# Patient Record
Sex: Female | Born: 1960 | Race: Black or African American | Hispanic: No | Marital: Single | Smoking: Former smoker
Health system: Southern US, Community
[De-identification: ages and names within clinical notes are randomized; demographics above are authoritative.]

## PROBLEM LIST (undated history)

## (undated) DIAGNOSIS — I10 Essential (primary) hypertension: Secondary | ICD-10-CM

## (undated) DIAGNOSIS — R000 Tachycardia, unspecified: Secondary | ICD-10-CM

## (undated) DIAGNOSIS — N3000 Acute cystitis without hematuria: Secondary | ICD-10-CM

## (undated) DIAGNOSIS — K219 Gastro-esophageal reflux disease without esophagitis: Secondary | ICD-10-CM

## (undated) DIAGNOSIS — G9341 Metabolic encephalopathy: Secondary | ICD-10-CM

## (undated) DIAGNOSIS — N390 Urinary tract infection, site not specified: Secondary | ICD-10-CM

## (undated) DIAGNOSIS — M1990 Unspecified osteoarthritis, unspecified site: Secondary | ICD-10-CM

## (undated) DIAGNOSIS — G629 Polyneuropathy, unspecified: Secondary | ICD-10-CM

## (undated) DIAGNOSIS — E7800 Pure hypercholesterolemia, unspecified: Secondary | ICD-10-CM

## (undated) DIAGNOSIS — K589 Irritable bowel syndrome without diarrhea: Secondary | ICD-10-CM

## (undated) DIAGNOSIS — M797 Fibromyalgia: Secondary | ICD-10-CM

## (undated) DIAGNOSIS — N319 Neuromuscular dysfunction of bladder, unspecified: Secondary | ICD-10-CM

## (undated) DIAGNOSIS — E559 Vitamin D deficiency, unspecified: Secondary | ICD-10-CM

## (undated) DIAGNOSIS — G35 Multiple sclerosis: Secondary | ICD-10-CM

## (undated) DIAGNOSIS — Z8614 Personal history of Methicillin resistant Staphylococcus aureus infection: Secondary | ICD-10-CM

## (undated) DIAGNOSIS — G35D Multiple sclerosis, unspecified: Secondary | ICD-10-CM

## (undated) DIAGNOSIS — B1920 Unspecified viral hepatitis C without hepatic coma: Secondary | ICD-10-CM

## (undated) DIAGNOSIS — G8929 Other chronic pain: Secondary | ICD-10-CM

## (undated) DIAGNOSIS — E669 Obesity, unspecified: Secondary | ICD-10-CM

## (undated) DIAGNOSIS — I639 Cerebral infarction, unspecified: Secondary | ICD-10-CM

## (undated) DIAGNOSIS — D649 Anemia, unspecified: Secondary | ICD-10-CM

## (undated) DIAGNOSIS — Z9682 Presence of neurostimulator: Secondary | ICD-10-CM

## (undated) HISTORY — PX: JOINT REPLACEMENT: SHX530

## (undated) HISTORY — PX: SACRAL NERVE STIMULATOR PLACEMENT: SHX2367

## (undated) HISTORY — PX: ABDOMINAL HYSTERECTOMY: SHX81

## (undated) HISTORY — PX: TUBAL LIGATION: SHX77

---

## 1999-05-26 ENCOUNTER — Encounter: Payer: Self-pay | Admitting: Emergency Medicine

## 1999-05-26 ENCOUNTER — Emergency Department (HOSPITAL_COMMUNITY): Admission: EM | Admit: 1999-05-26 | Discharge: 1999-05-26 | Payer: Self-pay | Admitting: Emergency Medicine

## 1999-06-10 ENCOUNTER — Encounter: Admission: RE | Admit: 1999-06-10 | Discharge: 1999-06-18 | Payer: Self-pay | Admitting: *Deleted

## 2007-10-23 ENCOUNTER — Inpatient Hospital Stay (HOSPITAL_COMMUNITY): Admission: EM | Admit: 2007-10-23 | Discharge: 2007-10-27 | Payer: Self-pay | Admitting: Emergency Medicine

## 2007-10-23 ENCOUNTER — Ambulatory Visit: Payer: Self-pay | Admitting: Infectious Disease

## 2007-11-04 ENCOUNTER — Encounter: Payer: Self-pay | Admitting: Internal Medicine

## 2007-11-08 ENCOUNTER — Encounter: Payer: Self-pay | Admitting: Internal Medicine

## 2007-11-08 ENCOUNTER — Ambulatory Visit: Payer: Self-pay | Admitting: Internal Medicine

## 2007-11-08 DIAGNOSIS — I10 Essential (primary) hypertension: Secondary | ICD-10-CM | POA: Insufficient documentation

## 2007-11-08 LAB — CONVERTED CEMR LAB
ALT: 34 units/L (ref 0–35)
AST: 27 units/L (ref 0–37)
Alkaline Phosphatase: 48 units/L (ref 39–117)
Creatinine, Ser: 0.76 mg/dL (ref 0.40–1.20)
Sodium: 139 meq/L (ref 135–145)
Total Bilirubin: 0.5 mg/dL (ref 0.3–1.2)
Total Protein: 6.9 g/dL (ref 6.0–8.3)

## 2007-11-09 ENCOUNTER — Telehealth: Payer: Self-pay | Admitting: Internal Medicine

## 2007-11-17 ENCOUNTER — Ambulatory Visit (HOSPITAL_COMMUNITY): Admission: RE | Admit: 2007-11-17 | Discharge: 2007-11-17 | Payer: Self-pay | Admitting: Infectious Diseases

## 2007-11-17 ENCOUNTER — Ambulatory Visit: Payer: Self-pay | Admitting: Infectious Diseases

## 2007-11-17 ENCOUNTER — Encounter (INDEPENDENT_AMBULATORY_CARE_PROVIDER_SITE_OTHER): Payer: Self-pay | Admitting: Internal Medicine

## 2007-11-17 ENCOUNTER — Telehealth: Payer: Self-pay | Admitting: *Deleted

## 2007-11-17 DIAGNOSIS — R000 Tachycardia, unspecified: Secondary | ICD-10-CM | POA: Insufficient documentation

## 2007-11-17 LAB — CONVERTED CEMR LAB
Basophils Relative: 0 % (ref 0–1)
Eosinophils Absolute: 0.1 10*3/uL (ref 0.0–0.7)
Eosinophils Relative: 1 % (ref 0–5)
HCT: 42.5 % (ref 36.0–46.0)
Lymphs Abs: 1.6 10*3/uL (ref 0.7–4.0)
MCHC: 33.4 g/dL (ref 30.0–36.0)
MCV: 88.5 fL (ref 78.0–100.0)
Platelets: 244 10*3/uL (ref 150–400)
RDW: 12.8 % (ref 11.5–15.5)
TSH: 0.427 microintl units/mL (ref 0.350–4.50)
WBC: 5.6 10*3/uL (ref 4.0–10.5)

## 2007-12-09 ENCOUNTER — Encounter: Payer: Self-pay | Admitting: Internal Medicine

## 2007-12-09 ENCOUNTER — Ambulatory Visit: Payer: Self-pay | Admitting: Internal Medicine

## 2007-12-09 DIAGNOSIS — G35 Multiple sclerosis: Secondary | ICD-10-CM | POA: Insufficient documentation

## 2007-12-09 DIAGNOSIS — R339 Retention of urine, unspecified: Secondary | ICD-10-CM | POA: Insufficient documentation

## 2007-12-09 LAB — CONVERTED CEMR LAB
CO2: 21 meq/L (ref 19–32)
Calcium: 9.2 mg/dL (ref 8.4–10.5)
Creatinine, Ser: 0.73 mg/dL (ref 0.40–1.20)

## 2007-12-12 ENCOUNTER — Encounter: Payer: Self-pay | Admitting: Internal Medicine

## 2007-12-15 ENCOUNTER — Telehealth: Payer: Self-pay | Admitting: Internal Medicine

## 2007-12-19 ENCOUNTER — Telehealth: Payer: Self-pay | Admitting: Infectious Diseases

## 2007-12-20 ENCOUNTER — Ambulatory Visit: Payer: Self-pay | Admitting: *Deleted

## 2007-12-20 ENCOUNTER — Encounter: Payer: Self-pay | Admitting: Internal Medicine

## 2007-12-20 LAB — CONVERTED CEMR LAB
Bilirubin Urine: NEGATIVE
Ketones, urine, test strip: NEGATIVE
Urobilinogen, UA: 0.2
pH: 5

## 2007-12-31 ENCOUNTER — Emergency Department (HOSPITAL_COMMUNITY): Admission: EM | Admit: 2007-12-31 | Discharge: 2007-12-31 | Payer: Self-pay | Admitting: Emergency Medicine

## 2008-01-09 ENCOUNTER — Encounter: Payer: Self-pay | Admitting: Internal Medicine

## 2008-01-10 ENCOUNTER — Ambulatory Visit: Payer: Self-pay | Admitting: Internal Medicine

## 2008-01-10 DIAGNOSIS — K219 Gastro-esophageal reflux disease without esophagitis: Secondary | ICD-10-CM | POA: Insufficient documentation

## 2008-01-12 ENCOUNTER — Encounter (INDEPENDENT_AMBULATORY_CARE_PROVIDER_SITE_OTHER): Payer: Self-pay | Admitting: *Deleted

## 2008-01-12 ENCOUNTER — Ambulatory Visit (HOSPITAL_COMMUNITY): Admission: RE | Admit: 2008-01-12 | Discharge: 2008-01-12 | Payer: Self-pay | Admitting: *Deleted

## 2008-01-15 ENCOUNTER — Emergency Department (HOSPITAL_COMMUNITY): Admission: EM | Admit: 2008-01-15 | Discharge: 2008-01-16 | Payer: Self-pay | Admitting: Family Medicine

## 2008-01-16 ENCOUNTER — Telehealth: Payer: Self-pay | Admitting: Internal Medicine

## 2008-01-18 ENCOUNTER — Telehealth: Payer: Self-pay | Admitting: *Deleted

## 2008-01-20 ENCOUNTER — Ambulatory Visit (HOSPITAL_COMMUNITY): Admission: RE | Admit: 2008-01-20 | Discharge: 2008-01-20 | Payer: Self-pay | Admitting: Neurology

## 2008-01-20 ENCOUNTER — Ambulatory Visit: Payer: Self-pay | Admitting: Vascular Surgery

## 2008-01-20 ENCOUNTER — Encounter (INDEPENDENT_AMBULATORY_CARE_PROVIDER_SITE_OTHER): Payer: Self-pay | Admitting: Neurology

## 2008-01-24 ENCOUNTER — Encounter: Payer: Self-pay | Admitting: Internal Medicine

## 2008-01-27 ENCOUNTER — Telehealth: Payer: Self-pay | Admitting: Internal Medicine

## 2008-01-31 ENCOUNTER — Telehealth (INDEPENDENT_AMBULATORY_CARE_PROVIDER_SITE_OTHER): Payer: Self-pay | Admitting: *Deleted

## 2008-02-01 ENCOUNTER — Ambulatory Visit: Payer: Self-pay | Admitting: Internal Medicine

## 2008-02-21 ENCOUNTER — Telehealth: Payer: Self-pay | Admitting: Internal Medicine

## 2008-02-24 ENCOUNTER — Ambulatory Visit: Payer: Self-pay | Admitting: Infectious Disease

## 2008-02-24 ENCOUNTER — Encounter: Payer: Self-pay | Admitting: Internal Medicine

## 2008-03-01 ENCOUNTER — Ambulatory Visit (HOSPITAL_COMMUNITY): Admission: RE | Admit: 2008-03-01 | Discharge: 2008-03-01 | Payer: Self-pay | Admitting: Internal Medicine

## 2008-03-06 LAB — CONVERTED CEMR LAB
Basophils Absolute: 0 10*3/uL (ref 0.0–0.1)
Basophils Relative: 0 % (ref 0–1)
Chloride: 105 meq/L (ref 96–112)
MCHC: 35.6 g/dL (ref 30.0–36.0)
Neutro Abs: 1.2 10*3/uL — ABNORMAL LOW (ref 1.7–7.7)
Neutrophils Relative %: 30 % — ABNORMAL LOW (ref 43–77)
Potassium: 3.8 meq/L (ref 3.5–5.3)
RBC: 5.03 M/uL (ref 3.87–5.11)
RDW: 12.4 % (ref 11.5–15.5)

## 2008-03-26 ENCOUNTER — Telehealth: Payer: Self-pay | Admitting: Internal Medicine

## 2008-03-26 ENCOUNTER — Ambulatory Visit: Payer: Self-pay | Admitting: *Deleted

## 2008-03-26 ENCOUNTER — Encounter: Payer: Self-pay | Admitting: Internal Medicine

## 2008-03-26 LAB — CONVERTED CEMR LAB
HDL: 68 mg/dL (ref >39)
LDL Cholesterol: 116 mg/dL — ABNORMAL HIGH (ref 0–99)
Triglycerides: 216 mg/dL — ABNORMAL HIGH (ref <150)
VLDL: 43 mg/dL — ABNORMAL HIGH (ref 0–40)

## 2008-03-30 ENCOUNTER — Encounter: Payer: Self-pay | Admitting: Internal Medicine

## 2008-05-22 ENCOUNTER — Encounter (INDEPENDENT_AMBULATORY_CARE_PROVIDER_SITE_OTHER): Payer: Self-pay | Admitting: Internal Medicine

## 2008-05-22 ENCOUNTER — Ambulatory Visit: Payer: Self-pay | Admitting: Internal Medicine

## 2008-05-22 ENCOUNTER — Encounter: Payer: Self-pay | Admitting: Internal Medicine

## 2008-05-22 DIAGNOSIS — G8929 Other chronic pain: Secondary | ICD-10-CM | POA: Insufficient documentation

## 2008-05-22 DIAGNOSIS — H538 Other visual disturbances: Secondary | ICD-10-CM | POA: Insufficient documentation

## 2008-05-22 LAB — CONVERTED CEMR LAB
Calcium: 9.8 mg/dL (ref 8.4–10.5)
Creatinine, Ser: 0.75 mg/dL (ref 0.40–1.20)

## 2008-05-26 ENCOUNTER — Telehealth (INDEPENDENT_AMBULATORY_CARE_PROVIDER_SITE_OTHER): Payer: Self-pay | Admitting: Internal Medicine

## 2008-06-07 ENCOUNTER — Telehealth: Payer: Self-pay | Admitting: Internal Medicine

## 2008-06-08 ENCOUNTER — Emergency Department (HOSPITAL_COMMUNITY): Admission: EM | Admit: 2008-06-08 | Discharge: 2008-06-08 | Payer: Self-pay | Admitting: Emergency Medicine

## 2008-06-18 ENCOUNTER — Encounter: Admission: RE | Admit: 2008-06-18 | Discharge: 2008-06-18 | Payer: Self-pay | Admitting: Neurology

## 2008-06-18 ENCOUNTER — Encounter: Payer: Self-pay | Admitting: Internal Medicine

## 2008-06-20 ENCOUNTER — Telehealth: Payer: Self-pay | Admitting: *Deleted

## 2008-06-26 ENCOUNTER — Telehealth: Payer: Self-pay | Admitting: Internal Medicine

## 2008-06-28 ENCOUNTER — Encounter: Payer: Self-pay | Admitting: Internal Medicine

## 2008-07-02 ENCOUNTER — Encounter: Payer: Self-pay | Admitting: Internal Medicine

## 2008-07-02 DIAGNOSIS — IMO0002 Reserved for concepts with insufficient information to code with codable children: Secondary | ICD-10-CM | POA: Insufficient documentation

## 2008-07-04 ENCOUNTER — Encounter
Admission: RE | Admit: 2008-07-04 | Discharge: 2008-10-02 | Payer: Self-pay | Admitting: Physical Medicine & Rehabilitation

## 2008-07-06 ENCOUNTER — Ambulatory Visit: Payer: Self-pay | Admitting: Physical Medicine & Rehabilitation

## 2008-07-16 ENCOUNTER — Telehealth: Payer: Self-pay | Admitting: Internal Medicine

## 2008-07-27 ENCOUNTER — Telehealth: Payer: Self-pay | Admitting: Internal Medicine

## 2008-08-06 ENCOUNTER — Ambulatory Visit: Payer: Self-pay | Admitting: Physical Medicine & Rehabilitation

## 2008-08-20 ENCOUNTER — Encounter
Admission: RE | Admit: 2008-08-20 | Discharge: 2008-08-23 | Payer: Self-pay | Admitting: Physical Medicine & Rehabilitation

## 2008-08-23 ENCOUNTER — Ambulatory Visit: Payer: Self-pay | Admitting: Physical Medicine & Rehabilitation

## 2008-08-28 ENCOUNTER — Encounter: Payer: Self-pay | Admitting: Internal Medicine

## 2008-08-28 ENCOUNTER — Ambulatory Visit: Payer: Self-pay | Admitting: Internal Medicine

## 2008-08-28 LAB — CONVERTED CEMR LAB
Chloride: 102 meq/L (ref 96–112)
Creatinine, Ser: 0.74 mg/dL (ref 0.40–1.20)
Eosinophils Relative: 2 % (ref 0–5)
GFR calc non Af Amer: >60 mL/min (ref >60)
HCT: 45 % (ref 36.0–46.0)
Lymphocytes Relative: 51 % — ABNORMAL HIGH (ref 12–46)
Lymphs Abs: 3.7 10*3/uL (ref 0.7–4.0)
Platelets: 290 10*3/uL (ref 150–400)
Potassium: 4.2 meq/L (ref 3.5–5.3)
WBC: 7.2 10*3/uL (ref 4.0–10.5)

## 2008-09-05 ENCOUNTER — Encounter: Payer: Self-pay | Admitting: Internal Medicine

## 2008-09-05 ENCOUNTER — Ambulatory Visit: Payer: Self-pay | Admitting: Internal Medicine

## 2008-09-05 LAB — CONVERTED CEMR LAB: TSH: 0.702 microintl units/mL (ref 0.350–4.500)

## 2008-09-19 ENCOUNTER — Ambulatory Visit: Payer: Self-pay | Admitting: Physical Medicine & Rehabilitation

## 2008-10-16 ENCOUNTER — Telehealth: Payer: Self-pay | Admitting: Internal Medicine

## 2008-10-21 ENCOUNTER — Emergency Department (HOSPITAL_COMMUNITY): Admission: EM | Admit: 2008-10-21 | Discharge: 2008-10-22 | Payer: Self-pay | Admitting: Emergency Medicine

## 2008-10-29 ENCOUNTER — Telehealth: Payer: Self-pay | Admitting: Internal Medicine

## 2008-11-01 ENCOUNTER — Ambulatory Visit: Payer: Self-pay | Admitting: Physical Medicine & Rehabilitation

## 2008-11-01 ENCOUNTER — Encounter
Admission: RE | Admit: 2008-11-01 | Discharge: 2008-11-01 | Payer: Self-pay | Admitting: Physical Medicine & Rehabilitation

## 2008-11-05 ENCOUNTER — Inpatient Hospital Stay (HOSPITAL_COMMUNITY): Admission: RE | Admit: 2008-11-05 | Discharge: 2008-11-06 | Payer: Self-pay | Admitting: Infectious Diseases

## 2008-11-05 ENCOUNTER — Ambulatory Visit: Payer: Self-pay | Admitting: Infectious Diseases

## 2008-11-05 ENCOUNTER — Encounter: Payer: Self-pay | Admitting: Internal Medicine

## 2008-11-05 ENCOUNTER — Ambulatory Visit: Payer: Self-pay | Admitting: Internal Medicine

## 2008-11-12 ENCOUNTER — Encounter: Payer: Self-pay | Admitting: Internal Medicine

## 2008-11-23 ENCOUNTER — Encounter
Admission: RE | Admit: 2008-11-23 | Discharge: 2009-02-21 | Payer: Self-pay | Admitting: Physical Medicine & Rehabilitation

## 2008-11-23 ENCOUNTER — Ambulatory Visit: Payer: Self-pay | Admitting: Physical Medicine & Rehabilitation

## 2008-11-28 ENCOUNTER — Ambulatory Visit: Payer: Self-pay | Admitting: Internal Medicine

## 2008-11-28 ENCOUNTER — Encounter: Payer: Self-pay | Admitting: Internal Medicine

## 2008-11-28 DIAGNOSIS — Z862 Personal history of diseases of the blood and blood-forming organs and certain disorders involving the immune mechanism: Secondary | ICD-10-CM | POA: Insufficient documentation

## 2008-11-28 DIAGNOSIS — Z8639 Personal history of other endocrine, nutritional and metabolic disease: Secondary | ICD-10-CM

## 2008-11-28 LAB — CONVERTED CEMR LAB
Albumin: 3.6 g/dL (ref 3.5–5.2)
CO2: 25 meq/L (ref 19–32)
Calcium: 9.3 mg/dL (ref 8.4–10.5)
Creatinine, Ser: 0.7 mg/dL (ref 0.40–1.20)
Glucose, Bld: 128 mg/dL — ABNORMAL HIGH (ref 70–99)
Hgb A1c MFr Bld: 6.3 %
Indirect Bilirubin: 0.5 mg/dL (ref 0.0–0.9)
Total Bilirubin: 0.6 mg/dL (ref 0.3–1.2)
Total Protein: 7.3 g/dL (ref 6.0–8.3)

## 2008-12-21 ENCOUNTER — Ambulatory Visit: Payer: Self-pay | Admitting: Physical Medicine & Rehabilitation

## 2009-01-07 ENCOUNTER — Ambulatory Visit: Payer: Self-pay | Admitting: Internal Medicine

## 2009-01-10 ENCOUNTER — Telehealth: Payer: Self-pay | Admitting: Internal Medicine

## 2009-01-17 ENCOUNTER — Telehealth: Payer: Self-pay | Admitting: Internal Medicine

## 2009-01-18 ENCOUNTER — Ambulatory Visit: Payer: Self-pay | Admitting: Physical Medicine & Rehabilitation

## 2009-01-23 ENCOUNTER — Telehealth: Payer: Self-pay | Admitting: Internal Medicine

## 2009-01-28 ENCOUNTER — Telehealth: Payer: Self-pay | Admitting: Internal Medicine

## 2009-01-30 ENCOUNTER — Encounter: Payer: Self-pay | Admitting: Advanced Practice Midwife

## 2009-01-30 ENCOUNTER — Ambulatory Visit: Payer: Self-pay | Admitting: Obstetrics & Gynecology

## 2009-01-31 ENCOUNTER — Ambulatory Visit: Payer: Self-pay | Admitting: Internal Medicine

## 2009-02-01 ENCOUNTER — Encounter: Payer: Self-pay | Admitting: Internal Medicine

## 2009-02-01 ENCOUNTER — Emergency Department (HOSPITAL_COMMUNITY): Admission: EM | Admit: 2009-02-01 | Discharge: 2009-02-01 | Payer: Self-pay | Admitting: Emergency Medicine

## 2009-02-04 ENCOUNTER — Telehealth: Payer: Self-pay | Admitting: Internal Medicine

## 2009-02-05 ENCOUNTER — Ambulatory Visit: Payer: Self-pay | Admitting: Internal Medicine

## 2009-02-05 LAB — CONVERTED CEMR LAB
Chloride: 103 meq/L (ref 96–112)
Potassium: 3.7 meq/L (ref 3.5–5.3)

## 2009-02-06 ENCOUNTER — Ambulatory Visit (HOSPITAL_COMMUNITY): Admission: RE | Admit: 2009-02-06 | Discharge: 2009-02-06 | Payer: Self-pay | Admitting: Obstetrics and Gynecology

## 2009-02-12 ENCOUNTER — Ambulatory Visit: Payer: Self-pay | Admitting: Infectious Diseases

## 2009-02-12 DIAGNOSIS — G4700 Insomnia, unspecified: Secondary | ICD-10-CM | POA: Insufficient documentation

## 2009-02-13 ENCOUNTER — Encounter
Admission: RE | Admit: 2009-02-13 | Discharge: 2009-05-14 | Payer: Self-pay | Admitting: Physical Medicine & Rehabilitation

## 2009-02-14 ENCOUNTER — Ambulatory Visit: Payer: Self-pay | Admitting: Obstetrics and Gynecology

## 2009-02-18 ENCOUNTER — Telehealth: Payer: Self-pay | Admitting: *Deleted

## 2009-02-18 ENCOUNTER — Ambulatory Visit: Payer: Self-pay | Admitting: Internal Medicine

## 2009-02-18 ENCOUNTER — Encounter (INDEPENDENT_AMBULATORY_CARE_PROVIDER_SITE_OTHER): Payer: Self-pay | Admitting: Dermatology

## 2009-02-18 ENCOUNTER — Inpatient Hospital Stay (HOSPITAL_COMMUNITY): Admission: AD | Admit: 2009-02-18 | Discharge: 2009-02-22 | Payer: Self-pay | Admitting: Internal Medicine

## 2009-02-18 DIAGNOSIS — E118 Type 2 diabetes mellitus with unspecified complications: Secondary | ICD-10-CM | POA: Insufficient documentation

## 2009-02-18 DIAGNOSIS — E119 Type 2 diabetes mellitus without complications: Secondary | ICD-10-CM | POA: Insufficient documentation

## 2009-02-22 ENCOUNTER — Encounter (INDEPENDENT_AMBULATORY_CARE_PROVIDER_SITE_OTHER): Payer: Self-pay | Admitting: Internal Medicine

## 2009-02-24 ENCOUNTER — Telehealth (INDEPENDENT_AMBULATORY_CARE_PROVIDER_SITE_OTHER): Payer: Self-pay | Admitting: Internal Medicine

## 2009-02-28 ENCOUNTER — Telehealth (INDEPENDENT_AMBULATORY_CARE_PROVIDER_SITE_OTHER): Payer: Self-pay | Admitting: Internal Medicine

## 2009-03-02 ENCOUNTER — Encounter: Admission: RE | Admit: 2009-03-02 | Discharge: 2009-03-02 | Payer: Self-pay | Admitting: Internal Medicine

## 2009-03-03 ENCOUNTER — Encounter: Payer: Self-pay | Admitting: Internal Medicine

## 2009-03-03 ENCOUNTER — Encounter (INDEPENDENT_AMBULATORY_CARE_PROVIDER_SITE_OTHER): Payer: Self-pay | Admitting: Internal Medicine

## 2009-03-04 ENCOUNTER — Ambulatory Visit (HOSPITAL_COMMUNITY): Admission: RE | Admit: 2009-03-04 | Discharge: 2009-03-04 | Payer: Self-pay | Admitting: Internal Medicine

## 2009-03-04 ENCOUNTER — Ambulatory Visit: Payer: Self-pay | Admitting: Internal Medicine

## 2009-03-04 ENCOUNTER — Encounter: Payer: Self-pay | Admitting: Internal Medicine

## 2009-03-04 LAB — CONVERTED CEMR LAB: Blood Glucose, Fingerstick: 331

## 2009-03-05 ENCOUNTER — Telehealth: Payer: Self-pay | Admitting: Internal Medicine

## 2009-03-12 ENCOUNTER — Encounter: Admission: RE | Admit: 2009-03-12 | Discharge: 2009-03-12 | Payer: Self-pay | Admitting: Internal Medicine

## 2009-03-13 ENCOUNTER — Ambulatory Visit (HOSPITAL_COMMUNITY): Admission: RE | Admit: 2009-03-13 | Discharge: 2009-03-13 | Payer: Self-pay | Admitting: Obstetrics and Gynecology

## 2009-03-15 ENCOUNTER — Ambulatory Visit: Payer: Self-pay | Admitting: Physical Medicine & Rehabilitation

## 2009-03-15 ENCOUNTER — Telehealth: Payer: Self-pay | Admitting: Internal Medicine

## 2009-03-18 ENCOUNTER — Telehealth: Payer: Self-pay | Admitting: Internal Medicine

## 2009-03-21 ENCOUNTER — Encounter: Payer: Self-pay | Admitting: Internal Medicine

## 2009-03-22 ENCOUNTER — Ambulatory Visit: Payer: Self-pay | Admitting: Obstetrics & Gynecology

## 2009-04-03 ENCOUNTER — Encounter: Payer: Self-pay | Admitting: Internal Medicine

## 2009-04-03 ENCOUNTER — Ambulatory Visit: Payer: Self-pay | Admitting: Internal Medicine

## 2009-04-03 LAB — CONVERTED CEMR LAB
ALT: 38 units/L — ABNORMAL HIGH (ref 0–35)
AST: 41 units/L — ABNORMAL HIGH (ref 0–37)
Alkaline Phosphatase: 89 units/L (ref 39–117)
Blood Glucose, Fingerstick: 91
Creatinine, Ser: 0.7 mg/dL (ref 0.40–1.20)
Total Bilirubin: 0.6 mg/dL (ref 0.3–1.2)

## 2009-04-04 ENCOUNTER — Telehealth: Payer: Self-pay | Admitting: Licensed Clinical Social Worker

## 2009-04-04 ENCOUNTER — Encounter: Payer: Self-pay | Admitting: Internal Medicine

## 2009-04-04 ENCOUNTER — Encounter: Payer: Self-pay | Admitting: Licensed Clinical Social Worker

## 2009-04-08 ENCOUNTER — Telehealth: Payer: Self-pay | Admitting: Internal Medicine

## 2009-04-15 ENCOUNTER — Ambulatory Visit: Payer: Self-pay | Admitting: Physical Medicine & Rehabilitation

## 2009-05-03 ENCOUNTER — Ambulatory Visit: Payer: Self-pay | Admitting: Infectious Diseases

## 2009-05-03 ENCOUNTER — Ambulatory Visit: Payer: Self-pay | Admitting: Internal Medicine

## 2009-05-03 DIAGNOSIS — N3000 Acute cystitis without hematuria: Secondary | ICD-10-CM | POA: Insufficient documentation

## 2009-05-03 LAB — CONVERTED CEMR LAB
Creatinine, Urine: 25.5 mg/dL
Microalb Creat Ratio: 19.6 mg/g (ref 0.0–30.0)

## 2009-05-14 ENCOUNTER — Ambulatory Visit: Payer: Self-pay | Admitting: Physical Medicine & Rehabilitation

## 2009-05-15 ENCOUNTER — Encounter: Payer: Self-pay | Admitting: Internal Medicine

## 2009-06-12 ENCOUNTER — Ambulatory Visit: Payer: Self-pay | Admitting: Internal Medicine

## 2009-06-12 LAB — CONVERTED CEMR LAB

## 2009-06-13 ENCOUNTER — Telehealth (INDEPENDENT_AMBULATORY_CARE_PROVIDER_SITE_OTHER): Payer: Self-pay | Admitting: *Deleted

## 2009-06-13 ENCOUNTER — Encounter
Admission: RE | Admit: 2009-06-13 | Discharge: 2009-09-11 | Payer: Self-pay | Admitting: Physical Medicine & Rehabilitation

## 2009-06-17 ENCOUNTER — Ambulatory Visit: Payer: Self-pay | Admitting: Physical Medicine & Rehabilitation

## 2009-06-19 ENCOUNTER — Telehealth: Payer: Self-pay | Admitting: Internal Medicine

## 2009-06-20 ENCOUNTER — Encounter
Admission: RE | Admit: 2009-06-20 | Discharge: 2009-08-08 | Payer: Self-pay | Admitting: Physical Medicine & Rehabilitation

## 2009-07-01 ENCOUNTER — Ambulatory Visit: Payer: Self-pay | Admitting: Physical Medicine & Rehabilitation

## 2009-07-11 ENCOUNTER — Ambulatory Visit: Payer: Self-pay | Admitting: Physical Medicine & Rehabilitation

## 2009-07-24 ENCOUNTER — Telehealth: Payer: Self-pay | Admitting: Internal Medicine

## 2009-07-29 ENCOUNTER — Telehealth: Payer: Self-pay | Admitting: Internal Medicine

## 2009-07-31 ENCOUNTER — Telehealth: Payer: Self-pay | Admitting: Internal Medicine

## 2009-08-01 ENCOUNTER — Telehealth: Payer: Self-pay | Admitting: Internal Medicine

## 2009-08-05 ENCOUNTER — Ambulatory Visit: Payer: Self-pay | Admitting: Internal Medicine

## 2009-08-05 ENCOUNTER — Encounter: Payer: Self-pay | Admitting: Internal Medicine

## 2009-08-05 DIAGNOSIS — R21 Rash and other nonspecific skin eruption: Secondary | ICD-10-CM | POA: Insufficient documentation

## 2009-08-05 LAB — CONVERTED CEMR LAB
BUN: 10 mg/dL (ref 6–23)
Chloride: 104 meq/L (ref 96–112)
Creatinine, Ser: 0.92 mg/dL (ref 0.40–1.20)
Hgb A1c MFr Bld: 5.8 %

## 2009-08-08 ENCOUNTER — Encounter
Admission: RE | Admit: 2009-08-08 | Discharge: 2009-08-12 | Payer: Self-pay | Admitting: Physical Medicine & Rehabilitation

## 2009-08-08 ENCOUNTER — Telehealth: Payer: Self-pay | Admitting: Internal Medicine

## 2009-08-08 ENCOUNTER — Encounter: Payer: Self-pay | Admitting: Internal Medicine

## 2009-08-08 ENCOUNTER — Ambulatory Visit: Payer: Self-pay | Admitting: Internal Medicine

## 2009-08-08 LAB — CONVERTED CEMR LAB: Blood Glucose, Home Monitor: 4 mg/dL

## 2009-08-09 ENCOUNTER — Telehealth: Payer: Self-pay | Admitting: Internal Medicine

## 2009-08-12 ENCOUNTER — Ambulatory Visit: Payer: Self-pay | Admitting: Physical Medicine & Rehabilitation

## 2009-08-19 ENCOUNTER — Ambulatory Visit: Payer: Self-pay | Admitting: Internal Medicine

## 2009-08-21 ENCOUNTER — Ambulatory Visit (HOSPITAL_COMMUNITY): Admission: RE | Admit: 2009-08-21 | Discharge: 2009-08-21 | Payer: Self-pay | Admitting: Internal Medicine

## 2009-08-29 ENCOUNTER — Encounter: Payer: Self-pay | Admitting: Internal Medicine

## 2009-09-04 ENCOUNTER — Ambulatory Visit: Payer: Self-pay | Admitting: Physical Medicine & Rehabilitation

## 2009-09-17 ENCOUNTER — Ambulatory Visit: Payer: Self-pay | Admitting: Internal Medicine

## 2009-09-26 ENCOUNTER — Encounter
Admission: RE | Admit: 2009-09-26 | Discharge: 2009-12-25 | Payer: Self-pay | Admitting: Physical Medicine & Rehabilitation

## 2009-09-26 ENCOUNTER — Encounter
Admission: RE | Admit: 2009-09-26 | Discharge: 2009-11-05 | Payer: Self-pay | Admitting: Physical Medicine & Rehabilitation

## 2009-10-03 ENCOUNTER — Ambulatory Visit: Payer: Self-pay | Admitting: Physical Medicine & Rehabilitation

## 2009-10-08 ENCOUNTER — Encounter: Payer: Self-pay | Admitting: Internal Medicine

## 2009-10-08 ENCOUNTER — Emergency Department (HOSPITAL_COMMUNITY): Admission: EM | Admit: 2009-10-08 | Discharge: 2009-10-08 | Payer: Self-pay | Admitting: Emergency Medicine

## 2009-10-08 ENCOUNTER — Emergency Department (HOSPITAL_COMMUNITY): Admission: EM | Admit: 2009-10-08 | Discharge: 2009-10-08 | Payer: Self-pay | Admitting: Family Medicine

## 2009-10-11 ENCOUNTER — Telehealth (INDEPENDENT_AMBULATORY_CARE_PROVIDER_SITE_OTHER): Payer: Self-pay | Admitting: *Deleted

## 2009-10-24 ENCOUNTER — Encounter: Admission: RE | Admit: 2009-10-24 | Discharge: 2009-10-24 | Payer: Self-pay | Admitting: Neurology

## 2009-11-01 ENCOUNTER — Encounter
Admission: RE | Admit: 2009-11-01 | Discharge: 2010-01-23 | Payer: Self-pay | Admitting: Physical Medicine & Rehabilitation

## 2009-11-03 IMAGING — US US TRANSVAGINAL NON-OB
1 series · 14 of 25 positions shown · non-contrast
Comparison: 02/04/2009

CLINICAL DATA: Follow-up complex left ovarian cyst.  Previous
hysterectomy.

TRANSVAGINAL ULTRASOUND OF PELVIS
TECHNIQUE: Transvaginal ultrasound examination of the pelvis was
performed including evaluation of the uterus, ovaries, adnexal
regions, and pelvic cul-de-sac.

[Series 1: us transvaginal non-ob · 38 acquisitions, 14 frames shown]
[im 1/38]
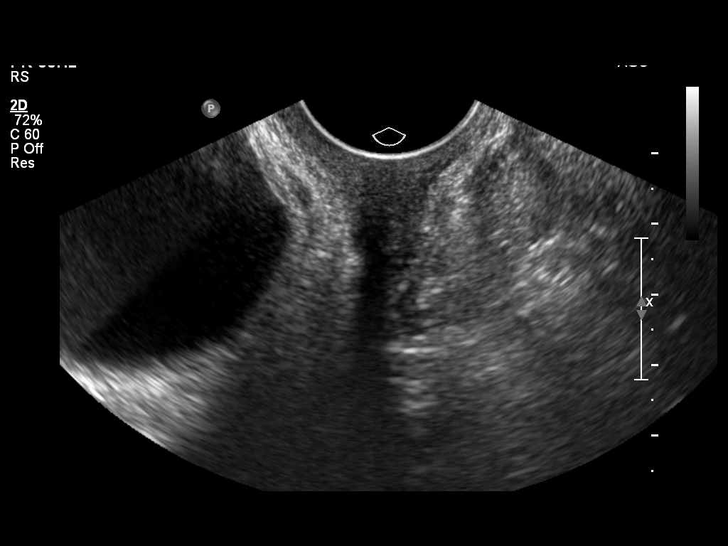
[im 4/38]
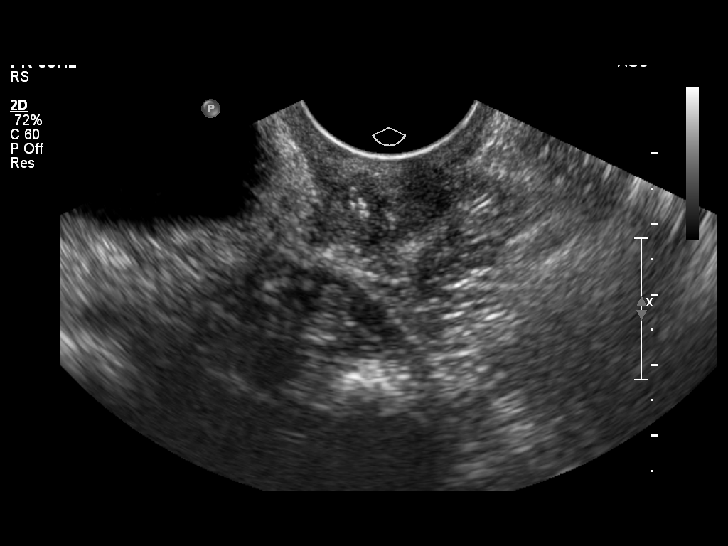
[im 7/38]
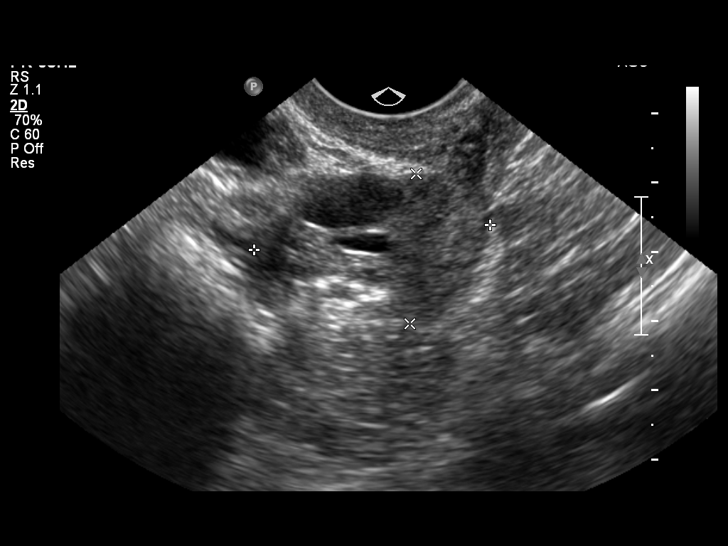
[im 10/38]
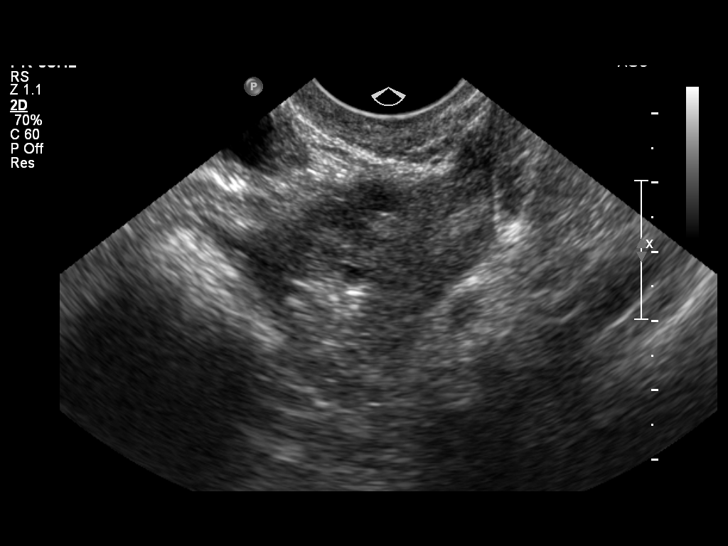
[im 13/38]
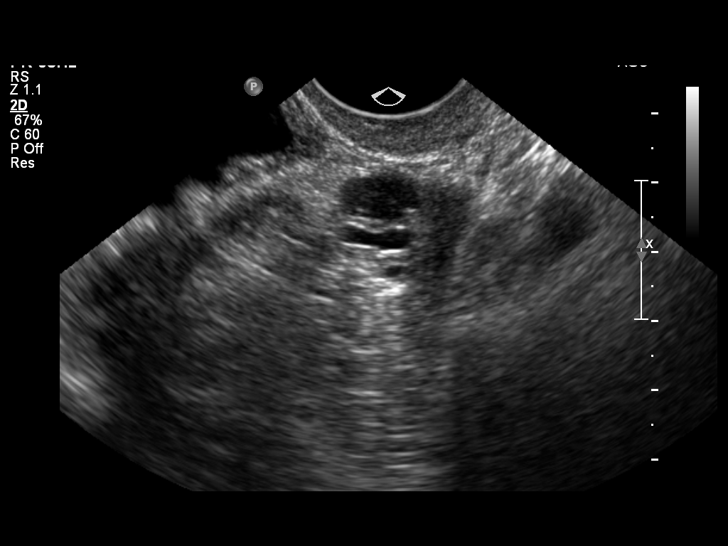
[im 14/38]
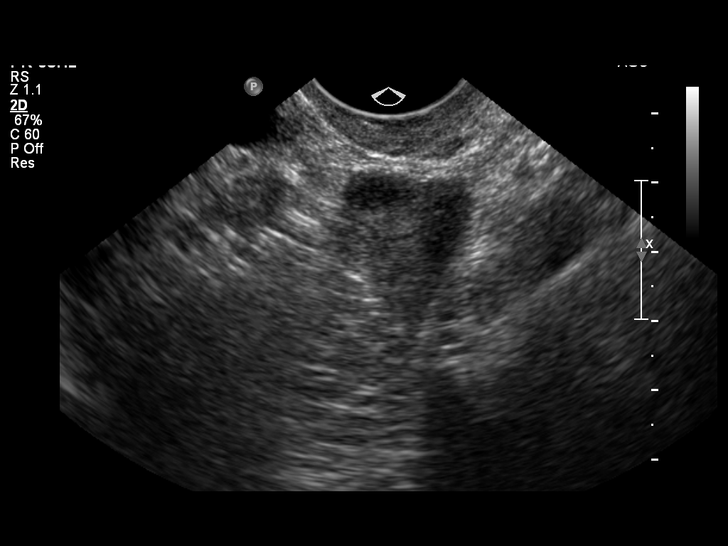
[im 17/38]
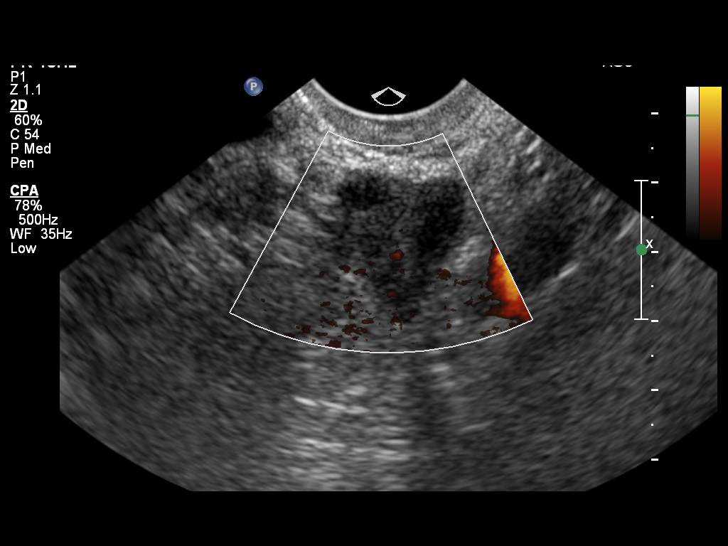
[im 21/38]
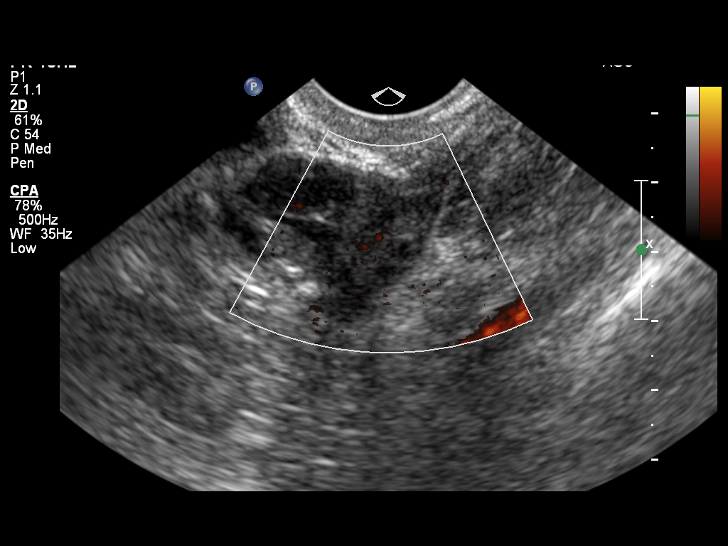
[im 24/38]
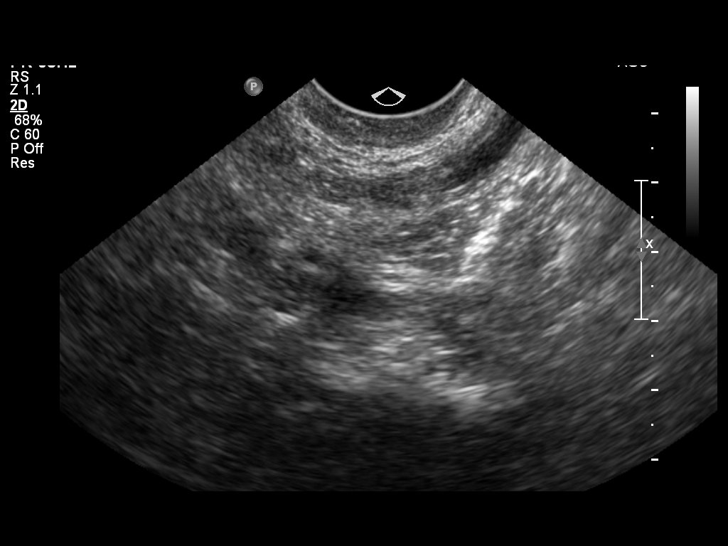
[im 25/38]
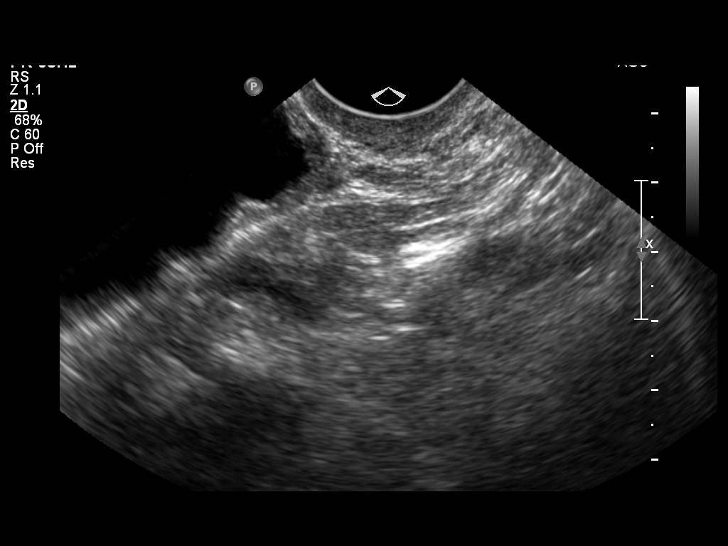
[im 28/38]
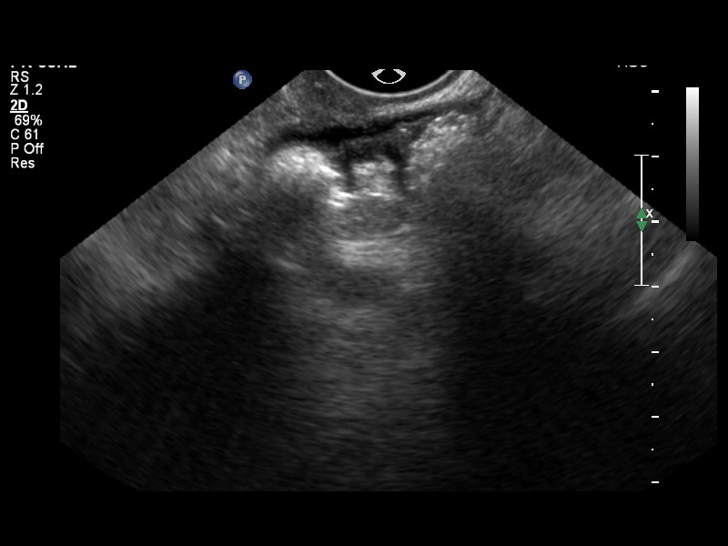
[im 31/38]
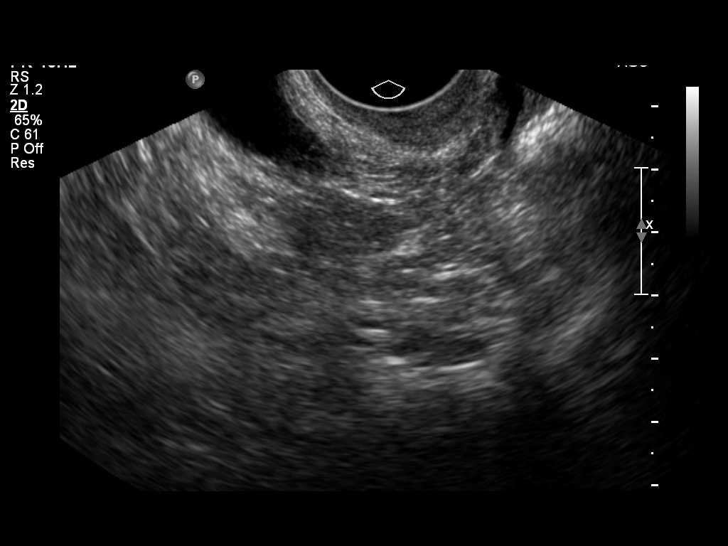
[im 34/38]
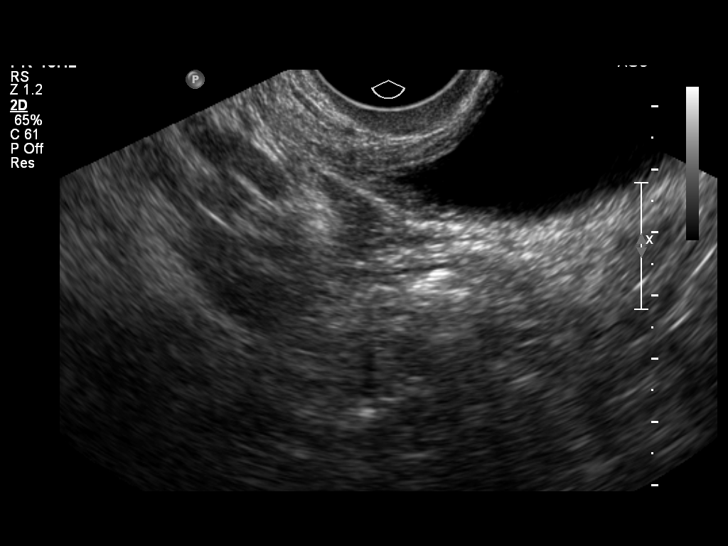
[im 38/38]
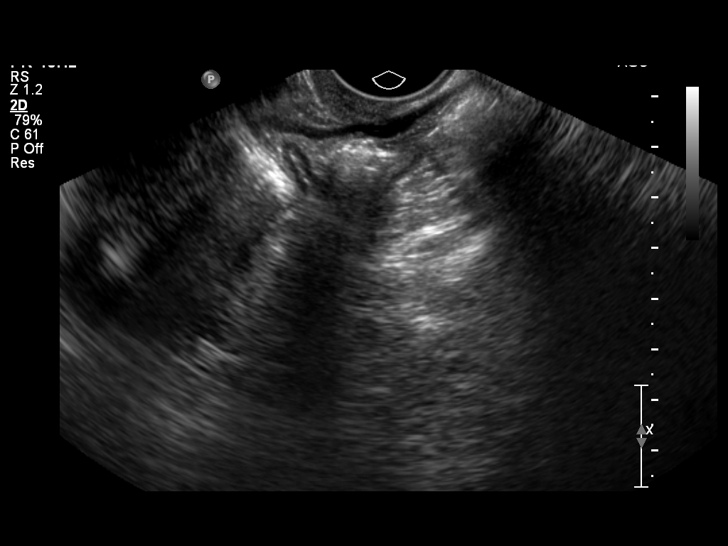

[14 of 25 positions shown; findings below may reference images not displayed]

FINDINGS: Uterus has been surgically removed.  Vaginal cuff is unremarkable
appearance.

Right Ovary measures 2.3 x 1.1 x 1.0 cm.  Normal appearance.

Left Ovary measures 3.4 x 2.2 x 2.1 cm.  Decreased size of the
central cystic area within corpus luteum noted since prior
exam.This now measures 0.7 x 0.7 x 1.3 cm compared with 1.1 x 0.9 x
1.4 cm on prior exam.

Other Findings:  No adnexal mass or free fluid identified.
IMPRESSION: Resolving left ovarian corpus luteum.  No evidence of adnexal mass
or free fluid.

## 2009-11-11 ENCOUNTER — Ambulatory Visit: Payer: Self-pay | Admitting: Physical Medicine & Rehabilitation

## 2009-11-12 ENCOUNTER — Ambulatory Visit: Payer: Self-pay | Admitting: Physical Medicine & Rehabilitation

## 2009-11-14 ENCOUNTER — Ambulatory Visit: Payer: Self-pay | Admitting: Physical Medicine & Rehabilitation

## 2009-11-28 ENCOUNTER — Encounter: Payer: Self-pay | Admitting: Internal Medicine

## 2009-11-28 ENCOUNTER — Ambulatory Visit: Payer: Self-pay | Admitting: Internal Medicine

## 2009-12-03 ENCOUNTER — Encounter
Admission: RE | Admit: 2009-12-03 | Discharge: 2010-02-27 | Payer: Self-pay | Admitting: Physical Medicine & Rehabilitation

## 2009-12-09 ENCOUNTER — Telehealth (INDEPENDENT_AMBULATORY_CARE_PROVIDER_SITE_OTHER): Payer: Self-pay | Admitting: *Deleted

## 2009-12-12 ENCOUNTER — Ambulatory Visit: Payer: Self-pay | Admitting: Physical Medicine & Rehabilitation

## 2009-12-13 ENCOUNTER — Telehealth: Payer: Self-pay | Admitting: Internal Medicine

## 2009-12-24 ENCOUNTER — Encounter: Admission: RE | Admit: 2009-12-24 | Discharge: 2009-12-24 | Payer: Self-pay | Admitting: Neurology

## 2010-01-09 ENCOUNTER — Ambulatory Visit: Payer: Self-pay | Admitting: Physical Medicine & Rehabilitation

## 2010-01-13 ENCOUNTER — Telehealth: Payer: Self-pay | Admitting: Internal Medicine

## 2010-01-17 ENCOUNTER — Telehealth: Payer: Self-pay | Admitting: Internal Medicine

## 2010-01-18 ENCOUNTER — Emergency Department (HOSPITAL_COMMUNITY): Admission: EM | Admit: 2010-01-18 | Discharge: 2010-01-18 | Payer: Self-pay | Admitting: Emergency Medicine

## 2010-01-23 ENCOUNTER — Ambulatory Visit: Payer: Self-pay | Admitting: Physical Medicine & Rehabilitation

## 2010-01-31 ENCOUNTER — Ambulatory Visit: Payer: Self-pay | Admitting: Physical Medicine & Rehabilitation

## 2010-02-10 ENCOUNTER — Telehealth (INDEPENDENT_AMBULATORY_CARE_PROVIDER_SITE_OTHER): Payer: Self-pay | Admitting: *Deleted

## 2010-02-18 ENCOUNTER — Encounter: Admission: RE | Admit: 2010-02-18 | Discharge: 2010-03-26 | Payer: Self-pay | Admitting: Neurology

## 2010-02-27 ENCOUNTER — Encounter
Admission: RE | Admit: 2010-02-27 | Discharge: 2010-05-17 | Payer: Self-pay | Source: Home / Self Care | Attending: Physical Medicine & Rehabilitation | Admitting: Physical Medicine & Rehabilitation

## 2010-03-07 ENCOUNTER — Ambulatory Visit: Payer: Self-pay | Admitting: Physical Medicine & Rehabilitation

## 2010-03-11 ENCOUNTER — Encounter: Payer: Self-pay | Admitting: Physical Medicine & Rehabilitation

## 2010-03-11 ENCOUNTER — Ambulatory Visit (HOSPITAL_COMMUNITY)
Admission: RE | Admit: 2010-03-11 | Discharge: 2010-03-11 | Payer: Self-pay | Admitting: Physical Medicine & Rehabilitation

## 2010-03-11 ENCOUNTER — Ambulatory Visit: Payer: Self-pay | Admitting: Surgery

## 2010-03-17 ENCOUNTER — Telehealth (INDEPENDENT_AMBULATORY_CARE_PROVIDER_SITE_OTHER): Payer: Self-pay | Admitting: *Deleted

## 2010-03-17 ENCOUNTER — Ambulatory Visit: Payer: Self-pay | Admitting: Physical Medicine & Rehabilitation

## 2010-03-25 ENCOUNTER — Ambulatory Visit: Payer: Self-pay | Admitting: Physical Medicine & Rehabilitation

## 2010-03-28 ENCOUNTER — Ambulatory Visit: Payer: Self-pay | Admitting: Surgery

## 2010-03-28 ENCOUNTER — Ambulatory Visit
Admission: RE | Admit: 2010-03-28 | Discharge: 2010-03-28 | Payer: Self-pay | Source: Home / Self Care | Admitting: Emergency Medicine

## 2010-03-28 ENCOUNTER — Encounter: Payer: Self-pay | Admitting: Emergency Medicine

## 2010-04-03 ENCOUNTER — Ambulatory Visit: Payer: Self-pay | Admitting: Physical Medicine & Rehabilitation

## 2010-04-22 ENCOUNTER — Ambulatory Visit: Payer: Self-pay | Admitting: Physical Medicine & Rehabilitation

## 2010-04-23 ENCOUNTER — Ambulatory Visit (HOSPITAL_COMMUNITY): Admission: RE | Admit: 2010-04-23 | Payer: Self-pay | Admitting: Internal Medicine

## 2010-04-26 ENCOUNTER — Ambulatory Visit
Admission: RE | Admit: 2010-04-26 | Discharge: 2010-04-26 | Payer: Self-pay | Source: Home / Self Care | Attending: Emergency Medicine | Admitting: Emergency Medicine

## 2010-04-26 ENCOUNTER — Encounter: Payer: Self-pay | Admitting: Emergency Medicine

## 2010-05-16 ENCOUNTER — Encounter (HOSPITAL_COMMUNITY)
Admission: RE | Admit: 2010-05-16 | Discharge: 2010-06-17 | Payer: Self-pay | Source: Home / Self Care | Attending: Neurology | Admitting: Neurology

## 2010-05-20 ENCOUNTER — Encounter
Admission: RE | Admit: 2010-05-20 | Discharge: 2010-05-22 | Payer: Self-pay | Source: Home / Self Care | Attending: Physical Medicine & Rehabilitation | Admitting: Physical Medicine & Rehabilitation

## 2010-05-20 ENCOUNTER — Encounter
Admission: RE | Admit: 2010-05-20 | Payer: Self-pay | Source: Home / Self Care | Admitting: Physical Medicine & Rehabilitation

## 2010-05-21 ENCOUNTER — Ambulatory Visit (HOSPITAL_COMMUNITY)
Admission: RE | Admit: 2010-05-21 | Discharge: 2010-05-21 | Payer: Self-pay | Source: Home / Self Care | Attending: Gastroenterology | Admitting: Gastroenterology

## 2010-05-21 LAB — GLUCOSE, CAPILLARY
Glucose-Capillary: 91 mg/dL (ref 70–99)
Glucose-Capillary: 86 mg/dL (ref 70–99)

## 2010-05-22 ENCOUNTER — Ambulatory Visit
Admission: RE | Admit: 2010-05-22 | Discharge: 2010-05-22 | Payer: Self-pay | Source: Home / Self Care | Attending: Physical Medicine & Rehabilitation | Admitting: Physical Medicine & Rehabilitation

## 2010-05-28 ENCOUNTER — Encounter: Payer: Self-pay | Admitting: Cardiology

## 2010-06-02 ENCOUNTER — Ambulatory Visit
Admission: RE | Admit: 2010-06-02 | Discharge: 2010-06-02 | Payer: Self-pay | Source: Home / Self Care | Attending: Internal Medicine | Admitting: Internal Medicine

## 2010-06-02 DIAGNOSIS — R0602 Shortness of breath: Secondary | ICD-10-CM | POA: Insufficient documentation

## 2010-06-02 DIAGNOSIS — J410 Simple chronic bronchitis: Secondary | ICD-10-CM | POA: Insufficient documentation

## 2010-06-02 DIAGNOSIS — F17200 Nicotine dependence, unspecified, uncomplicated: Secondary | ICD-10-CM | POA: Insufficient documentation

## 2010-06-06 ENCOUNTER — Encounter: Payer: Self-pay | Admitting: Cardiology

## 2010-06-06 ENCOUNTER — Ambulatory Visit
Admission: RE | Admit: 2010-06-06 | Discharge: 2010-06-06 | Payer: Self-pay | Source: Home / Self Care | Attending: Cardiology | Admitting: Cardiology

## 2010-06-08 ENCOUNTER — Encounter: Payer: Self-pay | Admitting: Internal Medicine

## 2010-06-08 ENCOUNTER — Encounter: Payer: Self-pay | Admitting: Neurology

## 2010-06-11 ENCOUNTER — Emergency Department (HOSPITAL_COMMUNITY)
Admission: EM | Admit: 2010-06-11 | Discharge: 2010-06-11 | Payer: Self-pay | Source: Home / Self Care | Admitting: Emergency Medicine

## 2010-06-11 LAB — POCT CARDIAC MARKERS
CKMB, poc: 1.5 ng/mL (ref 1.0–8.0)
Myoglobin, poc: 66.6 ng/mL (ref 12–200)
Myoglobin, poc: 98.4 ng/mL (ref 12–200)
Troponin i, poc: <0.05 ng/mL (ref 0.00–0.09)

## 2010-06-11 LAB — DIFFERENTIAL
Eosinophils Absolute: 0.1 10*3/uL (ref 0.0–0.7)
Lymphs Abs: 4.8 10*3/uL — ABNORMAL HIGH (ref 0.7–4.0)
Monocytes Absolute: 0.8 10*3/uL (ref 0.1–1.0)
Neutrophils Relative %: 19 % — ABNORMAL LOW (ref 43–77)

## 2010-06-11 LAB — GLUCOSE, CAPILLARY
Glucose-Capillary: 110 mg/dL — ABNORMAL HIGH (ref 70–99)
Glucose-Capillary: 88 mg/dL (ref 70–99)

## 2010-06-11 LAB — URINALYSIS, ROUTINE W REFLEX MICROSCOPIC
Bilirubin Urine: NEGATIVE
Hgb urine dipstick: NEGATIVE
Specific Gravity, Urine: 1.013 (ref 1.005–1.030)
Urine Glucose, Fasting: NEGATIVE mg/dL

## 2010-06-11 LAB — BASIC METABOLIC PANEL
CO2: 25 mEq/L (ref 19–32)
Calcium: 9 mg/dL (ref 8.4–10.5)
Creatinine, Ser: 0.74 mg/dL (ref 0.4–1.2)
GFR calc Af Amer: >60 mL/min (ref >60)
GFR calc non Af Amer: >60 mL/min (ref >60)
Glucose, Bld: 92 mg/dL (ref 70–99)
Sodium: 143 mEq/L (ref 135–145)

## 2010-06-11 LAB — CBC
Hemoglobin: 14.1 g/dL (ref 12.0–15.0)
MCH: 29.2 pg (ref 26.0–34.0)
MCHC: 35.3 g/dL (ref 30.0–36.0)
RDW: 12.9 % (ref 11.5–15.5)

## 2010-06-12 ENCOUNTER — Telehealth (INDEPENDENT_AMBULATORY_CARE_PROVIDER_SITE_OTHER): Payer: Self-pay | Admitting: *Deleted

## 2010-06-13 ENCOUNTER — Ambulatory Visit: Admission: RE | Admit: 2010-06-13 | Discharge: 2010-06-13 | Payer: Self-pay | Source: Home / Self Care

## 2010-06-13 ENCOUNTER — Encounter: Payer: Self-pay | Admitting: Cardiology

## 2010-06-16 ENCOUNTER — Ambulatory Visit (HOSPITAL_COMMUNITY)
Admission: RE | Admit: 2010-06-16 | Discharge: 2010-06-16 | Payer: Self-pay | Source: Home / Self Care | Attending: Cardiology | Admitting: Cardiology

## 2010-06-16 ENCOUNTER — Encounter: Payer: Self-pay | Admitting: Cardiology

## 2010-06-16 ENCOUNTER — Encounter (HOSPITAL_COMMUNITY)
Admission: RE | Admit: 2010-06-16 | Discharge: 2010-06-17 | Payer: Self-pay | Source: Home / Self Care | Attending: Cardiology | Admitting: Cardiology

## 2010-06-16 ENCOUNTER — Ambulatory Visit: Admission: RE | Admit: 2010-06-16 | Discharge: 2010-06-16 | Payer: Self-pay | Source: Home / Self Care

## 2010-06-17 ENCOUNTER — Ambulatory Visit: Admission: RE | Admit: 2010-06-17 | Discharge: 2010-06-17 | Payer: Self-pay | Source: Home / Self Care

## 2010-06-17 NOTE — Assessment & Plan Note (Signed)
Summary: DIABETES TEACHING  Is Patient Diabetic? Yes Did you bring your meter with you today? Yes   Allergies: 1)  ! Morphine 2)  ! * Codine   Complete Medication List: 1)  Copaxone 20 Mg/ml Kit (Glatiramer acetate) .... Inject every day. 2)  Lyrica 200 Mg Caps (Pregabalin) .... Take 1 tablet by mouth three times a day 3)  Flexeril 10 Mg Tabs (Cyclobenzaprine hcl) .... Take 1 tablet by mouth once three times a day as needed. 4)  Losartan Potassium 50 Mg Tabs (Losartan potassium) .... Take 1 tablet by mouth once a day 5)  Furosemide 40 Mg Tabs (Furosemide) .... Take 1 tablet by mouth twice a day 6)  Nexium 40 Mg Cpdr (Esomeprazole magnesium) .... Take 1 tablet by mouth once a day 7)  Baclofen 10 Mg Tabs (Baclofen) .... Take 1 tablet by mouth twice a day as needed. 8)  Roxicodone 15 Mg Tabs (Oxycodone hcl) .... Take 1 tablet by mouth three times a day 9)  Terazosin Hcl 1 Mg Caps (Terazosin hcl) .... Take 2 tabs by mouth at bedtime 10)  Hydroxyzine Hcl 25 Mg Tabs (Hydroxyzine hcl) .... Take 1 tablet by mouth four times a day 11)  Opana Er 40 Mg Xr12h-tab (Oxymorphone hcl) .... Take 1 tablet every 12 hour for pain as needed. 12)  Ambien 10 Mg Tabs (Zolpidem tartrate) .... Take 1 tablet before bed time as needed. do not take it if you take baclofen. 13)  Lancets Misc (Lancets) .... Use as directed 14)  Bd Insulin Syringe 27.5g X 5/8" 2 Ml Misc (Insulin syringe-needle u-100) .... Use as directed 15)  Prodigy Autocode Blood Glucose Strp (Glucose blood) .... Use to test blood sugar 4-6x daily 16)  Glucophage 1000 Mg Tabs (Metformin hcl) .... Take 1 tablet twice a day. 17)  Metoprolol Tartrate 25 Mg Tabs (Metoprolol tartrate) .... Take 1 tablet by mouth daily. 18)  Ciprofloxacin Hcl 500 Mg Tabs (Ciprofloxacin hcl) .... Take 1 tablet twice a day for 3 days. 19)  Hydrocortisone 0.5 % Crea (Hydrocortisone) .... Apply once or twice a day. 20)  Topamax 100 Mg Tabs (Topiramate) .... Take 1 tablet at  bedtime 21)  Trileptal 150 Mg Tabs (Oxcarbazepine) .... Take 1 tablet twice a day. 22)  Imipramine Hcl 50 Mg Tabs (Imipramine hcl) .... Take 3 tablet by mouth at bedtime. 23)  Novolog Flexpen 100 Unit/ml Soln (Insulin aspart) .... Inject subc depending blood sugar level. blood sugar 150 to 200 inject 2 units. 200 to 300 use 4 units. 300 to 350 use 7 units. 24)  Lantus Solostar 100 Unit/ml Soln (Insulin glargine) .... Inject 26 units subcutaneously every night. 25)  Sure Comfort Pen Needles 31g X 5 Mm Misc (Insulin pen needle) .... Use to inject insulin 1-4 times a day- please dispense a 4mm or patient preference  Other Orders: DSMT (Medicaid) 60 Minutes [phone])  Patient Instructions: 1)  we talked about counting calories for weight loss. 2)  Also decrease Lantus to 26 units a day and 1200 calories per day.  3)  You said you want to keep a count of calories per day. You can give yourself a break form coutning any day of the week.   Diabetes Self Management Training  PCP: Hartley Barefoot MD Referring MD: visit #5 /10 Date diagnosed with diabetes: 02/18/2009 Diabetes Type: Type 2 insulin Other persons present: no Current smoking Status: current  Assessment Daily activities: busy self managing medications- wants to go back to school for  finance- Sources of Support: mother [place] has diabetes being managed by [facility] doctor. patietn reports her child3ern who live with her are also eating healthier. [name] admits today that when in pain- she eats whatever is there nad feels good and is appealing # of people in household: 3  Diabetes Medications:  Comments: blood sugar very well controlled- patient records intake but not amounts and as discussed today- omits things from record like daily banana, a few of this and that. She does nto want her blood sugar to go above 130 and when it does she verbalizes that she is relaizinf it mweans she ate too much. Advised decrease in lantus to 26  units startign tonight and if CBGs are higher than desired goals- che has two options- to work on decreasing protions ( to move toward her goal of weight loss) or call us and discuss going back to 28 units a day.     Monitoring Self monitoring blood glucose 4 times a day Name of Meter  Freestyle Lite Measures urine ketones? No  Recent Episodes of: Requiring Help from another person  Hyperglycemia : No Hypoglycemia: No Severe Hypoglycemia : No   Wears Medical I.D. Yes Carrys Food for Low Blood sugar Yes Can you tell if your blood sugar is low? Yes   Estimated /Usual Carb Intake Breakfast # of Carbs/Grams prunes, banana, cereal, skim milk Lunch # of Carbs/Grams eggs, promise margarine, toast x2, grits,bacon Dinner # of Carbs/Grams yogurt  Nutrition assessment Weight change: no Desired Weight: less- 250 by next year would be ideal Do you read food labels?                                                                          Yes What do you look at?                                                                                                                 discussed today for calorie counting Biggest challenge to eating healthy: Eating too much  Activity Limitations  Inadequate physical activity Diabetes Disease Process  Discussed today Define diabetes in simple terms: Needs review/assistance Medications State name-action-dose-duration-side effects-and time to take medication: Needs review/assistance   Demonstrates/verbalizes site selection and rotation for injections Needs review/assistanceCorrectly draw up and administer insulin-Byetta-Symlin-glucagon: Needs review/assistanceDescribe safe needle/lancet disposal: Demonstrates competency Nutritional Management Identify what foods most often affect blood glucose: Needs review/assistance    State changes planned for home meals/snacks: Needs review/assistance    Monitoring State purpose and frequency of monitoring  BG-ketones-HgbA1C  : Needs review/assistance   State target blood glucose and HgbA1C goals: Demonstrates competency    Complications Explain proper treatment of hyperglycemia: Needs review/assistance   Explain proper treatment of hypoglycemia:  Demonstrates competency    Exercise States importance of exercise: Needs review/assistanceStates effect of exercise on blood glucose: Needs review/assistance Lifestyle changes:Goal setting and Problem solving Develop strategies to reduce risk factors: Needs review/assistance   Diabetes Management Education Done: 08/08/2009    BEHAVIORAL GOALS INITIAL Incorporating appropriate nutritional management: keep track of all calorie intake each day to work on weight loss        walking wihtout a cane now- may increase her activity some. Need to follow-up with her efforts to Quit smoking progress: 100% met goal- a pack  lasts her 4.5 days now. Patient requests prescriptiosn for insulin pens after using her mother last week.    Diabetes Self Management Support: family, CDE and doctor office staff Follow-up:6-8 weeks for reveiw of diabetes self mangment skills/knowledge

## 2010-06-17 NOTE — Progress Notes (Signed)
Summary: Refill/gh  Phone Note Refill Request Message from:  Fax from Pharmacy on December 13, 2009 10:53 AM  Refills Requested: Medication #1:  LANTUS SOLOSTAR 100 UNIT/ML SOLN Inject 20 Units subcutaneously every night.   Last Refilled: 11/05/2009 Pt's last office visit was 09/17/2009.  Last labs were 07/17/2009.   Method Requested: Electronic Initial call taken by: Angelina Ok RN,  December 13, 2009 10:54 AM  Follow-up for Phone Call        Refilled electronically.  Follow-up by: Margarito Liner MD,  December 13, 2009 12:09 PM    Prescriptions: LANTUS SOLOSTAR 100 UNIT/ML SOLN (INSULIN GLARGINE) Inject 20 Units subcutaneously every night.  #3 pens x 2   Entered and Authorized by:   Margarito Liner MD   Signed by:   Margarito Liner MD on 12/13/2009   Method used:   Electronically to        [facility] [address] [phone]* (retail)       [address]       [place], [place]  [address]       Ph: [phone] or [phone]       Fax: [phone]   RxID:   [id number]

## 2010-06-17 NOTE — Assessment & Plan Note (Signed)
Summary: est-ck/fu/meds/cfb   Vital Signs:  Patient profile:   50 year old female Height:      62.5 inches Weight:      281.7 pounds BMI:     50.89 Temp:     97.6 degrees F oral Pulse rate:   95 / minute BP sitting:   143 / 92  (right arm)  Vitals Entered By: Filomena Jungling NT II (August 05, 2009 10:39 AM) CC: NEED REFILLS Is Patient Diabetic? Yes Did you bring your meter with you today? Yes Nutritional Status BMI of > 30 = obese CBG Result 92  Have you ever been in a relationship where you felt threatened, hurt or afraid?No   Does patient need assistance? Functional Status Self care Ambulation Normal   Primary Care Provider:  Hartley Barefoot MD  CC:  NEED REFILLS.  History of Present Illness: 50 year old with Past Medical History: Hypertension Over flow Urinary incontinence Multiple Sclerosis History of Paronichia with Methiciline resistance Staph areus. Diabetes mellitus, type II  She present for regular follow up. SHe was started on IV steroid for 3 days , finished on the  12. She used the regular insulin without problems. Her blood sugar has been well controlled. Range: 93 to 139 fasting.  She is having some urinary incontinence problems. She has decrease energy. She had epidural steroid injection for her back pain. That improved her back pain. She is feeling palpitation.  She has hyperpigmention spot on her left elbow that started 3 month ago.   Preventive Screening-Counseling & Management  Alcohol-Tobacco     Alcohol drinks/day: 0     Smoking Status: current     Smoking Cessation Counseling: yes     Packs/Day: 1/3 ppd     Year Started: AT THE AGE OF 21  Caffeine-Diet-Exercise     Does Patient Exercise: yes     Type of exercise: ROM     Times/week: 5  Current Medications (verified): 1)  Copaxone 20 Mg/ml Kit (Glatiramer Acetate) .... Inject Every Day. 2)  Lyrica 200 Mg Caps (Pregabalin) .... Take 1 Tablet By Mouth Three Times A Day 3)  Flexeril 10 Mg   Tabs (Cyclobenzaprine Hcl) .... Take 1 Tablet By Mouth Once Three Times A Day As Needed. 4)  Losartan Potassium 50 Mg Tabs (Losartan Potassium) .... Take 1 Tablet By Mouth Once A Day 5)  Furosemide 40 Mg Tabs (Furosemide) .... Take 1 Tablet By Mouth Twice A Day 6)  Nexium 40 Mg Cpdr (Esomeprazole Magnesium) .... Take 1 Tablet By Mouth Once A Day 7)  Baclofen 10 Mg Tabs (Baclofen) .... Take 1 Tablet By Mouth Twice A Day As Needed. 8)  Roxicodone 15 Mg Tabs (Oxycodone Hcl) .... Take 1 Tablet By Mouth Three Times A Day 9)  Terazosin Hcl 1 Mg Caps (Terazosin Hcl) .... Take 2 Tabs By Mouth At Bedtime 10)  Hydroxyzine Hcl 25 Mg Tabs (Hydroxyzine Hcl) .... Take 1 Tablet By Mouth Four Times A Day 11)  Opana Er 40 Mg Xr12h-Tab (Oxymorphone Hcl) .... Take 1 Tablet Every 12 Hour For Pain As Needed. 12)  Ambien 10 Mg Tabs (Zolpidem Tartrate) .... Take 1 Tablet Before Bed Time As Needed. Do Not Take It If You Take Baclofen. 13)  Lantus 100 Unit/ml Soln (Insulin Glargine) .... Inject 28 Units Subcutaneously Every Night. 14)  Lancets  Misc (Lancets) .... Use As Directed 15)  Bd Insulin Syringe 27.5g X 5/8" 2 Ml Misc (Insulin Syringe-Needle U-100) .... Use As Directed 16)  Prodigy Autocode Blood Glucose  Strp (Glucose Blood) .... Use To Test Blood Sugar 4-6x Daily 17)  Glucophage 1000 Mg Tabs (Metformin Hcl) .... Take 1 Tablet Twice A Day. 18)  Metoprolol Tartrate 25 Mg Tabs (Metoprolol Tartrate) .... Take 1 Tablet By Mouth Daily. 19)  Ciprofloxacin Hcl 500 Mg Tabs (Ciprofloxacin Hcl) .... Take 1 Tablet Twice A Day For 3 Days. 20)  Novolog 100 Unit/ml Soln (Insulin Aspart) .... Inject Subc Depending Blood Sugar Level. Blood Sugar 150 To 200 Inject 2 Units. 200 To 300 Use 4 Units. 300 To 350 Use 7 Units. 21)  Hydrocortisone 0.5 % Crea (Hydrocortisone) .... Apply Once or Twice A Day. 22)  Topamax 100 Mg Tabs (Topiramate) .... Take 1 Tablet At Bedtime 23)  Trileptal 150 Mg Tabs (Oxcarbazepine) .... Take 1 Tablet  Twice A Day. 24)  Imipramine Hcl 50 Mg Tabs (Imipramine Hcl) .... Take 3 Tablet By Mouth At Bedtime.  Allergies: 1)  ! Morphine 2)  ! * Codine  Review of Systems  The patient denies fever, chest pain, syncope, dyspnea on exertion, peripheral edema, prolonged cough, headaches, hemoptysis, abdominal pain, melena, and hematochezia.    Physical Exam  General:  alert, well-developed, and well-nourished.   Head:  normocephalic and atraumatic.   Lungs:  normal respiratory effort, no intercostal retractions, no accessory muscle use, and normal breath sounds.   Heart:  normal rate, regular rhythm, and no murmur.   Abdomen:  soft, non-tender, normal bowel sounds, and no distention.   Extremities:  no edema. Neurologic:  alert & oriented X3 and cranial nerves II-XII intact.     Impression & Recommendations:  Problem # 1:  DIABETES MELLITUS (ICD-250.00) Her HBA1c is at goal at 5.9. Very well contrelled. No hypoglycemia. I will continue with same regimen. Her updated medication list for this problem includes:    Losartan Potassium 50 Mg Tabs (Losartan potassium) .Marland Kitchen... Take 1 tablet by mouth once a day    Lantus 100 Unit/ml Soln (Insulin glargine) ..... Inject 28 units subcutaneously every night.    Glucophage 1000 Mg Tabs (Metformin hcl) .Marland Kitchen... Take 1 tablet twice a day.    Novolog 100 Unit/ml Soln (Insulin aspart) ..... Inject subc depending blood sugar level. blood sugar 150 to 200 inject 2 units. 200 to 300 use 4 units. 300 to 350 use 7 units.  Orders: T- Capillary Blood Glucose ([id number]) T-Hgb A1C (in-house) ([id number]) Podiatry Referral (Podiatry)  Problem # 2:  TACHYCARDIA (ICD-785.0) She is feeling palpitation. I will continue with metoprolol. I will refer for holter monitor, depending on result will increase metoprolol dose.  Orders: Cardiology Referral (Cardiology)  Problem # 3:  HYPERTENSION (ICD-401.9) Her blood pressure has improved since prior visit. I will continue with current  regimen. Her updated medication list for this problem includes:    Losartan Potassium 50 Mg Tabs (Losartan potassium) .Marland Kitchen... Take 1 tablet by mouth once a day    Furosemide 40 Mg Tabs (Furosemide) .Marland Kitchen... Take 1 tablet by mouth twice a day    Terazosin Hcl 1 Mg Caps (Terazosin hcl) .Marland Kitchen... Take 2 tabs by mouth at bedtime    Metoprolol Tartrate 25 Mg Tabs (Metoprolol tartrate) .Marland Kitchen... Take 1 tablet by mouth daily.  BP today: 143/92 Prior BP: 154/85 (05/03/2009)  Labs Reviewed: K+: 4.3 (04/03/2009) Creat: : 0.70 (04/03/2009)   Chol: 227 (03/26/2008)   HDL: 68 (03/26/2008)   LDL: 116 (03/26/2008)   TG: 216 (03/26/2008)  Orders: T-Basic Metabolic Panel ([phone])  Problem # 4:  SKIN RASH (ICD-782.1) She has hyperpigmentation lesion on her left shoulder, accompanied by itching. I will prescribe steroid cream, if this doesnt help will refer to dermatologist. Dr, Rogelia Boga saw the rash and agree with plan.  Her updated medication list for this problem includes:    Hydrocortisone 0.5 % Crea (Hydrocortisone) .Marland Kitchen... Apply once or twice a day.  Complete Medication List: 1)  Copaxone 20 Mg/ml Kit (Glatiramer acetate) .... Inject every day. 2)  Lyrica 200 Mg Caps (Pregabalin) .... Take 1 tablet by mouth three times a day 3)  Flexeril 10 Mg Tabs (Cyclobenzaprine hcl) .... Take 1 tablet by mouth once three times a day as needed. 4)  Losartan Potassium 50 Mg Tabs (Losartan potassium) .... Take 1 tablet by mouth once a day 5)  Furosemide 40 Mg Tabs (Furosemide) .... Take 1 tablet by mouth twice a day 6)  Nexium 40 Mg Cpdr (Esomeprazole magnesium) .... Take 1 tablet by mouth once a day 7)  Baclofen 10 Mg Tabs (Baclofen) .... Take 1 tablet by mouth twice a day as needed. 8)  Roxicodone 15 Mg Tabs (Oxycodone hcl) .... Take 1 tablet by mouth three times a day 9)  Terazosin Hcl 1 Mg Caps (Terazosin hcl) .... Take 2 tabs by mouth at bedtime 10)  Hydroxyzine Hcl 25 Mg Tabs (Hydroxyzine hcl) .... Take 1 tablet by  mouth four times a day 11)  Opana Er 40 Mg Xr12h-tab (Oxymorphone hcl) .... Take 1 tablet every 12 hour for pain as needed. 12)  Ambien 10 Mg Tabs (Zolpidem tartrate) .... Take 1 tablet before bed time as needed. do not take it if you take baclofen. 13)  Lantus 100 Unit/ml Soln (Insulin glargine) .... Inject 28 units subcutaneously every night. 14)  Lancets Misc (Lancets) .... Use as directed 15)  Bd Insulin Syringe 27.5g X 5/8" 2 Ml Misc (Insulin syringe-needle u-100) .... Use as directed 16)  Prodigy Autocode Blood Glucose Strp (Glucose blood) .... Use to test blood sugar 4-6x daily 17)  Glucophage 1000 Mg Tabs (Metformin hcl) .... Take 1 tablet twice a day. 18)  Metoprolol Tartrate 25 Mg Tabs (Metoprolol tartrate) .... Take 1 tablet by mouth daily. 19)  Ciprofloxacin Hcl 500 Mg Tabs (Ciprofloxacin hcl) .... Take 1 tablet twice a day for 3 days. 20)  Novolog 100 Unit/ml Soln (Insulin aspart) .... Inject subc depending blood sugar level. blood sugar 150 to 200 inject 2 units. 200 to 300 use 4 units. 300 to 350 use 7 units. 21)  Hydrocortisone 0.5 % Crea (Hydrocortisone) .... Apply once or twice a day. 22)  Topamax 100 Mg Tabs (Topiramate) .... Take 1 tablet at bedtime 23)  Trileptal 150 Mg Tabs (Oxcarbazepine) .... Take 1 tablet twice a day. 24)  Imipramine Hcl 50 Mg Tabs (Imipramine hcl) .... Take 3 tablet by mouth at bedtime.  Patient Instructions: 1)  Please schedule a follow-up appointment in 3 months. Prescriptions: HYDROCORTISONE 0.5 % CREA (HYDROCORTISONE) Apply once or twice a day.  #1 x 0   Entered and Authorized by:   Hartley Barefoot MD   Signed by:   Hartley Barefoot MD on 08/05/2009   Method used:   Print then Give to Patient   RxID:   [id number] METOPROLOL TARTRATE 25 MG TABS (METOPROLOL TARTRATE) Take 1 tablet by mouth daily.  #30 x 4   Entered and Authorized by:   Hartley Barefoot MD   Signed by:   Hartley Barefoot MD on 08/05/2009   Method used:   Print then  Give to  Patient   RxID:   [id number] GLUCOPHAGE 1000 MG TABS (METFORMIN HCL) Take 1 tablet twice a day.  #60 x 4   Entered and Authorized by:   Hartley Barefoot MD   Signed by:   Hartley Barefoot MD on 08/05/2009   Method used:   Print then Give to Patient   RxID:   [id number] AMBIEN 10 MG TABS (ZOLPIDEM TARTRATE) Take 1 tablet before bed time as needed. Do not take it if you take baclofen.  #30 x 4   Entered and Authorized by:   Hartley Barefoot MD   Signed by:   Hartley Barefoot MD on 08/05/2009   Method used:   Print then Give to Patient   RxID:   [id number] FUROSEMIDE 40 MG TABS (FUROSEMIDE) Take 1 tablet by mouth twice a day  #30 x 4   Entered and Authorized by:   Hartley Barefoot MD   Signed by:   Hartley Barefoot MD on 08/05/2009   Method used:   Print then Give to Patient   RxID:   [id number] LOSARTAN POTASSIUM 50 MG TABS (LOSARTAN POTASSIUM) Take 1 tablet by mouth once a day  #30 x 4   Entered and Authorized by:   Hartley Barefoot MD   Signed by:   Hartley Barefoot MD on 08/05/2009   Method used:   Print then Give to Patient   RxID:   [id number] HYDROXYZINE HCL 25 MG TABS (HYDROXYZINE HCL) Take 1 tablet by mouth four times a day  #50 x 3   Entered and Authorized by:   Hartley Barefoot MD   Signed by:   Hartley Barefoot MD on 08/05/2009   Method used:   Print then Give to Patient   RxID:   [id number] TERAZOSIN HCL 1 MG CAPS (TERAZOSIN HCL) Take 2 tabs by mouth at bedtime  #60 x 4   Entered and Authorized by:   Hartley Barefoot MD   Signed by:   Hartley Barefoot MD on 08/05/2009   Method used:   Print then Give to Patient   RxID:   [id number] NEXIUM 40 MG CPDR (ESOMEPRAZOLE MAGNESIUM) Take 1 tablet by mouth once a day  #90 x 6   Entered and Authorized by:   Hartley Barefoot MD   Signed by:   Hartley Barefoot MD on 08/05/2009   Method used:   Print then Give to Patient   RxID:   [id number] FLEXERIL 10 MG  TABS (CYCLOBENZAPRINE HCL) Take 1 tablet by  mouth once three times a day as needed.  #30 x 3   Entered and Authorized by:   Hartley Barefoot MD   Signed by:   Hartley Barefoot MD on 08/05/2009   Method used:   Print then Give to Patient   RxID:   [id number]   Prevention & Chronic Care Immunizations   Influenza vaccine: Fluvax 3+  (02/24/2008)   Influenza vaccine deferral: Not indicated  (02/12/2009)    Tetanus booster: Not documented    Pneumococcal vaccine: Pneumovax  (02/24/2008)  Other Screening   Pap smear: Not documented   Pap smear action/deferral: GYN Referral  (11/28/2008)    Mammogram: BI-RADS CATEGORY 2:  Benign finding(s).^MM DIGITAL DIAG LTD L  (03/12/2009)   Mammogram action/deferral: Ordered  (01/31/2009)   Mammogram due: 02/2009   Smoking status: current  (08/05/2009)   Smoking cessation counseling: yes  (08/05/2009)  Diabetes Mellitus   HgbA1C: 5.8  (08/05/2009)    Eye exam: Not documented    Foot  exam: Not documented   Foot exam action/deferral: Do today   High risk foot: Yes  (03/04/2009)   Foot care education: Done  (03/04/2009)   Foot exam due: 06/04/2009    Urine microalbumin/creatinine ratio: 19.6  (05/03/2009)   Urine microalbumin action/deferral: Ordered  Lipids   Total Cholesterol: 227  (03/26/2008)   LDL: 116  (03/26/2008)   LDL Direct: Not documented   HDL: 68  (03/26/2008)   Triglycerides: 216  (03/26/2008)   Lipid panel due: 05/31/2009  Hypertension   Last Blood Pressure: 143 / 92  (08/05/2009)   Serum creatinine: 0.70  (04/03/2009)   Serum potassium 4.3  (04/03/2009)  Self-Management Support :   Personal Goals (by the next clinic visit) :     Personal A1C goal: 7  (03/04/2009)   Patient will work on the following items until the next clinic visit to reach self-care goals:     Medications and monitoring: take my medicines every day  (02/12/2009)     Eating: eat more vegetables, eat foods that are low in salt, eat baked foods instead of fried foods  (08/05/2009)      Activity: take a 30 minute walk every day  (08/05/2009)     Other: Spoke with Jamison Neighbor this morning. See Office Visit on 03-04-09.  (03/04/2009)    Diabetes self-management support: Not documented   Last diabetes self-management training by diabetes educator: 05/14/2009    Hypertension self-management support: Written self-care plan  (02/12/2009)   Laboratory Results   Blood Tests   Date/Time Received: August 05, 2009 10:53 AM  Date/Time Reported: Burke Keels  August 05, 2009 10:53 AM   HGBA1C: 5.8%   (Normal Range: Non-Diabetic - 3-6%   Control Diabetic - 6-8%) CBG Random:: 92mg /dL      Process Orders Check Orders Results:     [facility]: ABN not required for this insurance Tests Sent for requisitioning (August 05, 2009 1:52 PM):     08/05/2009: [facility] -- T-Basic Metabolic Panel [id number] (signed)

## 2010-06-17 NOTE — Letter (Signed)
Summary: MeterDownLoad  MeterDownLoad   Imported By: Florinda Marker 08/07/2009 15:08:33  _____________________________________________________________________  External Attachment:    Type:   Image     Comment:   External Document

## 2010-06-17 NOTE — Progress Notes (Signed)
Summary: has left our practice/dmr  Phone Note Call from Patient Call back at Home Phone [phone]   Caller: Patient Summary of Call: wants to know if she can continue to see CDE in our office (me) when she has changed her primary care doctor to [facility] [address].  Discussed with director who informed me that we are currently unable to take patients outside this practice right now. Initial call taken by: Jamison Neighbor RD,CDE,  March 17, 2010 12:32 PM  Follow-up for Phone Call        returned patient call and left mesaage. Follow-up by: Jamison Neighbor RD,CDE,  March 17, 2010 2:42 PM  Additional Follow-up for Phone Call Additional follow up Details #1::        spoke with patient today and explained that she will no longer be able to see CDE here (me). She denied need for referral elsewhere. Additional Follow-up by: Jamison Neighbor RD,CDE,  March 18, 2010 11:34 AM

## 2010-06-17 NOTE — Assessment & Plan Note (Signed)
Summary: [name].   Vital Signs:  Patient profile:   50 year old female Weight:      282.3 pounds BMI:     50.99 Is Patient Diabetic? Yes Did you bring your meter with you today? No   Allergies: 1)  ! Morphine 2)  ! * Codine   Complete Medication List: 1)  Copaxone 20 Mg/ml Kit (Glatiramer acetate) .... Inject every day. 2)  Lyrica 200 Mg Caps (Pregabalin) .... Take 1 tablet by mouth three times a day 3)  Flexeril 10 Mg Tabs (Cyclobenzaprine hcl) .... Take 1 tablet by mouth once three times a day as needed. 4)  Losartan Potassium 50 Mg Tabs (Losartan potassium) .... Take 1 tablet by mouth once a day 5)  Furosemide 40 Mg Tabs (Furosemide) .... Take 1 tablet by mouth twice a day 6)  Nexium 40 Mg Cpdr (Esomeprazole magnesium) .... Take 1 tablet by mouth once a day 7)  Baclofen 10 Mg Tabs (Baclofen) .... Take 1 tablet by mouth twice a day as needed. 8)  Roxicodone 15 Mg Tabs (Oxycodone hcl) .... Take 1 tablet by mouth three times a day 9)  Terazosin Hcl 1 Mg Caps (Terazosin hcl) .... Take 2 tabs by mouth at bedtime 10)  Hydroxyzine Hcl 25 Mg Tabs (Hydroxyzine hcl) .... Take 1 tablet by mouth four times a day 11)  Opana Er 40 Mg Xr12h-tab (Oxymorphone hcl) .... Take 1 tablet every 12 hour for pain as needed. 12)  Ambien 10 Mg Tabs (Zolpidem tartrate) .... Take 1 tablet before bed time as needed. do not take it if you take baclofen. 13)  Lancets Misc (Lancets) .... Use as directed 14)  Bd Insulin Syringe 27.5g X 5/8" 2 Ml Misc (Insulin syringe-needle u-100) .... Use as directed 15)  Prodigy Autocode Blood Glucose Strp (Glucose blood) .... Use to test blood sugar 4-6x daily 16)  Glucophage 1000 Mg Tabs (Metformin hcl) .... Take 1 tablet twice a day. 17)  Metoprolol Tartrate 25 Mg Tabs (Metoprolol tartrate) .... Take 1 tablet by mouth daily. 18)  Ciprofloxacin Hcl 500 Mg Tabs (Ciprofloxacin hcl) .... Take 1 tablet twice a day for 3 days. 19)  Hydrocortisone 0.5 % Crea (Hydrocortisone)  .... Apply once or twice a day. 20)  Topamax 100 Mg Tabs (Topiramate) .... Take 1 tablet at bedtime 21)  Trileptal 150 Mg Tabs (Oxcarbazepine) .... Take 1 tablet twice a day. 22)  Imipramine Hcl 50 Mg Tabs (Imipramine hcl) .... Take 3 tablet by mouth at bedtime. 23)  Novolog Flexpen 100 Unit/ml Soln (Insulin aspart) .... Inject subc depending blood sugar level. blood sugar 150 to 200 inject 2 units. 200 to 300 use 4 units. 300 to 350 use 7 units. 24)  Lantus Solostar 100 Unit/ml Soln (Insulin glargine) .... Inject 23 units subcutaneously every night. 25)  Sure Comfort Pen Needles 31g X 5 Mm Misc (Insulin pen needle) .... Use to inject insulin 1-4 times a day- please dispense a 4mm or patient preference  Other Orders: DSMT (Medicaid) 60 Minutes [phone])    Diabetes Self Management Training  PCP: Hartley Barefoot MD Referring MD: visit #6 /10 Date diagnosed with diabetes: 02/18/2009 Diabetes Type: Type 2 insulin Other persons present: mother Current smoking Status: current  Vital Signs Todays Weight: 282.3lb  in BMI 50.99in-lbs   Assessment Daily activities: busy self managing medications- wants to go back to school for finance- Sources of Support: mother has long standing diabetes, is on both basla and bolus insulin, having trouble wiht  hypoglycemia Special needs or Barriers: eats during sleeping hours when half asleep and half awake.  Coping with Diabetes Feelings about Diabetes: Acceptance  Diabetes Medications:  Comments: Patient reports CBgs still very well controlled onless lantus insulin. Note now weight change since last visit. patient reports more painand problems with sleep as barriers. She denies speep apnea or symptoms.   Long Acting  Insulin Type:Lantus  Bedtime Dose: 23    Monitoring Self monitoring blood glucose 4 times a day Name of Meter  Freestyle Lite Measures urine ketones? No  Time of Testing  Before meals   Wears Medical I.D. Yes [employer] for  Low Blood sugar Yes Can you tell if your blood sugar is low? Yes   Estimated /Usual Carb Intake Breakfast # of Carbs/Grams 30- 45gm Midmorning # of Carbs/Grams 1=15gm Lunch # of Carbs/Grams 30- 45gm Midafternoon # of Carbs/Grams 1=15gm Dinner # of Carbs/Grams 30-45gm Bedtime # of Carbs/Grams 1=15gm  Nutrition assessment Do you read food labels?                                                                          Yes What do you look at?                                                                                                                 reveiwed calorie counting today again with label reading for calories rather than carbohydrates per patienbt request  Activity Limitations  Inadequate physical activity Diabetes Disease Process  Discussed today State diabetes is treated by meal plan-exercise-medication-monitoring-education: Demonstrates competency Medications State name-action-dose-duration-side effects-and time to take medication: Demonstrates competency   State appropriate timing of food related to medication: Demonstrates competency   Demonstrates/verbalizes site selection and rotation for injections Demonstrates competencyCorrectly draw up and administer insulin-Byetta-Symlin-glucagon: Demonstrates competency Nutritional Management Identify what foods most often affect blood glucose: Demonstrates competency    Verbalize importance of controlling food portions: Demonstrates competency   State importance of spacing and not omitting meals and snacks: Demonstrates competency   State changes planned for home meals/snacks: Needs review/assistance    Monitoring State purpose and frequency of monitoring BG-ketones-HgbA1C  : Demonstrates competency   State target blood glucose and HgbA1C goals: Demonstrates competency    Complications State the causes- signs and symptoms and prevention of hypoglycemia: Demonstrates competency   Explain proper treatment of  hypoglycemia: Demonstrates competency    Exercise Diabetes Management Education Done: 08/19/2009    BEHAVIORAL GOAL FOLLOW UP Incorporating appropriate nutritional management: Sometimes Specific goal set today: bought a scale and measuring cups, came today requesting a reveiw of how to measure and record calories. Reviewed this with patient and mother today.       walking slow today- reports "in pain".  Need to follow-up with her efforts to Quit smoking progress: 100% met goal- a pack  lasts her 4.5 days .    Diabetes Self Management Support: family, CDE and doctor office staff Follow-up:6-8 weeks for reveiw of diabetes self mangment skills/knowledge

## 2010-06-17 NOTE — Progress Notes (Signed)
Summary:  change to insulin pens/dmr  Phone Note Refill Request   Refills Requested: Medication #1:  NOVOLOG FLEXPEN 100 UNIT/ML SOLN inject subc depending blood sugar level. Blood sugar 150 to 200 inject 2 units. 200 to 300 use 4 units. 300 to 350 use 7 units.  Medication #2:  LANTUS SOLOSTAR 100 UNIT/ML SOLN Inject 26 Units subcutaneously every night.  Medication #3:  SURE COMFORT PEN NEEDLES 31G X 5 MM MISC use to inject insulin 1-4 times a day- please dispense a 4mm or patient preference. patient request to change to insulin pens- also decreased Lantus insulin to 26 units with continued self monitoring to assure continued CBgs in target range.   Initial call taken by: Jamison Neighbor RD,CDE,  August 08, 2009 12:38 PM    Prescriptions: NOVOLOG FLEXPEN 100 UNIT/ML SOLN (INSULIN ASPART) inject subc depending blood sugar level. Blood sugar 150 to 200 inject 2 units. 200 to 300 use 4 units. 300 to 350 use 7 units.  #1 x 1   Entered by:   Jamison Neighbor RD,CDE   Authorized by:   Hartley Barefoot MD   Signed by:   Hartley Barefoot MD on 08/08/2009   Method used:   [facility]        [facility] [address] [phone]* (retail)       [address]       [place], [place]  [address]       Ph: [phone] or [phone]       Fax: [phone]   RxID:   [id number] LANTUS SOLOSTAR 100 UNIT/ML SOLN (INSULIN GLARGINE) Inject 26 Units subcutaneously every night.  #1 x 3   Entered by:   Jamison Neighbor RD,CDE   Authorized by:   Hartley Barefoot MD   Signed by:   Hartley Barefoot MD on 08/08/2009   Method used:   [facility]        [facility] [address] [phone]* (retail)       [address]       [place], [place]  [address]       Ph: [phone] or [phone]       Fax: [phone]   RxID:   [id number] SURE COMFORT PEN NEEDLES 31G X 5 MM MISC (INSULIN PEN NEEDLE) use to inject insulin 1-4 times a day- please dispense a 4mm or patient preference   #100 x 11   Entered by:   Jamison Neighbor RD,CDE   Authorized by:   Hartley Barefoot MD   Signed by:   Hartley Barefoot MD on 08/08/2009   Method used:   [facility]        [facility] [address] [phone]* (retail)       [address]       [place], [place]  [address]       Ph: [phone] or [phone]       Fax: [phone]   RxID:   [id number]

## 2010-06-17 NOTE — Progress Notes (Signed)
Summary: med refi/gp  Phone Note Refill Request Message from:  Fax from Pharmacy on February 10, 2010 9:54 AM  Refills Requested: Medication #1:  GLUCOPHAGE 1000 MG TABS Take 1 tablet twice a day.   Last Refilled: 01/14/2010 Last appt. 09/17/09; next appt. 03/25/10.   Method Requested: Electronic Initial call taken by: Chinita Pester RN,  February 10, 2010 9:53 AM    Prescriptions: GLUCOPHAGE 1000 MG TABS (METFORMIN HCL) Take 1 tablet twice a day.  #60 x 11   Entered and Authorized by:   Zoila Shutter MD   Signed by:   Zoila Shutter MD on 02/10/2010   Method used:   [facility]        [facility]  [address] [phone]* (retail)       [address]       [place], [place]  [id number]       Ph: [phone] or [phone]       Fax: [phone]   RxID:   [id number]

## 2010-06-17 NOTE — Progress Notes (Signed)
Summary: diabetes support/dmr  Phone Note Call from Patient Call back at Home Phone [phone]   Caller: Patient Summary of Call: Patient called with CBgs and decreased alntus doses: stopped increasing at 20 units to maintain excellent control: 20 units per day of lantus CBGs = 7/22- 117 7/23- 90, 82, 113 7/24- 105,89, 95   7/25 - 108, 116, - will remain on  20 units until more CBgs < 100.    New/Updated Medications: LANTUS SOLOSTAR 100 UNIT/ML SOLN (INSULIN GLARGINE) Inject 20 Units subcutaneously every night.

## 2010-06-17 NOTE — Progress Notes (Signed)
Summary: med refillgp  Phone Note Refill Request Message from:  Fax from Pharmacy on August 01, 2009 9:15 AM  Request refill on Prodigy Insulin Syringe 1ml  28G x 1/2"  Use as directed Qty 100 each     Last refill 02/22/09   Method Requested: Electronic Initial call taken by: Chinita Pester RN,  August 01, 2009 9:15 AM    Prescriptions: BD INSULIN SYRINGE 27.5G X 5/8" 2 ML MISC (INSULIN SYRINGE-NEEDLE U-100) use as directed  #30 x 5   Entered and Authorized by:   Hartley Barefoot MD   Signed by:   Hartley Barefoot MD on 08/01/2009   Method used:   [facility]        [facility] [address] [phone]* (retail)       [address]       [place], [place]  [address]       Ph: [phone] or [phone]       Fax: [phone]   RxID:   [id number]

## 2010-06-17 NOTE — Miscellaneous (Signed)
Summary: ED visit F/U  ED discharge Note  Patient is a 50 year old female with PMH of recurrent MS, DM, HTN and morbid obesituy who came to Ed for HA, blurry vision and left sided weakness. She has similar symptoms before and she has been taking all her meds as instructed. During the past several days she found her symptoms became worse, but no fever, N/V/D. Her left sided weakness has been stable. She also has upain over her neck, low back, hip, knee and leg pain. She states is chronic at baseline but gets worse with MS exacerbations. She has no other c/o. She has been f/u by neurologist Dr. Thad Ranger and last seen by PA three weeks ago.   PE: T 97.9 BP 136/76 HR 86 RR 17  O2Sat 100% on RA  General:  alert, obese, well-developed, and NAD.   Head:  normocephalic and atraumatic.   Lungs:  normal respiratory effort, no intercostal retractions, and no accessory muscle use.   Heart:  normal rate and regular rhythm.   Abdomen:  soft, non-tender, normal bowel sounds, and no distention.   Extremities:  trace edema, no redness.  Neurological: alert & oriented X3 and cranial nerves II-XII intact. Non-focal except left sided slight weakness (MS 4/5)  Labs:  CBC+Diff WBC                                      6.8               4.0-10.5         K/uL  RBC                                      4.78              3.87-5.11        MIL/uL  Hemoglobin (HGB)                         14.2              12.0-15.0        g/dL  Hematocrit (HCT)                         42.2              36.0-46.0        %  MCV                                      88.2              78.0-100.0       fL  MCHC                                     33.7              30.0-36.0        g/dL  RDW  13.6              11.5-15.5        %  Platelet Count (PLT)                     318               150-400          K/uL  Neutrophils, %                           51                43-77            %  Lymphocytes, %                            36                12-46            %  Monocytes, %                             11                3-12             %  Eosinophils, %                           2                 0-5              %  Basophils, %                             1                 0-1              %  Neutrophils, Absolute                    3.5               1.7-7.7          K/uL  Lymphocytes, Absolute                    2.4               0.7-4.0          K/uL  Monocytes, Absolute                      0.7               0.1-1.0          K/uL  Eosinophils, Absolute                    0.1               0.0-0.7          K/uL  Basophils, Absolute                      0.1  0.0-0.1          K/uL  BMET Sodium (NA)                              139               135-145          mEq/L  Potassium (K)                            3.6               3.5-5.1          mEq/L  Chloride                                 108               96-112           mEq/L  CO2                                      26                19-32            mEq/L  Glucose                                  72                70-99            mg/dL  BUN                                      8                 6-23             mg/dL  Creatinine                               0.76              0.4-1.2          mg/dL  GFR, Est Non African American            >60               >60              mL/min  GFR, Est African American                >60               >60              mL/min    Oversized comment, see footnote  1  Calcium                                  9.1  8.4-10.5         mg/dL  CT HEAD WITHOUT CONTRAST    Technique:  Contiguous axial images were obtained from the base of   the skull through the vertex without contrast.    Comparison: 10/26/2007.    Findings: Visualized orbits and scalp soft tissues are within   normal limits.  Visualized paranasal sinuses and mastoids are   clear.  No acute osseous  abnormality identified.    Cerebral volume is within normal limits for age.  No midline shift,   ventriculomegaly, mass effect, evidence of mass lesion,   intracranial hemorrhage or evidence of cortically based acute   infarction.  Gray-white matter differentiation is within normal   limits throughout the brain.  No suspicious intracranial vascular   hyperdensity.    IMPRESSION:   Normal noncontrast CT appearance of the brain.  A/P:  1. Blurred vision with HA, left -sided weakness and wosening pain all over her body: she has Hx of recurrent MS, and has been taking all her meds include copaxone. She has no new symptoms, but her worsening symptoms may suggest MS flare. I have requested neurology consult and talked with Neurologist Dr. Carolynn Sayers on call tonight. He said there is no need for acute steroids or MRI imaging as she has no focal neurological findings and MRI did not show significant flare last october with similar symptoms. Instead she needs outpatient follow-up with her neurologist Dr. Thad Ranger and decide for any change of his long-term treatment plan.I also talked with our attending Dr. Aundria Rud who is on call tonight and we think she is stable for discharge at this point and also neurologist recommended to outpatient follow up. If any worsening she can go to ED or call neurology office. Patient agree to go home and call neurology office tomorrow at [phone].     Patient Instructions: 1)  Please call neurology office tomorrow at [phone] to make the appointment with Dr. Thad Ranger or other neurologist. 2)  Tobacco is very bad for your health and your loved ones! You Should stop smoking!. 3)  Stop Smoking Tips: Choose a Quit date. Cut down before the Quit date. decide what you will do as a substitute when you feel the urge to smoke(gum,toothpick,exercise).

## 2010-06-17 NOTE — Progress Notes (Signed)
Summary: phone/gg  Phone Note Call from Patient   Caller: Patient Summary of Call: On thursday pt was put on solumedrol iv by dr Thad Ranger- [facility]- at home for 3 days for MS symptoms. Pt called stating today she is not feeling better.  Still having mild stiff neck, eye pain  and headache.  Pt has been on steroids before but usually relieve all pain.  Her CBG's have been stable.   should she be concern about the mild pain? Pt # [id number] Initial call taken by: Merrie Roof RN,  July 29, 2009 12:30 PM  Follow-up for Phone Call        Called her.  Her discomfort is clearly a continuation of the flare for which she is on solu-medrol.  I advised her to return to her neurologist which is already underway. Follow-up by: Ulyess Mort MD,  July 29, 2009 2:37 PM

## 2010-06-17 NOTE — Assessment & Plan Note (Signed)
Summary: diabetes teaching/cfb   Vital Signs:  Patient profile:   50 year old female Weight:      294.1 pounds Is Patient Diabetic? Yes Did you bring your meter with you today? Yes see meter download- CBGs are excellent-  ~98% < 160 testing 3x a day sometimes after meals. range is 73-170 for the past 2 weeks. patient desires to decrease her lantus- agree that she should be abel to do this ann continue excellent control of her diabetes. We agreed on a decrease of 1 unit each and to stop decreasing if am CBG > 110 mg/dl. Could consider Januvia or Janumet (weight neutral) if CBgs increase with decreased insulin.   MEDICAL NUTRITION THERAPY Start time: 8:30 am  End time: 9:15 AM)  Assessment:Has gained weight over past 1-2 months. Has been eating "whatever is in front of her" due to increased pain. However, insists that she wants to work through the pain without more medication.Newly diagnosed with sleep apnea- having difficult time using CPAP. Wants to decrease her insulin.  Medications:lantus- 23 units a night to begin 22 units a night for 1 week, then decrease 1 unit each week as long as she maintains fasting CBG< 110mg /dl. Blood sugars: see meter download 24 hour recall: we did not speciifcally reveiw thisbut from what patient reports, this has increased. Exercise:Suspect that energy output has decreased with increased pain and heat Labs:reveiwed with patient today- due for A1C and lipid panel, +/- micral Estimated needs: 1200 calories & 60-79 g protein/day  Diagnosis:  NB 1.6 -limited adherence to nutrtion recommendations as related to patient desire for weight loss and increased intake (likley due to pain) as evidenced by her weight gain.   Intervention:  1-Educted patient on use of Meal replacements for weight loss. 2-Meal plan for 1200 calories a day using 2 MRPs a day provided 3- Educated on need for CPAP use to help her decrease inflammationa dn have more energy during the day.    4- Discussed gradual reduction in insulin as she decreases weight and food intake  Monitoring:documentation of 1 day food record demonstrating adherence to meal plan. Decrease in lantus dose Evaluation:weight change and A1C maintenance  Follow-up:5 weeks   Allergies: 1)  ! Morphine 2)  ! * Codine   Complete Medication List: 1)  Copaxone 20 Mg/ml Kit (Glatiramer acetate) .... Inject every day. 2)  Lyrica 200 Mg Caps (Pregabalin) .... Take 1 tablet by mouth three times a day 3)  Flexeril 10 Mg Tabs (Cyclobenzaprine hcl) .... Take 1 tablet by mouth once three times a day as needed. 4)  Losartan Potassium 50 Mg Tabs (Losartan potassium) .... Take 1 tablet by mouth once a day 5)  Furosemide 40 Mg Tabs (Furosemide) .... Take 1 tablet by mouth twice a day 6)  Nexium 40 Mg Cpdr (Esomeprazole magnesium) .... Take 1 tablet by mouth once a day 7)  Baclofen 10 Mg Tabs (Baclofen) .... Take 1 tablet by mouth twice a day as needed. 8)  Roxicodone 15 Mg Tabs (Oxycodone hcl) .... Take 1 tablet by mouth three times a day 9)  Terazosin Hcl 1 Mg Caps (Terazosin hcl) .... Take 2 tabs by mouth at bedtime 10)  Hydroxyzine Hcl 25 Mg Tabs (Hydroxyzine hcl) .... Take 1 tablet by mouth three times daily.  if needed, a fourth pill can be taken.  this medicine is for itching. 11)  Opana Er 40 Mg Xr12h-tab (Oxymorphone hcl) .... Take 1 tablet every 12 hour for pain as needed.  12)  Ambien 10 Mg Tabs (Zolpidem tartrate) .... Take 1 tablet before bed time as needed. do not take it if you take baclofen. 13)  Lancets Misc (Lancets) .... Use to check blood sugar 3 times a day 14)  Bd Insulin Syringe 27.5g X 5/8" 2 Ml Misc (Insulin syringe-needle u-100) .... Use as directed 15)  Freestyle Lite Test Strp (Glucose blood) .... Use strip as directed. 16)  Glucophage 1000 Mg Tabs (Metformin hcl) .... Take 1 tablet twice a day. 17)  Metoprolol Tartrate 25 Mg Tabs (Metoprolol tartrate) .... Take 1 tablet by mouth daily. 18)   Ciprofloxacin Hcl 500 Mg Tabs (Ciprofloxacin hcl) .... Take 1 tablet twice a day for 3 days. 19)  Hydrocortisone 0.5 % Crea (Hydrocortisone) .... Apply once or twice a day. 20)  Topamax 100 Mg Tabs (Topiramate) .... Take 1 tablet at bedtime 21)  Trileptal 150 Mg Tabs (Oxcarbazepine) .... Take 1 tablet twice a day. 22)  Imipramine Hcl 50 Mg Tabs (Imipramine hcl) .... Take 3 tablet by mouth at bedtime. 23)  Novolog Flexpen 100 Unit/ml Soln (Insulin aspart) .... Inject subc depending blood sugar level. blood sugar 150 to 200 inject 2 units. 200 to 300 use 4 units. 300 to 350 use 7 units. 24)  Lantus Solostar 100 Unit/ml Soln (Insulin glargine) .... Inject 23 units subcutaneously every night. 25)  Sure Comfort Pen Needles 31g X 5 Mm Misc (Insulin pen needle) .... Use to inject insulin 1-4 times a day- please dispense a 4mm or patient preference  Other Orders: MNT/Initial Visit and Intervention, 15 minutes ([id number])

## 2010-06-17 NOTE — Assessment & Plan Note (Signed)
Summary: CHECKUP/SB.   Vital Signs:  Patient profile:   50 year old female Height:      62.5 inches (158.75 cm) Weight:      288.7 pounds (131.23 kg) BMI:     52.15 Temp:     97.4 degrees F (36.33 degrees C) oral Pulse rate:   107 / minute BP sitting:   130 / 70  (right arm)  Vitals Entered By: Stanton Kidney Ditzler RN (Sep 17, 2009 2:26 PM) Is Patient Diabetic? Yes Did you bring your meter with you today? No Pain Assessment Patient in pain? yes      Intensity: 7 Onset of pain  M Nutritional Status BMI of > 30 = obese Nutritional Status Detail appetite fair  Have you ever been in a relationship where you felt threatened, hurt or afraid?denies   Does patient need assistance? Functional Status Self care Ambulation Normal Comments Ck-up - discuss halter monitor results. Refills meds and diabetic supplies - Freestyle lite. Handicapped sticker. CBG 7AM 114 and 10AM 78.   Primary Care Provider:  Hartley Barefoot MD   History of Present Illness: 50 year old pleasent woman Past Medical History: Hypertension Over flow Urinary incontinence Multiple Sclerosis History of Paronichia with Methiciline resistance Staph areus. Diabetes mellitus, type II  She present for medications refills. She had only one low blood sugar since we reduce her insulin, and it was because she didnt eat that day.  She is going to transfer her care to another [facility] doctor.  Ms Man Gets her pain medication from the pain clinic. She gets lyrica and other neuroleptic from her neurologyst. She doesnt have any new complaints. Her holter was normal, sinus rhythem , showed occasional pvc and premature atrial contraction.  Depression History:      The patient denies a depressed mood most of the day and a diminished interest in her usual daily activities.         Preventive Screening-Counseling & Management  Alcohol-Tobacco     Alcohol drinks/day: 0     Smoking Status: current     Smoking Cessation Counseling: yes    Packs/Day: 1/3 ppd     Year Started: AT THE AGE OF 21  Caffeine-Diet-Exercise     Does Patient Exercise: yes     Type of exercise: ROM     Times/week: 5  Current Medications (verified): 1)  Copaxone 20 Mg/ml Kit (Glatiramer Acetate) .... Inject Every Day. 2)  Lyrica 200 Mg Caps (Pregabalin) .... Take 1 Tablet By Mouth Three Times A Day 3)  Flexeril 10 Mg  Tabs (Cyclobenzaprine Hcl) .... Take 1 Tablet By Mouth Once Three Times A Day As Needed. 4)  Losartan Potassium 50 Mg Tabs (Losartan Potassium) .... Take 1 Tablet By Mouth Once A Day 5)  Furosemide 40 Mg Tabs (Furosemide) .... Take 1 Tablet By Mouth Twice A Day 6)  Nexium 40 Mg Cpdr (Esomeprazole Magnesium) .... Take 1 Tablet By Mouth Once A Day 7)  Baclofen 10 Mg Tabs (Baclofen) .... Take 1 Tablet By Mouth Twice A Day As Needed. 8)  Roxicodone 15 Mg Tabs (Oxycodone Hcl) .... Take 1 Tablet By Mouth Three Times A Day 9)  Terazosin Hcl 1 Mg Caps (Terazosin Hcl) .... Take 2 Tabs By Mouth At Bedtime 10)  Hydroxyzine Hcl 25 Mg Tabs (Hydroxyzine Hcl) .... Take 1 Tablet By Mouth Four Times A Day 11)  Opana Er 40 Mg Xr12h-Tab (Oxymorphone Hcl) .... Take 1 Tablet Every 12 Hour For Pain As Needed.  12)  Ambien 10 Mg Tabs (Zolpidem Tartrate) .... Take 1 Tablet Before Bed Time As Needed. Do Not Take It If You Take Baclofen. 13)  Lancets  Misc (Lancets) .... Use As Directed 14)  Bd Insulin Syringe 27.5g X 5/8" 2 Ml Misc (Insulin Syringe-Needle U-100) .... Use As Directed 15)  Freestyle Lite Test  Strp (Glucose Blood) .... Use Strip As Directed. 16)  Glucophage 1000 Mg Tabs (Metformin Hcl) .... Take 1 Tablet Twice A Day. 17)  Metoprolol Tartrate 25 Mg Tabs (Metoprolol Tartrate) .... Take 1 Tablet By Mouth Daily. 18)  Ciprofloxacin Hcl 500 Mg Tabs (Ciprofloxacin Hcl) .... Take 1 Tablet Twice A Day For 3 Days. 19)  Hydrocortisone 0.5 % Crea (Hydrocortisone) .... Apply Once or Twice A Day. 20)  Topamax 100 Mg Tabs (Topiramate) .... Take 1 Tablet At  Bedtime 21)  Trileptal 150 Mg Tabs (Oxcarbazepine) .... Take 1 Tablet Twice A Day. 22)  Imipramine Hcl 50 Mg Tabs (Imipramine Hcl) .... Take 3 Tablet By Mouth At Bedtime. 23)  Novolog Flexpen 100 Unit/ml Soln (Insulin Aspart) .... Inject Subc Depending Blood Sugar Level. Blood Sugar 150 To 200 Inject 2 Units. 200 To 300 Use 4 Units. 300 To 350 Use 7 Units. 24)  Lantus Solostar 100 Unit/ml Soln (Insulin Glargine) .... Inject 23 Units Subcutaneously Every Night. 25)  Sure Comfort Pen Needles 31g X 5 Mm Misc (Insulin Pen Needle) .... Use To Inject Insulin 1-4 Times A Day- Please Dispense A 4mm or Patient Preference  Allergies: 1)  ! Morphine 2)  ! * Codine  Review of Systems  The patient denies fever, chest pain, syncope, dyspnea on exertion, peripheral edema, prolonged cough, headaches, hemoptysis, abdominal pain, melena, and hematochezia.    Physical Exam  General:  alert, well-developed, and well-nourished.   Head:  normocephalic and atraumatic.   Lungs:  normal respiratory effort, no intercostal retractions, and no accessory muscle use.   Heart:  normal rate and regular rhythm.   Abdomen:  soft, non-tender, normal bowel sounds, and no distention.   Extremities:  trace edema.    Impression & Recommendations:  Problem # 1:  DIABETES MELLITUS (ICD-250.00) Continue with current regimen. No due for HBA1 c. Prior HBA1c at goal.  Her updated medication list for this problem includes:    Losartan Potassium 50 Mg Tabs (Losartan potassium) .Marland Kitchen... Take 1 tablet by mouth once a day    Glucophage 1000 Mg Tabs (Metformin hcl) .Marland Kitchen... Take 1 tablet twice a day.    Novolog Flexpen 100 Unit/ml Soln (Insulin aspart) ..... Inject subc depending blood sugar level. blood sugar 150 to 200 inject 2 units. 200 to 300 use 4 units. 300 to 350 use 7 units.    Lantus Solostar 100 Unit/ml Soln (Insulin glargine) ..... Inject 23 units subcutaneously every night.  Problem # 2:  SKIN RASH (ICD-782.1) Improved,  almost resolved with hydrocortisone.  Her updated medication list for this problem includes:    Hydrocortisone 0.5 % Crea (Hydrocortisone) .Marland Kitchen... Apply once or twice a day.  Problem # 3:  TACHYCARDIA (ICD-785.0) I got result of her holter after she left the office. I will call her with the result. Will need to ncrease her betablocker.   Complete Medication List: 1)  Copaxone 20 Mg/ml Kit (Glatiramer acetate) .... Inject every day. 2)  Lyrica 200 Mg Caps (Pregabalin) .... Take 1 tablet by mouth three times a day 3)  Flexeril 10 Mg Tabs (Cyclobenzaprine hcl) .... Take 1 tablet by mouth once three  times a day as needed. 4)  Losartan Potassium 50 Mg Tabs (Losartan potassium) .... Take 1 tablet by mouth once a day 5)  Furosemide 40 Mg Tabs (Furosemide) .... Take 1 tablet by mouth twice a day 6)  Nexium 40 Mg Cpdr (Esomeprazole magnesium) .... Take 1 tablet by mouth once a day 7)  Baclofen 10 Mg Tabs (Baclofen) .... Take 1 tablet by mouth twice a day as needed. 8)  Roxicodone 15 Mg Tabs (Oxycodone hcl) .... Take 1 tablet by mouth three times a day 9)  Terazosin Hcl 1 Mg Caps (Terazosin hcl) .... Take 2 tabs by mouth at bedtime 10)  Hydroxyzine Hcl 25 Mg Tabs (Hydroxyzine hcl) .... Take 1 tablet by mouth four times a day 11)  Opana Er 40 Mg Xr12h-tab (Oxymorphone hcl) .... Take 1 tablet every 12 hour for pain as needed. 12)  Ambien 10 Mg Tabs (Zolpidem tartrate) .... Take 1 tablet before bed time as needed. do not take it if you take baclofen. 13)  Lancets Misc (Lancets) .... Use as directed 14)  Bd Insulin Syringe 27.5g X 5/8" 2 Ml Misc (Insulin syringe-needle u-100) .... Use as directed 15)  Freestyle Lite Test Strp (Glucose blood) .... Use strip as directed. 16)  Glucophage 1000 Mg Tabs (Metformin hcl) .... Take 1 tablet twice a day. 17)  Metoprolol Tartrate 25 Mg Tabs (Metoprolol tartrate) .... Take 1 tablet by mouth daily. 18)  Ciprofloxacin Hcl 500 Mg Tabs (Ciprofloxacin hcl) .... Take 1  tablet twice a day for 3 days. 19)  Hydrocortisone 0.5 % Crea (Hydrocortisone) .... Apply once or twice a day. 20)  Topamax 100 Mg Tabs (Topiramate) .... Take 1 tablet at bedtime 21)  Trileptal 150 Mg Tabs (Oxcarbazepine) .... Take 1 tablet twice a day. 22)  Imipramine Hcl 50 Mg Tabs (Imipramine hcl) .... Take 3 tablet by mouth at bedtime. 23)  Novolog Flexpen 100 Unit/ml Soln (Insulin aspart) .... Inject subc depending blood sugar level. blood sugar 150 to 200 inject 2 units. 200 to 300 use 4 units. 300 to 350 use 7 units. 24)  Lantus Solostar 100 Unit/ml Soln (Insulin glargine) .... Inject 23 units subcutaneously every night. 25)  Sure Comfort Pen Needles 31g X 5 Mm Misc (Insulin pen needle) .... Use to inject insulin 1-4 times a day- please dispense a 4mm or patient preference  Patient Instructions: 1)  Follow up we your new PCP.  Prescriptions: HYDROXYZINE HCL 25 MG TABS (HYDROXYZINE HCL) Take 1 tablet by mouth four times a day  #50 x 1   Entered and Authorized by:   Hartley Barefoot MD   Signed by:   Hartley Barefoot MD on 09/17/2009   Method used:   Print then Give to Patient   RxID:   [id number] FLEXERIL 10 MG  TABS (CYCLOBENZAPRINE HCL) Take 1 tablet by mouth once three times a day as needed.  #30 x 2   Entered and Authorized by:   Hartley Barefoot MD   Signed by:   Hartley Barefoot MD on 09/17/2009   Method used:   Print then Give to Patient   RxID:   [id number] LYRICA 200 MG CAPS (PREGABALIN) Take 1 tablet by mouth three times a day  #90 x 2   Entered and Authorized by:   Hartley Barefoot MD   Signed by:   Hartley Barefoot MD on 09/17/2009   Method used:   Print then Give to Patient   RxID:   [id number] METOPROLOL TARTRATE 25 MG TABS (  METOPROLOL TARTRATE) Take 1 tablet by mouth daily.  #30 x 2   Entered and Authorized by:   Hartley Barefoot MD   Signed by:   Hartley Barefoot MD on 09/17/2009   Method used:   [facility]        [facility] [address] [phone]*  (retail)       [address]       [place], [place]  [address]       Ph: [phone] or [phone]       Fax: [phone]   RxID:   [id number] GLUCOPHAGE 1000 MG TABS (METFORMIN HCL) Take 1 tablet twice a day.  #60 x 2   Entered and Authorized by:   Hartley Barefoot MD   Signed by:   Hartley Barefoot MD on 09/17/2009   Method used:   [facility] to        [facility] [address] [phone]* (retail)       [address]       [place], [place]  [address]       Ph: [phone] or [phone]       Fax: [phone]   RxID:   [id number] FUROSEMIDE 40 MG TABS (FUROSEMIDE) Take 1 tablet by mouth twice a day  #30 x 2   Entered and Authorized by:   Hartley Barefoot MD   Signed by:   Hartley Barefoot MD on 09/17/2009   Method used:   [facility]ronically to        [facility] [place] [address] [phone]* (retail)       [address]       [place], [place]  [address]       Ph: [phone] or [phone]       Fax: [phone]   RxID:   [id number] LOSARTAN POTASSIUM 50 MG TABS (LOSARTAN POTASSIUM) Take 1 tablet by mouth once a day  #30 x 2   Entered and Authorized by:   Hartley Barefoot MD   Signed by:   Hartley Barefoot MD on 09/17/2009   Method used:   [facility]ronically to        [facility] [address] [phone]* (retail)       [address]       [place], [place]  [address]       Ph: [phone] or [phone]       Fax: [phone]   RxID:   [id number] FREESTYLE LITE TEST  STRP (GLUCOSE BLOOD) Use strip as directed.  #1box x 7   Entered and Authorized by:   Hartley Barefoot MD   Signed by:   Hartley Barefoot MD on 09/17/2009   Method used:   [facility]ron[facility] to        [facility] [address] [phone]* (retail)       [address]       [place], [place]  [address]       Ph: [phone] or [phone]       Fax: [phone]   RxID:   [id number]   Prevention & Chronic  Care Immunizations   Influenza vaccine: Fluvax 3+  (02/24/2008)   Influenza vaccine deferral: Not indicated  (02/12/2009)    Tetanus booster: Not documented    Pneumococcal vaccine: Pneumovax  (02/24/2008)  Other Screening  Pap smear: Not documented   Pap smear action/deferral: GYN Referral  (11/28/2008)    Mammogram: BI-RADS CATEGORY 2:  Benign finding(s).^MM DIGITAL DIAG LTD L  (03/12/2009)   Mammogram action/deferral: Ordered  (01/31/2009)   Mammogram due: 02/2009   Smoking status: current  (09/17/2009)   Smoking cessation counseling: yes  (09/17/2009)  Diabetes Mellitus   HgbA1C: 5.8  (08/05/2009)    Eye exam: Not documented    Foot exam: Not documented   Foot exam action/deferral: Do today   High risk foot: Yes  (03/04/2009)   Foot care education: Done  (03/04/2009)   Foot exam due: 06/04/2009    Urine microalbumin/creatinine ratio: 19.6  (05/03/2009)   Urine microalbumin action/deferral: Ordered  Lipids   Total Cholesterol: 227  (03/26/2008)   LDL: 116  (03/26/2008)   LDL Direct: Not documented   HDL: 68  (03/26/2008)   Triglycerides: 216  (03/26/2008)   Lipid panel due: 05/31/2009  Hypertension   Last Blood Pressure: 130 / 70  (09/17/2009)   Serum creatinine: 0.92  (08/05/2009)   Serum potassium 4.0  (08/05/2009)  Self-Management Support :   Personal Goals (by the next clinic visit) :     Personal A1C goal: 7  (03/04/2009)   Patient will work on the following items until the next clinic visit to reach self-care goals:     Medications and monitoring: take my medicines every day, check my blood sugar, bring all of my medications to every visit, examine my feet every day  (09/17/2009)     Eating: drink diet soda or water instead of juice or soda, eat more vegetables, use fresh or frozen vegetables, eat foods that are low in salt, eat baked foods instead of fried foods, eat fruit for snacks and desserts, limit or avoid alcohol  (09/17/2009)     Activity: take  a 30 minute walk every day, park at the far end of the parking lot  (09/17/2009)     Other: Spoke with Jamison Neighbor this morning. See Office Visit on 03-04-09.  (03/04/2009)    Diabetes self-management support: Written self-care plan, Education handout, Resources for patients handout  (09/17/2009)   Diabetes care plan printed   Diabetes education handout printed   Last diabetes self-management training by diabetes educator: 08/19/2009    Hypertension self-management support: Written self-care plan, Education handout, Resources for patients handout  (09/17/2009)   Hypertension self-care plan printed.   Hypertension education handout printed      Resource handout printed.

## 2010-06-17 NOTE — Assessment & Plan Note (Signed)
Summary: diabetes teaching/cfb   Vital Signs:  Patient profile:   50 year old female Weight:      280.9 pounds BMI:     50.74 Is Patient Diabetic? Yes Did you bring your meter with you today? Yes CBG Device ID Freestyle Lite   Allergies: 1)  ! Morphine 2)  ! * Codine   Complete Medication List: 1)  Copaxone 20 Mg/ml Kit (Glatiramer acetate) .... Inject every day. 2)  Lyrica 200 Mg Caps (Pregabalin) .... Take 1 tablet by mouth three times a day 3)  Flexeril 10 Mg Tabs (Cyclobenzaprine hcl) .... Take 1 tablet by mouth once three times a day as needed. 4)  Losartan Potassium 50 Mg Tabs (Losartan potassium) .... Take 1 tablet by mouth once a day 5)  Furosemide 40 Mg Tabs (Furosemide) .... Take 1 tablet by mouth twice a day 6)  Nexium 40 Mg Cpdr (Esomeprazole magnesium) .... Take 1 tablet by mouth once a day 7)  Baclofen 10 Mg Tabs (Baclofen) .... Take 1 tablet by mouth twice a day as needed. 8)  Roxicodone 15 Mg Tabs (Oxycodone hcl) .... Take 1 tablet by mouth three times a day 9)  Terazosin Hcl 1 Mg Caps (Terazosin hcl) .... Take 2 tabs by mouth at bedtime 10)  Hydroxyzine Hcl 25 Mg Tabs (Hydroxyzine hcl) .... Take 1 tablet by mouth four times a day 11)  Opana Er 40 Mg Xr12h-tab (Oxymorphone hcl) .... Take 1 tablet every 12 hour for pain as needed. 12)  Ambien 10 Mg Tabs (Zolpidem tartrate) .... Take 1 tablet before bed time as needed. do not take it if you take baclofen. 13)  Lantus 100 Unit/ml Soln (Insulin glargine) .... Inject 30 units subcutaneously every night. 14)  Lancets Misc (Lancets) .... Use as directed 15)  Bd Insulin Syringe 27.5g X 5/8" 2 Ml Misc (Insulin syringe-needle u-100) .... Use as directed 16)  Prodigy Autocode Blood Glucose Strp (Glucose blood) .... Use to test blood sugar 4-6x daily 17)  Glucophage 1000 Mg Tabs (Metformin hcl) .... Take 1 tablet twice a day. 18)  Metoprolol Tartrate 25 Mg Tabs (Metoprolol tartrate) .... Take 1 tablet by mouth daily. 19)   Ciprofloxacin Hcl 500 Mg Tabs (Ciprofloxacin hcl) .... Take 1 tablet twice a day for 3 days.  Other Orders: DSMT(Medicare) Individual, 30 Minutes (G0108)  Diabetes Self Management Training  PCP: Hartley Barefoot MD Date diagnosed with diabetes: 02/18/2009 Diabetes Type: Type 2 insulin Other persons present: no Current smoking Status: current  Vital Signs Todays Weight: 280.9lb  in BMI 50.74in-lbs   Assessment Daily activities: busy self managing medications- wants to go back to school for finance- Special needs or Barriers: in pain often   Diabetes Medications:  Comments: when she was on metformin 1000 mg twice a day- premeal and fasting blood sugar 70-90 range, when off metfomrin because she ran out- premeal blood sugar s were 90-120, range of blood sugars over last month was 65-123 premeal and post meal. weight increased, difficulty with increasing physical activity due to pain. now using freestyle meter- advised her to decrease testing to 2x/day as she is receiving patient assistance and may run out of strips otherwise. receommended one premeal an done post meal blood sugar check daily. patient giivng second back up freestyle lite meter today to carry in her purse with her.  Long Acting  Insulin Type:Lantus  Bedtime Dose: 30 units    Monitoring Self monitoring blood glucose 2 times a day Name of Meter  Freestyle Lite Measures urine ketones? No  Time of Testing  Before Breakfast  After Dinner  Recent Episodes of: Requiring Help from another person  Hyperglycemia : No Hypoglycemia: No  Other Assessment Had an amputation due to diabetes? No Ever had a foot ulcer or infection?           No Performs daily self-foot exams: Yes   Wears Medical I.D. Yes Carrys Food for Low Blood sugar Yes Can you tell if your blood sugar is low? Yes   Estimated /Usual Carb Intake Breakfast # of Carbs/Grams sugared cold creeal aor oatment or light yogurt Lunch # of Carbs/Grams  yogurt or fruit Dinner # of Carbs/Grams pizza, fried chicken or pork, starcha nd vegetables Bedtime # of Carbs/Grams light popcorn  Nutrition assessment Weight change: Gain Amount of change: about 4 pounds in past month Biggest challenge to eating healthy: Eating too much  Activity Limitations  Inadequate physical activity Diabetes Disease Process  Discussed today  Medications  Nutritional Management Identify what foods most often affect blood glucose: Needs review/assistance     Monitoring State purpose and frequency of monitoring BG-ketones-HgbA1C  : Needs review/assistance   State target blood glucose and HgbA1C goals: Demonstrates competency      BEHAVIORAL GOAL FOLLOW UP Incorporating physical activity into lifestyle: Always Incorporating appropriate nutritional management: Sometimes Monitoring blood glucose levels daily: 50%of the time Specific goal set today: was not ready for goal setting in regards to food intake today, set new goal to do 25 squats 3-4 days a week and mark on chart      Quit smoking progress: 100% met goal- a pack  lasts her 4.5 days now.   Diabetes Self Management Support: family, CDE and doctor office staff Follow-up:4-6 weeks for reveiw of diabetes self mangment skills/knowledge

## 2010-06-17 NOTE — Progress Notes (Signed)
Summary: solumedrol and blood sugar/ hla  Phone Note Call from Patient   Summary of Call: pt called to say she is being put on solumedrol iv by dr Thad Ranger- [facility]- at home for 3 days, she is concerned about her diab and blood sugars. please advise asap ph# [phone] Initial call taken by: Marin Roberts RN,  July 24, 2009 4:01 PM    New/Updated Medications: NOVOLOG 100 UNIT/ML SOLN (INSULIN ASPART) inject subc depending blood sugar level. Blood sugar 150 to 200 inject 2 units. 200 to 300 use 4 units. 300 to 350 use 7 units. I called patient and instruct her to use SSI.   Appended Document: solumedrol and blood sugar/ [facility] [place], pt aware

## 2010-06-17 NOTE — Progress Notes (Signed)
Summary: Refill/gh  Phone Note Refill Request Message from:  Fax from Pharmacy on January 17, 2010 2:08 PM  Refills Requested: Medication #1:  METOPROLOL TARTRATE 25 MG TABS Take 1 tablet by mouth daily.   Last Refilled: 12/14/2009 Last office visit was 10/15/2009.  Last labs were 08/05/2009.   Method Requested: Electronic Initial call taken by: Angelina Ok RN,  January 17, 2010 2:09 PM  Follow-up for Phone Call        Last seen and last A1C May. No appts in EMR. Sent flag to Ms Lissa Hoard and inclued note to pt on RX tha she needs an appt. Follow-up by: Blanch Media MD,  January 17, 2010 3:31 PM    New/Updated Medications: METOPROLOL TARTRATE 25 MG TABS (METOPROLOL TARTRATE) Take 1 tablet by mouth daily for your high blood pressure. Please call the clinic at [phone] to make an appt to receive additional refills. Prescriptions: METOPROLOL TARTRATE 25 MG TABS (METOPROLOL TARTRATE) Take 1 tablet by mouth daily for your high blood pressure. Please call the clinic at [phone] to make an appt to receive additional refills.  #31 x 1   Entered and Authorized by:   Blanch Media MD   Signed by:   Blanch Media MD on 01/17/2010   Method used:   [facility]        [facility] [address] [phone]* (retail)       [address]       [place], [place]  [address]       Ph: [phone] or [phone]       Fax: [phone]   RxID:   [id number]

## 2010-06-17 NOTE — Progress Notes (Signed)
Summary: REfill/gh  Phone Note Refill Request Message from:  Fax from Pharmacy on January 13, 2010 11:27 AM  Refills Requested: Medication #1:  LOSARTAN POTASSIUM 50 MG TABS Take 1 tablet by mouth once a day   Last Refilled: 12/14/2009 Last office visit and labs 08/05/2009.   Method Requested: Electronic Initial call taken by: Angelina Ok RN,  January 13, 2010 11:28 AM  Follow-up for Phone Call        May 16109 was last appt. Last A1C was March. sent flag to Ms Lissa Hoard to schedule appt Follow-up by: Blanch Media MD,  January 13, 2010 12:23 PM    Prescriptions: Claris Gladden POTASSIUM 50 MG TABS (LOSARTAN POTASSIUM) Take 1 tablet by mouth once a day  #30 x 2   Entered and Authorized by:   Blanch Media MD   Signed by:   Blanch Media MD on 01/13/2010   Method used:   [facility] to        [facility] [address] [phone]* (retail)       [address]       [place], [place]  [address]       Ph: [phone] or [phone]       Fax: [phone]   RxID:   [id number]

## 2010-06-17 NOTE — Progress Notes (Signed)
Summary: hypoglycemia/dmr  Phone Note Call from Patient Call back at Home Phone [phone]   Caller: Patient Summary of Call: patient called saying that she got up early today very hungry- ate a bowl of cereal checked blood sugar nad it was 100 and went back to bed, awoke presently with symptoms of hypoglycemia, checked her blood sugar and it was 53 mg/dl- she wa sonher way to get orange juice. She injected 26 units last night- advised her to decrease her lantue tonight to 25 units. Suspect she is doing a better job of watching her food intake after out calorie counting session this week and is requiring less insulin.   she will recheck 15 minutes after 8 oz orange juice and if further problems she will call us. Initial call taken by: Jamison Neighbor RD,CDE,  August 09, 2009 11:01 AM    New/Updated Medications: LANTUS SOLOSTAR 100 UNIT/ML SOLN (INSULIN GLARGINE) Inject 25 Units subcutaneously every night. That sounds good, We might need to decrease lantus even more, maybe 20 to 23 units.   Appended Document: hypoglycemia/dmr called patient back go tell her to lower her lantus dose to 23 units. she ackowledged this order. she said that her CBgs today have been 53/then 79, 30 minutes later and more recently 90. encouraged her ot continue excellent self care.

## 2010-06-17 NOTE — Letter (Signed)
Summary: LOGBOOK REPORT  LOGBOOK REPORT   Imported By: Shon Hough 12/09/2009 15:25:06  _____________________________________________________________________  External Attachment:    Type:   Image     Comment:   External Document

## 2010-06-17 NOTE — Progress Notes (Signed)
Summary: starting steroids.requesting Novolog instructions/dmr  Phone Note Call from Patient   Caller: Patient Call For: Novolog doses on steroids/CDE Summary of Call: starting steriods for a few days and was was switched to insulin pen & not informed how much Novolog to take to keep sugars controlled. please assist. called back at both phone numberst- did not see steroids on ER notebut patient says  Dr. Thad Ranger is starting her on steroids-read correction scale as written in her medication record to her. she will call if staying over 350mg /dl  [phone]

## 2010-06-17 NOTE — Progress Notes (Signed)
Summary: Prior Authorization / Losartan  Phone Note Outgoing Call   Call placed by: Angelina Ok RN,  July 31, 2009 11:59 AM Call placed to: Insurer Summary of Call: Prior Authorization approved for Cozaar 5o mg tablets. 07/31/2009 thru 07/31/2010. Initial call taken by: Angelina Ok RN,  July 31, 2009 11:59 AM    New/Updated Medications: LOSARTAN POTASSIUM 50 MG TABS (LOSARTAN POTASSIUM) Take 1 tablet by mouth once a day

## 2010-06-17 NOTE — Progress Notes (Signed)
Summary: Refill/gh  Phone Note Refill Request Message from:  Patient on July 31, 2009 11:45 AM  Refills Requested: Medication #1:  TERAZOSIN HCL 1 MG CAPS Take 2 tabs by mouth at bedtime   Last Refilled: 06/20/2009  Method Requested: Electronic Initial call taken by: Angelina Ok RN,  July 31, 2009 11:45 AM    Prescriptions: TERAZOSIN HCL 1 MG CAPS (TERAZOSIN HCL) Take 2 tabs by mouth at bedtime  #60 x 4   Entered and Authorized by:   Hartley Barefoot MD   Signed by:   Hartley Barefoot MD on 07/31/2009   Method used:   [facility]        [facility] [address] [phone]* (retail)       [address]       [place], [place]  [address]       Ph: [phone] or [phone]       Fax: [phone]   RxID:   [id number]

## 2010-06-17 NOTE — Progress Notes (Signed)
Summary: refill/gg  Phone Note Refill Request  on June 19, 2009 11:50 AM  Refills Requested: Medication #1:  HYDROXYZINE HCL 25 MG TABS Take 1 tablet by mouth four times a day  Method Requested: Electronic Initial call taken by: Merrie Roof RN,  June 19, 2009 11:57 AM    Prescriptions: HYDROXYZINE HCL 25 MG TABS (HYDROXYZINE HCL) Take 1 tablet by mouth four times a day  #50 x 3   Entered and Authorized by:   Hartley Barefoot MD   Signed by:   Hartley Barefoot MD on 06/19/2009   Method used:   [facility]        [facility] [address] [phone]* (retail)       [address]       [place], [place]  [address]       Ph: [phone] or [phone]       Fax: [phone]   RxID:   [id number]

## 2010-06-17 NOTE — Progress Notes (Signed)
Summary: change in lantus dose  ---- Converted from flag ---- ---- 06/13/2009 2:39 PM, Hartley Barefoot MD wrote: I think thats ok we can decrease her insulin, would you let her know. thanks.   ---- 06/12/2009 2:41 PM, Jamison Neighbor RD,CDE wrote: [name]- would like to see [name] take off some weight- not sure where she is in readiness: was somewhat in denial today of ways she could cut calories... anyway- in her meter an dlogbook looked like maybe she could cut insulin dose a little bit and still maintain blood sugars in target- maybe 28 units a day?   I often struggle with good control vs weight loss , but on full dose metformin, I think she'll have excellent control. Just wanted to put that idea out there...   [name] ------------------------------  Phone Note Outgoing Call   Call placed by: Jamison Neighbor RD,CDE,  June 13, 2009 2:49 PM Summary of Call: spoke wiht patient who acknowledged order to decrease her lantus insulin to 28 units eachday- said she and doctor could reveiw her blood sugars on monday at her appointment.    New/Updated Medications: LANTUS 100 UNIT/ML SOLN (INSULIN GLARGINE) Inject 28 Units subcutaneously every night.

## 2010-06-17 NOTE — Letter (Signed)
Summary: Pharmacologist   Imported By: Florinda Marker 08/12/2009 10:35:44  _____________________________________________________________________  External Attachment:    Type:   Image     Comment:   External Document

## 2010-06-17 NOTE — Letter (Signed)
Summary: Pharmacologist   Imported By: Florinda Marker 08/06/2009 14:43:59  _____________________________________________________________________  External Attachment:    Type:   Image     Comment:   External Document

## 2010-06-18 ENCOUNTER — Telehealth: Payer: Self-pay | Admitting: Cardiology

## 2010-06-18 ENCOUNTER — Ambulatory Visit (HOSPITAL_COMMUNITY): Payer: Medicare Other | Attending: Orthopedic Surgery

## 2010-06-18 ENCOUNTER — Other Ambulatory Visit (HOSPITAL_COMMUNITY): Payer: Medicare Other

## 2010-06-18 DIAGNOSIS — Z538 Procedure and treatment not carried out for other reasons: Secondary | ICD-10-CM | POA: Insufficient documentation

## 2010-06-18 DIAGNOSIS — M1710 Unilateral primary osteoarthritis, unspecified knee: Secondary | ICD-10-CM | POA: Insufficient documentation

## 2010-06-18 LAB — URINALYSIS, ROUTINE W REFLEX MICROSCOPIC
Bilirubin Urine: NEGATIVE
Hgb urine dipstick: NEGATIVE
Ketones, ur: NEGATIVE mg/dL
Nitrite: NEGATIVE
Protein, ur: NEGATIVE mg/dL
Specific Gravity, Urine: 1.013 (ref 1.005–1.030)
Urine Glucose, Fasting: NEGATIVE mg/dL
Urobilinogen, UA: 1 mg/dL (ref 0.0–1.0)
pH: 6.5 (ref 5.0–8.0)

## 2010-06-18 LAB — DIFFERENTIAL
Basophils Absolute: 0 10*3/uL (ref 0.0–0.1)
Basophils Relative: 0 % (ref 0–1)
Eosinophils Absolute: 0.1 10*3/uL (ref 0.0–0.7)
Eosinophils Relative: 1 % (ref 0–5)
Lymphocytes Relative: 52 % — ABNORMAL HIGH (ref 12–46)
Lymphs Abs: 4.4 10*3/uL — ABNORMAL HIGH (ref 0.7–4.0)
Monocytes Absolute: 0.6 10*3/uL (ref 0.1–1.0)
Monocytes Relative: 7 % (ref 3–12)
Neutro Abs: 3.2 10*3/uL (ref 1.7–7.7)
Neutrophils Relative %: 38 % — ABNORMAL LOW (ref 43–77)

## 2010-06-18 LAB — SURGICAL PCR SCREEN
MRSA, PCR: NEGATIVE
Staphylococcus aureus: NEGATIVE

## 2010-06-18 LAB — BASIC METABOLIC PANEL
GFR calc non Af Amer: >60 mL/min (ref >60)
Potassium: 3.7 mEq/L (ref 3.5–5.1)
Sodium: 144 mEq/L (ref 135–145)

## 2010-06-18 LAB — CBC
Platelets: 281 10*3/uL (ref 150–400)
RDW: 13 % (ref 11.5–15.5)
WBC: 8.4 10*3/uL (ref 4.0–10.5)

## 2010-06-18 LAB — APTT: aPTT: 32 seconds (ref 24–37)

## 2010-06-18 LAB — PROTIME-INR
INR: 0.97 (ref 0.00–1.49)
Prothrombin Time: 13.1 s (ref 11.6–15.2)

## 2010-06-19 ENCOUNTER — Ambulatory Visit: Admit: 2010-06-19 | Payer: Self-pay | Admitting: Physical Medicine & Rehabilitation

## 2010-06-19 ENCOUNTER — Ambulatory Visit: Payer: Medicare Other

## 2010-06-19 ENCOUNTER — Ambulatory Visit: Payer: Medicare Other | Attending: Physical Medicine & Rehabilitation

## 2010-06-19 DIAGNOSIS — E1149 Type 2 diabetes mellitus with other diabetic neurological complication: Secondary | ICD-10-CM | POA: Insufficient documentation

## 2010-06-19 DIAGNOSIS — E1142 Type 2 diabetes mellitus with diabetic polyneuropathy: Secondary | ICD-10-CM | POA: Insufficient documentation

## 2010-06-19 DIAGNOSIS — G894 Chronic pain syndrome: Secondary | ICD-10-CM

## 2010-06-19 DIAGNOSIS — E669 Obesity, unspecified: Secondary | ICD-10-CM

## 2010-06-19 DIAGNOSIS — M47817 Spondylosis without myelopathy or radiculopathy, lumbosacral region: Secondary | ICD-10-CM | POA: Insufficient documentation

## 2010-06-19 DIAGNOSIS — IMO0002 Reserved for concepts with insufficient information to code with codable children: Secondary | ICD-10-CM

## 2010-06-19 DIAGNOSIS — G35 Multiple sclerosis: Secondary | ICD-10-CM

## 2010-06-19 DIAGNOSIS — M7989 Other specified soft tissue disorders: Secondary | ICD-10-CM | POA: Insufficient documentation

## 2010-06-19 DIAGNOSIS — M1710 Unilateral primary osteoarthritis, unspecified knee: Secondary | ICD-10-CM | POA: Insufficient documentation

## 2010-06-19 NOTE — Assessment & Plan Note (Signed)
Summary: Pulmonary/ new pt eval for preop clearance   Visit Type:  Initial Consult Copy to:  Dr. Hermelinda Dellen Primary Provider/Referring Provider:  Dr. Jeralyn Ruths  CC:  Dynpnea/needs pulm clearance for TKR.Marland Kitchen  History of Present Illness: 81 yobf with MS x 2008 active smoker with L > R leg weakness and numbness able to walk room to room at baseline but uses motorized w/c whenever leaves home  June 02, 2010 ov for L TKR.  able to sleep on 2 pillows breathing is not limiting. am cough minimal white sputum worse in winter x sev years.  Pt denies any significant sore throat, dysphagia, itching, sneezing,  nasal congestion or excess secretions,  fever, chills, sweats, unintended wt loss, pleuritic or exertional cp, hempoptysis, change in activity tolerance  orthopnea pnd or leg swelling Pt also denies any obvious fluctuation in symptoms with weather or environmental change or other alleviating or aggravating factors.  on no resp meds     Current Medications (verified): 1)  Tysabri 300 Mg/89ml Conc (Natalizumab) .Marland Kitchen.. 1 Inj Every 4 Wks 2)  Flexeril 10 Mg  Tabs (Cyclobenzaprine Hcl) .Marland Kitchen.. 1 Every 12 Hrs 3)  Losartan Potassium 50 Mg Tabs (Losartan Potassium) .... Take 1 Tablet By Mouth Once A Day 4)  Furosemide 40 Mg Tabs (Furosemide) .Marland Kitchen.. 1 Once Daily 5)  Nexium 40 Mg Cpdr (Esomeprazole Magnesium) .... Take 1 Tablet By Mouth Once A Day 6)  Baclofen 10 Mg Tabs (Baclofen) .... Take 1 Tablet By Mouth Twice A Day 7)  Roxicodone 15 Mg Tabs (Oxycodone Hcl) .Marland Kitchen.. 1 2 To 3 Times Per Day 8)  Hydroxyzine Hcl 25 Mg Tabs (Hydroxyzine Hcl) .... Take 1 Tablet By Mouth Three Times Daily.  If Needed, A Fourth Pill Can Be Taken.  This Medicine Is For Itching. 9)  Opana Er 40 Mg Xr12h-Tab (Oxymorphone Hcl) .... Take 1 Tablet Every 12 Hours 10)  Glucophage 1000 Mg Tabs (Metformin Hcl) .... Take 1 Tablet Twice A Day. 11)  Metoprolol Tartrate 25 Mg Tabs (Metoprolol Tartrate) .Marland Kitchen.. 1 Every 12 Hrs 12)  Topamax 100 Mg Tabs  (Topiramate) .... Take 1 Tablet At Bedtime 13)  Trileptal 150 Mg Tabs (Oxcarbazepine) .... Take 1 Tablet Twice A Day. 14)  Imipramine Hcl 50 Mg Tabs (Imipramine Hcl) .... 2 Every 12 Hrs 15)  Novolog Flexpen 100 Unit/ml Soln (Insulin Aspart) .... Inject Subc Depending Blood Sugar Level. Blood Sugar 150 To 200 Inject 2 Units. 200 To 300 Use 4 Units. 300 To 350 Use 7 Units. 16)  Lantus Solostar 100 Unit/ml Soln (Insulin Glargine) .... Inject 17 Units Subcutaneously Every Night. 17)  Crestor 10 Mg Tabs (Rosuvastatin Calcium) .Marland Kitchen.. 1 At Bedtime 18)  Terazosin Hcl 1 Mg Caps (Terazosin Hcl) .... 2 At Bedtime 19)  Diclofenac Potassium 50 Mg Tabs (Diclofenac Potassium) .Marland Kitchen.. 1 Every 12 Hrs 20)  Venlafaxine Hcl 75 Mg Xr24h-Cap (Venlafaxine Hcl) .Marland Kitchen.. 1 Every 12 Hrs  Allergies (verified): 1)  ! Morphine 2)  ! * Codine  Past History:  Past Medical History: Hypertension Over flow Urinary incontinence Multiple Sclerosis History of Paronichia with Methiciline resistance Staph areus. Diabetes mellitus, type II Chronic bronchitis, clinical dx     - See consult June 02, 2010   Past Surgical History: Partial hysterectomy  tubal ligation 1984  Family History: Diabetes- mother Negative for respiratory diseases or atopy   Social History: Separated.  Disabled.  Denies EtOH.  Denies caffeine use. Son is living with her and helping her out. Cutting back on  smoking as of 02/2009 Current smoker since age 5.  Smoked 1/3 ppd.   Review of Systems       The patient complains of shortness of breath with activity, irregular heartbeats, weight change, joint stiffness or pain, and change in color of mucus.  The patient denies shortness of breath at rest, productive cough, non-productive cough, coughing up blood, chest pain, acid heartburn, indigestion, loss of appetite, abdominal pain, difficulty swallowing, sore throat, tooth/dental problems, headaches, nasal congestion/difficulty breathing through nose,  sneezing, itching, ear ache, anxiety, depression, hand/feet swelling, rash, and fever.    Vital Signs:  Patient profile:   50 year old female Weight:      296.25 pounds O2 Sat:      100 % on Room air Temp:     98.1 degrees F oral Pulse rate:   110 / minute BP sitting:   124 / 78  (left arm) Cuff size:   large  Vitals Entered By: Vernie Murders (June 02, 2010 10:23 AM)  O2 Flow:  Room air  Physical Exam  Additional Exam:  wt 296 June 02, 2010  HEENT: nl dentition, turbinates, and orophanx. Nl external ear canals without cough reflex NECK :  without JVD/Nodes/TM/ nl carotid upstrokes bilaterally LUNGS: no acc muscle use, clear to A and P bilaterally without cough on insp or exp maneuvers CV:  RRR  no s3 or murmur or increase in P2, no edema  ABD:  obese o/w benign. No bruits or organomegaly, bowel sounds nl MS:  warm without deformities, calf tenderness, cyanosis or clubbing SKIN: warm and dry without lesions   NEURO:  alert, approp, no deficits     Impression & Recommendations:  Problem # 1:  SIMPLE CHRONIC BRONCHITIS (ICD-491.0) This is the earliest manifestation of copd and do not detect any sign associated airflow obstrution so should do fine with surgery if stops smoking for 2 weeks preop  Problem # 2:  SMOKER (ICD-305.1)  I emphasized that although we never turn away smokers from the pulmonary clinic, we do ask that they understand that the recommendations that were made won't work nearly as well in the presence of continued cigarette exposure and we may reach a point where we can't help the patient if he/she can't quit smoking.    I reviewed the Flethcher curve with her that basically says if you quit smoking when your best day FEV1 is still well preserved it is highly unlikely you will progress to severe disease and informed the patient there was no medication on the market that has proven to change the curve or the likelihood of progression   Medications Added to  Medication List This Visit: 1)  Tysabri 300 Mg/22ml Conc (Natalizumab) .Marland Kitchen.. 1 inj every 4 wks 2)  Flexeril 10 Mg Tabs (Cyclobenzaprine hcl) .Marland Kitchen.. 1 every 12 hrs 3)  Furosemide 40 Mg Tabs (Furosemide) .Marland Kitchen.. 1 once daily 4)  Baclofen 10 Mg Tabs (Baclofen) .... Take 1 tablet by mouth twice a day 5)  Roxicodone 15 Mg Tabs (Oxycodone hcl) .Marland Kitchen.. 1 2 to 3 times per day 6)  Opana Er 40 Mg Xr12h-tab (Oxymorphone hcl) .... Take 1 tablet every 12 hours 7)  Metoprolol Tartrate 25 Mg Tabs (Metoprolol tartrate) .Marland Kitchen.. 1 every 12 hrs 8)  Imipramine Hcl 50 Mg Tabs (Imipramine hcl) .... 2 every 12 hrs 9)  Lantus Solostar 100 Unit/ml Soln (Insulin glargine) .... Inject 17 units subcutaneously every night. 10)  Crestor 10 Mg Tabs (Rosuvastatin calcium) .Marland Kitchen.. 1 at bedtime  11)  Terazosin Hcl 1 Mg Caps (Terazosin hcl) .... 2 at bedtime 12)  Diclofenac Potassium 50 Mg Tabs (Diclofenac potassium) .Marland Kitchen.. 1 every 12 hrs 13)  Venlafaxine Hcl 75 Mg Xr24h-cap (Venlafaxine hcl) .Marland Kitchen.. 1 every 12 hrs  Patient Instructions: 1)  You must stop smoking if all possible for 2 weeks preop but you are cleared for surgery from a pulmonary perspective.  Appended Document: Orders Update    Clinical Lists Changes  Orders: Added new Service order of New Patient Level V [phone]) - Signed

## 2010-06-19 NOTE — Letter (Signed)
Summary: Medicaid Transportation  Medicaid Transportation   Imported By: Marylou Mccoy 06/13/2010 10:26:13  _____________________________________________________________________  External Attachment:    Type:   Image     Comment:   External Document

## 2010-06-19 NOTE — Progress Notes (Signed)
Summary: Nuclear pre procedure  Phone Note Outgoing Call Call back at Home Phone [phone]   Summary of Call: Reviewed information on Myoview Information Sheet (see scanned document for further details).  Burna Mortimer spoke with patient.      Nuclear Med Background Indications for Stress Test: Evaluation for Ischemia, Surgical Clearance, Post Hospital  Indications Comments: 06/11/10 chest pain, negative enzymes. Pending (L) knee surgery.   History: Asthma, Echo  History Comments: '09 Echo:EF=60-65%  Symptoms: Chest Pain, DOE, Palpitations, SOB    Nuclear Pre-Procedure Cardiac Risk Factors: Hypertension, IDDM Type 2, Lipids, Smoker Height (in): 62.5  Nuclear Med Study Referring MD:  visit #6 /10

## 2010-06-19 NOTE — Assessment & Plan Note (Signed)
Summary: sur/ knee surgery-mb   Visit Type:  NP Primary Provider:  Jeralyn Ruths  CC:  shortness of breath and swelling in feet.  History of Present Illness: 50 yo with history of MS, obesity, HTN, diabetes, chronic dyspnea presents for cardiology evaluation prior to left total knee replacement.  Patient uses a motorized scooter when out of the house because of left knee pain, leg weakness from MS, and foot pain from her diabetic neuropathy.  She is able to walk short distances in her house with a cane.  She is short of breath with housework such as making up beds and she gets short of breath washing herself in the shower.  No chest pain.  She is smoking about 1/3 ppd.  Her heart rate is elevated in the 100s today, sinus tachycardia.  She says that she has been told that her heart rate is high for at least a year. She feels palpitations fairly frequently.  BP is high at 150/94 today.   ECG: sinus tachycardia at 109, otherwise normal  Labs (1/12): K 4.3, creatinine 0.85, LDL 106, HDL 87  Current Medications (verified): 1)  Tysabri 300 Mg/52ml Conc (Natalizumab) .Marland Kitchen.. 1 Inj Every 4 Wks 2)  Flexeril 10 Mg  Tabs (Cyclobenzaprine Hcl) .Marland Kitchen.. 1 Every 12 Hrs 3)  Losartan Potassium 50 Mg Tabs (Losartan Potassium) .... Take 1 Tablet By Mouth Once A Day 4)  Furosemide 40 Mg Tabs (Furosemide) .Marland Kitchen.. 1 Once Daily 5)  Nexium 40 Mg Cpdr (Esomeprazole Magnesium) .... Take 1 Tablet By Mouth Once A Day 6)  Baclofen 10 Mg Tabs (Baclofen) .... Take 1 Tablet By Mouth Twice A Day 7)  Roxicodone 15 Mg Tabs (Oxycodone Hcl) .Marland Kitchen.. 1 2 To 3 Times Per Day 8)  Hydroxyzine Hcl 25 Mg Tabs (Hydroxyzine Hcl) .... Take 1 Tablet By Mouth Three Times Daily.  If Needed, A Fourth Pill Can Be Taken.  This Medicine Is For Itching. 9)  Opana Er 40 Mg Xr12h-Tab (Oxymorphone Hcl) .... Take 1 Tablet Every 12 Hours 10)  Glucophage 1000 Mg Tabs (Metformin Hcl) .... Take 1 Tablet Twice A Day. 11)  Metoprolol Tartrate 25 Mg Tabs (Metoprolol  Tartrate) .Marland Kitchen.. 1 Every 12 Hrs 12)  Topamax 100 Mg Tabs (Topiramate) .... Take 1 Tablet At Bedtime 13)  Trileptal 150 Mg Tabs (Oxcarbazepine) .... Take 1 Tablet Twice A Day. 14)  Imipramine Hcl 50 Mg Tabs (Imipramine Hcl) .... 2 Every 12 Hrs 15)  Novolog Flexpen 100 Unit/ml Soln (Insulin Aspart) .... Inject Subc Depending Blood Sugar Level. Blood Sugar 150 To 200 Inject 2 Units. 200 To 300 Use 4 Units. 300 To 350 Use 7 Units. 16)  Lantus Solostar 100 Unit/ml Soln (Insulin Glargine) .... Inject 17 Units Subcutaneously Every Night. 17)  Crestor 10 Mg Tabs (Rosuvastatin Calcium) .Marland Kitchen.. 1 At Bedtime 18)  Terazosin Hcl 1 Mg Caps (Terazosin Hcl) .... 2 At Bedtime 19)  Diclofenac Potassium 50 Mg Tabs (Diclofenac Potassium) .Marland Kitchen.. 1 Every 12 Hrs 20)  Venlafaxine Hcl 75 Mg Xr24h-Cap (Venlafaxine Hcl) .Marland Kitchen.. 1 Every 12 Hrs  Allergies (verified): 1)  ! Morphine 2)  ! * Codine  Past History:  Past Medical History: 1. Hypertension 2. Overflow Urinary incontinence 3. Multiple Sclerosis: diagnosed in 2008 4. Paronychia with MRSA. 5. Diabetes mellitus, type II with neuropathy 6. Chronic bronchitis, clinical dx: Active smoker     - See consult June 02, 2010 (Dr. Sherene Sires) 7. Morbid obesity 8. Hyperlipidemia 9. Restless leg syndrome 10. Chronic pain 11. Degenerative disc  disease 12. OSA: Not using CPAP (did not tolerate) 13. Chronic dyspnea 14. Sinus tachycardia 15. OA left knee 16. Active smoker  Family History: Diabetes- mother Negative for respiratory diseases or atopy  No premature CAD  Social History: Separated.  Disabled.  Denies EtOH.  Denies caffeine use. Son is living with her and helping her out. Cutting back on smoking as of 02/2009 Current smoker since age 67.  Smokes 1/3 ppd.  Rides in motorized scooter when out of the house.   Review of Systems       All systems reviewed and negative except as per HPI.   Vital Signs:  Patient profile:   50 year old female Height:      62.5  inches Weight:      296.50 pounds BMI:     53.56 Pulse rate:   109 / minute BP sitting:   150 / 94  (left arm) Cuff size:   regular  Vitals Entered By: Caralee Ates CMA (June 06, 2010 11:59 AM)  Physical Exam  General:  Well developed, well nourished, in no acute distress.  Morbidly obese.  Head:  normocephalic and atraumatic Nose:  no deformity, discharge, inflammation, or lesions Mouth:  Teeth, gums and palate normal. Oral mucosa normal. Neck:  Neck thick, no JVD. No masses, thyromegaly or abnormal cervical nodes. Lungs:  Clear bilaterally to auscultation and percussion. Heart:  Non-displaced PMI, chest non-tender; regular rate and rhythm, S1, S2 without murmurs, rubs or gallops. Carotid upstroke normal, no bruit. Pedals normal pulses. 1+ edema 1/2 up lower legs bilaterally.  Abdomen:  Bowel sounds positive; abdomen soft and non-tender without masses, organomegaly, or hernias noted. No hepatosplenomegaly. Extremities:  No clubbing or cyanosis. Neurologic:  Alert and oriented x 3. Skin:  Intact without lesions or rashes. Psych:  Normal affect.   Impression & Recommendations:  Problem # 1:  DYSPNEA (ICD-786.05) Patient has exertional dyspnea with minimal activity.  She is not very active at all due to a combination of pain and weakness from MS.  She cannot exert herself to 4 METS without severe dyspnea.  No chest pain.  She has multiple risk factors for coronary disease: smoking, diabetes, HTN, hyperlipidemia.  The dyspnea may all be due to obesity and deconditioning, but given the upcoming orthopedic surgery, I think that she needs risk stratification with a Lexiscan myoview.  I will also get an echo to assess for LV systolic and diastolic function and pulmonary HTN given history of untreated OSA.    Problem # 2:  TACHYCARDIA (ICD-785.0) Sinus tachycardia, mild.  I will get a 48 hour holter to assess for any other arrhythmias and to determine average heart rate.  As she is also  hypertensive, I will go ahead and increase Lopressor to 50 mg two times a day.   Problem # 3:  SMOKER (ICD-305.1) Patient needs to quit this before surgery. She will try nicotine patches.   Other Orders: Echocardiogram (Echo) Nuclear Stress Test (Nuc Stress Test) Holter Monitor (Holter Monitor)  Patient Instructions: 1)  Your physician has recommended you make the following change in your medication:  2)  Increase Metoprolol to 50mg  twice a day. 3)  Use a nicotine patch 14mg  daily to help you stop smoking. You should stop smoking before your surgery 06/30/10. 4)  Your physician has requested that you have an echocardiogram.  Echocardiography is a painless test that uses sound waves to create images of your heart. It provides your doctor with information about the size  and shape of your heart and how well your heart's chambers and valves are working.  This procedure takes approximately one hour. There are no restrictions for this procedure. 5)  Your physician has requested that you have an lexiscan myoview.  For further information please visit [email or web address]  Please follow instruction sheet, as given. 6)  Your physician recommends that you schedule a follow-up appointment with Dr Shirlee Latch after the testing has been completed and before your surgery 06/30/10. 7)  Your physician has recommended that you wear a holter monitor.  Holter monitors are medical devices that record the heart's electrical activity. Doctors most often use these monitors to diagnose arrhythmias. Arrhythmias are problems with the speed or rhythm of the heartbeat. The monitor is a small, portable device. You can wear one while you do your normal daily activities. This is usually used to diagnose what is causing palpitations/syncope (passing out). 48 hour monitor Prescriptions: RA NICOTINE 14 MG/24HR PT24 (NICOTINE) use daily as directed to help you stop smoking  #30 x 2   Entered by:   Katina Dung, RN, BSN   Authorized by:    Marca Ancona, MD   Signed by:   Katina Dung, RN, BSN on 06/06/2010   Method used:   [facility] [phone]* (retail)       [address]       [place], [place]  [id number]       Ph: [phone] or [phone]       Fax: [phone]   RxID:   [id number] METOPROLOL TARTRATE 50 MG TABS (METOPROLOL TARTRATE) one every 12 hours  #60 x 6   Entered by:   Katina Dung, RN, BSN   Authorized by:   Marca Ancona, MD   Signed by:   Katina Dung, RN, BSN on 06/06/2010   Method used:   [facility]        [facility] [phone]* (retail)       [address]       [place], [place]  [id number]       Ph: [phone] or [phone]       Fax: [phone]   RxID:   [id number]

## 2010-06-20 ENCOUNTER — Ambulatory Visit (INDEPENDENT_AMBULATORY_CARE_PROVIDER_SITE_OTHER): Payer: Medicare Other | Admitting: Physician Assistant

## 2010-06-20 ENCOUNTER — Encounter: Payer: Self-pay | Admitting: Physician Assistant

## 2010-06-20 DIAGNOSIS — I10 Essential (primary) hypertension: Secondary | ICD-10-CM

## 2010-06-20 DIAGNOSIS — R0602 Shortness of breath: Secondary | ICD-10-CM

## 2010-06-20 DIAGNOSIS — R000 Tachycardia, unspecified: Secondary | ICD-10-CM

## 2010-06-20 DIAGNOSIS — Z01810 Encounter for preprocedural cardiovascular examination: Secondary | ICD-10-CM

## 2010-06-21 ENCOUNTER — Other Ambulatory Visit: Payer: Self-pay | Admitting: *Deleted

## 2010-06-21 NOTE — Telephone Encounter (Signed)
THIS PERSON IS NO LONGER A PT IN [facility], SHE WITHDREW AS A PT 02/2010

## 2010-06-24 ENCOUNTER — Telehealth (INDEPENDENT_AMBULATORY_CARE_PROVIDER_SITE_OTHER): Payer: Self-pay | Admitting: *Deleted

## 2010-06-24 ENCOUNTER — Ambulatory Visit: Payer: Self-pay | Admitting: Cardiology

## 2010-06-25 ENCOUNTER — Encounter: Payer: Self-pay | Admitting: Cardiology

## 2010-06-25 NOTE — Letter (Signed)
Summary: Medicaid Transportation Verification  Medicaid Transportation Verification   Imported By: Lenard Forth 06/18/2010 11:28:02  _____________________________________________________________________  External Attachment:    Type:   Image     Comment:   External Document

## 2010-06-25 NOTE — Progress Notes (Signed)
Summary: pre-op notes fax over from [facility]  Phone Note From Other Clinic   Caller: alicia warrick pa - office [phone]- urgent family .  [phone] here til 2 p.m. Request: Talk with Nurse Summary of Call: status of pre-op clearance. pt went to er on 1/25 at [facility]. will faxed records over.  Initial call taken by: Lorne Skeens,  June 18, 2010 10:38 AM  Follow-up for Phone Call        Dr Shirlee Latch reviewed  CXR  ER visit  records faxed from [facility]-C--Dr Del Muerto states OK -no change from current plan for surgical clearance--pt has an appt with Tereso Newcomer 06/20/10 for review of recent test results and surgical clearance recommendations--I have given these records to Danielle Rankin so they will be available at the time of the visit with Tereso Newcomer 06/20/10- I have discussed with Kennedy Bucker

## 2010-06-25 NOTE — Assessment & Plan Note (Signed)
Summary: f/u echo/myoview/holter   Vital Signs:  Patient profile:   50 year old female Height:      62.5 inches Weight:      182.50 pounds BMI:     32.97 Pulse rate:   96 / minute Resp:     18 per minute BP sitting:   152 / 100  (left arm) Cuff size:   large  Vitals Entered By: Celestia Khat, CMA (June 20, 2010 8:52 AM)  Visit Type:  Follow-up Referring Provider:  Dr. Hermelinda Dellen Primary Provider:  Jeralyn Ruths  CC:  sob. maybe a little tightness in her chest.  History of Present Illness: Primary Cardiologist:  Dr. Marca Ancona  [name] is a 50 yo with history of MS, obesity, HTN, diabetes, chronic dyspnea  who recently presented for cardiology evaluation prior to left total knee replacement.  She uses a motorized scooter when out of the house because of left knee pain, leg weakness from MS, and foot pain from her diabetic neuropathy.  She reported a h/o dyspnea with household activities and palpitations with noted sinus tachycardia.  She was set up for an echo that demonstrated an EF 65-70%; severe LVH; PASP 34; mild LAE and normal diast. fxn.  She also had a myoview that demonstrated an EF 71%, no scar or ischemia.  A holter was placed and demonstrated NSR, sinus tachy and PVCs but no significant arrhythmias.  She presented to the ED recently with chest pain.  CEs were neg. x 2.  She was dx with bronchitis and treated with inhalers and prednisone.  A CXR demonstrated cardiomegaly and pulmonary vascular congestion.  She presents for follow up prior to her surgery.  She has noted some improvement in her breathing since last seen.  She thinks the tx for her bronchitis has helped.  She denies orthopnea or PND.  No syncope or chest pain.  She has some lower ext edema that is unchanged and she takes lasix daily.  Current Medications (verified): 1)  Tysabri 300 Mg/10ml Conc (Natalizumab) .Marland Kitchen.. 1 Inj Every 4 Wks 2)  Flexeril 10 Mg  Tabs (Cyclobenzaprine Hcl) .Marland Kitchen.. 1 Every 12 Hrs 3)   Losartan Potassium 50 Mg Tabs (Losartan Potassium) .... Take 1 Tablet By Mouth Once A Day 4)  Furosemide 40 Mg Tabs (Furosemide) .Marland Kitchen.. 1 Once Daily 5)  Nexium 40 Mg Cpdr (Esomeprazole Magnesium) .... Take 1 Tablet By Mouth Once A Day 6)  Baclofen 10 Mg Tabs (Baclofen) .... Take 1 Tablet By Mouth Twice A Day 7)  Roxicodone 15 Mg Tabs (Oxycodone Hcl) .Marland Kitchen.. 1 2 To 3 Times Per Day 8)  Hydroxyzine Hcl 25 Mg Tabs (Hydroxyzine Hcl) .... Take 1 Tablet By Mouth Three Times Daily.  If Needed, A Fourth Pill Can Be Taken.  This Medicine Is For Itching. 9)  Opana Er 40 Mg Xr12h-Tab (Oxymorphone Hcl) .... Take 1 Tablet Every 12 Hours 10)  Glucophage 1000 Mg Tabs (Metformin Hcl) .... Take 1 Tablet Twice A Day. 11)  Metoprolol Tartrate 50 Mg Tabs (Metoprolol Tartrate) .... One Every 12 Hours 12)  Topamax 100 Mg Tabs (Topiramate) .... Take 1 Tablet At Bedtime 13)  Trileptal 150 Mg Tabs (Oxcarbazepine) .... Take 1 Tablet Twice A Day. 14)  Imipramine Hcl 50 Mg Tabs (Imipramine Hcl) .... 2 Every 12 Hrs 15)  Novolog Flexpen 100 Unit/ml Soln (Insulin Aspart) .... Inject Subc Depending Blood Sugar Level. Blood Sugar 150 To 200 Inject 2 Units. 200 To 300 Use 4 Units. 300 To  350 Use 7 Units. 16)  Lantus Solostar 100 Unit/ml Soln (Insulin Glargine) .... Inject 17 Units Subcutaneously Every Night. 17)  Crestor 10 Mg Tabs (Rosuvastatin Calcium) .Marland Kitchen.. 1 At Bedtime 18)  Terazosin Hcl 1 Mg Caps (Terazosin Hcl) .... 2 At Bedtime 19)  Diclofenac Potassium 50 Mg Tabs (Diclofenac Potassium) .Marland Kitchen.. 1 Every 12 Hrs 20)  Venlafaxine Hcl 75 Mg Xr24h-Cap (Venlafaxine Hcl) .Marland Kitchen.. 1 Every 12 Hrs 21)  Ra Nicotine 14 Mg/24hr Pt24 (Nicotine) .... Use Daily As Directed To Help You Stop Smoking 22)  Align 4 Mg Caps (Probiotic Product) .... Once Daily 23)  Vitamin B-12 50 Mcg Tabs (Cyanocobalamin) .... Once Daily 24)  Vitamin B-6 50 Mg Tabs (Pyridoxine Hcl) .... Once Daily 25)  Nasonex 50 Mcg/act Susp (Mometasone Furoate) .... 2 Squirts in Each  Nostril Once Daily 26)  Mucinex 600 Mg Xr12h-Tab (Guaifenesin) .... Two Times A Day  Allergies (verified): 1)  ! Morphine 2)  ! * Codine  Past History:  Past Medical History: 1. Hypertension 2. Overflow Urinary incontinence 3. Multiple Sclerosis: diagnosed in 2008 4. Paronychia with MRSA. 5. Diabetes mellitus, type II with neuropathy 6. Chronic bronchitis, clinical dx: Active smoker     - See consult June 02, 2010 (Dr. Sherene Sires) 7. Morbid obesity 8. Hyperlipidemia 9. Restless leg syndrome 10. Chronic pain 11. Degenerative disc disease 12. OSA: Not using CPAP (did not tolerate) 13. Chronic dyspnea 14. Sinus tachycardia 15. OA left knee 16. Active smoker 17. Echo 06/16/10: EF 65-70%; severe LVH; PASP 34; mild LAE; normal diast. fxn 18. Myoview (06/16/10): EF 71%; no scar or ischemia 19. Holter 05/2010: NSR, sinus tachy, PVCs; no sig. arrhythmia  Review of Systems       As per  the HPI.  All other systems reviewed and negative.   Physical Exam  General:  Well nourished, well developed, in no acute distress HEENT: normal Neck: no JVD Cardiac:  normal S1, S2; RRR; no murmur Lungs:  clear to auscultation bilaterally, no wheezing, rhonchi or rales Abd: soft, nontender, no hepatomegaly Ext: trace bilat edema, L>R Skin: warm and dry Neuro:  CNs 2-12 intact, no focal abnormalities noted    Impression & Recommendations:  Problem # 1:  HYPERTENSION (ICD-401.9) She needs better control of her BP in preparation for her surgery.  I am going to increase her metoprolol to 75 mg two times a day and her Losartan to 100 mg once daily.  Problem # 2:  DYSPNEA (ICD-786.05) Echo with normal LVF and Myoview without ischemia.  Her dyspnea is improved with tx of her bronchitis.  Her dyspnea is multifactorial and mostly made worse by her COPD and recent bronchitis.  With her severe LVH, she also likely has diastolic CHF.  Her volume appears stable at this time.  No further cardiac workup at  this time.  See discussion below.  Problem # 3:  PRE-OPERATIVE CARDIOVASCULAR EXAMINATION (ICD-V72.81) She is probably at moderate risk for cardiac complications during her surgery due to her comorbidities.  Close attention should be paid to controlling her BP and watching her volume status in the perioperative period.  She should continue her beta blocker throughout the perioperative period.  Our service will certainly be available as necessary.  Problem # 4:  SMOKER (ICD-305.1) She states she has quit smoking.  Problem # 5:  TACHYCARDIA (ICD-785.0) Likely related to a  combination of COPD, obesity, pain and HTN.  As above, her beta blocker will be increased.  As noted, she had no  significant arrhythmias on her holter.  Patient Instructions: 1)  Your physician recommends that you schedule a follow-up appointment in: to see dr Shirlee Latch as necessary 2)  Your physician has recommended you make the following change in your medication: increase your metoprolol to 75mg  everyday(1 and 1/2 tablets) and increase losartan to 100mg  everyday 3)  Your physician recommends that you return for lab work [id number] return in 1 week for BMET  Appended Document: f/u echo/myoview/holter Rx for metoprolol was entered incorrectly.  I called pharmacy and corrected it to 50 mg 1 and 1/2 tabs two times a day; #90, 10 refills. I called the patient and made sure she understood the directions as these were also typed out incorrectly. Tereso Newcomer PA-C  June 20, 2010 1:35 PM    Clinical Lists Changes  Medications: Changed medication from METOPROLOL TARTRATE 50 MG TABS (METOPROLOL TARTRATE) TAKE 1 AND 1/2 TABS QD to METOPROLOL TARTRATE 50 MG TABS (METOPROLOL TARTRATE) TAKE 1 AND 1/2 TABS two times a day

## 2010-06-25 NOTE — Assessment & Plan Note (Signed)
Summary: Cardiology Nuclear Testing  Nuclear Med Background Indications for Stress Test: Evaluation for Ischemia, Surgical Clearance, Post Hospital  Indications Comments: 06/11/10 chest pain, negative enzymes. Pending (L) knee surgery.   History: Asthma, Echo  History Comments: '09 Echo:EF=60-65%  Symptoms: Chest Pain, Chest Tightness, DOE, Palpitations, SOB    Nuclear Pre-Procedure Cardiac Risk Factors: Hypertension, IDDM Type 2, Lipids, Smoker Caffeine/Decaff Intake: none NPO After: 11:00 PM Lungs: clear IV 0.9% NS with Angio Cath: 22g     IV Site: R Forearm IV Started by: Cathlyn Parsons, RN Chest Size (in) 46     Cup Size D     Height (in): 62.5 Weight (lb): 288 BMI: 52.02 Tech Comments: Metoprolol held x12hrs. Patient BS at 0630 was 89;no Metformin.  Nuclear Med Study 1 or 2 day study:  2 day     Stress Test Type:  Lexiscan Reading MD:  Cassell Clement, MD     Referring MD:  D.Aeon Koors Resting Radionuclide:  Technetium 64m Tetrofosmin     Resting Radionuclide Dose:  33 mCi  Stress Radionuclide:  Technetium 39m Tetrofosmin     Stress Radionuclide Dose:  33 mCi   Stress Protocol  Max Systolic BP: 137 mm Hg Lexiscan: 0.4 mg   Stress Test Technologist:  Milana Na, EMT-P     Nuclear Technologist:  Doyne Keel, CNMT  Rest Procedure  Myocardial perfusion imaging was performed at rest 45 minutes following the intravenous administration of Technetium 49m Tetrofosmin.  Stress Procedure  The patient received IV Lexiscan 0.4 mg over 15-seconds.  Technetium 55m Tetrofosmin injected at 30-seconds.  There were no significant changes with infusion.  Quantitative spect images were obtained after a 45 minute delay.  QPS Raw Data Images:  Marked breast shadowing.  Stress Images:  Normal homogeneous uptake in all areas of the myocardium. Rest Images:  Normal homogeneous uptake in all areas of the myocardium. Subtraction (SDS):  There is no evidence of scar or  ischemia. Transient Ischemic Dilatation:  0.86  (Normal <1.22)  Lung/Heart Ratio:  0.27  (Normal <0.45)  Quantitative Gated Spect Images QGS EDV:  61 ml QGS ESV:  18 ml QGS EF:  71 % QGS cine images:  Normal wall motion.    Overall Impression  Exercise Capacity: Lexiscan with no exercise. BP Response: Normal blood pressure response. Clinical Symptoms: Short of breath.  ECG Impression: No significant ST segment change suggestive of ischemia. Overall Impression: Normal stress nuclear study.  Appended Document: Cardiology Nuclear Testing probably normal study  Appended Document: Cardiology Nuclear Testing appt 06/20/10 with Tereso Newcomer

## 2010-06-27 ENCOUNTER — Other Ambulatory Visit: Payer: Self-pay | Admitting: Cardiology

## 2010-06-27 ENCOUNTER — Other Ambulatory Visit (INDEPENDENT_AMBULATORY_CARE_PROVIDER_SITE_OTHER): Payer: Medicare Other

## 2010-06-27 ENCOUNTER — Encounter: Payer: Self-pay | Admitting: Cardiology

## 2010-06-27 DIAGNOSIS — R0602 Shortness of breath: Secondary | ICD-10-CM

## 2010-06-27 DIAGNOSIS — I10 Essential (primary) hypertension: Secondary | ICD-10-CM

## 2010-06-27 LAB — BASIC METABOLIC PANEL
GFR: 101.85 mL/min (ref >60.00)
Potassium: 3.5 mEq/L (ref 3.5–5.1)
Sodium: 141 mEq/L (ref 135–145)

## 2010-06-30 ENCOUNTER — Inpatient Hospital Stay (HOSPITAL_COMMUNITY): Payer: Medicare Other

## 2010-06-30 ENCOUNTER — Inpatient Hospital Stay (HOSPITAL_COMMUNITY)
Admission: RE | Admit: 2010-06-30 | Discharge: 2010-07-03 | DRG: 470 | Disposition: A | Discharge status: Skilled Nursing Facility | Payer: Medicare Other | Attending: Orthopedic Surgery | Admitting: Orthopedic Surgery

## 2010-06-30 DIAGNOSIS — T400X1A Poisoning by opium, accidental (unintentional), initial encounter: Secondary | ICD-10-CM | POA: Diagnosis present

## 2010-06-30 DIAGNOSIS — R32 Unspecified urinary incontinence: Secondary | ICD-10-CM | POA: Diagnosis present

## 2010-06-30 DIAGNOSIS — M1710 Unilateral primary osteoarthritis, unspecified knee: Principal | ICD-10-CM | POA: Diagnosis present

## 2010-06-30 DIAGNOSIS — T40601A Poisoning by unspecified narcotics, accidental (unintentional), initial encounter: Secondary | ICD-10-CM | POA: Diagnosis present

## 2010-06-30 DIAGNOSIS — E119 Type 2 diabetes mellitus without complications: Secondary | ICD-10-CM | POA: Diagnosis present

## 2010-06-30 DIAGNOSIS — G35 Multiple sclerosis: Secondary | ICD-10-CM | POA: Diagnosis present

## 2010-06-30 DIAGNOSIS — E669 Obesity, unspecified: Secondary | ICD-10-CM | POA: Diagnosis present

## 2010-06-30 DIAGNOSIS — Z01818 Encounter for other preprocedural examination: Secondary | ICD-10-CM

## 2010-06-30 DIAGNOSIS — Z79899 Other long term (current) drug therapy: Secondary | ICD-10-CM

## 2010-06-30 DIAGNOSIS — Z794 Long term (current) use of insulin: Secondary | ICD-10-CM

## 2010-06-30 DIAGNOSIS — Z96659 Presence of unspecified artificial knee joint: Secondary | ICD-10-CM

## 2010-06-30 DIAGNOSIS — Y9210 Unspecified residential institution as the place of occurrence of the external cause: Secondary | ICD-10-CM | POA: Diagnosis present

## 2010-06-30 DIAGNOSIS — R4182 Altered mental status, unspecified: Secondary | ICD-10-CM | POA: Diagnosis present

## 2010-06-30 DIAGNOSIS — K219 Gastro-esophageal reflux disease without esophagitis: Secondary | ICD-10-CM | POA: Diagnosis present

## 2010-06-30 DIAGNOSIS — F17200 Nicotine dependence, unspecified, uncomplicated: Secondary | ICD-10-CM | POA: Diagnosis present

## 2010-06-30 DIAGNOSIS — I10 Essential (primary) hypertension: Secondary | ICD-10-CM | POA: Diagnosis present

## 2010-06-30 LAB — GLUCOSE, CAPILLARY
Glucose-Capillary: 99 mg/dL (ref 70–99)
Glucose-Capillary: 148 mg/dL — ABNORMAL HIGH (ref 70–99)

## 2010-06-30 LAB — TYPE AND SCREEN: Antibody Screen: NEGATIVE

## 2010-07-01 LAB — HEMOGLOBIN A1C
Hgb A1c MFr Bld: 5.5 % (ref <5.7)
Mean Plasma Glucose: 111 mg/dL (ref <117)

## 2010-07-01 LAB — CBC
Hemoglobin: 11.4 g/dL — ABNORMAL LOW (ref 12.0–15.0)
MCH: 28.7 pg (ref 26.0–34.0)
MCV: 84.4 fL (ref 78.0–100.0)
RBC: 3.97 MIL/uL (ref 3.87–5.11)

## 2010-07-01 LAB — GLUCOSE, CAPILLARY: Glucose-Capillary: 114 mg/dL — ABNORMAL HIGH (ref 70–99)

## 2010-07-01 LAB — BASIC METABOLIC PANEL
CO2: 27 mEq/L (ref 19–32)
Chloride: 108 mEq/L (ref 96–112)
Creatinine, Ser: 0.92 mg/dL (ref 0.4–1.2)
GFR calc Af Amer: >60 mL/min (ref >60)
Potassium: 3.7 mEq/L (ref 3.5–5.1)

## 2010-07-02 LAB — CBC
MCH: 28.9 pg (ref 26.0–34.0)
MCHC: 34.2 g/dL (ref 30.0–36.0)
MCV: 84.4 fL (ref 78.0–100.0)
Platelets: 218 10*3/uL (ref 150–400)
RDW: 13.3 % (ref 11.5–15.5)
WBC: 12.3 10*3/uL — ABNORMAL HIGH (ref 4.0–10.5)

## 2010-07-02 LAB — BASIC METABOLIC PANEL
BUN: 4 mg/dL — ABNORMAL LOW (ref 6–23)
Calcium: 9 mg/dL (ref 8.4–10.5)
Creatinine, Ser: 0.79 mg/dL (ref 0.4–1.2)
GFR calc non Af Amer: >60 mL/min (ref >60)
Glucose, Bld: 120 mg/dL — ABNORMAL HIGH (ref 70–99)
Sodium: 140 mEq/L (ref 135–145)

## 2010-07-02 LAB — GLUCOSE, CAPILLARY
Glucose-Capillary: 108 mg/dL — ABNORMAL HIGH (ref 70–99)
Glucose-Capillary: 102 mg/dL — ABNORMAL HIGH (ref 70–99)

## 2010-07-03 ENCOUNTER — Other Ambulatory Visit: Payer: Self-pay | Admitting: Internal Medicine

## 2010-07-03 ENCOUNTER — Emergency Department (HOSPITAL_COMMUNITY)
Admission: EM | Admit: 2010-07-03 | Discharge: 2010-07-03 | Disposition: A | Discharge status: Skilled Nursing Facility | Payer: Medicare Other | Source: Home / Self Care | Attending: Emergency Medicine | Admitting: Emergency Medicine

## 2010-07-03 DIAGNOSIS — R4182 Altered mental status, unspecified: Secondary | ICD-10-CM | POA: Insufficient documentation

## 2010-07-03 DIAGNOSIS — Z79899 Other long term (current) drug therapy: Secondary | ICD-10-CM | POA: Insufficient documentation

## 2010-07-03 DIAGNOSIS — Z794 Long term (current) use of insulin: Secondary | ICD-10-CM | POA: Insufficient documentation

## 2010-07-03 DIAGNOSIS — Z96659 Presence of unspecified artificial knee joint: Secondary | ICD-10-CM | POA: Insufficient documentation

## 2010-07-03 DIAGNOSIS — I10 Essential (primary) hypertension: Secondary | ICD-10-CM | POA: Insufficient documentation

## 2010-07-03 DIAGNOSIS — G35 Multiple sclerosis: Secondary | ICD-10-CM | POA: Insufficient documentation

## 2010-07-03 DIAGNOSIS — E119 Type 2 diabetes mellitus without complications: Secondary | ICD-10-CM | POA: Insufficient documentation

## 2010-07-03 DIAGNOSIS — Y9210 Unspecified residential institution as the place of occurrence of the external cause: Secondary | ICD-10-CM | POA: Insufficient documentation

## 2010-07-03 DIAGNOSIS — T400X1A Poisoning by opium, accidental (unintentional), initial encounter: Secondary | ICD-10-CM | POA: Insufficient documentation

## 2010-07-03 DIAGNOSIS — T40601A Poisoning by unspecified narcotics, accidental (unintentional), initial encounter: Secondary | ICD-10-CM | POA: Insufficient documentation

## 2010-07-03 LAB — POCT I-STAT, CHEM 8
Creatinine, Ser: 1.6 mg/dL — ABNORMAL HIGH (ref 0.4–1.2)
Glucose, Bld: 114 mg/dL — ABNORMAL HIGH (ref 70–99)
Hemoglobin: 12.2 g/dL (ref 12.0–15.0)
TCO2: 25 mmol/L (ref 0–100)

## 2010-07-03 NOTE — Progress Notes (Signed)
   LOV faxed to [facility] @ [phone] [facility]  June 24, 2010 8:40 AM

## 2010-07-03 NOTE — Procedures (Signed)
Summary: summary report  summary report   Imported By: Mirna Mires 06/25/2010 10:32:11  _____________________________________________________________________  External Attachment:    Type:   Image     Comment:   External Document

## 2010-07-07 NOTE — Op Note (Signed)
[name], [name]                 ACCOUNT NO.:  [id number]  MEDICAL RECORD NO.:  [id number]           PATIENT TYPE:  I  LOCATION:  0007                         FACILITY:  [facility]  PHYSICIAN:  [name]. [name], M.D.  DATE OF BIRTH:  October 19, 1960  DATE OF PROCEDURE:  06/30/2010 DATE OF DISCHARGE:                              OPERATIVE REPORT   PREOPERATIVE DIAGNOSIS:  Left knee osteoarthritis.  POSTOPERATIVE DIAGNOSIS:  Left knee osteoarthritis.  PROCEDURE:  Left total-knee replacement.  COMPONENTS USED:  DePuy rotating platform posterior stabilized knee system, size 3 femur, 2.5 tibia, 10-mm insert to match the 3 femur and a 38 patellar button.  SURGEON:  [name]. [name], M.D.  ASSISTANT:  Nelia Shi. Webb Silversmith, RN  ANESTHESIA:  Preoperative regional femoral nerve block plus general anesthesia.  SPECIMENS:  None.  COMPLICATIONS:  None.  DRAINS:  One Hemovac.  TOURNIQUET TIME:  38 minutes at 250 mmHg.  ESTIMATED BLOOD LOSS:  Minimal.  INDICATIONS OF THE PROCEDURE:  [name] is a 50 year old female with advanced left knee osteoarthritis.  She had failed conservative measures.  The risks of surgery were discussed with her, particularly focusing on her medical conditions due to her obesity, diabetes and hypertension.  After reviewing these risks, she wished to proceed with this for the benefit of pain relief.  The risk of infection, DVT, component failure, problems with stiffness related to her diabetes and obesity were all reviewed and discussed at length.  Consent was obtained for the benefit of pain relief.  PROCEDURE IN DETAIL:  The patient was brought to the operative theater. Once adequate anesthesia and preoperative antibiotics were administered, 2 grams of Ancef, the patient was positioned supine with a left thigh tourniquet placed.  The left lower extremity was then prepped and draped in the sterile fashion.  A time-out was performed identifying the patient, planned  procedure and extremity.  Her left lower extremity was placed into the [facility] leg holder.  The leg was exsanguinated, tourniquet elevated to 250 mmHg.  A midline incision was made followed by a median arthrotomy following initial exposure and debridement.  Attention was first directed to the patella. Precut measurement was 21 mm.  I resected down to about 13 mm and used a 38 patellar button to cover the cut surface, but also to restore patellar height.  Lug holes were drilled and a metal shim was placed to protect the patella from retractors and saw blades.  Attention was now directed to the femur.  The femoral canal was opened with a drill and irrigated to try to prevent fat emboli.  An intramedullary rod was passed and at 3 degrees of valgus 10 mm of bone was resected off the distal femur.  Her tibia was then subluxated anteriorly and using the extramedullary guide, I resected 10 mm of bone off the proximal lateral tibia.  Following this resection, I confirmed that this extension gap was going to be stable with medial and lateral collateral ligaments with the 10-mm spacer and then confirmed that the cut was perpendicular in the coronal plane using the tibial tray with an alignment rod.  Once this was done, I sized the femur to be a size 3 and the size 3 rotation block was then pinned into position anterior reference using the C-clamp off the proximal tibia cut, which was confirmed to be perpendicular in the coronal plane.  The 4-in-1 cutting block was then pinned into position, confirmed that there was going to be no notching and then the anterior, posterior and chamfer cuts were then made without difficulty nor notching.  Final box cut was made off the lateral aspect of the distal femur for the size 3 femur.  At this point, the tibia was subluxated anteriorly and the size 2.5 tibial tray fit best and aligned through the medial third of the tubercle.  I then drilled and keel punched  this into place and did a trial reduction with a 3 femur, 2.5 tibia and a 10-mm insert.  The knee came to full extension.  The knee ligaments were stable and palpable.  The patella tracked through the trochlea without application of pressure.  At this point, the final components were opened.  Trial components were removed.  The knee was injected in the synovial capsule junction with 0.25% Marcaine with epinephrine and 1 cc of Toradol for a total of 61 cc.  The knee was irrigated with normal saline solution and the cement mixed.  The final components were then cemented into position on clean and dried cut surfaces of bone.  The knee was brought to extension with the 10-mm insert and extruded cement was removed.  Once the cement had fully cured, excessive cement was removed throughout the knee and following irrigation the final 10-mm insert to match the 3 femur was placed.  The tourniquet had been let down after 38 minutes.  There was no significant hemostasis required.  The medium Hemovac drain was placed deep.  The extensor mechanism was then reapproximated using #1 Vicryl with the knee in flexion.  The remaining of the wound was closed with 2- 0 Vicryl and running 4-0 Monocryl.  The knee was cleaned, dried and dressed sterilely with an Octylseal sealant and Aquacel dressing and then the drain site was dressed separately.  She was then brought to the recovery room, extubated in stable condition, tolerating the procedure well.     [name] [name], M.D.     MDO/MEDQ  D:  06/30/2010  T:  06/30/2010  Job:  [id number]  Electronically Signed by Durene Romans M.D. on 07/07/2010 10:50:09 AM

## 2010-07-07 NOTE — H&P (Signed)
[name], [name]                 ACCOUNT NO.:  [id number]  MEDICAL RECORD NO.:  [id number]           PATIENT TYPE:  LOCATION:                                 FACILITY:  PHYSICIAN:  [name]. [name], M.D.  DATE OF BIRTH:  23-Dec-1960  DATE OF ADMISSION: DATE OF DISCHARGE:                             HISTORY & PHYSICAL   ADMISSION DIAGNOSIS:  Left knee osteoarthritis.  HISTORY:  This patient was referred to our clinic through Dr. Delfin Gant office for left knee pain.  She is a chronic pain patient with Physical Medicine Rehabilitation with current system.  The patient is failing conservative treatment and she has decided to proceed with the left knee arthroplasty as soon as possible, so that she can move back to [place] in May and be more independent.  PAST MEDICAL HISTORY:  Significant for impaired vision.  She does wear glasses.  She has got significant pulmonary history with sleep apnea, asthma and history of bronchitis.  She has hypertension, reflux disease, diabetes, and she has some urinary incontinence with new diagnosis of MS.  She has fatigue, joint swelling, muscular pain, back pain, spasms, stiffness, and muscle weakness.  She has some insomnia occasionally and dermatologically, she has itching and eczema.  CURRENT MEDICATION LIST: 1. Tysabri 300 mg daily. 2. Flexeril 10 mg a day. 3. Losartan 50 mg. 4. Lasix 40 mg a day. 5. Nexium 40 mg a day. 6. Baclofen 10 mg a day. 7. Roxicodone 15 mg as needed. 8. Hydroxyzine 25 mg as needed. 9. Opana ER 40 mg every 12 hours. 10.Glucophage 1000 mg twice daily. 11.Topamax 100 mg at bedtime. 12.Trileptal 150 mg twice a day. 13.__________ 50 mg every 12 hours. 14.NovoLog FlexPen 100 units with sugar levels. 15.Lantus 100 mg every night. 16.Crestor 10 mg a day. 17.Terazosin 1 mg 2 at bedtime. 18.Baclofen 50 mg daily. 19.Venlafaxine 75 mg every day.  ALLERGIES:  She has no medicine allergies.  SURGICAL HISTORY:  She  did not list surgical history.  SOCIAL HISTORY:  The patient is separated.  She has some college education.  She is on disability with MS.  She has past history of tobacco use.  She quit in January.  No history of alcohol or drug abuse. She has 2 children.  FAMILY HISTORY:  Her father had a stroke and died at 65, her mother died at 75 of diabetes.  REVIEW OF SYSTEMS:  Notable for those difficulties described in the present illness, past medical history, and her review of systems.  She is otherwise unremarkable.  PHYSICAL EXAMINATION:  VITAL SIGNS:  The patient is 288 pounds, her height is not given.  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  Blood pressure is 145/90, her respirations 20, pulse is 88. GENERAL:  Her general health is poor with diabetes. HEENT:  Shows her to be normocephalic with glasses and diminished vision. NECK:  Unremarkable. CHEST:  She is clear to auscultation bilaterally.  She does have a history of sleep apnea, asthma, and bronchitis.  She gets shortness of breath with exertion. HEART:  S1 and S2.  There are no murmurs, rubs, or gallops. ABDOMEN:  Pendulous.  She does have GERD, diarrhea, and some changes in her appetite. GU:  Does show some urinary incontinence. EXTREMITIES:  Shows osteoarthritis, generalized joint pain, weakness. DERMATOLOGIC:  She has occasional eczema. NEUROLOGICAL:  She is intact.  Her labs, EKG, and chest x-ray are pending through [facility].  IMPRESSION:  Left knee osteoarthritis.  PLAN:  She will be admitted on 13th for total knee arthroplasty.  DISCHARGE MEDICATIONS:  Xarelto, Robaxin, MiraLax, Colace, and iron be given to her today.  Her pain medicines will continue through the pain clinic.     Russell L. Webb Silversmith, RN   ______________________________ [name] [name], M.D.    RLW/MEDQ  D:  06/23/2010  T:  06/23/2010  Job:  [id number]  Electronically Signed by Lauree Chandler NP-C on 06/30/2010 02:55:17  PM Electronically Signed by Durene Romans M.D. on 07/07/2010 10:50:03 AM

## 2010-07-09 NOTE — Procedures (Signed)
Summary: Summary Report  Summary Report   Imported By: Erle Crocker 06/30/2010 09:50:31  _____________________________________________________________________  External Attachment:    Type:   Image     Comment:   External Document

## 2010-07-11 NOTE — Discharge Summary (Signed)
[name], [name]                 ACCOUNT NO.:  [id number]  MEDICAL RECORD NO.:  [id number]           PATIENT TYPE:  I  LOCATION:  1603                         FACILITY:  [facility]  PHYSICIAN:  [name]. [name], M.D.  DATE OF BIRTH:  05-23-1960  DATE OF ADMISSION:  06/30/2010 DATE OF DISCHARGE:                              DISCHARGE SUMMARY   ADMISSION DIAGNOSIS:  Left knee osteoarthritis.  BRIEF HISTORY:  This is a patient who was seen in our office in evaluation for left knee pain that had worsened.  She had failed conservative treatment with her primary care as well as in our office and decided to proceed with left knee arthroplasty.  HOSPITAL COURSE:  The patient was admitted on May 30, 2010, through same-day surgery; was taken to operating theater and underwent left knee arthroplasty with Dr. [name] without any difficulty.  She was taken to PACU for recovery and brought to [facility] for further recovery and rehabilitation.  Since that time, she has advanced diet to regular.  She has done well with physical therapy.  Her labs have been stable.  She is afebrile.  Her vital signs are stable.  Her discharge condition is good. Today, her hemoglobin/hematocrit was 11.7/34.2, potassium was 3.6, sodium 140.  Her BUN and creatinine are 4 and 0.79.  Her blood sugar is 120.  Her wound is clean and dry.  Discharge instructions were given. She knows to keep the Aquacel dressing in place for 8 days.  She will do therapy at her facility.  She is going to be transferred to SNF for further rehabilitation.  She will follow up with Dr. [name] in 2 weeks.  DISCHARGE DIAGNOSES: 1. Left knee osteoarthritis. 2. Diabetes. 3. Hypertension. 4. Sleep apnea. 5. History of asthma. 6. She has decreased appetite with some diarrhea. 7. Gastroesophageal reflux disease. 8. Urinary tract incontinence. 9. Joint pain eczema from time to time. 10.She does wear glasses.  DISCHARGE MEDICATIONS:  As follows: 1.  Acetaminophen 325 mg 2 every 6 hours as needed. 2. Colace 100 mg daily. 3. Ferrous sulfate 325 mg 3 times a day. 4. Morphine 100 mg SR twice daily. 5. Oxycodone 5 mg IR every 4 hours as per pain clinic. 6. MiraLax 17 g a day. 7. Xarelto 10 mg a day for 10 days. 8. She does take Alka-Seltzer. 9. Baclofen 10 mg twice daily. 10.Crestor 10 mg a day. 11.Diclofenac 50 mg daily. 12.Flexeril up to 3 times a day 10 mg as needed. 13.Furosemide 40 mg twice daily. 14.Hydroxyzine 25 mg up to 3 times a day. 15.Glimepiride 50 mg at bedtime. 16.Lantus 18 units at bedtime. 17.Losartan 100 mg every morning. 18.Metformin 1000 mg twice daily. 19.Metoprolol 50 mg twice daily. 20.Nasonex as needed. 21.Nexium 40 mg at bedtime. 22.NyQuil as needed. 23.Opana 40 every 12 hours. 24.Probiotics as needed. 25.Robitussin as needed. 26.Roxicodone 15 mg up to 3 times a day per Pain Clinic. 27.Terazosin 1 mg at bedtime. 28.Topamax daily at bedtime. 29.Trileptal 150 mg twice daily. 30.Tylenol day and night over-the-counter as needed. 31.Tysabri 300 mg intravenously monthly. 32.Venlafaxine 75 mg twice daily. 33.Ventolin inhaler  every 6 to 8 hours as needed. 34.Vitamin B12 daily. 35.B6 daily. 36.NovoLog sliding scale.  Her pain medicines are all administered by the [facility].  They will have to provide her prescription for the SNF stay.     Russell L. Webb Silversmith, RN   ______________________________ [name] [name], M.D.    RLW/MEDQ  D:  07/02/2010  T:  07/02/2010  Job:  [id number]  Electronically Signed by Lauree Chandler NP-C on 07/10/2010 01:22:01 PM Electronically Signed by Durene Romans M.D. on 07/11/2010 07:03:17 AM

## 2010-07-16 ENCOUNTER — Encounter: Payer: Self-pay | Admitting: Cardiology

## 2010-07-16 ENCOUNTER — Ambulatory Visit (INDEPENDENT_AMBULATORY_CARE_PROVIDER_SITE_OTHER): Payer: Medicare Other | Admitting: Cardiology

## 2010-07-16 DIAGNOSIS — R0609 Other forms of dyspnea: Secondary | ICD-10-CM

## 2010-07-16 DIAGNOSIS — I10 Essential (primary) hypertension: Secondary | ICD-10-CM

## 2010-07-24 NOTE — Assessment & Plan Note (Signed)
Summary: f38mths per check with pa/saf   Referring Provider:  Dr. Hermelinda Dellen Primary Provider:  Jeralyn Ruths  CC:  Pt is having a lot of pain she reports.  Her MS is "acting up".  She reports that she has had blurred vision and neuropathy and is recovering from total knee replacement.  History of Present Illness: [name] is a 50 yo with history of MS, obesity, HTN, diabetes, chronic dyspnea  who recently presented for cardiology evaluation prior to left total knee replacement.  She was set up for an echo that demonstrated an EF 65-70%; severe LVH; PASP 34; and mild LAE.  She also had a myoview that demonstrated an EF 71%, no scar or ischemia.  A holter was placed and demonstrated NSR, sinus tachy and PVCs but no significant arrhythmias.     Patient did well with her operation.  She is now getting PT.  BP is under good control today.  No significant exertional dyspnea or chest pain.   Labs (2/12): K 3.5, creatinine 0.8  Current Medications (verified): 1)  Tysabri 300 Mg/15ml Conc (Natalizumab) .Marland Kitchen.. 1 Inj Every 4 Wks 2)  Flexeril 10 Mg  Tabs (Cyclobenzaprine Hcl) .Marland Kitchen.. 1 Every 12 Hrs 3)  Losartan Potassium 100 Mg Tabs (Losartan Potassium) .... Take 1 Tablet Qd 4)  Furosemide 40 Mg Tabs (Furosemide) .Marland Kitchen.. 1 Once Daily 5)  Nexium 40 Mg Cpdr (Esomeprazole Magnesium) .... Take 1 Tablet By Mouth Once A Day 6)  Baclofen 10 Mg Tabs (Baclofen) .... Take 1 Tablet By Mouth Twice A Day 7)  Roxicodone 15 Mg Tabs (Oxycodone Hcl) .Marland Kitchen.. 1 2 To 3 Times Per Day 8)  Hydroxyzine Hcl 25 Mg Tabs (Hydroxyzine Hcl) .... Take 1 Tablet By Mouth Three Times Daily.  If Needed, A Fourth Pill Can Be Taken.  This Medicine Is For Itching. 9)  Glucophage 1000 Mg Tabs (Metformin Hcl) .... Take 1 Tablet Twice A Day. 10)  Metoprolol Tartrate 50 Mg Tabs (Metoprolol Tartrate) .... Take 1 and 1/2 Tabs Two Times A Day 11)  Topamax 100 Mg Tabs (Topiramate) .... Take 1 Tablet At Bedtime 12)  Trileptal 150 Mg Tabs (Oxcarbazepine)  .... Take 1 Tablet Twice A Day. 13)  Imipramine Hcl 50 Mg Tabs (Imipramine Hcl) .... 2 Every 12 Hrs 14)  Novolog Flexpen 100 Unit/ml Soln (Insulin Aspart) .... Inject Subc Depending Blood Sugar Level. Blood Sugar 150 To 200 Inject 2 Units. 200 To 300 Use 4 Units. 300 To 350 Use 7 Units. 15)  Lantus Solostar 100 Unit/ml Soln (Insulin Glargine) .... Inject 17 Units Subcutaneously Every Night. 16)  Crestor 10 Mg Tabs (Rosuvastatin Calcium) .Marland Kitchen.. 1 At Bedtime 17)  Terazosin Hcl 1 Mg Caps (Terazosin Hcl) .... 2 At Bedtime 18)  Diclofenac Potassium 50 Mg Tabs (Diclofenac Potassium) .Marland Kitchen.. 1 Every 12 Hrs 19)  Venlafaxine Hcl 75 Mg Xr24h-Cap (Venlafaxine Hcl) .Marland Kitchen.. 1 Every 12 Hrs 20)  Align 4 Mg Caps (Probiotic Product) .... Once Daily 21)  Vitamin B-12 50 Mcg Tabs (Cyanocobalamin) .... Once Daily 22)  Vitamin B-6 50 Mg Tabs (Pyridoxine Hcl) .... Once Daily 23)  Ferro-Bob 325 (65 Fe) Mg Tabs (Ferrous Sulfate) .... Take One Tablet Once Daily  Allergies (verified): 1)  ! Morphine 2)  ! * Codine  Past History:  Past Medical History: 1. Hypertension 2. Overflow Urinary incontinence 3. Multiple Sclerosis: diagnosed in 2008 4. Paronychia with MRSA. 5. Diabetes mellitus, type II with neuropathy 6. Chronic bronchitis, clinical dx: Active smoker     -  See consult June 02, 2010 (Dr. Sherene Sires) 7. Morbid obesity 8. Hyperlipidemia 9. Restless leg syndrome 10. Chronic pain 11. Degenerative disc disease 12. OSA: Not using CPAP (did not tolerate) 13. Chronic dyspnea 14. Sinus tachycardia 15. 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 Active smoker 17. Echo 06/16/10: EF 65-70%; severe LVH; PASP 34; mild LAE 18. Myoview (06/16/10): EF 71%; no scar or ischemia 19. Holter 05/2010: NSR, sinus tachy, PVCs; no sig. arrhythmia PSH reviewed for relevance  Family History: Reviewed history from 06/06/2010 and no changes required. Diabetes- mother Negative for respiratory diseases or atopy  No premature CAD  Social  History: Reviewed history from 06/06/2010 and no changes required. Separated.  Disabled.  Denies EtOH.  Denies caffeine use. Son is living with her and helping her out. Cutting back on smoking as of 02/2009 Current smoker since age 60.  Quit smoking 1/12.  Rides in motorized scooter when out of the house.   Vital Signs:  Patient profile:   50 year old female Height:      62.5 inches Weight:      290.12 pounds BMI:     52.41 Pulse rate:   87 / minute Pulse rhythm:   regular BP sitting:   109 / 68  (right arm) Cuff size:   large  Vitals Entered By: Judithe Modest CMA (July 16, 2010 9:07 AM)  Physical Exam  General:  Well developed, well nourished, in no acute distress.  Obese.  Neck:  Neck thick, no JVD. No masses, thyromegaly or abnormal cervical nodes. Lungs:  Clear bilaterally to auscultation and percussion. Heart:  Non-displaced PMI, chest non-tender; regular rate and rhythm, S1, S2 without murmurs, rubs or gallops. Carotid upstroke normal, no bruit. Pedals normal pulses. 1+ edema 1/2 up lower legs L > R.  Abdomen:  Bowel sounds positive; abdomen soft and non-tender without masses, organomegaly, or hernias noted. No hepatosplenomegaly. Extremities:  No clubbing or cyanosis. Neurologic:  Alert and oriented x 3. Psych:  Normal affect.   Impression & Recommendations:  Problem # 1:  SMOKER (ICD-305.1) Patient has quit smoking.   Problem # 2:  DYSPNEA (ICD-786.05) Echo with normal LVF and Myoview without ischemia.  Dyspnea is likely multifactorial with some diastolic CHF but significant proportion of symptoms is likely obesity/deconditioning.   Problem # 3:  HYPERTENSION (ICD-401.9) Continue current meds, BP under control.   Patient Instructions: 1)  Your physician recommends that you schedule a follow-up appointment as needed.:  2)  Your physician recommends that you continue on your current medications as directed. Please refer to the Current Medication list given to  you today.

## 2010-07-29 ENCOUNTER — Other Ambulatory Visit: Payer: Self-pay | Admitting: Orthopedic Surgery

## 2010-07-29 ENCOUNTER — Encounter (HOSPITAL_COMMUNITY): Payer: Medicare Other

## 2010-07-29 LAB — DIFFERENTIAL
Basophils Absolute: 0 10*3/uL (ref 0.0–0.1)
Eosinophils Relative: 3 % (ref 0–5)
Lymphocytes Relative: 44 % (ref 12–46)
Monocytes Absolute: 0.6 10*3/uL (ref 0.1–1.0)
Monocytes Relative: 8 % (ref 3–12)

## 2010-07-29 LAB — BASIC METABOLIC PANEL
BUN: 7 mg/dL (ref 6–23)
CO2: 24 mEq/L (ref 19–32)
Calcium: 9.5 mg/dL (ref 8.4–10.5)
GFR calc non Af Amer: >60 mL/min (ref >60)
Glucose, Bld: 97 mg/dL (ref 70–99)

## 2010-07-29 LAB — PROTIME-INR: INR: 0.92 (ref 0.00–1.49)

## 2010-07-29 LAB — URINALYSIS, ROUTINE W REFLEX MICROSCOPIC
Bilirubin Urine: NEGATIVE
Hgb urine dipstick: NEGATIVE
Ketones, ur: NEGATIVE mg/dL
Nitrite: NEGATIVE
Urobilinogen, UA: 1 mg/dL (ref 0.0–1.0)
pH: 6 (ref 5.0–8.0)

## 2010-07-29 LAB — CBC
HCT: 37.6 % (ref 36.0–46.0)
MCH: 28.5 pg (ref 26.0–34.0)
MCHC: 33.2 g/dL (ref 30.0–36.0)
RDW: 13.5 % (ref 11.5–15.5)

## 2010-07-29 LAB — SURGICAL PCR SCREEN: MRSA, PCR: NEGATIVE

## 2010-07-31 ENCOUNTER — Ambulatory Visit (HOSPITAL_COMMUNITY)
Admission: RE | Admit: 2010-07-31 | Discharge: 2010-08-01 | Disposition: A | Discharge status: Home / Self Care | Payer: Medicare Other | Source: Ambulatory Visit | Attending: Orthopedic Surgery | Admitting: Orthopedic Surgery

## 2010-07-31 DIAGNOSIS — IMO0002 Reserved for concepts with insufficient information to code with codable children: Secondary | ICD-10-CM | POA: Insufficient documentation

## 2010-07-31 DIAGNOSIS — Z96659 Presence of unspecified artificial knee joint: Secondary | ICD-10-CM | POA: Insufficient documentation

## 2010-07-31 DIAGNOSIS — J45909 Unspecified asthma, uncomplicated: Secondary | ICD-10-CM | POA: Insufficient documentation

## 2010-07-31 DIAGNOSIS — G35 Multiple sclerosis: Secondary | ICD-10-CM | POA: Insufficient documentation

## 2010-07-31 DIAGNOSIS — T8131XA Disruption of external operation (surgical) wound, not elsewhere classified, initial encounter: Secondary | ICD-10-CM | POA: Insufficient documentation

## 2010-07-31 DIAGNOSIS — Z794 Long term (current) use of insulin: Secondary | ICD-10-CM | POA: Insufficient documentation

## 2010-07-31 DIAGNOSIS — Z87891 Personal history of nicotine dependence: Secondary | ICD-10-CM | POA: Insufficient documentation

## 2010-07-31 DIAGNOSIS — Y831 Surgical operation with implant of artificial internal device as the cause of abnormal reaction of the patient, or of later complication, without mention of misadventure at the time of the procedure: Secondary | ICD-10-CM | POA: Insufficient documentation

## 2010-07-31 DIAGNOSIS — Z01812 Encounter for preprocedural laboratory examination: Secondary | ICD-10-CM | POA: Insufficient documentation

## 2010-07-31 DIAGNOSIS — G4733 Obstructive sleep apnea (adult) (pediatric): Secondary | ICD-10-CM | POA: Insufficient documentation

## 2010-07-31 DIAGNOSIS — K219 Gastro-esophageal reflux disease without esophagitis: Secondary | ICD-10-CM | POA: Insufficient documentation

## 2010-07-31 DIAGNOSIS — I10 Essential (primary) hypertension: Secondary | ICD-10-CM | POA: Insufficient documentation

## 2010-07-31 DIAGNOSIS — Z79899 Other long term (current) drug therapy: Secondary | ICD-10-CM | POA: Insufficient documentation

## 2010-07-31 LAB — CBC
HCT: 40.4 % (ref 36.0–46.0)
Hemoglobin: 13.8 g/dL (ref 12.0–15.0)
RBC: 4.53 MIL/uL (ref 3.87–5.11)
WBC: 7.6 10*3/uL (ref 4.0–10.5)

## 2010-07-31 LAB — GLUCOSE, CAPILLARY
Glucose-Capillary: 94 mg/dL (ref 70–99)
Glucose-Capillary: 109 mg/dL — ABNORMAL HIGH (ref 70–99)
Glucose-Capillary: 124 mg/dL — ABNORMAL HIGH (ref 70–99)
Glucose-Capillary: 108 mg/dL — ABNORMAL HIGH (ref 70–99)

## 2010-07-31 LAB — TYPE AND SCREEN
ABO/RH(D): A POS
Antibody Screen: NEGATIVE

## 2010-07-31 LAB — DIFFERENTIAL
Basophils Relative: 1 % (ref 0–1)
Lymphocytes Relative: 34 % (ref 12–46)
Lymphs Abs: 2.5 10*3/uL (ref 0.7–4.0)
Monocytes Absolute: 0.4 10*3/uL (ref 0.1–1.0)
Monocytes Relative: 5 % (ref 3–12)
Neutro Abs: 4.5 10*3/uL (ref 1.7–7.7)
Neutrophils Relative %: 59 % (ref 43–77)

## 2010-07-31 LAB — POCT I-STAT, CHEM 8
BUN: 8 mg/dL (ref 6–23)
Calcium, Ion: 1.14 mmol/L (ref 1.12–1.32)
Chloride: 104 mEq/L (ref 96–112)
Glucose, Bld: 107 mg/dL — ABNORMAL HIGH (ref 70–99)
HCT: 42 % (ref 36.0–46.0)
Potassium: 3.4 mEq/L — ABNORMAL LOW (ref 3.5–5.1)

## 2010-08-01 LAB — CBC
HCT: 35.1 % — ABNORMAL LOW (ref 36.0–46.0)
MCV: 88 fL (ref 78.0–100.0)
Platelets: 257 10*3/uL (ref 150–400)
RBC: 3.99 MIL/uL (ref 3.87–5.11)
RDW: 13.6 % (ref 11.5–15.5)
WBC: 7.9 10*3/uL (ref 4.0–10.5)

## 2010-08-01 LAB — GLUCOSE, CAPILLARY: Glucose-Capillary: 114 mg/dL — ABNORMAL HIGH (ref 70–99)

## 2010-08-01 LAB — BASIC METABOLIC PANEL
Chloride: 107 mEq/L (ref 96–112)
GFR calc non Af Amer: >60 mL/min (ref >60)
Potassium: 4.4 mEq/L (ref 3.5–5.1)
Sodium: 140 mEq/L (ref 135–145)

## 2010-08-01 NOTE — H&P (Signed)
[name], [name]                 ACCOUNT NO.:  [id number]  MEDICAL RECORD NO.:  [id number]           PATIENT TYPE:  O  LOCATION:  DAYL                         FACILITY:  [facility]  PHYSICIAN:  [name]. [name], M.D.  DATE OF BIRTH:  November 28, 1960  DATE OF ADMISSION:  07/31/2010 DATE OF DISCHARGE:                             HISTORY & PHYSICAL   ADMISSION DIAGNOSIS:  Left knee wound dehiscence/suture abscess, distal portion of total knee replacement incision.  ADMITTING HISTORY:  [name] is a 50 year old female with a history of left total knee replacement performed about 1 month ago on June 30, 2010.  She presented to the office for early postoperative visit, noted that though she was doing very well clinically without constitutional symptoms, the lower portion of incision still had bit of opening with small amount of drainage.  She was placed on soap and water cleaning and antibiotics and in followup was noted not to have significant wound healing or change.  Based on this and concerns for potential deep infection, I suggested that we proceed with I and D primary wound closure.  After reviewing the risks and benefits of this, she wished to proceed.  PAST MEDICAL HISTORY:  Quite extensive including: 1. Impaired vision. 2. History of some sleep apnea. 3. Asthma and bronchitis. 4. History of hypertension. 5. Reflex. 6. Diabetes, insulin dependent. 7. History of urinary incontinence.  She has chronic fatigue and pain,     chronic pain issues.  CURRENT MEDICATIONS:  Include: 1. Tysabri 300 mg daily. 2. Flexeril 10 mg daily. 3. Losartan 50 mg daily. 4. Lasix 40 mg daily. 5. Nexium 40 mg daily. 6. Baclofen 10 mg b.i.d. 7. Roxicodone 15 mg 3 times a day as needed. 8. Hydroxyzine 25 mg p.o. q.h.s. as needed. 9. Opana ER 40 mg every 12 hours. 10.Glucophage 1000 mg twice daily. 11.Topamax 100 mg q.h.s. 12.Trileptal 150 mg b.i.d. 13.Metoprolol 50 mg b.i.d. 14.NovoLog 100 units  with sugar level as needed. 15.Lantus 100 mg q.h.s. 16.Crestor 10 mg daily. 17.Terazosin 1 mg 2 tablets at bedtime. 18.Venlafaxine 75 mg daily  DRUG ALLERGIES:  CODEINE.  SURGICAL HISTORY:  Left total knee replacement performed February 13.  SOCIAL HISTORY:  She is separated.  She does have some disability regarding diagnosis of MS.  Has history of tobacco use.  No significant drug or alcohol use.  She has 2 children currently living with their mother.  FAMILY HISTORY:  Stroke and diabetes.  REVIEW OF SYSTEMS:  Other than that reported in the history is unremarkable for any recent headaches, dizziness, blurred vision, slurred speech.  She does wear eye glasses.  No acute changes.  No productive cough, hemoptysis, wheezing with exertion.  No chest pains, no abdominal discomfort, nausea, vomiting, burning, frequency of urination.  PHYSICAL EXAMINATION:  VITAL SIGNS:  Seen and evaluated in the hospital, she was afebrile, stable vital signs. CHEST:  Clear to auscultation with distant breath sounds. HEART:  Regular. ABDOMEN:  Obese and nontender.  Left lower extremity revealed a well- healed proximal two-thirds of incision.  The distal portion had no active drainage but some fibrous  tissue in the distal portion of it. She is otherwise neurovascularly stable.  No cellulitis.  She had intact sensibility to light touch distally with palpable pulses.  ASSESSMENT:  Lower one-third wound dehiscence after total knee replacement one month ago.  PLAN:  The will be patient be admitted for same-day surgery for an I and D sharply of skin, subcutaneous tissue of the left knee with primary wound closure.  I will keep her overnight for 24 hours on antibiotics and possibly home on p.o. antibiotics until wound heals.     [name] [name], M.D.     MDO/MEDQ  D:  07/31/2010  T:  07/31/2010  Job:  [id number]  Electronically Signed by Durene Romans M.D. on 08/01/2010 03:06:29 PM

## 2010-08-01 NOTE — Op Note (Signed)
[name], [name]                 ACCOUNT NO.:  [id number]  MEDICAL RECORD NO.:  [id number]           PATIENT TYPE:  O  LOCATION:  DAYL                         FACILITY:  [facility]  PHYSICIAN:  [name]. [name], M.D.  DATE OF BIRTH:  10/25/1960  DATE OF PROCEDURE:  07/31/2010 DATE OF DISCHARGE:                              OPERATIVE REPORT   PREOPERATIVE DIAGNOSIS:  Status post left total knee replacement with a lower wound incomplete healing/knee dehiscence with a suture abscesses as primary source and no evidence of infection.  POSTOPERATIVE DIAGNOSIS/FINDING:  Status post left total knee replacement with a lower wound incomplete healing/knee dehiscence with a suture abscesses as primary source and no evidence of infection.  PROCEDURE:  Sharp incisional and debridement of skin, subcutaneous tissue with subsequent irrigation with 2 L of normal saline solution with primary wound closure of 6-8 cm in length.  SURGEON:  [name]. [name], MD ASSISTANT:  Surgical team.  ANESTHESIA:  General.  SPECIMENS:  None.  COMPLICATIONS:  None.  DRAINS:  None.  TOURNIQUET TIME:  Less than 20 minutes at 250 mmHg.  INDICATIONS FOR PROCEDURE:  Mr. Caroll is a 50 year old patient of mine with history of a left total knee replacement that was performed within the last 51 months.  She had done exceptionally well with a postoperative course and recovery with goal range of motion.  She was noted in 1 of her postop visit about 2-3 weeks ago having this area with local wound, we treat this conservatively with soap and water cleaning and antibiotics and followup.  She did not seem to have any significant improvement or primary closure, there was no evidence of purulence, and no constitutional symptoms of fevers, chills, night sweats.  Given these findings, I recommended that she proceed with an incisional debridement for management of her wound.  Risks of persistent wound issues or deep complications  with regards to infection were reviewed as the indication for this.  Consent was obtained for the purpose of wound management.  Consent obtained.  PROCEDURE IN DETAIL:  The patient was brought to operative theater. Once adequate anesthesia, preoperative antibiotics, and 2 g of Ancef administered, the patient was positioned supine.  A left thigh tourniquet was placed.  Left lower extremity was then prepped and draped in sterile fashion.  Time-out was performed identifying the patient, planned procedure, and extremity.  Leg was exsanguinated, tourniquet elevated to 250 mmHg.  The lower portion of the incision was utilized.  I extended the proximal and distal about a centimeter to the side and excised this area about 4 cm in length where the wound had not exactly healed.  Sharp dissection was carried down through the subcutaneous tissue.  Sharp debridement of subcutaneous tissue was carried out the proximal tibia and extensor mechanism identified to be intact with suture still visible.  Following the sharp debridement, I irrigated the wound out with 2 L normal saline solution pulse lavage.  Following this debridement and irrigation, I reapproximated the proximal and distal ends with 2-0 Vicryl and used 2-0 nylon sutures reapproximating the remaining of wound to eliminate her deep  subcutaneous Vicryl sutures.  Once the wound was primarily closed, the wound was cleaned, dried, and dressed sterilely with Xeroform and bulky sterile wrap.  The tourniquet was let down after.  The patient was brought to recovery room in stable condition tolerating the procedure well without issue, plan on her being hospital overnight for antibiotics.     [name] [name], M.D.     MDO/MEDQ  D:  07/31/2010  T:  07/31/2010  Job:  [id number]  Electronically Signed by Durene Romans M.D. on 08/01/2010 03:06:33 PM

## 2010-08-04 LAB — CBC
HCT: 42.2 % (ref 36.0–46.0)
Hemoglobin: 14.2 g/dL (ref 12.0–15.0)
MCHC: 33.7 g/dL (ref 30.0–36.0)
MCV: 88.2 fL (ref 78.0–100.0)
Platelets: 318 10*3/uL (ref 150–400)
RDW: 13.6 % (ref 11.5–15.5)

## 2010-08-04 LAB — DIFFERENTIAL
Basophils Absolute: 0.1 10*3/uL (ref 0.0–0.1)
Eosinophils Absolute: 0.1 10*3/uL (ref 0.0–0.7)
Eosinophils Relative: 2 % (ref 0–5)
Monocytes Absolute: 0.7 10*3/uL (ref 0.1–1.0)

## 2010-08-04 LAB — BASIC METABOLIC PANEL
BUN: 8 mg/dL (ref 6–23)
CO2: 26 mEq/L (ref 19–32)
Chloride: 108 mEq/L (ref 96–112)
Glucose, Bld: 72 mg/dL (ref 70–99)
Potassium: 3.6 mEq/L (ref 3.5–5.1)

## 2010-08-04 LAB — GLUCOSE, CAPILLARY: Glucose-Capillary: 75 mg/dL (ref 70–99)

## 2010-08-06 ENCOUNTER — Other Ambulatory Visit (INDEPENDENT_AMBULATORY_CARE_PROVIDER_SITE_OTHER): Payer: Medicare Other | Admitting: *Deleted

## 2010-08-06 DIAGNOSIS — E119 Type 2 diabetes mellitus without complications: Secondary | ICD-10-CM

## 2010-08-07 MED ORDER — INSULIN GLARGINE 100 UNIT/ML ~~LOC~~ SOLN: SUBCUTANEOUS | Status: DC

## 2010-08-11 NOTE — Telephone Encounter (Signed)
Refusal of refill and pharmacy notified that pt is no longer seen in the Clinics.

## 2010-08-11 NOTE — Telephone Encounter (Signed)
Scherrie Gerlach made this note 10/31: wants to know if she can continue to see CDE in our office (me) when she has changed her primary care doctor to [facility] [address].  Discussed with director who informed me that we are currently unable to take patients outside this practice right now.  Pt no longer our pt. Denied refill. Doris, would you pls change her PCP? Thnaks!

## 2010-08-14 ENCOUNTER — Ambulatory Visit: Payer: Medicare Other

## 2010-08-14 ENCOUNTER — Encounter: Payer: Medicare Other | Attending: Physical Medicine & Rehabilitation

## 2010-08-14 DIAGNOSIS — E1149 Type 2 diabetes mellitus with other diabetic neurological complication: Secondary | ICD-10-CM | POA: Insufficient documentation

## 2010-08-14 DIAGNOSIS — M7989 Other specified soft tissue disorders: Secondary | ICD-10-CM | POA: Insufficient documentation

## 2010-08-14 DIAGNOSIS — G35 Multiple sclerosis: Secondary | ICD-10-CM

## 2010-08-14 DIAGNOSIS — G894 Chronic pain syndrome: Secondary | ICD-10-CM

## 2010-08-14 DIAGNOSIS — E669 Obesity, unspecified: Secondary | ICD-10-CM

## 2010-08-14 DIAGNOSIS — M1710 Unilateral primary osteoarthritis, unspecified knee: Secondary | ICD-10-CM | POA: Insufficient documentation

## 2010-08-14 DIAGNOSIS — IMO0002 Reserved for concepts with insufficient information to code with codable children: Secondary | ICD-10-CM

## 2010-08-14 DIAGNOSIS — E1142 Type 2 diabetes mellitus with diabetic polyneuropathy: Secondary | ICD-10-CM | POA: Insufficient documentation

## 2010-08-14 DIAGNOSIS — R52 Pain, unspecified: Secondary | ICD-10-CM | POA: Insufficient documentation

## 2010-08-14 DIAGNOSIS — M47817 Spondylosis without myelopathy or radiculopathy, lumbosacral region: Secondary | ICD-10-CM | POA: Insufficient documentation

## 2010-08-15 ENCOUNTER — Inpatient Hospital Stay (HOSPITAL_COMMUNITY)
Admission: EM | Admit: 2010-08-15 | Discharge: 2010-08-23 | DRG: 920 | Disposition: A | Discharge status: Home / Self Care | Payer: Medicare Other | Attending: Orthopedic Surgery | Admitting: Orthopedic Surgery

## 2010-08-15 DIAGNOSIS — K219 Gastro-esophageal reflux disease without esophagitis: Secondary | ICD-10-CM | POA: Diagnosis present

## 2010-08-15 DIAGNOSIS — T8189XA Other complications of procedures, not elsewhere classified, initial encounter: Principal | ICD-10-CM | POA: Diagnosis present

## 2010-08-15 DIAGNOSIS — G4733 Obstructive sleep apnea (adult) (pediatric): Secondary | ICD-10-CM | POA: Diagnosis present

## 2010-08-15 DIAGNOSIS — Y831 Surgical operation with implant of artificial internal device as the cause of abnormal reaction of the patient, or of later complication, without mention of misadventure at the time of the procedure: Secondary | ICD-10-CM | POA: Diagnosis present

## 2010-08-15 DIAGNOSIS — H547 Unspecified visual loss: Secondary | ICD-10-CM | POA: Diagnosis present

## 2010-08-15 DIAGNOSIS — Z96659 Presence of unspecified artificial knee joint: Secondary | ICD-10-CM

## 2010-08-15 DIAGNOSIS — J45909 Unspecified asthma, uncomplicated: Secondary | ICD-10-CM | POA: Diagnosis present

## 2010-08-15 DIAGNOSIS — R32 Unspecified urinary incontinence: Secondary | ICD-10-CM | POA: Diagnosis present

## 2010-08-15 DIAGNOSIS — I10 Essential (primary) hypertension: Secondary | ICD-10-CM | POA: Diagnosis present

## 2010-08-15 DIAGNOSIS — G8929 Other chronic pain: Secondary | ICD-10-CM | POA: Diagnosis present

## 2010-08-15 DIAGNOSIS — G35 Multiple sclerosis: Secondary | ICD-10-CM | POA: Diagnosis present

## 2010-08-15 DIAGNOSIS — E119 Type 2 diabetes mellitus without complications: Secondary | ICD-10-CM | POA: Diagnosis present

## 2010-08-15 DIAGNOSIS — IMO0002 Reserved for concepts with insufficient information to code with codable children: Secondary | ICD-10-CM | POA: Diagnosis present

## 2010-08-15 DIAGNOSIS — Z794 Long term (current) use of insulin: Secondary | ICD-10-CM

## 2010-08-15 DIAGNOSIS — Z79899 Other long term (current) drug therapy: Secondary | ICD-10-CM

## 2010-08-15 LAB — BASIC METABOLIC PANEL
CO2: 26 mEq/L (ref 19–32)
Calcium: 9.3 mg/dL (ref 8.4–10.5)
Creatinine, Ser: 0.83 mg/dL (ref 0.4–1.2)
GFR calc non Af Amer: >60 mL/min (ref >60)
Glucose, Bld: 109 mg/dL — ABNORMAL HIGH (ref 70–99)
Sodium: 139 mEq/L (ref 135–145)

## 2010-08-15 LAB — DIFFERENTIAL
Basophils Absolute: 0 10*3/uL (ref 0.0–0.1)
Basophils Relative: 0 % (ref 0–1)
Eosinophils Relative: 2 % (ref 0–5)
Monocytes Absolute: 0.5 10*3/uL (ref 0.1–1.0)
Monocytes Relative: 7 % (ref 3–12)

## 2010-08-15 LAB — URINALYSIS, MICROSCOPIC ONLY
Glucose, UA: NEGATIVE mg/dL
Hgb urine dipstick: NEGATIVE
Leukocytes, UA: NEGATIVE
Specific Gravity, Urine: 1.016 (ref 1.005–1.030)
pH: 6.5 (ref 5.0–8.0)

## 2010-08-15 LAB — CBC
HCT: 38 % (ref 36.0–46.0)
Hemoglobin: 12.7 g/dL (ref 12.0–15.0)
MCH: 28.3 pg (ref 26.0–34.0)
MCHC: 33.4 g/dL (ref 30.0–36.0)
RDW: 12.9 % (ref 11.5–15.5)

## 2010-08-15 LAB — APTT: aPTT: 33 seconds (ref 24–37)

## 2010-08-16 LAB — GLUCOSE, CAPILLARY
Glucose-Capillary: 105 mg/dL — ABNORMAL HIGH (ref 70–99)
Glucose-Capillary: 120 mg/dL — ABNORMAL HIGH (ref 70–99)
Glucose-Capillary: 90 mg/dL (ref 70–99)

## 2010-08-17 LAB — URINE CULTURE
Colony Count: 4000
Culture  Setup Time: 201203310129
Special Requests: NEGATIVE

## 2010-08-17 LAB — GLUCOSE, CAPILLARY: Glucose-Capillary: 109 mg/dL — ABNORMAL HIGH (ref 70–99)

## 2010-08-18 LAB — GLUCOSE, CAPILLARY
Glucose-Capillary: 105 mg/dL — ABNORMAL HIGH (ref 70–99)
Glucose-Capillary: 108 mg/dL — ABNORMAL HIGH (ref 70–99)
Glucose-Capillary: 86 mg/dL (ref 70–99)
Glucose-Capillary: 94 mg/dL (ref 70–99)

## 2010-08-18 NOTE — H&P (Signed)
NAMESHAMONA, WIRTZ                 ACCOUNT NO.:  [id number]  MEDICAL RECORD NO.:  [id number]           PATIENT TYPE:  I  LOCATION:  1532                         FACILITY:  [facility]  PHYSICIAN:  [name]. [name], M.D.  DATE OF BIRTH:  [birth year]  DATE OF ADMISSION:  08/15/2010 DATE OF DISCHARGE:                             HISTORY & PHYSICAL   ADMISSION DIAGNOSIS:  Nonhealing wound, left knee.  BRIEF HISTORY:  Ms. Delone is a 50 year old patient of mine with a history of a left total knee replacement performed in February 2012.  She had been followed in the office and actually underwent a formal I and D of her left knee wound on March 15th.  She had been seen and evaluated in the office in followup on the 22nd noting some persistent drainage; however, when I tried to remove one of her sutures, the wound dehisced more.  I closed this in the office under a sterile prep; however, she came into the office today on the 30th with some persistent serosanguineous oozing.  She has had no fevers, chills, night sweats. Her knee continues to do well overall clinically.  Based on this persistent drainage, I am admitting her to the hospital for wound evaluation, consideration of reoperation and IV antibiotics.  PAST MEDICAL HISTORY: 1. Impaired vision. 2. Sleep apnea. 3. Asthma. 4. Bronchitis. 5. History of hypertension. 6. Reflux disease. 7. Insulin-dependent diabetes. 8. History of urinary incontinence. 9. She also has some chronic pain and fatigue issues.  CURRENT MEDICATIONS: 1. Baclofen 10 mg b.i.d. 2. Crestor 10 mg q.h.s. 3. Diclofenac 50 mg daily as needed. 4. Flexeril 10 mg t.i.d. as needed. 5. Lasix 40 mg daily as needed for edema. 6. Hydroxyzine 25 mg t.i.d. as needed. 7. Imipramine 50 mg q.h.s. 8. Lantus insulin 18 units q.h.s. 9. Losartan 100 mg q.a.m. 10.Metformin 1000 mg b.i.d. 11.Metoprolol 75 mg b.i.d. 12.Nexium 40 mg q.h.s. 13.Opana ER 40 mg every 12  hours. 14.Potassium 20 mEq b.i.d. 15.Oxycodone 15 mg as needed. 16.Terazosin 2 mg q.h.s. 17.Topamax 100 mg q.h.s. 18.Trileptal 150 mg b.i.d. 19.Venlafaxine 75 mg b.i.d. 20.Vitamin B12 daily. 21.Vitamin B6 daily.  DRUG ALLERGIES:  CODEINE.  SOCIAL HISTORY:  She is separated, lives with family.  She is disabled related to a diagnosis of multiple sclerosis.  Has a history of tobacco use.  No significant drug or alcohol use.  FAMILY HISTORY:  Stroke and diabetes.  REVIEW OF SYSTEMS:  There has been no fevers, chills, night sweats, or weight loss.  The only problem has been this left lower extremity.  She has had no chest pains, productive cough, hemoptysis.  No unilateral weakness or numbness.  No shortness of breath.  No abdominal discomfort, nausea, vomiting, constipation, or diarrhea.  PHYSICAL EXAMINATION:  VITAL SIGNS:  In the office, she had stable vital signs.  She was afebrile with a pulse of 82, blood pressure 132/78. LOWER EXTREMITY EXAM:  Reveals no cellulitis.  She has a small area on the distal quarter of her incision with some serous drainage.  She otherwise has full knee extension and full flexion.  Knee ligaments  remain stable.  No cellulitis of her lower extremity.  ASSESSMENT:  Persistent nonhealing left knee wound following total knee replacement.  PLAN:  Ms. Stanbery is going to be admitted to the hospital with IV antibiotics, consideration of reoperation depending on how the wound does over the next 24-72 hours.  She will be placed on Ancef q.8 h. as well as Cipro b.i.d. p.o.  Questions were encouraged and answers reviewed.     [name] [name], M.D.     MDO/MEDQ  D:  08/17/2010  T:  08/18/2010  Job:  [id number]  Electronically Signed by Durene Romans M.D. on 08/18/2010 12:15:00 PM

## 2010-08-19 LAB — GLUCOSE, CAPILLARY
Glucose-Capillary: 101 mg/dL — ABNORMAL HIGH (ref 70–99)
Glucose-Capillary: 89 mg/dL (ref 70–99)

## 2010-08-20 ENCOUNTER — Encounter: Payer: Self-pay | Admitting: Cardiology

## 2010-08-20 LAB — BASIC METABOLIC PANEL
BUN: 4 mg/dL — ABNORMAL LOW (ref 6–23)
Chloride: 111 mEq/L (ref 96–112)
Creatinine, Ser: 0.76 mg/dL (ref 0.4–1.2)
Glucose, Bld: 88 mg/dL (ref 70–99)

## 2010-08-20 LAB — GLUCOSE, CAPILLARY
Glucose-Capillary: 98 mg/dL (ref 70–99)
Glucose-Capillary: 91 mg/dL (ref 70–99)
Glucose-Capillary: 87 mg/dL (ref 70–99)
Glucose-Capillary: 102 mg/dL — ABNORMAL HIGH (ref 70–99)

## 2010-08-20 LAB — CBC
HCT: 31.5 % — ABNORMAL LOW (ref 36.0–46.0)
MCH: 28.1 pg (ref 26.0–34.0)
MCV: 87.7 fL (ref 78.0–100.0)
Platelets: 274 10*3/uL (ref 150–400)
RDW: 13 % (ref 11.5–15.5)

## 2010-08-21 LAB — GLUCOSE, CAPILLARY
Glucose-Capillary: 93 mg/dL (ref 70–99)
Glucose-Capillary: 101 mg/dL — ABNORMAL HIGH (ref 70–99)
Glucose-Capillary: 331 mg/dL — ABNORMAL HIGH (ref 70–99)
Glucose-Capillary: 106 mg/dL — ABNORMAL HIGH (ref 70–99)
Glucose-Capillary: 163 mg/dL — ABNORMAL HIGH (ref 70–99)
Glucose-Capillary: 218 mg/dL — ABNORMAL HIGH (ref 70–99)
Glucose-Capillary: 220 mg/dL — ABNORMAL HIGH (ref 70–99)
Glucose-Capillary: 263 mg/dL — ABNORMAL HIGH (ref 70–99)
Glucose-Capillary: 274 mg/dL — ABNORMAL HIGH (ref 70–99)
Glucose-Capillary: 297 mg/dL — ABNORMAL HIGH (ref 70–99)
Glucose-Capillary: 298 mg/dL — ABNORMAL HIGH (ref 70–99)
Glucose-Capillary: 328 mg/dL — ABNORMAL HIGH (ref 70–99)
Glucose-Capillary: 404 mg/dL — ABNORMAL HIGH (ref 70–99)
Glucose-Capillary: 480 mg/dL — ABNORMAL HIGH (ref 70–99)

## 2010-08-21 LAB — CBC
HCT: 42.7 % (ref 36.0–46.0)
HCT: 46.7 % — ABNORMAL HIGH (ref 36.0–46.0)
Hemoglobin: 14.6 g/dL (ref 12.0–15.0)
Hemoglobin: 16.1 g/dL — ABNORMAL HIGH (ref 12.0–15.0)
MCHC: 34.4 g/dL (ref 30.0–36.0)
MCHC: 34.4 g/dL (ref 30.0–36.0)
MCHC: 34.4 g/dL (ref 30.0–36.0)
MCV: 87.6 fL (ref 78.0–100.0)
MCV: 89 fL (ref 78.0–100.0)
Platelets: 162 10*3/uL (ref 150–400)
RBC: 4.42 MIL/uL (ref 3.87–5.11)
RBC: 4.87 MIL/uL (ref 3.87–5.11)
RBC: 5.33 MIL/uL — ABNORMAL HIGH (ref 3.87–5.11)
RDW: 12.7 % (ref 11.5–15.5)
WBC: 9.1 10*3/uL (ref 4.0–10.5)
WBC: 9.3 10*3/uL (ref 4.0–10.5)

## 2010-08-21 LAB — PHOSPHORUS: Phosphorus: 3.8 mg/dL (ref 2.3–4.6)

## 2010-08-21 LAB — URINALYSIS, MICROSCOPIC ONLY
Bilirubin Urine: NEGATIVE
Glucose, UA: >1000 mg/dL — AB
Protein, ur: 30 mg/dL — AB
Specific Gravity, Urine: 1.028 (ref 1.005–1.030)

## 2010-08-21 LAB — LIPID PANEL
HDL: 52 mg/dL (ref >39)
Total CHOL/HDL Ratio: 4.6 RATIO
Triglycerides: 249 mg/dL — ABNORMAL HIGH (ref <150)
VLDL: 50 mg/dL — ABNORMAL HIGH (ref 0–40)

## 2010-08-21 LAB — MAGNESIUM: Magnesium: 2.4 mg/dL (ref 1.5–2.5)

## 2010-08-21 LAB — COMPREHENSIVE METABOLIC PANEL
AST: 30 U/L (ref 0–37)
CO2: 25 mEq/L (ref 19–32)
Calcium: 9.5 mg/dL (ref 8.4–10.5)
Chloride: 102 mEq/L (ref 96–112)
Creatinine, Ser: 0.92 mg/dL (ref 0.4–1.2)
GFR calc Af Amer: >60 mL/min (ref >60)
GFR calc non Af Amer: >60 mL/min (ref >60)
Glucose, Bld: 483 mg/dL — ABNORMAL HIGH (ref 70–99)
Total Bilirubin: 0.9 mg/dL (ref 0.3–1.2)

## 2010-08-21 LAB — URINALYSIS, ROUTINE W REFLEX MICROSCOPIC
Bilirubin Urine: NEGATIVE
Glucose, UA: NEGATIVE mg/dL
Hgb urine dipstick: NEGATIVE
Ketones, ur: NEGATIVE mg/dL
Nitrite: NEGATIVE
Specific Gravity, Urine: 1.013 (ref 1.005–1.030)
pH: 7.5 (ref 5.0–8.0)

## 2010-08-21 LAB — BASIC METABOLIC PANEL
BUN: 7 mg/dL (ref 6–23)
CO2: 27 mEq/L (ref 19–32)
CO2: 30 mEq/L (ref 19–32)
Calcium: 9.2 mg/dL (ref 8.4–10.5)
Chloride: 101 mEq/L (ref 96–112)
Chloride: 109 mEq/L (ref 96–112)
Creatinine, Ser: 0.66 mg/dL (ref 0.4–1.2)
Creatinine, Ser: 0.68 mg/dL (ref 0.4–1.2)
GFR calc Af Amer: >60 mL/min (ref >60)
GFR calc non Af Amer: >60 mL/min (ref >60)
Glucose, Bld: 205 mg/dL — ABNORMAL HIGH (ref 70–99)
Glucose, Bld: 253 mg/dL — ABNORMAL HIGH (ref 70–99)
Potassium: 3.7 mEq/L (ref 3.5–5.1)
Sodium: 142 mEq/L (ref 135–145)

## 2010-08-21 LAB — URINE MICROSCOPIC-ADD ON

## 2010-08-21 LAB — URINE CULTURE

## 2010-08-21 LAB — OSMOLALITY, URINE: Osmolality, Ur: 500 mOsm/kg (ref 390–1090)

## 2010-08-21 LAB — HEMOGLOBIN A1C: Hgb A1c MFr Bld: 8.9 % — ABNORMAL HIGH (ref 4.6–6.1)

## 2010-08-21 LAB — OSMOLALITY: Osmolality: 310 mOsm/kg — ABNORMAL HIGH (ref 275–300)

## 2010-08-22 LAB — URINE CULTURE
Culture  Setup Time: 201204050533
Culture: NO GROWTH
Special Requests: NEGATIVE

## 2010-08-22 LAB — GLUCOSE, CAPILLARY
Glucose-Capillary: 82 mg/dL (ref 70–99)
Glucose-Capillary: 105 mg/dL — ABNORMAL HIGH (ref 70–99)

## 2010-08-22 LAB — POCT URINALYSIS DIP (DEVICE)
Ketones, ur: NEGATIVE mg/dL
Protein, ur: 30 mg/dL — AB
Specific Gravity, Urine: 1.01 (ref 1.005–1.030)
pH: 6.5 (ref 5.0–8.0)

## 2010-08-23 LAB — GLUCOSE, CAPILLARY
Glucose-Capillary: 87 mg/dL (ref 70–99)
Glucose-Capillary: 93 mg/dL (ref 70–99)

## 2010-08-25 LAB — DIFFERENTIAL
Basophils Relative: 1 % (ref 0–1)
Eosinophils Absolute: 0.1 10*3/uL (ref 0.0–0.7)
Eosinophils Relative: 1 % (ref 0–5)
Lymphs Abs: 4.3 10*3/uL — ABNORMAL HIGH (ref 0.7–4.0)
Monocytes Absolute: 0.5 10*3/uL (ref 0.1–1.0)
Monocytes Relative: 7 % (ref 3–12)

## 2010-08-25 LAB — CBC
HCT: 40.6 % (ref 36.0–46.0)
Hemoglobin: 15 g/dL (ref 12.0–15.0)
MCHC: 34.1 g/dL (ref 30.0–36.0)
MCV: 89.8 fL (ref 78.0–100.0)
Platelets: 225 10*3/uL (ref 150–400)
Platelets: 250 10*3/uL (ref 150–400)
RDW: 12.6 % (ref 11.5–15.5)

## 2010-08-25 LAB — COMPREHENSIVE METABOLIC PANEL
ALT: 31 U/L (ref 0–35)
AST: 48 U/L — ABNORMAL HIGH (ref 0–37)
Albumin: 3 g/dL — ABNORMAL LOW (ref 3.5–5.2)
Alkaline Phosphatase: 88 U/L (ref 39–117)
GFR calc Af Amer: >60 mL/min (ref >60)
Glucose, Bld: 176 mg/dL — ABNORMAL HIGH (ref 70–99)
Potassium: 3.2 mEq/L — ABNORMAL LOW (ref 3.5–5.1)
Sodium: 138 mEq/L (ref 135–145)
Total Protein: 6.8 g/dL (ref 6.0–8.3)

## 2010-08-25 LAB — URINALYSIS, ROUTINE W REFLEX MICROSCOPIC
Bilirubin Urine: NEGATIVE
Glucose, UA: NEGATIVE mg/dL
Ketones, ur: NEGATIVE mg/dL
Nitrite: NEGATIVE
Protein, ur: 30 mg/dL — AB
pH: 7.5 (ref 5.0–8.0)

## 2010-08-25 LAB — BASIC METABOLIC PANEL
BUN: 8 mg/dL (ref 6–23)
BUN: 5 mg/dL — ABNORMAL LOW (ref 6–23)
CO2: 30 mEq/L (ref 19–32)
Chloride: 104 mEq/L (ref 96–112)
Creatinine, Ser: 0.75 mg/dL (ref 0.4–1.2)
Creatinine, Ser: 0.62 mg/dL (ref 0.4–1.2)
GFR calc non Af Amer: >60 mL/min (ref >60)
Glucose, Bld: 99 mg/dL (ref 70–99)
Potassium: 4.3 mEq/L (ref 3.5–5.1)
Potassium: 3.5 mEq/L (ref 3.5–5.1)

## 2010-08-25 LAB — RPR: RPR Ser Ql: NONREACTIVE

## 2010-08-25 LAB — VITAMIN B12: Vitamin B-12: 711 pg/mL (ref 211–911)

## 2010-08-25 LAB — URINE MICROSCOPIC-ADD ON

## 2010-08-25 NOTE — Discharge Summary (Signed)
[name], Bright                 ACCOUNT NO.:  [id number]  MEDICAL RECORD NO.:  [id number]           PATIENT TYPE:  I  LOCATION:  1609                         FACILITY:  [facility]  PHYSICIAN:  [name]. [name], M.D.  DATE OF BIRTH:  06/01/1960  DATE OF ADMISSION:  08/15/2010 DATE OF DISCHARGE:  08/23/2010                              DISCHARGE SUMMARY   ADMITTING DIAGNOSIS:  Nonhealing left knee wound following total knee replacement.  DISCHARGE DIAGNOSES: 1. Nonhealing left knee wound status post incision and drainage on     August 19, 2010. 2. Impaired vision. 3. Sleep apnea. 4. Asthma, bronchitis. 5. History of hypertension. 6. Reflux disease. 7. Diabetes. 8. Obesity. 9. Urinary incontinence. 10.Chronic pain and fatigue issues.  HISTORY:  Ms. [name] is a 50 year old patient of mine with a total knee replacement performed on June 30, 2010.  She had done exceptionally well early on with her range of motion and strength and developed suture abscess that ultimately led to some opening of her wound and led to an operation on July 31, 2010, where I took her back to the operating room, did a formal I and D.  I have been following her wound in the office, however, have raised concerns with persistent serosanguineous drainage.  August 15, 2010, she was seen and evaluated in the office and I felt that she need to be taken back to the operating room.  She is reluctant on this and we at least compromised on IV antibiotics to see if we can get things settle down on observation.  She is subsequent admitted.  HOSPITAL COURSE:  The patient was admitted to the hospital on August 15, 2010.  She was placed on IV Ancef as well as her home medications.  She had initial labs done at that time revealing a normal INR of 0.93, a urinalysis that was negative for any leukocytes or nitrites.  Her electrolytes were stable and her white count was 6.6 with hematocrit of 38.  She basically remained  in this state on IV antibiotics at this point through the weekend.  When she was seen and evaluated on Monday, August 18, 2010, there was still some persistent drainage and I suggested that she may need to go to the operating room.  She is still bit reluctant about it but was understanding.  On August 19, 2010, she was taken to the operating room and underwent repeat a formal I and D, excising some of the old and nonviable tissue, I was able to get the wound primarily closed though I had hoped to have her Hemovac in place, it was somewhat partially removed and nonfunctional by the morning of August 20, 2010 and it was removed.  Please see dictated operative note for details of the procedure.  Postoperatively, I did labs which revealed a drop in her hematocrit just to 31.5.  There was no significant bleeding, may have been fluid shift.  Though again I wanted the Hemovac to remain in place for couple of days, it needed to be removed on postop day #1 since nonfunctional.  I did not take her  dressing down until postoperative day #3, keeping it wrapped and I evaluated her wound on August 22, 2010.  I found the wound itself looked as good as it looked in about a month. There was no sign of any obvious significant drainage, no cellulitis. After discussion with her at that time, the plan is for her to be discharge the following day.  On postoperative day #2, I stopped her Ancef IV, took out her IVs due to her urinary incontinence issue and placed her on oral Bactrim and on doxycycline.  DISCHARGE INSTRUCTIONS:  The patient is to be discharged to home on August 23, 2010, postop day #4 from her procedure.  She is going to apply dry gauze on to the anterior aspect of her wound.  She may shower as long as her wound stays dry.  She is not to try to touch or clean it at this point.  She will return to see Dr. Durene Romans at [facility] at [phone] in about 10 days, a week from this  coming Wednesday as I discussed with her.  She will be on a regular diet that should be heart healthy and more of a diabetic type diet but she states she tries to manage this herself.  DISCHARGE MEDICATIONS:  Her discharge medications at this point are going to include: 1. Doxycycline 100 mg p.o. b.i.d. for at least 21 days. 2. Bactrim DS b.i.d. for 3 weeks.  Otherwise she will be on her home meds which include: 1. Baclofen 10 mg b.i.d. 2. Crestor 10 mg q.h.s. 3. Diclofenac as needed for pain. 4. Flexeril 10 mg p.o. b.i.d. as needed. 5. Lasix 40 mg 1 to 2 tablets b.i.d. 6. Hydroxyzine 25 mg b.i.d. 7. Imipramine 50 mg q.h.s. 8. Lantus 18 units q.h.s. 9. Losartan 100 mg q.a.m. 10.Metformin 1000 mg b.i.d. 11.Metoprolol 75 mg b.i.d. 12.Nasonex 2 sprays daily as needed. 13.Nexium 40 mg q.h.s. 14.Opana 40 mg every 12 hours. 15.Oxycodone 5 mg 1 to 15 every 4 to 4 as needed for pain. 16.Potassium 20 mEq b.i.d. 17.Probiotic q.h.s. 18.Terazosin 1 mg 2 tablets q.h.s. 19.Topamax 100 mg q.h.s. 20.Trileptal 150 mg b.i.d. 21.Venlafaxine 75 mg b.i.d. 22.Ventolin 2 puffs q.6 h as needed. 23.Vitamin B12 daily. 24.Vitamin B6 daily.     [name] [name], M.D.     MDO/MEDQ  D:  08/23/2010  T:  08/23/2010  Job:  [id number]  Electronically Signed by Durene Romans M.D. on 08/25/2010 01:53:06 PM

## 2010-08-25 NOTE — Op Note (Signed)
[name], [name]                 ACCOUNT NO.:  [id number]  MEDICAL RECORD NO.:  [id number]           PATIENT TYPE:  I  LOCATION:  1609                         FACILITY:  [facility]  PHYSICIAN:  [name]. [name], M.D.  DATE OF BIRTH:  10/09/1960  DATE OF PROCEDURE:  08/19/2010 DATE OF DISCHARGE:  08/23/2010                              OPERATIVE REPORT   PREOPERATIVE DIAGNOSIS:  Nonhealing left knee wound inferior third or distal third following total-knee replacement approximately one month ago.  POSTOPERATIVE DIAGNOSIS:  Nonhealing left knee wound inferior third or distal third following total-knee replacement approximately one month ago.  FINDINGS:  The patient had no evidence of obvious sepsis.  There was old hematoma inside this area with serosanguineous oozing.  The extensor mechanism was intact under direct visualization.  PROCEDURE:  Incision and drainage of the left knee including the skin, subcutaneous tissue and following irrigation with 6 liters of normal saline solution.  SURGEON:  [name]. [name], M.D.  ASSISTANT:  Surgical Team.  ANESTHESIA:  General.  TOURNIQUET TIME:  38 minutes at 300 mmHg.  COMPLICATIONS:  None.  SPECIMENS:  None.  DRAINS:  One Hemovac.  INDICATIONS FOR PROCEDURE:  [name] is a 50 year old patient of mine with a history of a left total-knee replacement.  She had done exceptionally well with her overall recovery in regards to pain. However she developed a nonhealing wound on the distal aspect of her incision.  Her knee replacement was done in February 2012 and a month later I had taken her back to the operating room and incised and drained this area.  She had been seen in the office recently with persistent drainage and I recommended that she be readmitted to the hospital.  She was admitted to the hospital on March 30 for IV antibiotics.  After 2 to 3 days of observation, there was no significant improvement in her wound and I felt  that it was best to consider going back to the operating room.  The risks and benefits and the necessity of the procedure were discussed and reviewed.  PROCEDURE IN DETAIL:  The patient was brought to the operative theater. Once adequate anesthesia and preoperative antibiotics were administered, she was positioned supine.  A left thigh tourniquet was placed.  The left lower extremity was then prepped and draped in the sterile fashion. The leg was exsanguinated, tourniquet elevated to 300 mmHg.  Time-out was performed identifying the patient, planned procedure and extremity. Old sutures were removed and the wound extended.  I extended it proximally about a centimeter to and then distally about 2 cm and made an elliptical incision around the area of nonhealing wound tissue.  This nonviable tissue was debrided sharply.  At this point, soft tissue planes were created.  The extensor mechanism was found to be intact and no effusion noted.  At this point, following some gentle debridement of the soft tissues, I used pulse lavage and irrigated this wound out with 6 liters of normal saline solution.  The pulse lavage was aggravating enough to the tissues that it debrided old hematoma and other soft tissue.  Following the debridement of the wound and irrigation with 6 liters of normal saline solution, I used 2-0 Vicryl to reapproximate the subcutaneous tissue.  I then used 2-0 Vicryl sutures and starting from proximal and distal worked my way to the middle portion of the wound trying to avoid previous areas of less viable tissue.  I was able to get the incision sealed.  I did put a medium Hemovac into the space in an effort to try to draw out some of this fluid to prevent pressure.  Following closure, the wound was cleaned and dried.  The wound was dressed at this point with Xeroform and a sterile dressing and a wrap. She was then brought to the recovery room in stable condition and extubated,  tolerating the procedure well.     [name] [name], M.D.     MDO/MEDQ  D:  08/23/2010  T:  08/23/2010  Job:  [id number]  Electronically Signed by Durene Romans M.D. on 08/25/2010 01:53:02 PM

## 2010-09-10 ENCOUNTER — Encounter: Payer: Medicare Other | Attending: Physical Medicine & Rehabilitation | Admitting: Physical Medicine & Rehabilitation

## 2010-09-10 DIAGNOSIS — M47817 Spondylosis without myelopathy or radiculopathy, lumbosacral region: Secondary | ICD-10-CM | POA: Insufficient documentation

## 2010-09-10 DIAGNOSIS — E1142 Type 2 diabetes mellitus with diabetic polyneuropathy: Secondary | ICD-10-CM | POA: Insufficient documentation

## 2010-09-10 DIAGNOSIS — G35 Multiple sclerosis: Secondary | ICD-10-CM | POA: Insufficient documentation

## 2010-09-10 DIAGNOSIS — E1149 Type 2 diabetes mellitus with other diabetic neurological complication: Secondary | ICD-10-CM | POA: Insufficient documentation

## 2010-09-10 DIAGNOSIS — M129 Arthropathy, unspecified: Secondary | ICD-10-CM | POA: Insufficient documentation

## 2010-09-10 DIAGNOSIS — Z96659 Presence of unspecified artificial knee joint: Secondary | ICD-10-CM | POA: Insufficient documentation

## 2010-09-10 DIAGNOSIS — M25569 Pain in unspecified knee: Secondary | ICD-10-CM | POA: Insufficient documentation

## 2010-09-10 DIAGNOSIS — E669 Obesity, unspecified: Secondary | ICD-10-CM

## 2010-09-10 DIAGNOSIS — G894 Chronic pain syndrome: Secondary | ICD-10-CM

## 2010-09-10 DIAGNOSIS — M1710 Unilateral primary osteoarthritis, unspecified knee: Secondary | ICD-10-CM

## 2010-09-11 NOTE — Assessment & Plan Note (Signed)
[name] is back regarding her MS and abnormal pain complaints.  She had her left knee replaced by Dr. [name], a few weeks ago and  asides having few wound issues, has done pretty well.  She notes that her knee pain is improved as well as quality of her gait.  Pain still is 8/10.  Pain is sharp, stabbing, burning, tingling, aching.  Sleep is poor at times. She continues to have some pain related to her MS.  REVIEW OF SYSTEMS:  Notable for occasional bladder control problems, weakness, weight loss of 30 pounds.  Her appetite has been poor in general.  Full 12-point review is in the written health and history section of the chart.  SOCIAL HISTORY:  The patient is moving to [place] this week along with rest of her family initially where they are from.  PHYSICAL EXAMINATION:  VITAL SIGNS:  Blood pressure 151/88, pulse is 117, respiratory rate is 18, she is satting 98% on room air. GENERAL:  The patient actually walked into the room today using her cane. MUSCULOSKELETAL:  She walks with antalgia in the right, more in the left side.  Gait is wide based.  She has 1+ edema still in the left lower extremity trace on the right.  She is healing left knee incision which appears intact.  There is some mild scarring and swelling surrounding the incision still.  She remains hyperactive in reflexes with decreased sensory exam to pinprick and light touch distally in all 4 limbs. Strength is generally 4 to 5 out of 5 in all limbs with some pain inhibition particularly at the left knee which still is in the realm of 3+ out of 5. NEUROLOGIC:  Cognitively, she is alert and appropriate.  Cranial nerve exam is intact. HEART:  Tachycardic, but likely is related to her recent walk down the hall to the room. CHEST:  Clear. ABDOMEN:  Soft, nontender.  She has lost weight, but still is overweight.  ASSESSMENT: 1. Relapsing remitting multiple sclerosis. 2. Diabetic peripheral neuropathy. 3. Morbid obesity. 4.  Osteoarthritis, left knee with meniscal injury. 5. Lumbar spondylosis with facet arthropathy.  PLAN: 1. I wrote patient to refill prescription for her Opana ER and     Roxicodone #60, #90 respectively.  She should not be using     additional oxycodone 5 for breakthrough pain as she already does     have breakthrough  Roxicodone ordered. 2. Recommended stopping Topamax, observing for an effect.  It would be     nice to decrease her medication more slightly. 3. The patient will see her pain physician in [place], and has an     appointment apparently in June, although her medications will not     last that long.  I talked with her about moving up for appointments     potentially to assist in continuity there.  We also discussed about     stretching her Opana out a bit to avoid withdrawal type symptoms. 4. Ultimately, she will need further treatment of her MS to help with     some of these secondary symptoms.  She has had modest results with     most of the analgesic treatments provided through our office.    Ranelle Oyster, M.D. Electronically Signed   ZTS/MedQ D:  09/10/2010 13:58:36  T:  09/11/2010 00:43:01  Job #:  [id number]

## 2010-09-16 ENCOUNTER — Ambulatory Visit: Payer: Medicare Other | Admitting: Physical Medicine & Rehabilitation

## 2010-09-30 NOTE — Procedures (Signed)
[name], [name]                 ACCOUNT NO.:  [id number]   MEDICAL RECORD NO.:  [id number]           PATIENT TYPE:   LOCATION:                                 FACILITY:   PHYSICIAN:  Erick Colace, M.D.DATE OF BIRTH:  [birth year]   DATE OF PROCEDURE:  11/01/2008  DATE OF DISCHARGE:                               OPERATIVE REPORT   PROCEDURE:  Bilateral L5 dorsal ramus injection, bilateral L4 medial  branch block, bilateral L3 medial branch block under fluoroscopic  guidance.   INDICATIONS:  Lumbar pain with good relief following facet medial branch  blocks performed on August 23, 2008.   Pain is only partial response to medication management including  narcotic analgesics.   Informed consent was obtained after describing risks and benefits of the  procedure with the patient.  These include bleeding, bruising,  and  infection.  She elects to proceed and has given written consent.  The  patient placed prone on fluoroscopy table.  Betadine prep, sterile  drape, 25-gauge 1-1/2 inch needle was used to anesthetize the skin and  subcutaneous tissue with 1.5 lidocaine x2 mL at each of 6 sites.  Then,  a 22-gauge 5-inch spinal needle was inserted, first starting at left S1  SAP sacral junction.  Bone contact made, confirmed with lateral imaging.  Omnipaque 180 under live fluoro demonstrated no intravascular uptake.  Then, 0.5 mL of the dexamethasone lidocaine solution was injected.  This  solution consisted of 1 mL of 4 mg/mL dexamethasone and 2 mL of 2% MPF  lidocaine.  Then, the left L5 SAP transverse process junction, targeted  bone contact made.  Omnipaque 180 under live fluoro demonstrated no  intravascular uptake, and 0.5 mL of dexamethasone lidocaine solution was  injected.  The left L4 SAP transverse process junction, targeted bone  contact made.  Omnipaque 180 under live fluoro demonstrated no  intravascular uptake.  Then, 0.5 mL of dexamethasone lidocaine solution  was  injected.  The same procedure was repeated on the right side using  same needle injectate and technique.  The patient tolerated the  procedure well.  Pre and post injection vitals stable.  Post injection  instructions given.  Follow up Dr. Riley Kill next month.  Pre-injection  pain level 10/10 and post-injection pain level 8-9/10.      Erick Colace, M.D.  Electronically Signed     AEK/MEDQ  D:  11/01/2008 09:11:16  T:  11/01/2008 23:29:55  Job:  [id number]

## 2010-09-30 NOTE — Discharge Summary (Signed)
[name], [name]                 ACCOUNT NO.:  [id number]   MEDICAL RECORD NO.:  [id number]          PATIENT TYPE:  INP   LOCATION:  3030                         FACILITY:  [facility]   PHYSICIAN:  Acey Lav, MD  DATE OF BIRTH:  January 08, 1961   DATE OF ADMISSION:  10/23/2007  DATE OF DISCHARGE:  10/27/2007                               DISCHARGE SUMMARY   DISCHARGE DIAGNOSES:  1. Multiple sclerosis flare.  2. Overflow incontinence.  3. Paronychia with methicillin-resistant Staphylococcus aureus of the      right thumb.  4. Chronic pain.  5. Hypertension.  6. Status post fall.  7. Headache.  8. Hypoglycemia secondary to steroids.   DISCHARGE MEDICATIONS:  1. Bactrim DS 2 tablets p.o. b.i.d. x7 more days.  2. Fentanyl patch 25 mcg q.72 h.  3. Lotrel 5-20 mg, 2 tablets p.o. daily.  4. Ambien 10 mg p.o. q.h.s.  5. Lyrica 200 mg p.o. t.i.d.  6. Baclofen 10 mg p.o. t.i.d.  7. Rebif injections 3 times a week.  8. Percocet 5-325 1-2 tablets q.6 h. p.r.n. 120 tablets dispensed.  9. Prednisone taper pack, starting at 60 mg to taper over 3 weeks.  10.Flomax 0.4 mg p.o. q.h.s.   DISPOSITION AND FOLLOWUP:  The patient is to follow up with Dr. Sunnie Nielsen  in the Outpatient Clinic on November 08, 2007, at 2:30 p.m.  At that time,  her pain needs to be assessed along with her blood pressure.  The  patient is also going to follow up with Dr. Thad Ranger in [facility] in 1 month after discharge and Dr. Logan Bores with [facility]  in 2 to 4 weeks after discharge.   PROCEDURES PERFORMED:  At bedside, I&D was performed of the right thumb  with cultures positive for MRSA.  The patient tolerated the procedure  well.   CONSULTATIONS:  Neurology was consulted secondary to an MS flare and  Urology was consulted secondary to significant overflow incontinence.   ADMITTING HISTORY AND PHYSICAL:  The patient is a 50 year old [place]  American female who recently moved to the [place] area  approximately  1 week prior to admission.  She has a past medical history of MS  diagnosed about 6 months ago.  Over the last few days, she has had  significant difficulty with urinary retention and then incontinence.  She also notes worsening of her chronic back and lower extremity pain  and weakness.  She denies any dysuria, chest pain, shortness of breath,  and headache.   PHYSICAL EXAMINATION:  VITAL SIGNS:  Temp 98.0, blood pressure 129/85,  pulse 98, respiratory rate 20, O2 sat 99% on room air.  GENERAL:  She is alert, obese in no distress.  HEENT:  EYE:  Arcus senilis bilaterally.  Pupils equally round and  reactive to light.  Extraocular motions intact and anicteric.  ENT:  Dry  mucous membranes.  NECK:  Supple.  No lymphadenopathy.  RESPIRATORY:  Clear to auscultation bilaterally with a normal  respiratory effort.  CARDIOVASCULAR:  Regular rate and rhythm.  No murmurs, rubs, or gallops.  ABDOMINAL:  Obese, good bowel sounds, soft, tenderness to palpation  globally without rebound or guarding.  EXTREMITY:  No edema and 2+ dorsalis pedis pulses.  NEURO:  Cranial nerves 2 through 12 intact.  Decreased strength and  sensation in the bilateral lower extremities with strength approximately  3+.  Babinski abnormal.   ADMISSION LABS:  Sodium 136, potassium 3.6, chloride 108, bicarb 22, BUN  7, creatinine 0.7, glucose 103, anion gap 6, white blood cell count 4.7,  hemoglobin 14.4, platelet count 212, and MCV 89.1.  Urinalysis within  normal limits except for small leukocytes of 7 to 10 WBCs.  LFTs, total  bili 0.8, alkaline phosphatase 64, AST 40, ALT 33, total protein 6.7,  albumin 3.3, calcium 8.8, and lipase 20.  Urine culture, no growth and  wound culture abundant MRSA and repeat urine culture with 60,000  colonies of multiple bacterial morphologies.   DIAGNOSTIC IMAGING:  A portable chest x-ray, poor inspiratory films  without any stain obvious.  MRI of brain and C-spine and  T-spine showed:  1. Solitary subcentimeter white matter lesions without enhancement in      the right.  Centrum semiovale is nonspecific, but could reflect      sequela of demyelination, otherwise normal MRI of the brain.  2. Hyperintense lesion of the cervical spinal cord at the C2, C3, and      C4 level without enhancement, compatible demyelination.  3. Disk degeneration at C4-C5, C5-C6, and C6-C7.  4. Abnormal foci compatible with demyelination at T6 and possibly T7-      T8, otherwise, normal thoracic spinal cord.  5. CT of the head, negative for acute intracranial process.   HOSPITAL COURSE/PROBLEM:  1. Multiple sclerosis, flare.  The patient's pain and worsening of her      weakness and bladder difficulties were consistent with an multiple      sclerosis flare.  Neurology was consulted and recommended 500 mg of      Solu-Medrol IV daily x3 days and then prednisone taper over 3      weeks, which was started.  They also recommended MRI of the brain,      C-spine, and T-spine, which was done with results as above.  The      patient's home pain management regimen was changed, and she was      started on a fentanyl patch for long-acting medication and then      also had Percocet p.r.n.  She was also continued on Lyrica and      baclofen.  Her pain improved markedly during this hospitalization      on that regimen.  PT and OT was also consulted and worked with the      patient on numerous occasions, and she was able to get up and      ambulate without significant difficulty at the time of discharge.  2. Overflow incontinence.  Dr. Logan Bores of [facility] was consulted      and felt like the patient's history and physical was very      consistent with overflow incontinence.  She was started on Flomax      and taught how to in-and-out cath if needed.  3. Paronychia with methicillin-resistant Staphylococcus aureus.  The      patient noted significant right thumb pain the day after  admission      and was found to have paronychia on exam.  The area was incision      and drained and cultures  sent out.  It came back with abundant      methicillin-resistant Staphylococcus aureus.  The patient was      started on Bactrim and will complete a 10-day course.  The patient      had a significant amount of purulent material drained from the      right thumb and her pain was much improved at the time of      discharge.  4. Chronic pain.  The patient's home regimen was discontinued, and she      was started on a fentanyl patch with Percocet for breakthrough and      will continue her Lyrica and baclofen and had significant relief in      her symptoms.  5. Hypertension.  The patient's home blood pressure management was      continued and she had systolics in the 120s-150s range during this      hospitalization.  6. Status post fall.  The patient had an episode of incontinence while      walking and slipped on her urine and fell and hit her head and      elbow.  She continued to have significant headache after the event      and said she had some associated dizziness.  Her neuro exam was      completely unchanged due to the lingering headache without relief      from Percocet.  A CAT scan was done that was negative for anything      acute.  7. Headache.  The patient had a lot of stress and anxiety about her      diagnosis during this hospitalization and it was felt like her      headache was most consistent with a tension headache, and she was      pain-free at the time of discharge.  8. Hypoglycemia.  It was felt like the patient's hypoglycemia was      secondary to steroid use and this can be followed up in the      outpatient setting.   DISCHARGE LABS:  CBC:  White count 18.5, hemoglobin 15.2, platelet count  278, MCV 90.7, sodium 139, potassium 3.7, chloride 103, bicarb 28, BUN  13, creatinine 0.77, glucose 157, and calcium 93.   DISCHARGE VITALS:  Temp 97.3, blood  pressure 126/81, heart rate 83,  respiratory rate 20, and O2 sat 95% on room air.      Joaquin Courts, MD  Electronically Signed      Acey Lav, MD  Electronically Signed    VW/MEDQ  D:  10/28/2007  T:  10/29/2007  Job:  [id number]   cc:   Casimiro Needle L. Thad Ranger, M.D.  Jamison Neighbor, M.D.

## 2010-09-30 NOTE — Consult Note (Signed)
[name], [name]                 ACCOUNT NO.:  [id number]   MEDICAL RECORD NO.:  [id number]          PATIENT TYPE:  INP   LOCATION:  3030                         FACILITY:  [facility]   PHYSICIAN:  Casimiro Needle L. Reynolds, M.D.DATE OF BIRTH:  01/20/1961   DATE OF CONSULTATION:  10/23/2007  DATE OF DISCHARGE:                                 CONSULTATION   CHIEF COMPLAINT:  Lower extremity pain, urinary symptoms, and history of  multiple sclerosis.   HISTORY OF PRESENT ILLNESS:  This is the initial inpatient consultation  evaluation of this 50 year old woman with a past medical history which  includes primarily spinal multiple sclerosis, which was diagnosed by a  neurologist in [place] in December of last year.  She states that at  that time of her diagnosis, she was having problems with her legs and  feet with numbness, burning, pain, and tingling sensations.  She also  states that she has had MS effect in the left side of her body, as well  as her vision, but the main manifestation that she has had has been a  pain syndrome in her lower extremities.  She has had some bladder  spasm which had been treated with Ditropan.  She was living  independently for until very recently, when in the past week or so she  has moved in with her mother here in [place] and has to stay in this  area.  She states that about 3 days ago, she was having more difficulty  passing urine, to the point that she was having urgency but then was  only leaking out a little bit time at a time, and felt that she could  not get her flow of urine going.  Then this morning, she suddenly became  incontinent of a large volume, that she says has never happened to her  before.  In addition, over the last few days she has had increasing pain  in her lower extremities and then normal, involving the both legs, feet,  as well as the back and thighs and some extent the inner thighs.  Because of these symptoms, she comes in today for  emergency room  evaluation.  I discussed the situation with the emergency room physician  on call, and it was decided that since she was new to the area, she was  best served by being admitted, so that her MS exacerbation can be  treated on an inpatient basis and in the meantime various problems could  be addressed.   PAST MEDICAL HISTORY:  Remarkable for diagnosis of multiple sclerosis as  above, which was made about 6 months ago in [place].  She states at that  time, she had MRIs of the brain and spine, but has not had any since.  She is presently taking Rebif.  She had lot of problems with the ongoing  pain syndrome as noted above.  She says she takes oxycodone up to 30 mg  3-4 times a day for that, along with Lyrica 200 mg b.i.d.  She also has  some spasticity, for which she takes baclofen, and  some bladder spasms  for which she takes Ditropan.  She has trouble with insomnia for which  she takes Ambien.  The only records we have in here date back to some  numbness she had in her left arm back to 2001, which was felt to be due  to radiculopathy at the time.  She also reports a history of  hypertension, which has been managed in [place] as well.   SOCIAL HISTORY:  She is currently living with her mother as above, prior  to that lived alone in [place].   REVIEW OF SYSTEMS:  Remarkable for some blurry vision as well as  fatigue.  She also states the longer she talks, the more difficulty she  has with speech, but she does not have any frank chewing or swallowing  problems.  She complains of a dry cough recently.   MEDICATIONS:  1. Rebif 44 mcg subcu 3 times weekly (Tuesday, Thursday, and      Saturday).  2. Ambien 10 mg at bedtime.  3. Baclofen 10 mg t.i.d.  4. Ditropan 5 mg b.i.d.  5. Lotrel.  6. Lyrica 200 mg t.i.d.  7. Percocet p.r.n.   ALLERGIES:  MORPHINE and CODEINE.   PHYSICAL EXAMINATION:  VITAL SIGNS:  Temperature 98.0, blood pressure  129/85, pulse 98, respirations  20, O2 sat 99% room air.  GENERAL:  This is an obese but otherwise healthy-appearing woman supine  on hospital bed, in no distress.  HEAD:  Cranial normocephalic and atraumatic.  Oropharynx benign.  NECK:  Supple without carotid or supraclavicular bruits.  HEART:  Regular rate and rhythm without murmurs.  NEUROLOGIC:  Mental status:  She is awake and alert.  She is oriented to  time and place.  Recent and remote memory are intact.  Attention span,  concentration, and fund of knowledge were all appropriate.  Speech is  fluent and not dysarthric.  She occasionally makes paraphasic errors,  but for the most part her speech is appropriate to the situation.  Cranial nerves:  Pupils are equal and poorly reactive.  Funduscopic exam  is benign.  Visual fields are full to confrontation.  Hearing is intact  to conversational speech.  Extraocular movements are full without  nystagmus.  She has diminished pinprick sensation in the left side of  the face.  The right face and palate moved normally and symmetrically.  Motor:  Normal bulk and tone.  She has good strength in the upper  extremities.  Lower extremity is difficult to gauge due to some pain  related to giveaway, but she seems to have pretty good strength in her  lower extremities.  Sensation:  She reports diminished pinprick  sensation to the high calf in both feet, but double stimulation is  intact.  Coordination:  Finger-to-nose performed accurately.  Gait is  deferred.  Reflexes are diminished throughout.  Toes are poorly  responsive bilaterally.   LABORATORY DATA:  CBC and BMET are unremarkable.  Urinalysis reveals a  small amount of leukocyte esterase and a few white cells in the urine.  Urine culture yesterday is still pending.   IMPRESSION:  1. Presumed diagnosis of multiple sclerosis, primarily spinal, with      acute exacerbation causing lower extremity neuropathic pain and      urinary retention.  She is on Rebif for this.  2.  Pain syndrome secondary to above in suboptimal control on oxycodone      and Lyrica at present.   RECOMMENDATIONS:  I agree  with an admission for intravenous steroids,  mostly because she is new to the area and needs to have a lot of things  done and this will allow [place] things to happen more easily.  I would  suggest adding a fentanyl patch for pain control, and titrate to effect.  I will continue the Lyrica at the present dose.  Regarding the urinary  symptoms, she needs to stop the Ditropan.  I would leave the Foley out  and see how she can urinate every few hours.  If she goes 8 hours  without urinating, we will straight cath as needed and record volumes.  I will consider adding Flomax.  At some point, she may need a urology  evaluation.  It will be nice to get MRIs of the cervical and thoracic  spines as well as brain with and without contrast while she is here as  baseline studies.  We will continue the Rebif.  Treatment for MS  exacerbation on an inpatient basis would be Solu-Medrol 500 mg daily or  in divided doses for 3 days.  I will follow with you.  Thank you for the  consultation.      Michael L. Thad Ranger, M.D.  Electronically Signed     MLR/MEDQ  D:  10/23/2007  T:  10/23/2007  Job:  [id number]

## 2010-09-30 NOTE — Consult Note (Signed)
[name], [name]                 ACCOUNT NO.:  [id number]   MEDICAL RECORD NO.:  [id number]          PATIENT TYPE:  INP   LOCATION:  3012                         FACILITY:  [facility]   PHYSICIAN:  Deanna Artis. Hickling, M.D.DATE OF BIRTH:  Jan 04, 1961   DATE OF CONSULTATION:  11/05/2008  DATE OF DISCHARGE:                                 CONSULTATION   CHIEF COMPLAINT:  Evaluate for MS exacerbation.   HISTORY OF THE PRESENT CONDITION:  The patient is a 50 year old woman  with diagnosis of multiple sclerosis since December 2008 who has  definite lesions on the basis of MRI of her cervical spine at C2, C3-4,  and T6.  She has also a small lesion in the right frontal centrum  semiovale.  None of these are enhancing in nature and none were acute.  The patient was admitted at that time with exacerbation of her symptoms  and received intravenous Solu-Medrol.   The patient has had chronic low back pain with pseudoradicular pain  radiating down her legs.   Currently, she is complaining of a different problem, which seems to  occur distally and radiate proximally.  She has burning, tingling pain  in bottom of her feet, the dorsum her feet extending up her legs, above  the knee, and from the tip of her fingers up to her mid forearm.  These  symptoms were within a peripheral nerve distribution.  She has not had  loss of bowel or bladder control.  She has complained of intermittent  diplopia that is very hard to assess.  She has had marked fatigue and  feels that she is progressively weak, although from a formal examination  point-of-view, she is not weak.  I think she means that she gets  fatigued more easily.   The patient has had a productive cough of white sputum, but has not had  fever.  She has not had nausea, vomiting, or any other constitutional  sign or symptom of infection.  The patient uses a walker to get around  and is followed in [facility].   Other medical  problems include hypertension, which seems to be fairly  well controlled, esophageal reflux disease, history of tachycardia.   Review of systems is as noted above in 12-system review.   Her past medical history includes overflow urinary incontinence, which  led to her hospitalization a year ago.  She has a history of paronychia  with methicillin-resistant staph aureus.   Family history is remarkable for diabetes in mother.  No history of  multiple sclerosis or other neurological disorders.   SOCIAL HISTORY:  The patient is stable.  She smokes.  She denies use of  alcohol and caffeine.  She gets around with a walker and lives  independently.  She lives with her son and daughter-in-law.  She has not  been seen in our office since January.  She claims it is because of  financial reasons.   CURRENT MEDICATIONS:  1. Rebif 44 mcg/0.5 mL inject subcutaneously 3 times per week.  2. Duragesic patch 75 mcg per hour every  72 hours.  3. OxyContin 10 mg XR 1 tablet twice daily.  4. Lyrica 200 mg capsules one 3 times daily.  5. Flexeril 10 mg tablets one 3 times daily.  6. Imipramine 50 mg tablets 2 at bedtime.  7. Accupril 40 mg tablets 1 daily.  8. Furosemide 40 mg tablets 1 daily.  9. Nexium 40 mg once daily.  10.Ambien 5 mg at bedtime.   ALLERGIES:  MORPHINE and CODEINE.   PHYSICAL EXAMINATION:  GENERAL:  Pleasant woman in no acute distress.  VITAL SIGNS:  Temperature 98, blood pressure 144/97, resting pulse 98,  respirations 18, oxygen saturation 97% on room air.  EAR, NOSE, AND THROAT:  Unremarkable.  LUNGS:  Clear.  HEART:  No murmurs.  Pulses normal.  ABDOMEN:  Soft.  Bowel sounds normal.  Protuberant abdomen.  EXTREMITIES:  Tender to touch.  Slight edema.  NEUROLOGIC:  Awake, alert, pleasant.  No dysphasia, dyspraxia.  Cranial  Nerves:  Round reactive pupils.  No afferent pupillary defect.  Fundi  normal.  She has photophobia.  She has inconsistent responses and  complains of  diplopia.  I cannot localize it.  Symmetric facial  strength.  Hyperesthesia on the right face that splits in midline.  Motor Examination:  Normal strength in the arms and legs.  Good finger  apposition, sensory, stocking, and glove neuropathy with decreased  pinprick, cold vibration, proprioception, but good stereoagnosis.  She  has antalgic gait.  She is areflexic.  She has bilateral flexor plantar  responses.   IMPRESSION:  The patient has a history of multiple sclerosis, but did  not have active disease in June 2009.  She has chronic low back pain  that may be related to spondylosis.  She has acute onset of tingling in  her distal arms and legs that radiates proximally and has what appears  to me to be a stocking glove peripheral neuropathy.  She has subjective  diplopia with normalized findings.  I see no evidence of myelopathy in  this woman.  She has normal strength.  She has numbness in a peripheral  nerve distribution.  She is areflexic and has bilateral flexor plantar  responses.  She does not have incontinence or difficulty passing her  urine.   She is on polypharmacy with Lyrica, imipramine, fentanyl, and Percocet.  She wants another pain medicine.  I would consider gabapentin for her  neuropathy.  We can also consider MRI scan of the brain and cervical  spine without and with contrast to look at the activity of her disease,  but based on her examination alone and not her complaints, the heart  findings are scant to justify such a study.   I appreciate the opportunity to participate in her care.  If you have  questions that I could be of assistance, do not hesitate to contact me.  Dr. Thad Ranger is out of town, so I will continue to follow in his  absence.      Deanna Artis. Sharene Skeans, M.D.  Electronically Signed     WHH/MEDQ  D:  11/05/2008  T:  11/06/2008  Job:  [id number]   cc:   Mick Sell, MD

## 2010-09-30 NOTE — Consult Note (Signed)
[name], [name]                 ACCOUNT NO.:  [id number]   MEDICAL RECORD NO.:  [id number]          PATIENT TYPE:  INP   LOCATION:  3030                         FACILITY:  [facility]   PHYSICIAN:  Jamison Neighbor, M.D.  DATE OF BIRTH:  09/21/1960   DATE OF CONSULTATION:  10/26/2007  DATE OF DISCHARGE:                                 CONSULTATION   REASON FOR CONSULTATION:  Urinary incontinence.   HISTORY:  This 50 year old black female is known to have a multiple  sclerosis.  The patient was admitted to the hospital on October 23, 2007,  having a flare of her MS.  The patient is felt to have problems with  overflow incontinence.  She feels she is not emptying of bladder to  completion.   The records are a little bit incomplete, but right now what I can  determine from talking to the patient and viewing the records that she  was catheterized in the emergency room and filled up the bag.  There  has also apparently been one other in-and-out catheterization.  There is  mention in the notes from the neurologist that they felt that she was an  overflow incontinence and suggested urology evaluation.   The patient does give a good history for overflow incontinence.  She  says that she feels she could not get her bladder empty and she sits and  strains and tries to push the urine out and seems to just put up small  amounts then subsequently she will be moving and have a large loss of  urine.  This certainly does not sound like stress incontinence.  We do  not feel, however, that we can completely characterize incontinence with  that urodynamics and as I told the patient, this could not be done in  the hospital setting, but we require her to be seen and evaluated in our  office.   The patient's past medical history is remarkable for the diagnosis of  MS, which was made back in December 2008.  The patient does not have  other significant medical problems.  Previous surgery includes a  hysterectomy.   MEDICATIONS AT TIME OF ADMISSION:  1. Ambien 10 mg nightly.  2. Baclofen 10 mg t.i.d.  3. Ditropan 5 mg b.i.d., which should be discontinued.  4. Lotrel.  5. Lyrica 200 t.i.d.  6. Oxycodone.   The patient does have a smoking history of possibly one-third pack per  day.  She does not use alcohol.   FAMILY HISTORY:  Noncontributory.   The patient's review of systems is pertinent for the dry mouth which was  likely previously caused by the Ditropan.  I did explain to the patient  that sometimes the patients with MS can go back and forth with urinary  retention and urge-type incontinence.   On examination today, she is a well-developed, alert, and obese female,  in no acute distress.  Her abdomen is soft and nontender with no pallor,  mass, rebound or guarding, or flank mass or tenderness.  I did not do a  rectal examination as the patient  is on her way to have a CT scan having  had a fall last night.  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   RECOMMENDATIONS:  1. Start in-and-out catheterization every 4 hours.  2. Flomax 0.4 mg p.o. nightly.  3. Continue Bactrim.  4. Urine culture.   The patient may need one in-and-out catheterization at home.  I will  check with the patient in a day or two to see if this has helped with  her problem.      Jamison Neighbor, M.D.  Electronically Signed     RJE/MEDQ  D:  10/26/2007  T:  10/26/2007  Job:  [id number]   cc:   Joaquin Courts, MD

## 2010-09-30 NOTE — Assessment & Plan Note (Signed)
[name] is back regarding her MS and multiple pain complaints.  Her pain  today is 10/10.  She had 2 series of medial branch-blocks from Dr.  Wynn Banker.  She had 3-4 days of relief with the first set and 1-2 with  the second set.  However, the injections only provided 25% relieve in  her symptoms.  She has a lot of pain in her legs, particularly at  nighttime that keeps her up at night.  The fentanyl patch has been more  problematic at times with adhesion.  She uses the Percocet 5 for  breakthrough pain.  She still has no neurologist.  She uses her walker  for balance.  She has not been on her imipramine for a month as the  [facility] has not been able to acquire for her yet.   Pain is described as sharp, burning, tingling, aching.  Pain interferes  with general activity, relations with others, enjoyment of life on a  severe level.   REVIEW OF SYSTEMS:  Notable for bowel and bladder issues, particularly  urinary hesitancy.  She has some blurred vision, weight gain.  Other  pertinent positives are above and full 14-point review is in the written  health and history section of the chart.   SOCIAL HISTORY:  The patient is single, living with her son, still  smoking one pack of cigarettes every 3 days.   PHYSICAL EXAMINATION:  Blood pressure is 148/85, pulse is 102,  respiratory rate 20.  She is sating 95% on room air.  The patient is  pleasant, alert and oriented x3.  Affect is generally appropriate.  She  remains morbidly obese.  She uses a walker for support needs, extra time  to stand, and walks with a flexed posture still.  She has pain more so  with extension and flexion today.  Strength is 4/5 in the upper  extremities, right more than left.  Lower extremity strength is also  around 4/5 with extremities being sensitive to touch but not frankly  allodynic.  She has edema of both legs at trace to 1+.  Sensation is  decreased in the legs and upper extremities in a  stocking-glove  distribution.  Heart is tachycardiac.  Chest is clear.  Abdomen is soft,  nontender.  No open skin wounds are seen today.   ASSESSMENT:  1. Multiple sclerosis.  2. Morbid obesity.  3. Lumbar facet arthropathy.  4. Insomnia.  5. Anxiety with depression.  6. Questionable restless leg syndrome.   PLAN:  1. We will back off her fentanyl patch to 75 mcg q.72 h. and increase      her breakthrough oxycodone 15 mg q.6 h. p.r.n. #90.  My thought is      to covert her over to an OxyContin regimen with breakthrough      oxycodone perhaps.  She is not responding to the fentanyl obviously      and there may be adhesion issue as well.  2. Continue Lyrica.  Continue Topamax.  3. Begin trial of baclofen 10 mg at bedtime for spasm and restless leg      symptoms at night.  She may use q.6 h. p.r.n. during the day.  4. Hytrin 1 mg at bedtime for urinary hesitancy.  5. We will see her back in a month or two depending on appointment      availability.  She is to call with any questions.      Ranelle Oyster, M.D.  Electronically Signed     ZTS/MedQ  D:  11/23/2008 11:34:53  T:  11/24/2008 01:05:44  Job #:  [id number]   cc:   Casimiro Needle L. Thad Ranger, M.D.  Fax: [phone]   Buena Irish, MD

## 2010-09-30 NOTE — Discharge Summary (Signed)
[name], [name]                 ACCOUNT NO.:  [id number]   MEDICAL RECORD NO.:  [id number]          PATIENT TYPE:  INP   LOCATION:  3012                         FACILITY:  [facility]   PHYSICIAN:  Mick Sell, MD DATE OF BIRTH:  09-17-1960   DATE OF ADMISSION:  11/05/2008  DATE OF DISCHARGE:  11/06/2008                               DISCHARGE SUMMARY   DISCHARGE DIAGNOSES:  1. Peripheral neuropathy.  Will continue Lyrica upon discharge.  2. Weakness and fatigue - likely secondary to multiple pain medicines,      fentanyl, OxyContin, Ambien, pain regimen changed upon discharge;      see medication list.  3. Multiple sclerosis, diagnosis made in 2008, the patient on Rebif      receiving three times per week.  4. Overflow urinary incontinence.  5. History of paronychia with methicillin resistant Staph aureus.  6. Hypertension.  7. Gastroesophageal reflux disease.   MEDICATIONS:  1. Rebif 44 mcg/0.5 mL inject three times per week.  2. Lyrica 100 mg tablet three times daily.  3. Fentanyl patch 75 mcg every 72 hours.  4. Imipramine 50 mg take two tablets at bedtime.  5. Lasix 40 mg tablet once daily.  6. Accupril 40 mg tablet daily.  7. Flexeril 10 mg tablet once daily as needed for pain.  8. Nexium 40 mg tablet once daily.  9. Percocet 5/325 mg tablet twice daily as needed for pain.   Note:  Stop taking Ambien, stop taking nortriptyline, dose of Lyrica  decreased from 200 mg three times daily to 100 mg three times daily,  dose of fentanyl patch decreased from 100 mcg every 72 hours to 75 mcg  every 72 hours, Flexeril changed from t.i.d. dosing to as needed basis,  Percocet also changed to twice daily on as needed basis for pain.   DISPOSITION AND FOLLOWUP:  The patient was discharged from th hospital  in stable condition with pain slightly better controlled.  She will  follow up in Outpatient Clinic with Dr. Andrey Campanile on 07/14 at 2 p.m.  On  follow-up appointment, please  assess if the patient continues to  experience fatigue and weakness and if there is any improvement of  peripheral neuropathy.  Also note the medication changes during the  hospitalization.  So, please assess if the patient's pain is adequately  controlled.  In addition, may check BMET since during the admission, the  patient had mild hypokalemia which was repleted.   CONSULTATIONS:  Dr. Sharene Skeans.   PROCEDURES:  CXR November 05, 2008.  No active lung disease.  Mild  peribronchial thickening.   HISTORY OF PRESENT ILLNESS:  A 50 year old female with diagnosis of MS  since 2008, over flow incontinence, and hypertension presents to [facility] with main concern of progressive weakness and fatigue that had  been getting progressively worse over the past three weeks.  It started  about two months prior to admission, associated with continuous over  flow incontinence, occasionally but not consistently associated with  diplopia and lower extremity tingling, numbness, and pain.  Left arm  weakness  worse than right arm weakness.  In addition, she reports a  productive cough of white sputum.  Denies sick contacts and exposures,  no recent traveling.  No fever or chills.  No nausea or vomiting.  Uses  a walker to get around and is in general independent.  Lives with son  and daughter-in-law.  Has not seen a neurologist in over six months  since she could not afford to pay co pay.  Has had about five to six  episodes of MS flare since her diagnosis in 2008, reporting episodes  feel about similar as this one.   VITALS:  Temperature 98.2, pulse 98, blood pressure 123/88, respirations  20, saturating 97% on room air, BMI 50.   PHYSICAL EXAMINATION:  No acute distress.  Cooperative with examination.  HEENT:  PERRLA.  No injection.  Anicteric sclerae.  EOMI.  No diplopia.  Oropharynx pink and most.  No erythema.  No exudates.  No lesions or  nasal congestion.  NECK:  Supple.  Full range of motion.   No thyromegaly.  No JVD.  LUNGS:  Good air movement, normal breath sounds, no crackles or wheezes.  HEART:  Regular rate and rhythm.  No murmurs.  No JVD.  ABDOMEN:  Soft, nontender and nondistended.  Bowel sounds normal.  MUSCULOSKELETAL:  No joint swelling, no joint warmth, no redness of her  joints.  Pulses +2 bilaterally, dorsalis pedis and posterior tibial.  EXTREMITIES:  No cyanosis, clubbing or edema.  NEUROLOGIC:  Alert and oriented times three.  Cranial nerves II-XII  intact.  Strength normal in all extremities.  Sensation slightly  decreased on the left compared to right in lower extremities and upper  extremities as well.   LABORATORY DATA:  Sodium 138, potassium 3.2, chloride 103, bicarb 28,  glucose 176, BUN 7, creatinine 0.71, AST 48, albumin 3.0  WBC 8.3, hemoglobin 15, platelets 250, MCV 89.   HOSPITAL COURSE:  1. Weakness and fatigue - generalized, possibly related to multiple      pain medicines.  Medication regimen changed and dosages decreased      on a few medicines as noted above.  The patient will follow up in      the outpatient setting for further evaluation.  The patient was      advised not to take Ambien and to take Flexeril and Percocet only      on as needed basis.  Also, the patient was made aware that fentanyl      patch was decreased to 75 mcg every 72 hours since it was thought      that extensive pain medicines were making her sleepy and tired.   1. Lower extremity tingling and numbness, upper extremity, left more      than right tingling and numbness - secondary to peripheral      neuropathy.  Per neurology, unlikely MS flare.  Vitamin B12 level,      RPR, and HIV status pending on discharge.  This can be followed up      in outpatient setting.  Per neurology, recommendation was to      continue Neurontin.  Per primary team, decision was made to      continue only Lyrica since the patient has been on Lyrica for some      time.  However, the dosing  was decreased from 200 mg three times      daily to 100 mg three times daily.  The patient will follow up  in      the outpatient setting to evaluate if further changes in medication      regimen are necessary.  ESR within normal limits and TSH within      normal limits during the hospitalization.   1. Hypokalemia - repleted.   1. Hypertension, well controlled on Accupril and Lasix.  This will be      continued upon discharge.   1. Insomnia, likely secondary to polypharmacy.  We discussed with the      patient discontinuing Ambien and continue with the rest of the      medications as prescribed only.   VITALS ON DISCHARGE:  Temperature 98.3, blood pressure 111/75, pulse 98,  respirations 20, saturating 96% on room air.   LABS:  Sodium 139, potassium 4.3, chloride 104, bicarb 30, BUN 8,  creatinine 0.75, glucose 180.  TSH 1.234.  ESR 12.  WBC 6.4, hemoglobin  13.3, and platelets 224.   Over 30 minutes were spent on discharging the patient.      Mliss Sax, MD  Electronically Signed      Mick Sell, MD  Electronically Signed    IM/MEDQ  D:  11/06/2008  T:  11/06/2008  Job:  [id number]   cc:   Dr. Andrey Campanile

## 2010-09-30 NOTE — Assessment & Plan Note (Signed)
She is back regarding her MS and associated pain as well as low back  pain.  Her pain is at 10/10.  She was unable to get the methadone due to  financial constraints, and she did not take Percocet, as she is out of  these as well.  She is waiting on Topamax through the drug assistance  program and she will be getting this soon.  She did go on to the  imipramine and is taking 100 mg at bedtime.  I did receive her MRI from  [facility], and she has degenerative disk changes at L2-L3,  L4-L5 with multilevel facet arthropathy.  The patient's back pain  remained essential and usually bothers her when she stands or sits for  long periods of time.  Continues to have lot of stinging and burning  pain in her feet particularly at nighttime.  Sleep is poor as a result.  The patient rates her pain at 10/10 today.  Her Oswestry score is 56%.  Pain interferes with general activity, relations with others, enjoyment  of life on a severe level.   REVIEW OF SYSTEMS:  Notable for numbness, tremor, tingling, trouble  walking, spasms, weight gain, poor appetite, limb swelling, coughing.  Other pertinent positives are above and full review is in the written  health and history section of the chart.   SOCIAL HISTORY:  The patient is single, living with her son.  She is  still smoking 1 pack of cigarettes every 3 days.   PHYSICAL EXAMINATION:  VITAL SIGNS:  Blood pressure is 144/73, pulse is  104, respiratory rate 18, she is sating 99% on room air.  GENERAL:  The patient is generally pleasant, alert and oriented x3.  She  remains morbidly overweight.  She states her weight is around 268  pounds.  She uses a walker for gait.  She has a hard time standing  straight on exam and facet maneuvers and extension maneuvers were pain  provoking today.  She is able to bend a bit easier as a whole, although  she stands at baseline of 5 degrees plus of forward flexion.  Strength  generally in both legs is around  3+/5.  She is a bit weak on her left  once again.  EXTREMITIES:  Upper extremity strength is 4+/5.  She is hyperreflexic in  the extremities of 2+ to 3+.  Sensation is 1/2 in the legs today.  No  resting tone.  She had occasional tremors still on exam.  HEART:  Tachycardiac.  CHEST:  Clear.  ABDOMEN:  Soft, nontender.   ASSESSMENT:  1. Multiple sclerosis with associated pain syndrome.  2. Low back pain, most consistent with facet arthropathy.  3. Morbid obesity.   PLAN:  1. Increase imipramine to 150 mg nightly.  2. Begin Topamax trial as previously written as well.  3. We will stop methadone and begin Percocet 10 one q.6 h. p.r.n.  She      will be given 90 tonight.  4. Continue fentanyl patch 75 mcg q.72 h.  5. We will send her to Dr. Wynn Banker for bilateral medial branch      blocks at L3 through S1.  6. Encouraged exercise, stretching weight loss as appropriate as well      as smoking cessation.      Ranelle Oyster, M.D.  Electronically Signed     ZTS/MedQ  D:  08/06/2008 10:29:16  T:  08/07/2008 00:10:31  Job #:  [id number]   cc:  Michael L. Thad Ranger, M.D.  Fax: [phone]   Dr. Buena Irish

## 2010-09-30 NOTE — Assessment & Plan Note (Signed)
She lives back after her medial branch blocks.  She states that she had  substantial relief for about a week and half.  Pain then recurred.  New  branch blocks were done on April 8.  The levels block were L3-L4, L4-L5.  The patient is having recurring back pain as well as pain particularly  in the left arm, tingling and numbness in the hand.  She also has  tingling and burning in her feet.  She remains on Lyrica 200 mg t.i.d.  She is taking the Topamax 50 at bedtime and imipramine 150 mg nightly.  She had questions about increasing her Percocet and oxycodone 15 mg for  breakthrough pain.  She remains on a fentanyl patch 75 mcg.  She  complains of some lesions on her arm and legs which she is not sure of  the source.  Apparently, she has seen a dermatologist remotely.  Sleep  is poor still.   REVIEW OF SYSTEMS:  Notable for numbness, tingling, spasms, weight gain,  and swelling.  Other pertinent positives are above and full review is in  the written health and history section of the chart.   SOCIAL HISTORY:  The patient lives alone and single.  She is still  smoking 1 pack of cigarettes every 3 days.   PHYSICAL EXAMINATION:  VITAL SIGNS:  Blood pressure is 158/83, pulse is  101, respiratory rate 18, and she is sating 94% on room air.  GENERAL:  The patient is pleasant, a bit anxious in no acute stress.  She remains morbidly overweight.  MUSCULOSKELETAL:  She uses a walker for support.  She tends to walk with  somewhat of a flexed posture.  She had pain once again with more  extension than flexion as a whole.  She is able to bend to about 40-50  degrees with some pain.  Strength is 4+/5 in right upper extremity, 4-/5  in left upper extremity.  She remains hyperreflexic left more than  right.  Sensation remains decreased in both legs as well as both hands  in a stocking glove-type distribution.  Left lower extremity strength is  4/5 and right lower extremity strength is 4+/5.  HEART:   Tachycardic.  CHEST:  Clear.  ABDOMEN:  Soft, nontender.  SKIN:  She had some mild open sores on the arm and back today which  seemed to have crusted over to a certain extent, were non draining.   ASSESSMENT:  1. Multiple sclerosis with associated pain.  2. Left arm pain which appears to be related to her multiple sclerosis      as well.  I see no focal signs of neuropathy and the patient      certainly has hyperreflexia weakness throughout the left side.  3. Morbid obesity.  4. Lumbar facet arthropathy.  5. Insomnia.  6. Anxiety with depression.   PLAN:  1. We will increase her fentanyl patch to 100 mcg q.72 h.  2. In a week or so, I want her to increase Topamax to 100 mg nightly.      We will continue with Lyrica as well for now.  3. Percocet 5/325 for breakthrough pain 1 q.6 h. p.r.n. #90.  She is      unable to get anything else besides the 5 or the 15 mg at the      health department.  4. Maintain imipramine at current dosing.  5. Recommend Dermatology followup for skin wounds.  May need to try an  antihistamine to see if this is some type of allergy.  I am not      really sure at this point what these are.  6. We will see her back after injections with Dr. Wynn Banker.  If      successful, consider RF depending on duration of response.       Ranelle Oyster, M.D.  Electronically Signed     ZTS/MedQ  D:  09/19/2008 10:47:33  T:  09/20/2008 00:42:53  Job #:  [id number]   cc:   Casimiro Needle L. Thad Ranger, M.D.  Fax: [phone]   Buena Irish

## 2010-09-30 NOTE — Procedures (Signed)
NAMEZANAI, [name]                 ACCOUNT NO.:  [id number]   MEDICAL RECORD NO.:  [id number]          PATIENT TYPE:  REC   LOCATION:  TPC                          FACILITY:  [facility]   PHYSICIAN:  Erick Colace, M.D.DATE OF BIRTH:  02/16/1961   DATE OF PROCEDURE:  08/23/2008  DATE OF DISCHARGE:                               OPERATIVE REPORT   PROCEDURE:  Bilateral L5 dorsal ramus injection, bilateral L4 medial  branch block, bilateral L3 medial branch block under fluoroscopic  guidance.   INDICATION:  Lumbar pain only partially responsive to medication  management and other conservative care.   Pain does interfere with self-care mobility.   Informed consent was obtained after describing risks and benefits of the  procedure with the patient.  These include bleeding, bruising, and  infection.  She elects to proceed and has given written consent.  The  patient was placed prone on fluoroscopy table.  Betadine prep, sterile  drape.  A 25-gauge 1-1/2-inch needle was used to anesthetize skin and  subcu tissue 1% lidocaine x2 mL at each of 6 sites.  Then, a 22-gauge 5-  inch Quincke-type needle was inserted under fluoroscopic guidance  targeting the junction of left S1 SAP sacral ala.  AP, lateral, and  oblique imaging utilized.  Omnipaque 180 under live fluoro after bone  contact made was made demonstrated no intravascular uptake followed by  injection of 0.5 mL of solution containing 1 mL of 4 mg/mL  dexamethasone, 2 mL of 2% MPF lidocaine.  Then, the left L5 SAP  transverse junction targeted.  Bone contact made, confirmed with lateral  imaging.  Omnipaque 180 under live fluoro demonstrated no intravascular  uptake and 0.5 mL of dexamethasone and lidocaine solution injected.  The  left L4 SAP transverse junction targeted.  Bone contact made, confirmed  with lateral imaging.  Omnipaque 180 under live fluoro demonstrated no  intravascular uptake and 0.5 mL of dexamethasone and  lidocaine solution  was injected.  Same procedure was repeated on the right side using same  needle course, injectate, and technique.  Please note that at the L5  level, a 7-inch needle needed to be utilized due to the patient's girth.   The patient tolerated procedure well.  Pre- and post-injection vitals  stable.  Post-injection instructions given.  Pre-injection pain level  10/10.  We will monitor pain response and if improved by 50% or more, we  will do confirmatory set.      Erick Colace, M.D.  Electronically Signed     AEK/MEDQ  D:  08/23/2008 09:41:04  T:  08/23/2008 21:16:29  Job:  [id number]

## 2010-10-03 NOTE — Assessment & Plan Note (Signed)
She is back regarding her MS and pain syndrome.  She did a bit better  with the increase in her breakthrough oxycodone at 15 mg.  We decreased  her patch interestingly enough to 75 mcg with this improvement.  Pain  still can be 10/10, but has more frequently been 7-8/10.  Pain is sharp,  burning, tingling, and aching.  Pain interferes with general activity,  relations with others, enjoyment of life on a moderate-to-severe level.  Sleep is still poor at times.  The baclofen with Flexeril help spasms.  She remains on Topamax and Lyrica for neuropathic pain.   REVIEW OF SYSTEMS:  Notable for the above.  Full 14-point review is in  the written health and history section of the chart.  She does report  some decreased appetite.  She has some shortness of breath due to her  weight.   SOCIAL HISTORY:  The patient is living with her son.  She moved to a new  apartment complex, it has a pool where she is excited about that.   PHYSICAL EXAMINATION:  Blood pressure is 130/86, pulse is 103,  respiratory rate 18, she is sating 96% on room air.  The patient is  pleasant, alert, and oriented x3.  Affect is bright and appropriate.  Gait is stable with a walker.  She does walk slow and has some antalgia  on either side.  She remains morbidly obese.  Strength is 4/5 still in  the upper extremities, right slightly stronger than left.  Lower  extremity strength is also 4/5.  She has sensitive skin with touch.  Edema 1+ seen both lower extremities.  Sensory exam is 1/2 in both legs  to a lesser extent of  the arms.  Heart is tachycardic.  Chest is clear.  Abdomen is soft, nontender.   ASSESSMENT:  1. Multiple sclerosis.  2. Morbid obesity.  3. Lumbar facet arthropathy.  4. Insomnia.  5. Anxiety with depression.  6. Questionable restless leg syndrome.   PLAN:  1. We will convert her over to Opana ER 30 mg q.12 h. with oxycodone      15 mg 1 q.6 h. p.r.n. for breakthrough pain.  She now has Medicaid,      so she should be able to acquire this.  2. Continue Lyrica and Topamax at current doses.  3. Continue baclofen and Flexeril for restless leg symptoms.  4. Hytrin was added for urinary hesitancy, this seems to have helped.  5. Encourage exercise and dietary changes, which we will review today.  6. I will see her back in 3 months with 1 month nursing clinic      followup.       Ranelle Oyster, M.D.  Electronically Signed    ZTS/MedQ  D:  01/18/2009 10:13:00  T:  01/19/2009 03:40:17  Job #:  [id number]   cc:   Casimiro Needle L. Thad Ranger, M.D.  Fax: [phone]   Buena Irish

## 2011-02-12 LAB — POCT I-STAT, CHEM 8
Chloride: 108
Creatinine, Ser: 0.7
Glucose, Bld: 103 — ABNORMAL HIGH
HCT: 42
Potassium: 3.6
Sodium: 136

## 2011-02-12 LAB — WOUND CULTURE

## 2011-02-12 LAB — DIFFERENTIAL
Lymphocytes Relative: 33
Lymphs Abs: 1.5
Monocytes Relative: 13 — ABNORMAL HIGH
Neutro Abs: 2.3
Neutrophils Relative %: 50

## 2011-02-12 LAB — URINE CULTURE
Colony Count: NO GROWTH
Culture: NO GROWTH

## 2011-02-12 LAB — BASIC METABOLIC PANEL
BUN: 12
BUN: 13
BUN: 8
CO2: 22
CO2: 25
CO2: 28
Calcium: 9.6
Chloride: 103
Chloride: 105
Chloride: 111
Creatinine, Ser: 0.69
Creatinine, Ser: 0.77
GFR calc Af Amer: >60
GFR calc non Af Amer: >60
Glucose, Bld: 137 — ABNORMAL HIGH
Glucose, Bld: 157 — ABNORMAL HIGH
Potassium: 3.7
Potassium: 4.2
Sodium: 136

## 2011-02-12 LAB — CBC
HCT: 44.8
MCHC: 34
MCHC: 34
MCV: 90.6
MCV: 90.7
Platelets: 212
Platelets: 278
RBC: 4.69
RBC: 4.71
RDW: 12.8
WBC: 18.5 — ABNORMAL HIGH
WBC: 4.7

## 2011-02-12 LAB — URINALYSIS, ROUTINE W REFLEX MICROSCOPIC
Glucose, UA: NEGATIVE
Nitrite: NEGATIVE
Specific Gravity, Urine: 1.012
pH: 6.5

## 2011-02-12 LAB — COMPREHENSIVE METABOLIC PANEL
ALT: 33
Albumin: 3.3 — ABNORMAL LOW
Alkaline Phosphatase: 64
BUN: 6
Chloride: 103
Glucose, Bld: 143 — ABNORMAL HIGH
Potassium: 3.9
Sodium: 135
Total Bilirubin: 0.8

## 2011-02-12 LAB — URINE MICROSCOPIC-ADD ON

## 2011-04-15 ENCOUNTER — Encounter: Payer: Self-pay | Admitting: Cardiology

## 2012-02-13 ENCOUNTER — Emergency Department (HOSPITAL_COMMUNITY)
Admission: EM | Admit: 2012-02-13 | Discharge: 2012-02-13 | Disposition: A | Discharge status: Home / Self Care | Payer: Medicare (Managed Care) | Attending: Emergency Medicine | Admitting: Emergency Medicine

## 2012-02-13 ENCOUNTER — Encounter (HOSPITAL_COMMUNITY): Payer: Self-pay | Admitting: Emergency Medicine

## 2012-02-13 DIAGNOSIS — Z794 Long term (current) use of insulin: Secondary | ICD-10-CM | POA: Insufficient documentation

## 2012-02-13 DIAGNOSIS — E119 Type 2 diabetes mellitus without complications: Secondary | ICD-10-CM | POA: Insufficient documentation

## 2012-02-13 DIAGNOSIS — I10 Essential (primary) hypertension: Secondary | ICD-10-CM | POA: Insufficient documentation

## 2012-02-13 DIAGNOSIS — G8929 Other chronic pain: Secondary | ICD-10-CM

## 2012-02-13 DIAGNOSIS — F17200 Nicotine dependence, unspecified, uncomplicated: Secondary | ICD-10-CM | POA: Insufficient documentation

## 2012-02-13 DIAGNOSIS — Z885 Allergy status to narcotic agent status: Secondary | ICD-10-CM | POA: Insufficient documentation

## 2012-02-13 DIAGNOSIS — G35 Multiple sclerosis: Secondary | ICD-10-CM | POA: Insufficient documentation

## 2012-02-13 HISTORY — DX: Multiple sclerosis, unspecified: G35.D

## 2012-02-13 HISTORY — DX: Multiple sclerosis: G35

## 2012-02-13 HISTORY — DX: Essential (primary) hypertension: I10

## 2012-02-13 LAB — COMPREHENSIVE METABOLIC PANEL
AST: 17 U/L (ref 0–37)
Albumin: 3.2 g/dL — ABNORMAL LOW (ref 3.5–5.2)
Alkaline Phosphatase: 88 U/L (ref 39–117)
BUN: 12 mg/dL (ref 6–23)
CO2: 24 mEq/L (ref 19–32)
Chloride: 106 mEq/L (ref 96–112)
GFR calc non Af Amer: >90 mL/min (ref >90)
Potassium: 3.3 mEq/L — ABNORMAL LOW (ref 3.5–5.1)
Total Bilirubin: 0.6 mg/dL (ref 0.3–1.2)

## 2012-02-13 LAB — CBC
MCH: 29.3 pg (ref 26.0–34.0)
MCV: 82.7 fL (ref 78.0–100.0)
Platelets: 249 10*3/uL (ref 150–400)
RDW: 13.5 % (ref 11.5–15.5)

## 2012-02-13 MED ORDER — METHYLPREDNISOLONE 4 MG PO KIT: PACK | ORAL | Status: DC

## 2012-02-13 MED ORDER — METHYLPREDNISOLONE SODIUM SUCC 125 MG IJ SOLR
250.0000 mg | Freq: Once | INTRAMUSCULAR | Status: DC
Start: 2012-02-13 — End: 2012-02-13

## 2012-02-13 MED ORDER — METHYLPREDNISOLONE SODIUM SUCC 125 MG IJ SOLR
250.0000 mg | Freq: Once | INTRAMUSCULAR | Status: AC
  Administered 2012-02-13: 250 mg via INTRAMUSCULAR

## 2012-02-13 MED ORDER — MORPHINE SULFATE ER 30 MG PO TBCR: 30.0000 mg | EXTENDED_RELEASE_TABLET | Freq: Two times a day (BID) | ORAL | Status: DC

## 2012-02-13 MED ORDER — OXYCODONE-ACETAMINOPHEN 5-325 MG PO TABS
2.0000 | ORAL_TABLET | Freq: Once | ORAL | Status: AC
  Administered 2012-02-13: 2 via ORAL
  Filled 2012-02-13: qty 2

## 2012-02-13 MED ORDER — SODIUM CHLORIDE 0.9 % IV SOLN
500.0000 mg | Freq: Once | INTRAVENOUS | Status: DC
  Filled 2012-02-13: qty 4

## 2012-02-13 MED ORDER — METHYLPREDNISOLONE SODIUM SUCC 125 MG IJ SOLR
INTRAMUSCULAR | Status: AC
  Administered 2012-02-13: 250 mg via INTRAMUSCULAR
  Filled 2012-02-13: qty 8

## 2012-02-13 MED ORDER — SODIUM CHLORIDE 0.9 % IV SOLN: 500.0000 mg | Freq: Once | INTRAVENOUS | Status: DC

## 2012-02-13 NOTE — ED Notes (Signed)
Pt w/ hx of MS, now w/ bilateral leg weakness, blurred and double vision, burning sensation bottom of feet, burning, tingling and numbness in hands. States "in a lot of pain" from waist down.

## 2012-02-13 NOTE — ED Provider Notes (Addendum)
History     CSN: [id number]  Arrival date & time 02/13/12  4098   First MD Initiated Contact with Patient 02/13/12 [phone]      Chief Complaint  Patient presents with  . Multiple Sclerosis    (Consider location/radiation/quality/duration/timing/severity/associated sxs/prior treatment) HPI Pt with hx MS, and chronic pain syndrome, presents c/o diffuse body pain for the past week. States recently moved/returned in past week from [place] back to [place].  States has had some of her meds for pain (dilaudid), but states that she couldn't get her 'morphine' refilled in [place].  States it has been 7+ days since she had that medication. Denies nvd, no abd pain. No tremor or shakes. Pt also states feels her MS may be acting up. States at times has diffusely blurry vision both eyes, lasting seconds, and at other times has double vision, both eyes, again lasting several seconds, and not necessarily related to the blurry vision. No visual field cut or AF. No eye pain or redness. No headache. Denies change in vision. No problems w gait/walking, or coordination. No incontinence or urine retention. No dysuria or other gu c/o. Denies fever or chills. Pt states does get burning sensation from bottom of feet and palms of hands, stocking glove distribution, thawt burns and radiates upwards towards proximal extremities. Feels tingly/numb at times, but no constant/focal loss of sensation or weakness. Denies any steroid use. States had been compliant w her normal MS tx in [place], incl last tx approximately 1-2 weeks ago, but says rx for MS did not help.    Past Medical History  Diagnosis Date  . Multiple sclerosis exacerbation   . Hypertension   . Diabetes mellitus     Past Surgical History  Procedure Date  . Abdominal hysterectomy     partial  . Joint replacement     LTK    No family history on file.  History  Substance Use Topics  . Smoking status: Current Every Day Smoker -- 0.5 packs/day    Types:  Cigarettes  . Smokeless tobacco: Never Used  . Alcohol Use: No    OB History    Grav Para Term Preterm Abortions TAB SAB Ect Mult Living                  Review of Systems  Constitutional: Negative for fever and chills.  HENT: Negative for neck pain.   Eyes: Negative for pain and redness.  Respiratory: Negative for cough and shortness of breath.   Cardiovascular: Negative for chest pain.  Gastrointestinal: Negative for abdominal pain.  Genitourinary: Negative for dysuria, flank pain and difficulty urinating.  Musculoskeletal: Negative for gait problem.  Skin: Negative for rash.  Neurological: Negative for headaches.  Hematological: Does not bruise/bleed easily.  Psychiatric/Behavioral: Negative for confusion.    Allergies  Codeine and Morphine  Home Medications   Current Outpatient Rx  Name Route Sig Dispense Refill  . INSULIN GLARGINE 100 UNIT/ML [place] SOLN  Inject 17 units subcutaneously every night 10 mL 3    BP 175/99  Pulse 80  Temp 98.5 F (36.9 C) (Oral)  Resp 20  SpO2 96%  Physical Exam  Nursing note and vitals reviewed. Constitutional: She is oriented to person, place, and time. She appears well-developed and well-nourished. No distress.  HENT:  Mouth/Throat: Oropharynx is clear and moist.  Eyes: Conjunctivae normal and EOM are normal. Pupils are equal, round, and reactive to light. No scleral icterus.       Fundi, no papilledema,  no hemorrhage   Neck: Neck supple. No tracheal deviation present.  Cardiovascular: Normal rate, regular rhythm, normal heart sounds and intact distal pulses.   Pulmonary/Chest: Effort normal and breath sounds normal. No respiratory distress.  Abdominal: Soft. Normal appearance and bowel sounds are normal. She exhibits no distension. There is no tenderness.  Genitourinary:       No cva tenderness  Musculoskeletal: She exhibits no edema and no tenderness.       Spine nt.   Neurological: She is alert and oriented to person,  place, and time. No cranial nerve deficit.       Motor intact bil, strength 5/5, sens grossly intact. No pronator drift. Slow, steady gait.   Skin: Skin is warm and dry. No rash noted.  Psychiatric: She has a normal mood and affect.    ED Course  Procedures (including critical care time)  Results for orders placed during the hospital encounter of 02/13/12  COMPREHENSIVE METABOLIC PANEL      Component Value Range   Sodium 139  135 - 145 mEq/L   Potassium 3.3 (*) 3.5 - 5.1 mEq/L   Chloride 106  96 - 112 mEq/L   CO2 24  19 - 32 mEq/L   Glucose, Bld 117 (*) 70 - 99 mg/dL   BUN 12  6 - 23 mg/dL   Creatinine, Ser 4.09  0.50 - 1.10 mg/dL   Calcium 9.1  8.4 - 81.1 mg/dL   Total Protein 6.2  6.0 - 8.3 g/dL   Albumin 3.2 (*) 3.5 - 5.2 g/dL   AST 17  0 - 37 U/L   ALT 15  0 - 35 U/L   Alkaline Phosphatase 88  39 - 117 U/L   Total Bilirubin 0.6  0.3 - 1.2 mg/dL   GFR calc non Af Amer >90  >90 mL/min   GFR calc Af Amer >90  >90 mL/min  CBC      Component Value Range   WBC 9.2  4.0 - 10.5 K/uL   RBC 4.44  3.87 - 5.11 MIL/uL   Hemoglobin 13.0  12.0 - 15.0 g/dL   HCT 91.4  78.2 - 95.6 %   MCV 82.7  78.0 - 100.0 fL   MCH 29.3  26.0 - 34.0 pg   MCHC 35.4  30.0 - 36.0 g/dL   RDW 21.3  08.6 - 57.8 %   Platelets 249  150 - 400 K/uL       MDM   Labs.  Reviewed prior charts including note from Neurology, Dr Sharene Skeans, from eval from a couple years prior at which time pt presented w very similar symptoms.  He felt symptoms at that time did not represent acute MS flare, and f/u MRIs done at that time showed no acute process.   Given atypical symptoms and no acute exam findings today, will refer to close neurology follow up.    kcl po.  Discussed labs and need for close pcp and neuro follow up with pt. Pt states is out of her mscontin, will give small quantity rx until can f/u pcp/neuro/pain md (pt verified she is not allergic to ms contin, and has been taking on regular basis for many  months.   Pt requests we speak w neurologist on call.  Neurology on call paged, discussed pt with Dr Eilleen Kempf. He indicates not clear/definite MS exacerbation, but states to rx w single dose iv solumedrol in ed, 500 mg, and d/c home on medrol dose pack, he will leave  message w office to f/u Monday. Pt agreeable w plan.      Suzi Roots, MD 02/13/12 1006  Suzi Roots, MD 02/13/12 1020  Suzi Roots, MD 02/13/12 [phone]

## 2012-02-13 NOTE — ED Notes (Signed)
Pt states she does not believe she should be discharged home because she has blurred vision.  Pt requesting prednisone as treatment and states her insurance is still in [facility] and will not be transferred here for several weeks.  Pt states as such, she cannot follow-up with Dr. Anne Hahn.  Pt states she does not want to lose her vision and if she goes home and loses her vision, "I will blame y'all."  Pt informed that follow-up with Dr. Anne Hahn or another neurologist for MS and the pain rehab center for pain, would be the best long-term treatment plan.  Dr. Denton Lank informed of pt's concerns.

## 2012-02-24 ENCOUNTER — Ambulatory Visit (INDEPENDENT_AMBULATORY_CARE_PROVIDER_SITE_OTHER): Payer: Medicare Other | Admitting: Family Medicine

## 2012-02-24 VITALS — BP 110/74 | HR 75 | Temp 97.9°F | Resp 26 | Ht 63.5 in | Wt 290.2 lb

## 2012-02-24 DIAGNOSIS — J309 Allergic rhinitis, unspecified: Secondary | ICD-10-CM

## 2012-02-24 DIAGNOSIS — I10 Essential (primary) hypertension: Secondary | ICD-10-CM

## 2012-02-24 DIAGNOSIS — E119 Type 2 diabetes mellitus without complications: Secondary | ICD-10-CM

## 2012-02-24 DIAGNOSIS — F32A Depression, unspecified: Secondary | ICD-10-CM

## 2012-02-24 DIAGNOSIS — R197 Diarrhea, unspecified: Secondary | ICD-10-CM

## 2012-02-24 DIAGNOSIS — L293 Anogenital pruritus, unspecified: Secondary | ICD-10-CM

## 2012-02-24 DIAGNOSIS — G35 Multiple sclerosis: Secondary | ICD-10-CM

## 2012-02-24 DIAGNOSIS — E7800 Pure hypercholesterolemia, unspecified: Secondary | ICD-10-CM

## 2012-02-24 DIAGNOSIS — H0520 Unspecified exophthalmos: Secondary | ICD-10-CM

## 2012-02-24 DIAGNOSIS — E876 Hypokalemia: Secondary | ICD-10-CM

## 2012-02-24 DIAGNOSIS — N898 Other specified noninflammatory disorders of vagina: Secondary | ICD-10-CM

## 2012-02-24 DIAGNOSIS — R200 Anesthesia of skin: Secondary | ICD-10-CM

## 2012-02-24 DIAGNOSIS — F411 Generalized anxiety disorder: Secondary | ICD-10-CM

## 2012-02-24 DIAGNOSIS — K219 Gastro-esophageal reflux disease without esophagitis: Secondary | ICD-10-CM

## 2012-02-24 DIAGNOSIS — F419 Anxiety disorder, unspecified: Secondary | ICD-10-CM

## 2012-02-24 DIAGNOSIS — R350 Frequency of micturition: Secondary | ICD-10-CM

## 2012-02-24 DIAGNOSIS — R531 Weakness: Secondary | ICD-10-CM

## 2012-02-24 DIAGNOSIS — F329 Major depressive disorder, single episode, unspecified: Secondary | ICD-10-CM

## 2012-02-24 LAB — BASIC METABOLIC PANEL
BUN: 11 mg/dL (ref 6–23)
Chloride: 107 mEq/L (ref 96–112)
Glucose, Bld: 96 mg/dL (ref 70–99)
Potassium: 4.1 mEq/L (ref 3.5–5.3)

## 2012-02-24 LAB — POCT URINALYSIS DIPSTICK
Protein, UA: NEGATIVE
Urobilinogen, UA: 0.2

## 2012-02-24 LAB — POCT WET PREP WITH KOH

## 2012-02-24 LAB — POCT UA - MICROSCOPIC ONLY: Yeast, UA: NEGATIVE

## 2012-02-24 LAB — POCT GLYCOSYLATED HEMOGLOBIN (HGB A1C): Hemoglobin A1C: 5.2

## 2012-02-24 MED ORDER — DIPHENOXYLATE-ATROPINE 2.5-0.025 MG PO TABS: 1.0000 | ORAL_TABLET | Freq: Two times a day (BID) | ORAL | Status: DC

## 2012-02-24 MED ORDER — TOPIRAMATE 100 MG PO TABS: 100.0000 mg | ORAL_TABLET | Freq: Every day | ORAL | Status: DC

## 2012-02-24 MED ORDER — VENLAFAXINE HCL ER 37.5 MG PO CP24: 37.5000 mg | ORAL_CAPSULE | Freq: Every day | ORAL | Status: DC

## 2012-02-24 MED ORDER — GABAPENTIN 800 MG PO TABS: 800.0000 mg | ORAL_TABLET | Freq: Three times a day (TID) | ORAL | Status: DC

## 2012-02-24 MED ORDER — TAMSULOSIN HCL 0.4 MG PO CAPS: 0.4000 mg | ORAL_CAPSULE | Freq: Every day | ORAL | Status: DC

## 2012-02-24 MED ORDER — METOPROLOL TARTRATE 50 MG PO TABS: 50.0000 mg | ORAL_TABLET | Freq: Two times a day (BID) | ORAL | Status: DC

## 2012-02-24 MED ORDER — LORAZEPAM 2 MG PO TABS: 2.0000 mg | ORAL_TABLET | Freq: Every evening | ORAL | Status: DC | PRN

## 2012-02-24 MED ORDER — SUCRALFATE 1 G PO TABS: 1.0000 g | ORAL_TABLET | Freq: Two times a day (BID) | ORAL | Status: DC

## 2012-02-24 MED ORDER — HYDRALAZINE HCL 100 MG PO TABS: 100.0000 mg | ORAL_TABLET | Freq: Three times a day (TID) | ORAL | Status: DC

## 2012-02-24 MED ORDER — OMEPRAZOLE 20 MG PO CPDR: 20.0000 mg | DELAYED_RELEASE_CAPSULE | Freq: Every day | ORAL | Status: DC

## 2012-02-24 MED ORDER — FLUTICASONE PROPIONATE 50 MCG/ACT NA SUSP
2.0000 | Freq: Every day | NASAL | Status: AC
Start: 2012-02-24 — End: ?

## 2012-02-24 MED ORDER — LOSARTAN POTASSIUM 100 MG PO TABS: 100.0000 mg | ORAL_TABLET | Freq: Every day | ORAL | Status: DC

## 2012-02-24 MED ORDER — ATORVASTATIN CALCIUM 80 MG PO TABS
80.0000 mg | ORAL_TABLET | Freq: Every day | ORAL | Status: AC
Start: 2012-02-24 — End: ?

## 2012-02-24 MED ORDER — CLONIDINE HCL 0.2 MG PO TABS: 0.2000 mg | ORAL_TABLET | Freq: Two times a day (BID) | ORAL | Status: DC

## 2012-02-24 MED ORDER — NIFEDIPINE ER OSMOTIC RELEASE 30 MG PO TB24: 30.0000 mg | ORAL_TABLET | Freq: Every day | ORAL | Status: DC

## 2012-02-24 MED ORDER — MORPHINE SULFATE ER 30 MG PO TBCR: 30.0000 mg | EXTENDED_RELEASE_TABLET | Freq: Two times a day (BID) | ORAL | Status: DC

## 2012-02-24 MED ORDER — TIZANIDINE HCL 4 MG PO TABS: 4.0000 mg | ORAL_TABLET | Freq: Three times a day (TID) | ORAL | Status: DC | PRN

## 2012-02-24 MED ORDER — ALPRAZOLAM 1 MG PO TABS: 1.0000 mg | ORAL_TABLET | Freq: Two times a day (BID) | ORAL | Status: DC | PRN

## 2012-02-24 MED ORDER — NITROFURANTOIN MONOHYD MACRO 100 MG PO CAPS: 100.0000 mg | ORAL_CAPSULE | Freq: Two times a day (BID) | ORAL | Status: DC

## 2012-02-24 NOTE — Progress Notes (Signed)
[facility] [address] [place] [address] [phone]  Date:  02/24/2012   Name:  [name]   DOB:  24-Dec-1960   MRN:  [id number]  PCP:  No primary provider on file.    Chief Complaint: Multiple Sclerosis, Numbness, Extremity Weakness and Urinary Frequency   History of Present Illness:  [name] is a 51 y.o. very pleasant female patient who presents with the following:  Here to re- establish care.  She was last seen here about 18 months ago.  [name] has a long and complicated medical history.  She had moved to [place] but moved back to [place] about a month ago.  She has a history of MS.  She had a relapse a couple of weeks ago- was seen at the ED on 02/13/12 and treated with steroids.   She is using a cane.  Her neurologist was at [facility].  She will be seeing Dr. Anne Hahn- she does not have an appt scheduled, but she has a call in to them.    She uses Tysabri infusions once a month for her MS, but she has not established with a doctor to do these for her here yet.  She also notes tingling and weakness in her left hand- she had a nerve conduction study done in [place] for this problem in September. She states that she was told that she has carpal tunnel.   However, she has not yet been fitted with wrist braces.  She sometimes feels will drop objects, and the hand can burn, tingle and "go completely numb."  She notes weakness from the waist down, "I hardly sleep, my appetite is gone."  It is difficult to determine exactly how long she has noted this numbness and weakness.  She will sometimes state it has been a week, then sometimes longer.    She also notes blurred and "double vision" for some weeks.    She does not take any medications for her DM now because "I lost the weight and I did the diet."  Her glucose is "always good" when she checks it at home.   Manjot also notes that she has urinary frequncy and urgency.  She does seem to have polyuria.  She does note that  her vaginal area seems "kind of raw."    She has been back in [place] for about a month.  She has not yet established with pain management.  She uses both MS contin and Dilautid.  She has been out of dilaudid for about 2 days. She states the she just uses this medications as needed and does not think that she is in danger of over- sedation.  She needs refills of all of her medications today.    Patient Active Problem List  Diagnosis  . DIABETES MELLITUS  . MORBID OBESITY  . OTHER CHRONIC PAIN  . MULTIPLE SCLEROSIS  . BLURRED VISION  . HYPERTENSION  . ESOPHAGEAL REFLUX  . ACUTE CYSTITIS  . DEGENERATION INTERVERTEBRAL DISC SITE UNSPEC  . INSOMNIA  . SKIN RASH  . TACHYCARDIA  . URINARY RETENTION  . LIVER FUNCTION TESTS, ABNORMAL, HX OF  . SMOKER  . SIMPLE CHRONIC BRONCHITIS  . DYSPNEA    Past Medical History  Diagnosis Date  . Multiple sclerosis exacerbation   . Hypertension   . Diabetes mellitus     Past Surgical History  Procedure Date  . Abdominal hysterectomy     partial  . Joint replacement  LTK    History  Substance Use Topics  . Smoking status: Current Every Day Smoker -- 0.5 packs/day    Types: Cigarettes  . Smokeless tobacco: Never Used  . Alcohol Use: No    No family history on file.  Allergies  Allergen Reactions  . Codeine     REACTION: NAUSEA  . Morphine     REACTION: HALLUCINATIONS    Medication list has been reviewed and updated.  Current Outpatient Prescriptions on File Prior to Visit  Medication Sig Dispense Refill  . ALPRAZolam (XANAX) 1 MG tablet Take 1 mg by mouth 2 (two) times daily as needed. For anxiety.      Marland Kitchen atorvastatin (LIPITOR) 80 MG tablet Take 80 mg by mouth daily.      . cloNIDine (CATAPRES) 0.2 MG tablet Take 0.2 mg by mouth 2 (two) times daily.       . DULoxetine (CYMBALTA) 30 MG capsule Take 30 mg by mouth daily.      . fluticasone (FLONASE) 50 MCG/ACT nasal spray Place 2 sprays into the nose daily.      Marland Kitchen gabapentin  (NEURONTIN) 800 MG tablet Take 800 mg by mouth 3 (three) times daily.      . hydrALAZINE (APRESOLINE) 100 MG tablet Take 100 mg by mouth 3 (three) times daily.      Marland Kitchen HYDROmorphone (DILAUDID) 8 MG tablet Take 8 mg by mouth every 4 (four) hours as needed. For pain.      Marland Kitchen LORazepam (ATIVAN) 2 MG tablet Take 2 mg by mouth at bedtime as needed. For sleep.      Marland Kitchen losartan (COZAAR) 100 MG tablet Take 100 mg by mouth daily.      . metoprolol (LOPRESSOR) 50 MG tablet Take 50 mg by mouth 2 (two) times daily.      Marland Kitchen morphine (MS CONTIN) 30 MG 12 hr tablet Take 30 mg by mouth. 2 in am 1 in pm      . morphine (MS CONTIN) 30 MG 12 hr tablet Take 1 tablet (30 mg total) by mouth 2 (two) times daily.  30 tablet  0  . NIFEdipine (PROCARDIA-XL/ADALAT-CC/NIFEDICAL-XL) 30 MG 24 hr tablet Take 30 mg by mouth daily.      Marland Kitchen omeprazole (PRILOSEC) 20 MG capsule Take 20 mg by mouth daily.      . sucralfate (CARAFATE) 1 G tablet Take 1 g by mouth 2 (two) times daily.      . Tamsulosin HCl (FLOMAX) 0.4 MG CAPS Take 0.4 mg by mouth daily.      Marland Kitchen tiZANidine (ZANAFLEX) 4 MG tablet Take 4 mg by mouth every 8 (eight) hours as needed. For muscle spasm.      . topiramate (TOPAMAX) 100 MG tablet Take 100 mg by mouth daily.      Marland Kitchen venlafaxine XR (EFFEXOR-XR) 37.5 MG 24 hr capsule Take 37.5 mg by mouth daily.      Marland Kitchen zolpidem (AMBIEN) 10 MG tablet Take 10 mg by mouth at bedtime as needed. For sleep.      . diphenoxylate-atropine (LOMOTIL) 2.5-0.025 MG per tablet Take 1 tablet by mouth 2 (two) times daily. For diarrhea.      . methylPREDNISolone (MEDROL DOSEPAK) 4 MG tablet Take as directed  21 tablet  0    Review of Systems:  As per HPI- otherwise negative.   Physical Examination: Filed Vitals:   02/24/12 0818  BP: 110/74  Pulse: 75  Temp: 97.9 F (36.6 C)  Resp: 26  Filed Vitals:   02/24/12 0818  Height: 5' 3.5" (1.613 m)  Weight: 290 lb 3.2 oz (131.634 kg)   Body mass index is 50.60 kg/(m^2). Ideal Body Weight:  Weight in (lb) to have BMI = 25: 143.1   GEN: WDWN, NAD, Non-toxic, A & O x 3, morbid obesity HEENT: Atraumatic, Normocephalic. Neck supple. No masses, No LAD. Bilateral TM wnl, oropharynx normal.  PEERL,EOMI.   Seems to have some proptosis.  Normal facial movement Ears and Nose: No external deformity. CV: RRR, No M/G/R. No JVD. No thrill. No extra heart sounds. PULM: CTA B, no wheezes, crackles, rhonchi. No retractions. No resp. distress. No accessory muscle use. ABD: S, NT, ND, +BS. No rebound. No HSM. GU: no external abnormality or obvious discharge.  Performed WP EXTR: No c/c/e NEURO she is using a cane currently.  She states that she does not usually need a cane- usually she is able to walk on her own.  She has 3/5 strength in her left hand/ arm and leg, and 4/5 strength in her right side.  Reflexes are decreased but symmetrical bilaterally.  She also states that she has decreased sensation on her left side.   PSYCH: Normally interactive. Conversant. Not depressed or anxious appearing.  Calm demeanor.   Results for orders placed in visit on 02/24/12  POCT URINALYSIS DIPSTICK      Component Value Range   Color, UA yellow     Clarity, UA hazy     Glucose, UA neg     Bilirubin, UA neg     Ketones, UA neg     Spec Grav, UA 1.010     Blood, UA neg     pH, UA 6.0     Protein, UA neg     Urobilinogen, UA 0.2     Nitrite, UA neg     Leukocytes, UA small (1+)    POCT UA - MICROSCOPIC ONLY      Component Value Range   WBC, Ur, HPF, POC 8-12     RBC, urine, microscopic 1-3     Bacteria, U Microscopic 1+     Mucus, UA neg     Epithelial cells, urine per micros 3-6     Crystals, Ur, HPF, POC neg     Casts, Ur, LPF, POC neg     Yeast, UA neg    POCT GLYCOSYLATED HEMOGLOBIN (HGB A1C)      Component Value Range   Hemoglobin A1C 5.2    POCT WET PREP WITH KOH      Component Value Range   Trichomonas, UA Negative     Clue Cells Wet Prep HPF POC 1-3     Epithelial Wet Prep HPF POC 2-4      Yeast Wet Prep HPF POC postive     Bacteria Wet Prep HPF POC 1+     RBC Wet Prep HPF POC 0-2     WBC Wet Prep HPF POC 8-12     KOH Prep POC Positive     She is not taking cymbalta- has not taken it in 30 days.  We will D/C this from her med list.  She also states that she has been out of dilaudid for a couple of days  Assessment and Plan: 1. Urinary frequency  POCT urinalysis dipstick, POCT UA - Microscopic Only, Basic metabolic panel, Urine culture, Tamsulosin HCl (FLOMAX) 0.4 MG CAPS, nitrofurantoin, macrocrystal-monohydrate, (MACROBID) 100 MG capsule  2. Vaginal itching  POCT Wet Prep with KOH  3. Diabetes mellitus, type 2  POCT glycosylated hemoglobin (Hb A1C), Basic metabolic panel  4. Hypokalemia  Basic metabolic panel  5. Proptosis  TSH  6. Anxiety  ALPRAZolam (XANAX) 1 MG tablet, LORazepam (ATIVAN) 2 MG tablet  7. High cholesterol  atorvastatin (LIPITOR) 80 MG tablet  8. Hypertension  cloNIDine (CATAPRES) 0.2 MG tablet, hydrALAZINE (APRESOLINE) 100 MG tablet, losartan (COZAAR) 100 MG tablet, metoprolol (LOPRESSOR) 50 MG tablet, NIFEdipine (PROCARDIA-XL/ADALAT-CC/NIFEDICAL-XL) 30 MG 24 hr tablet  9. Diarrhea  diphenoxylate-atropine (LOMOTIL) 2.5-0.025 MG per tablet  10. Allergic rhinitis  fluticasone (FLONASE) 50 MCG/ACT nasal spray  11. MS (multiple sclerosis)  gabapentin (NEURONTIN) 800 MG tablet, morphine (MS CONTIN) 30 MG 12 hr tablet, tiZANidine (ZANAFLEX) 4 MG tablet, topiramate (TOPAMAX) 100 MG tablet  12. GERD (gastroesophageal reflux disease)  omeprazole (PRILOSEC) 20 MG capsule, sucralfate (CARAFATE) 1 G tablet  13. Depression  venlafaxine XR (EFFEXOR-XR) 37.5 MG 24 hr capsule   Possible UTI.  Await urine culture, but will start on macrobid today.   Dylynn also has a vaginal yeast infection.  Recommended that she try an OTC yeast treatment such as monistat as she is on so many medications that could interact with diflucan.  A1c is indeed very good today.  No  medications indicated  Reviewed labs from ED on 9/28.  Will check a BMP to follow- up low potasium.    I declined to write for dilaudid today.   I did refill her MS contin for a dosage of 30 mg twice a day for one month.  She is on several sedating medications and I feel that adding dilaudid is possibly dangerous and beyond my scope of practice.  Encouraged her to establish with pain management as soon as possible and gave her some resources for local pain practices.    BP is controlled today.  Would like to try and stop some of her BP medications in the near future.    Numbness and weakness; recommended that we do an MRI today as we need to evaluate for any MS related changes. Also consider stroke.  CVA is less likely given his history of MS, and as her symptoms are at least a week old this is not emergent.  However, Kathia declined to have an MRI today- in fact she does not wish to have an MRI at all but did agree to let me order a CT of her head for tomorrow.  Also gave her a wrist splint for possible CTS in her left wrist.  We called [facility] to check on the status of her appt.  It seems that she may have a balance there that needs to be paid.  Porschea was not aware of this but she plans to call and see what needs to be done asap.    Polypharmacy: tried to go over her medications today in hopes of stopping some.  She had a list with her but not the actual bottles, and she is not sure why she is taking several of these medications.  Could not identify any medications which she did not see as essential today, but we will make it a goal to cut down on her number of medications.  Refilled all today as below  Meds ordered this encounter  Medications  . Natalizumab (TYSABRI IV)    Sig: Inject 300 mg into the vein every 30 (thirty) days.  . ALPRAZolam (XANAX) 1 MG tablet    Sig: Take 1 tablet (1 mg total) by mouth 2 (  two) times daily as needed. For anxiety.    Dispense:  30 tablet    Refill:  2  .  atorvastatin (LIPITOR) 80 MG tablet    Sig: Take 1 tablet (80 mg total) by mouth daily.    Dispense:  90 tablet    Refill:  3  . cloNIDine (CATAPRES) 0.2 MG tablet    Sig: Take 1 tablet (0.2 mg total) by mouth 2 (two) times daily.    Dispense:  180 tablet    Refill:  3  . diphenoxylate-atropine (LOMOTIL) 2.5-0.025 MG per tablet    Sig: Take 1 tablet by mouth 2 (two) times daily. For diarrhea.    Dispense:  60 tablet    Refill:  3  . fluticasone (FLONASE) 50 MCG/ACT nasal spray    Sig: Place 2 sprays into the nose daily.    Dispense:  16 g    Refill:  11  . gabapentin (NEURONTIN) 800 MG tablet    Sig: Take 1 tablet (800 mg total) by mouth 3 (three) times daily.    Dispense:  270 tablet    Refill:  3  . hydrALAZINE (APRESOLINE) 100 MG tablet    Sig: Take 1 tablet (100 mg total) by mouth 3 (three) times daily.    Dispense:  270 tablet    Refill:  3  . LORazepam (ATIVAN) 2 MG tablet    Sig: Take 1 tablet (2 mg total) by mouth at bedtime as needed. For sleep.    Dispense:  30 tablet    Refill:  2  . losartan (COZAAR) 100 MG tablet    Sig: Take 1 tablet (100 mg total) by mouth daily.    Dispense:  90 tablet    Refill:  3  . metoprolol (LOPRESSOR) 50 MG tablet    Sig: Take 1 tablet (50 mg total) by mouth 2 (two) times daily.    Dispense:  180 tablet    Refill:  3  . morphine (MS CONTIN) 30 MG 12 hr tablet    Sig: Take 1 tablet (30 mg total) by mouth 2 (two) times daily.    Dispense:  60 tablet    Refill:  0  . NIFEdipine (PROCARDIA-XL/ADALAT-CC/NIFEDICAL-XL) 30 MG 24 hr tablet    Sig: Take 1 tablet (30 mg total) by mouth daily.    Dispense:  90 tablet    Refill:  3  . omeprazole (PRILOSEC) 20 MG capsule    Sig: Take 1 capsule (20 mg total) by mouth daily.    Dispense:  90 capsule    Refill:  3  . sucralfate (CARAFATE) 1 G tablet    Sig: Take 1 tablet (1 g total) by mouth 2 (two) times daily.    Dispense:  90 tablet    Refill:  3  . Tamsulosin HCl (FLOMAX) 0.4 MG CAPS     Sig: Take 1 capsule (0.4 mg total) by mouth daily.    Dispense:  90 capsule    Refill:  3  . tiZANidine (ZANAFLEX) 4 MG tablet    Sig: Take 1 tablet (4 mg total) by mouth every 8 (eight) hours as needed. For muscle spasm.    Dispense:  30 tablet    Refill:  0  . topiramate (TOPAMAX) 100 MG tablet    Sig: Take 1 tablet (100 mg total) by mouth daily.    Dispense:  90 tablet    Refill:  3  . venlafaxine XR (EFFEXOR-XR) 37.5 MG 24 hr capsule  Sig: Take 1 capsule (37.5 mg total) by mouth daily.    Dispense:  90 capsule    Refill:  3  . nitrofurantoin, macrocrystal-monohydrate, (MACROBID) 100 MG capsule    Sig: Take 1 capsule (100 mg total) by mouth 2 (two) times daily.    Dispense:  14 capsule    Refill:  0    I spent over an hour in face to face care [place] today.   Abbe Amsterdam, MD

## 2012-02-24 NOTE — Patient Instructions (Addendum)
  [facility] [address] [place], [place] [address] Tel: [phone] Fax: [phone]    We will set up a CT scan of your head for tomorrow.    Please call the neurologist to set up an appointment as soon as you can.  Also, you need to see pain management.  Some options are Preferred pain management or Head pain management.    Use the macrobid for a possible UTI  I would recommend that you use an over the counter yeast treatment such as monistat.  The oral antifungal might interfere with some of your medications

## 2012-02-25 ENCOUNTER — Ambulatory Visit
Admission: RE | Admit: 2012-02-25 | Discharge: 2012-02-25 | Disposition: A | Discharge status: Home / Self Care | Payer: Medicare (Managed Care) | Source: Ambulatory Visit | Attending: Family Medicine | Admitting: Family Medicine

## 2012-02-25 ENCOUNTER — Other Ambulatory Visit: Payer: Medicare (Managed Care)

## 2012-02-25 DIAGNOSIS — R531 Weakness: Secondary | ICD-10-CM

## 2012-02-25 DIAGNOSIS — R200 Anesthesia of skin: Secondary | ICD-10-CM

## 2012-02-25 LAB — URINE CULTURE: Colony Count: 3000

## 2012-02-26 ENCOUNTER — Telehealth: Payer: Self-pay | Admitting: Family Medicine

## 2012-02-26 NOTE — Telephone Encounter (Signed)
Called and spoke with her this morning, gave lab results.  We discussed her challenges in getting in to see a neurologist.  She owes a balance at [facility].  Called [facility] and spoke with a billing specialist who did state that they are happy to work with [name], but she will need to pay something on her balance and probably commit to monthly payments.  [name] stated that she is willing to do this and she will call them back and start working on a plan

## 2012-03-01 ENCOUNTER — Encounter (HOSPITAL_COMMUNITY): Payer: Self-pay | Admitting: Emergency Medicine

## 2012-03-01 ENCOUNTER — Inpatient Hospital Stay (HOSPITAL_COMMUNITY)
Admission: EM | Admit: 2012-03-01 | Discharge: 2012-03-06 | DRG: 059 | Disposition: A | Discharge status: Home / Self Care | Payer: Medicare (Managed Care) | Attending: Internal Medicine | Admitting: Internal Medicine

## 2012-03-01 ENCOUNTER — Observation Stay (HOSPITAL_COMMUNITY): Payer: Medicare (Managed Care)

## 2012-03-01 ENCOUNTER — Emergency Department (HOSPITAL_COMMUNITY): Payer: Medicare (Managed Care)

## 2012-03-01 DIAGNOSIS — G35 Multiple sclerosis: Principal | ICD-10-CM

## 2012-03-01 DIAGNOSIS — Z6841 Body Mass Index (BMI) 40.0 and over, adult: Secondary | ICD-10-CM

## 2012-03-01 DIAGNOSIS — Z8639 Personal history of other endocrine, nutritional and metabolic disease: Secondary | ICD-10-CM

## 2012-03-01 DIAGNOSIS — J309 Allergic rhinitis, unspecified: Secondary | ICD-10-CM

## 2012-03-01 DIAGNOSIS — R5381 Other malaise: Secondary | ICD-10-CM

## 2012-03-01 DIAGNOSIS — G4733 Obstructive sleep apnea (adult) (pediatric): Secondary | ICD-10-CM | POA: Diagnosis present

## 2012-03-01 DIAGNOSIS — R531 Weakness: Secondary | ICD-10-CM

## 2012-03-01 DIAGNOSIS — E118 Type 2 diabetes mellitus with unspecified complications: Secondary | ICD-10-CM | POA: Diagnosis present

## 2012-03-01 DIAGNOSIS — R350 Frequency of micturition: Secondary | ICD-10-CM

## 2012-03-01 DIAGNOSIS — I10 Essential (primary) hypertension: Secondary | ICD-10-CM

## 2012-03-01 DIAGNOSIS — Z862 Personal history of diseases of the blood and blood-forming organs and certain disorders involving the immune mechanism: Secondary | ICD-10-CM

## 2012-03-01 DIAGNOSIS — R21 Rash and other nonspecific skin eruption: Secondary | ICD-10-CM

## 2012-03-01 DIAGNOSIS — K219 Gastro-esophageal reflux disease without esophagitis: Secondary | ICD-10-CM

## 2012-03-01 DIAGNOSIS — R339 Retention of urine, unspecified: Secondary | ICD-10-CM

## 2012-03-01 DIAGNOSIS — R000 Tachycardia, unspecified: Secondary | ICD-10-CM

## 2012-03-01 DIAGNOSIS — F32A Depression, unspecified: Secondary | ICD-10-CM

## 2012-03-01 DIAGNOSIS — H538 Other visual disturbances: Secondary | ICD-10-CM

## 2012-03-01 DIAGNOSIS — E119 Type 2 diabetes mellitus without complications: Secondary | ICD-10-CM

## 2012-03-01 DIAGNOSIS — F329 Major depressive disorder, single episode, unspecified: Secondary | ICD-10-CM

## 2012-03-01 DIAGNOSIS — J410 Simple chronic bronchitis: Secondary | ICD-10-CM

## 2012-03-01 DIAGNOSIS — F419 Anxiety disorder, unspecified: Secondary | ICD-10-CM

## 2012-03-01 DIAGNOSIS — E7800 Pure hypercholesterolemia, unspecified: Secondary | ICD-10-CM

## 2012-03-01 DIAGNOSIS — IMO0002 Reserved for concepts with insufficient information to code with codable children: Secondary | ICD-10-CM

## 2012-03-01 DIAGNOSIS — R0602 Shortness of breath: Secondary | ICD-10-CM

## 2012-03-01 DIAGNOSIS — G4700 Insomnia, unspecified: Secondary | ICD-10-CM

## 2012-03-01 DIAGNOSIS — G8929 Other chronic pain: Secondary | ICD-10-CM

## 2012-03-01 DIAGNOSIS — R197 Diarrhea, unspecified: Secondary | ICD-10-CM

## 2012-03-01 DIAGNOSIS — N390 Urinary tract infection, site not specified: Secondary | ICD-10-CM | POA: Diagnosis present

## 2012-03-01 DIAGNOSIS — F17200 Nicotine dependence, unspecified, uncomplicated: Secondary | ICD-10-CM

## 2012-03-01 DIAGNOSIS — N3000 Acute cystitis without hematuria: Secondary | ICD-10-CM

## 2012-03-01 LAB — COMPREHENSIVE METABOLIC PANEL
AST: 26 U/L (ref 0–37)
Albumin: 3.4 g/dL — ABNORMAL LOW (ref 3.5–5.2)
Alkaline Phosphatase: 98 U/L (ref 39–117)
BUN: 10 mg/dL (ref 6–23)
Chloride: 107 mEq/L (ref 96–112)
Potassium: 3.5 mEq/L (ref 3.5–5.1)
Sodium: 140 mEq/L (ref 135–145)
Total Bilirubin: 0.7 mg/dL (ref 0.3–1.2)
Total Protein: 6.7 g/dL (ref 6.0–8.3)

## 2012-03-01 LAB — URINALYSIS, ROUTINE W REFLEX MICROSCOPIC
Glucose, UA: NEGATIVE mg/dL
Hgb urine dipstick: NEGATIVE
Ketones, ur: NEGATIVE mg/dL
Protein, ur: NEGATIVE mg/dL
pH: 6 (ref 5.0–8.0)

## 2012-03-01 LAB — URINE MICROSCOPIC-ADD ON

## 2012-03-01 LAB — CBC WITH DIFFERENTIAL/PLATELET
Basophils Absolute: 0 10*3/uL (ref 0.0–0.1)
Basophils Relative: 0 % (ref 0–1)
Eosinophils Absolute: 0.1 10*3/uL (ref 0.0–0.7)
Hemoglobin: 13.4 g/dL (ref 12.0–15.0)
MCH: 29.1 pg (ref 26.0–34.0)
MCHC: 34.6 g/dL (ref 30.0–36.0)
Monocytes Relative: 11 % (ref 3–12)
Neutro Abs: 2.8 10*3/uL (ref 1.7–7.7)
Neutrophils Relative %: 39 % — ABNORMAL LOW (ref 43–77)
Platelets: 228 10*3/uL (ref 150–400)
RDW: 13.6 % (ref 11.5–15.5)

## 2012-03-01 LAB — CK: Total CK: 70 U/L (ref 7–177)

## 2012-03-01 LAB — GLUCOSE, CAPILLARY: Glucose-Capillary: 91 mg/dL (ref 70–99)

## 2012-03-01 MED ORDER — NIFEDIPINE ER 30 MG PO TB24
30.0000 mg | ORAL_TABLET | Freq: Every day | ORAL | Status: DC
  Administered 2012-03-02: 30 mg via ORAL
  Filled 2012-03-01 (×3): qty 1

## 2012-03-01 MED ORDER — DULOXETINE HCL 30 MG PO CPEP
30.0000 mg | ORAL_CAPSULE | Freq: Every day | ORAL | Status: DC
  Administered 2012-03-02: 30 mg via ORAL
  Filled 2012-03-01 (×2): qty 1

## 2012-03-01 MED ORDER — CLONIDINE HCL 0.2 MG PO TABS
0.2000 mg | ORAL_TABLET | Freq: Two times a day (BID) | ORAL | Status: DC
  Administered 2012-03-02 – 2012-03-06 (×8): 0.2 mg via ORAL
  Filled 2012-03-01 (×11): qty 1

## 2012-03-01 MED ORDER — PANTOPRAZOLE SODIUM 40 MG PO TBEC
40.0000 mg | DELAYED_RELEASE_TABLET | Freq: Every day | ORAL | Status: DC
  Administered 2012-03-02: 40 mg via ORAL
  Filled 2012-03-01 (×2): qty 1

## 2012-03-01 MED ORDER — MORPHINE SULFATE ER 30 MG PO TBCR
30.0000 mg | EXTENDED_RELEASE_TABLET | Freq: Two times a day (BID) | ORAL | Status: DC
  Administered 2012-03-02 – 2012-03-06 (×9): 30 mg via ORAL
  Filled 2012-03-01 (×9): qty 1

## 2012-03-01 MED ORDER — TOPIRAMATE 100 MG PO TABS
100.0000 mg | ORAL_TABLET | Freq: Every day | ORAL | Status: DC
  Administered 2012-03-02: 100 mg via ORAL
  Filled 2012-03-01 (×2): qty 1

## 2012-03-01 MED ORDER — SUCRALFATE 1 G PO TABS
1.0000 g | ORAL_TABLET | Freq: Two times a day (BID) | ORAL | Status: DC
  Administered 2012-03-02 – 2012-03-06 (×10): 1 g via ORAL
  Filled 2012-03-01 (×12): qty 1

## 2012-03-01 MED ORDER — LORAZEPAM 2 MG/ML IJ SOLN
1.0000 mg | Freq: Once | INTRAMUSCULAR | Status: AC
  Administered 2012-03-01: 1 mg via INTRAVENOUS
  Filled 2012-03-01: qty 1

## 2012-03-01 MED ORDER — SODIUM CHLORIDE 0.9 % IJ SOLN: 3.0000 mL | INTRAMUSCULAR | Status: DC | PRN

## 2012-03-01 MED ORDER — HEPARIN SODIUM (PORCINE) 5000 UNIT/ML IJ SOLN
5000.0000 [IU] | Freq: Three times a day (TID) | INTRAMUSCULAR | Status: DC
  Administered 2012-03-02 – 2012-03-06 (×14): 5000 [IU] via SUBCUTANEOUS
  Filled 2012-03-01 (×18): qty 1

## 2012-03-01 MED ORDER — DIPHENOXYLATE-ATROPINE 2.5-0.025 MG PO TABS
1.0000 | ORAL_TABLET | Freq: Two times a day (BID) | ORAL | Status: DC
  Administered 2012-03-02 – 2012-03-06 (×10): 1 via ORAL
  Filled 2012-03-01 (×10): qty 1

## 2012-03-01 MED ORDER — TIZANIDINE HCL 4 MG PO TABS
4.0000 mg | ORAL_TABLET | Freq: Three times a day (TID) | ORAL | Status: DC | PRN
  Filled 2012-03-01 (×2): qty 1

## 2012-03-01 MED ORDER — METOPROLOL TARTRATE 50 MG PO TABS
50.0000 mg | ORAL_TABLET | Freq: Two times a day (BID) | ORAL | Status: DC
  Administered 2012-03-02 – 2012-03-06 (×10): 50 mg via ORAL
  Filled 2012-03-01 (×11): qty 1

## 2012-03-01 MED ORDER — GABAPENTIN 400 MG PO CAPS
800.0000 mg | ORAL_CAPSULE | Freq: Three times a day (TID) | ORAL | Status: DC
  Administered 2012-03-02 – 2012-03-06 (×14): 800 mg via ORAL
  Filled 2012-03-01 (×17): qty 2

## 2012-03-01 MED ORDER — MORPHINE SULFATE 4 MG/ML IJ SOLN
4.0000 mg | Freq: Once | INTRAMUSCULAR | Status: AC
  Administered 2012-03-01: 4 mg via INTRAVENOUS
  Filled 2012-03-01: qty 1

## 2012-03-01 MED ORDER — FLUTICASONE PROPIONATE 50 MCG/ACT NA SUSP
2.0000 | Freq: Every day | NASAL | Status: DC
  Administered 2012-03-02: 2 via NASAL
  Filled 2012-03-01: qty 16

## 2012-03-01 MED ORDER — TAMSULOSIN HCL 0.4 MG PO CAPS
0.4000 mg | ORAL_CAPSULE | Freq: Every day | ORAL | Status: DC
  Administered 2012-03-02: 0.4 mg via ORAL
  Filled 2012-03-01 (×2): qty 1

## 2012-03-01 MED ORDER — SODIUM CHLORIDE 0.9 % IV SOLN: 250.0000 mL | INTRAVENOUS | Status: DC | PRN

## 2012-03-01 MED ORDER — ATORVASTATIN CALCIUM 80 MG PO TABS
80.0000 mg | ORAL_TABLET | Freq: Every day | ORAL | Status: DC
  Administered 2012-03-02 – 2012-03-05 (×5): 80 mg via ORAL
  Filled 2012-03-01 (×7): qty 1

## 2012-03-01 MED ORDER — INSULIN ASPART 100 UNIT/ML ~~LOC~~ SOLN
0.0000 [IU] | Freq: Three times a day (TID) | SUBCUTANEOUS | Status: DC
  Administered 2012-03-02 (×2): 2 [IU] via SUBCUTANEOUS
  Administered 2012-03-03: 5 [IU] via SUBCUTANEOUS

## 2012-03-01 MED ORDER — HYDRALAZINE HCL 50 MG PO TABS
100.0000 mg | ORAL_TABLET | Freq: Three times a day (TID) | ORAL | Status: DC
  Administered 2012-03-02 – 2012-03-06 (×12): 100 mg via ORAL
  Filled 2012-03-01 (×17): qty 2

## 2012-03-01 MED ORDER — GABAPENTIN 800 MG PO TABS
800.0000 mg | ORAL_TABLET | Freq: Three times a day (TID) | ORAL | Status: DC
  Filled 2012-03-01: qty 1

## 2012-03-01 MED ORDER — SODIUM CHLORIDE 0.9 % IJ SOLN
3.0000 mL | Freq: Two times a day (BID) | INTRAMUSCULAR | Status: DC
  Administered 2012-03-02 – 2012-03-06 (×10): 3 mL via INTRAVENOUS

## 2012-03-01 MED ORDER — ALPRAZOLAM 1 MG PO TABS
1.0000 mg | ORAL_TABLET | Freq: Two times a day (BID) | ORAL | Status: DC | PRN
  Administered 2012-03-02 – 2012-03-06 (×7): 1 mg via ORAL
  Filled 2012-03-01 (×7): qty 1

## 2012-03-01 MED ORDER — HYDROMORPHONE HCL 4 MG PO TABS
8.0000 mg | ORAL_TABLET | ORAL | Status: DC | PRN
  Administered 2012-03-01 – 2012-03-06 (×14): 8 mg via ORAL
  Filled 2012-03-01 (×15): qty 2

## 2012-03-01 MED ORDER — VENLAFAXINE HCL ER 37.5 MG PO CP24
37.5000 mg | ORAL_CAPSULE | Freq: Every day | ORAL | Status: DC
  Administered 2012-03-02: 37.5 mg via ORAL
  Filled 2012-03-01 (×2): qty 1

## 2012-03-01 MED ORDER — LOSARTAN POTASSIUM 50 MG PO TABS
100.0000 mg | ORAL_TABLET | Freq: Every day | ORAL | Status: DC
  Administered 2012-03-02: 100 mg via ORAL
  Filled 2012-03-01 (×2): qty 2

## 2012-03-01 MED ORDER — NITROFURANTOIN MONOHYD MACRO 100 MG PO CAPS
100.0000 mg | ORAL_CAPSULE | Freq: Two times a day (BID) | ORAL | Status: DC
  Administered 2012-03-02 – 2012-03-03 (×4): 100 mg via ORAL
  Filled 2012-03-01 (×6): qty 1

## 2012-03-01 MED ORDER — KETOROLAC TROMETHAMINE 30 MG/ML IJ SOLN
30.0000 mg | Freq: Once | INTRAMUSCULAR | Status: AC
  Administered 2012-03-01: 30 mg via INTRAVENOUS
  Filled 2012-03-01: qty 1

## 2012-03-01 NOTE — ED Notes (Addendum)
Pt transported to MRI 

## 2012-03-01 NOTE — ED Provider Notes (Signed)
History     CSN: [id number]  Arrival date & time 03/01/12  1444   First MD Initiated Contact with Patient 03/01/12 1620      Chief Complaint  Patient presents with  . Fatigue  . Weakness  . Generalized Body Aches    (Consider location/radiation/quality/duration/timing/severity/associated sxs/prior treatment) HPI Pt seen 9/28 with similar symptoms. Discussed with neurology and felt not to be MS flare. Given [facility] info and told to f/u. Pt has not f/u'd with neurology. She presents with 3-4 days of diffuse body aches, generalized weakness, episodic blurred vision. Pt is taking MS contin at home for chronic pain. No focal weakness or sensory changes. No N/V/D abd pain. Has generalized, vague HA and neck pain Past Medical History  Diagnosis Date  . Multiple sclerosis exacerbation   . Hypertension   . Diabetes mellitus     Past Surgical History  Procedure Date  . Joint replacement     LTK  . Abdominal hysterectomy     partial    History reviewed. No pertinent family history.  History  Substance Use Topics  . Smoking status: Current Every Day Smoker -- 0.2 packs/day for 12 years    Types: Cigarettes  . Smokeless tobacco: Never Used  . Alcohol Use: No    OB History    Grav Para Term Preterm Abortions TAB SAB Ect Mult Living                  Review of Systems  Constitutional: Positive for fatigue. Negative for fever and chills.  HENT: Positive for neck pain. Negative for neck stiffness.   Eyes: Positive for visual disturbance.  Respiratory: Negative for shortness of breath.   Cardiovascular: Negative for chest pain, palpitations and leg swelling.  Gastrointestinal: Negative for nausea, vomiting, abdominal pain and diarrhea.  Genitourinary: Negative for dysuria and flank pain.  Musculoskeletal: Positive for myalgias, back pain and arthralgias.  Skin: Negative for rash and wound.  Neurological: Positive for weakness and headaches. Negative for dizziness,  seizures, syncope, speech difficulty, light-headedness and numbness.    Allergies  Review of patient's allergies indicates no known allergies.  Home Medications   Current Outpatient Rx  Name Route Sig Dispense Refill  . ALPRAZOLAM 1 MG PO TABS Oral Take 1 tablet (1 mg total) by mouth 2 (two) times daily as needed. For anxiety. 30 tablet 2  . ATORVASTATIN CALCIUM 80 MG PO TABS Oral Take 1 tablet (80 mg total) by mouth daily. 90 tablet 3  . CLONIDINE HCL 0.2 MG PO TABS Oral Take 1 tablet (0.2 mg total) by mouth 2 (two) times daily. 180 tablet 3  . DIPHENOXYLATE-ATROPINE 2.5-0.025 MG PO TABS Oral Take 1 tablet by mouth 2 (two) times daily. For diarrhea. 60 tablet 3  . DULOXETINE HCL 30 MG PO CPEP Oral Take 30 mg by mouth daily.    Marland Kitchen FLUTICASONE PROPIONATE 50 MCG/ACT NA SUSP Nasal Place 2 sprays into the nose daily. 16 g 11  . GABAPENTIN 800 MG PO TABS Oral Take 1 tablet (800 mg total) by mouth 3 (three) times daily. 270 tablet 3  . HYDRALAZINE HCL 100 MG PO TABS Oral Take 100 mg by mouth 3 (three) times daily.    Marland Kitchen HYDROMORPHONE HCL 8 MG PO TABS Oral Take 8 mg by mouth every 4 (four) hours as needed. For pain.    Marland Kitchen LORAZEPAM 2 MG PO TABS Oral Take 1 tablet (2 mg total) by mouth at bedtime as needed. For sleep.  30 tablet 2  . LOSARTAN POTASSIUM 100 MG PO TABS Oral Take 1 tablet (100 mg total) by mouth daily. 90 tablet 3  . METOPROLOL TARTRATE 50 MG PO TABS Oral Take 1 tablet (50 mg total) by mouth 2 (two) times daily. 180 tablet 3  . MORPHINE SULFATE ER 30 MG PO TBCR Oral Take 1 tablet (30 mg total) by mouth 2 (two) times daily. 60 tablet 0  . TYSABRI IV Intravenous Inject 300 mg into the vein every 30 (thirty) days.    Marland Kitchen NIFEDIPINE ER OSMOTIC 30 MG PO TB24 Oral Take 30 mg by mouth daily.    Marland Kitchen NITROFURANTOIN MONOHYD MACRO 100 MG PO CAPS Oral Take 1 capsule (100 mg total) by mouth 2 (two) times daily. 14 capsule 0  . OMEPRAZOLE 20 MG PO CPDR Oral Take 1 capsule (20 mg total) by mouth daily. 90  capsule 3  . SUCRALFATE 1 G PO TABS Oral Take 1 tablet (1 g total) by mouth 2 (two) times daily. 90 tablet 3  . TAMSULOSIN HCL 0.4 MG PO CAPS Oral Take 1 capsule (0.4 mg total) by mouth daily. 90 capsule 3  . TIZANIDINE HCL 4 MG PO TABS Oral Take 1 tablet (4 mg total) by mouth every 8 (eight) hours as needed. For muscle spasm. 30 tablet 0  . TOPIRAMATE 100 MG PO TABS Oral Take 1 tablet (100 mg total) by mouth daily. 90 tablet 3  . VENLAFAXINE HCL ER 37.5 MG PO CP24 Oral Take 1 capsule (37.5 mg total) by mouth daily. 90 capsule 3    BP 122/56  Pulse 66  Temp 99.7 F (37.6 C) (Rectal)  Resp 20  SpO2 99%  Physical Exam  Nursing note and vitals reviewed. Constitutional: She is oriented to person, place, and time. She appears well-developed and well-nourished. No distress.  HENT:  Head: Normocephalic and atraumatic.  Mouth/Throat: Oropharynx is clear and moist.  Eyes: EOM are normal. Pupils are equal, round, and reactive to light.       No definite papilledema   Neck: Normal range of motion. Neck supple.       No midline TTP  Cardiovascular: Normal rate and regular rhythm.   Pulmonary/Chest: Effort normal and breath sounds normal. No respiratory distress. She has no wheezes. She has no rales.  Abdominal: Soft. Bowel sounds are normal. She exhibits no mass. There is no tenderness. There is no rebound and no guarding.  Musculoskeletal: Normal range of motion. She exhibits tenderness (Diffuse muscular TTP of ext). She exhibits no edema.  Neurological: She is alert and oriented to person, place, and time.       CN II-XII intact, 5/5 motor in all ext, sensation grossly intact  Skin: Skin is warm and dry. No rash noted. No erythema.  Psychiatric: She has a normal mood and affect. Her behavior is normal.    ED Course  Procedures (including critical care time)  Labs Reviewed  CBC WITH DIFFERENTIAL - Abnormal; Notable for the following:    Neutrophils Relative 39 (*)     Lymphocytes  Relative 49 (*)     All other components within normal limits  COMPREHENSIVE METABOLIC PANEL - Abnormal; Notable for the following:    Albumin 3.4 (*)     All other components within normal limits  URINALYSIS, ROUTINE W REFLEX MICROSCOPIC - Abnormal; Notable for the following:    APPearance CLOUDY (*)     Leukocytes, UA TRACE (*)     All other components within  normal limits  URINE MICROSCOPIC-ADD ON - Abnormal; Notable for the following:    Squamous Epithelial / LPF FEW (*)     Bacteria, UA FEW (*)     All other components within normal limits  CK   No results found.   1. Weakness       MDM  Discussed with Dr Thad Ranger, neurology. Would not advise immediate intervention with steroids given incomplete records. Pt with unsteadiness and needing assistance with ambulation. Given symptoms and repeat presentation, will admit for obs and neuro eval.    Discussed with Dr Amada Jupiter who will see in ED  Seen by Dr Amada Jupiter. Requests admit and will likely require IV steroids though not before MRI    Loren Racer, MD 03/01/12 2156

## 2012-03-01 NOTE — Consult Note (Signed)
Reason for Consult: Lower Extremity weakness Referring Physician: Loren Racer  CC: Left leg weakness  History is obtained from:Patient, Mother  HPI: [name] is an 51 y.o. female with a history of MS treated with Tysabri who presents with one month of leg weakness, hand numbness, blurred/double vision. She states that she came in one month ago with similar symptoms, but now has hand numbness and worsening leg weakness to the point where she is having trouble getting around at home.  She got a single shot of steroids during her previous ER visit, but feels like it wasn't enough to make her improve. She typically does improve of steroids during her previous exacerbations.  Of note, patient has a urinary tract infection and was recently started on treatment on Wednesday.  She was being managed in [place]  ROS: An 11 point ROS was performed and is negative except as noted in the HPI.  Past Medical History  Diagnosis Date  . Multiple sclerosis exacerbation   . Hypertension   . Diabetes mellitus     Family History: No history of autoimmune disease or multiple sclerosis  Social History: Tob: Positive  Exam: Current vital signs: BP 122/56  Pulse 66  Temp 98.6 F (37 C) (Oral)  Resp 20  SpO2 99% Vital signs in last 24 hours: Temp:  [97.9 F (36.6 C)-98.6 F (37 C)] 98.6 F (37 C) (10/15 2010) Pulse Rate:  [66] 66  (10/15 2010) Resp:  [16-20] 20  (10/15 2010) BP: (108-122)/(56-65) 122/56 mmHg (10/15 2010) SpO2:  [98 %-99 %] 99 % (10/15 2010)  General: In bed, no apparent distress CV: Regular rate and rhythm Mental Status: Patient is awake, alert, oriented to person, place, month, year, and situation. Immediate and remote memory are intact. Patient is able to give a clear and coherent history. Cranial Nerves: II: Visual Fields are full. Pupils are equal, round, and reactive to light.  Discs are sharp, possible mild pallor of left optic disc. III,IV, VI: Versions  are conjugate, however the patient reports diplopia on rightward gaze, also has a mild right ptosis.  V,VII: Facial movement symmetric. Decrease sensation on left VIII: hearing is intact to voice X: Uvula elevates symmetrically XI: Shoulder shrug is symmetric. XII: tongue is midline without atrophy or fasciculations.  Motor: Tone is normal. Bulk is normal. She has 5/5 strength throughout her right side. When testing her left side, she guards and complains of pain. She is able to give at least 4+/5 strength throughout her left arm, but has a downward drift without pronation. In her left leg, she has give way weakness, but is at least 4/5 throughout Sensory: Sensation is decreased throughout the left side including face arm and leg to temperature, light touch Deep Tendon Reflexes: 2+ and symmetric in the biceps and patellae. 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   Impression: 51 yo F with multiple sclerosis and new symptoms of worsening left leg weakness and left hand numbness. Her exam does have some inconsistencies concerning for psychogenic overlay, however her imaging is convincing for demyelinating disease and she reports a history consistent with optic neuritis. Her single brain lesion is relatively  unimpressive, and I would consider neuromyelitis optica in the differential without that lesion. I do not know if she has had testing for aquaporin-4 antibodies in the past, but if not then it may be reasonable to check these as an outpatient if the brain MRI does not show more lesions.   An MRI showing progression of disease,  particularly an enhancing lesion would be most helpful in guiding further care.   Also, obtaining records regarding NMO anti-bodies(aquaporin-4) and results of previous LPs would be helpful.   Recommendations: 1) MRI brain, C-Spine, T-Spine with and without contrast.  2) Following MRI, would give IV solumedrol, 500mg  BID x 5 days.  3) Gastric prophylaxis and frequent BG checks while on steroids.  4) Would continue antispasmodics(zanaflex, gabapentin) at home doses.  5) PT, OT consults.  6) Patient is establishing care with Dr[facility] Anne H[facility] at [facility].Recently moved back from [place].   Cynthia Slot, MD [facility] [phone]  If 7pm- 7am, please page neurology on call at [phone].

## 2012-03-01 NOTE — Progress Notes (Signed)
[facility] ED CM consulted by ED patient advocate.  Pt voiced concern about ED treatment and ongoing neurology services.  Cm spoke with pt and her mother Pt states she owes [facility] money and will not have the money until 03/20/12 Cm reviewed EPIC notes to find [facility] note from Dr Dallas Schimke on 02/26/12 stating [employer] contacted to assist pt with monthly payments  Pt wanting crisis treatment Pt states in past she has been given iv steroid x 3 doses Pt states she is agreeance to have ED start first dose and have [facility] continue doses at home.  CM discussed this with [place], EDP who is awaiting further test results and return call from consulting neurologist

## 2012-03-01 NOTE — ED Notes (Signed)
Pt brought in by EMS for "relapse of MS" and progressive generalized weakness. Pt reports bilateral leg pain and weakness x's 2-3 days w/ "legs giving out and falling" Saturday and Monday. Pt reports blurred vision, chills, and numbness and tingling in left hand.

## 2012-03-01 NOTE — ED Notes (Signed)
Pt back from MRI 

## 2012-03-01 NOTE — ED Notes (Signed)
Per EMS: Pt from home c/o "ms flare up".  Pt c/o fatigue, weakness, and generalized body aches 10/10.

## 2012-03-01 NOTE — H&P (Signed)
[facility] History and Physical  SOLSTICE TSAY ZOX:[id number] DOB: 11/10/1960 DOA: 03/01/2012  Referring physician: ED PCP: Nestor Ramp  Specialists: Neurology consulted and will eval patient in ED  Chief Complaint: MS flare up  HPI: [name] is a 51 y.o. female who presents with complaint of MS flare up.  Patient was seen on 02/13/12 with similar symptoms.  Discussed with neurology and felt not to be MS flare at that time, given [facility] info and told to f/u, but patient didn't f/u with neurology (see case worker notes for details but basically issue with oweing [employer] money).  Basically patient having severe trouble walking and leg pain secondary to MS flare, her MS is primarily spinal MS.  Complicating matters is that she has been out of her PO dilaudid she takes at baseline for chronic pain with her MS, and has been taking extra MS contin 12hr at home, she was receiving this medication from a pain specialist down in [place], but after moving to the area here she has an appointment set up with one locally but not until the 21st.  In the ED neurology was contacted however they were initially skeptical of giving IV steroids, hospitalist was asked to admit the patient.  Review of Systems: 12 systems reviewed and negative except as per HPI.  Past Medical History  Diagnosis Date  . Multiple sclerosis exacerbation   . Hypertension   . Diabetes mellitus    Past Surgical History  Procedure Date  . Joint replacement     LTK  . Abdominal hysterectomy     partial   Social History:  reports that she has been smoking Cigarettes.  She has a 3 pack-year smoking history. She has never used smokeless tobacco. She reports that she does not drink alcohol or use illicit drugs.   No Known Allergies  History reviewed. No pertinent family history. No family history of MS  Prior to Admission medications   Medication Sig Start Date End Date Taking? Authorizing  Provider  ALPRAZolam Prudy Feeler) 1 MG tablet Take 1 tablet (1 mg total) by mouth 2 (two) times daily as needed. For anxiety. 02/24/12  Yes Gwenlyn Found Copland, MD  atorvastatin (LIPITOR) 80 MG tablet Take 1 tablet (80 mg total) by mouth daily. 02/24/12  Yes Gwenlyn Found Copland, MD  cloNIDine (CATAPRES) 0.2 MG tablet Take 1 tablet (0.2 mg total) by mouth 2 (two) times daily. 02/24/12  Yes Gwenlyn Found Copland, MD  diphenoxylate-atropine (LOMOTIL) 2.5-0.025 MG per tablet Take 1 tablet by mouth 2 (two) times daily. For diarrhea. 02/24/12  Yes Gwenlyn Found Copland, MD  DULoxetine (CYMBALTA) 30 MG capsule Take 30 mg by mouth daily.   Yes Historical Provider, MD  fluticasone (FLONASE) 50 MCG/ACT nasal spray Place 2 sprays into the nose daily. 02/24/12  Yes Gwenlyn Found Copland, MD  gabapentin (NEURONTIN) 800 MG tablet Take 1 tablet (800 mg total) by mouth 3 (three) times daily. 02/24/12  Yes Gwenlyn Found Copland, MD  hydrALAZINE (APRESOLINE) 100 MG tablet Take 100 mg by mouth 3 (three) times daily. 02/24/12  Yes Gwenlyn Found Copland, MD  HYDROmorphone (DILAUDID) 8 MG tablet Take 8 mg by mouth every 4 (four) hours as needed. For pain.   Yes Historical Provider, MD  LORazepam (ATIVAN) 2 MG tablet Take 1 tablet (2 mg total) by mouth at bedtime as needed. For sleep. 02/24/12  Yes Gwenlyn Found Copland, MD  losartan (COZAAR) 100 MG tablet Take 1 tablet (100 mg total)  by mouth daily. 02/24/12  Yes Gwenlyn Found Copland, MD  metoprolol (LOPRESSOR) 50 MG tablet Take 1 tablet (50 mg total) by mouth 2 (two) times daily. 02/24/12  Yes Gwenlyn Found Copland, MD  morphine (MS CONTIN) 30 MG 12 hr tablet Take 1 tablet (30 mg total) by mouth 2 (two) times daily. 02/24/12  Yes Gwenlyn Found Copland, MD  Natalizumab (TYSABRI IV) Inject 300 mg into the vein every 30 (thirty) days.   Yes Historical Provider, MD  NIFEdipine (PROCARDIA-XL/ADALAT-CC/NIFEDICAL-XL) 30 MG 24 hr tablet Take 30 mg by mouth daily. 02/24/12  Yes Gwenlyn Found Copland, MD  nitrofurantoin,  macrocrystal-monohydrate, (MACROBID) 100 MG capsule Take 1 capsule (100 mg total) by mouth 2 (two) times daily. 02/24/12  Yes Gwenlyn Found Copland, MD  omeprazole (PRILOSEC) 20 MG capsule Take 1 capsule (20 mg total) by mouth daily. 02/24/12  Yes Gwenlyn Found Copland, MD  sucralfate (CARAFATE) 1 G tablet Take 1 tablet (1 g total) by mouth 2 (two) times daily. 02/24/12  Yes Gwenlyn Found Copland, MD  Tamsulosin HCl (FLOMAX) 0.4 MG CAPS Take 1 capsule (0.4 mg total) by mouth daily. 02/24/12  Yes Gwenlyn Found Copland, MD  tiZANidine (ZANAFLEX) 4 MG tablet Take 1 tablet (4 mg total) by mouth every 8 (eight) hours as needed. For muscle spasm. 02/24/12  Yes Gwenlyn Found Copland, MD  topiramate (TOPAMAX) 100 MG tablet Take 1 tablet (100 mg total) by mouth daily. 02/24/12  Yes Gwenlyn Found Copland, MD  venlafaxine XR (EFFEXOR-XR) 37.5 MG 24 hr capsule Take 1 capsule (37.5 mg total) by mouth daily. 02/24/12  Yes Pearline Cables, MD   Physical Exam: Filed Vitals:   03/01/12 1631  BP: 108/65  Pulse: 66  Temp: 97.9 F (36.6 C)  TempSrc: Oral  Resp: 16  SpO2: 98%     General:  NAD, resting comfortably in hospital bed  Eyes: PEERLA EOMI, I attempted a retinal exam but am not really able to visualize the patients retina well at this time  ENT: mucous membranes moist  Neck: supple w/o JVD  Cardiovascular: RRR w/o MRG  Respiratory: CTA B  Abdomen: soft, nt, nd, bs+  Skin: no rash no lesion  Musculoskeletal: MAE, full ROM all 4 extremities  Psychiatric: normal tone and affect  Neurologic: CN 2-12 intact, 5/5 motor in all extremities, sensation grossly intact.  Labs on Admission:  Basic Metabolic Panel:  Lab 03/01/12 1610 02/24/12 1044  NA 140 142  K 3.5 4.1  CL 107 107  CO2 25 28  GLUCOSE 81 96  BUN 10 11  CREATININE 0.72 0.85  CALCIUM 9.5 9.7  MG -- --  PHOS -- --   Liver Function Tests:  Lab 03/01/12 1653  AST 26  ALT 24  ALKPHOS 98  BILITOT 0.7  PROT 6.7  ALBUMIN 3.4*   No results found  for this basename: LIPASE:5,AMYLASE:5 in the last 168 hours No results found for this basename: AMMONIA:5 in the last 168 hours CBC:  Lab 03/01/12 1653  WBC 7.2  NEUTROABS 2.8  HGB 13.4  HCT 38.7  MCV 83.9  PLT 228   Cardiac Enzymes:  Lab 03/01/12 1653  CKTOTAL 70  CKMB --  CKMBINDEX --  TROPONINI --    BNP (last 3 results) No results found for this basename: PROBNP:3 in the last 8760 hours CBG: No results found for this basename: GLUCAP:5 in the last 168 hours  Radiological Exams on Admission: No results found.  EKG: Independently reviewed.  Assessment/Plan Active  Problems:  * No active hospital problems. *    1. Possible MS flare vs chronic pain flare up due to being out of dilaudid - In the ED, there was concern given difficult ambulation of the patient.  Hospitalist was consulted to admit, after talking with Dr. Ranae Palms and the patient all of [place] have agreed on a course of action that includes getting neurology's opinion tonight (specifically as to wether or not she requires IV steroids for treatment (neurology's initial opinion over the phone sounded like they were skeptical of giving high dose IV steroids to the patient for a MS flare given her lack of focal neurologic deficits on exam).  We are agreed that if neurology says the patient needs IV steroids then hospitalist service will admit the patient for IV steroids (type, dose, and frequency per neurology recommendations).  If however neurology does not feel the patient needs IV steroids at this time then this is a chronic pain issue (specifically one of running out of her PO dilaudid which the patient fully admits was working very well for her at home) and she would not be admitted (nor want to be admitted) to the hospital if PO pain medication was to be the mainstay of treatment. 2. H/o DM2 off of meds at baseline - if we do give steroids will order a SSI during inpatient stay for coverage 3. HTN continue home  meds.  Code Status: Full Code Family Communication: discussed with patient and mother who was at bedside Disposition Plan: Admit to obs if neurology wants IV steroids, ok to discharge home if no IV steroids planned  Time spent: 50 mins  Tagen Brethauer M. [facility] Pager [phone]  If 7PM-7AM, please contact night-coverage [email or web address] Password [email or web address] 03/01/2012, 8:02 PM

## 2012-03-01 NOTE — Progress Notes (Signed)
Update: was informed by ED that neurology wants MRI for the patient but plans on having [place] admit and start steroids, dose dependant on findings of MRI.  Will put admission orders in at this time.

## 2012-03-01 NOTE — ED Notes (Signed)
Attempted to call report RN busy

## 2012-03-02 ENCOUNTER — Observation Stay (HOSPITAL_COMMUNITY): Payer: Medicare (Managed Care)

## 2012-03-02 DIAGNOSIS — G35 Multiple sclerosis: Principal | ICD-10-CM

## 2012-03-02 DIAGNOSIS — E119 Type 2 diabetes mellitus without complications: Secondary | ICD-10-CM

## 2012-03-02 DIAGNOSIS — I10 Essential (primary) hypertension: Secondary | ICD-10-CM

## 2012-03-02 LAB — GLUCOSE, CAPILLARY
Glucose-Capillary: 102 mg/dL — ABNORMAL HIGH (ref 70–99)
Glucose-Capillary: 130 mg/dL — ABNORMAL HIGH (ref 70–99)

## 2012-03-02 LAB — HEMOGLOBIN A1C: Mean Plasma Glucose: 105 mg/dL (ref <117)

## 2012-03-02 MED ORDER — TAMSULOSIN HCL 0.4 MG PO CAPS
0.4000 mg | ORAL_CAPSULE | Freq: Every day | ORAL | Status: DC
  Administered 2012-03-02 – 2012-03-05 (×4): 0.4 mg via ORAL
  Filled 2012-03-02 (×5): qty 1

## 2012-03-02 MED ORDER — VENLAFAXINE HCL ER 37.5 MG PO CP24
37.5000 mg | ORAL_CAPSULE | Freq: Every day | ORAL | Status: DC
  Administered 2012-03-02 – 2012-03-05 (×4): 37.5 mg via ORAL
  Filled 2012-03-02 (×5): qty 1

## 2012-03-02 MED ORDER — DULOXETINE HCL 30 MG PO CPEP
30.0000 mg | ORAL_CAPSULE | Freq: Every day | ORAL | Status: DC
  Administered 2012-03-02 – 2012-03-05 (×4): 30 mg via ORAL
  Filled 2012-03-02 (×5): qty 1

## 2012-03-02 MED ORDER — FLUTICASONE PROPIONATE 50 MCG/ACT NA SUSP
2.0000 | Freq: Every day | NASAL | Status: DC
  Administered 2012-03-02 – 2012-03-05 (×4): 2 via NASAL
  Filled 2012-03-02: qty 16

## 2012-03-02 MED ORDER — GADOBENATE DIMEGLUMINE 529 MG/ML IV SOLN
20.0000 mL | Freq: Once | INTRAVENOUS | Status: AC | PRN
  Administered 2012-03-02: 20 mL via INTRAVENOUS

## 2012-03-02 MED ORDER — TOPIRAMATE 100 MG PO TABS
100.0000 mg | ORAL_TABLET | Freq: Every day | ORAL | Status: DC
  Administered 2012-03-02 – 2012-03-05 (×4): 100 mg via ORAL
  Filled 2012-03-02 (×6): qty 1

## 2012-03-02 MED ORDER — LOSARTAN POTASSIUM 50 MG PO TABS
100.0000 mg | ORAL_TABLET | Freq: Every day | ORAL | Status: DC
  Administered 2012-03-02 – 2012-03-05 (×4): 100 mg via ORAL
  Filled 2012-03-02 (×5): qty 2

## 2012-03-02 MED ORDER — NIFEDIPINE ER 30 MG PO TB24
30.0000 mg | ORAL_TABLET | Freq: Every day | ORAL | Status: DC
  Administered 2012-03-02 – 2012-03-05 (×4): 30 mg via ORAL
  Filled 2012-03-02 (×5): qty 1

## 2012-03-02 MED ORDER — INSULIN ASPART 100 UNIT/ML ~~LOC~~ SOLN: 0.0000 [IU] | Freq: Three times a day (TID) | SUBCUTANEOUS | Status: DC

## 2012-03-02 MED ORDER — LORAZEPAM 2 MG/ML IJ SOLN: 1.0000 mg | Freq: Once | INTRAMUSCULAR | Status: DC

## 2012-03-02 MED ORDER — CHLORHEXIDINE GLUCONATE CLOTH 2 % EX PADS
6.0000 | MEDICATED_PAD | Freq: Every day | CUTANEOUS | Status: AC
  Administered 2012-03-02 – 2012-03-06 (×5): 6 via TOPICAL

## 2012-03-02 MED ORDER — LORAZEPAM 2 MG/ML IJ SOLN
2.0000 mg | Freq: Once | INTRAMUSCULAR | Status: AC
  Administered 2012-03-02: 2 mg via INTRAVENOUS
  Filled 2012-03-02: qty 1

## 2012-03-02 MED ORDER — MUPIROCIN 2 % EX OINT
1.0000 "application " | TOPICAL_OINTMENT | Freq: Two times a day (BID) | CUTANEOUS | Status: DC
  Administered 2012-03-02 – 2012-03-06 (×9): 1 via NASAL
  Filled 2012-03-02: qty 22

## 2012-03-02 MED ORDER — PANTOPRAZOLE SODIUM 40 MG PO TBEC
40.0000 mg | DELAYED_RELEASE_TABLET | Freq: Every day | ORAL | Status: DC
  Administered 2012-03-02 – 2012-03-05 (×4): 40 mg via ORAL
  Filled 2012-03-02 (×4): qty 1

## 2012-03-02 MED ORDER — FLUCONAZOLE 100 MG PO TABS
100.0000 mg | ORAL_TABLET | Freq: Every day | ORAL | Status: AC
  Administered 2012-03-02 – 2012-03-03 (×2): 100 mg via ORAL
  Filled 2012-03-02 (×2): qty 1

## 2012-03-02 MED ORDER — SODIUM CHLORIDE 0.9 % IV SOLN
500.0000 mg | Freq: Every day | INTRAVENOUS | Status: AC
  Administered 2012-03-02 – 2012-03-06 (×5): 500 mg via INTRAVENOUS
  Filled 2012-03-02 (×5): qty 4

## 2012-03-02 NOTE — Progress Notes (Signed)
Observation review is complete. 

## 2012-03-02 NOTE — Progress Notes (Signed)
Pt has positive MRSA screen. Protocol orders began.

## 2012-03-02 NOTE — Progress Notes (Addendum)
Subjective: Feeling dizzy this morning. Feeling weak.  Objective: Vital signs in last 24 hours: Filed Vitals:   03/01/12 2314 03/01/12 2315 03/01/12 2332 03/02/12 0520  BP:   151/86 96/51  Pulse: 72 70 102 62  Temp:   98.1 F (36.7 C) 98.3 F (36.8 C)  TempSrc:   Oral Oral  Resp:   18 18  Height:   5\' 3"  (1.6 m)   Weight:   109.5 kg (241 lb 6.5 oz)   SpO2: 97% 98% 93% 95%   Weight change:   Intake/Output Summary (Last 24 hours) at 03/02/12 0936 Last data filed at 03/02/12 9147  Gross per 24 hour  Intake    360 ml  Output    600 ml  Net   -240 ml    Physical Exam: General: Awake, Oriented, No acute distress. HEENT: EOMI. Neck: Supple CV: S1 and S2 Lungs: Clear to ascultation bilaterally Abdomen: Soft, Nontender, Nondistended, +bowel sounds. Ext: Good pulses. Trace edema.  Lab Results: Basic Metabolic Panel:  Lab 03/01/12 8295 02/24/12 1044  NA 140 142  K 3.5 4.1  CL 107 107  CO2 25 28  GLUCOSE 81 96  BUN 10 11  CREATININE 0.72 0.85  CALCIUM 9.5 9.7  MG -- --  PHOS -- --   Liver Function Tests:  Lab 03/01/12 1653  AST 26  ALT 24  ALKPHOS 98  BILITOT 0.7  PROT 6.7  ALBUMIN 3.4*   No results found for this basename: LIPASE:5,AMYLASE:5 in the last 168 hours No results found for this basename: AMMONIA:5 in the last 168 hours CBC:  Lab 03/01/12 1653  WBC 7.2  NEUTROABS 2.8  HGB 13.4  HCT 38.7  MCV 83.9  PLT 228   Cardiac Enzymes:  Lab 03/01/12 1653  CKTOTAL 70  CKMB --  CKMBINDEX --  TROPONINI --   BNP (last 3 results) No results found for this basename: PROBNP:3 in the last 8760 hours CBG:  Lab 03/02/12 0817 03/01/12 2338  GLUCAP 126* 91    Basename 03/01/12 1645  HGBA1C 5.3   Other Labs: No components found with this basename: POCBNP:3 No results found for this basename: DDIMER:2 in the last 168 hours No results found for this basename: CHOL:2,HDL:2,LDLCALC:2,TRIG:2,CHOLHDL:2,LDLDIRECT:2 in the last 168 hours  Lab 02/24/12  1044  TSH 2.566  T4TOTAL --  T3FREE --  FREET4 --  THYROIDAB --   No results found for this basename: VITAMINB12:2,FOLATE:2,FERRITIN:2,TIBC:2,IRON:2,RETICCTPCT:2 in the last 168 hours  Micro Results: Recent Results (from the past 240 hour(s))  URINE CULTURE     Status: Normal   Collection Time   02/24/12  8:00 AM      Component Value Range Status Comment   Colony Count 3,000 COLONIES/ML   Final    Organism ID, Bacteria Insignificant Growth   Final   MRSA PCR SCREENING     Status: Abnormal   Collection Time   03/02/12 12:03 AM      Component Value Range Status Comment   MRSA by PCR POSITIVE (*) NEGATIVE Final     Studies/Results: Mr Brain Wo Contrast  03/02/2012  **ADDENDUM** CREATED: 03/02/2012 09:11:43  Correction in the body of the report and Impression: no contrast was administered to evaluate for post contrast enhancement.  **END ADDENDUM** SIGNED BY: Elsie Stain, M.D.   03/02/2012  *RADIOLOGY REPORT*  Clinical Data: Possible flare-up MS.  MRI HEAD WITHOUT CONTRAST  Technique:  Multiplanar, multiecho pulse sequences of the brain and surrounding structures were obtained according  to standard protocol without intravenous contrast.  Comparison: MRI brain 02/19/2009 and 12/24/2009.  Findings: No acute stroke, acute hemorrhage, or new white matter lesions compared with 2010 and 2011.  Nonspecific prominence of the callosal septal interface.  No restricted diffusion or abnormal post contrast enhancement.  Periventricular white matter signal abnormality similar to priors, consistent with the diagnosis of multiple sclerosis, given the cervical findings described separately. No foci of chronic hemorrhage, mass lesion, or hydrocephalus.  Slight premature atrophy for age 65.  Normal orbits, sinuses, and mastoids.  Negative pituitary and cerebellar tonsils.  IMPRESSION: Stable predominately periventricular white matter lesions consistent with chronic MS.  No restricted diffusion or abnormal  post contrast enhancement to suggest acute plaque activity.   Original Report Authenticated By: Elsie Stain, M.D.     Medications: I have reviewed the patient's current medications. Scheduled Meds:   . atorvastatin  80 mg Oral QHS  . Chlorhexidine Gluconate Cloth  6 each Topical Q0600  . cloNIDine  0.2 mg Oral BID  . diphenoxylate-atropine  1 tablet Oral BID  . DULoxetine  30 mg Oral Daily  . fluticasone  2 spray Each Nare Daily  . gabapentin  800 mg Oral TID  . heparin  5,000 Units Subcutaneous Q8H  . hydrALAZINE  100 mg Oral TID  . insulin aspart  0-15 Units Subcutaneous TID WC  . ketorolac  30 mg Intravenous Once  . LORazepam  1 mg Intravenous Once  . LORazepam  2 mg Intravenous Once  . losartan  100 mg Oral Daily  . metoprolol  50 mg Oral BID  . morphine  30 mg Oral BID  .  morphine injection  4 mg Intravenous Once  . mupirocin ointment  1 application Nasal BID  . NIFEdipine  30 mg Oral Daily  . nitrofurantoin (macrocrystal-monohydrate)  100 mg Oral BID  . pantoprazole  40 mg Oral Daily  . sodium chloride  3 mL Intravenous Q12H  . sucralfate  1 g Oral BID  . Tamsulosin HCl  0.4 mg Oral Daily  . topiramate  100 mg Oral Daily  . venlafaxine XR  37.5 mg Oral Daily  . DISCONTD: gabapentin  800 mg Oral TID  . DISCONTD: LORazepam  1 mg Intravenous Once   Continuous Infusions:  PRN Meds:.sodium chloride, ALPRAZolam, HYDROmorphone, sodium chloride, tiZANidine  Assessment/Plan: Possible MS flare vs chronic pain flare up due to being out of pain medications versus recent UTI Appreciate neurology input. MRI of brain and spine without contrast showed stable cord lesions consistent with chronic MS. MRI of brain and spine with contrast pending. Discuss with neurology start steroids. Will need PT evaluation. Will need outpatient evaluation with neurology.  Recent UTI Continue nitrofurantoin. Urine analysis on admission not suggestive of urinary tract infection.  H/o DM2 off of  meds at baseline  SSI. Watch for high blood sugars now that patient is on steroids.  HTN with hypotension. Stable. Continue home meds with   Morbid Obesity with possible obstructive sleep apnea Will arrange for outpatient sleep study.  Chronic pain Continue home medications.  Prophylaxis SQ Heparin  Disposition Pending neurology workup.   LOS: 1 day  Elven Laboy A, MD 03/02/2012, 9:36 AM

## 2012-03-02 NOTE — Progress Notes (Signed)
[facility] PROGRESS NOTE    SUBJECTIVE   Patient continues to complain of generalized weakness, horizontal diplopia and blurred vision.   OBJECTIVE   Vital signs in last 24 hours: Temp:  [97.9 F (36.6 C)-99.7 F (37.6 C)] 98.3 F (36.8 C) (10/16 0520) Pulse Rate:  [62-102] 62  (10/16 0520) Resp:  [16-20] 18  (10/16 0520) BP: (96-151)/(51-86) 96/51 mmHg (10/16 0520) SpO2:  [93 %-99 %] 95 % (10/16 0520) Weight:  [109.5 kg (241 lb 6.5 oz)] 109.5 kg (241 lb 6.5 oz) (10/15 2332)  Intake/Output from previous day: 10/15 0701 - 10/16 0700 In: 120 [P.O.:120] Out: 600 [Urine:600] Intake/Output this shift: Total I/O In: 240 [P.O.:240] Out: 275 [Urine:275] Nutritional status: General  Past Medical History  Diagnosis Date  . Multiple sclerosis exacerbation   . Hypertension   . Diabetes mellitus     Neurologic ROS negative with exception of above. Musculoskeletal ROS weak  Neurologic Exam:   Mental Status: Drowsy, oriented, thought content appropriate.  Speech fluent without evidence of aphasia.  Able to follow 3 step commands without difficulty. Tends to hold her eyes closed while talking to me but will open them on command and has no problems keeping them open when asked to.  Cranial Nerves: II: Discs flat bilaterally; Visual fields intact. Patient stating she has horizontal diplopia but when covering one eye the diplopia resolves.  She also states he vision is blurry in all visual fields. This occurs with and without one eye covered.  Pupils equal, round, reactive to light and accommodation III,IV, VI: ptosis present bilateral eyes left greater than right, extra-ocular motions intact bilaterally V,VII: smile symmetric, facial light touch sensation normal bilaterally VIII: hearing normal bilaterally IX,X: gag reflex present XI: bilateral shoulder shrug XII: midline tongue extension Motor: Right : Upper extremity    4/5    Left:     Upper extremity   4/5  Lower extremity   4/5     Lower extremity   4/5 --Generalized weak throughout, a component of give way weakness on left tricep extension and left dorsiflexion.  Tone and bulk:normal tone throughout; no atrophy noted Sensory: Pinprick and light touch intact throughout, bilaterally Deep Tendon Reflexes: 2+ and symmetric throughout Plantars: Right: downgoing   Left: downgoing Cerebellar: normal finger-to-nose,  normal heel-to-shin test CV: pulses palpable throughout    Lab Results: Lab Results  Component Value Date/Time   CHOL  Value: 237        ATP III CLASSIFICATION:  <200     mg/dL   Desirable  865-784  mg/dL   Borderline High  >=696    mg/dL   High       * 29/09/2839  3:25 AM   Lipid Panel No results found for this basename: CHOL,TRIG,HDL,CHOLHDL,VLDL,LDLCALC in the last 72 hours  Studies/Results: No results found.  Medications:     Prior to Admission:  Prescriptions prior to admission  Medication Sig Dispense Refill  . ALPRAZolam (XANAX) 1 MG tablet Take 1 tablet (1 mg total) by mouth 2 (two) times daily as needed. For anxiety.  30 tablet  2  . atorvastatin (LIPITOR) 80 MG tablet Take 1 tablet (80 mg total) by mouth daily.  90 tablet  3  . cloNIDine (CATAPRES) 0.2 MG tablet Take  1 tablet (0.2 mg total) by mouth 2 (two) times daily.  180 tablet  3  . diphenoxylate-atropine (LOMOTIL) 2.5-0.025 MG per tablet Take 1 tablet by mouth 2 (two) times daily. For diarrhea.  60 tablet  3  . DULoxetine (CYMBALTA) 30 MG capsule Take 30 mg by mouth daily.      . fluticasone (FLONASE) 50 MCG/ACT nasal spray Place 2 sprays into the nose daily.  16 g  11  . gabapentin (NEURONTIN) 800 MG tablet Take 1 tablet (800 mg total) by mouth 3 (three) times daily.  270 tablet  3  . hydrALAZINE (APRESOLINE) 100 MG tablet Take 100 mg by mouth 3 (three) times daily.      Marland Kitchen HYDROmorphone (DILAUDID) 8 MG tablet Take 8 mg by mouth every 4 (four) hours as needed. For pain.       Marland Kitchen LORazepam (ATIVAN) 2 MG tablet Take 1 tablet (2 mg total) by mouth at bedtime as needed. For sleep.  30 tablet  2  . losartan (COZAAR) 100 MG tablet Take 1 tablet (100 mg total) by mouth daily.  90 tablet  3  . metoprolol (LOPRESSOR) 50 MG tablet Take 1 tablet (50 mg total) by mouth 2 (two) times daily.  180 tablet  3  . morphine (MS CONTIN) 30 MG 12 hr tablet Take 1 tablet (30 mg total) by mouth 2 (two) times daily.  60 tablet  0  . Natalizumab (TYSABRI IV) Inject 300 mg into the vein every 30 (thirty) days.      Marland Kitchen NIFEdipine (PROCARDIA-XL/ADALAT-CC/NIFEDICAL-XL) 30 MG 24 hr tablet Take 30 mg by mouth daily.      . nitrofurantoin, macrocrystal-monohydrate, (MACROBID) 100 MG capsule Take 1 capsule (100 mg total) by mouth 2 (two) times daily.  14 capsule  0  . omeprazole (PRILOSEC) 20 MG capsule Take 1 capsule (20 mg total) by mouth daily.  90 capsule  3  . sucralfate (CARAFATE) 1 G tablet Take 1 tablet (1 g total) by mouth 2 (two) times daily.  90 tablet  3  . Tamsulosin HCl (FLOMAX) 0.4 MG CAPS Take 1 capsule (0.4 mg total) by mouth daily.  90 capsule  3  . tiZANidine (ZANAFLEX) 4 MG tablet Take 1 tablet (4 mg total) by mouth every 8 (eight) hours as needed. For muscle spasm.  30 tablet  0  . topiramate (TOPAMAX) 100 MG tablet Take 1 tablet (100 mg total) by mouth daily.  90 tablet  3  . venlafaxine XR (EFFEXOR-XR) 37.5 MG 24 hr capsule Take 1 capsule (37.5 mg total) by mouth daily.  90 capsule  3   Scheduled:   . atorvastatin  80 mg Oral QHS  . Chlorhexidine Gluconate Cloth  6 each Topical Q0600  . cloNIDine  0.2 mg Oral BID  . diphenoxylate-atropine  1 tablet Oral BID  . DULoxetine  30 mg Oral Daily  . fluticasone  2 spray Each Nare Daily  . gabapentin  800 mg Oral TID  . heparin  5,000 Units Subcutaneous Q8H  . hydrALAZINE  100 mg Oral TID  . insulin aspart  0-15 Units Subcutaneous TID WC  . ketorolac  30 mg Intravenous Once  . LORazepam  1 mg Intravenous Once  . LORazepam  1 mg  Intravenous Once  . LORazepam  2 mg Intravenous Once  . losartan  100 mg Oral Daily  . metoprolol  50 mg Oral BID  . morphine  30 mg Oral BID  .  morphine injection  4 mg Intravenous Once  . mupirocin ointment  1 application Nasal BID  . NIFEdipine  30 mg Oral Daily  . nitrofurantoin (macrocrystal-monohydrate)  100 mg Oral BID  . pantoprazole  40 mg Oral Daily  . sodium chloride  3 mL Intravenous Q12H  . sucralfate  1 g Oral BID  . Tamsulosin HCl  0.4 mg Oral Daily  . topiramate  100 mg Oral Daily  . venlafaxine XR  37.5 mg Oral Daily  . DISCONTD: gabapentin  800 mg Oral TID    Assessment/Plan:    Patient Active Hospital Problem List:  Multiple sclerosis/left sided weakness (12/09/2007)   Assessment: Continues to be weak throughout with left greater than right. continues to have component of give way weakness on left dorsiflexion. MRI without contrast head and C-spine was obtained but patient was claustrophobic and will need to be taken back to MRI today to finish scan with contrast and obtain T-spine.  Patient does have positive yeast in wet prep and no growth in urine culture.    Plan:  1) MRI T-spine and finish brain and C-spine with contrast 2) If MRI positive for enhancing lesion would give IV solumedrol, 500mg  BID x 5 days 3) Would continue antispasmodics(zanaflex, gabapentin) at home doses.  4) Recommend obtaining records from previous neurologist  Discussed with Dr. Carlisle Cater PA-C [facility] [phone]  03/02/2012, 8:18 AM

## 2012-03-02 NOTE — Plan of Care (Signed)
Problem: Phase I Progression Outcomes Goal: Pain controlled with appropriate interventions Outcome: Progressing Pt will acknowledge pain relief with appropriate pain med and interventions.

## 2012-03-02 NOTE — Progress Notes (Signed)
INITIAL ADULT NUTRITION ASSESSMENT Date: 03/02/2012   Time: 4:33 PM Reason for Assessment: Nutrition risk   INTERVENTION: Encouraged continued excellent intake. Will monitor.   ASSESSMENT: Female 51 y.o.  Dx: MS flare up   Food/Nutrition Related Hx: Pt reports poor appetite for the past month with 7 pound unintended weight loss. Pt reports she has been trying to eat 1 meal/day. Pt reports her blood sugars have been under control at home. Pt reports appetite improved today with pt consuming 100% of breakfast and lunch.   Hx:  Past Medical History  Diagnosis Date  . Multiple sclerosis exacerbation   . Hypertension   . Diabetes mellitus    Related Meds:  Scheduled Meds:   . atorvastatin  80 mg Oral QHS  . Chlorhexidine Gluconate Cloth  6 each Topical Q0600  . cloNIDine  0.2 mg Oral BID  . diphenoxylate-atropine  1 tablet Oral BID  . DULoxetine  30 mg Oral QHS  . fluconazole  100 mg Oral Daily  . fluticasone  2 spray Each Nare QHS  . gabapentin  800 mg Oral TID  . heparin  5,000 Units Subcutaneous Q8H  . hydrALAZINE  100 mg Oral TID  . insulin aspart  0-15 Units Subcutaneous TID WC  . ketorolac  30 mg Intravenous Once  . LORazepam  1 mg Intravenous Once  . LORazepam  2 mg Intravenous Once  . losartan  100 mg Oral QHS  . methylPREDNISolone (SOLU-MEDROL) injection  500 mg Intravenous Daily  . metoprolol  50 mg Oral BID  . morphine  30 mg Oral BID  .  morphine injection  4 mg Intravenous Once  . mupirocin ointment  1 application Nasal BID  . NIFEdipine  30 mg Oral QHS  . nitrofurantoin (macrocrystal-monohydrate)  100 mg Oral BID  . pantoprazole  40 mg Oral QHS  . sodium chloride  3 mL Intravenous Q12H  . sucralfate  1 g Oral BID  . Tamsulosin HCl  0.4 mg Oral QHS  . topiramate  100 mg Oral QHS  . venlafaxine XR  37.5 mg Oral QHS  . DISCONTD: DULoxetine  30 mg Oral Daily  . DISCONTD: fluticasone  2 spray Each Nare Daily  . DISCONTD: gabapentin  800 mg Oral TID  .  DISCONTD: insulin aspart  0-15 Units Subcutaneous TID WC  . DISCONTD: LORazepam  1 mg Intravenous Once  . DISCONTD: losartan  100 mg Oral Daily  . DISCONTD: NIFEdipine  30 mg Oral Daily  . DISCONTD: pantoprazole  40 mg Oral Daily  . DISCONTD: Tamsulosin HCl  0.4 mg Oral Daily  . DISCONTD: topiramate  100 mg Oral Daily  . DISCONTD: venlafaxine XR  37.5 mg Oral Daily   Continuous Infusions:  PRN Meds:.sodium chloride, ALPRAZolam, gadobenate dimeglumine, HYDROmorphone, sodium chloride, tiZANidine  Ht: 5\' 3"  (160 cm)  Wt: 241 lb 6.5 oz (109.5 kg)  Ideal Wt: 115 lb % Ideal Wt: 209  Usual Wt: 248 lb per pt report % Usual Wt: 97   Body mass index is 42.76 kg/(m^2). Class III obesity    Labs:  CMP     Component Value Date/Time   NA 140 03/01/2012 1653   K 3.5 03/01/2012 1653   CL 107 03/01/2012 1653   CO2 25 03/01/2012 1653   GLUCOSE 81 03/01/2012 1653   BUN 10 03/01/2012 1653   CREATININE 0.72 03/01/2012 1653   CREATININE 0.85 02/24/2012 1044   CALCIUM 9.5 03/01/2012 1653   PROT 6.7 03/01/2012 1653  ALBUMIN 3.4* 03/01/2012 1653   AST 26 03/01/2012 1653   ALT 24 03/01/2012 1653   ALKPHOS 98 03/01/2012 1653   BILITOT 0.7 03/01/2012 1653   GFRNONAA >90 03/01/2012 1653   GFRAA >90 03/01/2012 1653   Lab Results  Component Value Date   HGBA1C 5.3 03/01/2012   CBG (last 3)   Basename 03/02/12 1222 03/02/12 0817 03/01/12 2338  GLUCAP 102* 126* 91    Intake/Output Summary (Last 24 hours) at 03/02/12 1638 Last data filed at 03/02/12 1540  Gross per 24 hour  Intake    960 ml  Output   1350 ml  Net   -390 ml   Last BM - 10/15  Diet Order: Carb Control   IVF:    Estimated Nutritional Needs:   Kcal:1550-1700 Protein:55-65g Fluid:1.5-1.7L  NUTRITION DIAGNOSIS: Unintended weight loss Status: Ongoing  RELATED TO: poor appetite PTA  AS EVIDENCE BY: pt statement  MONITORING/EVALUATION(Goals): 1. Pt to consume >90% of meals 2. Weight maintenance/prevent  further weight loss  EDUCATION NEEDS: -No education needs identified at this time   Dietitian #: [phone]  DOCUMENTATION CODES Per approved criteria  -Morbid Obesity    Marshall Cork 03/02/2012, 4:33 PM

## 2012-03-03 DIAGNOSIS — K219 Gastro-esophageal reflux disease without esophagitis: Secondary | ICD-10-CM

## 2012-03-03 DIAGNOSIS — E7800 Pure hypercholesterolemia, unspecified: Secondary | ICD-10-CM

## 2012-03-03 LAB — GLUCOSE, CAPILLARY
Glucose-Capillary: 246 mg/dL — ABNORMAL HIGH (ref 70–99)
Glucose-Capillary: 135 mg/dL — ABNORMAL HIGH (ref 70–99)
Glucose-Capillary: 160 mg/dL — ABNORMAL HIGH (ref 70–99)

## 2012-03-03 MED ORDER — INSULIN ASPART 100 UNIT/ML ~~LOC~~ SOLN
0.0000 [IU] | Freq: Every day | SUBCUTANEOUS | Status: DC
  Administered 2012-03-04: 2 [IU] via SUBCUTANEOUS
  Administered 2012-03-05: 3 [IU] via SUBCUTANEOUS

## 2012-03-03 MED ORDER — NITROFURANTOIN MONOHYD MACRO 100 MG PO CAPS
100.0000 mg | ORAL_CAPSULE | Freq: Two times a day (BID) | ORAL | Status: DC
  Administered 2012-03-03 – 2012-03-06 (×6): 100 mg via ORAL
  Filled 2012-03-03 (×7): qty 1

## 2012-03-03 MED ORDER — INSULIN ASPART 100 UNIT/ML ~~LOC~~ SOLN
0.0000 [IU] | Freq: Three times a day (TID) | SUBCUTANEOUS | Status: DC
  Administered 2012-03-03: 4 [IU] via SUBCUTANEOUS
  Administered 2012-03-03 – 2012-03-04 (×3): 3 [IU] via SUBCUTANEOUS
  Administered 2012-03-04 – 2012-03-05 (×2): 4 [IU] via SUBCUTANEOUS
  Administered 2012-03-05: 11 [IU] via SUBCUTANEOUS
  Administered 2012-03-05 – 2012-03-06 (×2): 3 [IU] via SUBCUTANEOUS

## 2012-03-03 NOTE — Progress Notes (Signed)
Subjective: Feeling weak, but improved from admission. No specific complaints.  Objective: Vital signs in last 24 hours: Filed Vitals:   03/02/12 1415 03/02/12 1539 03/02/12 2155 03/03/12 0614  BP: 117/52 107/64 123/73 128/67  Pulse: 64 72 76 80  Temp: 98.2 F (36.8 C) 98.4 F (36.9 C) 98.7 F (37.1 C) 97.5 F (36.4 C)  TempSrc: Oral Oral Oral Oral  Resp: 17 16 18 20   Height:      Weight:      SpO2: 96% 94% 96% 99%   Weight change:   Intake/Output Summary (Last 24 hours) at 03/03/12 1033 Last data filed at 03/03/12 0850  Gross per 24 hour  Intake   1330 ml  Output   2250 ml  Net   -920 ml    Physical Exam: General: Awake, Oriented, No acute distress. HEENT: EOMI. Neck: Supple CV: S1 and S2 Lungs: Clear to ascultation bilaterally Abdomen: Soft, Nontender, Nondistended, +bowel sounds. Ext: Good pulses. Trace edema.  Lab Results: Basic Metabolic Panel:  Lab 03/01/12 1610  NA 140  K 3.5  CL 107  CO2 25  GLUCOSE 81  BUN 10  CREATININE 0.72  CALCIUM 9.5  MG --  PHOS --   Liver Function Tests:  Lab 03/01/12 1653  AST 26  ALT 24  ALKPHOS 98  BILITOT 0.7  PROT 6.7  ALBUMIN 3.4*   No results found for this basename: LIPASE:5,AMYLASE:5 in the last 168 hours No results found for this basename: AMMONIA:5 in the last 168 hours CBC:  Lab 03/01/12 1653  WBC 7.2  NEUTROABS 2.8  HGB 13.4  HCT 38.7  MCV 83.9  PLT 228   Cardiac Enzymes:  Lab 03/01/12 1653  CKTOTAL 70  CKMB --  CKMBINDEX --  TROPONINI --   BNP (last 3 results) No results found for this basename: PROBNP:3 in the last 8760 hours CBG:  Lab 03/03/12 0740 03/02/12 2204 03/02/12 1653 03/02/12 1222 03/02/12 0817  GLUCAP 246* 191* 130* 102* 126*    Basename 03/01/12 1645  HGBA1C 5.3   Other Labs: No components found with this basename: POCBNP:3 No results found for this basename: DDIMER:2 in the last 168 hours No results found for this basename:  CHOL:2,HDL:2,LDLCALC:2,TRIG:2,CHOLHDL:2,LDLDIRECT:2 in the last 168 hours No results found for this basename: TSH,T4TOTAL,FREET3,T3FREE,FREET4,THYROIDAB in the last 168 hours No results found for this basename: VITAMINB12:2,FOLATE:2,FERRITIN:2,TIBC:2,IRON:2,RETICCTPCT:2 in the last 168 hours  Micro Results: Recent Results (from the past 240 hour(s))  URINE CULTURE     Status: Normal   Collection Time   02/24/12  8:00 AM      Component Value Range Status Comment   Colony Count 3,000 COLONIES/ML   Final    Organism ID, Bacteria Insignificant Growth   Final   MRSA PCR SCREENING     Status: Abnormal   Collection Time   03/02/12 12:03 AM      Component Value Range Status Comment   MRSA by PCR POSITIVE (*) NEGATIVE Final     Studies/Results: Mr Brain Wo Contrast  03/02/2012  **ADDENDUM** CREATED: 03/02/2012 09:11:43  Correction in the body of the report and Impression: no contrast was administered to evaluate for post contrast enhancement.  **END ADDENDUM** SIGNED BY: Elsie Stain, M.D.   03/02/2012  *RADIOLOGY REPORT*  Clinical Data: Possible flare-up MS.  MRI HEAD WITHOUT CONTRAST  Technique:  Multiplanar, multiecho pulse sequences of the brain and surrounding structures were obtained according to standard protocol without intravenous contrast.  Comparison: MRI brain 02/19/2009 and 12/24/2009.  Findings: No acute stroke, acute hemorrhage, or new white matter lesions compared with 2010 and 2011.  Nonspecific prominence of the callosal septal interface.  No restricted diffusion or abnormal post contrast enhancement.  Periventricular white matter signal abnormality similar to priors, consistent with the diagnosis of multiple sclerosis, given the cervical findings described separately. No foci of chronic hemorrhage, mass lesion, or hydrocephalus.  Slight premature atrophy for age 59.  Normal orbits, sinuses, and mastoids.  Negative pituitary and cerebellar tonsils.  IMPRESSION: Stable predominately  periventricular white matter lesions consistent with chronic MS.  No restricted diffusion or abnormal post contrast enhancement to suggest acute plaque activity.   Original Report Authenticated By: Elsie Stain, M.D.    Mr Laqueta Jean Contrast  03/02/2012  *RADIOLOGY REPORT*  Clinical Data: Known multiple sclerosis.  Evaluate for intracranial enhancing lesion.  MRI HEAD WITH CONTRAST  Technique:  Multiplanar, multiecho pulse sequences of the brain and surrounding structures were obtained according to standard protocol with intravenous contrast  Contrast:  MultiHance 20 ml.  Comparison: Noncontrast scan performed 03/01/2012.  Findings: No abnormal intracranial enhancement of the brain or meninges.  No other white matter lesions show blood brain barrier breakdown. Normal appearing extracranial soft tissues. No optic nerve lesion.  IMPRESSION: No abnormal intracranial enhancement.  No evidence for acute plaque activity in this patient with known MS.   Original Report Authenticated By: Elsie Stain, M.D.    Mr Cervical Spine Wo Contrast  03/02/2012  *RADIOLOGY REPORT*  Clinical Data: Chronic MS, evaluate for acute activity.  MRI CERVICAL SPINE WITHOUT CONTRAST  Technique:  Multiplanar and multiecho pulse sequences of the cervical spine, to include the craniocervical junction and cervicothoracic junction, were obtained according to standard protocol without intravenous contrast.  Comparison: 03/02/2009.  Findings: Prominence of ventral cord signal opposite C3-C4 remains, with slight cord atrophy. Midline abnormal dorsal cord signal opposite the C2 vertebral body redemonstrated and also stable.  No new cervical spine lesions.  Mild reversal normal cervical lordotic curve. Mild osteophytic ridging C4-C5 without significant neural impingement.  Disc space narrowing C5-6 and C6-7. Shallow central protrusion at C5-6 central to the right and C6-7 central to the right are similar to priors. Left-sided neural foraminal  narrowing C5-6 persists due to uncinate spurring.  IMPRESSION: Stable cord lesions consistent with chronic MS.  Stable chronic spondylosis C4-C7.   Original Report Authenticated By: Elsie Stain, M.D.    Mr Cervical Spine W Contrast  03/02/2012  *RADIOLOGY REPORT*  Clinical Data: MS flare-up.  MRI CERVICAL SPINE WITH CONTRAST  Technique:  Multiplanar and multiecho pulse sequences of the cervical spine, to include the craniocervical junction and cervicothoracic junction, were obtained according to standard protocol with intravenous contrast.  Contrast:  MultiHance 20 ml.  Comparison: Noncontrast MRI performed yesterday.  Findings: The patient returned for contrast after sedation.  There is no abnormal enhancement of the spine, intraspinal contents, or cord. Special attention is directed to the previously identified hyperintense lesions at C3-4 and C2, and do not display significant enhancement.  IMPRESSION: No evidence for abnormal cervical cord enhancement to suggest acute MS plaque.   Original Report Authenticated By: Elsie Stain, M.D.    Mr Thoracic Spine W Wo Contrast  03/02/2012  *RADIOLOGY REPORT*  Clinical Data: Difficulty walking.  Known multiple sclerosis.  MRI THORACIC SPINE WITHOUT AND WITH CONTRAST  Technique:  Multiplanar and multiecho pulse sequences of the thoracic spine were obtained without and with intravenous contrast.  Contrast: 20mL MULTIHANCE GADOBENATE DIMEGLUMINE  529 MG/ML IV SOLN  Comparison: Thoracic spine MRI from 2019.  Findings: The vertebral bodies demonstrate normal alignment and normal marrow signal except for a few small scattered hemangiomas. There is a stable area of increased signal intensity in the thoracic spinal cord at the T6 level with mild cord atrophy.  No enhancement is demonstrated.  No new lesions are seen.  No enhancing lesions.  The facets are normally aligned.  No paraspinal process.  IMPRESSION:  1.  Stable T6 cord lesion, likely MS plaque with mild cord  atrophy. No definite new thoracic cord lesions or abnormal areas of enhancement. 2.  No significant thoracic disc protrusions, spinal or foraminal stenosis.   Original Report Authenticated By: P. Loralie Champagne, M.D.     Medications: I have reviewed the patient's current medications. Scheduled Meds:    . atorvastatin  80 mg Oral QHS  . Chlorhexidine Gluconate Cloth  6 each Topical Q0600  . cloNIDine  0.2 mg Oral BID  . diphenoxylate-atropine  1 tablet Oral BID  . DULoxetine  30 mg Oral QHS  . fluconazole  100 mg Oral Daily  . fluticasone  2 spray Each Nare QHS  . gabapentin  800 mg Oral TID  . heparin  5,000 Units Subcutaneous Q8H  . hydrALAZINE  100 mg Oral TID  . insulin aspart  0-20 Units Subcutaneous TID WC  . insulin aspart  0-5 Units Subcutaneous QHS  . LORazepam  2 mg Intravenous Once  . losartan  100 mg Oral QHS  . methylPREDNISolone (SOLU-MEDROL) injection  500 mg Intravenous Daily  . metoprolol  50 mg Oral BID  . morphine  30 mg Oral BID  . mupirocin ointment  1 application Nasal BID  . NIFEdipine  30 mg Oral QHS  . nitrofurantoin (macrocrystal-monohydrate)  100 mg Oral BID  . pantoprazole  40 mg Oral QHS  . sodium chloride  3 mL Intravenous Q12H  . sucralfate  1 g Oral BID  . Tamsulosin HCl  0.4 mg Oral QHS  . topiramate  100 mg Oral QHS  . venlafaxine XR  37.5 mg Oral QHS  . DISCONTD: insulin aspart  0-15 Units Subcutaneous TID WC   Continuous Infusions:  PRN Meds:.sodium chloride, ALPRAZolam, gadobenate dimeglumine, HYDROmorphone, sodium chloride, tiZANidine  Assessment/Plan: Possible MS flare vs chronic pain flare up due to being out of pain medications versus recent UTI Appreciate neurology input. MRI of brain and spine without contrast showed stable cord lesions consistent with chronic MS. MRI of brain and spine with contrast on 03/02/2012 showed no acute evidence for MS. Discuss with neurology, acute MS may not showup on MRI, neurology recommended treating  with 5 day course of IV steroids (till 03/06/2012). PT evaluation noted no PT needs. Will need outpatient evaluation with neurology for monthly tysabri infusion.  Recent UTI UA positive prior to admission, continue nitrofurantoin, defined 7 days total as outpatient till 03/06/2012. Urine analysis on admission not suggestive of urinary tract infection. Fluconazole for prophylaxis to prevent superimposed yeast infection.  H/o DM2 off of meds at baseline  SSI resistant scale. Hemoglobin A1C 5.3 prior to admission, not diabetic. Suspect would not need any diabetic medications after discharge.  HTN Hypotension resolved. Stable. Continue home meds with hold parameters.  Morbid Obesity with possible obstructive sleep apnea Will arrange for outpatient sleep study.  Chronic pain Continue home medications.  Prophylaxis SQ Heparin  Disposition Pending neurology workup.   LOS: 2 days  Keylan Costabile A, MD 03/03/2012, 10:33 AM

## 2012-03-03 NOTE — Progress Notes (Signed)
[facility] PROGRESS NOTE    SUBJECTIVE   Vision has improved. She still feels weak bilateral LE left greater than right.  She states she is chronically weak on the left and often times will be weaker on left when she is sick of has a cold.  She also has a pain component on left leg which she states is chronic.   OBJECTIVE   Vital signs in last 24 hours: Temp:  [97.5 F (36.4 C)-98.7 F (37.1 C)] 97.5 F (36.4 C) (10/17 1610) Pulse Rate:  [64-80] 80  (10/17 0614) Resp:  [16-20] 20  (10/17 0614) BP: (85-128)/(50-73) 128/67 mmHg (10/17 0614) SpO2:  [94 %-99 %] 99 % (10/17 0614)  Intake/Output from previous day: 10/16 0701 - 10/17 0700 In: 1330 [P.O.:1320; I.V.:10] Out: 2600 [Urine:2600] Intake/Output this shift:   Nutritional status: General  Past Medical History  Diagnosis Date  . Multiple sclerosis exacerbation   . Hypertension   . Diabetes mellitus       Neurologic Exam:   Mental Status: Alert, oriented, thought content appropriate.  Speech fluent without evidence of aphasia.  Able to follow 3 step commands without difficulty. Cranial Nerves: II: Visual fields grossly normal, pupils equal, round, reactive to light and accommodation III,IV, VI: ptosis not present, extra-ocular motions intact bilaterally V,VII: smile symmetric, facial light touch sensation normal bilaterally VIII: hearing normal bilaterally IX,X: gag reflex present XI: bilateral shoulder shrug XII: midline tongue extension Motor: Right : Upper extremity   5/5    Left:     Upper extremity   4/5 (inconsistant effort)  Lower extremity   5/5     Lower extremity   4/5 (inconsistant effort)  Tone and bulk:normal tone throughout; no atrophy noted Sensory: Pinprick and light touch intact throughout, bilaterally Deep Tendon Reflexes: 2+ and symmetric throughout Plantars: Right: downgoing   Left: downgoing Cerebellar: normal finger-to-nose,  normal  heel-to-shin test CV: pulses palpable throughout    Lab Results: Lab Results  Component Value Date/Time   CHOL  Value: 237        ATP III CLASSIFICATION:  <200     mg/dL   Desirable  960-454  mg/dL   Borderline High  >=098    mg/dL   High       * 03/26/1477  3:25 AM   Lipid Panel No results found for this basename: CHOL,TRIG,HDL,CHOLHDL,VLDL,LDLCALC in the last 72 hours  Studies/Results: Mr Brain Wo Contrast  03/02/2012  **ADDENDUM** CREATED: 03/02/2012 09:11:43  Correction in the body of the report and Impression: no contrast was administered to evaluate for post contrast enhancement.  **END ADDENDUM** SIGNED BY: Elsie Stain, M.D.   03/02/2012  *RADIOLOGY REPORT*  Clinical Data: Possible flare-up MS.  MRI HEAD WITHOUT CONTRAST  Technique:  Multiplanar, multiecho pulse sequences of the brain and surrounding structures were obtained according to standard protocol without intravenous contrast.  Comparison: MRI brain 02/19/2009 and 12/24/2009.  Findings: No acute stroke, acute hemorrhage, or new white matter lesions compared with 2010 and 2011.  Nonspecific prominence of the callosal septal interface.  No restricted diffusion or abnormal post contrast enhancement.  Periventricular white matter signal abnormality similar to priors, consistent with the diagnosis of multiple sclerosis, given the cervical findings described separately. No foci of chronic hemorrhage, mass  lesion, or hydrocephalus.  Slight premature atrophy for age 24.  Normal orbits, sinuses, and mastoids.  Negative pituitary and cerebellar tonsils.  IMPRESSION: Stable predominately periventricular white matter lesions consistent with chronic MS.  No restricted diffusion or abnormal post contrast enhancement to suggest acute plaque activity.   Original Report Authenticated By: Elsie Stain, M.D.    Mr Laqueta Jean Contrast  03/02/2012  *RADIOLOGY REPORT*  Clinical Data: Known multiple sclerosis.  Evaluate for intracranial enhancing  lesion.  MRI HEAD WITH CONTRAST  Technique:  Multiplanar, multiecho pulse sequences of the brain and surrounding structures were obtained according to standard protocol with intravenous contrast  Contrast:  MultiHance 20 ml.  Comparison: Noncontrast scan performed 03/01/2012.  Findings: No abnormal intracranial enhancement of the brain or meninges.  No other white matter lesions show blood brain barrier breakdown. Normal appearing extracranial soft tissues. No optic nerve lesion.  IMPRESSION: No abnormal intracranial enhancement.  No evidence for acute plaque activity in this patient with known MS.   Original Report Authenticated By: Elsie Stain, M.D.    Mr Cervical Spine Wo Contrast  03/02/2012  *RADIOLOGY REPORT*  Clinical Data: Chronic MS, evaluate for acute activity.  MRI CERVICAL SPINE WITHOUT CONTRAST  Technique:  Multiplanar and multiecho pulse sequences of the cervical spine, to include the craniocervical junction and cervicothoracic junction, were obtained according to standard protocol without intravenous contrast.  Comparison: 03/02/2009.  Findings: Prominence of ventral cord signal opposite C3-C4 remains, with slight cord atrophy. Midline abnormal dorsal cord signal opposite the C2 vertebral body redemonstrated and also stable.  No new cervical spine lesions.  Mild reversal normal cervical lordotic curve. Mild osteophytic ridging C4-C5 without significant neural impingement.  Disc space narrowing C5-6 and C6-7. Shallow central protrusion at C5-6 central to the right and C6-7 central to the right are similar to priors. Left-sided neural foraminal narrowing C5-6 persists due to uncinate spurring.  IMPRESSION: Stable cord lesions consistent with chronic MS.  Stable chronic spondylosis C4-C7.   Original Report Authenticated By: Elsie Stain, M.D.    Mr Cervical Spine W Contrast  03/02/2012  *RADIOLOGY REPORT*  Clinical Data: MS flare-up.  MRI CERVICAL SPINE WITH CONTRAST  Technique:  Multiplanar  and multiecho pulse sequences of the cervical spine, to include the craniocervical junction and cervicothoracic junction, were obtained according to standard protocol with intravenous contrast.  Contrast:  MultiHance 20 ml.  Comparison: Noncontrast MRI performed yesterday.  Findings: The patient returned for contrast after sedation.  There is no abnormal enhancement of the spine, intraspinal contents, or cord. Special attention is directed to the previously identified hyperintense lesions at C3-4 and C2, and do not display significant enhancement.  IMPRESSION: No evidence for abnormal cervical cord enhancement to suggest acute MS plaque.   Original Report Authenticated By: Elsie Stain, M.D.    Mr Thoracic Spine W Wo Contrast  03/02/2012  *RADIOLOGY REPORT*  Clinical Data: Difficulty walking.  Known multiple sclerosis.  MRI THORACIC SPINE WITHOUT AND WITH CONTRAST  Technique:  Multiplanar and multiecho pulse sequences of the thoracic spine were obtained without and with intravenous contrast.  Contrast: 20mL MULTIHANCE GADOBENATE DIMEGLUMINE 529 MG/ML IV SOLN  Comparison: Thoracic spine MRI from 2019.  Findings: The vertebral bodies demonstrate normal alignment and normal marrow signal except for a few small scattered hemangiomas. There is a stable area of increased signal intensity in the thoracic spinal cord at the T6 level with mild cord atrophy.  No enhancement is demonstrated.  No new lesions  are seen.  No enhancing lesions.  The facets are normally aligned.  No paraspinal process.  IMPRESSION:  1.  Stable T6 cord lesion, likely MS plaque with mild cord atrophy. No definite new thoracic cord lesions or abnormal areas of enhancement. 2.  No significant thoracic disc protrusions, spinal or foraminal stenosis.   Original Report Authenticated By: P. Loralie Champagne, M.D.     Medications:     Scheduled:   . atorvastatin  80 mg Oral QHS  . Chlorhexidine Gluconate Cloth  6 each Topical Q0600  . cloNIDine   0.2 mg Oral BID  . diphenoxylate-atropine  1 tablet Oral BID  . DULoxetine  30 mg Oral QHS  . fluconazole  100 mg Oral Daily  . fluticasone  2 spray Each Nare QHS  . gabapentin  800 mg Oral TID  . heparin  5,000 Units Subcutaneous Q8H  . hydrALAZINE  100 mg Oral TID  . insulin aspart  0-15 Units Subcutaneous TID WC  . LORazepam  2 mg Intravenous Once  . losartan  100 mg Oral QHS  . methylPREDNISolone (SOLU-MEDROL) injection  500 mg Intravenous Daily  . metoprolol  50 mg Oral BID  . morphine  30 mg Oral BID  . mupirocin ointment  1 application Nasal BID  . NIFEdipine  30 mg Oral QHS  . nitrofurantoin (macrocrystal-monohydrate)  100 mg Oral BID  . pantoprazole  40 mg Oral QHS  . sodium chloride  3 mL Intravenous Q12H  . sucralfate  1 g Oral BID  . Tamsulosin HCl  0.4 mg Oral QHS  . topiramate  100 mg Oral QHS  . venlafaxine XR  37.5 mg Oral QHS  . DISCONTD: DULoxetine  30 mg Oral Daily  . DISCONTD: fluticasone  2 spray Each Nare Daily  . DISCONTD: insulin aspart  0-15 Units Subcutaneous TID WC  . DISCONTD: losartan  100 mg Oral Daily  . DISCONTD: NIFEdipine  30 mg Oral Daily  . DISCONTD: pantoprazole  40 mg Oral Daily  . DISCONTD: Tamsulosin HCl  0.4 mg Oral Daily  . DISCONTD: topiramate  100 mg Oral Daily  . DISCONTD: venlafaxine XR  37.5 mg Oral Daily    Assessment/Plan:    Patient Active Hospital Problem List:  Multiple sclerosis/Plan (12/09/2007)   Assessment: MS exacerbation     Plan:Continues with Left arm and leg weakness, no change from yesterday. Inconsistent with effort but when giving full effort I get a 4/4 in left leg and 4+/5-5/5 in left arm. MRI brain, C-spine and T-spin shows no active lesions. Will continue with the full 5 full doses of Solumedrol daily ( she has received 2 doses to date).  Patient is on Protonix and SSI.       Felicie Morn PA-C [facility] [phone]  03/03/2012, 8:34 AM

## 2012-03-03 NOTE — Progress Notes (Signed)
Request for records has been faxed to office of Bhavik L. Willey Blade MD, neurology in [place], [place]. Awaiting fax from their office. Fax: [phone].  Phone: [phone]

## 2012-03-03 NOTE — Evaluation (Signed)
Physical Therapy Evaluation Patient Details Name: [name] MRN: [id number] DOB: 09-Nov-1960 Today's Date: 03/03/2012 Time: 4098-1191 PT Time Calculation (min): 27 min  PT Assessment / Plan / Recommendation Clinical Impression  pt with adm with possible MS flare; MRI negative for acute changes;  pt states she feels she is getting close to baseline;  Pt states she will do  whatever she wants to at home; No further PT needs.    PT Assessment  Patent does not need any further PT services    Follow Up Recommendations  No PT follow up    Does the patient have the potential to tolerate intense rehabilitation      Barriers to Discharge        Equipment Recommendations  None recommended by PT    Recommendations for Other Services     Frequency      Precautions / Restrictions Precautions Precautions: None   Pertinent Vitals/Pain       Mobility  Bed Mobility Bed Mobility: Supine to Sit Supine to Sit: 5: Supervision;HOB elevated Transfers Transfers: Sit to Stand;Stand to Sit Sit to Stand: 5: Supervision Stand to Sit: 5: Supervision Details for Transfer Assistance: safety and IV Ambulation/Gait Ambulation/Gait Assistance: 5: Supervision Ambulation Distance (Feet): 100 Feet Assistive device: Rolling walker;1 person hand held assist;2 person hand held assist;None Ambulation/Gait Assistance Details: cues for wt shift and intial sequence Gait Pattern: Step-through pattern    Shoulder Instructions     Exercises     PT Diagnosis:    PT Problem List:   PT Treatment Interventions:     PT Goals    Visit Information  Last PT Received On: 03/03/12 Assistance Needed: +1    Subjective Data  Subjective: i have always been weak on the left side Patient Stated Goal: return to PLOF   Prior Functioning  Home Living Lives With: Family Available Help at Discharge: Available 24 hours/day Type of Home: House Home Access: Stairs to enter [employer] of Steps:  2 Entrance Stairs-Rails: None Home Layout: One level Home Adaptive Equipment: Straight cane;Walker - rolling Prior Function Level of Independence: Independent;Independent with assistive device(s) (cane when has  flare ups) Able to Take Stairs?: Yes Communication Communication: No difficulties    Cognition  Overall Cognitive Status: Appears within functional limits for tasks assessed/performed Arousal/Alertness: Awake/alert Orientation Level: Appears intact for tasks assessed Behavior During Session: [facility] for tasks performed    Extremity/Trunk Assessment Right Upper Extremity Assessment RUE ROM/Strength/Tone: [facility] for tasks assessed Left Upper Extremity Assessment LUE ROM/Strength/Tone: [facility] for tasks assessed Right Lower Extremity Assessment RLE ROM/Strength/Tone: [facility] for tasks assessed Left Lower Extremity Assessment LLE ROM/Strength/Tone: Deficits LLE ROM/Strength/Tone Deficits: left df 3+/5, otherwise grossly WFL   Balance    End of Session PT - End of Session Activity Tolerance: Patient tolerated treatment well  GP Functional Assessment Tool Used: clinical judgement Functional Limitation: Mobility: Walking and moving around Mobility: Walking and Moving Around Current Status ([id number]): At least 1 percent but less than 20 percent impaired, limited or restricted Mobility: Walking and Moving Around Goal Status [phone]): At least 1 percent but less than 20 percent impaired, limited or restricted Mobility: Walking and Moving Around Discharge Status [phone]): At least 1 percent but less than 20 percent impaired, limited or restricted   [facility] 03/03/2012, 12:34 PM

## 2012-03-04 LAB — GLUCOSE, CAPILLARY
Glucose-Capillary: 180 mg/dL — ABNORMAL HIGH (ref 70–99)
Glucose-Capillary: 133 mg/dL — ABNORMAL HIGH (ref 70–99)
Glucose-Capillary: 249 mg/dL — ABNORMAL HIGH (ref 70–99)

## 2012-03-04 LAB — CBC
Hemoglobin: 13.8 g/dL (ref 12.0–15.0)
MCHC: 35.5 g/dL (ref 30.0–36.0)
RBC: 4.63 MIL/uL (ref 3.87–5.11)

## 2012-03-04 LAB — BASIC METABOLIC PANEL
GFR calc non Af Amer: >90 mL/min (ref >90)
Glucose, Bld: 228 mg/dL — ABNORMAL HIGH (ref 70–99)
Potassium: 4 mEq/L (ref 3.5–5.1)
Sodium: 137 mEq/L (ref 135–145)

## 2012-03-04 NOTE — Progress Notes (Signed)
HPI [name] is an 51 y.o. female with a history of MS treated with Tysabri who presents with one month of leg weakness, hand numbness, blurred/double vision. She states that she came in one month ago with similar symptoms, but now has hand numbness and worsening leg weakness to the point where she is having trouble getting around at home.   She got a single shot of steroids during her previous ER visit, but feels like it wasn't enough to make her improve. She typically does improve of steroids during her previous exacerbations.   Of note, patient has a urinary tract infection and was recently started on treatment on Wednesday.   She was being managed in [place]  Subjective:  She is lying in bed. She had been sitting in chair earlier and had been up to the bathroom as well. She feels like her legs are stronger. She has underlying neuropathy that is unchanged. She has a history of diabetes that is controlled now with diet.    Objective: Current vital signs: BP 126/66  Pulse 76  Temp 97.9 F (36.6 C) (Oral)  Resp 20  Ht 5\' 3"  (1.6 m)  Wt 109.5 kg (241 lb 6.5 oz)  BMI 42.76 kg/m2  SpO2 99% Vital signs in last 24 hours: Temp:  [97.9 F (36.6 C)-98.2 F (36.8 C)] 97.9 F (36.6 C) (10/18 0645) Pulse Rate:  [72-78] 76  (10/18 0645) Resp:  [18-20] 20  (10/18 0645) BP: (100-139)/(56-66) 126/66 mmHg (10/18 0645) SpO2:  [95 %-100 %] 99 % (10/18 0645)  Nutritional status: Carb Control  Neurologic Exam: Mental Status: Alert, oriented, thought content appropriate.  Speech fluent without evidence of aphasia.  Able to follow 3 step commands without difficulty. Cranial Nerves: II: visual fields grossly normal, pupils equal, round III,IV, VI: ptosis not present, extra-ocular motions intact bilaterally V,VII: smile symmetric, facial light touch sensation normal bilaterally VIII: hearing normal bilaterally IX,X: gag reflex present XI: trapezius strength/neck flexion strength normal  bilaterally XII: tongue strength normal  Motor: Right : Upper extremity   5/5    Left:     Upper extremity   5/5  Lower extremity   5/5     Lower extremity   4/5 Tone and bulk:normal tone throughout; no atrophy noted Sensory: Pinprick and light touch intact throughout, bilaterally Deep Tendon Reflexes: 2+ and symmetric throughout Plantars: Right: downgoing   Left: downgoing Cerebellar: normal finger-to-nose, normal rapid alternating movements, slow gait    Lab Results: CBC: (elevated WBC-on steroids)  Lab 03/04/12 0323 03/01/12 1653  WBC 16.5* 7.2  NEUTROABS -- 2.8  HGB 13.8 13.4  HCT 38.9 38.7  MCV 84.0 83.9  PLT 235 228   Basic Metabolic Panel:  Lab 03/04/12 1610 03/01/12 1653  NA 137 140  K 4.0 3.5  CL 105 107  CO2 21 25  GLUCOSE 228* 81  BUN 14 10  CREATININE 0.73 0.72  CALCIUM 9.7 9.5  MG -- --  PHOS -- --   Liver Function Tests:  Lab 03/01/12 1653  AST 26  ALT 24  ALKPHOS 98  BILITOT 0.7  PROT 6.7  ALBUMIN 3.4*   Hemoglobin A1C:  Lab 03/01/12 1645  HGBA1C 5.3    Mr Brain W Contrast 03/02/2012   No abnormal intracranial enhancement.  No evidence for acute plaque activity in this patient with known MS.   Original Report Authenticated By: Elsie Stain, M.D.    Mr Cervical Spine W Contrast 03/02/2012  No evidence for abnormal  cervical cord enhancement to suggest acute MS plaque.   Original Report Authenticated By: Elsie Stain, M.D.    Mr Thoracic Spine W Wo Contrast 03/02/2012  1.  Stable T6 cord lesion, likely MS plaque with mild cord atrophy. No definite new thoracic cord lesions or abnormal areas of enhancement. 2.  No significant thoracic disc protrusions, spinal or foraminal stenosis.   Original Report Authenticated By: P. Loralie Champagne, M.D.     Medications:  Prior to Admission:  Prescriptions prior to admission  Medication Sig Dispense Refill  . ALPRAZolam (XANAX) 1 MG tablet Take 1 tablet (1 mg total) by mouth 2 (two) times daily as  needed. For anxiety.  30 tablet  2  . atorvastatin (LIPITOR) 80 MG tablet Take 1 tablet (80 mg total) by mouth daily.  90 tablet  3  . cloNIDine (CATAPRES) 0.2 MG tablet Take 1 tablet (0.2 mg total) by mouth 2 (two) times daily.  180 tablet  3  . diphenoxylate-atropine (LOMOTIL) 2.5-0.025 MG per tablet Take 1 tablet by mouth 2 (two) times daily. For diarrhea.  60 tablet  3  . DULoxetine (CYMBALTA) 30 MG capsule Take 30 mg by mouth daily.      . fluticasone (FLONASE) 50 MCG/ACT nasal spray Place 2 sprays into the nose daily.  16 g  11  . gabapentin (NEURONTIN) 800 MG tablet Take 1 tablet (800 mg total) by mouth 3 (three) times daily.  270 tablet  3  . hydrALAZINE (APRESOLINE) 100 MG tablet Take 100 mg by mouth 3 (three) times daily.      Marland Kitchen HYDROmorphone (DILAUDID) 8 MG tablet Take 8 mg by mouth every 4 (four) hours as needed. For pain.      Marland Kitchen LORazepam (ATIVAN) 2 MG tablet Take 1 tablet (2 mg total) by mouth at bedtime as needed. For sleep.  30 tablet  2  . losartan (COZAAR) 100 MG tablet Take 1 tablet (100 mg total) by mouth daily.  90 tablet  3  . metoprolol (LOPRESSOR) 50 MG tablet Take 1 tablet (50 mg total) by mouth 2 (two) times daily.  180 tablet  3  . morphine (MS CONTIN) 30 MG 12 hr tablet Take 1 tablet (30 mg total) by mouth 2 (two) times daily.  60 tablet  0  . Natalizumab (TYSABRI IV) Inject 300 mg into the vein every 30 (thirty) days.      Marland Kitchen NIFEdipine (PROCARDIA-XL/ADALAT-CC/NIFEDICAL-XL) 30 MG 24 hr tablet Take 30 mg by mouth daily.      . nitrofurantoin, macrocrystal-monohydrate, (MACROBID) 100 MG capsule Take 1 capsule (100 mg total) by mouth 2 (two) times daily.  14 capsule  0  . omeprazole (PRILOSEC) 20 MG capsule Take 1 capsule (20 mg total) by mouth daily.  90 capsule  3  . sucralfate (CARAFATE) 1 G tablet Take 1 tablet (1 g total) by mouth 2 (two) times daily.  90 tablet  3  . Tamsulosin HCl (FLOMAX) 0.4 MG CAPS Take 1 capsule (0.4 mg total) by mouth daily.  90 capsule  3  .  tiZANidine (ZANAFLEX) 4 MG tablet Take 1 tablet (4 mg total) by mouth every 8 (eight) hours as needed. For muscle spasm.  30 tablet  0  . topiramate (TOPAMAX) 100 MG tablet Take 1 tablet (100 mg total) by mouth daily.  90 tablet  3  . venlafaxine XR (EFFEXOR-XR) 37.5 MG 24 hr capsule Take 1 capsule (37.5 mg total) by mouth daily.  90 capsule  3  Scheduled:   . atorvastatin  80 mg Oral QHS  . Chlorhexidine Gluconate Cloth  6 each Topical Q0600  . cloNIDine  0.2 mg Oral BID  . diphenoxylate-atropine  1 tablet Oral BID  . DULoxetine  30 mg Oral QHS  . fluconazole  100 mg Oral Daily  . fluticasone  2 spray Each Nare QHS  . gabapentin  800 mg Oral TID  . heparin  5,000 Units Subcutaneous Q8H  . hydrALAZINE  100 mg Oral TID  . insulin aspart  0-20 Units Subcutaneous TID WC  . insulin aspart  0-5 Units Subcutaneous QHS  . losartan  100 mg Oral QHS  . methylPREDNISolone (SOLU-MEDROL) injection  500 mg Intravenous Daily  . metoprolol  50 mg Oral BID  . morphine  30 mg Oral BID  . mupirocin ointment  1 application Nasal BID  . NIFEdipine  30 mg Oral QHS  . nitrofurantoin (macrocrystal-monohydrate)  100 mg Oral BID  . pantoprazole  40 mg Oral QHS  . sodium chloride  3 mL Intravenous Q12H  . sucralfate  1 g Oral BID  . Tamsulosin HCl  0.4 mg Oral QHS  . topiramate  100 mg Oral QHS  . venlafaxine XR  37.5 mg Oral QHS  . DISCONTD: insulin aspart  0-15 Units Subcutaneous TID WC  . DISCONTD: nitrofurantoin (macrocrystal-monohydrate)  100 mg Oral BID   Assessment/Plan:   Multiple Sclerosis, She has had 4 out of 10 doses of 500mg  IV steroids. She feels stronger.  Continue current therapy for a total of 1000mg  of steroids over a 5 day period treatment, then if medically stable followup with Dr. Anne Hahn No further neurologic intervention is recommended at this time.  If further questions arise, please call or page at that time.  Thank you for allowing neurology to participate in the care of this  patient       Job Founds, MBA, [facility] Pager [phone]   LOS: 3 days

## 2012-03-04 NOTE — Progress Notes (Signed)
Subjective: Feeling chronic neuropathic pain. No other specific complaints.  Objective: Vital signs in last 24 hours: Filed Vitals:   03/03/12 2200 03/03/12 2207 03/03/12 2251 03/04/12 0645  BP: 117/56 117/56 139/66 126/66  Pulse: 76 72  76  Temp: 98 F (36.7 C) 98 F (36.7 C)  97.9 F (36.6 C)  TempSrc: Oral Oral  Oral  Resp: 18 18  20   Height:      Weight:      SpO2: 100% 100%  99%   Weight change:   Intake/Output Summary (Last 24 hours) at 03/04/12 1021 Last data filed at 03/04/12 0942  Gross per 24 hour  Intake   1210 ml  Output   2551 ml  Net  -1341 ml    Physical Exam: General: Awake, Oriented, No acute distress. HEENT: EOMI. Neck: Supple CV: S1 and S2 Lungs: Clear to ascultation bilaterally Abdomen: Soft, Nontender, Nondistended, +bowel sounds. Ext: Good pulses. Trace edema.  Lab Results: Basic Metabolic Panel:  Lab 03/04/12 9604 03/01/12 1653  NA 137 140  K 4.0 3.5  CL 105 107  CO2 21 25  GLUCOSE 228* 81  BUN 14 10  CREATININE 0.73 0.72  CALCIUM 9.7 9.5  MG -- --  PHOS -- --   Liver Function Tests:  Lab 03/01/12 1653  AST 26  ALT 24  ALKPHOS 98  BILITOT 0.7  PROT 6.7  ALBUMIN 3.4*   No results found for this basename: LIPASE:5,AMYLASE:5 in the last 168 hours No results found for this basename: AMMONIA:5 in the last 168 hours CBC:  Lab 03/04/12 0323 03/01/12 1653  WBC 16.5* 7.2  NEUTROABS -- 2.8  HGB 13.8 13.4  HCT 38.9 38.7  MCV 84.0 83.9  PLT 235 228   Cardiac Enzymes:  Lab 03/01/12 1653  CKTOTAL 70  CKMB --  CKMBINDEX --  TROPONINI --   BNP (last 3 results) No results found for this basename: PROBNP:3 in the last 8760 hours CBG:  Lab 03/03/12 2203 03/03/12 1652 03/03/12 1214 03/03/12 0740 03/02/12 2204  GLUCAP 180* 160* 135* 246* 191*    Basename 03/01/12 1645  HGBA1C 5.3   Other Labs: No components found with this basename: POCBNP:3 No results found for this basename: DDIMER:2 in the last 168 hours No results  found for this basename: CHOL:2,HDL:2,LDLCALC:2,TRIG:2,CHOLHDL:2,LDLDIRECT:2 in the last 168 hours No results found for this basename: TSH,T4TOTAL,FREET3,T3FREE,FREET4,THYROIDAB in the last 168 hours No results found for this basename: VITAMINB12:2,FOLATE:2,FERRITIN:2,TIBC:2,IRON:2,RETICCTPCT:2 in the last 168 hours  Micro Results: Recent Results (from the past 240 hour(s))  URINE CULTURE     Status: Normal   Collection Time   02/24/12  8:00 AM      Component Value Range Status Comment   Colony Count 3,000 COLONIES/ML   Final    Organism ID, Bacteria Insignificant Growth   Final   MRSA PCR SCREENING     Status: Abnormal   Collection Time   03/02/12 12:03 AM      Component Value Range Status Comment   MRSA by PCR POSITIVE (*) NEGATIVE Final     Studies/Results: Mr Laqueta Jean Contrast  03/02/2012  *RADIOLOGY REPORT*  Clinical Data: Known multiple sclerosis.  Evaluate for intracranial enhancing lesion.  MRI HEAD WITH CONTRAST  Technique:  Multiplanar, multiecho pulse sequences of the brain and surrounding structures were obtained according to standard protocol with intravenous contrast  Contrast:  MultiHance 20 ml.  Comparison: Noncontrast scan performed 03/01/2012.  Findings: No abnormal intracranial enhancement of the brain or meninges.  No other white matter lesions show blood brain barrier breakdown. Normal appearing extracranial soft tissues. No optic nerve lesion.  IMPRESSION: No abnormal intracranial enhancement.  No evidence for acute plaque activity in this patient with known MS.   Original Report Authenticated By: Elsie Stain, M.D.    Mr Cervical Spine W Contrast  03/02/2012  *RADIOLOGY REPORT*  Clinical Data: MS flare-up.  MRI CERVICAL SPINE WITH CONTRAST  Technique:  Multiplanar and multiecho pulse sequences of the cervical spine, to include the craniocervical junction and cervicothoracic junction, were obtained according to standard protocol with intravenous contrast.  Contrast:   MultiHance 20 ml.  Comparison: Noncontrast MRI performed yesterday.  Findings: The patient returned for contrast after sedation.  There is no abnormal enhancement of the spine, intraspinal contents, or cord. Special attention is directed to the previously identified hyperintense lesions at C3-4 and C2, and do not display significant enhancement.  IMPRESSION: No evidence for abnormal cervical cord enhancement to suggest acute MS plaque.   Original Report Authenticated By: Elsie Stain, M.D.    Mr Thoracic Spine W Wo Contrast  03/02/2012  *RADIOLOGY REPORT*  Clinical Data: Difficulty walking.  Known multiple sclerosis.  MRI THORACIC SPINE WITHOUT AND WITH CONTRAST  Technique:  Multiplanar and multiecho pulse sequences of the thoracic spine were obtained without and with intravenous contrast.  Contrast: 20mL MULTIHANCE GADOBENATE DIMEGLUMINE 529 MG/ML IV SOLN  Comparison: Thoracic spine MRI from 2019.  Findings: The vertebral bodies demonstrate normal alignment and normal marrow signal except for a few small scattered hemangiomas. There is a stable area of increased signal intensity in the thoracic spinal cord at the T6 level with mild cord atrophy.  No enhancement is demonstrated.  No new lesions are seen.  No enhancing lesions.  The facets are normally aligned.  No paraspinal process.  IMPRESSION:  1.  Stable T6 cord lesion, likely MS plaque with mild cord atrophy. No definite new thoracic cord lesions or abnormal areas of enhancement. 2.  No significant thoracic disc protrusions, spinal or foraminal stenosis.   Original Report Authenticated By: P. Loralie Champagne, M.D.     Medications: I have reviewed the patient's current medications. Scheduled Meds:    . atorvastatin  80 mg Oral QHS  . Chlorhexidine Gluconate Cloth  6 each Topical Q0600  . cloNIDine  0.2 mg Oral BID  . diphenoxylate-atropine  1 tablet Oral BID  . DULoxetine  30 mg Oral QHS  . fluconazole  100 mg Oral Daily  . fluticasone  2 spray  Each Nare QHS  . gabapentin  800 mg Oral TID  . heparin  5,000 Units Subcutaneous Q8H  . hydrALAZINE  100 mg Oral TID  . insulin aspart  0-20 Units Subcutaneous TID WC  . insulin aspart  0-5 Units Subcutaneous QHS  . losartan  100 mg Oral QHS  . methylPREDNISolone (SOLU-MEDROL) injection  500 mg Intravenous Daily  . metoprolol  50 mg Oral BID  . morphine  30 mg Oral BID  . mupirocin ointment  1 application Nasal BID  . NIFEdipine  30 mg Oral QHS  . nitrofurantoin (macrocrystal-monohydrate)  100 mg Oral BID  . pantoprazole  40 mg Oral QHS  . sodium chloride  3 mL Intravenous Q12H  . sucralfate  1 g Oral BID  . Tamsulosin HCl  0.4 mg Oral QHS  . topiramate  100 mg Oral QHS  . venlafaxine XR  37.5 mg Oral QHS  . DISCONTD: nitrofurantoin (macrocrystal-monohydrate)  100 mg Oral  BID   Continuous Infusions:  PRN Meds:.sodium chloride, ALPRAZolam, HYDROmorphone, sodium chloride, tiZANidine  Assessment/Plan: Possible MS flare vs chronic pain flare up due to being out of pain medications versus recent UTI Appreciate neurology input. MRI of brain and spine without contrast showed stable cord lesions consistent with chronic MS. MRI of brain and spine with contrast on 03/02/2012 showed no acute evidence for MS. Discuss with neurology, acute MS may not showup on MRI, neurology recommended treating with 5 day course of IV steroids (till 03/06/2012). PT evaluation noted no PT needs. Will need outpatient evaluation with neurology for monthly tysabri infusion, patient reports that is in the process of arranging followup with Dr. Anne Hahn.  Recent UTI UA positive prior to admission, continue nitrofurantoin, defined 7 days total as outpatient till 03/06/2012. Urine analysis on admission not suggestive of urinary tract infection. Fluconazole for prophylaxis to prevent superimposed yeast infection.  H/o DM2 off of meds at baseline  SSI resistant scale. Hemoglobin A1C 5.3 prior to admission, not diabetic.  Suspect would not need any diabetic medications after discharge.  HTN Hypotension resolved. Stable. Continue home meds with hold parameters.  Morbid Obesity with possible obstructive sleep apnea Will arrange for outpatient sleep study.  Chronic pain Continue home medications.  Prophylaxis SQ Heparin  Disposition Plan for discharge on 03/06/2012 after steroids are completed.   LOS: 3 days  Manolito Jurewicz A, MD 03/04/2012, 10:21 AM

## 2012-03-05 LAB — GLUCOSE, CAPILLARY
Glucose-Capillary: 144 mg/dL — ABNORMAL HIGH (ref 70–99)
Glucose-Capillary: 256 mg/dL — ABNORMAL HIGH (ref 70–99)

## 2012-03-05 NOTE — Progress Notes (Signed)
Subjective: No specific complaints.  Objective: Vital signs in last 24 hours: Filed Vitals:   03/04/12 0645 03/04/12 1426 03/04/12 2144 03/05/12 0521  BP: 126/66 122/68 132/64 135/66  Pulse: 76 72 77 70  Temp: 97.9 F (36.6 C) 98.2 F (36.8 C) 97.9 F (36.6 C) 98.2 F (36.8 C)  TempSrc: Oral Oral Oral Oral  Resp: 20 18 18 18   Height:      Weight:      SpO2: 99% 98% 98% 98%   Weight change:   Intake/Output Summary (Last 24 hours) at 03/05/12 1131 Last data filed at 03/05/12 0900  Gross per 24 hour  Intake    840 ml  Output   1750 ml  Net   -910 ml    Physical Exam: General: Awake, Oriented, No acute distress. HEENT: EOMI. Neck: Supple CV: S1 and S2 Lungs: Clear to ascultation bilaterally Abdomen: Soft, Nontender, Nondistended, +bowel sounds. Ext: Good pulses. Trace edema.  Lab Results: Basic Metabolic Panel:  Lab 03/04/12 0454 03/01/12 1653  NA 137 140  K 4.0 3.5  CL 105 107  CO2 21 25  GLUCOSE 228* 81  BUN 14 10  CREATININE 0.73 0.72  CALCIUM 9.7 9.5  MG -- --  PHOS -- --   Liver Function Tests:  Lab 03/01/12 1653  AST 26  ALT 24  ALKPHOS 98  BILITOT 0.7  PROT 6.7  ALBUMIN 3.4*   No results found for this basename: LIPASE:5,AMYLASE:5 in the last 168 hours No results found for this basename: AMMONIA:5 in the last 168 hours CBC:  Lab 03/04/12 0323 03/01/12 1653  WBC 16.5* 7.2  NEUTROABS -- 2.8  HGB 13.8 13.4  HCT 38.9 38.7  MCV 84.0 83.9  PLT 235 228   Cardiac Enzymes:  Lab 03/01/12 1653  CKTOTAL 70  CKMB --  CKMBINDEX --  TROPONINI --   BNP (last 3 results) No results found for this basename: PROBNP:3 in the last 8760 hours CBG:  Lab 03/05/12 0734 03/04/12 2140 03/04/12 1750 03/04/12 1208 03/04/12 0730  GLUCAP 144* 249* 166* 125* 133*   No results found for this basename: HGBA1C:5 in the last 72 hours Other Labs: No components found with this basename: POCBNP:3 No results found for this basename: DDIMER:2 in the last 168  hours No results found for this basename: CHOL:2,HDL:2,LDLCALC:2,TRIG:2,CHOLHDL:2,LDLDIRECT:2 in the last 168 hours No results found for this basename: TSH,T4TOTAL,FREET3,T3FREE,FREET4,THYROIDAB in the last 168 hours No results found for this basename: VITAMINB12:2,FOLATE:2,FERRITIN:2,TIBC:2,IRON:2,RETICCTPCT:2 in the last 168 hours  Micro Results: Recent Results (from the past 240 hour(s))  MRSA PCR SCREENING     Status: Abnormal   Collection Time   03/02/12 12:03 AM      Component Value Range Status Comment   MRSA by PCR POSITIVE (*) NEGATIVE Final     Studies/Results: No results found.  Medications: I have reviewed the patient's current medications. Scheduled Meds:    . atorvastatin  80 mg Oral QHS  . Chlorhexidine Gluconate Cloth  6 each Topical Q0600  . cloNIDine  0.2 mg Oral BID  . diphenoxylate-atropine  1 tablet Oral BID  . DULoxetine  30 mg Oral QHS  . fluticasone  2 spray Each Nare QHS  . gabapentin  800 mg Oral TID  . heparin  5,000 Units Subcutaneous Q8H  . hydrALAZINE  100 mg Oral TID  . insulin aspart  0-20 Units Subcutaneous TID WC  . insulin aspart  0-5 Units Subcutaneous QHS  . losartan  100 mg Oral QHS  .  methylPREDNISolone (SOLU-MEDROL) injection  500 mg Intravenous Daily  . metoprolol  50 mg Oral BID  . morphine  30 mg Oral BID  . mupirocin ointment  1 application Nasal BID  . NIFEdipine  30 mg Oral QHS  . nitrofurantoin (macrocrystal-monohydrate)  100 mg Oral BID  . pantoprazole  40 mg Oral QHS  . sodium chloride  3 mL Intravenous Q12H  . sucralfate  1 g Oral BID  . Tamsulosin HCl  0.4 mg Oral QHS  . topiramate  100 mg Oral QHS  . venlafaxine XR  37.5 mg Oral QHS   Continuous Infusions:  PRN Meds:.sodium chloride, ALPRAZolam, HYDROmorphone, sodium chloride, tiZANidine  Assessment/Plan: Possible MS flare vs chronic pain flare up due to being out of pain medications versus recent UTI Appreciate neurology input. MRI of brain and spine without  contrast showed stable cord lesions consistent with chronic MS. MRI of brain and spine with contrast on 03/02/2012 showed no acute evidence for MS. Discuss with neurology, acute MS may not showup on MRI, neurology recommended treating with 5 day course of IV steroids (till 03/06/2012). PT evaluation noted no PT needs. Will need outpatient evaluation with neurology for monthly tysabri infusion, patient reports that she is in the process of arranging followup with Dr. Anne Hahn.  Recent UTI UA positive prior to admission, continue nitrofurantoin, defined 7 days total as outpatient till 03/06/2012. Urine analysis on admission not suggestive of urinary tract infection. Fluconazole for prophylaxis to prevent superimposed yeast infection.  H/o DM2 off of meds at baseline  SSI resistant scale. Hemoglobin A1C 5.3 prior to admission, not diabetic. Suspect would not need any diabetic medications after discharge.  HTN Hypotension resolved. Stable. Continue home meds with hold parameters.  Morbid Obesity with possible obstructive sleep apnea Will arrange for outpatient sleep study.  Chronic pain Continue home medications.  Prophylaxis SQ Heparin  Disposition Plan for discharge on 03/06/2012 after steroids are completed.   LOS: 4 days  Cynthia Anton A, MD 03/05/2012, 11:31 AM

## 2012-03-06 LAB — GLUCOSE, CAPILLARY: Glucose-Capillary: 123 mg/dL — ABNORMAL HIGH (ref 70–99)

## 2012-03-06 MED ORDER — HYDROMORPHONE HCL 8 MG PO TABS: 8.0000 mg | ORAL_TABLET | ORAL | Status: DC | PRN

## 2012-03-06 NOTE — Discharge Summary (Signed)
Physician Discharge Summary  CATHREN SWEEN ZOX:[id number] DOB: [birth year] DOA: 03/01/2012  PCP: Dora Sims, Student-RN  Admit date: 03/01/2012 Discharge date: 03/06/2012  Recommendations for Outpatient Follow-up:  Followup with PCP in 1 week. Followup with Dr. Anne Hahn (neurology) in 1 week. Followup with pain clinic as already scheduled this week.  Discharge Diagnoses:  Active Problems:  DIABETES MELLITUS  Other chronic pain  Multiple sclerosis  HYPERTENSION  Discharge Condition: Stable  Diet recommendation: Heart healthy diet  Filed Weights   03/01/12 2332  Weight: 109.5 kg (241 lb 6.5 oz)    History of present illness:  [name] is a 51 y.o. female who presents with complaint of MS flare up on 03/01/2012.  Hospital Course:  Possible MS flare vs chronic pain flare up due to being out of pain medications versus recent UTI Neurology evaluated the patient. MRI of brain and spine without contrast showed stable cord lesions consistent with chronic MS. MRI of brain and spine with contrast on 03/02/2012 showed no acute evidence for MS. Discuss with neurology, acute MS may not showup on MRI, neurology recommended treating with 5 day course of IV steroids (till 03/06/2012). PT evaluation noted no PT needs. Will need outpatient evaluation with neurology for monthly tysabri infusion, patient reports that she is in the process of arranging followup with Dr. Anne Hahn.  Recent UTI UA positive prior to admission, continue nitrofurantoin, defined 7 days total as outpatient till 03/06/2012. Urine analysis on admission not suggestive of urinary tract infection. Fluconazole for prophylaxis to prevent superimposed yeast infection.  H/o DM2 off of meds at baseline  SSI resistant scale. Hemoglobin A1C 5.3 prior to admission, not diabetic. Suspect would not need any diabetic medications after discharge.  HTN Hypotension resolved. Stable. Continue home meds with hold parameters.  Morbid  Obesity with possible obstructive sleep apnea Will arrange for outpatient sleep study.  Chronic pain Continue home medications. Gave the patient 5 day prescription of dilaudid until establishing care at pain clinic.  Procedures: As above.  Consultations:  Neurology.  Discharge Exam: Filed Vitals:   03/05/12 0521 03/05/12 1407 03/05/12 2111 03/06/12 0515  BP: 135/66 127/68 140/67 162/77  Pulse: 70 66 69 60  Temp: 98.2 F (36.8 C) 98.6 F (37 C) 98 F (36.7 C) 97.9 F (36.6 C)  TempSrc: Oral Oral Oral Oral  Resp: 18 18 19 16   Height:      Weight:      SpO2: 98% 96% 97% 94%   Discharge Instructions  Discharge Orders    Future Appointments: Provider: Department: Dept Phone: Center:   03/07/2012 11:20 AM Ranelle Oyster, MD [facility] [phone] [facility]     Future Orders Please Complete By Expires   Diet - low sodium heart healthy      Increase activity slowly      Discharge instructions      Comments:   Followup with PCP in 1 week. Followup with Dr. Anne Hahn (neurology) in 1 week. Followup with pain clinic as already scheduled this week.       Medication List     As of 03/06/2012 12:42 PM    STOP taking these medications         nitrofurantoin (macrocrystal-monohydrate) 100 MG capsule   Commonly known as: MACROBID      TAKE these medications         ALPRAZolam 1 MG tablet   Commonly known as: XANAX   Take 1 tablet (1 mg total) by mouth  2 (two) times daily as needed. For anxiety.      atorvastatin 80 MG tablet   Commonly known as: LIPITOR   Take 1 tablet (80 mg total) by mouth daily.      cloNIDine 0.2 MG tablet   Commonly known as: CATAPRES   Take 1 tablet (0.2 mg total) by mouth 2 (two) times daily.      diphenoxylate-atropine 2.5-0.025 MG per tablet   Commonly known as: LOMOTIL   Take 1 tablet by mouth 2 (two) times daily. For diarrhea.      DULoxetine 30 MG capsule   Commonly known as: CYMBALTA   Take 30 mg by mouth daily.       fluticasone 50 MCG/ACT nasal spray   Commonly known as: FLONASE   Place 2 sprays into the nose daily.      gabapentin 800 MG tablet   Commonly known as: NEURONTIN   Take 1 tablet (800 mg total) by mouth 3 (three) times daily.      hydrALAZINE 100 MG tablet   Commonly known as: APRESOLINE   Take 100 mg by mouth 3 (three) times daily.      HYDROmorphone 8 MG tablet   Commonly known as: DILAUDID   Take 1 tablet (8 mg total) by mouth every 4 (four) hours as needed for pain. For pain.      LORazepam 2 MG tablet   Commonly known as: ATIVAN   Take 1 tablet (2 mg total) by mouth at bedtime as needed. For sleep.      losartan 100 MG tablet   Commonly known as: COZAAR   Take 1 tablet (100 mg total) by mouth daily.      metoprolol 50 MG tablet   Commonly known as: LOPRESSOR   Take 1 tablet (50 mg total) by mouth 2 (two) times daily.      morphine 30 MG 12 hr tablet   Commonly known as: MS CONTIN   Take 1 tablet (30 mg total) by mouth 2 (two) times daily.      NIFEdipine 30 MG 24 hr tablet   Commonly known as: PROCARDIA-XL/ADALAT-CC/NIFEDICAL-XL   Take 30 mg by mouth daily.      omeprazole 20 MG capsule   Commonly known as: PRILOSEC   Take 1 capsule (20 mg total) by mouth daily.      sucralfate 1 G tablet   Commonly known as: CARAFATE   Take 1 tablet (1 g total) by mouth 2 (two) times daily.      Tamsulosin HCl 0.4 MG Caps   Commonly known as: FLOMAX   Take 1 capsule (0.4 mg total) by mouth daily.      tiZANidine 4 MG tablet   Commonly known as: ZANAFLEX   Take 1 tablet (4 mg total) by mouth every 8 (eight) hours as needed. For muscle spasm.      topiramate 100 MG tablet   Commonly known as: TOPAMAX   Take 1 tablet (100 mg total) by mouth daily.      TYSABRI IV   Inject 300 mg into the vein every 30 (thirty) days.      venlafaxine XR 37.5 MG 24 hr capsule   Commonly known as: EFFEXOR-XR   Take 1 capsule (37.5 mg total) by mouth daily.           Follow-up  Information    Follow up with Dora Sims, Student-RN. Schedule an appointment as soon as possible for a visit in 1 week.  Follow up with Lesly Dukes, MD. Schedule an appointment as soon as possible for a visit in 1 week.   Contact information:   [address], [id number] [facility] [id number] [phone]           The results of significant diagnostics from this hospitalization (including imaging, microbiology, ancillary and laboratory) are listed below for reference.    Significant Diagnostic Studies: Ct Head Wo Contrast  02/25/2012  *RADIOLOGY REPORT*  Clinical Data: Multiple sclerosis.  Numbness, weakness.  CT HEAD WITHOUT CONTRAST  Technique:  Contiguous axial images were obtained from the base of the skull through the vertex without contrast.  Comparison: 12/24/2009  Findings: No acute intracranial abnormality.  Specifically, no hemorrhage, hydrocephalus, mass lesion, acute infarction, or significant intracranial injury.  No acute calvarial abnormality. Extensive artifact from the calvarium.  Visualized paranasal sinuses and mastoids clear.  Orbital soft tissues unremarkable.  IMPRESSION: No acute intracranial abnormality.   Original Report Authenticated By: Cyndie Chime, M.D.    Mr Brain Wo Contrast  03/02/2012  **ADDENDUM** CREATED: 03/02/2012 09:11:43  Correction in the body of the report and Impression: no contrast was administered to evaluate for post contrast enhancement.  **END ADDENDUM** SIGNED BY: Elsie Stain, M.D.   03/02/2012  *RADIOLOGY REPORT*  Clinical Data: Possible flare-up MS.  MRI HEAD WITHOUT CONTRAST  Technique:  Multiplanar, multiecho pulse sequences of the brain and surrounding structures were obtained according to standard protocol without intravenous contrast.  Comparison: MRI brain 02/19/2009 and 12/24/2009.  Findings: No acute stroke, acute hemorrhage, or new white matter lesions compared with 2010 and  2011.  Nonspecific prominence of the callosal septal interface.  No restricted diffusion or abnormal post contrast enhancement.  Periventricular white matter signal abnormality similar to priors, consistent with the diagnosis of multiple sclerosis, given the cervical findings described separately. No foci of chronic hemorrhage, mass lesion, or hydrocephalus.  Slight premature atrophy for age 31.  Normal orbits, sinuses, and mastoids.  Negative pituitary and cerebellar tonsils.  IMPRESSION: Stable predominately periventricular white matter lesions consistent with chronic MS.  No restricted diffusion or abnormal post contrast enhancement to suggest acute plaque activity.   Original Report Authenticated By: Elsie Stain, M.D.    Mr Laqueta Jean Contrast  03/02/2012  *RADIOLOGY REPORT*  Clinical Data: Known multiple sclerosis.  Evaluate for intracranial enhancing lesion.  MRI HEAD WITH CONTRAST  Technique:  Multiplanar, multiecho pulse sequences of the brain and surrounding structures were obtained according to standard protocol with intravenous contrast  Contrast:  MultiHance 20 ml.  Comparison: Noncontrast scan performed 03/01/2012.  Findings: No abnormal intracranial enhancement of the brain or meninges.  No other white matter lesions show blood brain barrier breakdown. Normal appearing extracranial soft tissues. No optic nerve lesion.  IMPRESSION: No abnormal intracranial enhancement.  No evidence for acute plaque activity in this patient with known MS.   Original Report Authenticated By: Elsie Stain, M.D.    Mr Cervical Spine Wo Contrast  03/02/2012  *RADIOLOGY REPORT*  Clinical Data: Chronic MS, evaluate for acute activity.  MRI CERVICAL SPINE WITHOUT CONTRAST  Technique:  Multiplanar and multiecho pulse sequences of the cervical spine, to include the craniocervical junction and cervicothoracic junction, were obtained according to standard protocol without intravenous contrast.  Comparison: 03/02/2009.   Findings: Prominence of ventral cord signal opposite C3-C4 remains, with slight cord atrophy. Midline abnormal dorsal cord signal opposite the C2 vertebral body redemonstrated and also stable.  No  new cervical spine lesions.  Mild reversal normal cervical lordotic curve. Mild osteophytic ridging C4-C5 without significant neural impingement.  Disc space narrowing C5-6 and C6-7. Shallow central protrusion at C5-6 central to the right and C6-7 central to the right are similar to priors. Left-sided neural foraminal narrowing C5-6 persists due to uncinate spurring.  IMPRESSION: Stable cord lesions consistent with chronic MS.  Stable chronic spondylosis C4-C7.   Original Report Authenticated By: Elsie Stain, M.D.    Mr Cervical Spine W Contrast  03/02/2012  *RADIOLOGY REPORT*  Clinical Data: MS flare-up.  MRI CERVICAL SPINE WITH CONTRAST  Technique:  Multiplanar and multiecho pulse sequences of the cervical spine, to include the craniocervical junction and cervicothoracic junction, were obtained according to standard protocol with intravenous contrast.  Contrast:  MultiHance 20 ml.  Comparison: Noncontrast MRI performed yesterday.  Findings: The patient returned for contrast after sedation.  There is no abnormal enhancement of the spine, intraspinal contents, or cord. Special attention is directed to the previously identified hyperintense lesions at C3-4 and C2, and do not display significant enhancement.  IMPRESSION: No evidence for abnormal cervical cord enhancement to suggest acute MS plaque.   Original Report Authenticated By: Elsie Stain, M.D.    Mr Thoracic Spine W Wo Contrast  03/02/2012  *RADIOLOGY REPORT*  Clinical Data: Difficulty walking.  Known multiple sclerosis.  MRI THORACIC SPINE WITHOUT AND WITH CONTRAST  Technique:  Multiplanar and multiecho pulse sequences of the thoracic spine were obtained without and with intravenous contrast.  Contrast: 20mL MULTIHANCE GADOBENATE DIMEGLUMINE 529 MG/ML  IV SOLN  Comparison: Thoracic spine MRI from 2019.  Findings: The vertebral bodies demonstrate normal alignment and normal marrow signal except for a few small scattered hemangiomas. There is a stable area of increased signal intensity in the thoracic spinal cord at the T6 level with mild cord atrophy.  No enhancement is demonstrated.  No new lesions are seen.  No enhancing lesions.  The facets are normally aligned.  No paraspinal process.  IMPRESSION:  1.  Stable T6 cord lesion, likely MS plaque with mild cord atrophy. No definite new thoracic cord lesions or abnormal areas of enhancement. 2.  No significant thoracic disc protrusions, spinal or foraminal stenosis.   Original Report Authenticated By: P. Loralie Champagne, M.D.     Microbiology: Recent Results (from the past 240 hour(s))  MRSA PCR SCREENING     Status: Abnormal   Collection Time   03/02/12 12:03 AM      Component Value Range Status Comment   MRSA by PCR POSITIVE (*) NEGATIVE Final      Labs: Basic Metabolic Panel:  Lab 03/04/12 1610 03/01/12 1653  NA 137 140  K 4.0 3.5  CL 105 107  CO2 21 25  GLUCOSE 228* 81  BUN 14 10  CREATININE 0.73 0.72  CALCIUM 9.7 9.5  MG -- --  PHOS -- --   Liver Function Tests:  Lab 03/01/12 1653  AST 26  ALT 24  ALKPHOS 98  BILITOT 0.7  PROT 6.7  ALBUMIN 3.4*   No results found for this basename: LIPASE:5,AMYLASE:5 in the last 168 hours No results found for this basename: AMMONIA:5 in the last 168 hours CBC:  Lab 03/04/12 0323 03/01/12 1653  WBC 16.5* 7.2  NEUTROABS -- 2.8  HGB 13.8 13.4  HCT 38.9 38.7  MCV 84.0 83.9  PLT 235 228   Cardiac Enzymes:  Lab 03/01/12 1653  CKTOTAL 70  CKMB --  CKMBINDEX --  TROPONINI --  BNP: BNP (last 3 results) No results found for this basename: PROBNP:3 in the last 8760 hours CBG:  Lab 03/06/12 1205 03/06/12 0742 03/05/12 2110 03/05/12 1723 03/05/12 1141  GLUCAP 115* 123* 256* 259* 164*    Time coordinating discharge: 25  minutes  Signed:  Konstantine Gervasi A  [facility] 03/06/2012, 12:42 PM

## 2012-03-06 NOTE — Progress Notes (Signed)
Pt discharged to home. DC instructions given with no concerns voiced. Prescription x 1 given for pain med. Left unit in wheelchair pushed by nurse tech. Left in good condition.

## 2012-03-06 NOTE — Progress Notes (Signed)
Subjective: No specific concerns. Eager to go home.  Objective: Vital signs in last 24 hours: Filed Vitals:   03/05/12 0521 03/05/12 1407 03/05/12 2111 03/06/12 0515  BP: 135/66 127/68 140/67 162/77  Pulse: 70 66 69 60  Temp: 98.2 F (36.8 C) 98.6 F (37 C) 98 F (36.7 C) 97.9 F (36.6 C)  TempSrc: Oral Oral Oral Oral  Resp: 18 18 19 16   Height:      Weight:      SpO2: 98% 96% 97% 94%   Weight change:   Intake/Output Summary (Last 24 hours) at 03/06/12 1238 Last data filed at 03/06/12 1211  Gross per 24 hour  Intake   1220 ml  Output   2200 ml  Net   -980 ml    Physical Exam: General: Awake, Oriented, No acute distress. HEENT: EOMI. Neck: Supple CV: S1 and S2 Lungs: Clear to ascultation bilaterally Abdomen: Soft, Nontender, Nondistended, +bowel sounds. Ext: Good pulses. Trace edema.  Lab Results: Basic Metabolic Panel:  Lab 03/04/12 1610 03/01/12 1653  NA 137 140  K 4.0 3.5  CL 105 107  CO2 21 25  GLUCOSE 228* 81  BUN 14 10  CREATININE 0.73 0.72  CALCIUM 9.7 9.5  MG -- --  PHOS -- --   Liver Function Tests:  Lab 03/01/12 1653  AST 26  ALT 24  ALKPHOS 98  BILITOT 0.7  PROT 6.7  ALBUMIN 3.4*   No results found for this basename: LIPASE:5,AMYLASE:5 in the last 168 hours No results found for this basename: AMMONIA:5 in the last 168 hours CBC:  Lab 03/04/12 0323 03/01/12 1653  WBC 16.5* 7.2  NEUTROABS -- 2.8  HGB 13.8 13.4  HCT 38.9 38.7  MCV 84.0 83.9  PLT 235 228   Cardiac Enzymes:  Lab 03/01/12 1653  CKTOTAL 70  CKMB --  CKMBINDEX --  TROPONINI --   BNP (last 3 results) No results found for this basename: PROBNP:3 in the last 8760 hours CBG:  Lab 03/06/12 1205 03/06/12 0742 03/05/12 2110 03/05/12 1723 03/05/12 1141  GLUCAP 115* 123* 256* 259* 164*   No results found for this basename: HGBA1C:5 in the last 72 hours Other Labs: No components found with this basename: POCBNP:3 No results found for this basename: DDIMER:2 in the  last 168 hours No results found for this basename: CHOL:2,HDL:2,LDLCALC:2,TRIG:2,CHOLHDL:2,LDLDIRECT:2 in the last 168 hours No results found for this basename: TSH,T4TOTAL,FREET3,T3FREE,FREET4,THYROIDAB in the last 168 hours No results found for this basename: VITAMINB12:2,FOLATE:2,FERRITIN:2,TIBC:2,IRON:2,RETICCTPCT:2 in the last 168 hours  Micro Results: Recent Results (from the past 240 hour(s))  MRSA PCR SCREENING     Status: Abnormal   Collection Time   03/02/12 12:03 AM      Component Value Range Status Comment   MRSA by PCR POSITIVE (*) NEGATIVE Final     Studies/Results: No results found.  Medications: I have reviewed the patient's current medications. Scheduled Meds:    . atorvastatin  80 mg Oral QHS  . Chlorhexidine Gluconate Cloth  6 each Topical Q0600  . cloNIDine  0.2 mg Oral BID  . diphenoxylate-atropine  1 tablet Oral BID  . DULoxetine  30 mg Oral QHS  . fluticasone  2 spray Each Nare QHS  . gabapentin  800 mg Oral TID  . heparin  5,000 Units Subcutaneous Q8H  . hydrALAZINE  100 mg Oral TID  . insulin aspart  0-20 Units Subcutaneous TID WC  . insulin aspart  0-5 Units Subcutaneous QHS  . losartan  100  mg Oral QHS  . methylPREDNISolone (SOLU-MEDROL) injection  500 mg Intravenous Daily  . metoprolol  50 mg Oral BID  . morphine  30 mg Oral BID  . mupirocin ointment  1 application Nasal BID  . NIFEdipine  30 mg Oral QHS  . nitrofurantoin (macrocrystal-monohydrate)  100 mg Oral BID  . pantoprazole  40 mg Oral QHS  . sodium chloride  3 mL Intravenous Q12H  . sucralfate  1 g Oral BID  . Tamsulosin HCl  0.4 mg Oral QHS  . topiramate  100 mg Oral QHS  . venlafaxine XR  37.5 mg Oral QHS   Continuous Infusions:  PRN Meds:.sodium chloride, ALPRAZolam, HYDROmorphone, sodium chloride, tiZANidine  Assessment/Plan: Possible MS flare vs chronic pain flare up due to being out of pain medications versus recent UTI Appreciate neurology input. MRI of brain and spine  without contrast showed stable cord lesions consistent with chronic MS. MRI of brain and spine with contrast on 03/02/2012 showed no acute evidence for MS. Discuss with neurology, acute MS may not showup on MRI, neurology recommended treating with 5 day course of IV steroids (till 03/06/2012). PT evaluation noted no PT needs. Will need outpatient evaluation with neurology for monthly tysabri infusion, patient reports that she is in the process of arranging followup with Dr. Anne Hahn.  Recent UTI UA positive prior to admission, continue nitrofurantoin, defined 7 days total as outpatient till 03/06/2012. Urine analysis on admission not suggestive of urinary tract infection. Fluconazole for prophylaxis to prevent superimposed yeast infection.  H/o DM2 off of meds at baseline  SSI resistant scale. Hemoglobin A1C 5.3 prior to admission, not diabetic. Suspect would not need any diabetic medications after discharge.  HTN Hypotension resolved. Stable. Continue home meds with hold parameters.  Morbid Obesity with possible obstructive sleep apnea Will arrange for outpatient sleep study.  Chronic pain Continue home medications. Gave the patient 5 day prescription of dilaudid until establishing care at pain clinic.  Prophylaxis SQ Heparin  Disposition Plan for discharge today after steroids.   LOS: 5 days  [name] A, MD 03/06/2012, 12:38 PM

## 2012-03-06 NOTE — Plan of Care (Signed)
Problem: Phase III Progression Outcomes Goal: IV/normal saline lock discontinued Outcome: Not Met (add Reason) Still getting iv steroids

## 2012-03-07 ENCOUNTER — Encounter: Payer: Self-pay | Admitting: Physical Medicine & Rehabilitation

## 2012-03-07 ENCOUNTER — Encounter
Payer: Medicare (Managed Care) | Attending: Physical Medicine & Rehabilitation | Admitting: Physical Medicine & Rehabilitation

## 2012-03-07 DIAGNOSIS — M47817 Spondylosis without myelopathy or radiculopathy, lumbosacral region: Secondary | ICD-10-CM | POA: Insufficient documentation

## 2012-03-07 DIAGNOSIS — M1710 Unilateral primary osteoarthritis, unspecified knee: Secondary | ICD-10-CM | POA: Insufficient documentation

## 2012-03-07 DIAGNOSIS — F419 Anxiety disorder, unspecified: Secondary | ICD-10-CM

## 2012-03-07 DIAGNOSIS — M47816 Spondylosis without myelopathy or radiculopathy, lumbar region: Secondary | ICD-10-CM | POA: Insufficient documentation

## 2012-03-07 DIAGNOSIS — IMO0002 Reserved for concepts with insufficient information to code with codable children: Secondary | ICD-10-CM | POA: Insufficient documentation

## 2012-03-07 DIAGNOSIS — F411 Generalized anxiety disorder: Secondary | ICD-10-CM

## 2012-03-07 DIAGNOSIS — E1142 Type 2 diabetes mellitus with diabetic polyneuropathy: Secondary | ICD-10-CM | POA: Insufficient documentation

## 2012-03-07 DIAGNOSIS — G35 Multiple sclerosis: Secondary | ICD-10-CM | POA: Insufficient documentation

## 2012-03-07 DIAGNOSIS — E1149 Type 2 diabetes mellitus with other diabetic neurological complication: Secondary | ICD-10-CM | POA: Insufficient documentation

## 2012-03-07 DIAGNOSIS — G8929 Other chronic pain: Secondary | ICD-10-CM | POA: Insufficient documentation

## 2012-03-07 MED ORDER — ALPRAZOLAM 1 MG PO TABS: 1.0000 mg | ORAL_TABLET | Freq: Every evening | ORAL | Status: DC | PRN

## 2012-03-07 MED ORDER — MORPHINE SULFATE ER 30 MG PO TBCR: 30.0000 mg | EXTENDED_RELEASE_TABLET | Freq: Three times a day (TID) | ORAL | Status: DC

## 2012-03-07 MED ORDER — DULOXETINE HCL 30 MG PO CPEP: 30.0000 mg | ORAL_CAPSULE | Freq: Every day | ORAL | Status: DC

## 2012-03-07 MED ORDER — HYDROMORPHONE HCL 8 MG PO TABS: 8.0000 mg | ORAL_TABLET | Freq: Two times a day (BID) | ORAL | Status: DC | PRN

## 2012-03-07 NOTE — Patient Instructions (Signed)
Please see your neurologist for follow up.

## 2012-03-07 NOTE — Progress Notes (Signed)
Subjective:    Patient ID: [name], female    DOB: 1961/05/12, 51 y.o.   MRN: [id number]  HPI  [name] is back regarding her chronic pain as it relates to her MS. She was in [facility] for over a year and was followed by a pain doctor/neurologist there. She states that she had steroid injections performed (hips/back?). She was placed on tysabri as well about a month ago. She was started on MS contin and dilaudid for pain control. She was using 90mg  MS contin per day and  Dilaudid 8mg  every 4 hours. She has been out of these meds for around a month. She is also out of her cymbalta and xanax.  She did not bring up the records from this practice.    Pain Inventory Average Pain 10 Pain Right Now 10 My pain is constant, sharp, burning, dull, stabbing, tingling and aching  In the last 24 hours, has pain interfered with the following? General activity 10 Relation with others 8 Enjoyment of life 10 What TIME of day is your pain at its worst? morning and evening Sleep (in general) Poor  Pain is worse with: walking, bending, sitting, standing and some activites Pain improves with: rest, medication and injections Relief from Meds: 7  Mobility use a cane how many minutes can you walk? 5 ability to climb steps?  yes do you drive?  yes  Function disabled: date disabled 2008 I need assistance with the following:  household duties and shopping  Neuro/Psych bladder control problems weakness numbness tremor tingling trouble walking spasms  Prior Studies Any changes since last visit?  no  Physicians involved in your care Any changes since last visit?  no   Family History  Problem Relation Age of Onset  . Diabetes Mother   . Hypertension Mother   . Stroke Father    History   Social History  . Marital Status: Single    Spouse Name: N/A    Number of Children: N/A  . Years of Education: N/A   Social History Main Topics  . Smoking status: Current Every Day Smoker -- 0.2  packs/day for 12 years    Types: Cigarettes  . Smokeless tobacco: Never Used  . Alcohol Use: No  . Drug Use: No  . Sexually Active:    Other Topics Concern  . None   Social History Narrative  . None   Past Surgical History  Procedure Date  . Joint replacement     LTK  . Abdominal hysterectomy     partial   Past Medical History  Diagnosis Date  . Multiple sclerosis exacerbation   . Hypertension   . Diabetes mellitus    BP 156/94  Pulse 64  Resp 14  Ht 5\' 3"  (1.6 m)  Wt 251 lb (113.853 kg)  BMI 44.46 kg/m2  SpO2 97%     Review of Systems  Constitutional: Positive for appetite change.  Musculoskeletal: Positive for myalgias, arthralgias and gait problem.  Neurological: Positive for weakness and numbness.  All other systems reviewed and are negative.       Objective:   Physical Exam GENERAL: The patient actually walked into the room today using her  cane.  MUSCULOSKELETAL: She walks with antalgia in the right, more in the left  side. Gait is wide based. She has 1+ edema still in the left lower  extremity trace on the right. She is healing left knee incision which  appears intact. There is some mild scarring and  swelling surrounding  the incision still. She remains hyperactive in reflexes with decreased  sensory exam to pinprick and light touch distally in all 4 limbs.  Strength is generally 4 to 5 out of 5 in all limbs with some pain  inhibition particularly at the left knee which still is in the realm of  3+ out of 5. She remains tender throughout the low back. Was limited with lumbar ROM in all plains. Back appeared to be tender with ambulation as well as transferring from a sitting to standing position. NEUROLOGIC: Cognitively, she is alert and appropriate. Cranial nerve  exam is intact.  HEART: Tachycardic, but likely is related to her recent walk down the  hall to the room.  CHEST: Clear.  ABDOMEN: Soft, nontender. She has lost weight, but still is    overweight.    ASSESSMENT:  1. Relapsing remitting multiple sclerosis.  2. Diabetic peripheral neuropathy.  3. Morbid obesity.  4. Osteoarthritis, left knee with meniscal injury.  5. Lumbar spondylosis with facet arthropathy.   PLAN:  1. I am willing to continue her MS contin at 30mg  q8. Will prescirbe dilaudid 8mg  q12 prn for breakthrough pain. We discussed at length the risks involved given her MS and the large doses of narcotics she was being prescribed in [place]. She is at high risk for adverse events given the possibility of sedation, increased confusion, etc. Her mother even admitted to me that she tends to be sitting around sleeping most of the day.  2.  Cymbalta 30mg  qam. Will look at potentially increasing to 60mg  at next visit. 3.  Xanax 1mg  qday prn for anxiety 4. She also needs ongoing treatment of her MS to help with  some of these secondary symptoms.She will be seeing Dr. Anne Bright apparently for resumption of her neuro care. She wants to restart tysabri which was initiated about a month ago in [place]. 5. We will see her back here ina bout a month. 30 minutes of face to face patient care time were spent during this visit. All questions were encouraged and answered.

## 2012-03-30 ENCOUNTER — Telehealth: Payer: Self-pay | Admitting: Family Medicine

## 2012-03-30 DIAGNOSIS — R350 Frequency of micturition: Secondary | ICD-10-CM

## 2012-03-30 NOTE — Telephone Encounter (Signed)
Please advise on Urology referral

## 2012-03-30 NOTE — Telephone Encounter (Signed)
Pt is needing to have a referral to a urologist and would like to have it done the same day and  time as her mother eliza house -a message for ms house has been put in to  Best number [phone]

## 2012-03-30 NOTE — Telephone Encounter (Signed)
Can we please give her a call and find out why she wants to see urology?  I can then do a referral if needed.  Thanks!

## 2012-03-31 NOTE — Telephone Encounter (Signed)
Ok, I will do a referral.  Please call and let her know

## 2012-03-31 NOTE — Telephone Encounter (Signed)
I spoke to patient, she states she has frequent urination, and she has to urinate again even after she has just emptied her bladder. Wakes up several times at night also to urinate.

## 2012-04-01 NOTE — Telephone Encounter (Signed)
Patient aware we will proceed with referral

## 2012-04-03 ENCOUNTER — Ambulatory Visit (HOSPITAL_BASED_OUTPATIENT_CLINIC_OR_DEPARTMENT_OTHER): Payer: Medicare Other | Attending: Internal Medicine | Admitting: Radiology

## 2012-04-03 VITALS — Ht 63.0 in | Wt 242.0 lb

## 2012-04-03 DIAGNOSIS — G4733 Obstructive sleep apnea (adult) (pediatric): Secondary | ICD-10-CM

## 2012-04-03 DIAGNOSIS — G4730 Sleep apnea, unspecified: Secondary | ICD-10-CM | POA: Insufficient documentation

## 2012-04-03 DIAGNOSIS — G4700 Insomnia, unspecified: Secondary | ICD-10-CM | POA: Insufficient documentation

## 2012-04-06 ENCOUNTER — Encounter
Payer: Medicare (Managed Care) | Attending: Physical Medicine & Rehabilitation | Admitting: Physical Medicine & Rehabilitation

## 2012-04-06 DIAGNOSIS — E1149 Type 2 diabetes mellitus with other diabetic neurological complication: Secondary | ICD-10-CM | POA: Insufficient documentation

## 2012-04-06 DIAGNOSIS — G35 Multiple sclerosis: Secondary | ICD-10-CM | POA: Insufficient documentation

## 2012-04-06 DIAGNOSIS — G8929 Other chronic pain: Secondary | ICD-10-CM | POA: Insufficient documentation

## 2012-04-06 DIAGNOSIS — E1142 Type 2 diabetes mellitus with diabetic polyneuropathy: Secondary | ICD-10-CM | POA: Insufficient documentation

## 2012-04-06 DIAGNOSIS — M1710 Unilateral primary osteoarthritis, unspecified knee: Secondary | ICD-10-CM | POA: Insufficient documentation

## 2012-04-06 DIAGNOSIS — M47817 Spondylosis without myelopathy or radiculopathy, lumbosacral region: Secondary | ICD-10-CM | POA: Insufficient documentation

## 2012-04-09 DIAGNOSIS — I4949 Other premature depolarization: Secondary | ICD-10-CM

## 2012-04-09 DIAGNOSIS — R0609 Other forms of dyspnea: Secondary | ICD-10-CM

## 2012-04-09 DIAGNOSIS — G4730 Sleep apnea, unspecified: Secondary | ICD-10-CM

## 2012-04-09 DIAGNOSIS — R0989 Other specified symptoms and signs involving the circulatory and respiratory systems: Secondary | ICD-10-CM

## 2012-04-09 DIAGNOSIS — G4700 Insomnia, unspecified: Secondary | ICD-10-CM

## 2012-04-09 NOTE — Procedures (Cosign Needed)
[name], [name]                 ACCOUNT NO.:  [id number]  MEDICAL RECORD NO.:  [id number]          PATIENT TYPE:  OUT  LOCATION:  SLEEP CENTER                 FACILITY:  [facility]  PHYSICIAN:  Clinton D. Maple Hudson, MD, FCCP, FACPDATE OF BIRTH:  February 01, 1960  DATE OF STUDY:  04/03/2012                           NOCTURNAL POLYSOMNOGRAM  REFERRING PHYSICIAN:  Andreas Blower, MD  REFERRING PHYSICIAN:  Andreas Blower, MD  INDICATION FOR STUDY:  Insomnia with sleep apnea.  EPWORTH SLEEPINESS SCORE:  11/24.  BMI 42.9, weight 242 pounds, height 63 inches, neck 17 inches.  MEDICATIONS:  Home medications are charted and reviewed.  SLEEP ARCHITECTURE:  Total sleep time 375 minutes with sleep efficiency 81.5%.  Stage I was 4%, stage II 78.5%.  Stage III 15.2%, REM 2.3% of total sleep time.  Sleep latency 20 minutes, REM latency 303 minutes. Awake after sleep onset 65 minutes.  Arousal index 4.6.  Bedtime medication:  None.  RESPIRATORY DATA:  Apnea/hypopnea index (AHI) 1.4 per hour.  A total of 9 events was scored, all was hypopneas and mostly with supine sleep position.  REM AHI 0.  There were insufficient numbers of events to qualify for split protocol CPAP titration.  OXYGEN DATA:  Moderate snoring with oxygen desaturation to a nadir of 89% and mean oxygen saturation through the study of 93.5% on room air.  CARDIAC DATA:  Sinus rhythm with occasional PVC.  MOVEMENT/PARASOMNIA:  No significant movement disturbance.  Bathroom x1.  IMPRESSION/RECOMMENDATION: 1. She had complained of difficulty initiating and maintaining sleep.     Sleep was well-sustained on the present study without medication,     noting an interval of wakefulness between 3:30 and 4:15 a.m. 2. Occasional respiratory event with sleep disturbance, within normal     limits.  AHI 1.4 per hour (the normal range     for adults is from 0-5 events per hour).  Moderate snoring with     oxygen desaturation to a nadir of 89% and mean  oxygen saturation     through the study of 93.5% on room air.     Clinton D. Maple Hudson, MD, [facility], FACP Diplomate, American Board of Sleep Medicine    CDY/MEDQ  D:  04/09/2012 10:10:11  T:  04/09/2012 22:42:56  Job:  [id number]

## 2012-04-13 ENCOUNTER — Encounter
Payer: Medicare Other | Attending: Physical Medicine and Rehabilitation | Admitting: Physical Medicine and Rehabilitation

## 2012-04-13 ENCOUNTER — Encounter: Payer: Self-pay | Admitting: Physical Medicine and Rehabilitation

## 2012-04-13 VITALS — BP 155/88 | HR 102 | Resp 14 | Ht 63.0 in | Wt 231.0 lb

## 2012-04-13 DIAGNOSIS — M129 Arthropathy, unspecified: Secondary | ICD-10-CM | POA: Insufficient documentation

## 2012-04-13 DIAGNOSIS — R209 Unspecified disturbances of skin sensation: Secondary | ICD-10-CM | POA: Insufficient documentation

## 2012-04-13 DIAGNOSIS — F411 Generalized anxiety disorder: Secondary | ICD-10-CM

## 2012-04-13 DIAGNOSIS — F17200 Nicotine dependence, unspecified, uncomplicated: Secondary | ICD-10-CM | POA: Insufficient documentation

## 2012-04-13 DIAGNOSIS — M47817 Spondylosis without myelopathy or radiculopathy, lumbosacral region: Secondary | ICD-10-CM

## 2012-04-13 DIAGNOSIS — M47816 Spondylosis without myelopathy or radiculopathy, lumbar region: Secondary | ICD-10-CM

## 2012-04-13 DIAGNOSIS — I10 Essential (primary) hypertension: Secondary | ICD-10-CM | POA: Insufficient documentation

## 2012-04-13 DIAGNOSIS — F419 Anxiety disorder, unspecified: Secondary | ICD-10-CM

## 2012-04-13 DIAGNOSIS — G8929 Other chronic pain: Secondary | ICD-10-CM | POA: Insufficient documentation

## 2012-04-13 DIAGNOSIS — M1710 Unilateral primary osteoarthritis, unspecified knee: Secondary | ICD-10-CM | POA: Insufficient documentation

## 2012-04-13 DIAGNOSIS — E1142 Type 2 diabetes mellitus with diabetic polyneuropathy: Secondary | ICD-10-CM | POA: Insufficient documentation

## 2012-04-13 DIAGNOSIS — IMO0002 Reserved for concepts with insufficient information to code with codable children: Secondary | ICD-10-CM

## 2012-04-13 DIAGNOSIS — Z96659 Presence of unspecified artificial knee joint: Secondary | ICD-10-CM | POA: Insufficient documentation

## 2012-04-13 DIAGNOSIS — M79609 Pain in unspecified limb: Secondary | ICD-10-CM | POA: Insufficient documentation

## 2012-04-13 DIAGNOSIS — G35 Multiple sclerosis: Secondary | ICD-10-CM | POA: Insufficient documentation

## 2012-04-13 DIAGNOSIS — E1149 Type 2 diabetes mellitus with other diabetic neurological complication: Secondary | ICD-10-CM | POA: Insufficient documentation

## 2012-04-13 MED ORDER — HYDROMORPHONE HCL 8 MG PO TABS: 8.0000 mg | ORAL_TABLET | Freq: Two times a day (BID) | ORAL | Status: DC | PRN

## 2012-04-13 MED ORDER — MORPHINE SULFATE ER 30 MG PO TBCR: 30.0000 mg | EXTENDED_RELEASE_TABLET | Freq: Three times a day (TID) | ORAL | Status: DC

## 2012-04-13 NOTE — Patient Instructions (Signed)
Continue with walking and exercises program.

## 2012-04-13 NOTE — Progress Notes (Signed)
Subjective:    Patient ID: [name], female    DOB: 02-Oct-1960, 51 y.o.   MRN: [id number]  HPI The patient complains about chronic pain from waist down to her toes bilateral. which radiates into. The patient also complains about numbness and tingling in the same distribution. The problem has gotten worse since last visit. The patient is asking me whether I could prescribe cannabis to her. [name] is back regarding her chronic pain as it relates to her MS. She was in [facility] for over a year and was followed by a pain doctor/neurologist there. She states that she had steroid injections performed (hips/back?). She was placed on tysabri as well about a month ago. She was started on MS contin and dilaudid for pain control. She was using 90mg  MS contin per day and Dilaudid 8mg  every 4 hours. She has been out of these meds for around a month. She is also out of her cymbalta and xanax.    Pain Inventory Average Pain 10 Pain Right Now 10 My pain is constant, sharp, burning, dull, stabbing, tingling and aching  In the last 24 hours, has pain interfered with the following? General activity 10 Relation with others 1 Enjoyment of life 10 What TIME of day is your pain at its worst? all of the time Sleep (in general) Poor  Pain is worse with: walking, bending, sitting, standing and some activites Pain improves with: rest and medication Relief from Meds: 3  Mobility walk with assistance use a cane ability to climb steps?  yes do you drive?  yes  Function disabled: date disabled   Neuro/Psych numbness tingling trouble walking spasms  Prior Studies Any changes since last visit?  no  Physicians involved in your care Any changes since last visit?  no   Family History  Problem Relation Age of Onset  . Diabetes Mother   . Hypertension Mother   . Stroke Father    History   Social History  . Marital Status: Single    Spouse Name: N/A    Number of Children: N/A  . Years of  Education: N/A   Social History Main Topics  . Smoking status: Current Every Day Smoker -- 0.2 packs/day for 12 years    Types: Cigarettes  . Smokeless tobacco: Never Used  . Alcohol Use: No  . Drug Use: No  . Sexually Active:    Other Topics Concern  . None   Social History Narrative  . None   Past Surgical History  Procedure Date  . Joint replacement     LTK  . Abdominal hysterectomy     partial   Past Medical History  Diagnosis Date  . Multiple sclerosis exacerbation   . Hypertension   . Diabetes mellitus    BP 155/88  Pulse 102  Resp 14  Ht 5\' 3"  (1.6 m)  Wt 231 lb (104.781 kg)  BMI 40.92 kg/m2  SpO2 97%    Review of Systems  Constitutional: Positive for fatigue.  Respiratory: Positive for cough.   Gastrointestinal: Positive for diarrhea.  Musculoskeletal: Positive for back pain and gait problem.       Spasms  Neurological: Positive for weakness and numbness.       Tingling  All other systems reviewed and are negative.       Objective:   Physical Exam  Constitutional: She is oriented to person, place, and time. She appears well-developed and well-nourished.  HENT:  Head: Normocephalic.  Neck: Neck supple.  Musculoskeletal: She exhibits tenderness.  Neurological: She is alert and oriented to person, place, and time.  Skin: Skin is warm and dry.  Psychiatric: She has a normal mood and affect.    Symmetric normal motor tone is noted throughout. Normal muscle bulk. Muscle testing reveals 5/5 muscle strength of the upper extremity, and 5/5 of the lower extremity on the right , 4/5 on the left. Full range of motion in upper and lower extremities.  Fine motor movements are normal in both hands.  DTR in the upper and lower extremity are present and symmetric 2+. No clonus is noted.  Patient arises from chair with mild difficulty. Wide based gait with a cane.Able to stand on heels and toes . Tandem walk is possible but unstable. No pronator drift.  Rhomberg negative. Good finger to nose and heel to shin testing. No tremor, dystaxia or dysmetria noted.        Assessment & Plan:  1. Relapsing remitting multiple sclerosis.  2. Diabetic peripheral neuropathy.  3. Morbid obesity.  4. Osteoarthritis, left knee with meniscal injury.  5. Lumbar spondylosis with facet arthropathy.  PLAN:  1. Continue her MS contin at 30mg  q8. Will prescirbe dilaudid 8mg  q12 prn for breakthrough pain. We discussed at length the risks involved given her MS and the large doses of narcotics she was being prescribed in [place]. She is at high risk for adverse events given the possibility of sedation, increased confusion, etc. Her mother even admitted to me that she tends to be sitting around sleeping most of the day.  2. Cymbalta 30mg  qam. Will look at potentially increasing to 60mg  at next visit.  3. Xanax 1mg  qday prn for anxiety  4. She also needs ongoing treatment of her MS to help with  some of these secondary symptoms.She will be seeing Dr. Anne Hahn apparently for resumption of her neuro care in January. She wants to restart tysabri which was initiated about a month ago in [place].  5. The patient is asking me whether I could prescribe cannabis to her. I told her that this is illegal in this state, and I would not do this anyhow, she wants to talk to Dr. Riley Kill about this. 6. We will see her back here ina bout a month.  All questions were encouraged and answered.

## 2012-04-18 ENCOUNTER — Ambulatory Visit: Payer: MEDICARE

## 2012-04-18 ENCOUNTER — Ambulatory Visit (INDEPENDENT_AMBULATORY_CARE_PROVIDER_SITE_OTHER): Payer: MEDICARE | Admitting: Family Medicine

## 2012-04-18 VITALS — BP 134/82 | HR 82 | Temp 98.8°F | Resp 18 | Wt 234.0 lb

## 2012-04-18 DIAGNOSIS — K529 Noninfective gastroenteritis and colitis, unspecified: Secondary | ICD-10-CM

## 2012-04-18 DIAGNOSIS — R059 Cough, unspecified: Secondary | ICD-10-CM

## 2012-04-18 DIAGNOSIS — R05 Cough: Secondary | ICD-10-CM

## 2012-04-18 DIAGNOSIS — R197 Diarrhea, unspecified: Secondary | ICD-10-CM

## 2012-04-18 MED ORDER — ALBUTEROL SULFATE HFA 108 (90 BASE) MCG/ACT IN AERS: 2.0000 | INHALATION_SPRAY | Freq: Four times a day (QID) | RESPIRATORY_TRACT | Status: DC | PRN

## 2012-04-18 MED ORDER — DOXYCYCLINE HYCLATE 100 MG PO TABS: 100.0000 mg | ORAL_TABLET | Freq: Two times a day (BID) | ORAL | Status: DC

## 2012-04-18 MED ORDER — BENZONATATE 100 MG PO CAPS: 100.0000 mg | ORAL_CAPSULE | Freq: Three times a day (TID) | ORAL | Status: DC | PRN

## 2012-04-18 MED ORDER — ALBUTEROL SULFATE (2.5 MG/3ML) 0.083% IN NEBU
2.5000 mg | INHALATION_SOLUTION | Freq: Once | RESPIRATORY_TRACT | Status: AC
  Administered 2012-04-18: 2.5 mg via RESPIRATORY_TRACT

## 2012-04-18 NOTE — Progress Notes (Signed)
[facility] [address] [place] [address] [phone]  Date:  04/18/2012   Name:  [name]   DOB:  02-06-1961   MRN:  [id number]  PCP:  Nestor Ramp    Chief Complaint: URI and med refills   History of Present Illness:  [name] is a 51 y.o. very pleasant female patient who presents with the following:  [name] is a morbidly obese woman with multiple medical problems including MS.  She re- established care with [place] in October of this year.  She was admitted to the hospital in October to receive therapy for MS; she was not able to receive outpatient therapy due to owing money at [facility]. She has not yet started back on her Tysabri  She is here today due to a cough.  She has noted the cough for about 2 weeks.  She has sinus and chest congestion.   She sometimes coughs up mucus.  She has "no energy at all."  It is "hard to sleep."   She has nasal congestion.   She has noted a subjective fever "off an on" and a "hot feeling on the inside."  Also complains of lack of appetite    No sore throat or earache.  She also notes "a lot" of diarrhea that comes and goes "when it wants to."  This has been the case for over a year.  She states that she had a colonoscopy in [place] but the results were normal.  She states that the GI doctor she saw was not able to explain her diarrhea and that it is not better with lomotil/    She states that she has not yet followed-up with neurology.  She thinks that she will be able to go later on this month- then we hope she will be able to go back on her .   She DID get in with pain management however.    She has tried alka- seltzer cold plus and robitussin so far.  She has noted some wheezing   Patient Active Problem List  Diagnosis  . DIABETES MELLITUS  . MORBID OBESITY  . Other chronic pain  . Multiple sclerosis  . BLURRED VISION  . HYPERTENSION  . ESOPHAGEAL REFLUX  . ACUTE CYSTITIS  .  DEGENERATION INTERVERTEBRAL DISC SITE UNSPEC  . INSOMNIA  . SKIN RASH  . TACHYCARDIA  . URINARY RETENTION  . LIVER FUNCTION TESTS, ABNORMAL, HX OF  . SMOKER  . SIMPLE CHRONIC BRONCHITIS  . DYSPNEA  . Lumbar spondylosis  . Osteoarthrosis, unspecified whether generalized or localized, lower leg  . Diabetic peripheral neuropathy    Past Medical History  Diagnosis Date  . Multiple sclerosis exacerbation   . Hypertension   . Diabetes mellitus     Past Surgical History  Procedure Date  . Joint replacement     LTK  . Abdominal hysterectomy     partial  . Tubal ligation     History  Substance Use Topics  . Smoking status: Current Every Day Smoker -- 0.2 packs/day for 12 years    Types: Cigarettes  . Smokeless tobacco: Never Used  . Alcohol Use: No    Family History  Problem Relation Age of Onset  . Diabetes Mother   . Hypertension Mother   . Stroke Father     No Known Allergies  Medication list has been reviewed and updated.  Current Outpatient Prescriptions on File Prior to Visit  Medication Sig Dispense Refill  . ALPRAZolam (XANAX) 1 MG tablet Take 1 tablet (1 mg total) by mouth at bedtime as needed. For anxiety.  30 tablet  1  . atorvastatin (LIPITOR) 80 MG tablet Take 1 tablet (80 mg total) by mouth daily.  90 tablet  3  . cloNIDine (CATAPRES) 0.2 MG tablet Take 1 tablet (0.2 mg total) by mouth 2 (two) times daily.  180 tablet  3  . diphenoxylate-atropine (LOMOTIL) 2.5-0.025 MG per tablet Take 1 tablet by mouth 2 (two) times daily. For diarrhea.  60 tablet  3  . DULoxetine (CYMBALTA) 30 MG capsule Take 1 capsule (30 mg total) by mouth daily.  30 capsule  3  . fluticasone (FLONASE) 50 MCG/ACT nasal spray Place 2 sprays into the nose daily.  16 g  11  . gabapentin (NEURONTIN) 800 MG tablet Take 1 tablet (800 mg total) by mouth 3 (three) times daily.  270 tablet  3  . hydrALAZINE (APRESOLINE) 100 MG tablet Take 100 mg by mouth 3 (three) times daily.      Marland Kitchen  HYDROmorphone (DILAUDID) 8 MG tablet Take 1 tablet (8 mg total) by mouth every 12 (twelve) hours as needed for pain.  60 tablet  0  . LORazepam (ATIVAN) 2 MG tablet Take 1 tablet (2 mg total) by mouth at bedtime as needed. For sleep.  30 tablet  2  . losartan (COZAAR) 100 MG tablet Take 1 tablet (100 mg total) by mouth daily.  90 tablet  3  . metoprolol (LOPRESSOR) 50 MG tablet Take 1 tablet (50 mg total) by mouth 2 (two) times daily.  180 tablet  3  . morphine (MS CONTIN) 30 MG 12 hr tablet Take 1 tablet (30 mg total) by mouth 3 (three) times daily.  90 tablet  0  . NIFEdipine (PROCARDIA-XL/ADALAT-CC/NIFEDICAL-XL) 30 MG 24 hr tablet Take 30 mg by mouth daily.      Marland Kitchen omeprazole (PRILOSEC) 20 MG capsule Take 1 capsule (20 mg total) by mouth daily.  90 capsule  3  . sucralfate (CARAFATE) 1 G tablet Take 1 tablet (1 g total) by mouth 2 (two) times daily.  90 tablet  3  . tiZANidine (ZANAFLEX) 4 MG tablet Take 1 tablet (4 mg total) by mouth every 8 (eight) hours as needed. For muscle spasm.  30 tablet  0  . topiramate (TOPAMAX) 100 MG tablet Take 1 tablet (100 mg total) by mouth daily.  90 tablet  3  . venlafaxine XR (EFFEXOR-XR) 37.5 MG 24 hr capsule Take 1 capsule (37.5 mg total) by mouth daily.  90 capsule  3  . Tamsulosin HCl (FLOMAX) 0.4 MG CAPS Take 1 capsule (0.4 mg total) by mouth daily.  90 capsule  3    Review of Systems:  As per HPI- otherwise negative.   Physical Examination: Filed Vitals:   04/18/12 0821  BP: 134/82  Pulse: 82  Temp: 98.8 F (37.1 C)  Resp: 18   Filed Vitals:   04/18/12 0821  Weight: 234 lb (106.142 kg)   There is no height on file to calculate BMI. Ideal Body Weight:    GEN: WDWN, NAD, Non-toxic, A & O x 3, obese HEENT: Atraumatic, Normocephalic. Neck supple. No masses, No LAD.  Bilateral TM wnl, oropharynx normal.  PEERL,EOMI.   Ears and Nose: No external deformity. CV: RRR, No M/G/R. No JVD. No thrill. No extra heart sounds. PULM: CTA B, no  crackles, rhonchi. No retractions. No resp. distress. No accessory  muscle use. She does have mild, diffuse wheezing bilaterally  ABD: S, NT, ND. No rebound. No HSM. EXTR: No c/c/e NEURO Normal gait.  PSYCH: Normally interactive. Conversant. Not depressed or anxious appearing.  Calm demeanor.   [facility] reading (PRIMARY) by  Dr. Patsy Lager.  CXR; cardiomegaly, otherwise normal.  No infiltrate  CHEST - 2 VIEW  Comparison: None.  Findings:  Examination degraded secondary to patient body habitus.  Normal cardiac silhouette and mediastinal contours given slightly reduced lung volumes. Minimal bibasilar opacities favored to represent atelectasis. No discrete focal airspace opacities. No definite pleural effusion or pneumothorax. No acute osseous abnormalities.  IMPRESSION: No acute cardiopulmonary disease. Specifically, no evidence of pneumonia.  Albuterol neb: significant improvement of her air movement and clearing of her wheezing.   Assessment and Plan: 1. Cough  DG Chest 2 View, albuterol (PROVENTIL) (2.5 MG/3ML) 0.083% nebulizer solution 2.5 mg, doxycycline (VIBRA-TABS) 100 MG tablet, albuterol (PROVENTIL HFA;VENTOLIN HFA) 108 (90 BASE) MCG/ACT inhaler, benzonatate (TESSALON) 100 MG capsule  2. Chronic diarrhea  Ambulatory referral to Gastroenterology    Will treat Dawn with doxycycline and albuterol inhalers today.  She wanted something for cough- will use tessalon perles as she is already on multiple narcotics for chronic pain.    Aleesha also has complaint of chronic diarrhea for over a year.  Will refer to a local GI doctor as I am not sure of the next step in this evaluation.  Asked her to try and get a copy of her last colonoscopy from a year ago.    Patient (or parent if minor) instructed to return to clinic or call if not better in 2-3 day(s).   Abbe Amsterdam, MD

## 2012-05-05 ENCOUNTER — Encounter
Payer: Medicare Other | Attending: Physical Medicine and Rehabilitation | Admitting: Physical Medicine and Rehabilitation

## 2012-05-25 ENCOUNTER — Encounter: Payer: Self-pay | Admitting: Physical Medicine and Rehabilitation

## 2012-05-25 ENCOUNTER — Encounter
Payer: Medicare Other | Attending: Physical Medicine and Rehabilitation | Admitting: Physical Medicine and Rehabilitation

## 2012-05-25 VITALS — BP 125/69 | HR 78 | Resp 14 | Ht 63.0 in | Wt 243.0 lb

## 2012-05-25 DIAGNOSIS — R52 Pain, unspecified: Secondary | ICD-10-CM

## 2012-05-25 DIAGNOSIS — M1710 Unilateral primary osteoarthritis, unspecified knee: Secondary | ICD-10-CM | POA: Insufficient documentation

## 2012-05-25 DIAGNOSIS — IMO0002 Reserved for concepts with insufficient information to code with codable children: Secondary | ICD-10-CM

## 2012-05-25 DIAGNOSIS — M47816 Spondylosis without myelopathy or radiculopathy, lumbar region: Secondary | ICD-10-CM

## 2012-05-25 DIAGNOSIS — M79609 Pain in unspecified limb: Secondary | ICD-10-CM

## 2012-05-25 DIAGNOSIS — F411 Generalized anxiety disorder: Secondary | ICD-10-CM

## 2012-05-25 DIAGNOSIS — Z5181 Encounter for therapeutic drug level monitoring: Secondary | ICD-10-CM

## 2012-05-25 DIAGNOSIS — F17200 Nicotine dependence, unspecified, uncomplicated: Secondary | ICD-10-CM | POA: Insufficient documentation

## 2012-05-25 DIAGNOSIS — R279 Unspecified lack of coordination: Secondary | ICD-10-CM | POA: Insufficient documentation

## 2012-05-25 DIAGNOSIS — E1142 Type 2 diabetes mellitus with diabetic polyneuropathy: Secondary | ICD-10-CM

## 2012-05-25 DIAGNOSIS — E1149 Type 2 diabetes mellitus with other diabetic neurological complication: Secondary | ICD-10-CM | POA: Insufficient documentation

## 2012-05-25 DIAGNOSIS — M79606 Pain in leg, unspecified: Secondary | ICD-10-CM

## 2012-05-25 DIAGNOSIS — G35 Multiple sclerosis: Secondary | ICD-10-CM | POA: Insufficient documentation

## 2012-05-25 DIAGNOSIS — F419 Anxiety disorder, unspecified: Secondary | ICD-10-CM

## 2012-05-25 DIAGNOSIS — G8929 Other chronic pain: Secondary | ICD-10-CM | POA: Insufficient documentation

## 2012-05-25 DIAGNOSIS — R209 Unspecified disturbances of skin sensation: Secondary | ICD-10-CM | POA: Insufficient documentation

## 2012-05-25 DIAGNOSIS — I10 Essential (primary) hypertension: Secondary | ICD-10-CM | POA: Insufficient documentation

## 2012-05-25 DIAGNOSIS — M47817 Spondylosis without myelopathy or radiculopathy, lumbosacral region: Secondary | ICD-10-CM

## 2012-05-25 MED ORDER — HYDROMORPHONE HCL 8 MG PO TABS: 8.0000 mg | ORAL_TABLET | Freq: Two times a day (BID) | ORAL | Status: DC | PRN

## 2012-05-25 MED ORDER — MORPHINE SULFATE ER 30 MG PO TBCR: 30.0000 mg | EXTENDED_RELEASE_TABLET | Freq: Three times a day (TID) | ORAL | Status: DC

## 2012-05-25 NOTE — Patient Instructions (Signed)
Stay as active as pain and safety allows.

## 2012-05-25 NOTE — Progress Notes (Signed)
Subjective:    Patient ID: [name], female    DOB: 1960-11-30, 52 y.o.   MRN: [id number]  HPI The patient complains about chronic pain from waist down to her toes bilateral.  The patient also complains about numbness and tingling in the same distribution. The patient complains about increased balance difficulties The problem has gotten worse since last visit. The patient is asking me whether I could prescribe cannabis to her.  [name] is back regarding her chronic pain as it relates to her MS. She was in [facility] for over a year and was followed by a pain doctor/neurologist there. She states that she had steroid injections performed (hips/back?). She was placed on tysabri as well about a month ago. She was started on MS contin and dilaudid for pain control.    Pain Inventory Average Pain 8 Pain Right Now 10 My pain is constant, sharp, burning, stabbing, tingling and aching  In the last 24 hours, has pain interfered with the following? General activity 9 Relation with others 5 Enjoyment of life 9 What TIME of day is your pain at its worst? night Sleep (in general) Poor  Pain is worse with: walking, bending, standing and some activites Pain improves with: rest, medication and injections Relief from Meds: 8  Mobility use a cane ability to climb steps?  yes do you drive?  yes transfers alone  Function disabled: date disabled   Neuro/Psych bladder control problems weakness numbness tingling spasms  Prior Studies Any changes since last visit?  no  Physicians involved in your care Any changes since last visit?  no   Family History  Problem Relation Age of Onset  . Diabetes Mother   . Hypertension Mother   . Stroke Father    History   Social History  . Marital Status: Single    Spouse Name: N/A    Number of Children: N/A  . Years of Education: N/A   Social History Main Topics  . Smoking status: Current Every Day Smoker -- 0.2 packs/day for 12 years    Types:  Cigarettes  . Smokeless tobacco: Never Used  . Alcohol Use: No  . Drug Use: No  . Sexually Active:    Other Topics Concern  . None   Social History Narrative  . None   Past Surgical History  Procedure Date  . Joint replacement     LTK  . Abdominal hysterectomy     partial  . Tubal ligation    Past Medical History  Diagnosis Date  . Multiple sclerosis exacerbation   . Hypertension   . Diabetes mellitus    BP 125/69  Pulse 78  Resp 14  Ht 5\' 3"  (1.6 m)  Wt 243 lb (110.224 kg)  BMI 43.05 kg/m2  SpO2 95%     Review of Systems  Constitutional: Positive for unexpected weight change.  Gastrointestinal: Positive for diarrhea.  Musculoskeletal: Positive for myalgias and arthralgias.  Neurological: Positive for weakness and numbness.  All other systems reviewed and are negative.       Objective:   Physical Exam Constitutional: She is oriented to person, place, and time. She appears well-developed and well-nourished.  HENT:  Head: Normocephalic.  Neck: Neck supple.  Musculoskeletal: She exhibits tenderness.  Neurological: She is alert and oriented to person, place, and time.  Skin: Skin is warm and dry.  Psychiatric: She has a normal mood and affect.   Symmetric normal motor tone is noted throughout. Normal muscle bulk. Muscle testing  reveals 5/5 muscle strength of the upper extremity, and 5/5 of the lower extremity on the right , 4/5 on the left. Full range of motion in upper and lower extremities. Fine motor movements are normal in both hands.  DTR in the upper and lower extremity are present and symmetric 2+. No clonus is noted.  Patient arises from chair with mild difficulty. Wide based gait with a cane.Able to stand on heels and toes . Tandem walk is possible but unstable. No pronator drift. Rhomberg negative.  Good finger to nose and heel to shin testing. No tremor, dystaxia or dysmetria noted.         Assessment & Plan:  1. Relapsing remitting multiple  sclerosis.  2. Diabetic peripheral neuropathy.  3. Morbid obesity.  4. Osteoarthritis, left knee with meniscal injury.  5. Lumbar spondylosis with facet arthropathy.  PLAN:  1. Continue her MS contin at 30mg  q8. Will prescirbe dilaudid 8mg  q12 prn for breakthrough pain. We discussed at length the risks involved given her MS and the large doses of narcotics she was being prescribed in [place]. She is at high risk for adverse events given the possibility of sedation, increased confusion, etc. Her mother even admitted to me that she tends to be sitting around sleeping most of the day.  2. Cymbalta 30mg  qam. Will look at potentially increasing to 60mg  at next visit.  3. Xanax 1mg  qday prn for anxiety  4. She also needs ongoing treatment of her MS to help with  some of these secondary symptoms.She stated, that shewill be seeing Dr. Anne Hahn apparently for resumption of her neuro care in January, but she did not follow up with this because of financial reasons. She states, that she is working on seeing a different neurologist, where she does not have any open bills. She states, that she had a controlled fall, and that she has more balance problems because of her MS. I ordered PT for balance training, at first the patient refused, then she agreed to do PT. 5. The patient is asking me whether I could prescribe cannabis to her. I told her that this is illegal in this state, and I would not do this anyhow, she wants to talk to Dr. Riley Kill about this, and other medication changes.  6. Dr. Riley Kill will see her back here in a month. All questions were encouraged and answered.

## 2012-06-06 ENCOUNTER — Ambulatory Visit (INDEPENDENT_AMBULATORY_CARE_PROVIDER_SITE_OTHER): Payer: MEDICARE | Admitting: Family Medicine

## 2012-06-06 ENCOUNTER — Ambulatory Visit: Payer: MEDICARE

## 2012-06-06 VITALS — BP 140/82 | HR 87 | Temp 98.9°F | Resp 18 | Ht 63.25 in | Wt 243.6 lb

## 2012-06-06 DIAGNOSIS — M25569 Pain in unspecified knee: Secondary | ICD-10-CM

## 2012-06-06 DIAGNOSIS — G8929 Other chronic pain: Secondary | ICD-10-CM

## 2012-06-06 DIAGNOSIS — R404 Transient alteration of awareness: Secondary | ICD-10-CM

## 2012-06-06 DIAGNOSIS — R400 Somnolence: Secondary | ICD-10-CM

## 2012-06-06 DIAGNOSIS — H00019 Hordeolum externum unspecified eye, unspecified eyelid: Secondary | ICD-10-CM

## 2012-06-06 MED ORDER — ERYTHROMYCIN 5 MG/GM OP OINT: TOPICAL_OINTMENT | Freq: Four times a day (QID) | OPHTHALMIC | Status: DC

## 2012-06-06 NOTE — Patient Instructions (Addendum)
Use the erythromycin ointment for your eye- apply a small amount about 4x a day.  Apply hot compresses to your eye frequently.  If your eye is not better in a couple of days please let me know.    Your knee x-rays look ok.  If your knee continues to trouble you please contact your orthopedist at [facility] orthopedics

## 2012-06-06 NOTE — Progress Notes (Signed)
[facility] [address], [place] [address] [phone]  Date:  06/06/2012   Name:  [name]   DOB:  Jul 08, 1960   MRN:  [id number]  PCP:  Nestor Ramp    Chief Complaint: Facial Swelling and Knee Pain   History of Present Illness:  [name] is a 52 y.o. very pleasant female patient who presents with the following: Chronic pain and possible stye on right eye  See assessment and plan from pain management, 05/25/12: 1. Relapsing remitting multiple sclerosis.  2. Diabetic peripheral neuropathy.  3. Morbid obesity.  4. Osteoarthritis, left knee with meniscal injury.  5. Lumbar spondylosis with facet arthropathy.  PLAN:  1. Continue her MS contin at 30mg  q8. Will prescirbe dilaudid 8mg  q12 prn for breakthrough pain. We discussed at length the risks involved given her MS and the large doses of narcotics she was being prescribed in [place]. She is at high risk for adverse events given the possibility of sedation, increased confusion, etc. Her mother even admitted to me that she tends to be sitting around sleeping most of the day.  2. Cymbalta 30mg  qam. Will look at potentially increasing to 60mg  at next visit.  3. Xanax 1mg  qday prn for anxiety  4. She also needs ongoing treatment of her MS to help with  some of these secondary symptoms.She stated, that shewill be seeing Dr. Anne Hahn apparently for resumption of her neuro care in January, but she did not follow up with this because of financial reasons. She states, that she is working on seeing a different neurologist, where she does not have any open bills.  She states, that she had a controlled fall, and that she has more balance problems because of her MS. I ordered PT for balance training, at first the patient refused, then she agreed to do PT.  5. The patient is asking me whether I could prescribe cannabis to her. I told her that this is illegal in this state, and I would not do this anyhow, she  wants to talk to Dr. Riley Kill about this, and other medication changes.  6. Dr. Riley Kill will see her back here in a month. All questions were encouraged and answered.   Today she is here with a concern about her right eye.  About 2 weeks ago she noted a ?stye starting on her right eye lid.  However, now she thinks she has a possible dilated blood vessel on her eye lid.  She also notes that she is producing some crusting/ discharge from the eye.    She also hurts "from my waist down."  This is not new- see above She also fell about 2 weeks ago onto her left knee.  She had a left knee replacement in the past.  This was done in 2011 by a doctor with [facility] ortho.  She has not brought this to their attention.  The knee was doing well prior to her fall.    She has not been to see her neurologist as of late. She is having a hard time finding a neurologist who she can see.  She did have a sleep study done but does not know what the results were.    She wears glasses- not contacts.  Her vision is normal per her report, normal vision screening today Patient Active Problem List  Diagnosis  . DIABETES MELLITUS  . MORBID OBESITY  . Other chronic pain  . Multiple sclerosis  .  BLURRED VISION  . HYPERTENSION  . ESOPHAGEAL REFLUX  . ACUTE CYSTITIS  . DEGENERATION INTERVERTEBRAL DISC SITE UNSPEC  . INSOMNIA  . SKIN RASH  . TACHYCARDIA  . URINARY RETENTION  . LIVER FUNCTION TESTS, ABNORMAL, HX OF  . SMOKER  . SIMPLE CHRONIC BRONCHITIS  . DYSPNEA  . Lumbar spondylosis  . Osteoarthrosis, unspecified whether generalized or localized, lower leg  . Diabetic peripheral neuropathy    Past Medical History  Diagnosis Date  . Multiple sclerosis exacerbation   . Hypertension   . Diabetes mellitus     Past Surgical History  Procedure Date  . Joint replacement     LTK  . Abdominal hysterectomy     partial  . Tubal ligation     History  Substance Use Topics  . Smoking status: Current Every Day  Smoker -- 0.2 packs/day for 12 years    Types: Cigarettes  . Smokeless tobacco: Never Used  . Alcohol Use: No    Family History  Problem Relation Age of Onset  . Diabetes Mother   . Hypertension Mother   . Stroke Father     No Known Allergies  Medication list has been reviewed and updated.  Current Outpatient Prescriptions on File Prior to Visit  Medication Sig Dispense Refill  . albuterol (PROVENTIL HFA;VENTOLIN HFA) 108 (90 BASE) MCG/ACT inhaler Inhale 2 puffs into the lungs every 6 (six) hours as needed for wheezing.  1 Inhaler  0  . ALPRAZolam (XANAX) 1 MG tablet Take 1 tablet (1 mg total) by mouth at bedtime as needed. For anxiety.  30 tablet  1  . atorvastatin (LIPITOR) 80 MG tablet Take 1 tablet (80 mg total) by mouth daily.  90 tablet  3  . cloNIDine (CATAPRES) 0.2 MG tablet Take 1 tablet (0.2 mg total) by mouth 2 (two) times daily.  180 tablet  3  . diphenoxylate-atropine (LOMOTIL) 2.5-0.025 MG per tablet Take 1 tablet by mouth 2 (two) times daily. For diarrhea.  60 tablet  3  . DULoxetine (CYMBALTA) 30 MG capsule Take 1 capsule (30 mg total) by mouth daily.  30 capsule  3  . fluticasone (FLONASE) 50 MCG/ACT nasal spray Place 2 sprays into the nose daily.  16 g  11  . gabapentin (NEURONTIN) 800 MG tablet Take 1 tablet (800 mg total) by mouth 3 (three) times daily.  270 tablet  3  . hydrALAZINE (APRESOLINE) 100 MG tablet Take 100 mg by mouth 3 (three) times daily.      Marland Kitchen LORazepam (ATIVAN) 2 MG tablet Take 1 tablet (2 mg total) by mouth at bedtime as needed. For sleep.  30 tablet  2  . losartan (COZAAR) 100 MG tablet Take 1 tablet (100 mg total) by mouth daily.  90 tablet  3  . metoprolol (LOPRESSOR) 50 MG tablet Take 1 tablet (50 mg total) by mouth 2 (two) times daily.  180 tablet  3  . morphine (MS CONTIN) 30 MG 12 hr tablet Take 1 tablet (30 mg total) by mouth 3 (three) times daily.  90 tablet  0  . NIFEdipine (PROCARDIA-XL/ADALAT-CC/NIFEDICAL-XL) 30 MG 24 hr tablet Take 30  mg by mouth daily.      Marland Kitchen omeprazole (PRILOSEC) 20 MG capsule Take 1 capsule (20 mg total) by mouth daily.  90 capsule  3  . sucralfate (CARAFATE) 1 G tablet Take 1 tablet (1 g total) by mouth 2 (two) times daily.  90 tablet  3  . Tamsulosin HCl (FLOMAX) 0.4  MG CAPS Take 1 capsule (0.4 mg total) by mouth daily.  90 capsule  3  . tiZANidine (ZANAFLEX) 4 MG tablet Take 1 tablet (4 mg total) by mouth every 8 (eight) hours as needed. For muscle spasm.  30 tablet  0  . topiramate (TOPAMAX) 100 MG tablet Take 1 tablet (100 mg total) by mouth daily.  90 tablet  3  . venlafaxine XR (EFFEXOR-XR) 37.5 MG 24 hr capsule Take 1 capsule (37.5 mg total) by mouth daily.  90 capsule  3  . benzonatate (TESSALON) 100 MG capsule Take 1 capsule (100 mg total) by mouth 3 (three) times daily as needed for cough.  40 capsule  0  . HYDROmorphone (DILAUDID) 8 MG tablet Take 1 tablet (8 mg total) by mouth every 12 (twelve) hours as needed for pain.  60 tablet  0    Review of Systems:  As per HPI- otherwise negative.   Physical Examination: Filed Vitals:   06/06/12 1059  BP: 140/82  Pulse: 87  Temp: 98.9 F (37.2 C)  Resp: 18   Filed Vitals:   06/06/12 1059  Height: 5' 3.25" (1.607 m)  Weight: 243 lb 9.6 oz (110.496 kg)   Body mass index is 42.81 kg/(m^2). Ideal Body Weight: Weight in (lb) to have BMI = 25: 142   GEN: WDWN, NAD, Non-toxic, A & O x 3, morbid obesity HEENT: Atraumatic, Normocephalic. Neck supple. No masses, No LAD. Right eyelid shows a stye under the upper lid.  PEERL, EOMI.   Ears and Nose: No external deformity. CV: RRR, No M/G/R. No JVD. No thrill. No extra heart sounds. PULM: CTA B, no wheezes, crackles, rhonchi. No retractions. No resp. distress. No accessory muscle use. ABD: S, NT, ND EXTR: No c/c/e NEURO Normal gait.  PSYCH: Normally interactive. Conversant. Not depressed or anxious appearing.  Calm demeanor.  Left knee: joint laxity consistent with artificial joint, otherwise  normal   [facility] reading (PRIMARY) by  Dr. Patsy Lager. Left knee:  Status- post total knee, otherwise normal, hardware in place  LEFT KNEE - COMPLETE 4+ VIEW  Comparison: None.  Findings: Changes of left knee replacement. No hardware or bony complicating feature. 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 No fracture, subluxation or dislocation. No joint effusion.  IMPRESSION: Left knee replacement. No acute findings.  Assessment and Plan: 1. Knee pain  DG Knee Complete 4 Views Left  2. Stye  erythromycin ophthalmic ointment  3. Chronic pain    4. Somnolence     Treat stye with erythromcin ointment and warm compresses.  I am afraid I do not have much more to offer regarding her chronic pain.  Did encourage her to continue to try and lose weight.  She did have a sleep study last year but is not sure what the results showed.  She states she did not hear any follow- up information about this study.  I will look into this for her.  She will also consult with her orthopedist if her knee pain becomes chronic   COPLAND,JESSICA, MD  Sent a message to Dr. Jetty Duhamel regarding her sleep study and will be in touch as soon as I hear back

## 2012-06-08 ENCOUNTER — Encounter: Payer: Self-pay | Admitting: Family Medicine

## 2012-06-15 ENCOUNTER — Ambulatory Visit: Payer: Medicare (Managed Care) | Admitting: Physical Therapy

## 2012-06-16 ENCOUNTER — Ambulatory Visit: Payer: Medicare (Managed Care) | Admitting: Physical Therapy

## 2012-06-21 ENCOUNTER — Ambulatory Visit
Payer: Medicare Other | Attending: Physical Medicine and Rehabilitation | Admitting: Rehabilitative and Restorative Service Providers"

## 2012-06-22 ENCOUNTER — Encounter: Payer: Self-pay | Admitting: Physical Medicine and Rehabilitation

## 2012-06-22 ENCOUNTER — Encounter
Payer: Medicare Other | Attending: Physical Medicine and Rehabilitation | Admitting: Physical Medicine and Rehabilitation

## 2012-06-22 ENCOUNTER — Encounter: Payer: Medicare (Managed Care) | Admitting: Physical Medicine & Rehabilitation

## 2012-06-22 VITALS — BP 103/61 | HR 80 | Resp 14 | Ht 63.0 in | Wt 240.0 lb

## 2012-06-22 DIAGNOSIS — G8929 Other chronic pain: Secondary | ICD-10-CM | POA: Insufficient documentation

## 2012-06-22 DIAGNOSIS — M79605 Pain in left leg: Secondary | ICD-10-CM

## 2012-06-22 DIAGNOSIS — F17200 Nicotine dependence, unspecified, uncomplicated: Secondary | ICD-10-CM | POA: Insufficient documentation

## 2012-06-22 DIAGNOSIS — M545 Low back pain, unspecified: Secondary | ICD-10-CM

## 2012-06-22 DIAGNOSIS — E1142 Type 2 diabetes mellitus with diabetic polyneuropathy: Secondary | ICD-10-CM | POA: Insufficient documentation

## 2012-06-22 DIAGNOSIS — E1149 Type 2 diabetes mellitus with other diabetic neurological complication: Secondary | ICD-10-CM | POA: Insufficient documentation

## 2012-06-22 DIAGNOSIS — Z96659 Presence of unspecified artificial knee joint: Secondary | ICD-10-CM | POA: Insufficient documentation

## 2012-06-22 DIAGNOSIS — G35 Multiple sclerosis: Secondary | ICD-10-CM | POA: Insufficient documentation

## 2012-06-22 DIAGNOSIS — F419 Anxiety disorder, unspecified: Secondary | ICD-10-CM

## 2012-06-22 DIAGNOSIS — M1710 Unilateral primary osteoarthritis, unspecified knee: Secondary | ICD-10-CM | POA: Insufficient documentation

## 2012-06-22 DIAGNOSIS — IMO0002 Reserved for concepts with insufficient information to code with codable children: Secondary | ICD-10-CM

## 2012-06-22 DIAGNOSIS — F411 Generalized anxiety disorder: Secondary | ICD-10-CM

## 2012-06-22 DIAGNOSIS — M47816 Spondylosis without myelopathy or radiculopathy, lumbar region: Secondary | ICD-10-CM

## 2012-06-22 DIAGNOSIS — M47817 Spondylosis without myelopathy or radiculopathy, lumbosacral region: Secondary | ICD-10-CM

## 2012-06-22 MED ORDER — HYDROMORPHONE HCL 8 MG PO TABS: 8.0000 mg | ORAL_TABLET | Freq: Two times a day (BID) | ORAL | Status: DC | PRN

## 2012-06-22 MED ORDER — MORPHINE SULFATE ER 30 MG PO TBCR: 30.0000 mg | EXTENDED_RELEASE_TABLET | Freq: Three times a day (TID) | ORAL | Status: DC

## 2012-06-22 MED ORDER — ALPRAZOLAM 1 MG PO TABS: 1.0000 mg | ORAL_TABLET | Freq: Every evening | ORAL | Status: DC | PRN

## 2012-06-22 NOTE — Progress Notes (Signed)
Subjective:    Patient ID: [name], female    DOB: 1960-12-18, 52 y.o.   MRN: [id number]  HPI The patient complains about chronic pain from waist down to her toes bilateral. The patient also complains about numbness and tingling in the same distribution. The patient complains about increased balance difficulties  The problem has gotten worse since last visit. The patient is asking me whether I could prescribe cannabis to her.  [name] is back regarding her chronic pain as it relates to her MS. She was in [facility] for over a year and was followed by a pain doctor/neurologist there. She states that she had steroid injections performed, into her back, which have helped. She was placed on tysabri as well about a month ago. She is not taking the Tysabri anymore, she has not followed up with Dr. Anne Hahn, neurologist yet. She was started on MS contin and dilaudid for pain control.   Pain Inventory Average Pain 8 Pain Right Now 10 My pain is sharp, burning, dull, stabbing, tingling and aching  In the last 24 hours, has pain interfered with the following? General activity 10 Relation with others 8 Enjoyment of life 10 What TIME of day is your pain at its worst? varies Sleep (in general) Poor  Pain is worse with: bending, standing and some activites Pain improves with: rest and medication Relief from Meds: 8  Mobility use a cane  Function disabled: date disabled 2008  Neuro/Psych bladder control problems bowel control problems weakness numbness tingling trouble walking spasms  Prior Studies Any changes since last visit?  no  Physicians involved in your care Any changes since last visit?  no   Family History  Problem Relation Age of Onset  . Diabetes Mother   . Hypertension Mother   . Stroke Father    History   Social History  . Marital Status: Single    Spouse Name: N/A    Number of Children: N/A  . Years of Education: N/A   Social History Main Topics  . Smoking  status: Current Every Day Smoker -- 0.2 packs/day for 12 years    Types: Cigarettes  . Smokeless tobacco: Never Used  . Alcohol Use: No  . Drug Use: No  . Sexually Active:    Other Topics Concern  . None   Social History Narrative  . None   Past Surgical History  Procedure Date  . Joint replacement     LTK  . Abdominal hysterectomy     partial  . Tubal ligation    Past Medical History  Diagnosis Date  . Multiple sclerosis exacerbation   . Hypertension   . Diabetes mellitus    BP 103/61  Pulse 80  Resp 14  Ht 5\' 3"  (1.6 m)  Wt 240 lb (108.863 kg)  BMI 42.51 kg/m2  SpO2 90%     Review of Systems  Constitutional: Positive for appetite change.  Gastrointestinal: Positive for diarrhea.  Musculoskeletal: Positive for back pain and gait problem.  Neurological: Positive for weakness and numbness.  All other systems reviewed and are negative.       Objective:   Physical Exam Constitutional: She is oriented to person, place, and time. She appears well-developed and well-nourished.  HENT:  Head: Normocephalic.  Neck: Neck supple.  Musculoskeletal: She exhibits tenderness.  Neurological: She is alert and oriented to person, place, and time.  Skin: Skin is warm and dry.  Psychiatric: She has a normal mood and affect.  Symmetric  normal motor tone is noted throughout. Normal muscle bulk. Muscle testing reveals 5/5 muscle strength of the upper extremity, and 5/5 of the lower extremity on the right , 4/5 on the left. Full range of motion in upper and lower extremities. Fine motor movements are normal in both hands.  DTR in the upper and lower extremity are present and symmetric 2+. No clonus is noted.  Patient arises from chair with mild difficulty. Wide based gait with a cane.Able to stand on heels and toes . Tandem walk is possible but unstable. No pronator drift. Rhomberg negative.  Good finger to nose and heel to shin testing. No tremor, dystaxia or dysmetria noted.          Assessment & Plan:  1. Relapsing remitting multiple sclerosis.  2. Diabetic peripheral neuropathy.  3. Morbid obesity.  4. Osteoarthritis, left knee with meniscal injury.  5. Lumbar spondylosis with facet arthropathy.  PLAN:  1. Continue her MS contin at 30mg  q8. Will prescirbe dilaudid 8mg  q12 prn for breakthrough pain. We discussed at length the risks involved given her MS and the large doses of narcotics she was being prescribed in [place]. She is at high risk for adverse events given the possibility of sedation, increased confusion, etc. Her mother even admitted to me that she tends to be sitting around sleeping most of the day.  2. Cymbalta 30mg  qam.  3. Xanax 1mg  qday prn for anxiety  4. She also needs ongoing treatment of her MS to help with  some of these secondary symptoms.She stated at her last visit, that she will be seeing Dr. Anne Hahn apparently for resumption of her neuro care in January, but she did not follow up with this. She states, that she had a controlled fall, and that she has more balance problems because of her MS. I ordered PT for balance training, at first the patient refused, then she agreed to do PT.  5. The patient is asking me whether I could prescribe cannabis to her. I told her that this is illegal in this state, and I would not do this anyhow, she wants to talk to Dr. Riley Kill about this, and other medication changes.  6. Dr. Riley Kill will see her back here in a month. All questions were encouraged and answered. We asked her to send [place] her MRI report about her L-spine, , the office in [place] sent [place] all her notes, but there was no MRI report, I ordered a new MRI of her L-spine, because of her increased symptoms, and because she wanted to have injections, they had helped in the past, but we need an MRI to administer those.

## 2012-06-22 NOTE — Patient Instructions (Signed)
Try to stay as active as tolerated and as it is safe.

## 2012-07-04 ENCOUNTER — Ambulatory Visit
Admission: RE | Admit: 2012-07-04 | Discharge: 2012-07-04 | Disposition: A | Discharge status: Home / Self Care | Payer: Medicare Other | Source: Ambulatory Visit | Attending: Physical Medicine and Rehabilitation | Admitting: Physical Medicine and Rehabilitation

## 2012-07-04 DIAGNOSIS — M47816 Spondylosis without myelopathy or radiculopathy, lumbar region: Secondary | ICD-10-CM

## 2012-07-04 DIAGNOSIS — M545 Low back pain: Secondary | ICD-10-CM

## 2012-07-04 DIAGNOSIS — M79605 Pain in left leg: Secondary | ICD-10-CM

## 2012-07-08 ENCOUNTER — Telehealth: Payer: Self-pay

## 2012-07-08 ENCOUNTER — Telehealth: Payer: Self-pay | Admitting: *Deleted

## 2012-07-08 NOTE — Telephone Encounter (Signed)
Those would be lumbar injections, which Dr. Doroteo Bradford would do, I will talk to him so he can look at her recent MRI, and decide which kind he would do.

## 2012-07-08 NOTE — Telephone Encounter (Signed)
Notified Ms [name] has to talk to Dr Wynn Banker and then they can talk about scheduling the procedure.

## 2012-07-08 NOTE — Telephone Encounter (Signed)
Patient informed of consistent MRI results.

## 2012-07-08 NOTE — Telephone Encounter (Signed)
[name] wants to know if she can get the shot from [name] at her appt on 07/19/12. She says [name] will know what she is talking about.

## 2012-07-18 ENCOUNTER — Other Ambulatory Visit: Payer: Self-pay | Admitting: Physical Medicine & Rehabilitation

## 2012-07-19 ENCOUNTER — Encounter
Payer: Medicare Other | Attending: Physical Medicine and Rehabilitation | Admitting: Physical Medicine and Rehabilitation

## 2012-07-19 ENCOUNTER — Encounter: Payer: Medicare Other | Admitting: Physical Medicine & Rehabilitation

## 2012-07-19 ENCOUNTER — Encounter: Payer: Self-pay | Admitting: Physical Medicine and Rehabilitation

## 2012-07-19 VITALS — BP 104/47 | HR 70 | Resp 14 | Ht 63.0 in | Wt 240.0 lb

## 2012-07-19 DIAGNOSIS — E1142 Type 2 diabetes mellitus with diabetic polyneuropathy: Secondary | ICD-10-CM

## 2012-07-19 DIAGNOSIS — G35 Multiple sclerosis: Secondary | ICD-10-CM

## 2012-07-19 DIAGNOSIS — M47817 Spondylosis without myelopathy or radiculopathy, lumbosacral region: Secondary | ICD-10-CM

## 2012-07-19 DIAGNOSIS — M1710 Unilateral primary osteoarthritis, unspecified knee: Secondary | ICD-10-CM | POA: Insufficient documentation

## 2012-07-19 DIAGNOSIS — E1149 Type 2 diabetes mellitus with other diabetic neurological complication: Secondary | ICD-10-CM

## 2012-07-19 DIAGNOSIS — F411 Generalized anxiety disorder: Secondary | ICD-10-CM

## 2012-07-19 DIAGNOSIS — IMO0002 Reserved for concepts with insufficient information to code with codable children: Secondary | ICD-10-CM

## 2012-07-19 DIAGNOSIS — F419 Anxiety disorder, unspecified: Secondary | ICD-10-CM

## 2012-07-19 DIAGNOSIS — M47816 Spondylosis without myelopathy or radiculopathy, lumbar region: Secondary | ICD-10-CM

## 2012-07-19 MED ORDER — HYDROMORPHONE HCL 8 MG PO TABS: 8.0000 mg | ORAL_TABLET | Freq: Two times a day (BID) | ORAL | Status: DC | PRN

## 2012-07-19 MED ORDER — MORPHINE SULFATE ER 30 MG PO TBCR: 30.0000 mg | EXTENDED_RELEASE_TABLET | Freq: Three times a day (TID) | ORAL | Status: DC

## 2012-07-19 NOTE — Progress Notes (Signed)
Subjective:    Patient ID: [name], female    DOB: 01/25/1961, 52 y.o.   MRN: [id number]  HPI The patient complains about chronic pain from waist down to her toes bilateral. The patient also complains about numbness and tingling in the same distribution. The patient complains about increased balance difficulties  The problem has gotten worse since last visit. The patient is asking me whether I could prescribe cannabis to her.  [name] is back regarding her chronic pain as it relates to her MS. She was in [facility] for over a year and was followed by a pain doctor/neurologist there. She states that she had steroid injections performed, into her back, which have helped. She was placed on tysabri as well about a month ago. She is not taking the Tysabri anymore, she has not followed up with Dr. Anne Bright, neurologist yet. She was started on MS contin and dilaudid for pain control.   Pain Inventory Average Pain 8 Pain Right Now 9 My pain is sharp, burning, stabbing, tingling and aching  In the last 24 hours, has pain interfered with the following? General activity 9 Relation with others 7 Enjoyment of life 9 What TIME of day is your pain at its worst? morning and night Sleep (in general) Fair  Pain is worse with: walking, standing and some activites Pain improves with: rest, therapy/exercise and medication Relief from Meds: 8  Mobility use a cane ability to climb steps?  yes do you drive?  yes  Function disabled: date disabled see chart  Neuro/Psych bladder control problems weakness numbness tremor tingling trouble walking spasms  Prior Studies Any changes since last visit?  no  Physicians involved in your care Any changes since last visit?  no   Family History  Problem Relation Age of Onset  . Diabetes Mother   . Hypertension Mother   . Stroke Father    History   Social History  . Marital Status: Single    Spouse Name: N/A    Number of Children: N/A  . Years of  Education: N/A   Social History Main Topics  . Smoking status: Current Every Day Smoker -- 0.25 packs/day for 12 years    Types: Cigarettes  . Smokeless tobacco: Never Used  . Alcohol Use: No  . Drug Use: No  . Sexually Active:    Other Topics Concern  . None   Social History Narrative  . None   Past Surgical History  Procedure Laterality Date  . Joint replacement      LTK  . Abdominal hysterectomy      partial  . Tubal ligation     Past Medical History  Diagnosis Date  . Multiple sclerosis exacerbation   . Hypertension   . Diabetes mellitus    BP 104/47  Pulse 70  Resp 14  Ht 5\' 3"  (1.6 m)  Wt 240 lb (108.863 kg)  BMI 42.52 kg/m2  SpO2 97%     Review of Systems  Musculoskeletal: Positive for back pain and gait problem.  Neurological: Positive for tremors, weakness and numbness.  All other systems reviewed and are negative.       Objective:   Physical Exam Constitutional: She is oriented to person, place, and time. She appears well-developed and well-nourished.  HENT:  Head: Normocephalic.  Neck: Neck supple.  Musculoskeletal: She exhibits tenderness.  Neurological: She is alert and oriented to person, place, and time.  Skin: Skin is warm and dry.  Psychiatric: She has a  normal mood and affect.  Symmetric normal motor tone is noted throughout. Normal muscle bulk. Muscle testing reveals 5/5 muscle strength of the upper extremity, and 5/5 of the lower extremity on the right , 4/5 on the left. Full range of motion in upper and lower extremities. Fine motor movements are normal in both hands.  DTR in the upper and lower extremity are present and symmetric 2+. No clonus is noted.  Patient arises from chair with mild difficulty. Wide based gait with a cane.Able to stand on heels and toes . Tandem walk is possible but unstable. No pronator drift. Rhomberg negative.  Good finger to nose and heel to shin testing. No tremor, dystaxia or dysmetria noted.          Assessment & Plan:  1. Relapsing remitting multiple sclerosis.  2. Diabetic peripheral neuropathy.  3. Morbid obesity.  4. Osteoarthritis, left knee with meniscal injury.  5. Lumbar spondylosis with facet arthropathy. MRI, Feb. 2014, showed : Stable appearing disc disease at L2-3, L3-4 and L4-5, details in report. PLAN:  1. Continue her MS contin at 30mg  q8. Will prescirbe dilaudid 8mg  q12 prn for breakthrough pain. We discussed at length the risks involved given her MS and the large doses of narcotics she was being prescribed in [place]. She is at high risk for adverse events given the possibility of sedation, increased confusion, etc. Her mother even admitted to me that she tends to be sitting around sleeping most of the day.  2. Cymbalta 30mg  qam.  3. Xanax 1mg  qday prn for anxiety  4. She also needs ongoing treatment of her MS to help with  some of these secondary symptoms.She stated at her last visit, that she will be seeing Dr. Anne Bright apparently for resumption of her neuro care in January, but she did not follow up with this. I advised her to see a neurologist again , today. She states, that she had a controlled fall, and that she has more balance problems because of her MS. I ordered PT for balance training, at first the patient refused, then she agreed to do PT.  5. The patient is asking me whether I could prescribe cannabis to her. I told her that this is illegal in this state, and I would not do this anyhow, she wants to talk to Dr. Riley Bright about this, and other medication changes.  6. Dr. Riley Bright will see her back here in a month. All questions were encouraged and answered.  We asked her to send [place] her MRI report about her L-spine, , the office in [place] sent [place] all her notes, but there was no MRI report, I ordered a new MRI of her L-spine, because of her increased symptoms, and because she wanted to have injections, they had helped in the past, but we need an MRI to administer those.  MRI  showed : Stable appearing disc disease at L2-3, L3-4 and L4-5, details in report.

## 2012-07-19 NOTE — Patient Instructions (Signed)
Follow up with your neurologist . Continue with your exercise program.

## 2012-07-27 ENCOUNTER — Telehealth: Payer: Self-pay | Admitting: Physical Medicine & Rehabilitation

## 2012-07-27 NOTE — Telephone Encounter (Signed)
ERROR

## 2012-08-19 ENCOUNTER — Encounter: Payer: Self-pay | Admitting: Physical Medicine & Rehabilitation

## 2012-08-19 ENCOUNTER — Encounter: Payer: Medicare Other | Attending: Physical Medicine and Rehabilitation | Admitting: Physical Medicine & Rehabilitation

## 2012-08-19 VITALS — BP 107/52 | HR 66 | Resp 14 | Ht 63.0 in | Wt 227.0 lb

## 2012-08-19 DIAGNOSIS — M1710 Unilateral primary osteoarthritis, unspecified knee: Secondary | ICD-10-CM | POA: Insufficient documentation

## 2012-08-19 DIAGNOSIS — M47816 Spondylosis without myelopathy or radiculopathy, lumbar region: Secondary | ICD-10-CM

## 2012-08-19 DIAGNOSIS — G35 Multiple sclerosis: Secondary | ICD-10-CM

## 2012-08-19 DIAGNOSIS — G5602 Carpal tunnel syndrome, left upper limb: Secondary | ICD-10-CM | POA: Insufficient documentation

## 2012-08-19 DIAGNOSIS — F418 Other specified anxiety disorders: Secondary | ICD-10-CM | POA: Insufficient documentation

## 2012-08-19 DIAGNOSIS — E1149 Type 2 diabetes mellitus with other diabetic neurological complication: Secondary | ICD-10-CM

## 2012-08-19 DIAGNOSIS — M47817 Spondylosis without myelopathy or radiculopathy, lumbosacral region: Secondary | ICD-10-CM

## 2012-08-19 DIAGNOSIS — F419 Anxiety disorder, unspecified: Secondary | ICD-10-CM

## 2012-08-19 DIAGNOSIS — F341 Dysthymic disorder: Secondary | ICD-10-CM

## 2012-08-19 DIAGNOSIS — E1142 Type 2 diabetes mellitus with diabetic polyneuropathy: Secondary | ICD-10-CM

## 2012-08-19 DIAGNOSIS — F411 Generalized anxiety disorder: Secondary | ICD-10-CM

## 2012-08-19 DIAGNOSIS — G5600 Carpal tunnel syndrome, unspecified upper limb: Secondary | ICD-10-CM

## 2012-08-19 DIAGNOSIS — IMO0002 Reserved for concepts with insufficient information to code with codable children: Secondary | ICD-10-CM

## 2012-08-19 MED ORDER — VENLAFAXINE HCL ER 75 MG PO CP24: 75.0000 mg | ORAL_CAPSULE | Freq: Every day | ORAL | Status: DC

## 2012-08-19 MED ORDER — MORPHINE SULFATE ER 30 MG PO TBCR: 30.0000 mg | EXTENDED_RELEASE_TABLET | Freq: Three times a day (TID) | ORAL | Status: DC

## 2012-08-19 MED ORDER — HYDROMORPHONE HCL 8 MG PO TABS: 8.0000 mg | ORAL_TABLET | Freq: Two times a day (BID) | ORAL | Status: DC | PRN

## 2012-08-19 MED ORDER — ALPRAZOLAM 1 MG PO TABS: 1.0000 mg | ORAL_TABLET | Freq: Every evening | ORAL | Status: DC | PRN

## 2012-08-19 NOTE — Patient Instructions (Signed)
WEAR YOUR WRIST SPLINT REGULARLY  WORK ON EXERCISE AND GOOD POSTURE   CUT YOUR DILAUDID IN HALF AND USE 4MG  DOSE

## 2012-08-19 NOTE — Progress Notes (Signed)
Subjective:    Patient ID: RAYLI WIEDERHOLD, female    DOB: 08/13/1960, 52 y.o.   MRN: [id number]  HPI  Evony is back regarding her pain and MS. She tells me that she has an appt with Dr. Anne Hahn next Wednesday. She hasn't necessarily experienced any further neurological progression since I last saw her.   For the last few months, she is reporting increased left wrist and hand pain. It is burning and tingling with associated numbness. She was dx'ed with CTS in [place]. She was given a wrist splint while there which she wears when it flares. Her MS tends to afffect her more on the left side as well.   Her diabetes has been under control. She has lost weight. She has done this with portion control and has walked more. She has tried to remain more active as a whole.   She has interest in cannabis for pain control.   Mood is somewhat stable. She still has problems with anxiety. She is on effexor and xanax currently.   Pain Inventory Average Pain 9 Pain Right Now 8 My pain is constant, sharp, burning, dull, stabbing, tingling and aching  In the last 24 hours, has pain interfered with the following? General activity 8 Relation with others 5 Enjoyment of life 8 What TIME of day is your pain at its worst? all the time Sleep (in general) Poor  Pain is worse with: walking, bending, standing and some activites Pain improves with: rest, medication and injections Relief from Meds: 8  Mobility use a cane ability to climb steps?  yes do you drive?  yes  Function disabled: date disabled 08  Neuro/Psych bladder control problems bowel control problems weakness numbness tingling trouble walking spasms  Prior Studies Any changes since last visit?  no  Physicians involved in your care Any changes since last visit?  no   Family History  Problem Relation Age of Onset  . Diabetes Mother   . Hypertension Mother   . Stroke Father    History   Social History  . Marital Status:  Single    Spouse Name: N/A    Number of Children: N/A  . Years of Education: N/A   Social History Main Topics  . Smoking status: Current Every Day Smoker -- 0.25 packs/day for 12 years    Types: Cigarettes  . Smokeless tobacco: Never Used  . Alcohol Use: No  . Drug Use: No  . Sexually Active:    Other Topics Concern  . None   Social History Narrative  . None   Past Surgical History  Procedure Laterality Date  . Joint replacement      LTK  . Abdominal hysterectomy      partial  . Tubal ligation     Past Medical History  Diagnosis Date  . Multiple sclerosis exacerbation   . Hypertension   . Diabetes mellitus    BP 107/52  Pulse 66  Resp 14  Ht 5\' 3"  (1.6 m)  Wt 227 lb (102.967 kg)  BMI 40.22 kg/m2  SpO2 98%     Review of Systems  Genitourinary: Positive for difficulty urinating.  Musculoskeletal: Positive for gait problem.  Neurological: Positive for weakness and numbness.  All other systems reviewed and are negative.       Objective:   Physical Exam GENERAL: The patient actually walked into the room today using her  cane.  MUSCULOSKELETAL: She walks with antalgia in the right, more in the left  side.  Gait is wide based. She has 1+ edema still in the left lower  extremity trace on the right. She is healing left knee incision which  appears intact. There is some mild scarring and swelling surrounding  the incision still. She remains hyperactive in reflexes with decreased  sensory exam to pinprick and light touch distally in all 4 limbs.  Strength is generally 4 to 5 out of 5 in all limbs (slightly weaker on left and in hand <see below>) . She remains tender throughout the low back. Was limited with lumbar ROM in all plains. Back appeared to be tender with ambulation as well as transferring from a sitting to standing position.  NEUROLOGIC: Cognitively, she is alert and appropriate. Cranial nerve  exam is intact. Tinel's test is positive at left wrist. She  has weakened grip on left 3+, decreased pp throughout the left hand. HEART: Tachycardic, but likely is related to her recent walk down the  hall to the room.  CHEST: Clear.  ABDOMEN: Soft, nontender. She has lost weight, but still is  overweight.   ASSESSMENT:  1. Relapsing remitting multiple sclerosis.  2. Diabetic peripheral neuropathy.  3. Morbid obesity.  4. Osteoarthritis, left knee with meniscal injury.  5. Lumbar spondylosis with facet arthropathy.   PLAN:  1. Continue with ms contin and dilaudid as prescribed. Would like her to start taking more of the 1/2 tab dilaudid (4mg ) for breakthrough pain. Want to decrease her to the 4mg  pill ultimately.   2. Increase effexor to 75mg  qday  3. Xanax 1mg  qday prn for anxiety --refilled today 4. MS follow up with Dr. Nila Nephew. 5. We will see her back here ina bout a month. 30 minutes of face to face patient care time were spent during this visit. All questions were encouraged and answered. Discussed holistic management of her pain including----spiritual, physical, recreational, social, vocational included.  6. Regarding her CTS, it appears to be worsening. She had NCS done in [place], but we don't have those available. Encouraged increased splint wear. i will do a NCS in about one month here to further diagnose.

## 2012-09-16 ENCOUNTER — Encounter: Payer: Medicare Other | Admitting: Physical Medicine & Rehabilitation

## 2012-10-13 ENCOUNTER — Other Ambulatory Visit: Payer: Self-pay | Admitting: Family Medicine

## 2012-10-18 ENCOUNTER — Other Ambulatory Visit: Payer: Self-pay | Admitting: Radiology

## 2012-10-18 MED ORDER — DIPHENOXYLATE-ATROPINE 2.5-0.025 MG PO TABS: 1.0000 | ORAL_TABLET | Freq: Two times a day (BID) | ORAL | Status: DC

## 2012-12-09 IMAGING — CR DG CHEST 2V
2 series · 2 of 2 positions shown · non-contrast
Comparison: None.

CLINICAL DATA: Cough and fever

CHEST - 2 VIEW

[PA]
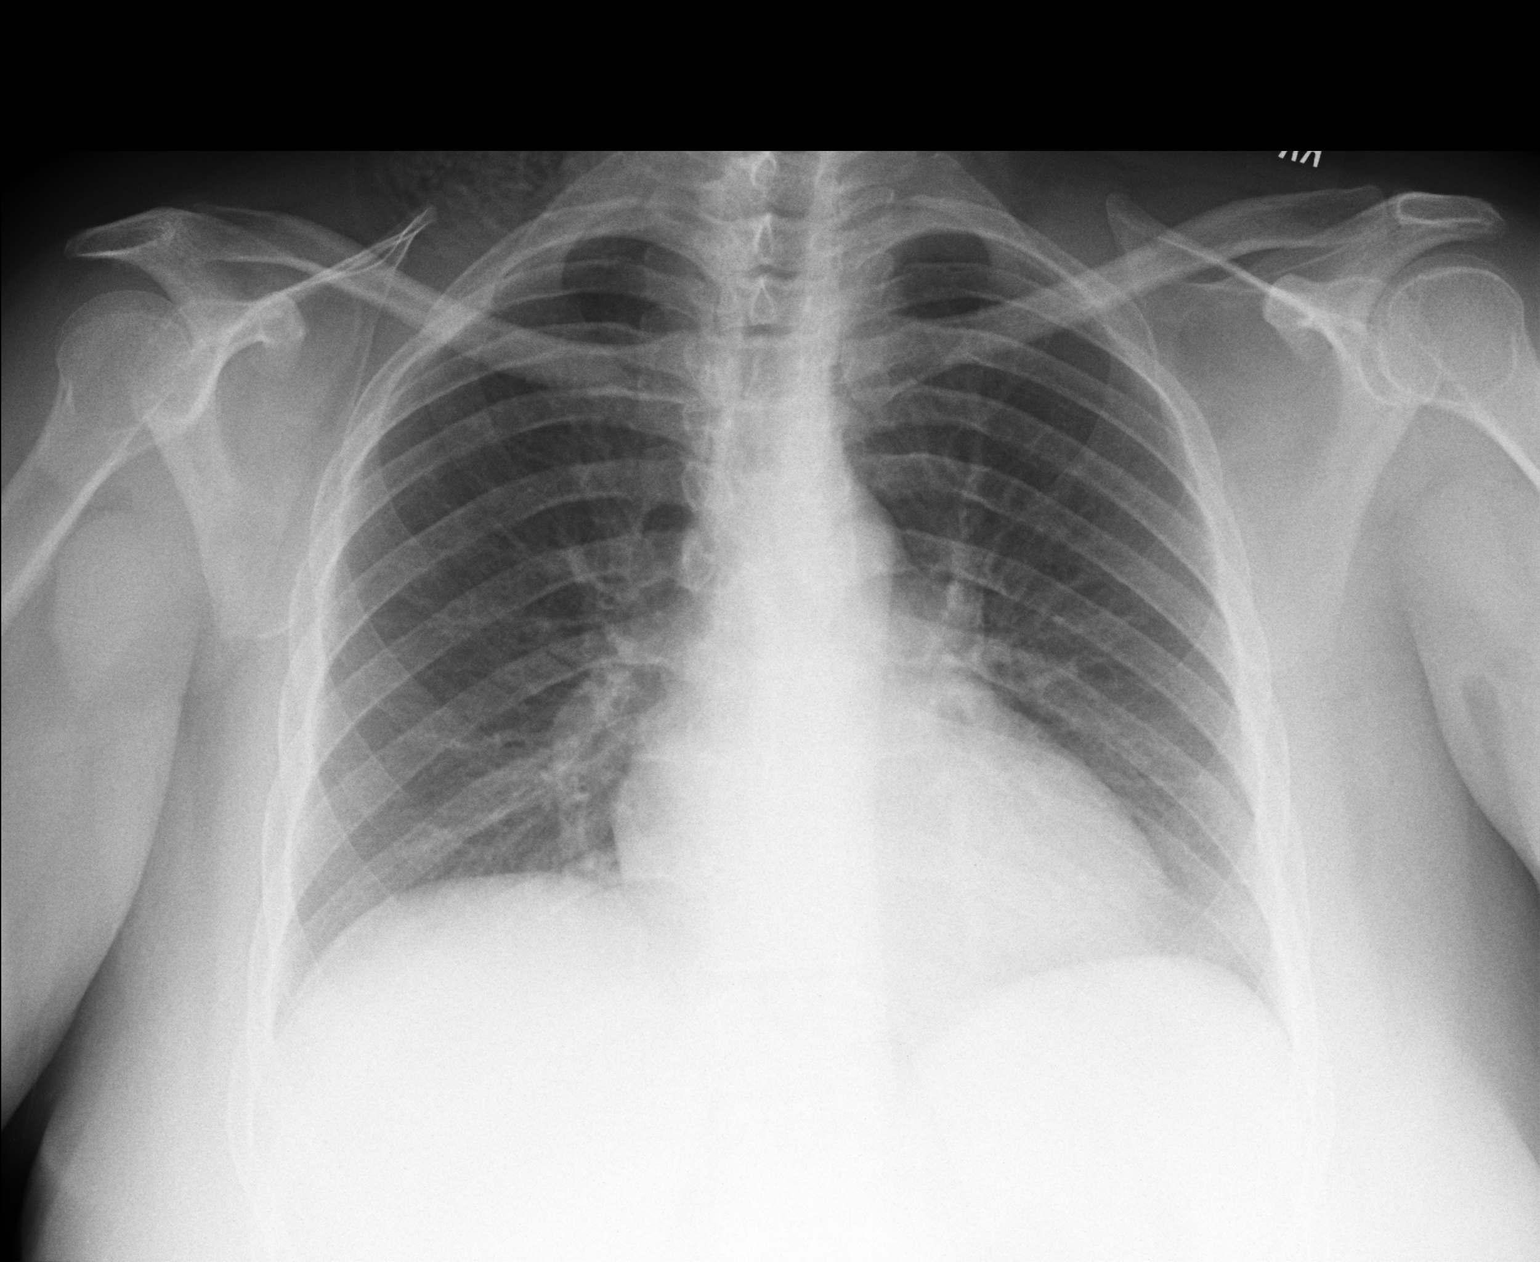

[lateral]
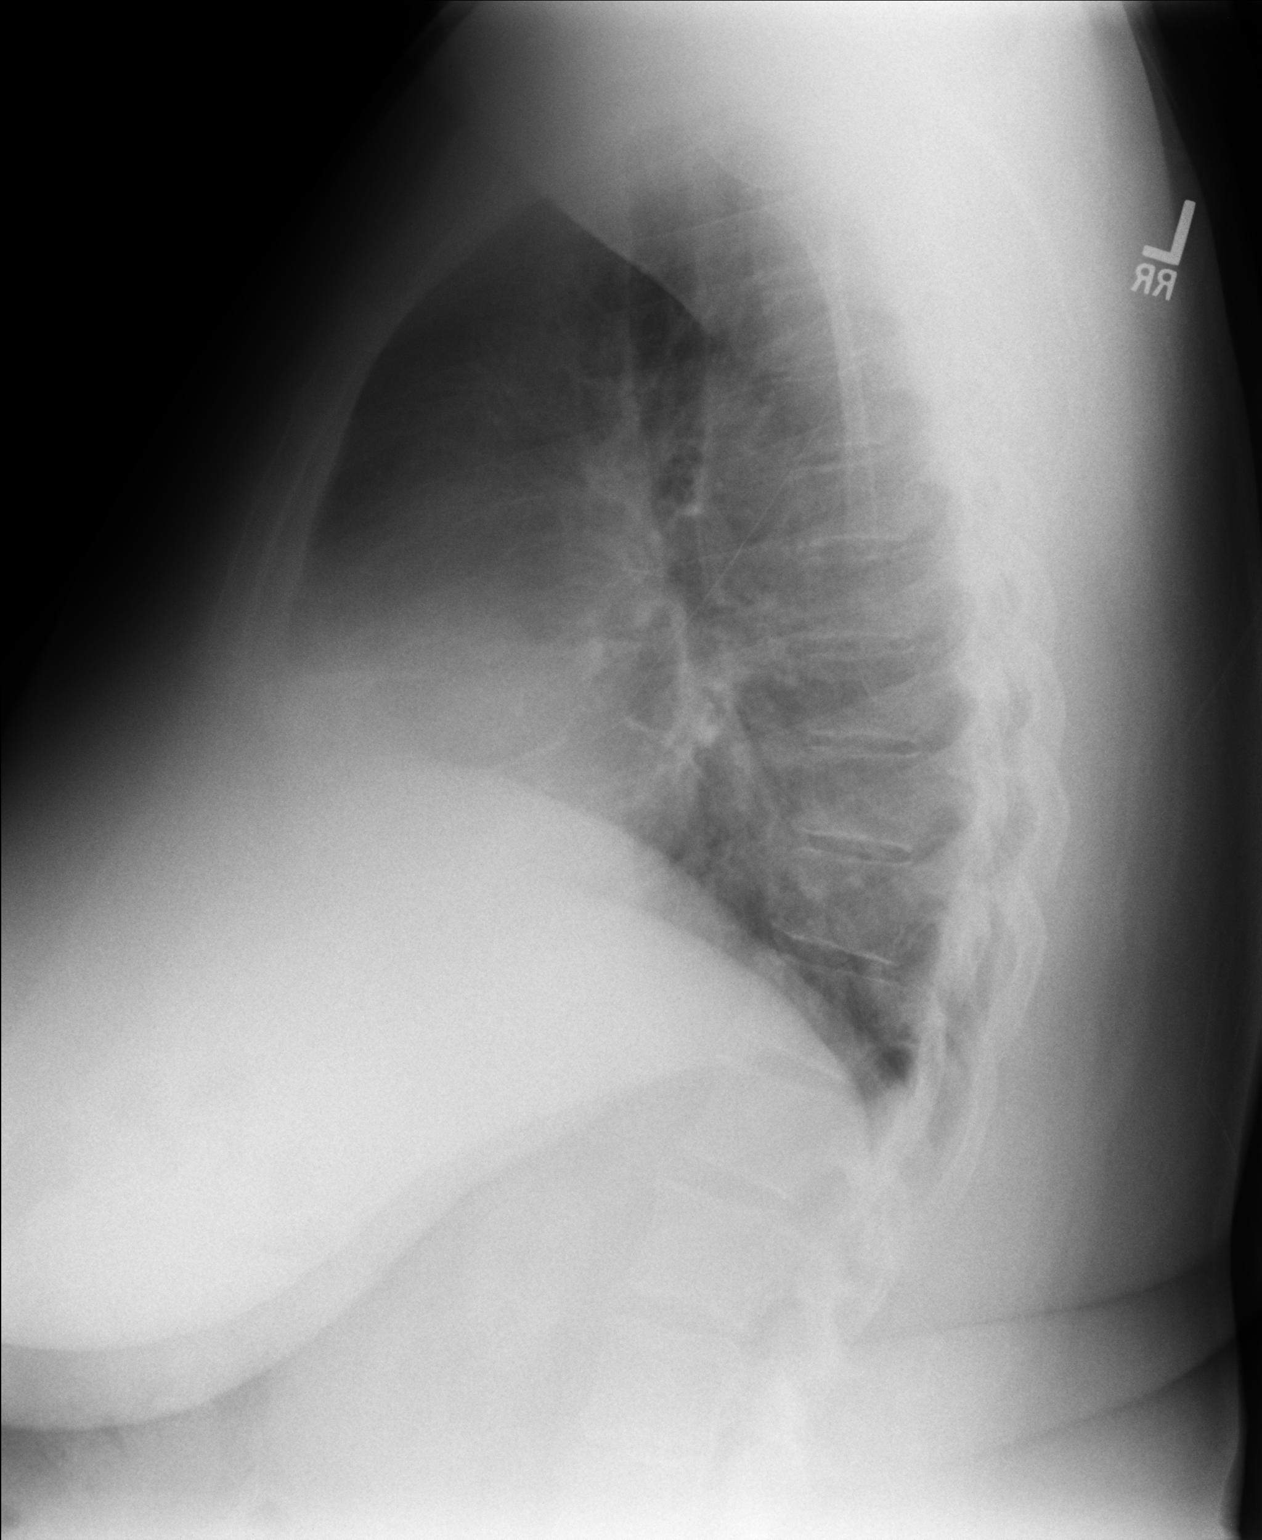

[2 of 2 positions shown; findings below may reference images not displayed]

FINDINGS: Examination degraded secondary to patient body habitus.

Normal cardiac silhouette and mediastinal contours given slightly
reduced lung volumes.  Minimal bibasilar opacities favored to
represent atelectasis.  No discrete focal airspace opacities.  No
definite pleural effusion or pneumothorax.  No acute osseous
abnormalities.
IMPRESSION: No acute cardiopulmonary disease.  Specifically, no evidence of
pneumonia.

## 2013-10-16 ENCOUNTER — Encounter: Payer: Self-pay | Admitting: Cardiology

## 2013-11-09 ENCOUNTER — Encounter: Payer: Self-pay | Admitting: Cardiology

## 2019-07-10 ENCOUNTER — Telehealth: Payer: Self-pay

## 2019-07-10 NOTE — Telephone Encounter (Signed)
Patient calling to get prescription refill. Patient is on the wait list. New patient appt 08/24/19. Patient is aware that she would need to reach out to the PCP on file for refill orders until patient is seen by Dr. Mardelle Matte.

## 2019-07-19 ENCOUNTER — Telehealth: Payer: Self-pay

## 2019-07-19 NOTE — Telephone Encounter (Signed)
error 

## 2019-07-31 DIAGNOSIS — F1120 Opioid dependence, uncomplicated: Secondary | ICD-10-CM | POA: Diagnosis not present

## 2019-07-31 DIAGNOSIS — I10 Essential (primary) hypertension: Secondary | ICD-10-CM | POA: Diagnosis not present

## 2019-07-31 DIAGNOSIS — F331 Major depressive disorder, recurrent, moderate: Secondary | ICD-10-CM | POA: Diagnosis not present

## 2019-07-31 DIAGNOSIS — B182 Chronic viral hepatitis C: Secondary | ICD-10-CM | POA: Diagnosis not present

## 2019-07-31 DIAGNOSIS — G4700 Insomnia, unspecified: Secondary | ICD-10-CM | POA: Diagnosis not present

## 2019-07-31 DIAGNOSIS — G894 Chronic pain syndrome: Secondary | ICD-10-CM | POA: Diagnosis not present

## 2019-07-31 DIAGNOSIS — G35 Multiple sclerosis: Secondary | ICD-10-CM | POA: Diagnosis not present

## 2019-07-31 DIAGNOSIS — E785 Hyperlipidemia, unspecified: Secondary | ICD-10-CM | POA: Diagnosis not present

## 2019-07-31 DIAGNOSIS — E538 Deficiency of other specified B group vitamins: Secondary | ICD-10-CM | POA: Diagnosis not present

## 2019-08-01 DIAGNOSIS — G894 Chronic pain syndrome: Secondary | ICD-10-CM | POA: Diagnosis not present

## 2019-08-01 DIAGNOSIS — G35 Multiple sclerosis: Secondary | ICD-10-CM | POA: Diagnosis not present

## 2019-08-01 DIAGNOSIS — F1120 Opioid dependence, uncomplicated: Secondary | ICD-10-CM | POA: Diagnosis not present

## 2019-08-04 DIAGNOSIS — F1120 Opioid dependence, uncomplicated: Secondary | ICD-10-CM | POA: Diagnosis not present

## 2019-08-11 ENCOUNTER — Ambulatory Visit: Payer: Medicare HMO | Attending: Internal Medicine

## 2019-08-11 DIAGNOSIS — Z23 Encounter for immunization: Secondary | ICD-10-CM

## 2019-08-11 NOTE — Progress Notes (Signed)
   Covid-19 Vaccination Clinic  Name:  [name]    MRN: [id number] DOB: 1960/10/25  08/11/2019  [name] was observed post Covid-19 immunization for 15 minutes without incident. She was provided with Vaccine Information Sheet and instruction to access the V-Safe system.   [name] was instructed to call 911 with any severe reactions post vaccine: Marland Kitchen Difficulty breathing  . Swelling of face and throat  . A fast heartbeat  . A bad rash all over body  . Dizziness and weakness   Immunizations Administered    Name Date Dose VIS Date Route   Pfizer COVID-19 Vaccine 08/11/2019 12:54 PM 0.3 mL 04/28/2019 Intramuscular   Manufacturer: [employer], [employer]   Lot: [id number]   NDC: [address]-7

## 2019-08-16 DIAGNOSIS — I700 Atherosclerosis of aorta: Secondary | ICD-10-CM | POA: Diagnosis not present

## 2019-08-16 DIAGNOSIS — I272 Pulmonary hypertension, unspecified: Secondary | ICD-10-CM | POA: Diagnosis not present

## 2019-08-16 DIAGNOSIS — G63 Polyneuropathy in diseases classified elsewhere: Secondary | ICD-10-CM | POA: Diagnosis not present

## 2019-08-16 DIAGNOSIS — G894 Chronic pain syndrome: Secondary | ICD-10-CM | POA: Diagnosis not present

## 2019-08-16 DIAGNOSIS — K589 Irritable bowel syndrome without diarrhea: Secondary | ICD-10-CM | POA: Diagnosis not present

## 2019-08-16 DIAGNOSIS — F1120 Opioid dependence, uncomplicated: Secondary | ICD-10-CM | POA: Diagnosis not present

## 2019-08-16 DIAGNOSIS — D8481 Immunodeficiency due to conditions classified elsewhere: Secondary | ICD-10-CM | POA: Diagnosis not present

## 2019-08-24 ENCOUNTER — Ambulatory Visit: Payer: Self-pay | Admitting: Family Medicine

## 2019-09-05 ENCOUNTER — Ambulatory Visit: Payer: Medicare HMO | Attending: Internal Medicine

## 2019-09-05 DIAGNOSIS — Z23 Encounter for immunization: Secondary | ICD-10-CM

## 2019-09-05 NOTE — Progress Notes (Signed)
   Covid-19 Vaccination Clinic  Name:  [name]    MRN: [id number] DOB: 08/29/1960  09/05/2019  [name] was observed post Covid-19 immunization for 15 minutes without incident. She was provided with Vaccine Information Sheet and instruction to access the V-Safe system.   [name] was instructed to call 911 with any severe reactions post vaccine: Marland Kitchen Difficulty breathing  . Swelling of face and throat  . A fast heartbeat  . A bad rash all over body  . Dizziness and weakness   Immunizations Administered    Name Date Dose VIS Date Route   Pfizer COVID-19 Vaccine 09/05/2019  9:20 AM 0.3 mL 07/12/2018 Intramuscular   Manufacturer: [employer], [employer]   Lot: [id number]   NDC: [address]-4

## 2019-11-17 ENCOUNTER — Emergency Department (HOSPITAL_COMMUNITY): Payer: Medicare Other

## 2019-11-17 ENCOUNTER — Emergency Department (HOSPITAL_COMMUNITY)
Admission: EM | Admit: 2019-11-17 | Discharge: 2019-11-18 | Disposition: A | Discharge status: Home / Self Care | Payer: Medicare Other | Attending: Emergency Medicine | Admitting: Emergency Medicine

## 2019-11-17 ENCOUNTER — Encounter (HOSPITAL_COMMUNITY): Payer: Self-pay

## 2019-11-17 DIAGNOSIS — F17210 Nicotine dependence, cigarettes, uncomplicated: Secondary | ICD-10-CM | POA: Diagnosis not present

## 2019-11-17 DIAGNOSIS — Z96652 Presence of left artificial knee joint: Secondary | ICD-10-CM | POA: Diagnosis not present

## 2019-11-17 DIAGNOSIS — I10 Essential (primary) hypertension: Secondary | ICD-10-CM | POA: Insufficient documentation

## 2019-11-17 DIAGNOSIS — M542 Cervicalgia: Secondary | ICD-10-CM | POA: Diagnosis present

## 2019-11-17 DIAGNOSIS — Z79899 Other long term (current) drug therapy: Secondary | ICD-10-CM | POA: Insufficient documentation

## 2019-11-17 DIAGNOSIS — G35 Multiple sclerosis: Secondary | ICD-10-CM | POA: Insufficient documentation

## 2019-11-17 DIAGNOSIS — E1140 Type 2 diabetes mellitus with diabetic neuropathy, unspecified: Secondary | ICD-10-CM | POA: Diagnosis not present

## 2019-11-17 DIAGNOSIS — R5383 Other fatigue: Secondary | ICD-10-CM | POA: Insufficient documentation

## 2019-11-17 LAB — BASIC METABOLIC PANEL
Anion gap: 11 (ref 5–15)
BUN: 11 mg/dL (ref 6–20)
CO2: 24 mmol/L (ref 22–32)
Calcium: 9.5 mg/dL (ref 8.9–10.3)
Chloride: 107 mmol/L (ref 98–111)
Creatinine, Ser: 0.92 mg/dL (ref 0.44–1.00)
GFR calc Af Amer: >60 mL/min (ref >60)
GFR calc non Af Amer: >60 mL/min (ref >60)
Glucose, Bld: 81 mg/dL (ref 70–99)
Potassium: 3.3 mmol/L — ABNORMAL LOW (ref 3.5–5.1)
Sodium: 142 mmol/L (ref 135–145)

## 2019-11-17 LAB — URINALYSIS, ROUTINE W REFLEX MICROSCOPIC
Bilirubin Urine: NEGATIVE
Glucose, UA: NEGATIVE mg/dL
Hgb urine dipstick: NEGATIVE
Ketones, ur: NEGATIVE mg/dL
Leukocytes,Ua: NEGATIVE
Nitrite: NEGATIVE
Protein, ur: NEGATIVE mg/dL
Specific Gravity, Urine: 1.006 (ref 1.005–1.030)
pH: 7 (ref 5.0–8.0)

## 2019-11-17 LAB — CBC
HCT: 42 % (ref 36.0–46.0)
Hemoglobin: 13.9 g/dL (ref 12.0–15.0)
MCH: 29.8 pg (ref 26.0–34.0)
MCHC: 33.1 g/dL (ref 30.0–36.0)
MCV: 89.9 fL (ref 80.0–100.0)
Platelets: 272 10*3/uL (ref 150–400)
RBC: 4.67 MIL/uL (ref 3.87–5.11)
RDW: 12.6 % (ref 11.5–15.5)
WBC: 14.3 10*3/uL — ABNORMAL HIGH (ref 4.0–10.5)
nRBC: 0 % (ref 0.0–0.2)

## 2019-11-17 MED ORDER — MIDAZOLAM HCL 2 MG/2ML IJ SOLN: 4.0000 mg | Freq: Once | INTRAMUSCULAR | Status: DC | PRN

## 2019-11-17 MED ORDER — MIDAZOLAM HCL 2 MG/2ML IJ SOLN
3.0000 mg | Freq: Once | INTRAMUSCULAR | Status: AC
  Administered 2019-11-17: 3 mg via INTRAVENOUS

## 2019-11-17 MED ORDER — LORAZEPAM 2 MG/ML IJ SOLN
1.0000 mg | Freq: Once | INTRAMUSCULAR | Status: DC
  Filled 2019-11-17: qty 1

## 2019-11-17 MED ORDER — MORPHINE SULFATE (PF) 4 MG/ML IV SOLN
4.0000 mg | Freq: Once | INTRAVENOUS | Status: AC
  Administered 2019-11-17: 4 mg via INTRAVENOUS
  Filled 2019-11-17: qty 1

## 2019-11-17 MED ORDER — MIDAZOLAM HCL 2 MG/2ML IJ SOLN
4.0000 mg | Freq: Once | INTRAMUSCULAR | Status: DC
  Filled 2019-11-17: qty 4

## 2019-11-17 NOTE — ED Notes (Signed)
Pt alert and oriented, vital signs stable. RN spoke to Dr. Renaye Rakers in regards to versed dose. Per verbal order, dosage decreased to 3mg . RN called MRI to inform them that pt received the medication. Pt transported to CT

## 2019-11-17 NOTE — ED Notes (Signed)
attempted IV start x3. MD notified.

## 2019-11-17 NOTE — ED Notes (Signed)
MRI called this RN to notify that pt will be returning from MRI because pt stimulator is not charged. Per report this RN received from off going RN, pt was informed that remote needed to be charged. MRI tech stated he informed PA.

## 2019-11-17 NOTE — ED Provider Notes (Signed)
Care assumed from H. Khatri PA-C at shift change pending MRIs brain and cervical spine without contrast.  See her note for full H&P.   Case discussed with neurology by previous provider with plan to call back with results.   Plan to discuss results with neurology.   Currently waiting on patient's daughter to bring the remote. The remote is charged.  Physical Exam  BP (!) 155/81   Pulse 88   Temp 97.9 F (36.6 C) (Oral)   Resp 20   SpO2 100%   Physical Exam  PE: Constitutional: well-developed, well-nourished, no apparent distress HENT: normocephalic, atraumatic. no cervical adenopathy.  No meningeal signs Cardiovascular: normal rate and rhythm, distal pulses intact Pulmonary/Chest: effort normal; breath sounds clear and equal bilaterally; no wheezes or rales Abdominal: soft and nontender Musculoskeletal: full ROM, no edema Neurological: alert with goal directed thinking Skin: warm and dry, no rash, no diaphoresis Psychiatric: normal mood and affect, normal behavior    ED Course/Procedures   Clinical Course as of Nov 17 24  Sat Nov 18, 2019  0022 59 yo female presenting with neck pain, shoulder pain, paresthesias, and headache.  Patient reporting this feels exactly like her prior MS flare ups.  Her neurologist had recommended hospital admission for IV steroids.  On exam she has no photophobia, no nuccal rigidity, no neurological deficits.  She does report a history of meningitis, but with no fever or confusion or toxicty after multiple days of symptoms, I am highly doubtful of meningitis at this time.  She has a leukocytosis of 14.3 but reports her PCP gave her solumedrol 2 days ago.  Here in the ED we've had an extraordinarily difficult time obtaining an MRI here due to a bladder stimulator implant, which was ultimately felt to be incompatible with MRI.  We are now pending neurology consult and admission.   [MT]    Clinical Course User Index [MT] Trifan, Kermit Balo, MD     Procedures  MDM  Patient received in signout pending MRIs.  Please see previous provider note to include MDM up to this point.  On my exam patient is very well-appearing.  She has no focal weakness, neuro exam is normal.  She ambulates with steady gait.  She has no meningeal signs, no nuchal rigidity.  She is afebrile with normal vitals.  Patient also evaluated by ED attending Dr. Renaye Rakers. At the end of my shift patient is still pending MRIs.  She was transported to MRI however technician calls to tell me that they are unable to scan the patient as the stimulator itself has a dead battery. Neurology consult placed.  Patient care transferred to R. Browning PA-C at the end of my shift pending neurology consult. Patient presentation, ED course, and plan of care discussed with review of all pertinent labs and imaging. Please see his note for further details regarding further ED course and disposition.    Portions of this note were generated with Scientist, clinical (histocompatibility and immunogenetics). Dictation errors may occur despite best attempts at proofreading.    Sherene Sires, PA-C 11/18/19 0029    Terald Sleeper, MD 11/23/19 [phone]

## 2019-11-17 NOTE — ED Provider Notes (Signed)
[facility] EMERGENCY DEPARTMENT Provider Note   CSN: [id number] Arrival date & time: 11/17/19  1211     History Chief Complaint  Patient presents with  . Neck Pain    [name] is a 59 y.o. female with a past medical history of MS, hypertension presenting to the ED with a chief complaint of possible MS flareup.  States that for the past 5 days and worsening 3 days ago started having stiffness in the right side of her neck and shoulder, headache.  Reports blurry vision as well.  States that this feels similar to her prior MS flareups.  She is concerned that her flareups are usually caused by stress and "overdoing it" which she admits to recently.  States that she had a similar flareup about 2 years ago which was treated with steroids.  She called her neurologist today and was told to come to the ER.  She reports compliance with her home medications.  Denies any chest pain, injuries or falls, abdominal pain, vomiting, diarrhea.  HPI     Past Medical History:  Diagnosis Date  . Diabetes mellitus   . Hypertension   . Multiple sclerosis exacerbation [facility])     Patient Active Problem List   Diagnosis Date Noted  . Left carpal tunnel syndrome 08/19/2012  . Depression with anxiety 08/19/2012  . Lumbar spondylosis 03/07/2012  . Osteoarthrosis, unspecified whether generalized or localized, lower leg 03/07/2012  . Diabetic peripheral neuropathy ([place]) 03/07/2012  . SMOKER 06/02/2010  . SIMPLE CHRONIC BRONCHITIS 06/02/2010  . DYSPNEA 06/02/2010  . SKIN RASH 08/05/2009  . ACUTE CYSTITIS 05/03/2009  . DIABETES MELLITUS 02/18/2009  . INSOMNIA 02/12/2009  . MORBID OBESITY 11/28/2008  . LIVER FUNCTION TESTS, ABNORMAL, HX OF 11/28/2008  . DEGENERATION INTERVERTEBRAL DISC SITE UNSPEC 07/02/2008  . Other chronic pain 05/22/2008  . BLURRED VISION 05/22/2008  . ESOPHAGEAL REFLUX 01/10/2008  . Multiple sclerosis ([place]) 12/09/2007  . URINARY RETENTION 12/09/2007  .  TACHYCARDIA 11/17/2007  . HYPERTENSION 11/08/2007    Past Surgical History:  Procedure Laterality Date  . ABDOMINAL HYSTERECTOMY     partial  . JOINT REPLACEMENT     LTK  . TUBAL LIGATION       OB History   No obstetric history on file.     Family History  Problem Relation Age of Onset  . Diabetes Mother   . Hypertension Mother   . Stroke Father     Social History   Tobacco Use  . Smoking status: Current Every Day Smoker    Packs/day: 0.25    Years: 12.00    Pack years: 3.00    Types: Cigarettes  . Smokeless tobacco: Never Used  Substance Use Topics  . Alcohol use: No  . Drug use: No    Home Medications Prior to Admission medications   Medication Sig Start Date End Date Taking? Authorizing Provider  acetaminophen (PAIN RELIEF) 325 MG tablet Take 650 mg by mouth 3 (three) times daily as needed for moderate pain.   Yes [provider]  amitriptyline (ELAVIL) 25 MG tablet Take 25 mg by mouth at bedtime. 10/09/19  Yes [provider]  ANTI-DIARRHEAL 2 MG tablet Take 2 mg by mouth as needed for diarrhea or loose stools.  07/31/19  Yes [provider]  atorvastatin (LIPITOR) 80 MG tablet Take 1 tablet (80 mg total) by mouth daily. Patient taking differently: Take 80 mg by mouth every evening.  02/24/12  Yes Copland, Gay Filler,  MD  baclofen (LIORESAL) 20 MG tablet Take 20 mg by mouth 2 (two) times daily.  08/28/19  Yes [provider]  busPIRone (BUSPAR) 15 MG tablet Take 15 mg by mouth 3 (three) times daily. 10/28/19  Yes [provider]  clotrimazole (MYCELEX) 10 MG troche Take 10 mg by mouth 3 (three) times daily as needed (thrush/vaginal yeast infections).  07/03/19  Yes [provider]  dicyclomine (BENTYL) 20 MG tablet Take 40 mg by mouth in the morning and at bedtime.  10/23/19  Yes [provider]  diphenoxylate-atropine (LOMOTIL) 2.5-0.025 MG per tablet Take 1 tablet by mouth 2 (two) times daily. PATIENT NEEDS  OFFICE VISIT FOR ADDITIONAL REFILLS Patient taking differently: Take 1 tablet by mouth in the morning, at noon, and at bedtime. PATIENT NEEDS OFFICE VISIT FOR ADDITIONAL REFILLS 10/18/12  Yes Copland, Gay Filler, MD  DULoxetine (CYMBALTA) 60 MG capsule Take 120 mg by mouth daily. 10/04/19  Yes [provider]  Echinacea 400 MG CAPS Take 400 mg by mouth in the morning and at bedtime.   Yes [provider]  fluticasone (FLONASE) 50 MCG/ACT nasal spray Place 2 sprays into the nose daily. Patient taking differently: Place 1 spray into the nose in the morning and at bedtime.  02/24/12  Yes Copland, Gay Filler, MD  ibuprofen (ADVIL) 800 MG tablet Take 800 mg by mouth 3 (three) times daily as needed for moderate pain.  09/25/19  Yes [provider]  losartan (COZAAR) 100 MG tablet Take 1 tablet (100 mg total) by mouth daily. Patient taking differently: Take 100 mg by mouth every evening.  02/24/12  Yes Copland, Gay Filler, MD  MAGNESIUM-OXIDE 400 (241.3 Mg) MG tablet Take 1 tablet by mouth daily. 10/18/19  Yes [provider]  MELATONIN MAXIMUM STRENGTH 5 MG TABS Take 15 mg by mouth at bedtime. 07/31/19  Yes [provider]  montelukast (SINGULAIR) 10 MG tablet Take 10 mg by mouth daily. 07/31/19  Yes [provider]  MUCUS RELIEF 400 MG TABS tablet Take 400 mg by mouth 2 (two) times daily. 07/31/19  Yes [provider]  Multiple Vitamins-Calcium (ONE-A-DAY WOMENS FORMULA) TABS Take 1 tablet by mouth daily. 07/31/19  Yes [provider]  Multiple Vitamins-Minerals (AIRBORNE) CHEW Chew 3 tablets by mouth daily.  07/31/19  Yes [provider]  NARCAN 4 MG/0.1ML LIQD nasal spray kit Place 1 spray into the nose as directed. overdose 10/11/19  Yes [provider]  NIFEdipine (PROCARDIA-XL/ADALAT-CC/NIFEDICAL-XL) 30 MG 24 hr tablet Take 30 mg by mouth daily. 02/24/12  Yes Copland, Gay Filler, MD  NUCYNTA ER 200 MG TB12 Take 1 tablet by mouth  2 (two) times daily. 11/10/19  Yes [provider]  Oxycodone HCl 10 MG TABS Take 10 mg by mouth every 6 (six) hours. 11/10/19  Yes [provider]  pantoprazole (PROTONIX) 40 MG tablet Take 40 mg by mouth 2 (two) times daily. 10/09/19  Yes [provider]  TECFIDERA 240 MG CPDR Take 1 capsule by mouth 2 (two) times daily. 10/31/19  Yes [provider]  tiZANidine (ZANAFLEX) 2 MG tablet Take 2 mg by mouth 2 (two) times daily. 10/23/19  Yes [provider]  topiramate (TOPAMAX) 100 MG tablet Take 1 tablet (100 mg total) by mouth daily. 02/24/12  Yes Copland, Gay Filler, MD  VIBERZI 100 MG TABS Take 1 tablet by mouth 2 (two) times daily. 11/07/19  Yes [provider]  vitamin B-12 (CYANOCOBALAMIN) 1000 MCG tablet  Take 1,000 mcg by mouth daily.  07/31/19  Yes [provider]  XIIDRA 5 % SOLN Place 1 drop into both eyes in the morning and at bedtime.  11/10/19  Yes [provider]  zolpidem (AMBIEN) 10 MG tablet Take 10 mg by mouth at bedtime.  10/28/19  Yes [provider]  albuterol (PROVENTIL HFA;VENTOLIN HFA) 108 (90 BASE) MCG/ACT inhaler Inhale 2 puffs into the lungs every 6 (six) hours as needed for wheezing. Patient not taking: Reported on 11/17/2019 04/18/12   Copland, Gay Filler, MD  ALPRAZolam Duanne Moron) 1 MG tablet Take 1 tablet (1 mg total) by mouth at bedtime as needed. For anxiety. Patient not taking: Reported on 11/17/2019 08/19/12   Meredith Staggers, MD  BELBUCA 150 MCG FILM SMARTSIG:1 Strip(s) By Mouth Every 12 Hours Patient not taking: Reported on 11/17/2019 07/31/19   [provider]  benzonatate (TESSALON) 100 MG capsule Take 1 capsule (100 mg total) by mouth 3 (three) times daily as needed for cough. Patient not taking: Reported on 11/17/2019 04/18/12   Copland, Gay Filler, MD  cloNIDine (CATAPRES) 0.2 MG tablet Take 1 tablet (0.2 mg total) by mouth 2 (two) times daily. Patient not taking: Reported on 11/17/2019 02/24/12    Copland, Gay Filler, MD  gabapentin (NEURONTIN) 800 MG tablet Take 1 tablet (800 mg total) by mouth 3 (three) times daily. Patient not taking: Reported on 11/17/2019 02/24/12   Copland, Gay Filler, MD  hydrALAZINE (APRESOLINE) 100 MG tablet Take 100 mg by mouth 3 (three) times daily. Patient not taking: Reported on 11/17/2019 02/24/12   Copland, Gay Filler, MD  HYDROmorphone (DILAUDID) 8 MG tablet Take 1 tablet (8 mg total) by mouth every 12 (twelve) hours as needed for pain. Patient not taking: Reported on 11/17/2019 08/19/12   Meredith Staggers, MD  LORazepam (ATIVAN) 2 MG tablet Take 1 tablet (2 mg total) by mouth at bedtime as needed. For sleep. Patient not taking: Reported on 11/17/2019 02/24/12   Copland, Gay Filler, MD  metoprolol (LOPRESSOR) 50 MG tablet Take 1 tablet (50 mg total) by mouth 2 (two) times daily. Patient not taking: Reported on 11/17/2019 02/24/12   Copland, Gay Filler, MD  morphine (MS CONTIN) 30 MG 12 hr tablet Take 1 tablet (30 mg total) by mouth 3 (three) times daily. Patient not taking: Reported on 11/17/2019 08/19/12   Meredith Staggers, MD  neomycin-polymyxin b-dexamethasone (MAXITROL) 3.5-10000-0.1 OINT SMARTSIG:1 Inch(es) In Eye(s) Every Night Patient not taking: Reported on 11/17/2019 06/27/19   [provider]  NIFEdipine (ADALAT CC) 30 MG 24 hr tablet Take 30 mg by mouth daily. Patient not taking: Reported on 11/17/2019 10/02/19   [provider]  omeprazole (PRILOSEC) 20 MG capsule Take 1 capsule (20 mg total) by mouth daily. Patient not taking: Reported on 11/17/2019 02/24/12   Copland, Gay Filler, MD  oxyCODONE (ROXICODONE) 15 MG immediate release tablet Take 15 mg by mouth every 6 (six) hours. Patient not taking: Reported on 11/17/2019 09/11/19   [provider]  prednisoLONE acetate (PRED FORTE) 1 % ophthalmic suspension  06/20/19   [provider]  predniSONE (STERAPRED UNI-PAK 21 TAB) 10 MG (21) TBPK tablet Take by mouth. Patient not taking: Reported on  11/17/2019 11/16/19   [provider]  RESTASIS 0.05 % ophthalmic emulsion  10/02/19   [provider]  sucralfate (CARAFATE) 1 G tablet Take 1 tablet (1 g total) by mouth 2 (two) times daily. Patient not taking: Reported on 11/17/2019 02/24/12  Copland, Gay Filler, MD  Tamsulosin HCl (FLOMAX) 0.4 MG CAPS Take 1 capsule (0.4 mg total) by mouth daily. Patient not taking: Reported on 11/17/2019 02/24/12   Copland, Gay Filler, MD  tiZANidine (ZANAFLEX) 4 MG tablet Take 1 tablet (4 mg total) by mouth every 8 (eight) hours as needed. For muscle spasm. Patient not taking: Reported on 11/17/2019 02/24/12   Copland, Gay Filler, MD  venlafaxine XR (EFFEXOR XR) 75 MG 24 hr capsule Take 1 capsule (75 mg total) by mouth daily. Patient not taking: Reported on 11/17/2019 08/19/12   Meredith Staggers, MD    Allergies    Patient has no known allergies.  Review of Systems   Review of Systems  Constitutional: Positive for fatigue. Negative for appetite change, chills and fever.  HENT: Negative for ear pain, rhinorrhea, sneezing and sore throat.   Eyes: Negative for photophobia and visual disturbance.  Respiratory: Negative for cough, chest tightness, shortness of breath and wheezing.   Cardiovascular: Negative for chest pain and palpitations.  Gastrointestinal: Negative for abdominal pain, blood in stool, constipation, diarrhea, nausea and vomiting.  Genitourinary: Negative for dysuria, hematuria and urgency.  Musculoskeletal: Positive for myalgias and neck pain.  Skin: Negative for rash.  Neurological: Positive for headaches. Negative for dizziness, weakness and light-headedness.    Physical Exam Updated Vital Signs BP (!) 155/81   Pulse 88   Temp 97.9 F (36.6 C) (Oral)   Resp 20   SpO2 100%   Physical Exam Vitals and nursing note reviewed.  Constitutional:      General: She is not in acute distress.    Appearance: She is well-developed.  HENT:     Head: Normocephalic and atraumatic.      Nose: Nose normal.  Eyes:     General: No scleral icterus.       Right eye: No discharge.        Left eye: No discharge.     Conjunctiva/sclera: Conjunctivae normal.     Pupils: Pupils are equal, round, and reactive to light.  Cardiovascular:     Rate and Rhythm: Normal rate and regular rhythm.     Heart sounds: Normal heart sounds. No murmur heard.  No friction rub. No gallop.   Pulmonary:     Effort: Pulmonary effort is normal. No respiratory distress.     Breath sounds: Normal breath sounds.  Abdominal:     General: Bowel sounds are normal. There is no distension.     Palpations: Abdomen is soft.     Tenderness: There is no abdominal tenderness. There is no guarding.  Musculoskeletal:        General: Normal range of motion.     Cervical back: Normal range of motion and neck supple.  Skin:    General: Skin is warm and dry.     Findings: No rash.  Neurological:     General: No focal deficit present.     Mental Status: She is alert and oriented to person, place, and time.     Cranial Nerves: No cranial nerve deficit.     Sensory: No sensory deficit.     Motor: No abnormal muscle tone.     Coordination: Coordination normal.     ED Results / Procedures / Treatments   Labs (all labs ordered are listed, but only abnormal results are displayed) Labs Reviewed  CBC - Abnormal; Notable for the following components:      Result Value   WBC 14.3 (*)  All other components within normal limits  BASIC METABOLIC PANEL - Abnormal; Notable for the following components:   Potassium 3.3 (*)    All other components within normal limits  URINALYSIS, ROUTINE W REFLEX MICROSCOPIC - Abnormal; Notable for the following components:   Color, Urine STRAW (*)    All other components within normal limits  URINE CULTURE    EKG None  Radiology No results found.  Procedures Procedures (including critical care time)  Medications Ordered in ED Medications  morphine 4 MG/ML injection 4  mg (4 mg Intravenous Given 11/17/19 1945)    ED Course  I have reviewed the triage vital signs and the nursing notes.  Pertinent labs & imaging results that were available during my care of the patient were reviewed by me and considered in my medical decision making (see chart for details).    MDM Rules/Calculators/A&P                          59 year old female with a past medical history of MS, hypertension presenting to the ED with a chief complaint of possible MS flareup.  Reports 5-day history of stiffness to the right side of the neck and shoulder as well as a headache and blurry vision.  States that she is "seeing spots."  Symptoms worsen 3 days ago.  She reports that symptoms feel similar to prior MS flareups including last year.  She has been taking her home pain medications with only minimal improvement.  Denies any chest pain, fever, confusion, injuries or falls, numbness or weakness.  On exam patient is moving neck without difficulty.  She appears overall comfortable.  She has normal strength and sensation of her upper and lower extremities.  No facial asymmetry noted.  Lab work here showing leukocytosis of 14.  BMP unremarkable.  Urinalysis is unremarkable.  Will need to obtain MRIs of the brain and cervical spine to rule out acute MS flare.  Per Dr. Rory Percy, neurologist recommendations will need to obtain imaging prior to treatment. Of note, patient has a bladder stimulator in place.  Unfortunately she does not have the remote here.  I spoke to the MRI tech who states that as long as the patient has a charged remote here in the stimulator can be placed in safe mode, they will be able to obtain the MRI.  She is currently waiting for her daughter to bring her her remote. Care handed off to oncoming team pending remainder of workup.   Portions of this note were generated with Lobbyist. Dictation errors may occur despite best attempts at proofreading.   Final Clinical  Impression(s) / ED Diagnoses Final diagnoses:  Multiple sclerosis [facility])    Rx / DC Orders ED Discharge Orders    None       Delia Heady, PA-C 11/17/19 2050    Wyvonnia Dusky, MD 11/23/19 1202

## 2019-11-17 NOTE — ED Notes (Signed)
Snack provided

## 2019-11-17 NOTE — Progress Notes (Signed)
Attempted MRI, pt has not charged the device in a month and does not have the charger at this time to charge it. I am unable to confirm stimulation off for MRI therefore cannot proceed with this MRI scan.

## 2019-11-17 NOTE — ED Triage Notes (Signed)
Pt arrives to ED w/ c/o MS relapse. Pt reports stiff neck, anxiety, and vision changes.

## 2019-11-18 ENCOUNTER — Emergency Department (HOSPITAL_COMMUNITY): Payer: Medicare Other

## 2019-11-18 MED ORDER — MIDAZOLAM HCL 2 MG/2ML IJ SOLN
4.0000 mg | Freq: Once | INTRAMUSCULAR | Status: AC
  Administered 2019-11-18: 4 mg via INTRAVENOUS
  Filled 2019-11-18: qty 4

## 2019-11-18 MED ORDER — GADOBUTROL 1 MMOL/ML IV SOLN
10.0000 mL | Freq: Once | INTRAVENOUS | Status: AC | PRN
  Administered 2019-11-18: 10 mL via INTRAVENOUS

## 2019-11-18 MED ORDER — SODIUM CHLORIDE 0.9 % IV SOLN
1000.0000 mg | Freq: Once | INTRAVENOUS | Status: AC
  Administered 2019-11-18: 1000 mg via INTRAVENOUS
  Filled 2019-11-18: qty 8

## 2019-11-18 NOTE — ED Notes (Signed)
Patient verbalizes understanding of discharge instructions . Opportunity for questions and answers were provided . Armband removed by staff ,Pt discharged from ED. W/C  offered at D/C  and Declined W/C at D/C and was escorted to lobby by RN.  

## 2019-11-18 NOTE — Discharge Instructions (Addendum)
There was no evidence of acute MS flare on the MRI.  You were given a dose of steroid per our neurologist's recommendations, but with a reassuring MRI, our neurologist recommends outpatient follow-up.

## 2019-11-18 NOTE — ED Notes (Signed)
Pt has called Daughter to pick her up and bring cloths. Pt informed if a Pt comes by ambulance she will be moved to a hall bed.

## 2019-11-18 NOTE — ED Provider Notes (Signed)
Clinical Course as of Nov 18 1315  Sat Nov 18, 2019  0022 59 yo female w/ hx of MS presenting with neck pain, shoulder pain, paresthesias, and headache.  Patient reporting this feels exactly like her prior MS flare ups.  She has had symptoms for several days.  Her neurologist had recommended hospital admission for IV steroids.  On exam she has no photophobia, no nuccal rigidity, no neurological deficits.  Her symptoms are highly consistent with an MS given the involvement of multiple parts of her CNS system (eg blurred vision, paresthesias in feet, tingling in hands).  Less likely a stroke with this presentation. She does report a history of meningitis many years ago, but with no fever or confusion or toxicity after multiple days of symptoms, I am highly doubtful of bacterial meningitis at this time.  She has a leukocytosis of 14.3, but reports her PCP gave her solumedrol 2 days ago for MS.   Here in the ED we've had an extraordinarily difficult time obtaining an MRI here due to her bladder stimulator implant, which was initially felt to be incompatible with MRI.  We discussed with Dr Laurence Slate of neurology who contacted back the MRI technicians and informed us that we CAN proceed with the scan.  At the time of my signout to the overnight EDP team, we were pending a 2nd attempt at MRI imaging. I anticipate the patient will likely require admission for IV steroids and formal neurology consultation.   [MT]    Clinical Course User Index [MT] Terald Sleeper, MD   At the time of my signout to the overnight EDP team at 1220 AM, the patient remained afebrile, non-toxic appearing.  She was pending a 2nd attempt MRI for MRI head and Cervical spine, and a plan for consultation with neurology afterwards regarding treatment options for suspected MS.  See overnight EDP notes for further information.   Terald Sleeper, MD 11/18/19 1319

## 2019-11-18 NOTE — ED Provider Notes (Signed)
MRI shows no active demyelinating disease.  I discussed the case with Dr. Wilford Corner, who states that patient can get 1000 mg of IV Solu-Medrol if she says that this feels like prior MS exacerbations, but he does not recommend admission to the hospital based on MRI findings.  He states the patient can follow-up with her regular neurologist on an outpatient basis and return if symptoms worsen.  I discussed this plan with the patient, who understands and agrees.   Roxy Horseman, PA-C 11/18/19 0515    Palumbo, April, MD 11/18/19 [phone]

## 2019-11-18 NOTE — ED Notes (Signed)
Patient transported to MRI 

## 2019-11-19 LAB — URINE CULTURE

## 2019-12-07 ENCOUNTER — Encounter: Payer: Self-pay | Admitting: Neurology

## 2019-12-07 ENCOUNTER — Ambulatory Visit (INDEPENDENT_AMBULATORY_CARE_PROVIDER_SITE_OTHER): Payer: Medicare Other | Admitting: Neurology

## 2019-12-07 VITALS — BP 152/89 | HR 115 | Ht 63.0 in | Wt 280.4 lb

## 2019-12-07 DIAGNOSIS — E1142 Type 2 diabetes mellitus with diabetic polyneuropathy: Secondary | ICD-10-CM | POA: Diagnosis not present

## 2019-12-07 DIAGNOSIS — Z79899 Other long term (current) drug therapy: Secondary | ICD-10-CM

## 2019-12-07 DIAGNOSIS — G35 Multiple sclerosis: Secondary | ICD-10-CM

## 2019-12-07 DIAGNOSIS — F418 Other specified anxiety disorders: Secondary | ICD-10-CM

## 2019-12-07 DIAGNOSIS — G35D Multiple sclerosis, unspecified: Secondary | ICD-10-CM

## 2019-12-07 DIAGNOSIS — M47816 Spondylosis without myelopathy or radiculopathy, lumbar region: Secondary | ICD-10-CM

## 2019-12-07 DIAGNOSIS — G8929 Other chronic pain: Secondary | ICD-10-CM

## 2019-12-07 MED ORDER — LAMOTRIGINE 100 MG PO TABS: 100.0000 mg | ORAL_TABLET | Freq: Two times a day (BID) | ORAL | 5 refills | Status: DC

## 2019-12-07 NOTE — Patient Instructions (Signed)
The pharmacy has the prescription for lamotrigine 100 mg tablets. For 5 days, just take one half pill a day. For the next 5 days, take one half pill twice a day. For the next 5 days, take one half pill 3 times a day Then start taking one pill twice a day from this point on.      If you get a rash, need to stop the medication and not take it again. 

## 2019-12-07 NOTE — Progress Notes (Signed)
[facility]  PATIENT: [name] DOB: 1960/12/19  REFERRING DOCTOR OR PCP: Lucky Cowboy, FNP SOURCE: Patient, notes from primary care, imaging and lab reports, MRI images personally reviewed.  _________________________________   HISTORICAL  CHIEF COMPLAINT:  Chief Complaint  Patient presents with  . New Patient (Initial Visit)    RM 12, alone. She recently moved back from [facility]. Paper referral from Dr. Irwin Brakeman for fibromyalgia/MS. Bladder stimulator not working, building up fluids in legs. US done today on right leg.   . Gait Problem    Ambulates with cane.  . Multiple Sclerosis    Dx in 2008. Previously been on Rebif/Tysabri. Now on Tecfidera. Been on this for 4-5 yr now.  Has no family hx of MS.   . Pain    Follows pain management at [facility] with Dr. Irwin Brakeman. Currently was recommend for her to change oxycodone from 10 to 55m po q 6hrs and lower her nucynta to 1055mfrom 20054mShe feels nucynta has been ineffective.     HISTORY OF PRESENT ILLNESS:  I had the pleasure seeing your patient[facility] for [facility] regarding her multiple sclerosis.  She is a 58 55ar old woman who was diagnosed with MS in 2008 after presenting with leg weakness and headache, neck pain, back pain.  MRIs showed foci in her cervical spine and brain.   She was started on Rebif.   Initially she did well but she reports having a lot of relapses.   She was switched to Copaxone but also had more symptoms.   She was then switched to Tysabri for several years but reports having several relapses.   She switched to Tecfidera 5 years ago.   She reports having a flare 3 years ago and had 5 days of IV Solu-medrol.   She was complaining of blurry vision, stiff neck and increased burning and tingling a few weeks ago.  She went to the ED and had MRIs performed showing no new lesions.    Currently, she reports reduced gait and uses a  cane.  She reports legs seem weak.   She reports a lot of burning pain and lower back pain.   [place] medial branch [place] and had a benefit that seemed to last until recently.   She reports tingling of all 4 extremities.  She was on gabapentin but it was recently stopped as it seemed to stop working for her.     She reports difficulty with vision and has needed a new pair of glasses every year recently.     She has urinary urgency and frequency.   She has a bladder stimulator (Interstim0 but she can't charge it.  [place] is supposed to see a urologist next month.      She reports a lot of insomnia with problems with both sleep onset and maintenance.   Besides controlled substances for pain she is also on zolpidem.  [facility]   She is on oxycodone, Nucynta and zolpidem.  She is also on diphenoxylate for bowel issues  MRI of the cervical spine 11/18/2019 and 03/02/2012 showed two T2 hyperintense foci adjacent to C2 and C3-C4.  There was mild multilevel degenerative changes as well.    MRI of the brain 11/18/2019 and 03/02/2020 show a fairly low plaque burden of T2/FLAIR hyperintense foci.  Most of them are  nonspecific.  A few are periventricular.  None acute and no change over time.  REVIEW OF SYSTEMS: Constitutional: No fevers, chills, sweats, or change in appetite.  She has fatigue and she reports insomnia Eyes: No visual changes, double vision, eye pain Ear, nose and throat: No hearing loss, ear pain, nasal congestion, sore throat Cardiovascular: No chest pain, palpitations Respiratory: No shortness of breath at rest or with exertion.   No wheezes GastrointestinaI: No nausea, vomiting, diarrhea, abdominal pain, fecal incontinence Genitourinary: No dysuria, urinary retention or frequency.  No nocturia. Musculoskeletal:Pain all over in muscles, joints and spine Integumentary: No rash, pruritus,  skin lesions Neurological: as above Psychiatric: She has depression and anxiety Endocrine: No palpitations, diaphoresis, change in appetite, change in weigh or increased thirst Hematologic/Lymphatic: No anemia, purpura, petechiae. Allergic/Immunologic: No itchy/runny eyes, nasal congestion, recent allergic reactions, rashes  ALLERGIES: No Known Allergies  HOME MEDICATIONS:  Current Outpatient Medications:  .  acetaminophen (PAIN RELIEF) 325 MG tablet, Take 650 mg by mouth 3 (three) times daily as needed for moderate pain., Disp: , Rfl:  .  amitriptyline (ELAVIL) 25 MG tablet, Take 25 mg by mouth at bedtime., Disp: , Rfl:  .  ANTI-DIARRHEAL 2 MG tablet, Take 2 mg by mouth as needed for diarrhea or loose stools. , Disp: , Rfl:  .  atorvastatin (LIPITOR) 80 MG tablet, Take 1 tablet (80 mg total) by mouth daily. (Patient taking differently: Take 80 mg by mouth every evening. ), Disp: 90 tablet, Rfl: 3 .  baclofen (LIORESAL) 20 MG tablet, Take 20 mg by mouth 2 (two) times daily. , Disp: , Rfl:  .  busPIRone (BUSPAR) 15 MG tablet, Take 15 mg by mouth 3 (three) times daily., Disp: , Rfl:  .  clotrimazole (MYCELEX) 10 MG troche, Take 10 mg by mouth 3 (three) times daily as needed (thrush/vaginal yeast infections). , Disp: , Rfl:  .  dicyclomine (BENTYL) 20 MG tablet, Take 40 mg by mouth in the morning and at bedtime. , Disp: , Rfl:  .  diphenoxylate-atropine (LOMOTIL) 2.5-0.025 MG per tablet, Take 1 tablet by mouth 2 (two) times daily. PATIENT NEEDS OFFICE VISIT FOR ADDITIONAL REFILLS (Patient taking differently: Take 1 tablet by mouth in the morning, at noon, and at bedtime. PATIENT NEEDS OFFICE VISIT FOR ADDITIONAL REFILLS), Disp: 60 tablet, Rfl: 0 .  DULoxetine (CYMBALTA) 60 MG capsule, Take 60 mg by mouth 2 (two) times daily. , Disp: , Rfl:  .  Echinacea 400 MG CAPS, Take 400 mg by mouth in the morning and at bedtime., Disp: , Rfl:  .  fluticasone (FLONASE) 50 MCG/ACT nasal spray, Place 2 sprays  into the nose daily. (Patient taking differently: Place 1 spray into the nose in the morning and at bedtime. ), Disp: 16 g, Rfl: 11 .  Homeopathic Products (LEG CRAMPS PO), Take 1-2 tablets by mouth at bedtime., Disp: , Rfl:  .  ibuprofen (ADVIL) 800 MG tablet, Take 800 mg by mouth 3 (three) times daily as needed for moderate pain. , Disp: , Rfl:  .  losartan (COZAAR) 100 MG tablet, Take 1 tablet (100 mg total) by mouth daily. (Patient taking differently: Take 100 mg by mouth every evening. ), Disp: 90 tablet, Rfl: 3 .  MAGNESIUM-OXIDE 400 (241.3 Mg) MG tablet, Take 1 tablet by mouth daily., Disp: , Rfl:  .  MELATONIN MAXIMUM STRENGTH 5 MG TABS, Take 15 mg by mouth at bedtime., Disp: , Rfl:  .  montelukast (SINGULAIR) 10 MG tablet, Take  10 mg by mouth daily., Disp: , Rfl:  .  MUCUS RELIEF 400 MG TABS tablet, Take 400 mg by mouth 2 (two) times daily., Disp: , Rfl:  .  Multiple Vitamins-Calcium (ONE-A-DAY WOMENS FORMULA) TABS, Take 1 tablet by mouth daily., Disp: , Rfl:  .  Multiple Vitamins-Minerals (AIRBORNE) CHEW, Chew 3 tablets by mouth daily. , Disp: , Rfl:  .  NARCAN 4 MG/0.1ML LIQD nasal spray kit, Place 1 spray into the nose as directed. overdose, Disp: , Rfl:  .  NIFEdipine (PROCARDIA-XL/ADALAT-CC/NIFEDICAL-XL) 30 MG 24 hr tablet, Take 30 mg by mouth daily., Disp: , Rfl:  .  NUCYNTA ER 200 MG TB12, Take 1 tablet by mouth 2 (two) times daily., Disp: , Rfl:  .  omeprazole (PRILOSEC) 20 MG capsule, Take 1 capsule (20 mg total) by mouth daily., Disp: 90 capsule, Rfl: 3 .  Oxycodone HCl 10 MG TABS, Take 10 mg by mouth every 6 (six) hours., Disp: , Rfl:  .  pantoprazole (PROTONIX) 40 MG tablet, Take 40 mg by mouth 2 (two) times daily., Disp: , Rfl:  .  TECFIDERA 240 MG CPDR, Take 1 capsule by mouth 2 (two) times daily., Disp: , Rfl:  .  tiZANidine (ZANAFLEX) 2 MG tablet, Take 2 mg by mouth 2 (two) times daily., Disp: , Rfl:  .  topiramate (TOPAMAX) 100 MG tablet, Take 1 tablet (100 mg total) by  mouth daily., Disp: 90 tablet, Rfl: 3 .  VIBERZI 100 MG TABS, Take 1 tablet by mouth 2 (two) times daily., Disp: , Rfl:  .  vitamin B-12 (CYANOCOBALAMIN) 1000 MCG tablet, Take 1,000 mcg by mouth daily. , Disp: , Rfl:  .  XIIDRA 5 % SOLN, Place 1 drop into both eyes in the morning and at bedtime. , Disp: , Rfl:  .  zolpidem (AMBIEN) 10 MG tablet, Take 10 mg by mouth at bedtime. , Disp: , Rfl:  .  lamoTRIgine (LAMICTAL) 100 MG tablet, Take 1 tablet (100 mg total) by mouth 2 (two) times daily., Disp: 60 tablet, Rfl: 5 .  oxyCODONE (ROXICODONE) 15 MG immediate release tablet, Take 15 mg by mouth every 6 (six) hours. (Patient not taking: Reported on 11/17/2019), Disp: , Rfl:   PAST MEDICAL HISTORY: Past Medical History:  Diagnosis Date  . Diabetes mellitus   . Hypertension   . Multiple sclerosis exacerbation ([place])     PAST SURGICAL HISTORY: Past Surgical History:  Procedure Laterality Date  . ABDOMINAL HYSTERECTOMY     partial  . JOINT REPLACEMENT     LTK  . TUBAL LIGATION      FAMILY HISTORY: Family History  Problem Relation Age of Onset  . Diabetes Mother   . Hypertension Mother   . Stroke Father     SOCIAL HISTORY:  Social History   Socioeconomic History  . Marital status: Single    Spouse name: Not on file  . Number of children: Not on file  . Years of education: Not on file  . Highest education level: Not on file  Occupational History  . Not on file  Tobacco Use  . Smoking status: Current Every Day Smoker    Packs/day: 0.25    Years: 12.00    Pack years: 3.00    Types: Cigarettes  . Smokeless tobacco: Never Used  Substance and Sexual Activity  . Alcohol use: No  . Drug use: No  . Sexual activity: Not on file  Other Topics Concern  . Not on file  Social History  Narrative  . Not on file   Social Determinants of Health   Financial Resource Strain:   . Difficulty of Paying Living Expenses:   Food Insecurity:   . Worried About Charity fundraiser in the  Last Year:   . Arboriculturist in the Last Year:   Transportation Needs:   . Film/video editor (Medical):   Marland Kitchen Lack of Transportation (Non-Medical):   Physical Activity:   . Days of Exercise per Week:   . Minutes of Exercise per Session:   Stress:   . Feeling of Stress :   Social Connections:   . Frequency of Communication with Friends and Family:   . Frequency of Social Gatherings with Friends and Family:   . Attends Religious Services:   . Active Member of Clubs or Organizations:   . Attends Archivist Meetings:   Marland Kitchen Marital Status:   Intimate Partner Violence:   . Fear of Current or Ex-Partner:   . Emotionally Abused:   Marland Kitchen Physically Abused:   . Sexually Abused:      PHYSICAL EXAM  Vitals:   12/07/19 1340  BP: (!) 152/89  Pulse: (!) 115  Weight: (!) 280 lb 6.4 oz (127.2 kg)  Height: 5' 3"  (1.6 m)    Body mass index is 49.67 kg/m.   General: The patient is well-developed and well-nourished and in no acute distress  HEENT:  Head is [place]/AT.  Sclera are anicteric.  Funduscopic exam shows normal optic discs and retinal vessels.  Neck: No carotid bruits are noted.  The neck is nontender.  Cardiovascular: The heart has a regular rate and rhythm with a normal S1 and S2. There were no murmurs, gallops or rubs.    Skin: Extremities are without rash.  She has some edema at the ankles.  Musculoskeletal: She reports tenderness over the paraspinal muscles of the neck and lower back and myalgias at typical fibromyalgia tender points.  Neurologic Exam  Mental status: The patient is alert and oriented x 3 at the time of the examination. The patient has apparent normal recent and remote memory, with an apparently normal attention span and concentration ability.   Speech is normal.  Cranial nerves: Extraocular movements are full. Pupils are equal, round, and reactive to light and accomodation.  Visual fields are full.  Facial symmetry is present. There is good facial  sensation to soft touch bilaterally.Facial strength is normal.  Trapezius and sternocleidomastoid strength is normal. No dysarthria is noted.  The tongue is midline, and the patient has symmetric elevation of the soft palate. No obvious hearing deficits are noted.  Motor:  Muscle bulk is normal.   Tone is normal. Strength is  5 / 5 in all 4 extremities.   Sensory: Sensory testing is intact to pinprick, soft touch and vibration sensation in the arms but she has reduced vibration sensation in the..  Coordination: Cerebellar testing reveals good finger-nose-finger and heel-to-shin bilaterally.  Gait and station: Station is normal.   Gait is arthritic. Tandem gait is wide. Romberg is negative.   Reflexes: Deep tendon reflexes are 1 and symmetric in the limbs.   Plantar responses are flexor.    DIAGNOSTIC DATA (LABS, IMAGING, TESTING) - I reviewed patient records, labs, notes, testing and imaging myself where available.  Lab Results  Component Value Date   WBC 8.9 12/07/2019   HGB 14.4 12/07/2019   HCT 41.9 12/07/2019   MCV 86 12/07/2019   PLT 277 12/07/2019  Component Value Date/Time   NA 142 11/17/2019 1242   K 3.3 (L) 11/17/2019 1242   CL 107 11/17/2019 1242   CO2 24 11/17/2019 1242   GLUCOSE 81 11/17/2019 1242   BUN 11 11/17/2019 1242   CREATININE 0.92 11/17/2019 1242   CREATININE 0.85 02/24/2012 1044   CALCIUM 9.5 11/17/2019 1242   PROT 6.7 03/01/2012 1653   ALBUMIN 3.4 (L) 03/01/2012 1653   AST 26 03/01/2012 1653   ALT 24 03/01/2012 1653   ALKPHOS 98 03/01/2012 1653   BILITOT 0.7 03/01/2012 1653   GFRNONAA >60 11/17/2019 1242   GFRAA >60 11/17/2019 1242   Lab Results  Component Value Date   CHOL (H) 02/19/2009    237        ATP III CLASSIFICATION:  <200     mg/dL   Desirable  200-239  mg/dL   Borderline High  >=240    mg/dL   High          HDL 52 02/19/2009   LDLCALC (H) 02/19/2009    135        Total Cholesterol/HDL:CHD Risk Coronary Heart Disease Risk  Table                     Men   Women  1/2 Average Risk   3.4   3.3  Average Risk       5.0   4.4  2 X Average Risk   9.6   7.1  3 X Average Risk  23.4   11.0        Use the calculated Patient Ratio above and the CHD Risk Table to determine the patient's CHD Risk.        ATP III CLASSIFICATION (LDL):  <100     mg/dL   Optimal  100-129  mg/dL   Near or Above                    Optimal  130-159  mg/dL   Borderline  160-189  mg/dL   High  >190     mg/dL   Very High   TRIG 249 (H) 02/19/2009   CHOLHDL 4.6 02/19/2009   Lab Results  Component Value Date   HGBA1C 5.3 03/01/2012   Lab Results  Component Value Date   VITAMINB12 711 11/06/2008   Lab Results  Component Value Date   TSH 2.566 02/24/2012       ASSESSMENT AND PLAN  Multiple sclerosis ([place]) - Plan: CBC with Differential/Platelet  High risk medication use - Plan: CBC with Differential/Platelet  Depression with anxiety  Diabetic peripheral neuropathy (HCC)  Lumbar spondylosis  Other chronic pain   In summary, Ms. Bamburg is a 59 year old woman with multiple sclerosis.  She has a fairly low plaque burden in the brain but does have 2 foci in the cervical spine which would be consistent with the diagnosis.  She is on Tecfidera and tolerates it well.  We will check some lab work to make sure that the CBC and differential are fine.  This will need to get checked about every 6 months.  She reports that she is in a lot of pain.  This is despite being on fairly high dose of opiates.  It is possible that the spinal plaques are contributing to her pain.  Lamotrigine can sometimes help pain related to MS plaque in the spine and I will initiate this and titrate up to 100 mg twice a day.  She  also reports problems with insomnia.  She is already on zolpidem and amitriptyline and baclofen medication and I did not prescribe any additional medicine for this as it would be unlikely to help.  She will continue to see [facility] medical  for pain management.  In the past, she reports having radiofrequency ablation for her back.  These typically will help for 12 to 18 months so she may need to have this type of procedure done again.  She will return to see [place] in 6 months for MS related issues.  She should call sooner if new or worsening neurologic symptoms   Rosalva Neary A. Felecia Shelling, MD, [facility]ifford Shave 4/88/8916, 9:45 PM Certified in Neurology, Clinical Neurophysiology, Sleep Medicine and Neuroimaging  [facility] [address], [place], [place] [address] [phone]

## 2019-12-08 ENCOUNTER — Encounter: Payer: Self-pay | Admitting: Neurology

## 2019-12-08 DIAGNOSIS — Z79899 Other long term (current) drug therapy: Secondary | ICD-10-CM | POA: Insufficient documentation

## 2019-12-08 LAB — CBC WITH DIFFERENTIAL/PLATELET
Basophils Absolute: 0 10*3/uL (ref 0.0–0.2)
Basos: 0 %
EOS (ABSOLUTE): 0.1 10*3/uL (ref 0.0–0.4)
Eos: 1 %
Hematocrit: 41.9 % (ref 34.0–46.6)
Hemoglobin: 14.4 g/dL (ref 11.1–15.9)
Immature Grans (Abs): 0.1 10*3/uL (ref 0.0–0.1)
Immature Granulocytes: 1 %
Lymphocytes Absolute: 3.4 10*3/uL — ABNORMAL HIGH (ref 0.7–3.1)
Lymphs: 38 %
MCH: 29.7 pg (ref 26.6–33.0)
MCHC: 34.4 g/dL (ref 31.5–35.7)
MCV: 86 fL (ref 79–97)
Monocytes Absolute: 0.8 10*3/uL (ref 0.1–0.9)
Monocytes: 9 %
Neutrophils Absolute: 4.6 10*3/uL (ref 1.4–7.0)
Neutrophils: 51 %
Platelets: 277 10*3/uL (ref 150–450)
RBC: 4.85 x10E6/uL (ref 3.77–5.28)
RDW: 13.1 % (ref 11.7–15.4)
WBC: 8.9 10*3/uL (ref 3.4–10.8)

## 2019-12-11 ENCOUNTER — Telehealth: Payer: Self-pay | Admitting: *Deleted

## 2019-12-11 NOTE — Telephone Encounter (Signed)
-----   Message from Asa Lente, MD sent at 12/08/2019  9:51 AM EDT ----- Please let her know that the lab work looks good

## 2019-12-11 NOTE — Telephone Encounter (Signed)
Called, LVM relaying results per Dr. Epimenio Foot note. Advised her to call office back if she has any further questions/concerns.

## 2019-12-19 DIAGNOSIS — N319 Neuromuscular dysfunction of bladder, unspecified: Secondary | ICD-10-CM | POA: Insufficient documentation

## 2019-12-19 DIAGNOSIS — R339 Retention of urine, unspecified: Secondary | ICD-10-CM | POA: Insufficient documentation

## 2020-01-04 ENCOUNTER — Other Ambulatory Visit: Payer: Self-pay | Admitting: Rehabilitation

## 2020-01-04 ENCOUNTER — Telehealth: Payer: Self-pay | Admitting: Nurse Practitioner

## 2020-01-04 DIAGNOSIS — G8929 Other chronic pain: Secondary | ICD-10-CM

## 2020-01-04 NOTE — Telephone Encounter (Signed)
Phone call to patient to verify medication list and allergies for myelogram procedure. Pt instructed to hold elavil, buspar, cymbalta, and nucynta for 48hrs prior to myelogram appointment time. Pt verbalized understanding. Pre and post procedure instructions reviewed with pt.

## 2020-01-16 ENCOUNTER — Other Ambulatory Visit: Payer: Self-pay

## 2020-01-16 ENCOUNTER — Other Ambulatory Visit: Payer: Medicare Other

## 2020-01-16 ENCOUNTER — Ambulatory Visit
Admission: RE | Admit: 2020-01-16 | Discharge: 2020-01-16 | Disposition: A | Discharge status: Home / Self Care | Payer: Medicare Other | Source: Ambulatory Visit | Attending: Rehabilitation | Admitting: Rehabilitation

## 2020-01-16 VITALS — BP 150/97 | HR 94

## 2020-01-16 DIAGNOSIS — M545 Low back pain, unspecified: Secondary | ICD-10-CM

## 2020-01-16 DIAGNOSIS — G8929 Other chronic pain: Secondary | ICD-10-CM

## 2020-01-16 DIAGNOSIS — M47816 Spondylosis without myelopathy or radiculopathy, lumbar region: Secondary | ICD-10-CM

## 2020-01-16 MED ORDER — DIAZEPAM 5 MG PO TABS
10.0000 mg | ORAL_TABLET | Freq: Once | ORAL | Status: AC
  Administered 2020-01-16: 10 mg via ORAL

## 2020-01-16 MED ORDER — IOPAMIDOL (ISOVUE-M 300) INJECTION 61%: 15.0000 mL | Freq: Once | INTRAMUSCULAR | Status: DC | PRN

## 2020-01-16 MED ORDER — MEPERIDINE HCL 100 MG/ML IJ SOLN
100.0000 mg | Freq: Once | INTRAMUSCULAR | Status: AC
  Administered 2020-01-16: 100 mg via INTRAMUSCULAR

## 2020-01-16 MED ORDER — ONDANSETRON HCL 4 MG/2ML IJ SOLN
4.0000 mg | Freq: Once | INTRAMUSCULAR | Status: AC
  Administered 2020-01-16: 4 mg via INTRAMUSCULAR

## 2020-01-16 MED ORDER — IOPAMIDOL (ISOVUE-M 200) INJECTION 41%
15.0000 mL | Freq: Once | INTRAMUSCULAR | Status: AC
  Administered 2020-01-16: 15 mL via INTRATHECAL

## 2020-01-16 NOTE — Progress Notes (Signed)
Patient states she has been off Amitriptyline, Buspirone, Duloxetine and Nucynta for at least the past two days.

## 2020-01-16 NOTE — Discharge Instructions (Signed)
Myelogram Discharge Instructions  1. Go home and rest quietly for the next 24 hours.  It is important to lie flat for the next 24 hours.  Get up only to go to the restroom.  You may lie in the bed or on a couch on your back, your stomach, your left side or your right side.  You may have one pillow under your head.  You may have pillows between your knees while you are on your side or under your knees while you are on your back.  2. DO NOT drive today.  Recline the seat as far back as it will go, while still wearing your seat belt, on the way home.  3. You may get up to go to the bathroom as needed.  You may sit up for 10 minutes to eat.  You may resume your normal diet and medications unless otherwise indicated.  Drink lots of extra fluids today and tomorrow.  4. The incidence of headache, nausea, or vomiting is about 5% (one in 20 patients).  If you develop a headache, lie flat and drink plenty of fluids until the headache goes away.  Caffeinated beverages may be helpful.  If you develop severe nausea and vomiting or a headache that does not go away with flat bed rest, call [phone].  5. You may resume normal activities after your 24 hours of bed rest is over; however, do not exert yourself strongly or do any heavy lifting tomorrow. If when you get up you have a headache when standing, go back to bed and force fluids for another 24 hours.  6. Call your physician for a follow-up appointment.  The results of your myelogram will be sent directly to your physician by the following day.  7. If you have any questions or if complications develop after you arrive home, please call [phone].  Discharge instructions have been explained to the patient.  The patient, or the person responsible for the patient, fully understands these instructions.  YOU MAY RESTART YOUR AMITRIPTYLINE, BUSPAR, NUCYNTA AND DULOXETINE TOMORROW 01/17/20 AT 10:30AM.

## 2020-03-14 ENCOUNTER — Other Ambulatory Visit: Payer: Self-pay | Admitting: Family Medicine

## 2020-03-14 ENCOUNTER — Other Ambulatory Visit: Payer: Self-pay

## 2020-03-14 DIAGNOSIS — R600 Localized edema: Secondary | ICD-10-CM

## 2020-03-26 ENCOUNTER — Inpatient Hospital Stay: Admission: RE | Admit: 2020-03-26 | Payer: Medicare Other | Source: Ambulatory Visit

## 2020-03-26 ENCOUNTER — Other Ambulatory Visit: Payer: Self-pay | Admitting: Endocrinology

## 2020-03-26 DIAGNOSIS — Z1231 Encounter for screening mammogram for malignant neoplasm of breast: Secondary | ICD-10-CM

## 2020-04-02 ENCOUNTER — Other Ambulatory Visit: Payer: Self-pay

## 2020-04-02 ENCOUNTER — Ambulatory Visit
Admission: RE | Admit: 2020-04-02 | Discharge: 2020-04-02 | Disposition: A | Discharge status: Home / Self Care | Payer: Medicare Other | Source: Ambulatory Visit | Attending: Endocrinology | Admitting: Endocrinology

## 2020-04-02 DIAGNOSIS — Z1231 Encounter for screening mammogram for malignant neoplasm of breast: Secondary | ICD-10-CM

## 2020-04-15 ENCOUNTER — Ambulatory Visit
Admission: RE | Admit: 2020-04-15 | Discharge: 2020-04-15 | Disposition: A | Discharge status: Home / Self Care | Payer: Medicare Other | Source: Ambulatory Visit | Attending: Family Medicine | Admitting: Family Medicine

## 2020-04-15 ENCOUNTER — Other Ambulatory Visit: Payer: Self-pay | Admitting: Family Medicine

## 2020-04-15 ENCOUNTER — Other Ambulatory Visit: Payer: Self-pay

## 2020-04-15 ENCOUNTER — Other Ambulatory Visit: Payer: Medicare Other

## 2020-04-15 DIAGNOSIS — R600 Localized edema: Secondary | ICD-10-CM

## 2020-04-15 MED ORDER — IOPAMIDOL (ISOVUE-370) INJECTION 76%
100.0000 mL | Freq: Once | INTRAVENOUS | Status: AC | PRN
  Administered 2020-04-15: 100 mL via INTRAVENOUS

## 2020-04-16 ENCOUNTER — Other Ambulatory Visit: Payer: Self-pay | Admitting: Family Medicine

## 2020-04-16 ENCOUNTER — Other Ambulatory Visit: Payer: Self-pay

## 2020-05-06 ENCOUNTER — Ambulatory Visit: Payer: Medicare Other

## 2020-06-04 ENCOUNTER — Other Ambulatory Visit: Payer: Self-pay | Admitting: Neurology

## 2020-06-13 ENCOUNTER — Ambulatory Visit (INDEPENDENT_AMBULATORY_CARE_PROVIDER_SITE_OTHER): Payer: Medicare Other | Admitting: Neurology

## 2020-06-13 ENCOUNTER — Telehealth: Payer: Self-pay | Admitting: Neurology

## 2020-06-13 ENCOUNTER — Encounter: Payer: Self-pay | Admitting: Neurology

## 2020-06-13 ENCOUNTER — Other Ambulatory Visit: Payer: Self-pay

## 2020-06-13 VITALS — BP 133/77 | HR 104 | Ht 63.0 in | Wt 286.5 lb

## 2020-06-13 DIAGNOSIS — M47816 Spondylosis without myelopathy or radiculopathy, lumbar region: Secondary | ICD-10-CM | POA: Diagnosis not present

## 2020-06-13 DIAGNOSIS — G8929 Other chronic pain: Secondary | ICD-10-CM | POA: Diagnosis not present

## 2020-06-13 DIAGNOSIS — F418 Other specified anxiety disorders: Secondary | ICD-10-CM

## 2020-06-13 DIAGNOSIS — Z79899 Other long term (current) drug therapy: Secondary | ICD-10-CM | POA: Diagnosis not present

## 2020-06-13 DIAGNOSIS — R269 Unspecified abnormalities of gait and mobility: Secondary | ICD-10-CM | POA: Insufficient documentation

## 2020-06-13 DIAGNOSIS — G35 Multiple sclerosis: Secondary | ICD-10-CM

## 2020-06-13 MED ORDER — LAMOTRIGINE 200 MG PO TABS: ORAL_TABLET | ORAL | 11 refills | Status: DC

## 2020-06-13 NOTE — Progress Notes (Signed)
[facility]  PATIENT: [name] DOB: 1961-03-30  REFERRING DOCTOR OR PCP: Lucky Cowboy, FNP SOURCE: Patient, notes from primary care, imaging and lab reports, MRI images personally reviewed.  _________________________________   HISTORICAL  CHIEF COMPLAINT:  Chief Complaint  Patient presents with  . Follow-up    RM 12. Last seen 12/07/2019. On Tecfidera for MS. "I have on and off days, get more tired, pain in legs is getting stronger-goes up to middle of my back, hands shake more, feel off balance more"    HISTORY OF PRESENT ILLNESS:  [name] is a 60 y.o. woman with multiple sclerosis.   Update 06/13/2020: She is on Tecficdera and tolerates it well.   She has no exacerbations but it noting some swelling in her ankles.  Balance is reduced and she uses a cane for safety,especially outside.    She reports a lot of burning dysesthetic pain in her arms and legs..  She also has LBP.   She had medial branch blocks/RFA in [place] about 3 years ago and had a benefit that seemed to last until recently.   She reports tingling of all 4 extremities.  She was on gabapentin but it was recently stopped as it seemed to stop working for her.   She is on lamotrigine as well for pain.   She is on topiramate (not clear why as does not have migraine).  She reports difficulty with vision and has needed a new pair of glasses every year recently.    She has urinary urgency and frequency.   She has a bladder stimulator (Interstim) but she can't charge it.  She sees [facility].    She reports a lot of insomnia with problems with both sleep onset and maintenance.   Even with her pain medications and Ambien, sleep is poor (4-6 hours many nights -- falls asleep well but then wakes up).    She sees [facility].   She is on oxycodone, Nucynta and zolpidem.  She is also on diphenoxylate for bowel issues.   She also had recent ESI at a spine center  (Dr. Maia Petties)   [place] She was diagnosed with MS in 2008 after presenting with leg weakness and headache, neck pain, back pain.  MRIs showed foci in her cervical spine and brain.   She was started on Rebif.   Initially she did well but she reports having a lot of relapses.   She was switched to Copaxone but also had more symptoms.   She was then switched to Tysabri for several years but reports having several relapses.   She switched to Tecfidera 5 years ago.   She reports having a flare 3 years ago and had 5 days of IV Solu-medrol.   July 2021, she was complaining of blurry vision, stiff neck and increased burning and tingling a few weeks ago.  She went to the ED and had MRIs performed showing no new lesions.    IMAGING: MRI of the cervical spine 11/18/2019 and 03/02/2012 showed two T2 hyperintense foci adjacent to C2 and C3-C4.  There was mild multilevel degenerative changes as well.    MRI of the brain 11/18/2019 and 03/02/2020 show a fairly low plaque burden of T2/FLAIR hyperintense foci.  Most of them are nonspecific.  A few are periventricular.  None acute and no change over time.  REVIEW OF SYSTEMS: Constitutional: No fevers, chills, sweats, or change in appetite.  She has fatigue and  she reports insomnia Eyes: No visual changes, double vision, eye pain Ear, nose and throat: No hearing loss, ear pain, nasal congestion, sore throat Cardiovascular: No chest pain, palpitations Respiratory: No shortness of breath at rest or with exertion.   No wheezes GastrointestinaI: No nausea, vomiting, diarrhea, abdominal pain, fecal incontinence Genitourinary: No dysuria, urinary retention or frequency.  No nocturia. Musculoskeletal:Pain all over in muscles, joints and spine Integumentary: No rash, pruritus, skin lesions Neurological: as above Psychiatric: She has depression and anxiety Endocrine: No palpitations, diaphoresis, change in appetite, change in weigh or increased  thirst Hematologic/Lymphatic: No anemia, purpura, petechiae. Allergic/Immunologic: No itchy/runny eyes, nasal congestion, recent allergic reactions, rashes  ALLERGIES: No Known Allergies  HOME MEDICATIONS:  Current Outpatient Medications:  .  amitriptyline (ELAVIL) 25 MG tablet, Take 25 mg by mouth at bedtime., Disp: , Rfl:  .  ANTI-DIARRHEAL 2 MG tablet, Take 2 mg by mouth as needed for diarrhea or loose stools. , Disp: , Rfl:  .  atorvastatin (LIPITOR) 80 MG tablet, Take 1 tablet (80 mg total) by mouth daily. (Patient taking differently: Take 80 mg by mouth every evening.), Disp: 90 tablet, Rfl: 3 .  baclofen (LIORESAL) 20 MG tablet, Take 20 mg by mouth 2 (two) times daily. , Disp: , Rfl:  .  BIOTIN PO, Take by mouth., Disp: , Rfl:  .  busPIRone (BUSPAR) 15 MG tablet, Take 15 mg by mouth 3 (three) times daily., Disp: , Rfl:  .  clotrimazole (MYCELEX) 10 MG troche, Take 10 mg by mouth 3 (three) times daily as needed (thrush/vaginal yeast infections). , Disp: , Rfl:  .  dicyclomine (BENTYL) 20 MG tablet, Take 40 mg by mouth in the morning and at bedtime. , Disp: , Rfl:  .  diphenoxylate-atropine (LOMOTIL) 2.5-0.025 MG per tablet, Take 1 tablet by mouth 2 (two) times daily. PATIENT NEEDS OFFICE VISIT FOR ADDITIONAL REFILLS (Patient taking differently: Take 1 tablet by mouth in the morning, at noon, and at bedtime. PATIENT NEEDS OFFICE VISIT FOR ADDITIONAL REFILLS), Disp: 60 tablet, Rfl: 0 .  DULoxetine (CYMBALTA) 60 MG capsule, Take 60 mg by mouth 2 (two) times daily. , Disp: , Rfl:  .  Echinacea 400 MG CAPS, Take 400 mg by mouth in the morning and at bedtime., Disp: , Rfl:  .  fluticasone (FLONASE) 50 MCG/ACT nasal spray, Place 2 sprays into the nose daily. (Patient taking differently: Place 1 spray into the nose in the morning and at bedtime.), Disp: 16 g, Rfl: 11 .  Homeopathic Products (LEG CRAMPS PO), Take 1-2 tablets by mouth at bedtime., Disp: , Rfl:  .  ibuprofen (ADVIL) 800 MG tablet,  Take 800 mg by mouth 3 (three) times daily as needed for moderate pain. , Disp: , Rfl:  .  losartan (COZAAR) 100 MG tablet, Take 1 tablet (100 mg total) by mouth daily. (Patient taking differently: Take 100 mg by mouth every evening.), Disp: 90 tablet, Rfl: 3 .  MAGNESIUM-OXIDE 400 (241.3 Mg) MG tablet, Take 1 tablet by mouth daily., Disp: , Rfl:  .  MELATONIN MAXIMUM STRENGTH 5 MG TABS, Take 15 mg by mouth at bedtime., Disp: , Rfl:  .  montelukast (SINGULAIR) 10 MG tablet, Take 10 mg by mouth daily., Disp: , Rfl:  .  MUCUS RELIEF 400 MG TABS tablet, Take 400 mg by mouth 2 (two) times daily., Disp: , Rfl:  .  Multiple Vitamins-Calcium (ONE-A-DAY WOMENS FORMULA) TABS, Take 1 tablet by mouth daily., Disp: , Rfl:  .  Multiple Vitamins-Minerals (AIR[place]NE) CHEW, Chew 3 tablets by mouth daily. , Disp: , Rfl:  .  NARCAN 4 MG/0.1ML LIQD nasal spray kit, Place 1 spray into the nose as directed. overdose, Disp: , Rfl:  .  NIFEdipine (PROCARDIA-XL/ADALAT-CC/NIFEDICAL-XL) 30 MG 24 hr tablet, Take 30 mg by mouth daily., Disp: , Rfl:  .  NUCYNTA ER 200 MG TB12, Take 1 tablet by mouth 2 (two) times daily., Disp: , Rfl:  .  oxyCODONE (ROXICODONE) 15 MG immediate release tablet, Take 15 mg by mouth every 6 (six) hours. , Disp: , Rfl:  .  pantoprazole (PROTONIX) 40 MG tablet, Take 40 mg by mouth 2 (two) times daily., Disp: , Rfl:  .  TECFIDERA 240 MG CPDR, Take 1 capsule by mouth 2 (two) times daily., Disp: , Rfl:  .  tiZANidine (ZANAFLEX) 2 MG tablet, Take 2 mg by mouth 2 (two) times daily., Disp: , Rfl:  .  VIBERZI 100 MG TABS, Take 1 tablet by mouth 2 (two) times daily., Disp: , Rfl:  .  vitamin B-12 (CYANOCOBALAMIN) 1000 MCG tablet, Take 1,000 mcg by mouth daily. , Disp: , Rfl:  .  XIIDRA 5 % SOLN, Place 1 drop into both eyes in the morning and at bedtime. , Disp: , Rfl:  .  zolpidem (AMBIEN) 10 MG tablet, Take 10 mg by mouth at bedtime. , Disp: , Rfl:  .  acetaminophen (TYLENOL) 325 MG tablet, Take 650 mg by  mouth 3 (three) times daily as needed for moderate pain. (Patient not taking: No sig reported), Disp: , Rfl:  .  lamoTRIgine (LAMICTAL) 200 MG tablet, One po twicw daily, Disp: 60 tablet, Rfl: 11 .  omeprazole (PRILOSEC) 20 MG capsule, Take 1 capsule (20 mg total) by mouth daily. (Patient not taking: No sig reported), Disp: 90 capsule, Rfl: 3  PAST MEDICAL HISTORY: Past Medical History:  Diagnosis Date  . Diabetes mellitus   . Hypertension   . Multiple sclerosis exacerbation ([place])     PAST SURGICAL HISTORY: Past Surgical History:  Procedure Laterality Date  . ABDOMINAL HYSTERECTOMY     partial  . JOINT REPLACEMENT     LTK  . TUBAL LIGATION      FAMILY HISTORY: Family History  Problem Relation Age of Onset  . Diabetes Mother   . Hypertension Mother   . Stroke Father     SOCIAL HISTORY:  Social History   Socioeconomic History  . Marital status: Single    Spouse name: Not on file  . Number of children: 2  . Years of education: Not on file  . Highest education level: Not on file  Occupational History  . Not on file  Tobacco Use  . Smoking status: Current Every Day Smoker    Packs/day: 0.25    Years: 12.00    Pack years: 3.00    Types: Cigarettes  . Smokeless tobacco: Never Used  . Tobacco comment: 06/13/20 1 pk per 3 days  Substance and Sexual Activity  . Alcohol use: No  . Drug use: No  . Sexual activity: Not on file  Other Topics Concern  . Not on file  Social History Narrative   06/13/20 lives alone, dgtr across the street   Social Determinants of Health   Financial Resource Strain: Not on file  Food Insecurity: Not on file  Transportation Needs: Not on file  Physical Activity: Not on file  Stress: Not on file  Social Connections: Not on file  Intimate Partner Violence: Not on  file     PHYSICAL EXAM  Vitals:   06/13/20 0745  BP: 133/77  Pulse: (!) 104  Weight: 286 lb 8 oz (130 kg)  Height: 5' 3"  (1.6 m)    Body mass index is 50.75  kg/m.   General: The patient is well-developed and well-nourished and in no acute distress  HEENT:  Head is [place]/AT.  Sclera are anicteric.     Skin: Extremities are without rash.  She has some edema at the ankles.  Musculoskeletal: She reports tenderness over the paraspinal muscles of the neck and lower back and myalgias at typical fibromyalgia tender points.  Neurologic Exam  Mental status: The patient is alert and oriented x 3 at the time of the examination. The patient has apparent normal recent and remote memory, with an apparently normal attention span and concentration ability.   Speech is normal.  Cranial nerves: Extraocular movements are full.  Facial strength and sensation was normal.  No obvious hearing deficits are noted.  Motor:  Muscle bulk is normal.   Tone is normal. Strength is  5 / 5 in all 4 extremities except 4+/5 ankles,4/5 toes, slightly worse on left. .   Sensory: Sensory testing is intact to pinprick, soft touch and vibration sensation in the arms and proximal legs but decreased sensation in toes and ankles to vibration.  Coordination: Cerebellar testing reveals good finger-nose-finger and heel-to-shin bilaterally.  Gait and station: Station is normal.   Gait is arthritic. Tandem gait is wide. Romberg is negative.   Reflexes: Deep tendon reflexes are 1 and symmetric in the limbs.   Plantar responses are flexor.    DIAGNOSTIC DATA (LABS, IMAGING, TESTING) - I reviewed patient records, labs, notes, testing and imaging myself where available.  Lab Results  Component Value Date   WBC 8.9 12/07/2019   HGB 14.4 12/07/2019   HCT 41.9 12/07/2019   MCV 86 12/07/2019   PLT 277 12/07/2019      Component Value Date/Time   NA 142 11/17/2019 1242   K 3.3 (L) 11/17/2019 1242   CL 107 11/17/2019 1242   CO2 24 11/17/2019 1242   GLUCOSE 81 11/17/2019 1242   BUN 11 11/17/2019 1242   CREATININE 0.92 11/17/2019 1242   CREATININE 0.85 02/24/2012 1044   CALCIUM 9.5  11/17/2019 1242   PROT 6.7 03/01/2012 1653   ALBUMIN 3.4 (L) 03/01/2012 1653   AST 26 03/01/2012 1653   ALT 24 03/01/2012 1653   ALKPHOS 98 03/01/2012 1653   BILITOT 0.7 03/01/2012 1653   GFRNONAA >60 11/17/2019 1242   GFRAA >60 11/17/2019 1242   Lab Results  Component Value Date   CHOL (H) 02/19/2009    237        ATP III CLASSIFICATION:  <200     mg/dL   Desirable  200-239  mg/dL   Borderline High  >=240    mg/dL   High          HDL 52 02/19/2009   LDLCALC (H) 02/19/2009    135        Total Cholesterol/HDL:CHD Risk Coronary Heart Disease Risk Table                     Men   Women  1/2 Average Risk   3.4   3.3  Average Risk       5.0   4.4  2 X Average Risk   9.6   7.1  3 X Average Risk  23.4  11.0        Use the calculated Patient Ratio above and the CHD Risk Table to determine the patient's CHD Risk.        ATP III CLASSIFICATION (LDL):  <100     mg/dL   Optimal  100-129  mg/dL   Near or Above                    Optimal  130-159  mg/dL   Borderline  160-189  mg/dL   High  >190     mg/dL   Very High   TRIG 249 (H) 02/19/2009   CHOLHDL 4.6 02/19/2009   Lab Results  Component Value Date   HGBA1C 5.3 03/01/2012   Lab Results  Component Value Date   VITAMINB12 711 11/06/2008   Lab Results  Component Value Date   TSH 2.566 02/24/2012       ASSESSMENT AND PLAN  Multiple sclerosis ([place]) - Plan: CBC with Differential/Platelet  High risk medication use - Plan: CBC with Differential/Platelet  Lumbar spondylosis  Other chronic pain  Depression with anxiety  Gait disturbance  1.   Continue Tecfidra.  Check CBC with differential.    Since stable, can wait 2 years in between MRI (2023) 2.   She sees pain management and is on oxycodone and Nucynta.  She also sees the spine center for injections.  She never had migraine headaches are not sure why she is on Topamax.  In my experience it has not really helped her neuropathic type pain as much.  Therefore,  we will stop the Topamax and increase the lamotrigine in the hope that that helped some of her neuropathic/dysesthetic pain more.  It is less likely to help the musculoskeletal/spine pain 3.    Encourage weight loss.  She tries to be active and should try to increase his activity further.   4.    Return in 6 months or sooner if there are new or worsening neurologic symptoms.  Payton Moder A. Felecia Shelling, MD, [facility] 07/31/1759, 6:07 AM Certified in Neurology, Clinical Neurophysiology, Sleep Medicine and Neuroimaging  [facility] [address] [place], [place] [address] [phone]

## 2020-06-13 NOTE — Telephone Encounter (Signed)
Pt states she will call us back to schedule her 6 month f/u.

## 2020-06-14 LAB — CBC WITH DIFFERENTIAL/PLATELET
Basophils Absolute: 0 10*3/uL (ref 0.0–0.2)
Basos: 0 %
EOS (ABSOLUTE): 0 10*3/uL (ref 0.0–0.4)
Eos: 0 %
Hematocrit: 42 % (ref 34.0–46.6)
Hemoglobin: 14.4 g/dL (ref 11.1–15.9)
Immature Grans (Abs): 0.2 10*3/uL — ABNORMAL HIGH (ref 0.0–0.1)
Immature Granulocytes: 1 %
Lymphocytes Absolute: 2.3 10*3/uL (ref 0.7–3.1)
Lymphs: 17 %
MCH: 30.3 pg (ref 26.6–33.0)
MCHC: 34.3 g/dL (ref 31.5–35.7)
MCV: 88 fL (ref 79–97)
Monocytes Absolute: 1 10*3/uL — ABNORMAL HIGH (ref 0.1–0.9)
Monocytes: 7 %
Neutrophils Absolute: 10.2 10*3/uL — ABNORMAL HIGH (ref 1.4–7.0)
Neutrophils: 75 %
Platelets: 296 10*3/uL (ref 150–450)
RBC: 4.76 x10E6/uL (ref 3.77–5.28)
RDW: 12.8 % (ref 11.7–15.4)
WBC: 13.6 10*3/uL — ABNORMAL HIGH (ref 3.4–10.8)

## 2020-07-08 ENCOUNTER — Telehealth: Payer: Self-pay | Admitting: Neurology

## 2020-07-08 NOTE — Telephone Encounter (Signed)
Pt called wanting to speak to the RN regarding some symptoms she has developed in the last 3 days due to her MS.  Pt states she has been having neck pain headaches, shakes, pain in lower back , blurry vision and has been off balance and it's getting worse everyday. Pt requested for this message to be put in as urgent. Please advise.

## 2020-07-08 NOTE — Telephone Encounter (Signed)
FYI- Called pt. Offered work in appt tomorrow morning at 8am with Dr. Epimenio Foot. She declined, she already has two appt tomorrow. Advised Dr. Epimenio Foot off the rest of the week. Scheduled work in appt for 07/15/20 at 3pm with Dr. Epimenio Foot. She will proceed to ED if sx worsen/become severe. She has appt w/ Vein/vascular specialist tomorrow d/t severe swelling in one leg. She will call back if she needs anything further prior to appt.

## 2020-07-11 ENCOUNTER — Encounter (INDEPENDENT_AMBULATORY_CARE_PROVIDER_SITE_OTHER): Payer: Self-pay

## 2020-07-15 ENCOUNTER — Encounter: Payer: Self-pay | Admitting: Neurology

## 2020-07-15 ENCOUNTER — Ambulatory Visit (INDEPENDENT_AMBULATORY_CARE_PROVIDER_SITE_OTHER): Payer: Medicare Other | Admitting: Neurology

## 2020-07-15 VITALS — BP 169/91 | HR 108 | Ht 63.0 in | Wt 282.5 lb

## 2020-07-15 DIAGNOSIS — G8929 Other chronic pain: Secondary | ICD-10-CM

## 2020-07-15 DIAGNOSIS — R269 Unspecified abnormalities of gait and mobility: Secondary | ICD-10-CM | POA: Diagnosis not present

## 2020-07-15 DIAGNOSIS — M47816 Spondylosis without myelopathy or radiculopathy, lumbar region: Secondary | ICD-10-CM | POA: Diagnosis not present

## 2020-07-15 DIAGNOSIS — G35 Multiple sclerosis: Secondary | ICD-10-CM | POA: Diagnosis not present

## 2020-07-15 DIAGNOSIS — G250 Essential tremor: Secondary | ICD-10-CM | POA: Insufficient documentation

## 2020-07-15 NOTE — Progress Notes (Addendum)
[facility]  PATIENT: [name] DOB: 1960-07-30  REFERRING DOCTOR OR PCP: Lucky Cowboy, FNP SOURCE: Patient, notes from primary care, imaging and lab reports, MRI images personally reviewed.  _________________________________   HISTORICAL  CHIEF COMPLAINT:  Chief Complaint  Patient presents with  . Follow-up    RM 13, alone. Last seen 06/13/20. On Tecfidera for MS. Having neck pain/headache, shakes, pain in lower back, blurry vision, balance issues. Ambulating with cane, no falls.    HISTORY OF PRESENT ILLNESS:  [name] is a 60 y.o. woman with multiple sclerosis, LBP and gait disturbance   Update 07/15/2020: She feels more off balanced ad notes more trembling in her hands.  Balance is reduced and she uses a cane for safety,especially outside.  She stumbles and her legs sometimes give out.   No severe falls.   She reports a lot of burning dysesthetic pain in her arms and legs. She was on gabapentin but it was recently stopped as it seemed to stop working for her.   She is on lamotrigine as well for pain.   Due to her reduced gait, she is having more trouble doing ADLs (going room to room for meal preparation, bathroom, minor chores).    She currently uses a cane but it is not helping enough.    A walker is unlikely to help her do ADL's better than the cane.    She cannot self-propel a wheelchair.   Therefore a scooter would best allow her to do her ADLs.    She has the ability  To effectively operate the tillr steering system and can transfer on/off the scooter independently.  She can maintain postural stability.   For MS, she is on Tecficdera and tolerates it well.   She has no exacerbations but has done worse.     She also has LBP and sees Dr. Christoper Fabian for pain management and is on Nucynta and oxycodone.  She also sees [facility] for injeciotns   MBBB/RFA helped the right side but not the left side.   She is also on tizanidine,  baclofen and amitriptyline.   She reports difficulty with vision and has needed a new pair of glasses every year recently.    She has urinary urgency and frequency.   She has a bladder stimulator (Interstim) but she can't charge it.  She sees [facility].    She reports a lot of insomnia with problems with both sleep onset and maintenance.   Even with her pain medications and Ambien, sleep is poor (4-6 hours many nights -- falls asleep well but then wakes up).    She sees [facility] Management.   She is on oxycodone, Nucynta and zolpidem.  She is also on diphenoxylate for bowel issues.   She also had recent ESI at a spine center (Dr. Maia Petties)   [place] She was diagnosed with MS in 2008 after presenting with leg weakness and headache, neck pain, back pain.  MRIs showed foci in her cervical spine and brain.   She was started on Rebif.   Initially she did well but she reports having a lot of relapses.   She was switched to Copaxone but also had more symptoms.   She was then switched to Tysabri for several years but reports having several relapses.   She switched to Tecfidera 5 years ago.   She reports having a flare 3 years ago and had 5 days of IV Solu-medrol.  July 2021, she was complaining of blurry vision, stiff neck and increased burning and tingling a few weeks ago.  She went to the ED and had MRIs performed showing no new lesions.    IMAGING: MRI of the cervical spine 11/18/2019 and 03/02/2012 showed two T2 hyperintense foci adjacent to C2 and C3-C4.  There was mild multilevel degenerative changes as well.    MRI of the brain 11/18/2019 and 03/02/2020 show a fairly low plaque burden of T2/FLAIR hyperintense foci.  Most of them are nonspecific.  A few are periventricular.  None acute and no change over time.  REVIEW OF SYSTEMS: Constitutional: No fevers, chills, sweats, or change in appetite.  She has fatigue and she reports insomnia Eyes: No visual changes, double vision,  eye pain Ear, nose and throat: No hearing loss, ear pain, nasal congestion, sore throat Cardiovascular: No chest pain, palpitations Respiratory: No shortness of breath at rest or with exertion.   No wheezes GastrointestinaI: No nausea, vomiting, diarrhea, abdominal pain, fecal incontinence Genitourinary: No dysuria, urinary retention or frequency.  No nocturia. Musculoskeletal:Pain all over in muscles, joints and spine Integumentary: No rash, pruritus, skin lesions Neurological: as above Psychiatric: She has depression and anxiety Endocrine: No palpitations, diaphoresis, change in appetite, change in weigh or increased thirst Hematologic/Lymphatic: No anemia, purpura, petechiae. Allergic/Immunologic: No itchy/runny eyes, nasal congestion, recent allergic reactions, rashes  ALLERGIES: No Known Allergies  HOME MEDICATIONS:  Current Outpatient Medications:  .  acetaminophen (TYLENOL) 325 MG tablet, Take 650 mg by mouth 3 (three) times daily as needed for moderate pain., Disp: , Rfl:  .  amitriptyline (ELAVIL) 25 MG tablet, Take 25 mg by mouth at bedtime., Disp: , Rfl:  .  ANTI-DIARRHEAL 2 MG tablet, Take 2 mg by mouth as needed for diarrhea or loose stools. , Disp: , Rfl:  .  atorvastatin (LIPITOR) 80 MG tablet, Take 1 tablet (80 mg total) by mouth daily. (Patient taking differently: Take 80 mg by mouth every evening.), Disp: 90 tablet, Rfl: 3 .  baclofen (LIORESAL) 20 MG tablet, Take 20 mg by mouth 2 (two) times daily. , Disp: , Rfl:  .  BIOTIN PO, Take by mouth., Disp: , Rfl:  .  busPIRone (BUSPAR) 15 MG tablet, Take 15 mg by mouth 3 (three) times daily., Disp: , Rfl:  .  clotrimazole (MYCELEX) 10 MG troche, Take 10 mg by mouth 3 (three) times daily as needed (thrush/vaginal yeast infections). , Disp: , Rfl:  .  dicyclomine (BENTYL) 20 MG tablet, Take 40 mg by mouth in the morning and at bedtime. , Disp: , Rfl:  .  diphenoxylate-atropine (LOMOTIL) 2.5-0.025 MG per tablet, Take 1  tablet by mouth 2 (two) times daily. PATIENT NEEDS OFFICE VISIT FOR ADDITIONAL REFILLS (Patient taking differently: Take 1 tablet by mouth in the morning, at noon, and at bedtime. PATIENT NEEDS OFFICE VISIT FOR ADDITIONAL REFILLS), Disp: 60 tablet, Rfl: 0 .  DULoxetine (CYMBALTA) 60 MG capsule, Take 60 mg by mouth 2 (two) times daily. , Disp: , Rfl:  .  Echinacea 400 MG CAPS, Take 400 mg by mouth in the morning and at bedtime., Disp: , Rfl:  .  fluticasone (FLONASE) 50 MCG/ACT nasal spray, Place 2 sprays into the nose daily. (Patient taking differently: Place 1 spray into the nose in the morning and at bedtime.), Disp: 16 g, Rfl: 11 .  Homeopathic Products (LEG CRAMPS PO), Take 1-2 tablets by mouth at bedtime., Disp: , Rfl:  .  ibuprofen (ADVIL) 800 MG  tablet, Take 800 mg by mouth 3 (three) times daily as needed for moderate pain. , Disp: , Rfl:  .  lamoTRIgine (LAMICTAL) 200 MG tablet, One po twicw daily, Disp: 60 tablet, Rfl: 11 .  losartan (COZAAR) 100 MG tablet, Take 1 tablet (100 mg total) by mouth daily. (Patient taking differently: Take 100 mg by mouth every evening.), Disp: 90 tablet, Rfl: 3 .  MAGNESIUM-OXIDE 400 (241.3 Mg) MG tablet, Take 1 tablet by mouth daily., Disp: , Rfl:  .  MELATONIN MAXIMUM STRENGTH 5 MG TABS, Take 15 mg by mouth at bedtime., Disp: , Rfl:  .  montelukast (SINGULAIR) 10 MG tablet, Take 10 mg by mouth daily., Disp: , Rfl:  .  MUCUS RELIEF 400 MG TABS tablet, Take 400 mg by mouth 2 (two) times daily., Disp: , Rfl:  .  Multiple Vitamins-Calcium (ONE-A-DAY WOMENS FORMULA) TABS, Take 1 tablet by mouth daily., Disp: , Rfl:  .  Multiple Vitamins-Minerals (AIRBORNE) CHEW, Chew 3 tablets by mouth daily. , Disp: , Rfl:  .  NARCAN 4 MG/0.1ML LIQD nasal spray kit, Place 1 spray into the nose as directed. overdose, Disp: , Rfl:  .  NIFEdipine (PROCARDIA-XL/ADALAT-CC/NIFEDICAL-XL) 30 MG 24 hr tablet, Take 30 mg by mouth daily., Disp: , Rfl:  .  NUCYNTA ER 200 MG TB12, Take 1  tablet by mouth 2 (two) times daily., Disp: , Rfl:  .  omeprazole (PRILOSEC) 20 MG capsule, Take 1 capsule (20 mg total) by mouth daily., Disp: 90 capsule, Rfl: 3 .  oxyCODONE (ROXICODONE) 15 MG immediate release tablet, Take 15 mg by mouth every 6 (six) hours. , Disp: , Rfl:  .  pantoprazole (PROTONIX) 40 MG tablet, Take 40 mg by mouth 2 (two) times daily., Disp: , Rfl:  .  TECFIDERA 240 MG CPDR, Take 1 capsule by mouth 2 (two) times daily., Disp: , Rfl:  .  tiZANidine (ZANAFLEX) 2 MG tablet, Take 2 mg by mouth 2 (two) times daily., Disp: , Rfl:  .  VIBERZI 100 MG TABS, Take 1 tablet by mouth 2 (two) times daily., Disp: , Rfl:  .  vitamin B-12 (CYANOCOBALAMIN) 1000 MCG tablet, Take 1,000 mcg by mouth daily. , Disp: , Rfl:  .  XIIDRA 5 % SOLN, Place 1 drop into both eyes in the morning and at bedtime. , Disp: , Rfl:  .  zolpidem (AMBIEN) 10 MG tablet, Take 10 mg by mouth at bedtime. , Disp: , Rfl:   PAST MEDICAL HISTORY: Past Medical History:  Diagnosis Date  . Diabetes mellitus   . Hypertension   . Multiple sclerosis exacerbation ([place])     PAST SURGICAL HISTORY: Past Surgical History:  Procedure Laterality Date  . ABDOMINAL HYSTERECTOMY     partial  . JOINT REPLACEMENT     LTK  . TUBAL LIGATION      FAMILY HISTORY: Family History  Problem Relation Age of Onset  . Diabetes Mother   . Hypertension Mother   . Stroke Father     SOCIAL HISTORY:  Social History   Socioeconomic History  . Marital status: Single    Spouse name: Not on file  . Number of children: 2  . Years of education: Not on file  . Highest education level: Not on file  Occupational History  . Not on file  Tobacco Use  . Smoking status: Current Every Day Smoker    Packs/day: 0.25    Years: 12.00    Pack years: 3.00    Types: Cigarettes  .  Smokeless tobacco: Never Used  . Tobacco comment: 06/13/20 1 pk per 3 days  Substance and Sexual Activity  . Alcohol use: No  . Drug use: No  . Sexual activity:  Not on file  Other Topics Concern  . Not on file  Social History Narrative   06/13/20 lives alone, dgtr across the street   Social Determinants of Health   Financial Resource Strain: Not on file  Food Insecurity: Not on file  Transportation Needs: Not on file  Physical Activity: Not on file  Stress: Not on file  Social Connections: Not on file  Intimate Partner Violence: Not on file     PHYSICAL EXAM  Vitals:   07/15/20 1505  BP: (!) 169/91  Pulse: (!) 108  Weight: 282 lb 8 oz (128.1 kg)  Height: 5' 3"  (1.6 m)    Body mass index is 50.04 kg/m.   General: The patient is well-developed and well-nourished and in no acute distress  HEENT:  Head is [place]/AT.  Sclera are anicteric.     Skin: Extremities are without rash.  She has some edema at the ankles.  Musculoskeletal: She reports tenderness over the paraspinal muscles of the neck and lower back and myalgias at typical fibromyalgia tender points.  Neurologic Exam  Mental status: The patient is alert and oriented x 3 at the time of the examination. The patient has apparent normal recent and remote memory, with an apparently normal attention span and concentration ability.   Speech is normal.  Cranial nerves: Extraocular movements are full.  Facial strength and sensation was normal.  No obvious hearing deficits are noted.  Motor:  Muscle bulk is normal.   Tone is normal. Strength is  5 / 5 in all 4 extremities except 4+/5 ankles,4/5 toes, slightly worse on left. .   Sensory: Sensory testing is reduced in her left arm and reduced touch/temp in left leg though vibration is symmetric in legs.   Also, decreased sensation in toes and ankles to vibration compared to knees  Coordination: Cerebellar testing reveals good finger-nose-finger and heel-to-shin bilaterally.  Gait and station: Station is normal.   Gait is arthritic. Tandem gait is wide. Romberg is negative.   Reflexes: Deep tendon reflexes are 1 and symmetric in the  limbs.       DIAGNOSTIC DATA (LABS, IMAGING, TESTING) - I reviewed patient records, labs, notes, testing and imaging myself where available.  Lab Results  Component Value Date   WBC 13.6 (H) 06/13/2020   HGB 14.4 06/13/2020   HCT 42.0 06/13/2020   MCV 88 06/13/2020   PLT 296 06/13/2020      Component Value Date/Time   NA 142 11/17/2019 1242   K 3.3 (L) 11/17/2019 1242   CL 107 11/17/2019 1242   CO2 24 11/17/2019 1242   GLUCOSE 81 11/17/2019 1242   BUN 11 11/17/2019 1242   CREATININE 0.92 11/17/2019 1242   CREATININE 0.85 02/24/2012 1044   CALCIUM 9.5 11/17/2019 1242   PROT 6.7 03/01/2012 1653   ALBUMIN 3.4 (L) 03/01/2012 1653   AST 26 03/01/2012 1653   ALT 24 03/01/2012 1653   ALKPHOS 98 03/01/2012 1653   BILITOT 0.7 03/01/2012 1653   GFRNONAA >60 11/17/2019 1242   GFRAA >60 11/17/2019 1242   Lab Results  Component Value Date   CHOL (H) 02/19/2009    237        ATP III CLASSIFICATION:  <200     mg/dL   Desirable  200-239  mg/dL  Borderline High  >=240    mg/dL   High          HDL 52 02/19/2009   LDLCALC (H) 02/19/2009    135        Total Cholesterol/HDL:CHD Risk Coronary Heart Disease Risk Table                     Men   Women  1/2 Average Risk   3.4   3.3  Average Risk       5.0   4.4  2 X Average Risk   9.6   7.1  3 X Average Risk  23.4   11.0        Use the calculated Patient Ratio above and the CHD Risk Table to determine the patient's CHD Risk.        ATP III CLASSIFICATION (LDL):  <100     mg/dL   Optimal  100-129  mg/dL   Near or Above                    Optimal  130-159  mg/dL   Borderline  160-189  mg/dL   High  >190     mg/dL   Very High   TRIG 249 (H) 02/19/2009   CHOLHDL 4.6 02/19/2009   Lab Results  Component Value Date   HGBA1C 5.3 03/01/2012   Lab Results  Component Value Date   VITAMINB12 711 11/06/2008   Lab Results  Component Value Date   TSH 2.566 02/24/2012       ASSESSMENT AND PLAN  Multiple sclerosis  (HCC)  Gait disturbance  Lumbar spondylosis  Other chronic pain   1.    Transport planner for mobility needs.    See details above 2.    She sees pain management and is on oxycodone and Nucynta.  She also sees the spine center for injections.  For dysesthesias, will continue lamotrigine,.   She will continue Tecfidera for MS 3.    Encouraged weight loss.  She tries to be active and should try to increase his activity further.   4.    Return in 6 months or sooner if there are new or worsening neurologic symptoms.  45-minute office visit with the majority of the time spent face-to-face for history and physical, discussion/counseling and decision-making.  Additional time with record review and documentation.  Blimi Godby A. Felecia Shelling, MD, [facility] 3/68/5992, 3:41 PM Certified in Neurology, Clinical Neurophysiology, Sleep Medicine and Neuroimaging  [facility] [address], [place], [place] [address] [phone]

## 2020-07-22 ENCOUNTER — Telehealth: Payer: Self-pay | Admitting: Neurology

## 2020-07-22 NOTE — Telephone Encounter (Signed)
Called pt back. Advised we did receive papework from [employer] today. We are currently working on this for her. Once complete, we will send back. She verbalized understanding.

## 2020-07-22 NOTE — Telephone Encounter (Signed)
Pt called and LVM stating that she is needing a PA for her to be able to get her scooter. Please advise.

## 2020-07-23 NOTE — Telephone Encounter (Signed)
Faxed printed/signed orders/OV notes for electric scooter to [employer] at [phone]. Received fax confirmation.

## 2020-09-23 DIAGNOSIS — K219 Gastro-esophageal reflux disease without esophagitis: Secondary | ICD-10-CM | POA: Insufficient documentation

## 2020-09-30 ENCOUNTER — Other Ambulatory Visit: Payer: Self-pay | Admitting: Neurology

## 2020-10-07 ENCOUNTER — Telehealth: Payer: Self-pay | Admitting: Neurology

## 2020-10-07 MED ORDER — AMITRIPTYLINE HCL 25 MG PO TABS: 25.0000 mg | ORAL_TABLET | Freq: Every day | ORAL | 5 refills | Status: DC

## 2020-10-07 NOTE — Telephone Encounter (Signed)
Pt called needing a refill on her amitriptyline (ELAVIL) 25 MG tablet as soon as possible due to her going out of town this Thursday. Please advise.

## 2020-10-07 NOTE — Telephone Encounter (Signed)
MD approved refill, e-scribed to pharmacy.

## 2020-10-07 NOTE — Telephone Encounter (Signed)
Called patient back. States MD rx'd this over a yr ago. Needing refill. Taking 25mg  po qhs. Would like it called into [id number] - [place], [place] - [address] AT [facility] [address] [facility] [address]  She is seeing cardiology tomorrow for ongoing left leg swelling x4 months. She will call back to make an earlier follow up with [facility] if needed. She is currently scheduled for 01/14/21 at 3pm

## 2020-10-21 ENCOUNTER — Telehealth: Payer: Self-pay | Admitting: Neurology

## 2020-10-21 NOTE — Telephone Encounter (Signed)
Called pt back. Reports her neck/shoulder are hurting.L leg swollen from toes to her knee. Last couple days, it has gone into her thigh. She is falling, stumbling, off balance. She previously saw cardiologist, swelling not coming from her heart. Has [place] of left leg 10/23/20. She could hardly walk yesterday. She also has appt 10/23/20 at 11:30am with Dr. Kathrynn Running to go over colonoscopy results. Scheduled work in visit for 10/23/20 at 4pm with Dr. Epimenio Foot. She will get an uber to appt. Advised this was the only available appt this week w/ MD. Pt feels she is having MS relapse and wanted to come in to see Dr. Epimenio Foot to be evaluated.

## 2020-10-21 NOTE — Telephone Encounter (Signed)
Pt called and LVM stating that she thinks she is having a MS Relapse and she is needing to speak to the RN. Please advise.

## 2020-10-23 ENCOUNTER — Encounter: Payer: Self-pay | Admitting: Neurology

## 2020-10-23 ENCOUNTER — Ambulatory Visit (INDEPENDENT_AMBULATORY_CARE_PROVIDER_SITE_OTHER): Payer: Medicare Other | Admitting: Neurology

## 2020-10-23 VITALS — BP 196/96 | HR 115 | Ht 63.0 in | Wt 287.0 lb

## 2020-10-23 DIAGNOSIS — G35 Multiple sclerosis: Secondary | ICD-10-CM

## 2020-10-23 DIAGNOSIS — G8929 Other chronic pain: Secondary | ICD-10-CM | POA: Diagnosis not present

## 2020-10-23 DIAGNOSIS — R269 Unspecified abnormalities of gait and mobility: Secondary | ICD-10-CM

## 2020-10-23 DIAGNOSIS — Z79899 Other long term (current) drug therapy: Secondary | ICD-10-CM

## 2020-10-23 NOTE — Progress Notes (Signed)
[facility]  PATIENT: [name] DOB: 05-20-1960  REFERRING DOCTOR OR PCP: Lucky Cowboy, FNP SOURCE: Patient, notes from primary care, imaging and lab reports, MRI images personally reviewed.  _________________________________   HISTORICAL  CHIEF COMPLAINT:  Chief Complaint  Patient presents with  . Follow-up    RM 12, alone. Thinks she may be having MS exacerbation. Last seen 07/15/2020. On Tecfidera for MS. Had 8 polyps found on colonoscopy. Smallest cancerous and was removed, others ok. Still waiting on results of [place] L left. Ambulates w/ cane.     HISTORY OF PRESENT ILLNESS:  [name] is a 60 y.o. woman with multiple sclerosis, LBP and gait disturbance  Update 10/23/2020: She is noting more pain in her legs and more swelling.    She feels weaker.  She notes more fatigue.  For MS, she is on Tecficdera and tolerates it well.    She is using a cane due to reduced gait but feels her gait is doing worse.  She is fatiguing more rapidly.   She is waiting to get her electric wheelchair.   She stumbles and her legs sometimes give out.     She reports a lot of burning dysesthetic pain in her arms and legs.   She is on lamotrigine as well for pain.    Gabapentin stopped helping and she stopped.   She is having some difficulty doing activities of daily living.  She has difficulty with vision but feels her vision is symmetric.  She has urinary urgency and frequency.  She also has LBP and sees Dr. Christoper Fabian for pain management and is on Nucynta and oxycodone 15 mg q6  She had MBBB/RFA helped the right side but not the left side.  Surgery has been discussed with her.   She is also on tizanidine, baclofen and amitriptyline to help her pain.   She sees Dr. Maia Petties.     She reports a lot of insomnia with problems sleep maintenance issues more than sleep onset.  This is worse since she ran out of her Ambien.  Even with her pain medications and Ambien, sleep is poor  (4-6 hours many nights -- falls asleep well but then wakes up).    She sees [facility].   She is on oxycodone, Nucynta and zolpidem.    She reports the colonoscopy went well and had 8 polyps including one felt to be cancerous  MS HISTORY She was diagnosed with MS in 2008 after presenting with leg weakness and headache, neck pain, back pain.  MRIs showed foci in her cervical spine and brain.   She was started on Rebif.   Initially she did well but she reports having a lot of relapses.   She was switched to Copaxone but also had more symptoms.   She was then switched to Tysabri for several years but reports having several relapses.   She switched to Tecfidera 5 years ago.   She reports having a flare in 2015 and had 5 days of IV Solu-medrol.   July 2021, she was complaining of blurry vision, stiff neck and increased burning and tingling a few weeks ago.  She went to the ED and had MRIs performed showing no new lesions.    IMAGING: MRI of the cervical spine 11/18/2019 and 03/02/2012 showed two T2 hyperintense foci adjacent to C2 and C3-C4.  There was mild multilevel degenerative changes as well.    MRI of the brain 11/18/2019  and 03/02/2020 show a fairly low plaque burden of T2/FLAIR hyperintense foci.  Most of them are nonspecific.  A few are periventricular.  None acute and no change over time.  REVIEW OF SYSTEMS: Constitutional: No fevers, chills, sweats, or change in appetite.  She has fatigue and she reports insomnia Eyes: No visual changes, double vision, eye pain Ear, nose and throat: No hearing loss, ear pain, nasal congestion, sore throat Cardiovascular: No chest pain, palpitations Respiratory: No shortness of breath at rest or with exertion.   No wheezes GastrointestinaI: No nausea, vomiting, diarrhea, abdominal pain, fecal incontinence Genitourinary: No dysuria, urinary retention or frequency.  No nocturia. Musculoskeletal:Pain all over in muscles, joints  and spine Integumentary: No rash, pruritus, skin lesions Neurological: as above Psychiatric: She has depression and anxiety Endocrine: No palpitations, diaphoresis, change in appetite, change in weigh or increased thirst Hematologic/Lymphatic: No anemia, purpura, petechiae. Allergic/Immunologic: No itchy/runny eyes, nasal congestion, recent allergic reactions, rashes  ALLERGIES: No Known Allergies  HOME MEDICATIONS:  Current Outpatient Medications:  .  acetaminophen (TYLENOL) 325 MG tablet, Take 650 mg by mouth 3 (three) times daily as needed for moderate pain., Disp: , Rfl:  .  amitriptyline (ELAVIL) 25 MG tablet, Take 1 tablet (25 mg total) by mouth at bedtime., Disp: 30 tablet, Rfl: 5 .  ANTI-DIARRHEAL 2 MG tablet, Take 2 mg by mouth as needed for diarrhea or loose stools. , Disp: , Rfl:  .  atorvastatin (LIPITOR) 80 MG tablet, Take 1 tablet (80 mg total) by mouth daily. (Patient taking differently: Take 80 mg by mouth every evening.), Disp: 90 tablet, Rfl: 3 .  baclofen (LIORESAL) 20 MG tablet, Take 20 mg by mouth 2 (two) times daily. , Disp: , Rfl:  .  BIOTIN PO, Take by mouth., Disp: , Rfl:  .  busPIRone (BUSPAR) 15 MG tablet, Take 15 mg by mouth 3 (three) times daily., Disp: , Rfl:  .  clotrimazole (MYCELEX) 10 MG troche, Take 10 mg by mouth 3 (three) times daily as needed (thrush/vaginal yeast infections). , Disp: , Rfl:  .  dicyclomine (BENTYL) 20 MG tablet, Take 40 mg by mouth in the morning and at bedtime. , Disp: , Rfl:  .  diphenoxylate-atropine (LOMOTIL) 2.5-0.025 MG per tablet, Take 1 tablet by mouth 2 (two) times daily. PATIENT NEEDS OFFICE VISIT FOR ADDITIONAL REFILLS (Patient taking differently: Take 1 tablet by mouth in the morning, at noon, and at bedtime. PATIENT NEEDS OFFICE VISIT FOR ADDITIONAL REFILLS), Disp: 60 tablet, Rfl: 0 .  DULoxetine (CYMBALTA) 60 MG capsule, Take 60 mg by mouth 2 (two) times daily. , Disp: , Rfl:  .  Echinacea 400 MG CAPS, Take 400 mg by  mouth in the morning and at bedtime., Disp: , Rfl:  .  fluticasone (FLONASE) 50 MCG/ACT nasal spray, Place 2 sprays into the nose daily. (Patient taking differently: Place 1 spray into the nose in the morning and at bedtime.), Disp: 16 g, Rfl: 11 .  Homeopathic Products (LEG CRAMPS PO), Take 1-2 tablets by mouth at bedtime., Disp: , Rfl:  .  ibuprofen (ADVIL) 800 MG tablet, Take 800 mg by mouth 3 (three) times daily as needed for moderate pain. , Disp: , Rfl:  .  lamoTRIgine (LAMICTAL) 200 MG tablet, One po twicw daily, Disp: 60 tablet, Rfl: 11 .  losartan (COZAAR) 100 MG tablet, Take 1 tablet (100 mg total) by mouth daily. (Patient taking differently: Take 100 mg by mouth every evening.), Disp: 90 tablet, Rfl: 3 .  MAGNESIUM-OXIDE 400 (241.3 Mg) MG tablet, Take 1 tablet by mouth daily., Disp: , Rfl:  .  MELATONIN MAXIMUM STRENGTH 5 MG TABS, Take 15 mg by mouth at bedtime., Disp: , Rfl:  .  montelukast (SINGULAIR) 10 MG tablet, Take 10 mg by mouth daily., Disp: , Rfl:  .  MUCUS RELIEF 400 MG TABS tablet, Take 400 mg by mouth 2 (two) times daily., Disp: , Rfl:  .  Multiple Vitamins-Calcium (ONE-A-DAY WOMENS FORMULA) TABS, Take 1 tablet by mouth daily., Disp: , Rfl:  .  Multiple Vitamins-Minerals (A[place]NE) CHEW, Chew 3 tablets by mouth daily. , Disp: , Rfl:  .  NARCAN 4 MG/0.1ML LIQD nasal spray kit, Place 1 spray into the nose as directed. overdose, Disp: , Rfl:  .  NIFEdipine (PROCARDIA-XL/ADALAT-CC/NIFEDICAL-XL) 30 MG 24 hr tablet, Take 30 mg by mouth daily., Disp: , Rfl:  .  NUCYNTA ER 200 MG TB12, Take 1 tablet by mouth 2 (two) times daily., Disp: , Rfl:  .  omeprazole (PRILOSEC) 20 MG capsule, Take 1 capsule (20 mg total) by mouth daily., Disp: 90 capsule, Rfl: 3 .  oxyCODONE (ROXICODONE) 15 MG immediate release tablet, Take 15 mg by mouth every 6 (six) hours. , Disp: , Rfl:  .  pantoprazole (PROTONIX) 40 MG tablet, Take 40 mg by mouth 2 (two) times daily., Disp: , Rfl:  .  TECFIDERA 240 MG  CPDR, Take 1 capsule by mouth 2 (two) times daily., Disp: , Rfl:  .  tiZANidine (ZANAFLEX) 2 MG tablet, Take 2 mg by mouth 2 (two) times daily., Disp: , Rfl:  .  VIBERZI 100 MG TABS, Take 1 tablet by mouth 2 (two) times daily., Disp: , Rfl:  .  vitamin B-12 (CYANOCOBALAMIN) 1000 MCG tablet, Take 1,000 mcg by mouth daily. , Disp: , Rfl:  .  XIIDRA 5 % SOLN, Place 1 drop into both eyes in the morning and at bedtime. , Disp: , Rfl:  .  zolpidem (AMBIEN) 10 MG tablet, Take 10 mg by mouth at bedtime. , Disp: , Rfl:   PAST MEDICAL HISTORY: Past Medical History:  Diagnosis Date  . Diabetes mellitus   . Hypertension   . Multiple sclerosis exacerbation ([place])     PAST SURGICAL HISTORY: Past Surgical History:  Procedure Laterality Date  . ABDOMINAL HYSTERECTOMY     partial  . JOINT REPLACEMENT     LTK  . TUBAL LIGATION      FAMILY HISTORY: Family History  Problem Relation Age of Onset  . Diabetes Mother   . Hypertension Mother   . Stroke Father     SOCIAL HISTORY:  Social History   Socioeconomic History  . Marital status: Single    Spouse name: Not on file  . Number of children: 2  . Years of education: Not on file  . Highest education level: Not on file  Occupational History  . Not on file  Tobacco Use  . Smoking status: Current Every Day Smoker    Packs/day: 0.25    Years: 12.00    Pack years: 3.00    Types: Cigarettes  . Smokeless tobacco: Never Used  . Tobacco comment: 06/13/20 1 pk per 3 days  Substance and Sexual Activity  . Alcohol use: No  . Drug use: No  . Sexual activity: Not on file  Other Topics Concern  . Not on file  Social History Narrative   06/13/20 lives alone, dgtr across the street   Social Determinants of Health   Financial  Resource Strain: Not on file  Food Insecurity: Not on file  Transportation Needs: Not on file  Physical Activity: Not on file  Stress: Not on file  Social Connections: Not on file  Intimate Partner Violence: Not on file      PHYSICAL EXAM  Vitals:   10/23/20 1557  BP: (!) 196/96  Pulse: (!) 115  Weight: 287 lb (130.2 kg)  Height: 5' 3"  (1.6 m)    Body mass index is 50.84 kg/m.   General: The patient is well-developed and well-nourished and in no acute distress  HEENT:  Head is [place]/AT.  Sclera are anicteric.     Skin: Extremities are without rash.  She has some edema at the ankles.  Musculoskeletal: She reports tenderness over the paraspinal muscles of the neck and lower back and myalgias at typical fibromyalgia tender points.  Neurologic Exam  Mental status: The patient is alert and oriented x 3 at the time of the examination. The patient has apparent normal recent and remote memory, with an apparently normal attention span and concentration ability.   Speech is normal.  Cranial nerves: Extraocular movements are full.  Facial strength and sensation was normal.  No obvious hearing deficits are noted.  Motor:  Muscle bulk is normal.   Tone is normal. Strength is  5 / 5 in all 4 extremities except 4+/5 ankles,4/5 toes, slightly worse on left. .   Sensory: Sensory testing is reduced in her left arm and reduced touch/temp in left leg though vibration is symmetric in legs.   Also, decreased sensation in toes and ankles to vibration compared to knees  Coordination: Cerebellar testing reveals good finger-nose-finger and heel-to-shin bilaterally.  Gait and station: Station is normal.   Gait is arthritic. Tandem gait is wide. Romberg is negative.   Reflexes: Deep tendon reflexes are 1 and symmetric in the limbs.       DIAGNOSTIC DATA (LABS, IMAGING, TESTING) - I reviewed patient records, labs, notes, testing and imaging myself where available.  Lab Results  Component Value Date   WBC 13.6 (H) 06/13/2020   HGB 14.4 06/13/2020   HCT 42.0 06/13/2020   MCV 88 06/13/2020   PLT 296 06/13/2020      Component Value Date/Time   NA 142 11/17/2019 1242   K 3.3 (L) 11/17/2019 1242   CL 107  11/17/2019 1242   CO2 24 11/17/2019 1242   GLUCOSE 81 11/17/2019 1242   BUN 11 11/17/2019 1242   CREATININE 0.92 11/17/2019 1242   CREATININE 0.85 02/24/2012 1044   CALCIUM 9.5 11/17/2019 1242   PROT 6.7 03/01/2012 1653   ALBUMIN 3.4 (L) 03/01/2012 1653   AST 26 03/01/2012 1653   ALT 24 03/01/2012 1653   ALKPHOS 98 03/01/2012 1653   BILITOT 0.7 03/01/2012 1653   GFRNONAA >60 11/17/2019 1242   GFRAA >60 11/17/2019 1242   Lab Results  Component Value Date   CHOL (H) 02/19/2009    237        ATP III CLASSIFICATION:  <200     mg/dL   Desirable  200-239  mg/dL   Borderline High  >=240    mg/dL   High          HDL 52 02/19/2009   LDLCALC (H) 02/19/2009    135        Total Cholesterol/HDL:CHD Risk Coronary Heart Disease Risk Table  Men   Women  1/2 Average Risk   3.4   3.3  Average Risk       5.0   4.4  2 X Average Risk   9.6   7.1  3 X Average Risk  23.4   11.0        Use the calculated Patient Ratio above and the CHD Risk Table to determine the patient's CHD Risk.        ATP III CLASSIFICATION (LDL):  <100     mg/dL   Optimal  100-129  mg/dL   Near or Above                    Optimal  130-159  mg/dL   Borderline  160-189  mg/dL   High  >190     mg/dL   Very High   TRIG 249 (H) 02/19/2009   CHOLHDL 4.6 02/19/2009   Lab Results  Component Value Date   HGBA1C 5.3 03/01/2012   Lab Results  Component Value Date   VITAMINB12 711 11/06/2008   Lab Results  Component Value Date   TSH 2.566 02/24/2012       ASSESSMENT AND PLAN  Multiple sclerosis (HCC)  Gait disturbance  Other chronic pain  High risk medication use   1.    Continue Tecfidera.  She may be having a mild exacerbation.  I will have her get 1 g IV Solu-Medrol today and tomorrow and consider additional steroid depending on response. 2.    She will continue to see pain management 3.    Encouraged weight loss.  She tries to be active and should try to increase his activity  further.   4.    Return in 6 months or sooner if there are new or worsening neurologic symptoms.   Owen Pagnotta A. Felecia Shelling, MD, [facility] 0/0/5110, 2:11 PM Certified in Neurology, Clinical Neurophysiology, Sleep Medicine and Neuroimaging  [facility] [address], [place] [employer], [place] [address] [phone]

## 2020-11-11 ENCOUNTER — Telehealth: Payer: Self-pay | Admitting: Neurology

## 2020-11-11 NOTE — Telephone Encounter (Signed)
Called pt back. First fall on 10/14/20, she hit back/shoulders.  She saw Dr. Epimenio Foot on 10/23/20. Second fall 11/08/20, hit right knee, still store, but denies anything being broken. Third fall on 11/09/20. Daughter in law present. She was not able to get back into bed at first. Had to rest for awhile and then got help back into bed. Denies hitting head or significant injuries. She will call if she has any further falls.   Scheduled 6 month follow up for 04/28/21 at 830am.

## 2020-11-11 NOTE — Telephone Encounter (Signed)
FYI-Pt called wanting to inform the provider that she has fallen 3 times.

## 2020-11-12 NOTE — Telephone Encounter (Signed)
Dr. Sater- what would you recommend? 

## 2020-11-12 NOTE — Telephone Encounter (Signed)
Called pt and offered appt tomorrow at 8am or 12pm. Pt accepted 12pm. Asked she check in by 11:30am. She verbalized understanding.

## 2020-11-12 NOTE — Telephone Encounter (Signed)
Pt called, had a fall last night, have a knot on right leg. Would like a call from the nurse.

## 2020-11-12 NOTE — Telephone Encounter (Signed)
Called pt back. Had another fall last night coming out of the bathroom/legs gave out on her. Has knot on right leg/bruised up. Denies anything being broken. She was not using cane. I recommended she use cane at all times at this point to avoid future falls. She should ice right leg, on/58min off. Advised I will speak with MD about additional fall and call her back.

## 2020-11-13 ENCOUNTER — Encounter: Payer: Self-pay | Admitting: Neurology

## 2020-11-13 ENCOUNTER — Telehealth: Payer: Self-pay | Admitting: *Deleted

## 2020-11-13 ENCOUNTER — Ambulatory Visit (INDEPENDENT_AMBULATORY_CARE_PROVIDER_SITE_OTHER): Payer: Medicare Other | Admitting: Neurology

## 2020-11-13 VITALS — BP 190/91 | HR 107 | Ht 63.0 in | Wt 276.0 lb

## 2020-11-13 DIAGNOSIS — Z79899 Other long term (current) drug therapy: Secondary | ICD-10-CM

## 2020-11-13 DIAGNOSIS — G35 Multiple sclerosis: Secondary | ICD-10-CM | POA: Diagnosis not present

## 2020-11-13 DIAGNOSIS — M79604 Pain in right leg: Secondary | ICD-10-CM | POA: Diagnosis not present

## 2020-11-13 DIAGNOSIS — R600 Localized edema: Secondary | ICD-10-CM

## 2020-11-13 DIAGNOSIS — M79605 Pain in left leg: Secondary | ICD-10-CM

## 2020-11-13 MED ORDER — DALFAMPRIDINE ER 10 MG PO TB12
ORAL_TABLET | ORAL | 5 refills | Status: DC
Start: 2020-11-13 — End: 2021-02-03

## 2020-11-13 MED ORDER — PREDNISONE 50 MG PO TABS: ORAL_TABLET | ORAL | 0 refills | Status: DC

## 2020-11-13 NOTE — Progress Notes (Signed)
[facility]  PATIENT: [name] DOB: 1960-12-20  REFERRING DOCTOR OR PCP: Lucky Cowboy, FNP SOURCE: Patient, notes from primary care, imaging and lab reports, MRI images personally reviewed.  _________________________________   HISTORICAL  CHIEF COMPLAINT:  Chief Complaint  Patient presents with   Follow-up    RM 11, alone. Here for ongoing falls. Ambulates w/ cane.    HISTORY OF PRESENT ILLNESS:  [name] is a 60 y.o. woman with multiple sclerosis, LBP and gait disturbance  Update 11/13/2020: For MS, she is on Tecficdera and tolerates it well.   She has fallen several times in last few weeks.    Her left leg is the weaker but both have given out.   With one fall she needed help to get up.  She did not tri.  One fall was in the shower.   She came in a couple weeks ago when symptoms started.  I had her do 2 days of IV Solu-Medrol.  She is using a cane due to the falls and reduced gait.  She is fatiguing more rapidly.   She is waiting to get her electric wheelchair.   She stumbles and her legs sometimes give out.     She reports a lot of burning dysesthetic pain in her arms and legs.   She is on lamotrigine as well for pain.    Since gabapentin had not helped she stopped.  She gained weight on it .  She is having some difficulty doing activities of daily living.  Her vision is doing about the same.  She has urinary urgency and frequency.  She also has LBP and sees Dr. Christoper Fabian for pain management and is on Nucynta and oxycodone 15 mg q6  She had MBBB/RFA helped the right side but not the left side.  Surgery has been discussed with her.   She is also on tizanidine, baclofen and amitriptyline to help her pain.   She sees Dr. Maia Petties.     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  This is worse since she ran out of her Ambien.  Even with her pain medications and Ambien, sleep is poor (4-6 hours many nights -- falls asleep well but then  wakes up).    She sees [facility] for [facility] Management.   She is on oxycodone, Nucynta and zolpidem.     MS HISTORY She was diagnosed with MS in 2008 after presenting with leg weakness and headache, neck pain, back pain.  MRIs showed foci in her cervical spine and brain.   She was started on Rebif.   Initially she did well but she reports having a lot of relapses.   She was switched to Copaxone but also had more symptoms.   She was then switched to Tysabri for several years but reports having several relapses.   She switched to Tecfidera 5 years ago.   She reports having a flare in 2015 and had 5 days of IV Solu-medrol.   July 2021, she was complaining of blurry vision, stiff neck and increased burning and tingling a few weeks ago.  She went to the ED and had MRIs performed showing no new lesions.    IMAGING: MRI of the cervical spine 11/18/2019 and 03/02/2012 showed two T2 hyperintense foci adjacent to C2 and C3-C4.  There was mild multilevel degenerative changes as well.    MRI of the brain 11/18/2019 and 03/02/2020 show a fairly low plaque burden of T2/FLAIR  hyperintense foci.  Most of them are nonspecific.  A few are periventricular.  None acute and no change over time.  REVIEW OF SYSTEMS: Constitutional: No fevers, chills, sweats, or change in appetite.  She has fatigue and she reports insomnia Eyes: No visual changes, double vision, eye pain Ear, nose and throat: No hearing loss, ear pain, nasal congestion, sore throat Cardiovascular: No chest pain, palpitations Respiratory:  No shortness of breath at rest or with exertion.   No wheezes GastrointestinaI: No nausea, vomiting, diarrhea, abdominal pain, fecal incontinence Genitourinary:  No dysuria, urinary retention or frequency.  No nocturia. Musculoskeletal: Pain all over in muscles, joints and spine Integumentary: No rash, pruritus, skin lesions Neurological: as above Psychiatric: She has depression and anxiety Endocrine: No  palpitations, diaphoresis, change in appetite, change in weigh or increased thirst Hematologic/Lymphatic:  No anemia, purpura, petechiae. Allergic/Immunologic: No itchy/runny eyes, nasal congestion, recent allergic reactions, rashes  ALLERGIES: No Known Allergies  HOME MEDICATIONS:  Current Outpatient Medications:    acetaminophen (TYLENOL) 325 MG tablet, Take 650 mg by mouth 3 (three) times daily as needed for moderate pain., Disp: , Rfl:    amitriptyline (ELAVIL) 25 MG tablet, Take 1 tablet (25 mg total) by mouth at bedtime., Disp: 30 tablet, Rfl: 5   ANTI-DIARRHEAL 2 MG tablet, Take 2 mg by mouth as needed for diarrhea or loose stools. , Disp: , Rfl:    atorvastatin (LIPITOR) 80 MG tablet, Take 1 tablet (80 mg total) by mouth daily. (Patient taking differently: Take 80 mg by mouth every evening.), Disp: 90 tablet, Rfl: 3   baclofen (LIORESAL) 20 MG tablet, Take 20 mg by mouth 2 (two) times daily. , Disp: , Rfl:    BIOTIN PO, Take by mouth., Disp: , Rfl:    busPIRone (BUSPAR) 15 MG tablet, Take 15 mg by mouth 3 (three) times daily., Disp: , Rfl:    clotrimazole (MYCELEX) 10 MG troche, Take 10 mg by mouth 3 (three) times daily as needed (thrush/vaginal yeast infections). , Disp: , Rfl:    dalfampridine 10 MG TB12, One po q12 hours, Disp: 60 tablet, Rfl: 5   dicyclomine (BENTYL) 20 MG tablet, Take 40 mg by mouth in the morning and at bedtime. , Disp: , Rfl:    diphenoxylate-atropine (LOMOTIL) 2.5-0.025 MG per tablet, Take 1 tablet by mouth 2 (two) times daily. PATIENT NEEDS OFFICE VISIT FOR ADDITIONAL REFILLS (Patient taking differently: Take 1 tablet by mouth in the morning, at noon, and at bedtime. PATIENT NEEDS OFFICE VISIT FOR ADDITIONAL REFILLS), Disp: 60 tablet, Rfl: 0   DULoxetine (CYMBALTA) 60 MG capsule, Take 60 mg by mouth 2 (two) times daily. , Disp: , Rfl:    Echinacea 400 MG CAPS, Take 400 mg by mouth in the morning and at bedtime., Disp: , Rfl:    fluticasone (FLONASE) 50 MCG/ACT  nasal spray, Place 2 sprays into the nose daily. (Patient taking differently: Place 1 spray into the nose in the morning and at bedtime.), Disp: 16 g, Rfl: 11   Homeopathic Products (LEG CRAMPS PO), Take 1-2 tablets by mouth at bedtime., Disp: , Rfl:    ibuprofen (ADVIL) 800 MG tablet, Take 800 mg by mouth 3 (three) times daily as needed for moderate pain. , Disp: , Rfl:    lamoTRIgine (LAMICTAL) 200 MG tablet, One po twicw daily, Disp: 60 tablet, Rfl: 11   losartan (COZAAR) 100 MG tablet, Take 1 tablet (100 mg total) by mouth daily. (Patient taking differently: Take 100 mg by  mouth every evening.), Disp: 90 tablet, Rfl: 3   MAGNESIUM-OXIDE 400 (241.3 Mg) MG tablet, Take 1 tablet by mouth daily., Disp: , Rfl:    MELATONIN MAXIMUM STRENGTH 5 MG TABS, Take 15 mg by mouth at bedtime., Disp: , Rfl:    montelukast (SINGULAIR) 10 MG tablet, Take 10 mg by mouth daily., Disp: , Rfl:    MUCUS RELIEF 400 MG TABS tablet, Take 400 mg by mouth 2 (two) times daily., Disp: , Rfl:    Multiple Vitamins-Calcium (ONE-A-DAY WOMENS FORMULA) TABS, Take 1 tablet by mouth daily., Disp: , Rfl:    Multiple Vitamins-Minerals (A[place]NE) CHEW, Chew 3 tablets by mouth daily. , Disp: , Rfl:    NARCAN 4 MG/0.1ML LIQD nasal spray kit, Place 1 spray into the nose as directed. overdose, Disp: , Rfl:    NIFEdipine (PROCARDIA-XL/ADALAT-CC/NIFEDICAL-XL) 30 MG 24 hr tablet, Take 30 mg by mouth daily., Disp: , Rfl:    NUCYNTA ER 200 MG TB12, Take 1 tablet by mouth 2 (two) times daily., Disp: , Rfl:    omeprazole (PRILOSEC) 20 MG capsule, Take 1 capsule (20 mg total) by mouth daily., Disp: 90 capsule, Rfl: 3   oxyCODONE (ROXICODONE) 15 MG immediate release tablet, Take 15 mg by mouth every 6 (six) hours. , Disp: , Rfl:    pantoprazole (PROTONIX) 40 MG tablet, Take 40 mg by mouth 2 (two) times daily., Disp: , Rfl:    predniSONE (DELTASONE) 50 MG tablet, 10 pills (500 mg) po qd x 3 days for MS exacerbation, Disp: 30 tablet, Rfl: 0    TECFIDERA 240 MG CPDR, Take 1 capsule by mouth 2 (two) times daily., Disp: , Rfl:    tiZANidine (ZANAFLEX) 2 MG tablet, Take 2 mg by mouth 2 (two) times daily., Disp: , Rfl:    VIBERZI 100 MG TABS, Take 1 tablet by mouth 2 (two) times daily., Disp: , Rfl:    vitamin B-12 (CYANOCOBALAMIN) 1000 MCG tablet, Take 1,000 mcg by mouth daily. , Disp: , Rfl:    XIIDRA 5 % SOLN, Place 1 drop into both eyes in the morning and at bedtime. , Disp: , Rfl:    zolpidem (AMBIEN) 10 MG tablet, Take 10 mg by mouth at bedtime. , Disp: , Rfl:   PAST MEDICAL HISTORY: Past Medical History:  Diagnosis Date   Diabetes mellitus    Hypertension    Multiple sclerosis exacerbation ([place])     PAST SURGICAL HISTORY: Past Surgical History:  Procedure Laterality Date   ABDOMINAL HYSTERECTOMY     partial   JOINT REPLACEMENT     LTK   TUBAL LIGATION      FAMILY HISTORY: Family History  Problem Relation Age of Onset   Diabetes Mother    Hypertension Mother    Stroke Father     SOCIAL HISTORY:  Social History   Socioeconomic History   Marital status: Single    Spouse name: Not on file   Number of children: 2   Years of education: Not on file   Highest education level: Not on file  Occupational History   Not on file  Tobacco Use   Smoking status: Every Day    Packs/day: 0.25    Years: 12.00    Pack years: 3.00    Types: Cigarettes   Smokeless tobacco: Never   Tobacco comments:    06/13/20 1 pk per 3 days  Substance and Sexual Activity   Alcohol use: No   Drug use: No   Sexual activity:  Not on file  Other Topics Concern   Not on file  Social History Narrative   06/13/20 lives alone, dgtr across the street   Social Determinants of Health   Financial Resource Strain: Not on file  Food Insecurity: Not on file  Transportation Needs: Not on file  Physical Activity: Not on file  Stress: Not on file  Social Connections: Not on file  Intimate Partner Violence: Not on file     PHYSICAL  EXAM  Vitals:   11/13/20 1205  BP: (!) 190/91  Pulse: (!) 107  Weight: 276 lb (125.2 kg)  Height: 5' 3" (1.6 m)    Body mass index is 48.89 kg/m.   General: The patient is well-developed and well-nourished and in no acute distress  HEENT:  Head is [place]/AT.  Sclera are anicteric.     Skin: Extremities are without rash.  She has some edema at the ankles.  Musculoskeletal: She reports tenderness over the paraspinal muscles of the neck and lower back and myalgias at typical fibromyalgia tender points.  Neurologic Exam  Mental status: The patient is alert and oriented x 3 at the time of the examination. The patient has apparent normal recent and remote memory, with an apparently normal attention span and concentration ability.   Speech is normal.  Cranial nerves: Extraocular movements are full.  Facial strength and sensation was normal.  No obvious hearing deficits are noted.  Motor:  Muscle bulk is normal.   Tone is normal. Strength is  5 / 5 in all 4 extremities except 4+/5 ankles,4/5 toes, slightly worse on left. .   Sensory: She reported reduced sensation to touch and temperature in the left arm and leg.  Vibration sensation was more symmetric. Also, decreased sensation in toes and ankles to vibration compared to knees  Coordination: Cerebellar testing reveals good finger-nose-finger and heel-to-shin bilaterally.  Gait and station: Station is normal.   Gait is arthritic. Tandem gait is wide. Romberg is negative.   Reflexes: Deep tendon reflexes are 1 and symmetric in the limbs.       DIAGNOSTIC DATA (LABS, IMAGING, TESTING) - I reviewed patient records, labs, notes, testing and imaging myself where available.  Lab Results  Component Value Date   WBC 13.6 (H) 06/13/2020   HGB 14.4 06/13/2020   HCT 42.0 06/13/2020   MCV 88 06/13/2020   PLT 296 06/13/2020      Component Value Date/Time   NA 142 11/17/2019 1242   K 3.3 (L) 11/17/2019 1242   CL 107 11/17/2019 1242    CO2 24 11/17/2019 1242   GLUCOSE 81 11/17/2019 1242   BUN 11 11/17/2019 1242   CREATININE 0.92 11/17/2019 1242   CREATININE 0.85 02/24/2012 1044   CALCIUM 9.5 11/17/2019 1242   PROT 6.7 03/01/2012 1653   ALBUMIN 3.4 (L) 03/01/2012 1653   AST 26 03/01/2012 1653   ALT 24 03/01/2012 1653   ALKPHOS 98 03/01/2012 1653   BILITOT 0.7 03/01/2012 1653   GFRNONAA >60 11/17/2019 1242   GFRAA >60 11/17/2019 1242   Lab Results  Component Value Date   CHOL (H) 02/19/2009    237        ATP III CLASSIFICATION:  <200     mg/dL   Desirable  200-239  mg/dL   Borderline High  >=240    mg/dL   High          HDL 52 02/19/2009   LDLCALC (H) 02/19/2009    135  Total Cholesterol/HDL:CHD Risk Coronary Heart Disease Risk Table                     Men   Women  1/2 Average Risk   3.4   3.3  Average Risk       5.0   4.4  2 X Average Risk   9.6   7.1  3 X Average Risk  23.4   11.0        Use the calculated Patient Ratio above and the CHD Risk Table to determine the patient's CHD Risk.        ATP III CLASSIFICATION (LDL):  <100     mg/dL   Optimal  100-129  mg/dL   Near or Above                    Optimal  130-159  mg/dL   Borderline  160-189  mg/dL   High  >190     mg/dL   Very High   TRIG 249 (H) 02/19/2009   CHOLHDL 4.6 02/19/2009   Lab Results  Component Value Date   HGBA1C 5.3 03/01/2012   Lab Results  Component Value Date   VITAMINB12 711 11/06/2008   Lab Results  Component Value Date   TSH 2.566 02/24/2012       ASSESSMENT AND PLAN  Multiple sclerosis ([place]) - Plan: Stratify JCV Antibody Test ([facility]), CBC with Differential/Platelet  High risk medication use - Plan: Stratify JCV Antibody Test ([facility]), CBC with Differential/Platelet  Pain in both lower extremities - Plan: US Venous Img Lower Bilateral (DVT)  Leg edema   1.    Continue Tecfidera.  She may be having a mild exacerbation.  She will do 1 more dose of IV Solu-Medrol (1 g) today and do 3 days of  high-dose prednisone  2.    She will continue to see pain management 3.    Encouraged weight loss.  She tries to be active and should try to increase his activity further.   4.    Return in 6 months or sooner if there are new or worsening neurologic symptoms.   Richard A. Felecia Shelling, MD, [facility] 7/32/2025, 4:27 PM Certified in Neurology, Clinical Neurophysiology, Sleep Medicine and Neuroimaging  [facility] [address], [place],  [address] [phone]

## 2020-11-13 NOTE — Telephone Encounter (Signed)
Placed J[facility] lab in [facility] lock box for routine lab pick up. Results pending. 

## 2020-11-14 LAB — CBC WITH DIFFERENTIAL/PLATELET
Basophils Absolute: 0 10*3/uL (ref 0.0–0.2)
Basos: 0 %
EOS (ABSOLUTE): 0 10*3/uL (ref 0.0–0.4)
Eos: 1 %
Hematocrit: 44.5 % (ref 34.0–46.6)
Hemoglobin: 15.4 g/dL (ref 11.1–15.9)
Immature Grans (Abs): 0.1 10*3/uL (ref 0.0–0.1)
Immature Granulocytes: 1 %
Lymphocytes Absolute: 2.4 10*3/uL (ref 0.7–3.1)
Lymphs: 33 %
MCH: 30.1 pg (ref 26.6–33.0)
MCHC: 34.6 g/dL (ref 31.5–35.7)
MCV: 87 fL (ref 79–97)
Monocytes Absolute: 0.6 10*3/uL (ref 0.1–0.9)
Monocytes: 8 %
Neutrophils Absolute: 4.1 10*3/uL (ref 1.4–7.0)
Neutrophils: 57 %
Platelets: 291 10*3/uL (ref 150–450)
RBC: 5.12 x10E6/uL (ref 3.77–5.28)
RDW: 13.1 % (ref 11.7–15.4)
WBC: 7.2 10*3/uL (ref 3.4–10.8)

## 2020-11-19 NOTE — Telephone Encounter (Signed)
JCV ab drawn on 11/13/20 positive, index: 1.48. Gave to MD to review.

## 2020-11-20 ENCOUNTER — Ambulatory Visit
Admission: RE | Admit: 2020-11-20 | Discharge: 2020-11-20 | Disposition: A | Discharge status: Home / Self Care | Payer: Medicare Other | Source: Ambulatory Visit | Attending: Neurology | Admitting: Neurology

## 2020-11-20 DIAGNOSIS — M79604 Pain in right leg: Secondary | ICD-10-CM

## 2020-11-20 DIAGNOSIS — M79605 Pain in left leg: Secondary | ICD-10-CM

## 2020-12-10 ENCOUNTER — Emergency Department (HOSPITAL_COMMUNITY): Payer: Medicare Other

## 2020-12-10 ENCOUNTER — Inpatient Hospital Stay (HOSPITAL_COMMUNITY)
Admission: EM | Admit: 2020-12-10 | Discharge: 2020-12-18 | DRG: 622 | Disposition: A | Discharge status: Home Health | Payer: Medicare Other | Attending: Family Medicine | Admitting: Family Medicine

## 2020-12-10 ENCOUNTER — Encounter (HOSPITAL_COMMUNITY): Payer: Self-pay | Admitting: Internal Medicine

## 2020-12-10 ENCOUNTER — Observation Stay (HOSPITAL_COMMUNITY): Payer: Medicare Other

## 2020-12-10 DIAGNOSIS — Z8249 Family history of ischemic heart disease and other diseases of the circulatory system: Secondary | ICD-10-CM

## 2020-12-10 DIAGNOSIS — I10 Essential (primary) hypertension: Secondary | ICD-10-CM | POA: Diagnosis present

## 2020-12-10 DIAGNOSIS — S81801A Unspecified open wound, right lower leg, initial encounter: Secondary | ICD-10-CM | POA: Diagnosis present

## 2020-12-10 DIAGNOSIS — G35 Multiple sclerosis: Secondary | ICD-10-CM | POA: Diagnosis present

## 2020-12-10 DIAGNOSIS — R739 Hyperglycemia, unspecified: Secondary | ICD-10-CM | POA: Diagnosis not present

## 2020-12-10 DIAGNOSIS — L03115 Cellulitis of right lower limb: Secondary | ICD-10-CM | POA: Diagnosis present

## 2020-12-10 DIAGNOSIS — Z823 Family history of stroke: Secondary | ICD-10-CM

## 2020-12-10 DIAGNOSIS — T402X5A Adverse effect of other opioids, initial encounter: Secondary | ICD-10-CM | POA: Diagnosis present

## 2020-12-10 DIAGNOSIS — W19XXXA Unspecified fall, initial encounter: Secondary | ICD-10-CM | POA: Diagnosis present

## 2020-12-10 DIAGNOSIS — Z90710 Acquired absence of both cervix and uterus: Secondary | ICD-10-CM

## 2020-12-10 DIAGNOSIS — I119 Hypertensive heart disease without heart failure: Secondary | ICD-10-CM | POA: Diagnosis present

## 2020-12-10 DIAGNOSIS — Z6841 Body Mass Index (BMI) 40.0 and over, adult: Secondary | ICD-10-CM

## 2020-12-10 DIAGNOSIS — L0291 Cutaneous abscess, unspecified: Secondary | ICD-10-CM

## 2020-12-10 DIAGNOSIS — Z96652 Presence of left artificial knee joint: Secondary | ICD-10-CM | POA: Diagnosis present

## 2020-12-10 DIAGNOSIS — E785 Hyperlipidemia, unspecified: Secondary | ICD-10-CM | POA: Diagnosis present

## 2020-12-10 DIAGNOSIS — E11622 Type 2 diabetes mellitus with other skin ulcer: Secondary | ICD-10-CM | POA: Diagnosis present

## 2020-12-10 DIAGNOSIS — Z20822 Contact with and (suspected) exposure to covid-19: Secondary | ICD-10-CM | POA: Diagnosis present

## 2020-12-10 DIAGNOSIS — Y92013 Bedroom of single-family (private) house as the place of occurrence of the external cause: Secondary | ICD-10-CM

## 2020-12-10 DIAGNOSIS — S80212A Abrasion, left knee, initial encounter: Secondary | ICD-10-CM | POA: Diagnosis present

## 2020-12-10 DIAGNOSIS — Z833 Family history of diabetes mellitus: Secondary | ICD-10-CM

## 2020-12-10 DIAGNOSIS — B9562 Methicillin resistant Staphylococcus aureus infection as the cause of diseases classified elsewhere: Secondary | ICD-10-CM | POA: Diagnosis present

## 2020-12-10 DIAGNOSIS — E1100 Type 2 diabetes mellitus with hyperosmolarity without nonketotic hyperglycemic-hyperosmolar coma (NKHHC): Secondary | ICD-10-CM | POA: Diagnosis not present

## 2020-12-10 DIAGNOSIS — G9340 Encephalopathy, unspecified: Secondary | ICD-10-CM | POA: Diagnosis not present

## 2020-12-10 DIAGNOSIS — F17210 Nicotine dependence, cigarettes, uncomplicated: Secondary | ICD-10-CM | POA: Diagnosis present

## 2020-12-10 DIAGNOSIS — R296 Repeated falls: Secondary | ICD-10-CM | POA: Diagnosis present

## 2020-12-10 DIAGNOSIS — L97812 Non-pressure chronic ulcer of other part of right lower leg with fat layer exposed: Secondary | ICD-10-CM | POA: Diagnosis present

## 2020-12-10 DIAGNOSIS — E870 Hyperosmolality and hypernatremia: Secondary | ICD-10-CM | POA: Diagnosis not present

## 2020-12-10 DIAGNOSIS — G629 Polyneuropathy, unspecified: Secondary | ICD-10-CM | POA: Diagnosis present

## 2020-12-10 DIAGNOSIS — G9341 Metabolic encephalopathy: Secondary | ICD-10-CM | POA: Diagnosis present

## 2020-12-10 DIAGNOSIS — R400 Somnolence: Secondary | ICD-10-CM

## 2020-12-10 DIAGNOSIS — G928 Other toxic encephalopathy: Secondary | ICD-10-CM | POA: Diagnosis present

## 2020-12-10 DIAGNOSIS — G8929 Other chronic pain: Secondary | ICD-10-CM | POA: Diagnosis present

## 2020-12-10 DIAGNOSIS — L02415 Cutaneous abscess of right lower limb: Secondary | ICD-10-CM | POA: Diagnosis present

## 2020-12-10 DIAGNOSIS — E876 Hypokalemia: Secondary | ICD-10-CM | POA: Diagnosis not present

## 2020-12-10 HISTORY — DX: Polyneuropathy, unspecified: G62.9

## 2020-12-10 LAB — CBG MONITORING, ED
Glucose-Capillary: >600 mg/dL (ref 70–99)
Glucose-Capillary: 427 mg/dL — ABNORMAL HIGH (ref 70–99)
Glucose-Capillary: 575 mg/dL (ref 70–99)
Glucose-Capillary: 356 mg/dL — ABNORMAL HIGH (ref 70–99)
Glucose-Capillary: 307 mg/dL — ABNORMAL HIGH (ref 70–99)
Glucose-Capillary: 212 mg/dL — ABNORMAL HIGH (ref 70–99)
Glucose-Capillary: 232 mg/dL — ABNORMAL HIGH (ref 70–99)
Glucose-Capillary: 320 mg/dL — ABNORMAL HIGH (ref 70–99)
Glucose-Capillary: 277 mg/dL — ABNORMAL HIGH (ref 70–99)
Glucose-Capillary: 227 mg/dL — ABNORMAL HIGH (ref 70–99)
Glucose-Capillary: 229 mg/dL — ABNORMAL HIGH (ref 70–99)

## 2020-12-10 LAB — BASIC METABOLIC PANEL
Anion gap: 8 (ref 5–15)
Anion gap: 6 (ref 5–15)
BUN: 9 mg/dL (ref 6–20)
BUN: 5 mg/dL — ABNORMAL LOW (ref 6–20)
CO2: 27 mmol/L (ref 22–32)
CO2: 27 mmol/L (ref 22–32)
Calcium: 9.6 mg/dL (ref 8.9–10.3)
Calcium: 9.5 mg/dL (ref 8.9–10.3)
Chloride: 113 mmol/L — ABNORMAL HIGH (ref 98–111)
Chloride: 109 mmol/L (ref 98–111)
Creatinine, Ser: 0.72 mg/dL (ref 0.44–1.00)
Creatinine, Ser: 0.6 mg/dL (ref 0.44–1.00)
GFR, Estimated: >60 mL/min (ref >60)
GFR, Estimated: >60 mL/min (ref >60)
Glucose, Bld: 285 mg/dL — ABNORMAL HIGH (ref 70–99)
Glucose, Bld: 188 mg/dL — ABNORMAL HIGH (ref 70–99)
Potassium: 3.6 mmol/L (ref 3.5–5.1)
Potassium: 3.4 mmol/L — ABNORMAL LOW (ref 3.5–5.1)
Sodium: 148 mmol/L — ABNORMAL HIGH (ref 135–145)
Sodium: 142 mmol/L (ref 135–145)

## 2020-12-10 LAB — CBC WITH DIFFERENTIAL/PLATELET
Abs Immature Granulocytes: 0.04 10*3/uL (ref 0.00–0.07)
Basophils Absolute: 0 10*3/uL (ref 0.0–0.1)
Basophils Relative: 0 %
Eosinophils Absolute: 0.1 10*3/uL (ref 0.0–0.5)
Eosinophils Relative: 1 %
HCT: 41.9 % (ref 36.0–46.0)
Hemoglobin: 14.5 g/dL (ref 12.0–15.0)
Immature Granulocytes: 1 %
Lymphocytes Relative: 32 %
Lymphs Abs: 2.5 10*3/uL (ref 0.7–4.0)
MCH: 30.7 pg (ref 26.0–34.0)
MCHC: 34.6 g/dL (ref 30.0–36.0)
MCV: 88.8 fL (ref 80.0–100.0)
Monocytes Absolute: 0.7 10*3/uL (ref 0.1–1.0)
Monocytes Relative: 9 %
Neutro Abs: 4.4 10*3/uL (ref 1.7–7.7)
Neutrophils Relative %: 57 %
Platelets: 252 10*3/uL (ref 150–400)
RBC: 4.72 MIL/uL (ref 3.87–5.11)
RDW: 12.9 % (ref 11.5–15.5)
WBC: 7.7 10*3/uL (ref 4.0–10.5)
nRBC: 0 % (ref 0.0–0.2)

## 2020-12-10 LAB — RAPID URINE DRUG SCREEN, HOSP PERFORMED
Amphetamines: NOT DETECTED
Barbiturates: NOT DETECTED
Benzodiazepines: NOT DETECTED
Cocaine: NOT DETECTED
Opiates: NOT DETECTED
Tetrahydrocannabinol: NOT DETECTED

## 2020-12-10 LAB — COMPREHENSIVE METABOLIC PANEL
ALT: 25 U/L (ref 0–44)
AST: 14 U/L — ABNORMAL LOW (ref 15–41)
Albumin: 3.8 g/dL (ref 3.5–5.0)
Alkaline Phosphatase: 84 U/L (ref 38–126)
Anion gap: 10 (ref 5–15)
BUN: 12 mg/dL (ref 6–20)
CO2: 27 mmol/L (ref 22–32)
Calcium: 10.4 mg/dL — ABNORMAL HIGH (ref 8.9–10.3)
Chloride: 97 mmol/L — ABNORMAL LOW (ref 98–111)
Creatinine, Ser: 1.06 mg/dL — ABNORMAL HIGH (ref 0.44–1.00)
GFR, Estimated: >60 mL/min (ref >60)
Glucose, Bld: 875 mg/dL (ref 70–99)
Potassium: 4.6 mmol/L (ref 3.5–5.1)
Sodium: 134 mmol/L — ABNORMAL LOW (ref 135–145)
Total Bilirubin: 1 mg/dL (ref 0.3–1.2)
Total Protein: 7.2 g/dL (ref 6.5–8.1)

## 2020-12-10 LAB — URINALYSIS, ROUTINE W REFLEX MICROSCOPIC
Bilirubin Urine: NEGATIVE
Glucose, UA: >=500 mg/dL — AB
Hgb urine dipstick: NEGATIVE
Ketones, ur: NEGATIVE mg/dL
Leukocytes,Ua: NEGATIVE
Nitrite: NEGATIVE
Protein, ur: NEGATIVE mg/dL
Specific Gravity, Urine: 1.024 (ref 1.005–1.030)
pH: 6 (ref 5.0–8.0)

## 2020-12-10 LAB — TROPONIN I (HIGH SENSITIVITY)
Troponin I (High Sensitivity): 12 ng/L (ref <18)
Troponin I (High Sensitivity): 10 ng/L (ref <18)

## 2020-12-10 LAB — I-STAT ARTERIAL BLOOD GAS, ED
Acid-Base Excess: 2 mmol/L (ref 0.0–2.0)
Bicarbonate: 27 mmol/L (ref 20.0–28.0)
Calcium, Ion: 1.34 mmol/L (ref 1.15–1.40)
HCT: 40 % (ref 36.0–46.0)
Hemoglobin: 13.6 g/dL (ref 12.0–15.0)
O2 Saturation: 93 %
Patient temperature: 96.8
Potassium: 4.1 mmol/L (ref 3.5–5.1)
Sodium: 139 mmol/L (ref 135–145)
TCO2: 28 mmol/L (ref 22–32)
pCO2 arterial: 39.2 mmHg (ref 32.0–48.0)
pH, Arterial: 7.441 (ref 7.350–7.450)
pO2, Arterial: 62 mmHg — ABNORMAL LOW (ref 83.0–108.0)

## 2020-12-10 LAB — CBC
HCT: 39.6 % (ref 36.0–46.0)
Hemoglobin: 13.3 g/dL (ref 12.0–15.0)
MCH: 30 pg (ref 26.0–34.0)
MCHC: 33.6 g/dL (ref 30.0–36.0)
MCV: 89.4 fL (ref 80.0–100.0)
Platelets: 237 10*3/uL (ref 150–400)
RBC: 4.43 MIL/uL (ref 3.87–5.11)
RDW: 13 % (ref 11.5–15.5)
WBC: 10.6 10*3/uL — ABNORMAL HIGH (ref 4.0–10.5)
nRBC: 0 % (ref 0.0–0.2)

## 2020-12-10 LAB — TSH: TSH: 1.973 u[IU]/mL (ref 0.350–4.500)

## 2020-12-10 LAB — AMMONIA: Ammonia: 21 umol/L (ref 9–35)

## 2020-12-10 LAB — LACTIC ACID, PLASMA: Lactic Acid, Venous: 1.4 mmol/L (ref 0.5–1.9)

## 2020-12-10 LAB — OSMOLALITY: Osmolality: 318 mOsm/kg — ABNORMAL HIGH (ref 275–295)

## 2020-12-10 LAB — BETA-HYDROXYBUTYRIC ACID: Beta-Hydroxybutyric Acid: 0.57 mmol/L — ABNORMAL HIGH (ref 0.05–0.27)

## 2020-12-10 LAB — RESP PANEL BY RT-PCR (FLU A&B, COVID) ARPGX2
Influenza A by PCR: NEGATIVE
Influenza B by PCR: NEGATIVE
SARS Coronavirus 2 by RT PCR: NEGATIVE

## 2020-12-10 LAB — ETHANOL: Alcohol, Ethyl (B): <10 mg/dL (ref <10)

## 2020-12-10 LAB — HIV ANTIBODY (ROUTINE TESTING W REFLEX): HIV Screen 4th Generation wRfx: NONREACTIVE

## 2020-12-10 LAB — ACETAMINOPHEN LEVEL: Acetaminophen (Tylenol), Serum: <10 ug/mL — ABNORMAL LOW (ref 10–30)

## 2020-12-10 LAB — SALICYLATE LEVEL: Salicylate Lvl: <7 mg/dL — ABNORMAL LOW (ref 7.0–30.0)

## 2020-12-10 LAB — BRAIN NATRIURETIC PEPTIDE: B Natriuretic Peptide: 11.2 pg/mL (ref 0.0–100.0)

## 2020-12-10 MED ORDER — VANCOMYCIN HCL IN DEXTROSE 1-5 GM/200ML-% IV SOLN
1000.0000 mg | Freq: Two times a day (BID) | INTRAVENOUS | Status: DC
  Administered 2020-12-10 – 2020-12-12 (×3): 1000 mg via INTRAVENOUS
  Filled 2020-12-10 (×6): qty 200

## 2020-12-10 MED ORDER — HYDRALAZINE HCL 20 MG/ML IJ SOLN: 10.0000 mg | INTRAMUSCULAR | Status: DC | PRN

## 2020-12-10 MED ORDER — DEXTROSE IN LACTATED RINGERS 5 % IV SOLN: INTRAVENOUS | Status: DC

## 2020-12-10 MED ORDER — NALOXONE HCL 2 MG/2ML IJ SOSY
1.0000 mg | PREFILLED_SYRINGE | Freq: Once | INTRAMUSCULAR | Status: AC
  Administered 2020-12-10: 1 mg via INTRAVENOUS
  Filled 2020-12-10: qty 2

## 2020-12-10 MED ORDER — ENOXAPARIN SODIUM 40 MG/0.4ML IJ SOSY
40.0000 mg | PREFILLED_SYRINGE | INTRAMUSCULAR | Status: DC
  Administered 2020-12-10 – 2020-12-13 (×4): 40 mg via SUBCUTANEOUS
  Filled 2020-12-10 (×4): qty 0.4

## 2020-12-10 MED ORDER — LACTATED RINGERS IV SOLN: INTRAVENOUS | Status: DC

## 2020-12-10 MED ORDER — NALOXONE HCL 2 MG/2ML IJ SOSY
1.0000 mg | PREFILLED_SYRINGE | Freq: Once | INTRAMUSCULAR | Status: AC
  Administered 2020-12-10: 1 mg via INTRAVENOUS

## 2020-12-10 MED ORDER — INSULIN REGULAR(HUMAN) IN NACL 100-0.9 UT/100ML-% IV SOLN
INTRAVENOUS | Status: DC
  Administered 2020-12-10: 15 [IU]/h via INTRAVENOUS
  Filled 2020-12-10: qty 100

## 2020-12-10 MED ORDER — DEXTROSE 50 % IV SOLN: 0.0000 mL | INTRAVENOUS | Status: DC | PRN

## 2020-12-10 MED ORDER — VANCOMYCIN HCL 10 G IV SOLR
2500.0000 mg | Freq: Once | INTRAVENOUS | Status: AC
  Administered 2020-12-10: 2500 mg via INTRAVENOUS
  Filled 2020-12-10: qty 2500

## 2020-12-10 MED ORDER — SODIUM CHLORIDE 0.9 % IV BOLUS
1000.0000 mL | Freq: Once | INTRAVENOUS | Status: AC
  Administered 2020-12-10: 1000 mL via INTRAVENOUS

## 2020-12-10 MED ORDER — INSULIN GLARGINE-YFGN 100 UNIT/ML ~~LOC~~ SOLN
38.0000 [IU] | Freq: Every day | SUBCUTANEOUS | Status: DC
  Administered 2020-12-10 – 2020-12-11 (×2): 38 [IU] via SUBCUTANEOUS
  Filled 2020-12-10 (×2): qty 0.38

## 2020-12-10 MED ORDER — INSULIN ASPART 100 UNIT/ML IJ SOLN
0.0000 [IU] | Freq: Every day | INTRAMUSCULAR | Status: DC
  Administered 2020-12-10: 2 [IU] via SUBCUTANEOUS

## 2020-12-10 MED ORDER — SODIUM CHLORIDE 0.9 % IV SOLN
2.0000 g | Freq: Three times a day (TID) | INTRAVENOUS | Status: DC
  Administered 2020-12-10 – 2020-12-12 (×6): 2 g via INTRAVENOUS
  Filled 2020-12-10 (×8): qty 2

## 2020-12-10 MED ORDER — INSULIN REGULAR(HUMAN) IN NACL 100-0.9 UT/100ML-% IV SOLN
INTRAVENOUS | Status: DC
  Administered 2020-12-10: 9.5 [IU]/h via INTRAVENOUS

## 2020-12-10 MED ORDER — INSULIN ASPART 100 UNIT/ML IJ SOLN
0.0000 [IU] | Freq: Three times a day (TID) | INTRAMUSCULAR | Status: DC
  Administered 2020-12-10: 8 [IU] via SUBCUTANEOUS

## 2020-12-10 NOTE — ED Triage Notes (Signed)
Pt arrives w large bag filled w varied prescription & OTC medication/supplements

## 2020-12-10 NOTE — ED Notes (Signed)
bs [id number]

## 2020-12-10 NOTE — ED Notes (Signed)
Attempted report to 2W. Nurse unable to accept.

## 2020-12-10 NOTE — ED Notes (Signed)
Patient awakens during nursing interactions, frequently requesting food and water. Quickly falls back to sleep when not engaged in conversation.

## 2020-12-10 NOTE — ED Notes (Signed)
Pt is going to CT. 

## 2020-12-10 NOTE — ED Provider Notes (Signed)
[facility] EMERGENCY DEPARTMENT Provider Note   CSN: [id number] Arrival date & time: 12/10/20  0248     History Chief Complaint  Patient presents with   Altered Mental Status    [name] is a 60 y.o. female.  Level 5 caveat for altered mental status.  Patient brought in by EMS with change in mental status over the past 24 hours.  She apparently lives alone and is able to form her ADLs.  Family reports she is a little confused at baseline but significantly worse over the past day.  Patient was on the floor on fire department arrival and has had recurrent falls.  She is extremely somnolent on arrival but responds to pain and follows some commands.  Does not speak.  Found to have abrasion to her left knee. Hyperglycemic over 500. Does take oxycodone at home. Sinus tachycardia without fever.  Has draining wound to right shin.  The history is provided by the patient and a relative. The history is limited by the condition of the patient.  Altered Mental Status     Past Medical History:  Diagnosis Date   Diabetes mellitus    Hypertension    Multiple sclerosis exacerbation [facility])     Patient Active Problem List   Diagnosis Date Noted   Essential tremor 07/15/2020   Gait disturbance 06/13/2020   High risk medication use 12/08/2019   Left carpal tunnel syndrome 08/19/2012   Depression with anxiety 08/19/2012   Lumbar spondylosis 03/07/2012   Osteoarthrosis, unspecified whether generalized or localized, lower leg 03/07/2012   Diabetic peripheral neuropathy ([place]) 03/07/2012   SMOKER 06/02/2010   SIMPLE CHRONIC BRONCHITIS 06/02/2010   DYSPNEA 06/02/2010   SKIN RASH 08/05/2009   ACUTE CYSTITIS 05/03/2009   DIABETES MELLITUS 02/18/2009   INSOMNIA 02/12/2009   MORBID OBESITY 11/28/2008   LIVER FUNCTION TESTS, ABNORMAL, HX OF 11/28/2008   DEGENERATION INTERVERTEBRAL DISC SITE UNSPEC 07/02/2008   Other chronic pain 05/22/2008   BLURRED VISION 05/22/2008    ESOPHAGEAL REFLUX 01/10/2008   Multiple sclerosis ([place]) 12/09/2007   URINARY RETENTION 12/09/2007   TACHYCARDIA 11/17/2007   HYPERTENSION 11/08/2007    Past Surgical History:  Procedure Laterality Date   ABDOMINAL HYSTERECTOMY     partial   JOINT REPLACEMENT     LTK   TUBAL LIGATION       OB History   No obstetric history on file.     Family History  Problem Relation Age of Onset   Diabetes Mother    Hypertension Mother    Stroke Father     Social History   Tobacco Use   Smoking status: Every Day    Packs/day: 0.25    Years: 12.00    Pack years: 3.00    Types: Cigarettes   Smokeless tobacco: Never   Tobacco comments:    06/13/20 1 pk per 3 days  Substance Use Topics   Alcohol use: No   Drug use: No    Home Medications Prior to Admission medications   Medication Sig Start Date End Date Taking? Authorizing Provider  acetaminophen (TYLENOL) 325 MG tablet Take 650 mg by mouth 3 (three) times daily as needed for moderate pain.    [provider]  amitriptyline (ELAVIL) 25 MG tablet Take 1 tablet (25 mg total) by mouth at bedtime. 10/07/20   Sater, Nanine Means, MD  ANTI-DIARRHEAL 2 MG tablet Take 2 mg by mouth as needed for diarrhea or loose stools.  07/31/19  [provider]  atorvastatin (LIPITOR) 80 MG tablet Take 1 tablet (80 mg total) by mouth daily. Patient taking differently: Take 80 mg by mouth every evening. 02/24/12   Copland, Gay Filler, MD  baclofen (LIORESAL) 20 MG tablet Take 20 mg by mouth 2 (two) times daily.  08/28/19   [provider]  BIOTIN PO Take by mouth.    [provider]  busPIRone (BUSPAR) 15 MG tablet Take 15 mg by mouth 3 (three) times daily. 10/28/19   [provider]  clotrimazole (MYCELEX) 10 MG troche Take 10 mg by mouth 3 (three) times daily as needed (thrush/vaginal yeast infections).  07/03/19   [provider]  dalfampridine 10 MG TB12 One po q12 hours 11/13/20   Sater, Nanine Means, MD   dicyclomine (BENTYL) 20 MG tablet Take 40 mg by mouth in the morning and at bedtime.  10/23/19   [provider]  diphenoxylate-atropine (LOMOTIL) 2.5-0.025 MG per tablet Take 1 tablet by mouth 2 (two) times daily. PATIENT NEEDS OFFICE VISIT FOR ADDITIONAL REFILLS Patient taking differently: Take 1 tablet by mouth in the morning, at noon, and at bedtime. PATIENT NEEDS OFFICE VISIT FOR ADDITIONAL REFILLS 10/18/12   Copland, Gay Filler, MD  DULoxetine (CYMBALTA) 60 MG capsule Take 60 mg by mouth 2 (two) times daily.  10/04/19   [provider]  Echinacea 400 MG CAPS Take 400 mg by mouth in the morning and at bedtime.    [provider]  fluticasone (FLONASE) 50 MCG/ACT nasal spray Place 2 sprays into the nose daily. Patient taking differently: Place 1 spray into the nose in the morning and at bedtime. 02/24/12   Copland, Gay Filler, MD  Homeopathic Products (LEG CRAMPS PO) Take 1-2 tablets by mouth at bedtime.    [provider]  ibuprofen (ADVIL) 800 MG tablet Take 800 mg by mouth 3 (three) times daily as needed for moderate pain.  09/25/19   [provider]  lamoTRIgine (LAMICTAL) 200 MG tablet One po twicw daily 06/13/20   Sater, Nanine Means, MD  losartan (COZAAR) 100 MG tablet Take 1 tablet (100 mg total) by mouth daily. Patient taking differently: Take 100 mg by mouth every evening. 02/24/12   Copland, Gay Filler, MD  MAGNESIUM-OXIDE 400 (241.3 Mg) MG tablet Take 1 tablet by mouth daily. 10/18/19   [provider]  MELATONIN MAXIMUM STRENGTH 5 MG TABS Take 15 mg by mouth at bedtime. 07/31/19   [provider]  montelukast (SINGULAIR) 10 MG tablet Take 10 mg by mouth daily. 07/31/19   [provider]  MUCUS RELIEF 400 MG TABS tablet Take 400 mg by mouth 2 (two) times daily. 07/31/19   [provider]  Multiple Vitamins-Calcium (ONE-A-DAY WOMENS FORMULA) TABS Take 1 tablet by mouth daily. 07/31/19   [provider]  Multiple  Vitamins-Minerals (AIRBORNE) CHEW Chew 3 tablets by mouth daily.  07/31/19   [provider]  NARCAN 4 MG/0.1ML LIQD nasal spray kit Place 1 spray into the nose as directed. overdose 10/11/19   [provider]  NIFEdipine (PROCARDIA-XL/ADALAT-CC/NIFEDICAL-XL) 30 MG 24 hr tablet Take 30 mg by mouth daily. 02/24/12   Copland, Gay Filler, MD  NUCYNTA ER 200 MG TB12 Take 1 tablet by mouth 2 (two) times daily. 11/10/19   [provider]  omeprazole (PRILOSEC) 20 MG capsule Take 1 capsule (20 mg total) by mouth daily. 02/24/12   Copland, Gay Filler, MD  oxyCODONE (ROXICODONE) 15 MG immediate release tablet Take 15  mg by mouth every 6 (six) hours.  09/11/19   [provider]  pantoprazole (PROTONIX) 40 MG tablet Take 40 mg by mouth 2 (two) times daily. 10/09/19   [provider]  predniSONE (DELTASONE) 50 MG tablet 10 pills (500 mg) po qd x 3 days for MS exacerbation 11/13/20   Sater, Nanine Means, MD  TECFIDERA 240 MG CPDR Take 1 capsule by mouth 2 (two) times daily. 10/31/19   [provider]  tiZANidine (ZANAFLEX) 2 MG tablet Take 2 mg by mouth 2 (two) times daily. 10/23/19   [provider]  VIBERZI 100 MG TABS Take 1 tablet by mouth 2 (two) times daily. 11/07/19   [provider]  vitamin B-12 (CYANOCOBALAMIN) 1000 MCG tablet Take 1,000 mcg by mouth daily.  07/31/19   [provider]  XIIDRA 5 % SOLN Place 1 drop into both eyes in the morning and at bedtime.  11/10/19   [provider]  zolpidem (AMBIEN) 10 MG tablet Take 10 mg by mouth at bedtime.  10/28/19   [provider]    Allergies    Patient has no known allergies.  Review of Systems   Review of Systems  Unable to perform ROS: Mental status change   Physical Exam Updated Vital Signs BP 119/78 (BP Location: Right Arm)   Pulse (!) 104   Temp (!) 96.7 F (35.9 C) (Temporal)   Resp 12   SpO2 93%   Physical Exam Vitals and nursing note reviewed.   Constitutional:      Appearance: She is well-developed. She is obese.     Comments: Obtunded, responds to pain by opening her eyes and grimacing.  Does not speak  HENT:     Head: Normocephalic and atraumatic.     Mouth/Throat:     Mouth: Mucous membranes are dry.     Pharynx: No oropharyngeal exudate.     Comments: Dry mucous membranes, protecting airway, gag intact Eyes:     Conjunctiva/sclera: Conjunctivae normal.     Pupils: Pupils are equal, round, and reactive to light.  Neck:     Comments: No meningismus. Cardiovascular:     Rate and Rhythm: Regular rhythm. Tachycardia present.     Heart sounds: Normal heart sounds. No murmur heard. Pulmonary:     Effort: Pulmonary effort is normal. No respiratory distress.     Breath sounds: Normal breath sounds.  Abdominal:     Palpations: Abdomen is soft.     Tenderness: There is no abdominal tenderness. There is no guarding or rebound.  Musculoskeletal:        General: Tenderness present. Normal range of motion.     Cervical back: Normal range of motion and neck supple.     Comments: Purulent draining wound to right shin without surrounding erythema  Skin:    General: Skin is warm.  Neurological:     Cranial Nerves: No cranial nerve deficit.     Motor: No abnormal muscle tone.     Coordination: Coordination normal.     Comments: Grimaces to pain, does not speak, moves all extremities and follows commands intermittently.  Psychiatric:        Behavior: Behavior normal.    ED Results / Procedures / Treatments   Labs (all labs ordered are listed, but only abnormal results are displayed) Labs Reviewed  COMPREHENSIVE METABOLIC PANEL - Abnormal; Notable for the following components:      Result Value   Sodium 134 (*)    Chloride  97 (*)    Glucose, Bld 875 (*)    Creatinine, Ser 1.06 (*)    Calcium 10.4 (*)    AST 14 (*)    All other components within normal limits  SALICYLATE LEVEL - Abnormal; Notable for the following  components:   Salicylate Lvl <1.4 (*)    All other components within normal limits  ACETAMINOPHEN LEVEL - Abnormal; Notable for the following components:   Acetaminophen (Tylenol), Serum <10 (*)    All other components within normal limits  URINALYSIS, ROUTINE W REFLEX MICROSCOPIC - Abnormal; Notable for the following components:   Color, Urine STRAW (*)    Glucose, UA >=500 (*)    Bacteria, UA RARE (*)    All other components within normal limits  BETA-HYDROXYBUTYRIC ACID - Abnormal; Notable for the following components:   Beta-Hydroxybutyric Acid 0.57 (*)    All other components within normal limits  I-STAT ARTERIAL BLOOD GAS, ED - Abnormal; Notable for the following components:   pO2, Arterial 62 (*)    All other components within normal limits  CBG MONITORING, ED - Abnormal; Notable for the following components:   Glucose-Capillary >600 (*)    All other components within normal limits  CBG MONITORING, ED - Abnormal; Notable for the following components:   Glucose-Capillary 575 (*)    All other components within normal limits  CBG MONITORING, ED - Abnormal; Notable for the following components:   Glucose-Capillary 427 (*)    All other components within normal limits  RESP PANEL BY RT-PCR (FLU A&B, COVID) ARPGX2  CULTURE, BLOOD (ROUTINE X 2)  CULTURE, BLOOD (ROUTINE X 2)  LACTIC ACID, PLASMA  CBC WITH DIFFERENTIAL/PLATELET  BRAIN NATRIURETIC PEPTIDE  ETHANOL  RAPID URINE DRUG SCREEN, HOSP PERFORMED  TSH  AMMONIA  LACTIC ACID, PLASMA  HIV ANTIBODY (ROUTINE TESTING W REFLEX)  CBC  BASIC METABOLIC PANEL  BASIC METABOLIC PANEL  BASIC METABOLIC PANEL  BASIC METABOLIC PANEL  BASIC METABOLIC PANEL  OSMOLALITY  HEMOGLOBIN A1C  TROPONIN I (HIGH SENSITIVITY)  TROPONIN I (HIGH SENSITIVITY)    EKG EKG Interpretation  Date/Time:  Tuesday December 10 2020 02:58:32 EDT Ventricular Rate:  106 PR Interval:  129 QRS Duration: 87 QT Interval:  354 QTC Calculation: 471 R  Axis:   51 Text Interpretation: Sinus tachycardia No significant change was found Confirmed by Ezequiel Essex [phone]) on 12/10/2020 3:07:19 AM  Radiology DG Tibia/Fibula Right  Result Date: 12/10/2020 CLINICAL DATA:  RLE wound EXAM: RIGHT TIBIA AND FIBULA - 2 VIEW COMPARISON:  01/18/2010. FINDINGS: Soft tissue swelling. No acute or focal bony abnormality identified. No evidence of fracture dislocation. Degenerative changes noted about the right knee and ankle. IMPRESSION: Soft tissue swelling. No radiopaque foreign body. No acute bony abnormality. Electronically Signed   By: Marcello Moores  Register   On: 12/10/2020 04:33   CT Head Wo Contrast  Result Date: 12/10/2020 CLINICAL DATA:  59 year old female status post fall. Some confusion. History of multiple sclerosis. EXAM: CT HEAD WITHOUT CONTRAST TECHNIQUE: Contiguous axial images were obtained from the base of the skull through the vertex without intravenous contrast. COMPARISON:  Brain MRI 11/18/2019.  Head CT 02/25/2012. FINDINGS: Brain: Cerebral volume is within normal limits for age. No midline shift, ventriculomegaly, mass effect, evidence of mass lesion, intracranial hemorrhage or evidence of cortically based acute infarction. Gray-white matter differentiation is within normal limits for age. No cortical encephalomalacia identified. Vascular: Mild Calcified atherosclerosis at the skull base. No suspicious intracranial vascular hyperdensity. Skull: Stable and intact.  Sinuses/Orbits: Visualized paranasal sinuses and mastoids are stable and well aerated. Other: No orbit or scalp soft tissue injury identified. IMPRESSION: Negative for age non contrast CT appearance of the brain. No acute traumatic injury identified. Electronically Signed   By: Genevie Ann M.D.   On: 12/10/2020 05:29   CT Cervical Spine Wo Contrast  Result Date: 12/10/2020 CLINICAL DATA:  60 year old female status post fall. Some confusion. History of multiple sclerosis. EXAM: CT CERVICAL SPINE  WITHOUT CONTRAST TECHNIQUE: Multidetector CT imaging of the cervical spine was performed without intravenous contrast. Multiplanar CT image reconstructions were also generated. COMPARISON:  Head CT today reported separately. FINDINGS: Alignment: Straightening of cervical lordosis. Cervicothoracic junction alignment is within normal limits. Bilateral posterior element alignment is within normal limits. Skull base and vertebrae: Visualized skull base is intact. No atlanto-occipital dissociation. C1 and C2 appear intact and aligned. No acute osseous abnormality identified. Soft tissues and spinal canal: No prevertebral fluid or swelling. No visible canal hematoma. Partially retropharyngeal course of both carotids with some calcified atherosclerosis. Otherwise negative visible noncontrast neck soft tissues. Disc levels: Advanced lower cervical disc and endplate degeneration with mild cervical spinal stenosis suspected at both C5-C6 and C6-C7. Possible developing Ossification of the posterior longitudinal ligament (OPLL). At those levels. Upper chest: Visible upper thoracic levels appear grossly intact. Negative lung apices. IMPRESSION: 1. No acute traumatic injury identified in the cervical spine. 2. 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 Electronically Signed   By: Genevie Ann M.D.   On: 12/10/2020 05:32   DG Chest Portable 1 View  Result Date: 12/10/2020 CLINICAL DATA:  Right lower extremity wound. EXAM: PORTABLE CHEST 1 VIEW COMPARISON:  04/18/2012. FINDINGS: Cardiomegaly with mild bilateral interstitial prominence and tiny bilateral pleural effusions. Findings suggest CHF. Pneumonitis cannot be excluded. No acute bony abnormality. IMPRESSION: Findings suggesting mild CHF. Electronically Signed   By: Marcello Moores  Register   On: 12/10/2020 04:34     Procedures .Critical Care  Date/Time: 12/10/2020 6:49 AM Performed by: Ezequiel Essex, MD Authorized by: Ezequiel Essex, MD   Critical care provider statement:    Critical care time (minutes):  60   Critical care was necessary to treat or prevent imminent or life-threatening deterioration of the following conditions:  CNS failure or compromise and toxidrome   Critical care was time spent personally by me on the following activities:  Discussions with consultants, evaluation of patient's response to treatment, examination of patient, ordering and performing treatments and interventions, ordering and review of laboratory studies, ordering and review of radiographic studies, pulse oximetry, re-evaluation of patient's condition, obtaining history from patient or surrogate and review of old charts   Medications Ordered in ED Medications  sodium chloride 0.9 % bolus 1,000 mL (has no administration in time range)  naloxone [facility]) injection 1 mg (has no administration in time range)    ED Course  I have reviewed the triage vital signs and the nursing notes.  Pertinent labs & imaging results that were available during my care of the patient were reviewed by me and considered in my medical decision making (see chart for details).    MDM Rules/Calculators/A&P                           Altered mental status from home here with hyperglycemia, decreased mentation, somnolence. Did hit her head  but was apparently sent home before this happened.  Narcan given on arrival Call to patient's daughter not answered.   Patient more alert after Narcan is able to state her name and that she is at the hospital.  She is not sure what happened.  Hyperglycemia without DKA.  Anion gap is normal.  Blood sugar over 900.  Will initiate IV fluids and insulin drip.  Suspect her altered mental status is a combination of oversedation from home narcotics given her response to Narcan as well as her  hyperglycemia.  No significant CO2 retention.  Ammonia is normal.  CT head and C spine negative for acute traumatic injury.   Has remained awake after 2 doses of Narcan.  Mildly tachycardic about 100-1 10.  She denies taking excessive pain medications. Has draining wound on right lower leg without evidence of osteomyelitis.  Continue IV hydration as well as IV insulin.  Admission to the hospitalist service for altered mental status likely due to opiate use and likely hyperglycemia discussed with Dr. Hal Hope.  Final Clinical Impression(s) / ED Diagnoses Final diagnoses:  Somnolence  Hyperglycemia    Rx / DC Orders ED Discharge Orders     None        Tashona Calk, Annie Main, MD 12/10/20 [phone]

## 2020-12-10 NOTE — Consult Note (Signed)
WOC Nurse Consult Note: Patient receiving care in [facility] Reason for Consult: wound on RLE Wound type: Full thickness wound that patient states started as a bump on her leg. She was supposed to go to the "doctor" tomorrow but couldn't tell me who or what kind of doctor.  Pressure Injury POA: NA Measurement: 1 cm x 1 cm x 0.3 cm x 3 cm undermining from 12 o'clock to 3 o'clock Wound bed: beefy red Drainage (amount, consistency, odor) tan purulent drainage. Periwound: Edema surrounding the wound. IV Maxipime and Vancomycin have been ordered by the MD. Dressing procedure/placement/frequency: Insert into the wound including the undermining area a piece of 1" Iodoform packing strip [place] # [phone]) cover with a 4 x 4 and secure with foam dressing. Change the Iodoform twice daily.   Monitor the wound area(s) for worsening of condition such as: Signs/symptoms of infection, increase in size, development of or worsening of odor, development of pain, or increased pain at the affected locations.   Notify the medical team if any of these develop.  Thank you for the consult. WOC nurse will not follow at this time.   Please re-consult the WOC team if needed.  Renaldo Reel Katrinka Blazing, MSN, RN, CMSRN, Angus Seller, [facility] Wound Treatment Associate Pager [phone]

## 2020-12-10 NOTE — ED Notes (Signed)
0757 check bs

## 2020-12-10 NOTE — H&P (Signed)
History and Physical    DEKOTA [name] KCL:[id number] DOB: 23-May-1960 DOA: 12/10/2020  PCP: Beverley Fiedler, FNP  Patient coming from: Home.  History obtained from ER physician.  Patient is still confused.  Chief Complaint: Altered mental status.  HPI: [name] is a 60 y.o. female with history of multiple sclerosis, diabetes mellitus, hypertension, hyperlipidemia and chronic pain was brought to the ER after patient's family felt that patient was getting increasingly confused over the last 24 hours.  I am unable to reach patient's family at this time.  Reviewing patient's chart patient was recently on steroids for multiple sclerosis exacerbation as per the notes of patient's neurologist on November 13, 2020.  ED Course: In the ER patient was very somnolent and had to be given 2 doses of Narcan 1 mg each after which patient became more alert awake but still drowsy.  Patient states that she has been frequently falling.  CT head C-spine unremarkable.  EKG shows sinus tachycardia.  There is a right leg wound on the anterior shin which some pus draining.  X-rays reveal soft tissue swelling of the right leg.  Urine drug screen is negative ABG does not show any carbon dioxide retention.  Patient blood sugar came back at 875 with anion gap of 10.  Patient was started on insulin infusion for hyperosmolar status.  Patient admitted for further management of acute encephalopathy likely a combination of pain medication and probably hyperglycemia contributing.  COVID test negative.  Review of Systems: As per HPI, rest all negative.   Past Medical History:  Diagnosis Date   Diabetes mellitus    Hypertension    Multiple sclerosis exacerbation ([place])     Past Surgical History:  Procedure Laterality Date   ABDOMINAL HYSTERECTOMY     partial   JOINT REPLACEMENT     LTK   TUBAL LIGATION       reports that she has been smoking cigarettes. She has a 3.00 pack-year smoking history. She has never  used smokeless tobacco. She reports that she does not drink alcohol and does not use drugs.  No Known Allergies  Family History  Problem Relation Age of Onset   Diabetes Mother    Hypertension Mother    Stroke Father     Prior to Admission medications   Medication Sig Start Date End Date Taking? Authorizing Provider  acetaminophen (TYLENOL) 325 MG tablet Take 650 mg by mouth 3 (three) times daily as needed for moderate pain.    [provider]  amitriptyline (ELAVIL) 25 MG tablet Take 1 tablet (25 mg total) by mouth at bedtime. 10/07/20   Sater, Nanine Means, MD  ANTI-DIARRHEAL 2 MG tablet Take 2 mg by mouth as needed for diarrhea or loose stools.  07/31/19   [provider]  atorvastatin (LIPITOR) 80 MG tablet Take 1 tablet (80 mg total) by mouth daily. Patient taking differently: Take 80 mg by mouth every evening. 02/24/12   Copland, Gay Filler, MD  baclofen (LIORESAL) 20 MG tablet Take 20 mg by mouth 2 (two) times daily.  08/28/19   [provider]  BIOTIN PO Take by mouth.    [provider]  busPIRone (BUSPAR) 15 MG tablet Take 15 mg by mouth 3 (three) times daily. 10/28/19   [provider]  clotrimazole (MYCELEX) 10 MG troche Take 10 mg by mouth 3 (three) times daily as needed (thrush/vaginal yeast infections).  07/03/19   [provider]  dalfampridine 10 MG TB12  One po q12 hours 11/13/20   Sater, Nanine Means, MD  dicyclomine (BENTYL) 20 MG tablet Take 40 mg by mouth in the morning and at bedtime.  10/23/19   [provider]  diphenoxylate-atropine (LOMOTIL) 2.5-0.025 MG per tablet Take 1 tablet by mouth 2 (two) times daily. PATIENT NEEDS OFFICE VISIT FOR ADDITIONAL REFILLS Patient taking differently: Take 1 tablet by mouth in the morning, at noon, and at bedtime. PATIENT NEEDS OFFICE VISIT FOR ADDITIONAL REFILLS 10/18/12   Copland, Gay Filler, MD  DULoxetine (CYMBALTA) 60 MG capsule Take 60 mg by mouth 2 (two) times daily.  10/04/19    [provider]  Echinacea 400 MG CAPS Take 400 mg by mouth in the morning and at bedtime.    [provider]  fluticasone (FLONASE) 50 MCG/ACT nasal spray Place 2 sprays into the nose daily. Patient taking differently: Place 1 spray into the nose in the morning and at bedtime. 02/24/12   Copland, Gay Filler, MD  Homeopathic Products (LEG CRAMPS PO) Take 1-2 tablets by mouth at bedtime.    [provider]  ibuprofen (ADVIL) 800 MG tablet Take 800 mg by mouth 3 (three) times daily as needed for moderate pain.  09/25/19   [provider]  lamoTRIgine (LAMICTAL) 200 MG tablet One po twicw daily 06/13/20   Sater, Nanine Means, MD  losartan (COZAAR) 100 MG tablet Take 1 tablet (100 mg total) by mouth daily. Patient taking differently: Take 100 mg by mouth every evening. 02/24/12   Copland, Gay Filler, MD  MAGNESIUM-OXIDE 400 (241.3 Mg) MG tablet Take 1 tablet by mouth daily. 10/18/19   [provider]  MELATONIN MAXIMUM STRENGTH 5 MG TABS Take 15 mg by mouth at bedtime. 07/31/19   [provider]  montelukast (SINGULAIR) 10 MG tablet Take 10 mg by mouth daily. 07/31/19   [provider]  MUCUS RELIEF 400 MG TABS tablet Take 400 mg by mouth 2 (two) times daily. 07/31/19   [provider]  Multiple Vitamins-Calcium (ONE-A-DAY WOMENS FORMULA) TABS Take 1 tablet by mouth daily. 07/31/19   [provider]  Multiple Vitamins-Minerals (AIRBORNE) CHEW Chew 3 tablets by mouth daily.  07/31/19   [provider]  NARCAN 4 MG/0.1ML LIQD nasal spray kit Place 1 spray into the nose as directed. overdose 10/11/19   [provider]  NIFEdipine (PROCARDIA-XL/ADALAT-CC/NIFEDICAL-XL) 30 MG 24 hr tablet Take 30 mg by mouth daily. 02/24/12   Copland, Gay Filler, MD  NUCYNTA ER 200 MG TB12 Take 1 tablet by mouth 2 (two) times daily. 11/10/19   [provider]  omeprazole (PRILOSEC) 20 MG capsule Take 1 capsule (20 mg total) by mouth  daily. 02/24/12   Copland, Gay Filler, MD  oxyCODONE (ROXICODONE) 15 MG immediate release tablet Take 15 mg by mouth every 6 (six) hours.  09/11/19   [provider]  pantoprazole (PROTONIX) 40 MG tablet Take 40 mg by mouth 2 (two) times daily. 10/09/19   [provider]  predniSONE (DELTASONE) 50 MG tablet 10 pills (500 mg) po qd x 3 days for MS exacerbation 11/13/20   Sater, Nanine Means, MD  TECFIDERA 240 MG CPDR Take 1 capsule by mouth 2 (two) times daily. 10/31/19   [provider]  tiZANidine (ZANAFLEX) 2 MG tablet Take 2 mg by mouth 2 (two) times daily. 10/23/19   [provider]  VIBERZI 100 MG TABS Take 1 tablet by mouth 2 (two) times daily. 11/07/19   [provider]  vitamin B-12 (CYANOCOBALAMIN) 1000 MCG tablet Take 1,000 mcg by mouth daily.  07/31/19   [provider]  XIIDRA 5 % SOLN Place 1 drop into both eyes in the morning and at bedtime.  11/10/19   [provider]  zolpidem (AMBIEN) 10 MG tablet Take 10 mg by mouth at bedtime.  10/28/19   [provider]    Physical Exam: Constitutional: Moderately built and nourished. Vitals:   12/10/20 0515 12/10/20 0530 12/10/20 0545 12/10/20 0600  BP: (!) 150/83 (!) 149/99 (!) 157/83 (!) 164/82  Pulse: (!) 111 (!) 113 (!) 112 (!) 114  Resp: 17 17 16 17   Temp:      TempSrc:      SpO2: 96% 96% 96% 97%  Weight:      Height:       Eyes: Anicteric no pallor. ENMT: No discharge from the ears eyes nose and mouth. Neck: No mass felt.  No neck rigidity. Respiratory: No rhonchi or crepitations. Cardiovascular: S1-S2 heard. Abdomen: Soft nontender bowel sound present. Musculoskeletal: Right leg has swelling and a small ulcer on the anterior shin which is discharging. Skin: Right leg has a small ulcer with discharge. Neurologic: Patient is drowsy arousable oriented to name and place.  Moving all extremities. Psychiatric: Confused.   Labs on Admission: I have personally reviewed  following labs and imaging studies  CBC: Recent Labs  Lab 12/10/20 0300 12/10/20 0357  WBC 7.7  --   NEUTROABS 4.4  --   HGB 14.5 13.6  HCT 41.9 40.0  MCV 88.8  --   PLT 252  --    Basic Metabolic Panel: Recent Labs  Lab 12/10/20 0300 12/10/20 0357  NA 134* 139  K 4.6 4.1  CL 97*  --   CO2 27  --   GLUCOSE 875*  --   BUN 12  --   CREATININE 1.06*  --   CALCIUM 10.4*  --    GFR: Estimated Creatinine Clearance: 72.6 mL/min (A) (by C-G formula based on SCr of 1.06 mg/dL (H)). Liver Function Tests: Recent Labs  Lab 12/10/20 0300  AST 14*  ALT 25  ALKPHOS 84  BILITOT 1.0  PROT 7.2  ALBUMIN 3.8   No results for input(s): LIPASE, AMYLASE in the last 168 hours. Recent Labs  Lab 12/10/20 0300  AMMONIA 21   Coagulation Profile: No results for input(s): INR, PROTIME in the last 168 hours. Cardiac Enzymes: No results for input(s): CKTOTAL, CKMB, CKMBINDEX, TROPONINI in the last 168 hours. BNP (last 3 results) No results for input(s): PROBNP in the last 8760 hours. HbA1C: No results for input(s): HGBA1C in the last 72 hours. CBG: Recent Labs  Lab 12/10/20 0306 12/10/20 0535 12/10/20 0615  GLUCAP >600* 575* 427*   Lipid Profile: No results for input(s): CHOL, HDL, LDLCALC, TRIG, CHOLHDL, LDLDIRECT in the last 72 hours. Thyroid Function Tests: Recent Labs    12/10/20 0300  TSH 1.973   Anemia Panel: No results for input(s): VITAMINB12, FOLATE, FERRITIN, TIBC, IRON, RETICCTPCT in the last 72 hours. Urine analysis:    Component Value Date/Time   COLORURINE STRAW (A) 12/10/2020 0300   APPEARANCEUR CLEAR 12/10/2020 0300   LABSPEC 1.024 12/10/2020 0300   PHURINE 6.0 12/10/2020 0300   GLUCOSEU >=500 (A) 12/10/2020 0300   HGBUR NEGATIVE 12/10/2020 0300   HGBUR negative 12/20/2007 1054   BILIRUBINUR NEGATIVE 12/10/2020 0300   BILIRUBINUR neg 02/24/2012 0905   KETONESUR NEGATIVE 12/10/2020 0300   PROTEINUR NEGATIVE 12/10/2020 0300  UROBILINOGEN 1.0  03/01/2012 1739   NITRITE NEGATIVE 12/10/2020 0300   LEUKOCYTESUR NEGATIVE 12/10/2020 0300   Sepsis Labs: @LABRCNTIP (procalcitonin:4,lacticidven:4) ) Recent Results (from the past 240 hour(s))  Resp Panel by RT-PCR (Flu A&B, Covid) Nasopharyngeal Swab     Status: None   Collection Time: 12/10/20  4:34 AM   Specimen: Nasopharyngeal Swab; Nasopharyngeal(NP) swabs in vial transport medium  Result Value Ref Range Status   SARS Coronavirus 2 by RT PCR NEGATIVE NEGATIVE Final    Comment: (NOTE) SARS-CoV-2 target nucleic acids are NOT DETECTED.  The SARS-CoV-2 RNA is generally detectable in upper respiratory specimens during the acute phase of infection. The lowest concentration of SARS-CoV-2 viral copies this assay can detect is 138 copies/mL. A negative result does not preclude SARS-Cov-2 infection and should not be used as the sole basis for treatment or other patient management decisions. A negative result may occur with  improper specimen collection/handling, submission of specimen other than nasopharyngeal swab, presence of viral mutation(s) within the areas targeted by this assay, and inadequate number of viral copies(<138 copies/mL). A negative result must be combined with clinical observations, patient history, and epidemiological information. The expected result is Negative.  Fact Sheet for Patients:  EntrepreneurPulse.com.au  Fact Sheet for Healthcare Providers:  IncredibleEmployment.be  This test is no t yet approved or cleared by the [place] FDA and  has been authorized for detection and/or diagnosis of SARS-CoV-2 by FDA under an Emergency Use Authorization (EUA). This EUA will remain  in effect (meaning this test can be used) for the duration of the COVID-19 declaration under Section 564(b)(1) of the Act, 21 U.S.C.section 360bbb-3(b)(1), unless the authorization is terminated  or revoked sooner.       Influenza A by PCR  NEGATIVE NEGATIVE Final   Influenza B by PCR NEGATIVE NEGATIVE Final    Comment: (NOTE) The Xpert Xpress SARS-CoV-2/FLU/RSV plus assay is intended as an aid in the diagnosis of influenza from Nasopharyngeal swab specimens and should not be used as a sole basis for treatment. Nasal washings and aspirates are unacceptable for Xpert Xpress SARS-CoV-2/FLU/RSV testing.  Fact Sheet for Patients: EntrepreneurPulse.com.au  Fact Sheet for Healthcare Providers: IncredibleEmployment.be  This test is not yet approved or cleared by the [place] FDA and has been authorized for detection and/or diagnosis of SARS-CoV-2 by FDA under an Emergency Use Authorization (EUA). This EUA will remain in effect (meaning this test can be used) for the duration of the COVID-19 declaration under Section 564(b)(1) of the Act, 21 U.S.C. section 360bbb-3(b)(1), unless the authorization is terminated or revoked.  Performed at [facility], [place] [address] [place], [place] [address]      Radiological Exams on Admission: DG Tibia/Fibula Right  Result Date: 12/10/2020 CLINICAL DATA:  RLE wound EXAM: RIGHT TIBIA AND FIBULA - 2 VIEW COMPARISON:  01/18/2010. FINDINGS: Soft tissue swelling. No acute or focal bony abnormality identified. No evidence of fracture dislocation. Degenerative changes noted about the right knee and ankle. IMPRESSION: Soft tissue swelling. No radiopaque foreign body. No acute bony abnormality. Electronically Signed   By: Marcello Moores  Register   On: 12/10/2020 04:33   CT Head Wo Contrast  Result Date: 12/10/2020 CLINICAL DATA:  60 year old female status post fall. Some confusion. History of multiple sclerosis. EXAM: CT HEAD WITHOUT CONTRAST TECHNIQUE: Contiguous axial images were obtained from the base of the skull through the vertex without intravenous contrast. COMPARISON:  Brain MRI 11/18/2019.  Head CT 02/25/2012. FINDINGS: Brain: Cerebral volume is  within  normal limits for age. No midline shift, ventriculomegaly, mass effect, evidence of mass lesion, intracranial hemorrhage or evidence of cortically based acute infarction. Gray-white matter differentiation is within normal limits for age. No cortical encephalomalacia identified. Vascular: Mild Calcified atherosclerosis at the skull base. No suspicious intracranial vascular hyperdensity. Skull: Stable and intact. Sinuses/Orbits: Visualized paranasal sinuses and mastoids are stable and well aerated. Other: No orbit or scalp soft tissue injury identified. IMPRESSION: Negative for age non contrast CT appearance of the brain. No acute traumatic injury identified. Electronically Signed   By: Genevie Ann M.D.   On: 12/10/2020 05:29   CT Cervical Spine Wo Contrast  Result Date: 12/10/2020 CLINICAL DATA:  60 year old female status post fall. Some confusion. History of multiple sclerosis. EXAM: CT CERVICAL SPINE WITHOUT CONTRAST TECHNIQUE: Multidetector CT imaging of the cervical spine was performed without intravenous contrast. Multiplanar CT image reconstructions were also generated. COMPARISON:  Head CT today reported separately. FINDINGS: Alignment: Straightening of cervical lordosis. Cervicothoracic junction alignment is within normal limits. Bilateral posterior element alignment is within normal limits. Skull base and vertebrae: Visualized skull base is intact. No atlanto-occipital dissociation. C1 and C2 appear intact and aligned. No acute osseous abnormality identified. Soft tissues and spinal canal: No prevertebral fluid or swelling. No visible canal hematoma. Partially retropharyngeal course of both carotids with some calcified atherosclerosis. Otherwise negative visible noncontrast neck soft tissues. Disc levels: Advanced lower cervical disc and endplate degeneration with mild cervical spinal stenosis suspected at both C5-C6 and C6-C7. Possible developing Ossification of the posterior longitudinal ligament  (OPLL). At those levels. Upper chest: Visible upper thoracic levels appear grossly intact. Negative lung apices. IMPRESSION: 1. No acute traumatic injury identified in the cervical spine. 2. 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 Electronically Signed   By: Genevie Ann M.D.   On: 12/10/2020 05:32   DG Chest Portable 1 View  Result Date: 12/10/2020 CLINICAL DATA:  Right lower extremity wound. EXAM: PORTABLE CHEST 1 VIEW COMPARISON:  04/18/2012. FINDINGS: Cardiomegaly with mild bilateral interstitial prominence and tiny bilateral pleural effusions. Findings suggest CHF. Pneumonitis cannot be excluded. No acute bony abnormality. IMPRESSION: Findings suggesting mild CHF. Electronically Signed   By: Marcello Moores  Register   On: 12/10/2020 04:34    EKG: Independently reviewed.  Sinus tachycardia.  Assessment/Plan Principal Problem:   Acute encephalopathy Active Problems:   Multiple sclerosis (HCC)   Essential hypertension   Hyperosmolar non-ketotic state due to type 2 diabetes mellitus ([place])   Leg wound, right, initial encounter    Acute encephalopathy likely secondary to pain medication patient is on Nucynta and oxycodone.  Patient was responding well to Narcan.  Hyperglycemia may be also contributing to patient's symptoms.  Patient is kept n.p.o. and holding off patient's pain medications for now.  Closely monitor respiratory status and mental status. Hyperosmolar nonketotic hyperglycemia -patient's home medication list does not show any antidiabetic medication.  We will check hemoglobin A1c presently on insulin infusion closely follow CBGs and metabolic panel.   Right leg wound with ulceration at discharge for which I have ordered CT of the right leg to check any deep abscess.  We will keep patient on empiric antibiotics.  We will need to  consult wound team.  And probably orthopedics if CT scan shows any deep abscess or bone involvement. Hypertension since patient is n.p.o. I have placed patient on as needed IV  hydralazine. History of multiple sclerosis on Tecfidera which need to be continued once patient can take orally. History of hyperlipidemia on statins.   DVT prophylaxis: Lovenox. Code Status: Full code. Family Communication: Unable to reach patient's family at this time. Disposition Plan: To be determined. Consults called: None. Admission status: Observation.   Rise Patience MD [facility] Pager [phone].  If 7PM-7AM, please contact night-coverage [email or web address] Password [facility]  12/10/2020, 6:35 AM
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
## 2020-12-10 NOTE — Progress Notes (Addendum)
Patient placed in observation after midnight.  Here with AMS and elevated blood sugars.  Plan to transition to SQ insulin and titrate.  Have held sedating medications.  Once off insulin gtt will change bed status to med surge. CT scan shows: Skin thickening and subcutaneous edema diffusely along the right lower extremity as can be seen in cellulitis. Soft tissue defect medially at the level of the mid tibia with adjacent soft tissue gas and swelling but no well-defined fluid collection or findings to suggest underlying osteomyelitis on noncontrast CT.   Marlin Canary DO

## 2020-12-10 NOTE — Progress Notes (Signed)
Pharmacy Antibiotic Note  [name] is a 60 y.o. female admitted on 12/10/2020 with  osteomyelitis .  Pharmacy has been consulted for vancomycin and cefepime dosing.  Plan: Cefepime 2gm IV q8 hours Vancomycin 2500 mg IV x 1 then 1gm IV q12 hours F/u renal function, cultures and clinical course  Height: 5\' 3"  (160 cm) Weight: 125.2 kg (276 lb) IBW/kg (Calculated) : 52.4  Temp (24hrs), Avg:96.7 F (35.9 C), Min:96.7 F (35.9 C), Max:96.7 F (35.9 C)  Recent Labs  Lab 12/10/20 0300  WBC 7.7  CREATININE 1.06*  LATICACIDVEN 1.4    Estimated Creatinine Clearance: 72.6 mL/min (A) (by C-G formula based on SCr of 1.06 mg/dL (H)).    No Known Allergies  Thank you for allowing pharmacy to be a part of this patient's care.  12/12/20 [place] 12/10/2020 6:40 AM

## 2020-12-10 NOTE — ED Notes (Signed)
CBG 320. Endotool said to increase drip to 16. Called pharmacy for instructions wit goal of being off insulin drip. Pharmacy instructed to leave endo tool at 7.5 and give insulin glargine 38 units SubQ.

## 2020-12-10 NOTE — Progress Notes (Signed)
Inpatient Diabetes Program Recommendations  AACE/ADA: New Consensus Statement on Inpatient Glycemic Control (2015)  Target Ranges:  Prepandial:   less than 140 mg/dL      Peak postprandial:   less than 180 mg/dL (1-2 hours)      Critically ill patients:  140 - 180 mg/dL   Lab Results  Component Value Date   GLUCAP 212 (H) 12/10/2020   HGBA1C 5.3 03/01/2012    Review of Glycemic Control  Diabetes history: DM2 Outpatient Diabetes medications: Lantus , Novolog but ? amount doses Current orders for Inpatient glycemic control: IV insulin  Inpatient Diabetes Program Recommendations:   Please consider when ready to transition to IV insulin -Lantus 0.3 units/kg x 125.2 = 38 units (give 2 hrs prior IV insulin discontinued) -Novolog 0-20 units tid + 0-5 units hs ( Cover CBG @ time IV insulin discontinued) -Add Novolog meal coverage when eating regular meals Will follow during hospitalization.  Thank you, Billy Fischer. Lenna Hagarty, RN, MSN, CDE  Diabetes Coordinator Inpatient Glycemic Control Team Team Pager [phone] (8am-5pm) 12/10/2020 10:42 AM

## 2020-12-11 DIAGNOSIS — L03115 Cellulitis of right lower limb: Secondary | ICD-10-CM | POA: Diagnosis present

## 2020-12-11 DIAGNOSIS — G35 Multiple sclerosis: Secondary | ICD-10-CM | POA: Diagnosis present

## 2020-12-11 DIAGNOSIS — Z6841 Body Mass Index (BMI) 40.0 and over, adult: Secondary | ICD-10-CM | POA: Diagnosis not present

## 2020-12-11 DIAGNOSIS — B9562 Methicillin resistant Staphylococcus aureus infection as the cause of diseases classified elsewhere: Secondary | ICD-10-CM | POA: Diagnosis present

## 2020-12-11 DIAGNOSIS — Z8249 Family history of ischemic heart disease and other diseases of the circulatory system: Secondary | ICD-10-CM | POA: Diagnosis not present

## 2020-12-11 DIAGNOSIS — L97812 Non-pressure chronic ulcer of other part of right lower leg with fat layer exposed: Secondary | ICD-10-CM | POA: Diagnosis present

## 2020-12-11 DIAGNOSIS — E870 Hyperosmolality and hypernatremia: Secondary | ICD-10-CM | POA: Diagnosis not present

## 2020-12-11 DIAGNOSIS — Y92013 Bedroom of single-family (private) house as the place of occurrence of the external cause: Secondary | ICD-10-CM | POA: Diagnosis not present

## 2020-12-11 DIAGNOSIS — E11622 Type 2 diabetes mellitus with other skin ulcer: Secondary | ICD-10-CM | POA: Diagnosis present

## 2020-12-11 DIAGNOSIS — E1100 Type 2 diabetes mellitus with hyperosmolarity without nonketotic hyperglycemic-hyperosmolar coma (NKHHC): Secondary | ICD-10-CM | POA: Diagnosis present

## 2020-12-11 DIAGNOSIS — Z20822 Contact with and (suspected) exposure to covid-19: Secondary | ICD-10-CM | POA: Diagnosis present

## 2020-12-11 DIAGNOSIS — R296 Repeated falls: Secondary | ICD-10-CM | POA: Diagnosis present

## 2020-12-11 DIAGNOSIS — G629 Polyneuropathy, unspecified: Secondary | ICD-10-CM | POA: Diagnosis present

## 2020-12-11 DIAGNOSIS — L02415 Cutaneous abscess of right lower limb: Secondary | ICD-10-CM | POA: Diagnosis present

## 2020-12-11 DIAGNOSIS — F17210 Nicotine dependence, cigarettes, uncomplicated: Secondary | ICD-10-CM | POA: Diagnosis present

## 2020-12-11 DIAGNOSIS — G9341 Metabolic encephalopathy: Secondary | ICD-10-CM | POA: Diagnosis not present

## 2020-12-11 DIAGNOSIS — E876 Hypokalemia: Secondary | ICD-10-CM | POA: Diagnosis not present

## 2020-12-11 DIAGNOSIS — G9340 Encephalopathy, unspecified: Secondary | ICD-10-CM | POA: Diagnosis not present

## 2020-12-11 DIAGNOSIS — Z90710 Acquired absence of both cervix and uterus: Secondary | ICD-10-CM | POA: Diagnosis not present

## 2020-12-11 DIAGNOSIS — R739 Hyperglycemia, unspecified: Secondary | ICD-10-CM | POA: Diagnosis present

## 2020-12-11 DIAGNOSIS — Z833 Family history of diabetes mellitus: Secondary | ICD-10-CM | POA: Diagnosis not present

## 2020-12-11 DIAGNOSIS — T402X5A Adverse effect of other opioids, initial encounter: Secondary | ICD-10-CM | POA: Diagnosis present

## 2020-12-11 DIAGNOSIS — S80212A Abrasion, left knee, initial encounter: Secondary | ICD-10-CM | POA: Diagnosis present

## 2020-12-11 DIAGNOSIS — E785 Hyperlipidemia, unspecified: Secondary | ICD-10-CM | POA: Diagnosis present

## 2020-12-11 DIAGNOSIS — W19XXXA Unspecified fall, initial encounter: Secondary | ICD-10-CM | POA: Diagnosis present

## 2020-12-11 DIAGNOSIS — G928 Other toxic encephalopathy: Secondary | ICD-10-CM | POA: Diagnosis present

## 2020-12-11 DIAGNOSIS — Z823 Family history of stroke: Secondary | ICD-10-CM | POA: Diagnosis not present

## 2020-12-11 DIAGNOSIS — I119 Hypertensive heart disease without heart failure: Secondary | ICD-10-CM | POA: Diagnosis present

## 2020-12-11 LAB — BASIC METABOLIC PANEL
Anion gap: 11 (ref 5–15)
BUN: 5 mg/dL — ABNORMAL LOW (ref 6–20)
CO2: 24 mmol/L (ref 22–32)
Calcium: 9 mg/dL (ref 8.9–10.3)
Chloride: 106 mmol/L (ref 98–111)
Creatinine, Ser: 0.73 mg/dL (ref 0.44–1.00)
GFR, Estimated: >60 mL/min (ref >60)
Glucose, Bld: 278 mg/dL — ABNORMAL HIGH (ref 70–99)
Potassium: 3.7 mmol/L (ref 3.5–5.1)
Sodium: 141 mmol/L (ref 135–145)

## 2020-12-11 LAB — GLUCOSE, CAPILLARY
Glucose-Capillary: 291 mg/dL — ABNORMAL HIGH (ref 70–99)
Glucose-Capillary: 308 mg/dL — ABNORMAL HIGH (ref 70–99)
Glucose-Capillary: 362 mg/dL — ABNORMAL HIGH (ref 70–99)
Glucose-Capillary: 415 mg/dL — ABNORMAL HIGH (ref 70–99)
Glucose-Capillary: 429 mg/dL — ABNORMAL HIGH (ref 70–99)

## 2020-12-11 LAB — HEMOGLOBIN A1C
Hgb A1c MFr Bld: 10.6 % — ABNORMAL HIGH (ref 4.8–5.6)
Mean Plasma Glucose: 258 mg/dL

## 2020-12-11 LAB — CBC
HCT: 42.5 % (ref 36.0–46.0)
Hemoglobin: 14.5 g/dL (ref 12.0–15.0)
MCH: 30.7 pg (ref 26.0–34.0)
MCHC: 34.1 g/dL (ref 30.0–36.0)
MCV: 89.9 fL (ref 80.0–100.0)
Platelets: 193 10*3/uL (ref 150–400)
RBC: 4.73 MIL/uL (ref 3.87–5.11)
RDW: 13.1 % (ref 11.5–15.5)
WBC: 9.3 10*3/uL (ref 4.0–10.5)
nRBC: 0 % (ref 0.0–0.2)

## 2020-12-11 MED ORDER — OXYCODONE HCL 5 MG PO TABS
5.0000 mg | ORAL_TABLET | Freq: Four times a day (QID) | ORAL | Status: DC | PRN
  Administered 2020-12-11 – 2020-12-16 (×13): 10 mg via ORAL
  Filled 2020-12-11 (×6): qty 2
  Filled 2020-12-11: qty 1
  Filled 2020-12-11 (×4): qty 2
  Filled 2020-12-11: qty 1
  Filled 2020-12-11 (×2): qty 2

## 2020-12-11 MED ORDER — INSULIN ASPART 100 UNIT/ML IJ SOLN: 8.0000 [IU] | Freq: Three times a day (TID) | INTRAMUSCULAR | Status: DC

## 2020-12-11 MED ORDER — INSULIN STARTER KIT- PEN NEEDLES (ENGLISH)
1.0000 | Freq: Once | Status: AC
  Administered 2020-12-11: 1
  Filled 2020-12-11: qty 1

## 2020-12-11 MED ORDER — DULOXETINE HCL 60 MG PO CPEP
60.0000 mg | ORAL_CAPSULE | Freq: Two times a day (BID) | ORAL | Status: DC
  Administered 2020-12-11 – 2020-12-18 (×15): 60 mg via ORAL
  Filled 2020-12-11 (×15): qty 1

## 2020-12-11 MED ORDER — MELATONIN 5 MG PO TABS
15.0000 mg | ORAL_TABLET | Freq: Every evening | ORAL | Status: DC | PRN
  Administered 2020-12-11 – 2020-12-17 (×4): 15 mg via ORAL
  Filled 2020-12-11 (×4): qty 3

## 2020-12-11 MED ORDER — LIVING WELL WITH DIABETES BOOK
Freq: Once | Status: AC
  Filled 2020-12-11: qty 1

## 2020-12-11 MED ORDER — DIMETHYL FUMARATE 240 MG PO CPDR
1.0000 | DELAYED_RELEASE_CAPSULE | Freq: Two times a day (BID) | ORAL | Status: DC
  Administered 2020-12-12 – 2020-12-18 (×13): 240 mg via ORAL
  Filled 2020-12-11 (×13): qty 1

## 2020-12-11 MED ORDER — INSULIN ASPART 100 UNIT/ML IJ SOLN
0.0000 [IU] | INTRAMUSCULAR | Status: DC
  Administered 2020-12-11 (×2): 20 [IU] via SUBCUTANEOUS
  Administered 2020-12-11: 11 [IU] via SUBCUTANEOUS
  Administered 2020-12-11: 20 [IU] via SUBCUTANEOUS
  Administered 2020-12-12: 15 [IU] via SUBCUTANEOUS
  Administered 2020-12-12: 11 [IU] via SUBCUTANEOUS
  Administered 2020-12-12 (×2): 7 [IU] via SUBCUTANEOUS
  Administered 2020-12-12 – 2020-12-13 (×2): 15 [IU] via SUBCUTANEOUS
  Administered 2020-12-13: 7 [IU] via SUBCUTANEOUS
  Administered 2020-12-13: 4 [IU] via SUBCUTANEOUS
  Administered 2020-12-13: 15 [IU] via SUBCUTANEOUS

## 2020-12-11 MED ORDER — LAMOTRIGINE 100 MG PO TABS
200.0000 mg | ORAL_TABLET | Freq: Two times a day (BID) | ORAL | Status: DC
  Administered 2020-12-11 – 2020-12-18 (×15): 200 mg via ORAL
  Filled 2020-12-11 (×15): qty 2

## 2020-12-11 MED ORDER — INSULIN GLARGINE-YFGN 100 UNIT/ML ~~LOC~~ SOLN
44.0000 [IU] | Freq: Every day | SUBCUTANEOUS | Status: DC
  Administered 2020-12-12: 44 [IU] via SUBCUTANEOUS
  Filled 2020-12-11: qty 0.44

## 2020-12-11 MED ORDER — HYDRALAZINE HCL 25 MG PO TABS
25.0000 mg | ORAL_TABLET | Freq: Four times a day (QID) | ORAL | Status: DC | PRN
  Administered 2020-12-12 – 2020-12-16 (×2): 25 mg via ORAL
  Filled 2020-12-11 (×2): qty 1

## 2020-12-11 MED ORDER — ACETAMINOPHEN 500 MG PO TABS
1000.0000 mg | ORAL_TABLET | Freq: Three times a day (TID) | ORAL | Status: DC | PRN
  Administered 2020-12-11 – 2020-12-16 (×4): 1000 mg via ORAL
  Filled 2020-12-11 (×5): qty 2

## 2020-12-11 MED ORDER — LOSARTAN POTASSIUM 50 MG PO TABS
100.0000 mg | ORAL_TABLET | Freq: Every evening | ORAL | Status: DC
  Administered 2020-12-11 – 2020-12-18 (×8): 100 mg via ORAL
  Filled 2020-12-11 (×8): qty 2

## 2020-12-11 NOTE — Evaluation (Signed)
Physical Therapy Evaluation Patient Details Name: [name] MRN: [id number] DOB: [birth year] Today's Date: 12/11/2020   History of Present Illness  60 yo female who presented with AMS and elevated blood sugars. Given x2 doses Narcan CT. Also noted with R LE cellulitis PMH: DM MS HTn HLD chronic pain  Clinical Impression  Pt was seen for mobility on RW with help to maneuver and balance as needed.  Min guard assist for standing help, and is mainly having issues over RLE weakness and poor sensation on feet.  Pt has stopped driving due to the sensory changes, and is likely hindered from moving as well from flare up of her MS.  Follow up with her to make further strengthening on RLE and corrections of gait part of inpt therapy, and will anticipate home therapy following up for safety and completion of goals.  Focus on balance and safety with cues for compensation of deficits.    Follow Up Recommendations Home health PT;Supervision for mobility/OOB    Equipment Recommendations  Rolling walker with 5" wheels    Recommendations for Other Services       Precautions / Restrictions Precautions Precautions: Fall Restrictions Weight Bearing Restrictions: No      Mobility  Bed Mobility Overal bed mobility: Needs Assistance Bed Mobility: Supine to Sit     Supine to sit: Min guard     General bed mobility comments: close guard to ensure safety    Transfers Overall transfer level: Needs assistance Equipment used: 1 person hand held assist Transfers: Sit to/from Stand;Stand Pivot Transfers Sit to Stand: Min guard Stand pivot transfers: Min guard       General transfer comment: HHA for safety and steadying due to initial eval being done  Ambulation/Gait Ambulation/Gait assistance: Min guard Gait Distance (Feet): 60 Feet (30+20+10) Assistive device: Rolling walker (2 wheeled);1 person hand held assist Gait Pattern/deviations: Step-through pattern;Shuffle;Narrow base of  support Gait velocity: controlled on RW   General Gait Details: pt is more speedy and controlled on RW vs HHA  Stairs            Wheelchair Mobility    Modified Rankin (Stroke Patients Only)       Balance Overall balance assessment: Needs assistance Sitting-balance support: Feet supported Sitting balance-Leahy Scale: Good     Standing balance support: Bilateral upper extremity supported;During functional activity Standing balance-Leahy Scale: Poor Standing balance comment: requires help with HHA or walker, walker more controlled for gait                             Pertinent Vitals/Pain Pain Assessment: 0-10 Pain Score: 9  Pain Location: R > L LE Pain Descriptors / Indicators: Grimacing;Guarding;Tender Pain Intervention(s): Monitored during session;Limited activity within patient's tolerance;Repositioned    Home Living Family/patient expects to be discharged to:: Private residence Living Arrangements: Alone Available Help at Discharge: Family;Available PRN/intermittently Type of Home: House Home Access: Stairs to enter   [employer] of Steps: 2 at back without rails, 4 + 4 at front Home Layout: One level Home Equipment: Walker - 2 wheels;Walker - 4 wheels;Cane - single point;Shower seat      Prior Function Level of Independence: Independent with assistive device(s)         Comments: recent falls and using SPC, but also numb from PN and having MS flare up per pt     Hand Dominance   Dominant Hand: Right    Extremity/Trunk Assessment  Upper Extremity Assessment Upper Extremity Assessment: Defer to OT evaluation    Lower Extremity Assessment Lower Extremity Assessment: RLE deficits/detail RLE Deficits / Details: R leg is 3+ to 4- strength    Cervical / Trunk Assessment Cervical / Trunk Assessment: Normal  Communication   Communication: No difficulties  Cognition Arousal/Alertness: Awake/alert Behavior During Therapy:  WFL for tasks assessed/performed Overall Cognitive Status: Within Functional Limits for tasks assessed                                        General Comments General comments (skin integrity, edema, etc.): pt is on RLE more equally as compared to LLE during gait on RW, noted quick transfer to LLE with no AD    Exercises     Assessment/Plan    PT Assessment Patient needs continued PT services  PT Problem List Decreased strength;Decreased range of motion;Decreased activity tolerance;Decreased mobility;Decreased coordination;Decreased knowledge of use of DME;Decreased safety awareness;Cardiopulmonary status limiting activity;Decreased skin integrity;Pain       PT Treatment Interventions DME instruction;Gait training;Stair training;Functional mobility training;Therapeutic activities;Therapeutic exercise;Balance training;Neuromuscular re-education;Patient/family education    PT Goals (Current goals can be found in the Care Plan section)  Acute Rehab PT Goals Patient Stated Goal: get home when able    Frequency Min 3X/week   Barriers to discharge Inaccessible home environment;Decreased caregiver support      Co-evaluation               AM-PAC PT "6 Clicks" Mobility  Outcome Measure Help needed turning from your back to your side while in a flat bed without using bedrails?: A Little Help needed moving from lying on your back to sitting on the side of a flat bed without using bedrails?: A Little Help needed moving to and from a bed to a chair (including a wheelchair)?: A Little Help needed standing up from a chair using your arms (e.g., wheelchair or bedside chair)?: A Little Help needed to walk in hospital room?: A Little Help needed climbing 3-5 steps with a railing? : A Lot 6 Click Score: 17    End of Session Equipment Utilized During Treatment: Gait belt Activity Tolerance: Patient limited by fatigue;Treatment limited secondary to medical complications  (Comment);Patient limited by pain Patient left: in chair;with call bell/phone within reach;with chair alarm set Nurse Communication: Mobility status;Other (comment) (pain on RLE and PT OT dc plan) PT Visit Diagnosis: Muscle weakness (generalized) (M62.81);Pain Pain - Right/Left: Right Pain - part of body: Leg    Time: 6767-2094 PT Time Calculation (min) (ACUTE ONLY): 22 min   Charges:   PT Evaluation $PT Eval Moderate Complexity: 1 Mod         Ivar Drape 12/11/2020, 2:26 PM  Samul Dada, PT MS Acute Rehab Dept. Number: [facility] [id number] and [facility] [phone]

## 2020-12-11 NOTE — Progress Notes (Signed)
Progress Note    [name]  WGN:[id number] DOB: 04-18-1961  DOA: 12/10/2020 PCP: Felix Pacini, FNP    Brief Narrative:     Medical records reviewed and are as summarized below:  [name] is an 60 y.o. female with history of multiple sclerosis, diabetes mellitus, hypertension, hyperlipidemia and chronic pain was brought to the ER after patient's family felt that patient was getting increasingly confused over the last 24 hours.    Assessment/Plan:   Principal Problem:   Acute encephalopathy Active Problems:   Multiple sclerosis (HCC)   Essential hypertension   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)   Leg wound, right, initial encounter   Hyperglycemia    Acute encephalopathy likely secondary to pain medication/new DM - on Nucynta and oxycodone. - responded well to Narcan.   -resolved  Hyperosmolar nonketotic hyperglycemia/new onset DM -patient's home medication list does not show any antidiabetic medication -SSI and lantus -appreciate diabetic coordinator's help    Right leg wound with ulceration  -WOC -CT scan: Skin thickening and subcutaneous edema diffusely along the right lower extremity as can be seen in cellulitis. Soft tissue defect medially at the level of the mid tibia with adjacent soft tissue gas and swelling but no well-defined fluid collection or findings to suggest underlying osteomyelitis on noncontrast CT. -continue IV abx for now  Hypertension  -resume home meds  History of multiple sclerosis on Tecfidera   Hyperlipidemia -statin   obesity Body mass index is 48.89 kg/m.   Family Communication/Anticipated D/C date and plan/Code Status   DVT prophylaxis: Lovenox ordered. Code Status: Full Code.  Disposition Plan: Status is: Inpatient  Remains inpatient appropriate because:Inpatient level of care appropriate due to severity of illness  Dispo: The patient is from: Home               Anticipated d/c is to: Home              Patient currently is not medically stable to d/c.   Difficult to place patient No         Medical Consultants:   None.   Subjective:   Hungry this AM Prior to admission had polyuria/polydipsia    Objective:    Vitals:   12/10/20 2300 12/11/20 0013 12/11/20 0617 12/11/20 1343  BP: (!) 151/95 (!) 173/101 (!) 179/98 (!) 136/104  Pulse: (!) 107 (!) 108 (!) 106 (!) 112  Resp:  18 18   Temp:  99.2 F (37.3 C) 98.7 F (37.1 C) 99.3 F (37.4 C)  TempSrc:  Oral    SpO2: 93% 100% 96% 94%  Weight:      Height:        Intake/Output Summary (Last 24 hours) at 12/11/2020 1421 Last data filed at 12/10/2020 1555 Gross per 24 hour  Intake 400 ml  Output 800 ml  Net -400 ml   Filed Weights   12/10/20 0500  Weight: 125.2 kg    Exam:  General: Appearance:    Severely obese female in no acute distress     Lungs:      respirations unlabored  Heart:    Tachycardic.   MS:   All extremities are intact. Wound on right shin   Neurologic:   Awake, alert, oriented x 3. No apparent focal neurological           defect.      Data Reviewed:   I have personally reviewed following labs and  imaging studies:  Labs: Labs show the following:   Basic Metabolic Panel: Recent Labs  Lab 12/10/20 0300 12/10/20 0357 12/10/20 0822 12/10/20 1755 12/11/20 0212  NA 134* 139 148* 142 141  K 4.6 4.1 3.6 3.4* 3.7  CL 97*  --  113* 109 106  CO2 27  --  27 27 24   GLUCOSE 875*  --  285* 188* 278*  BUN 12  --  9 5* 5*  CREATININE 1.06*  --  0.72 0.60 0.73  CALCIUM 10.4*  --  9.6 9.5 9.0   GFR Estimated Creatinine Clearance: 96.2 mL/min (by C-G formula based on SCr of 0.73 mg/dL). Liver Function Tests: Recent Labs  Lab 12/10/20 0300  AST 14*  ALT 25  ALKPHOS 84  BILITOT 1.0  PROT 7.2  ALBUMIN 3.8   No results for input(s): LIPASE, AMYLASE in the last 168 hours. Recent Labs  Lab 12/10/20 0300  AMMONIA 21   Coagulation profile No  results for input(s): INR, PROTIME in the last 168 hours.  CBC: Recent Labs  Lab 12/10/20 0300 12/10/20 0357 12/10/20 0822 12/11/20 0212  WBC 7.7  --  10.6* 9.3  NEUTROABS 4.4  --   --   --   HGB 14.5 13.6 13.3 14.5  HCT 41.9 40.0 39.6 42.5  MCV 88.8  --  89.4 89.9  PLT 252  --  237 193   Cardiac Enzymes: No results for input(s): CKTOTAL, CKMB, CKMBINDEX, TROPONINI in the last 168 hours. BNP (last 3 results) No results for input(s): PROBNP in the last 8760 hours. CBG: Recent Labs  Lab 12/10/20 1529 12/10/20 1637 12/10/20 2135 12/11/20 0942 12/11/20 1126  GLUCAP 277* 227* 229* 415* 429*   D-Dimer: No results for input(s): DDIMER in the last 72 hours. Hgb A1c: Recent Labs    12/10/20 0822  HGBA1C 10.6*   Lipid Profile: No results for input(s): CHOL, HDL, LDLCALC, TRIG, CHOLHDL, LDLDIRECT in the last 72 hours. Thyroid function studies: Recent Labs    12/10/20 0300  TSH 1.973   Anemia work up: No results for input(s): VITAMINB12, FOLATE, FERRITIN, TIBC, IRON, RETICCTPCT in the last 72 hours. Sepsis Labs: Recent Labs  Lab 12/10/20 0300 12/10/20 0822 12/11/20 0212  WBC 7.7 10.6* 9.3  LATICACIDVEN 1.4  --   --     Microbiology Recent Results (from the past 240 hour(s))  Blood culture (routine x 2)     Status: None (Preliminary result)   Collection Time: 12/10/20  3:10 AM   Specimen: BLOOD  Result Value Ref Range Status   Specimen Description BLOOD LEFT ANTECUBITAL  Final   Special Requests   Final    BOTTLES DRAWN AEROBIC AND ANAEROBIC Blood Culture adequate volume   Culture   Final    NO GROWTH 1 DAY Performed at [facility], [address] [place], [place] [address]    Report Status PENDING  Incomplete  Blood culture (routine x 2)     Status: None (Preliminary result)   Collection Time: 12/10/20  3:10 AM   Specimen: BLOOD  Result Value Ref Range Status   Specimen Description BLOOD RIGHT ANTECUBITAL  Final   Special Requests   Final     BOTTLES DRAWN AEROBIC AND ANAEROBIC Blood Culture adequate volume   Culture   Final    NO GROWTH 1 DAY Performed at [facility], [address] [place], [place] [address]    Report Status PENDING  Incomplete  Resp Panel by RT-PCR (  Flu A&B, Covid) Nasopharyngeal Swab     Status: None   Collection Time: 12/10/20  4:34 AM   Specimen: Nasopharyngeal Swab; Nasopharyngeal(NP) swabs in vial transport medium  Result Value Ref Range Status   SARS Coronavirus 2 by RT PCR NEGATIVE NEGATIVE Final    Comment: (NOTE) SARS-CoV-2 target nucleic acids are NOT DETECTED.  The SARS-CoV-2 RNA is generally detectable in upper respiratory specimens during the acute phase of infection. The lowest concentration of SARS-CoV-2 viral copies this assay can detect is 138 copies/mL. A negative result does not preclude SARS-Cov-2 infection and should not be used as the sole basis for treatment or other patient management decisions. A negative result may occur with  improper specimen collection/handling, submission of specimen other than nasopharyngeal swab, presence of viral mutation(s) within the areas targeted by this assay, and inadequate number of viral copies(<138 copies/mL). A negative result must be combined with clinical observations, patient history, and epidemiological information. The expected result is Negative.  Fact Sheet for Patients:  BloggerCourse.com  Fact Sheet for Healthcare Providers:  SeriousBroker.it  This test is no t yet approved or cleared by the [place] FDA and  has been authorized for detection and/or diagnosis of SARS-CoV-2 by FDA under an Emergency Use Authorization (EUA). This EUA will remain  in effect (meaning this test can be used) for the duration of the COVID-19 declaration under Section 564(b)(1) of the Act, 21 U.S.C.section 360bbb-3(b)(1), unless the authorization is terminated  or revoked sooner.        Influenza A by PCR NEGATIVE NEGATIVE Final   Influenza B by PCR NEGATIVE NEGATIVE Final    Comment: (NOTE) The Xpert Xpress SARS-CoV-2/FLU/RSV plus assay is intended as an aid in the diagnosis of influenza from Nasopharyngeal swab specimens and should not be used as a sole basis for treatment. Nasal washings and aspirates are unacceptable for Xpert Xpress SARS-CoV-2/FLU/RSV testing.  Fact Sheet for Patients: BloggerCourse.com  Fact Sheet for Healthcare Providers: SeriousBroker.it  This test is not yet approved or cleared by the [place] FDA and has been authorized for detection and/or diagnosis of SARS-CoV-2 by FDA under an Emergency Use Authorization (EUA). This EUA will remain in effect (meaning this test can be used) for the duration of the COVID-19 declaration under Section 564(b)(1) of the Act, 21 U.S.C. section 360bbb-3(b)(1), unless the authorization is terminated or revoked.  Performed at [facility], [address], [place], [place] [address]     Procedures and diagnostic studies:  DG Tibia/Fibula Right  Result Date: 12/10/2020 CLINICAL DATA:  RLE wound EXAM: RIGHT TIBIA AND FIBULA - 2 VIEW COMPARISON:  01/18/2010. FINDINGS: Soft tissue swelling. No acute or focal bony abnormality identified. No evidence of fracture dislocation. Degenerative changes noted about the right knee and ankle. IMPRESSION: Soft tissue swelling. No radiopaque foreign body. No acute bony abnormality. Electronically Signed   By: Maisie Fus  Register   On: 12/10/2020 04:33   CT Head Wo Contrast  Result Date: 12/10/2020 CLINICAL DATA:  60 year old female status post fall. Some confusion. History of multiple sclerosis. EXAM: CT HEAD WITHOUT CONTRAST TECHNIQUE: Contiguous axial images were obtained from the base of the skull through the vertex without intravenous contrast. COMPARISON:  Brain MRI 11/18/2019.  Head CT 02/25/2012. FINDINGS: Brain:  Cerebral volume is within normal limits for age. No midline shift, ventriculomegaly, mass effect, evidence of mass lesion, intracranial hemorrhage or evidence of cortically based acute infarction. Gray-white matter differentiation is within normal limits for age. No cortical encephalomalacia  identified. Vascular: Mild Calcified atherosclerosis at the skull base. No suspicious intracranial vascular hyperdensity. Skull: Stable and intact. Sinuses/Orbits: Visualized paranasal sinuses and mastoids are stable and well aerated. Other: No orbit or scalp soft tissue injury identified. IMPRESSION: Negative for age non contrast CT appearance of the brain. No acute traumatic injury identified. Electronically Signed   By: Odessa Fleming M.D.   On: 12/10/2020 05:29   CT Cervical Spine Wo Contrast  Result Date: 12/10/2020 CLINICAL DATA:  60 year old female status post fall. Some confusion. History of multiple sclerosis. EXAM: CT CERVICAL SPINE WITHOUT CONTRAST TECHNIQUE: Multidetector CT imaging of the cervical spine was performed without intravenous contrast. Multiplanar CT image reconstructions were also generated. COMPARISON:  Head CT today reported separately. FINDINGS: Alignment: Straightening of cervical lordosis. Cervicothoracic junction alignment is within normal limits. Bilateral posterior element alignment is within normal limits. Skull base and vertebrae: Visualized skull base is intact. No atlanto-occipital dissociation. C1 and C2 appear intact and aligned. No acute osseous abnormality identified. Soft tissues and spinal canal: No prevertebral fluid or swelling. No visible canal hematoma. Partially retropharyngeal course of both carotids with some calcified atherosclerosis. Otherwise negative visible noncontrast neck soft tissues. Disc levels: Advanced lower cervical disc and endplate degeneration with mild cervical spinal stenosis suspected at both C5-C6 and C6-C7. Possible developing Ossification of the posterior  longitudinal ligament (OPLL). At those levels. Upper chest: Visible upper thoracic levels appear grossly intact. Negative lung apices. IMPRESSION: 1. No acute traumatic injury identified in the cervical spine. 2. 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 Electronically Signed   By: Odessa Fleming M.D.   On: 12/10/2020 05:32   CT TIBIA FIBULA RIGHT WO CONTRAST  Result Date: 12/10/2020 CLINICAL DATA:  Osteomyelitis suspected, tib/fib, xray done EXAM: CT OF THE LOWER RIGHT EXTREMITY WITHOUT CONTRAST TECHNIQUE: Multidetector CT imaging of the right lower extremity was performed according to the standard protocol. COMPARISON:  Same-day radiograph FINDINGS: Bones/Joint/Cartilage Fracture there is no acute fracture. There is no evidence of bone erosion or destruction. There is tricompartment osteoarthritis of the right knee, with moderate medial and severe patellofemoral joint space narrowing. There is a small right knee joint effusion. Plantar calcaneal spurring. 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 Ligaments Suboptimally assessed by CT. Muscles and Tendons There is no muscle atrophy. There is no evidence of intramuscular fluid collection on noncontrast CT. Insertional Achilles tendinosis with intra tendinous calcification. Soft tissues Skin thickening and subcutaneous soft tissue swelling diffusely along the lower extremity. There is a soft tissue defect medially at the level of the mid tibia with adjacent swelling and soft tissue gas but no well-defined collection. IMPRESSION: Skin thickening and subcutaneous edema diffusely along the right lower extremity as can be seen in cellulitis. Soft tissue defect medially at the level of the mid tibia with adjacent soft tissue gas and swelling but no well-defined fluid collection or findings to  suggest underlying osteomyelitis on noncontrast CT. No evidence of deep intramuscular compartment involvement on noncontrast CT. Electronically Signed   By: Caprice Renshaw   On: 12/10/2020 07:26   DG Chest Portable 1 View  Result Date: 12/10/2020 CLINICAL DATA:  Right lower extremity wound. EXAM: PORTABLE CHEST 1 VIEW COMPARISON:  04/18/2012. FINDINGS: Cardiomegaly with mild bilateral interstitial prominence and tiny bilateral pleural effusions. Findings suggest CHF. Pneumonitis cannot be excluded. No acute bony abnormality. IMPRESSION: Findings suggesting mild  CHF. Electronically Signed   By: Maisie Fus  Register   On: 12/10/2020 04:34    Medications:    DULoxetine  60 mg Oral BID   enoxaparin (LOVENOX) injection  40 mg Subcutaneous Q24H   insulin aspart  0-20 Units Subcutaneous Q4H   [START ON 12/12/2020] insulin glargine-yfgn  44 Units Subcutaneous Daily   lamoTRIgine  200 mg Oral BID   losartan  100 mg Oral QPM   Continuous Infusions:  ceFEPime (MAXIPIME) IV 2 g (12/11/20 0958)   vancomycin Stopped (12/10/20 1918)     LOS: 0 days   Joseph Art  [facility]   How to contact the [facility] Attending or Consulting provider 7A - 7P or covering provider during after hours 7P -7A, for this patient?  Check the care team in [facility] and look for a) attending/consulting [facility] provider listed and b) the [facility] team listed Log into [email or web address] and use [facility]'s universal password to access. If you do not have the password, please contact the hospital operator. Locate the [facility] provider you are looking for under [facility] and page to a number that you can be directly reached. If you still have difficulty reaching the provider, please page the [facility] (Director on Call) for the Hospitalists listed on amion for assistance.  12/11/2020, 2:21 PM

## 2020-12-11 NOTE — Progress Notes (Addendum)
Inpatient Diabetes Program Recommendations  AACE/ADA: New Consensus Statement on Inpatient Glycemic Control   Target Ranges:  Prepandial:   less than 140 mg/dL      Peak postprandial:   less than 180 mg/dL (1-2 hours)      Critically ill patients:  140 - 180 mg/dL  Results for [name], [name] (MRN [id number]) as of 12/11/2020 09:43  Ref. Range 12/11/2020 09:42  Glucose-Capillary Latest Ref Range: 70 - 99 mg/dL 415 (H)   Results for [name], [name] (MRN [id number]) as of 12/11/2020 09:39  Ref. Range 12/10/2020 08:17 12/10/2020 09:29 12/10/2020 11:42 12/10/2020 14:00 12/10/2020 15:29 12/10/2020 16:37 12/10/2020 21:35  Glucose-Capillary Latest Ref Range: 70 - 99 mg/dL 307 (H) 212 (H) 232 (H) 320 (H) 277 (H) 227 (H) 229 (H)  Results for [name], [name] (MRN [id number]) as of 12/11/2020 09:39  Ref. Range 12/10/2020 03:00 12/10/2020 03:11  Beta-Hydroxybutyric Acid Latest Ref Range: 0.05 - 0.27 mmol/L  0.57 (H)  Glucose Latest Ref Range: 70 - 99 mg/dL [facility])   Results for [name], [name] (MRN [id number]) as of 12/11/2020 09:43  Ref. Range 12/10/2020 08:22  Hemoglobin A1C Latest Ref Range: 4.8 - 5.6 % 10.6 (H)   Review of Glycemic Control  Diabetes history: DM2 Outpatient Diabetes medications: None Current orders for Inpatient glycemic control: Semglee 38 units daily, Novolog 0-15 units TID with meals, Novolog 0-5 units QHS  Inpatient Diabetes Program Recommendations:    Insulin: Please consider increasing Novolog to 0-20 units and changing to Q4H frequency. Also, please consider increasing Semglee to 44 units daily.   HbgA1C:  A1C 10.6% on 12/10/20 indicating an average glucose of 258 over the past 2-3 months. Patient will need to be discharged on DM medication. Patient is agreeable to take insulin (prefers insulin pens) and/or oral DM medications (does not want to use Metformin) if prescribed at discharge.   NOTE: Per H&P, patient has DM2 hx and not currently on any DM meds outpatient. Also noted patient has  multiple sclerosis(MS), increasing confused over last 24 hours, and recently on steroids for MS exacerbation. Initial glucose 875 mg/dl on 12/10/20 and patient was initially ordered IV insulin which was transitioned to SQ insulin yesterday (received Semglee 38 units at 14:21 on 12/10/20). Will plan to speak with patient today.  Addendum 12/11/20_0 :33-Spoke with patient about A1C and diabetes hx. Patient reports that she was dx with DM over 12-13 years ago and she had been on insulin (vial and syringe) and Metformin in the past. Patient reports that she made changes and was able to come off all DM medications and she has not taken any DM medication in many years. Patient states that her PCP checks her A1C routinely and when she seen PCP last month, she reports she was told that her A1C was improved and in a good range. Patient notes that she got 3 rounds of IV Solumedrol and 2 rounds of oral steroids in June due to MS exacerbation.   Patient states that she does not check glucose at home and does not have any testing supplies. Discussed initial glucose of 875 mg/dl on 12/10/20 and A1C results (10.6% on 12/10/20) and explained that current A1C indicates an average glucose of 258 mg/dl over the past 2-3 months. Discussed glucose and A1C goals. Discussed importance of checking CBGs and maintaining good CBG control to prevent long-term and short-term complications.  Stressed to the patient the importance of improving glycemic control to prevent further complications from uncontrolled diabetes. Discussed impact  of nutrition, exercise, stress, sickness, and medications on diabetes control.  Discussed carbohydrates, carbohydrate goals per day and meal, along with portion sizes. Patient reports she mainly drinks water, juice (32 oz) and sips on ginger ale when stomach is upset. Encouraged patient to eliminate sugary beverages if possible. Encouraged patient to check glucose 3-4 times per day (before meals and at bedtime) and  to keep a log book of glucose readings and DM medication taken which patient will need to take to doctor appointments. Explained how the doctor can use the log book to continue to make adjustments with DM medications if needed. Explained that she may need to be discharged on insulin and/or oral DM medications. Patient is agreeable to take insulin and/or oral DM medications if needed. Patient states that she does not want to use Metformin and if she is prescribed insulin, she would prefer insulin pens. Educated patient on insulin pen use at home.  Reviewed all steps of insulin pen including attachment of needle, 2-unit air shot, dialing up dose, giving injection, removing needle, disposal of sharps, storage of unused insulin, disposal of insulin etc. Patient able to provide successful return demonstration. Informed patient that a Living Well with DM and an insulin starter kit would be ordered. Asked that she review both once they are received.  Patient verbalized understanding of information discussed and reports no further questions at this time related to diabetes. Will need Rx for glucose monitoring kit and DM medications (if insulin prescribed please provide Rx for insulin pens and insulin pen needles).  Thanks, Barnie Alderman, RN, MSN, CDE Diabetes Coordinator Inpatient Diabetes Program [phone] (Team Pager from 8am to 5pm)

## 2020-12-11 NOTE — Evaluation (Signed)
Occupational Therapy Evaluation Patient Details Name: [name] MRN: [id number] DOB: 1960-06-24 Today's Date: 12/11/2020    History of Present Illness 60 yo female who presented with AMS and elevated blood sugars. Given x2 doses Narcan CT. Also noted with R LE cellulitis PMH: DM MS HTn HLD chronic pain   Clinical Impression   PTA, pt lives alone and reports Modified Independence with ADLs, IADLs in the home and mobility using SPC. Pt reports recent hx of falls though pt unsure what caused them. Pt able to mobilize in room with min guard via handheld assist and RW. Pt appears to be more steady with RW use at this time. Pt requires Setup for UB ADLs and Mod A for LB ADLs (more limited by reported LE pain). Anticipate pt to progress well at acute level and return home with intermittent family assist. Recommend HHOT follow-up at [place].     Follow Up Recommendations  Home health OT;Supervision - Intermittent    Equipment Recommendations  None recommended by OT (appears well equipped)    Recommendations for Other Services       Precautions / Restrictions Precautions Precautions: Fall Restrictions Weight Bearing Restrictions: No      Mobility Bed Mobility Overal bed mobility: Needs Assistance Bed Mobility: Supine to Sit     Supine to sit: Supervision;HOB elevated     General bed mobility comments: no physical assist needed    Transfers Overall transfer level: Needs assistance Equipment used: 1 person hand held assist;Rolling walker (2 wheeled) Transfers: Sit to/from [employer] Sit to Stand: Min guard Stand pivot transfers: Min guard       General transfer comment: sit to stand at bedside with handheld assist and for initial mobility. trialed RW with improved stability in mobility and turning to recliner chair with min guard    Balance Overall balance assessment: Needs assistance Sitting-balance support: No upper extremity supported;Feet  supported Sitting balance-Leahy Scale: Good     Standing balance support: Single extremity supported;Bilateral upper extremity supported;During functional activity Standing balance-Leahy Scale: Poor Standing balance comment: reliant on at least one UE support with mobility                           ADL either performed or assessed with clinical judgement   ADL Overall ADL's : Needs assistance/impaired Eating/Feeding: Independent;Sitting   Grooming: Min guard;Standing   Upper Body Bathing: Set up;Sitting   Lower Body Bathing: Minimal assistance;Sit to/from stand   Upper Body Dressing : Set up;Sitting   Lower Body Dressing: Moderate assistance;Sit to/from stand;Sitting/lateral leans Lower Body Dressing Details (indicate cue type and reason): assist to don socks due to LE discomfort Toilet Transfer: Min guard;Ambulation   Toileting- Clothing Manipulation and Hygiene: Min guard;Sit to/from stand       Functional mobility during ADLs: Min guard;Rolling walker General ADL Comments: Pt with deficits in LE pain but able to demo mobility, standing with tasks though limited. Trialed with handheld assist but pt appears more steady with RW at this time.     Vision Patient Visual Report: No change from baseline Vision Assessment?: No apparent visual deficits     Perception     Praxis      Pertinent Vitals/Pain Pain Assessment: 0-10 Pain Score: 9  Pain Location: LE (R >L) Pain Descriptors / Indicators: Discomfort;Sore;Grimacing;Guarding Pain Intervention(s): Monitored during session;Premedicated before session     Hand Dominance Right   Extremity/Trunk Assessment Upper Extremity Assessment Upper Extremity  Assessment: Generalized weakness   Lower Extremity Assessment Lower Extremity Assessment: Defer to PT evaluation   Cervical / Trunk Assessment Cervical / Trunk Assessment: Normal   Communication Communication Communication: No difficulties   Cognition  Arousal/Alertness: Awake/alert Behavior During Therapy: WFL for tasks assessed/performed Overall Cognitive Status: Within Functional Limits for tasks assessed                                     General Comments       Exercises     Shoulder Instructions      Home Living Family/patient expects to be discharged to:: Private residence Living Arrangements: Alone Available Help at Discharge: Family;Available PRN/intermittently Type of Home: House Home Access: Stairs to enter [employer] of Steps: 2 at back without rails, 4 + 4 at front   Home Layout: One level     Bathroom Shower/Tub: Chief Strategy Officer: Standard     Home Equipment: Environmental consultant - 2 wheels;Walker - 4 wheels;Cane - single point;Shower seat          Prior Functioning/Environment Level of Independence: Independent with assistive device(s)        Comments: reports typically using SPC with mobility. Modified Independent with ADLs, IADLs in the home, grocery shopping. daughter provides transportation due to pt neuropathy. Reports recent hx of falls with unknown origin        OT Problem List: Decreased strength;Decreased activity tolerance;Impaired balance (sitting and/or standing);Pain      OT Treatment/Interventions: Self-care/ADL training;Therapeutic exercise;Energy conservation;DME and/or AE instruction;Therapeutic activities;Patient/family education;Balance training    OT Goals(Current goals can be found in the care plan section) Acute Rehab OT Goals Patient Stated Goal: resolve pain/issues, be able to go home when ready OT Goal Formulation: With patient Time For Goal Achievement: 12/25/20 Potential to Achieve Goals: Good  OT Frequency: Min 2X/week   Barriers to D/C:            Co-evaluation              AM-PAC OT "6 Clicks" Daily Activity     Outcome Measure Help from another person eating meals?: None Help from another person taking care of personal  grooming?: A Little Help from another person toileting, which includes using toliet, bedpan, or urinal?: A Little Help from another person bathing (including washing, rinsing, drying)?: A Little Help from another person to put on and taking off regular upper body clothing?: A Little Help from another person to put on and taking off regular lower body clothing?: A Little 6 Click Score: 19   End of Session Equipment Utilized During Treatment: Gait belt;Rolling walker Nurse Communication: Mobility status  Activity Tolerance: Patient tolerated treatment well Patient left: in chair;with call bell/phone within reach;with chair alarm set  OT Visit Diagnosis: Unsteadiness on feet (R26.81);Other abnormalities of gait and mobility (R26.89);Muscle weakness (generalized) (M62.81);Pain Pain - Right/Left: Right Pain - part of body: Leg                Time: 1221-1240 OT Time Calculation (min): 19 min Charges:  OT General Charges $OT Visit: 1 Visit OT Evaluation $OT Eval Low Complexity: 1 Low  Bradd Canary, OTR/L Acute Rehab Services Office: [phone]   Lorre Munroe 12/11/2020, 1:07 PM

## 2020-12-12 DIAGNOSIS — G9340 Encephalopathy, unspecified: Secondary | ICD-10-CM | POA: Diagnosis not present

## 2020-12-12 LAB — BASIC METABOLIC PANEL
Anion gap: 7 (ref 5–15)
BUN: 7 mg/dL (ref 6–20)
CO2: 26 mmol/L (ref 22–32)
Calcium: 9.2 mg/dL (ref 8.9–10.3)
Chloride: 102 mmol/L (ref 98–111)
Creatinine, Ser: 0.62 mg/dL (ref 0.44–1.00)
GFR, Estimated: >60 mL/min (ref >60)
Glucose, Bld: 227 mg/dL — ABNORMAL HIGH (ref 70–99)
Potassium: 2.9 mmol/L — ABNORMAL LOW (ref 3.5–5.1)
Sodium: 135 mmol/L (ref 135–145)

## 2020-12-12 LAB — GLUCOSE, CAPILLARY
Glucose-Capillary: 237 mg/dL — ABNORMAL HIGH (ref 70–99)
Glucose-Capillary: 238 mg/dL — ABNORMAL HIGH (ref 70–99)
Glucose-Capillary: 273 mg/dL — ABNORMAL HIGH (ref 70–99)
Glucose-Capillary: 274 mg/dL — ABNORMAL HIGH (ref 70–99)
Glucose-Capillary: 301 mg/dL — ABNORMAL HIGH (ref 70–99)
Glucose-Capillary: 302 mg/dL — ABNORMAL HIGH (ref 70–99)
Glucose-Capillary: 310 mg/dL — ABNORMAL HIGH (ref 70–99)

## 2020-12-12 LAB — MAGNESIUM: Magnesium: 1.8 mg/dL (ref 1.7–2.4)

## 2020-12-12 MED ORDER — INSULIN GLARGINE-YFGN 100 UNIT/ML ~~LOC~~ SOLN
48.0000 [IU] | Freq: Every day | SUBCUTANEOUS | Status: DC
  Administered 2020-12-13 – 2020-12-15 (×3): 48 [IU] via SUBCUTANEOUS
  Filled 2020-12-12 (×4): qty 0.48

## 2020-12-12 MED ORDER — DICYCLOMINE HCL 20 MG PO TABS
20.0000 mg | ORAL_TABLET | Freq: Two times a day (BID) | ORAL | Status: DC
  Administered 2020-12-12 – 2020-12-18 (×13): 20 mg via ORAL
  Filled 2020-12-12 (×15): qty 1

## 2020-12-12 MED ORDER — DALFAMPRIDINE ER 10 MG PO TB12
10.0000 mg | ORAL_TABLET | Freq: Two times a day (BID) | ORAL | Status: DC
  Administered 2020-12-12 – 2020-12-18 (×13): 10 mg via ORAL
  Filled 2020-12-12 (×13): qty 1

## 2020-12-12 MED ORDER — ARTIFICIAL TEARS OPHTHALMIC OINT
TOPICAL_OINTMENT | Freq: Two times a day (BID) | OPHTHALMIC | Status: DC
  Administered 2020-12-12: 2 via OPHTHALMIC
  Administered 2020-12-15 – 2020-12-16 (×2): 1 via OPHTHALMIC
  Filled 2020-12-12: qty 3.5

## 2020-12-12 MED ORDER — LOPERAMIDE HCL 2 MG PO CAPS: 2.0000 mg | ORAL_CAPSULE | ORAL | Status: DC | PRN

## 2020-12-12 MED ORDER — AMITRIPTYLINE HCL 50 MG PO TABS
25.0000 mg | ORAL_TABLET | Freq: Every day | ORAL | Status: DC
  Administered 2020-12-12 – 2020-12-17 (×6): 25 mg via ORAL
  Filled 2020-12-12 (×6): qty 1

## 2020-12-12 MED ORDER — POTASSIUM CHLORIDE CRYS ER 20 MEQ PO TBCR
40.0000 meq | EXTENDED_RELEASE_TABLET | ORAL | Status: AC
  Administered 2020-12-12 (×3): 40 meq via ORAL
  Filled 2020-12-12 (×3): qty 2

## 2020-12-12 MED ORDER — BUSPIRONE HCL 10 MG PO TABS
15.0000 mg | ORAL_TABLET | Freq: Three times a day (TID) | ORAL | Status: DC
  Administered 2020-12-12 – 2020-12-18 (×20): 15 mg via ORAL
  Filled 2020-12-12 (×20): qty 2

## 2020-12-12 MED ORDER — ELUXADOLINE 100 MG PO TABS
1.0000 | ORAL_TABLET | Freq: Two times a day (BID) | ORAL | Status: DC
  Administered 2020-12-12 – 2020-12-18 (×13): 100 mg via ORAL
  Filled 2020-12-12 (×12): qty 1

## 2020-12-12 MED ORDER — AMOXICILLIN-POT CLAVULANATE 875-125 MG PO TABS
1.0000 | ORAL_TABLET | Freq: Two times a day (BID) | ORAL | Status: DC
Start: 2020-12-12 — End: 2020-12-14
  Administered 2020-12-12 – 2020-12-14 (×5): 1 via ORAL
  Filled 2020-12-12 (×5): qty 1

## 2020-12-12 MED ORDER — TIZANIDINE HCL 2 MG PO TABS
2.0000 mg | ORAL_TABLET | Freq: Four times a day (QID) | ORAL | Status: DC
  Administered 2020-12-12 – 2020-12-18 (×24): 2 mg via ORAL
  Filled 2020-12-12 (×30): qty 1

## 2020-12-12 NOTE — TOC Initial Note (Signed)
Transition of Care [facility]) - Initial/Assessment Note    Patient Details  Name: [name] MRN: [id number] Date of Birth: [birth year]  [facility]) CM/SW Contact:    Bethann Berkshire, [place] Phone Number: 12/12/2020, 3:42 PM  Clinical Narrative:                  CSW met with pt to discuss disposition. Pt lives in [place] home alone. Her daughter is local. She reports having all the DME she needs including 3-in-1, walker, and electric scooter. Pt is agreeable to [facility] PT but states she does not want OT. She wants in aide if possible but agreeable if unable to obtain aide.   CSW arranged HHPT with [facility] with start of care being first of next week. They do not have aide services available. Pt is agreeable to this plan.   Expected Discharge Plan: [place] Barriers to Discharge: Continued Medical Work up   Patient Goals and CMS Choice        Expected Discharge Plan and Services Expected Discharge Plan: [place]       Living arrangements for the past 2 months: Single Family Home                                      Prior Living Arrangements/Services Living arrangements for the past 2 months: Single Family Home Lives with:: Self          Need for Family Participation in Patient Care: No (Comment) Care giver support system in place?: No (comment) Current home services: DME    Activities of Daily Living      Permission Sought/Granted                  Emotional Assessment Appearance:: Appears stated age Attitude/Demeanor/Rapport: Engaged Affect (typically observed): Accepting Orientation: : Oriented to Self, Oriented to Place, Oriented to  Time, Oriented to Situation Alcohol / Substance Use: Not Applicable Psych Involvement: No (comment)  Admission diagnosis:  Somnolence [R40.0] Hyperglycemia [R73.9] Acute encephalopathy [G93.40] Hyperosmolar non-ketotic state due to type 2 diabetes mellitus ([place])  [E11.00] Patient Active Problem List   Diagnosis Date Noted   Acute encephalopathy 12/10/2020   Hyperosmolar non-ketotic state due to type 2 diabetes mellitus ([place]) 12/10/2020   Leg wound, right, initial encounter 12/10/2020   Hyperglycemia 12/10/2020   Essential tremor 07/15/2020   Gait disturbance 06/13/2020   High risk medication use 12/08/2019   Left carpal tunnel syndrome 08/19/2012   Depression with anxiety 08/19/2012   Lumbar spondylosis 03/07/2012   Osteoarthrosis, unspecified whether generalized or localized, lower leg 03/07/2012   Diabetic peripheral neuropathy ([place]) 03/07/2012   SMOKER 06/02/2010   SIMPLE CHRONIC BRONCHITIS 06/02/2010   DYSPNEA 06/02/2010   SKIN RASH 08/05/2009   ACUTE CYSTITIS 05/03/2009   DIABETES MELLITUS 02/18/2009   INSOMNIA 02/12/2009   MORBID OBESITY 11/28/2008   LIVER FUNCTION TESTS, ABNORMAL, HX OF 11/28/2008   DEGENERATION INTERVERTEBRAL DISC SITE UNSPEC 07/02/2008   Other chronic pain 05/22/2008   BLURRED VISION 05/22/2008   ESOPHAGEAL REFLUX 01/10/2008   Multiple sclerosis ([place]) 12/09/2007   URINARY RETENTION 12/09/2007   TACHYCARDIA 11/17/2007   Essential hypertension 11/08/2007   PCP:  Beverley Fiedler, FNP Pharmacy:   [id number] - [place], [place] - [address] [place]  [place] [address] Phone: [phone] Fax: [phone]     Social Determinants of Health (SDOH) Interventions    Readmission Risk Interventions No flowsheet data found.

## 2020-12-12 NOTE — Progress Notes (Signed)
Progress Note    [name]  HDQ:[id number] DOB: 06-12-1960  DOA: 12/10/2020 PCP: Felix Pacini, FNP    Brief Narrative:     Medical records reviewed and are as summarized below:  [name] is an 60 y.o. female with history of multiple sclerosis, diabetes mellitus, hypertension, hyperlipidemia and chronic pain was brought to the ER after patient's family felt that patient was getting increasingly confused over the last 24 hours.  Found to have new onset DM.     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   -resolved  Hyperosmolar nonketotic hyperglycemia/new onset DM -patient's home medication list does not show any antidiabetic medication -SSI and lantus -appreciate diabetic coordinator's help   -asked nurse to educate patient on giving shots/checking blood sugar  Right leg wound with ulceration  -WOC: Insert into the wound including the undermining area a piece of 1" Iodoform packing strip [place] # [phone]) cover with a 4 x 4 and secure with foam dressing. Change the Iodoform twice daily.  -CT scan: Skin thickening and subcutaneous edema diffusely along the right lower extremity as can be seen in cellulitis. Soft tissue defect medially at the level of the mid tibia with adjacent soft tissue gas and swelling but no well-defined fluid collection or findings to suggest underlying osteomyelitis on noncontrast CT. -change IV Abx to PO  Hypertension  -resume home meds  Hypokalemia -replete  History of multiple sclerosis  -resume home meds  Hyperlipidemia -statin   obesity Body mass  index is 48.89 kg/m.   Family Communication/Anticipated D/C date and plan/Code Status   DVT prophylaxis: Lovenox ordered. Code Status: Full Code.  Disposition Plan: Status is: Inpatient  Remains inpatient appropriate because:Inpatient level of care appropriate due to severity of illness  Dispo: The patient is from: Home              Anticipated d/c is to: Home              Patient currently is not medically stable to d/c. Home in 24- 48 hours   Difficult to place patient No         Medical Consultants:   None.   Subjective:   Has been c/o neuropathy in legs   Objective:    Vitals:   12/11/20 0617 12/11/20 1343 12/11/20 1910 12/12/20 0502  BP: (!) 179/98 (!) 136/104 (!) 154/81 (!) 159/98  Pulse: (!) 106 (!) 112 (!) 106 92  Resp: 18  18 17   Temp: 98.7 F (37.1 C) 99.3 F (37.4 C) 98.8 F (37.1 C) 98 F (36.7 C)  TempSrc:   Oral   SpO2: 96% 94% 94% 95%  Weight:      Height:        Intake/Output Summary (Last 24 hours) at 12/12/2020 1214 Last data filed at 12/12/2020 12/14/2020 Gross per 24 hour  Intake 1398.44 ml  Output 1250 ml  Net 148.44 ml   Filed Weights   12/10/20 0500  Weight: 125.2 kg    Exam:   General: Appearance:    Severely obese female in no acute distress     Lungs:  respirations unlabored  Heart:    Normal heart rate.    MS:   All extremities are intact. -- wound on right leg   Neurologic:   Awake, alert, oriented x 3       Data Reviewed:   I have personally reviewed following labs and imaging studies:  Labs: Labs show the following:   Basic Metabolic Panel: Recent Labs  Lab 12/10/20 0300 12/10/20 0357 12/10/20 0822 12/10/20 1755 12/11/20 0212 12/12/20 0208  NA 134* 139 148* 142 141 135  K 4.6 4.1 3.6 3.4* 3.7 2.9*  CL 97*  --  113* 109 106 102  CO2 27  --  27 27 24 26   GLUCOSE 875*  --  285* 188* 278* 227*  BUN 12  --  9 5* 5* 7  CREATININE 1.06*  --  0.72 0.60 0.73 0.62  CALCIUM 10.4*  --  9.6 9.5 9.0 9.2   MG  --   --   --   --   --  1.8   GFR Estimated Creatinine Clearance: 96.2 mL/min (by C-G formula based on SCr of 0.62 mg/dL). Liver Function Tests: Recent Labs  Lab 12/10/20 0300  AST 14*  ALT 25  ALKPHOS 84  BILITOT 1.0  PROT 7.2  ALBUMIN 3.8   No results for input(s): LIPASE, AMYLASE in the last 168 hours. Recent Labs  Lab 12/10/20 0300  AMMONIA 21   Coagulation profile No results for input(s): INR, PROTIME in the last 168 hours.  CBC: Recent Labs  Lab 12/10/20 0300 12/10/20 0357 12/10/20 0822 12/11/20 0212  WBC 7.7  --  10.6* 9.3  NEUTROABS 4.4  --   --   --   HGB 14.5 13.6 13.3 14.5  HCT 41.9 40.0 39.6 42.5  MCV 88.8  --  89.4 89.9  PLT 252  --  237 193   Cardiac Enzymes: No results for input(s): CKTOTAL, CKMB, CKMBINDEX, TROPONINI in the last 168 hours. BNP (last 3 results) No results for input(s): PROBNP in the last 8760 hours. CBG: Recent Labs  Lab 12/11/20 2339 12/12/20 0401 12/12/20 0726 12/12/20 1026 12/12/20 1117  GLUCAP 291* 238* 237* 301* 274*   D-Dimer: No results for input(s): DDIMER in the last 72 hours. Hgb A1c: Recent Labs    12/10/20 0822  HGBA1C 10.6*   Lipid Profile: No results for input(s): CHOL, HDL, LDLCALC, TRIG, CHOLHDL, LDLDIRECT in the last 72 hours. Thyroid function studies: Recent Labs    12/10/20 0300  TSH 1.973   Anemia work up: No results for input(s): VITAMINB12, FOLATE, FERRITIN, TIBC, IRON, RETICCTPCT in the last 72 hours. Sepsis Labs: Recent Labs  Lab 12/10/20 0300 12/10/20 0822 12/11/20 0212  WBC 7.7 10.6* 9.3  LATICACIDVEN 1.4  --   --     Microbiology Recent Results (from the past 240 hour(s))  Blood culture (routine x 2)     Status: None (Preliminary result)   Collection Time: 12/10/20  3:10 AM   Specimen: BLOOD  Result Value Ref Range Status   Specimen Description BLOOD LEFT ANTECUBITAL  Final   Special Requests   Final    BOTTLES DRAWN AEROBIC AND ANAEROBIC Blood Culture adequate  volume   Culture   Final    NO GROWTH 1 DAY Performed at [facility], [address] [place], [place]    Report Status PENDING  Incomplete  Blood culture (routine x 2)     Status: None (Preliminary result)   Collection Time: 12/10/20  3:10 AM   Specimen: BLOOD  Result Value Ref Range Status   Specimen Description BLOOD RIGHT ANTECUBITAL  Final   Special Requests   Final    BOTTLES DRAWN AEROBIC AND ANAEROBIC Blood Culture adequate volume   Culture   Final    NO GROWTH 1 DAY Performed at [facility], [address] [place], [place] [address]    Report Status PENDING  Incomplete  Resp Panel by RT-PCR (Flu A&B, Covid) Nasopharyngeal Swab     Status: None   Collection Time: 12/10/20  4:34 AM   Specimen: Nasopharyngeal Swab; Nasopharyngeal(NP) swabs in vial transport medium  Result Value Ref Range Status   SARS Coronavirus 2 by RT PCR NEGATIVE NEGATIVE Final    Comment: (NOTE) SARS-CoV-2 target nucleic acids are NOT DETECTED.  The SARS-CoV-2 RNA is generally detectable in upper respiratory specimens during the acute phase of infection. The lowest concentration of SARS-CoV-2 viral copies this assay can detect is 138 copies/mL. A negative result does not preclude SARS-Cov-2 infection and should not be used as the sole basis for treatment or other patient management decisions. A negative result may occur with  improper specimen collection/handling, submission of specimen other than nasopharyngeal swab, presence of viral mutation(s) within the areas targeted by this assay, and inadequate number of viral copies(<138 copies/mL). A negative result must be combined with clinical observations, patient history, and epidemiological information. The expected result is Negative.  Fact Sheet for Patients:  BloggerCourse.com  Fact Sheet for Healthcare Providers:  SeriousBroker.it  This test is no t yet approved or  cleared by the [place] FDA and  has been authorized for detection and/or diagnosis of SARS-CoV-2 by FDA under an Emergency Use Authorization (EUA). This EUA will remain  in effect (meaning this test can be used) for the duration of the COVID-19 declaration under Section 564(b)(1) of the Act, 21 U.S.C.section 360bbb-3(b)(1), unless the authorization is terminated  or revoked sooner.       Influenza A by PCR NEGATIVE NEGATIVE Final   Influenza B by PCR NEGATIVE NEGATIVE Final    Comment: (NOTE) The Xpert Xpress SARS-CoV-2/FLU/RSV plus assay is intended as an aid in the diagnosis of influenza from Nasopharyngeal swab specimens and should not be used as a sole basis for treatment. Nasal washings and aspirates are unacceptable for Xpert Xpress SARS-CoV-2/FLU/RSV testing.  Fact Sheet for Patients: BloggerCourse.com  Fact Sheet for Healthcare Providers: SeriousBroker.it  This test is not yet approved or cleared by the [place] FDA and has been authorized for detection and/or diagnosis of SARS-CoV-2 by FDA under an Emergency Use Authorization (EUA). This EUA will remain in effect (meaning this test can be used) for the duration of the COVID-19 declaration under Section 564(b)(1) of the Act, 21 U.S.C. section 360bbb-3(b)(1), unless the authorization is terminated or revoked.  Performed at [facility], [address] [place], [place] [address]     Procedures and diagnostic studies:  No results found.  Medications:    amitriptyline  25 mg Oral QHS   artificial tears   Both Eyes BID   busPIRone  15 mg Oral TID   dalfampridine  10 mg Oral Q12H   dicyclomine  20 mg Oral BID   Dimethyl Fumarate  1 capsule Oral BID   DULoxetine  60 mg Oral BID   Eluxadoline  1 tablet Oral BID   enoxaparin (LOVENOX) injection  40 mg Subcutaneous Q24H   insulin aspart  0-20 Units Subcutaneous Q4H  insulin glargine-yfgn  44 Units  Subcutaneous Daily   lamoTRIgine  200 mg Oral BID   losartan  100 mg Oral QPM   potassium chloride  40 mEq Oral Q4H   tiZANidine  2 mg Oral QID   Continuous Infusions:  ceFEPime (MAXIPIME) IV 2 g (12/12/20 0558)   vancomycin 1,000 mg (12/12/20 0330)     LOS: 1 day   Joseph ArtJessica U Loredana Medellin  [facility]   How to contact the [facility] Attending or Consulting provider 7A - 7P or covering provider during after hours 7P -7A, for this patient?  Check the care team in [facility] and look for a) attending/consulting TRH provider listed and b) the [facility] team listed Log into [email or web address] and use [place]'s universal password to access. If you do not have the password, please contact the hospital operator. Locate the [facility] provider you are looking for under [facility] and page to a number that you can be directly reached. If you still have difficulty reaching the provider, please page the [facility] (Director on Call) for the Hospitalists listed on amion for assistance.  12/12/2020, 12:14 PM

## 2020-12-12 NOTE — Progress Notes (Signed)
Physical Therapy Treatment Patient Details Name: [name] MRN: [id number] DOB: 11/21/1960 Today's Date: 12/12/2020    History of Present Illness 60 yo female who presented with AMS and elevated blood sugars. Given x2 doses Narcan CT. Also noted with R LE cellulitis PMH: DM MS HTn HLD chronic pain    PT Comments    Patient moving better today and requiring less assistance. Proper use of RW noted. Ambulated 100 ft with RW.  Patient does have a history of falls and can benefit from HHPT.     Follow Up Recommendations  Home health PT;Supervision for mobility/OOB (HHPT safety eval due to h/o falls)     Equipment Recommendations  Rolling walker with 5" wheels    Recommendations for Other Services       Precautions / Restrictions Precautions Precautions: Fall Precaution Comments: h/o falls per chart    Mobility  Bed Mobility Overal bed mobility: Needs Assistance Bed Mobility: Supine to Sit;Sit to Supine     Supine to sit: Modified independent (Device/Increase time);HOB elevated Sit to supine: Modified independent (Device/Increase time)        Transfers Overall transfer level: Modified independent Equipment used: Rolling walker (2 wheeled) Transfers: Sit to/from Stand Sit to Stand: Modified independent (Device/Increase time)         General transfer comment: no cues needed; steady  Ambulation/Gait Ambulation/Gait assistance: Min guard Gait Distance (Feet): 100 Feet Assistive device: Rolling walker (2 wheeled);1 person hand held assist Gait Pattern/deviations: Step-through pattern;Shuffle Gait velocity: controlled on RW   General Gait Details: no cues needed with RW; good proximity for body habitus   Stairs             Wheelchair Mobility    Modified Rankin (Stroke Patients Only)       Balance Overall balance assessment: Needs assistance Sitting-balance support: Feet supported Sitting balance-Leahy Scale: Good     Standing balance support:  No upper extremity supported;During functional activity Standing balance-Leahy Scale: Fair Standing balance comment: able to perform pericare in standing after toileting                            Cognition Arousal/Alertness: Awake/alert Behavior During Therapy: WFL for tasks assessed/performed Overall Cognitive Status: Within Functional Limits for tasks assessed                                        Exercises      General Comments        Pertinent Vitals/Pain Pain Assessment: 0-10 Pain Score: 10-Worst pain ever (per pt, however appearance/demeanor <10) Pain Location: all over Pain Descriptors / Indicators: Discomfort Pain Intervention(s): Limited activity within patient's tolerance    Home Living                      Prior Function            PT Goals (current goals can now be found in the care plan section) Acute Rehab PT Goals Patient Stated Goal: get home when able Progress towards PT goals: Progressing toward goals    Frequency    Min 3X/week      PT Plan Current plan remains appropriate    Co-evaluation              AM-PAC PT "6 Clicks" Mobility   Outcome Measure  Help needed  turning from your back to your side while in a flat bed without using bedrails?: None Help needed moving from lying on your back to sitting on the side of a flat bed without using bedrails?: A Little Help needed moving to and from a bed to a chair (including a wheelchair)?: None Help needed standing up from a chair using your arms (e.g., wheelchair or bedside chair)?: None Help needed to walk in hospital room?: A Little Help needed climbing 3-5 steps with a railing? : A Lot 6 Click Score: 20    End of Session   Activity Tolerance: Patient limited by fatigue;Patient limited by pain Patient left: with call bell/phone within reach;in bed;with bed alarm set Nurse Communication: Other (comment) (NT-canister needs to be emptied; RN-pt  asking about medicated lotion for legs) PT Visit Diagnosis: Muscle weakness (generalized) (M62.81);Pain Pain - Right/Left: Right Pain - part of body: Leg     Time: 5009-3818 PT Time Calculation (min) (ACUTE ONLY): 29 min  Charges:  $Gait Training: 23-37 mins                      Jerolyn Center, PT Pager [phone]    Zena Amos 12/12/2020, 10:19 AM

## 2020-12-13 DIAGNOSIS — G9340 Encephalopathy, unspecified: Secondary | ICD-10-CM | POA: Diagnosis not present

## 2020-12-13 LAB — GLUCOSE, CAPILLARY
Glucose-Capillary: 199 mg/dL — ABNORMAL HIGH (ref 70–99)
Glucose-Capillary: 322 mg/dL — ABNORMAL HIGH (ref 70–99)
Glucose-Capillary: 217 mg/dL — ABNORMAL HIGH (ref 70–99)
Glucose-Capillary: 165 mg/dL — ABNORMAL HIGH (ref 70–99)
Glucose-Capillary: 350 mg/dL — ABNORMAL HIGH (ref 70–99)

## 2020-12-13 LAB — BASIC METABOLIC PANEL
Anion gap: 7 (ref 5–15)
BUN: 7 mg/dL (ref 6–20)
CO2: 24 mmol/L (ref 22–32)
Calcium: 9.4 mg/dL (ref 8.9–10.3)
Chloride: 105 mmol/L (ref 98–111)
Creatinine, Ser: 0.84 mg/dL (ref 0.44–1.00)
GFR, Estimated: >60 mL/min (ref >60)
Glucose, Bld: 342 mg/dL — ABNORMAL HIGH (ref 70–99)
Potassium: 3.5 mmol/L (ref 3.5–5.1)
Sodium: 136 mmol/L (ref 135–145)

## 2020-12-13 MED ORDER — ENOXAPARIN SODIUM 60 MG/0.6ML IJ SOSY
60.0000 mg | PREFILLED_SYRINGE | INTRAMUSCULAR | Status: DC
  Administered 2020-12-14 – 2020-12-18 (×5): 60 mg via SUBCUTANEOUS
  Filled 2020-12-13 (×5): qty 0.6

## 2020-12-13 MED ORDER — INSULIN ASPART 100 UNIT/ML IJ SOLN
0.0000 [IU] | Freq: Every day | INTRAMUSCULAR | Status: DC
  Administered 2020-12-13: 4 [IU] via SUBCUTANEOUS
  Administered 2020-12-15: 3 [IU] via SUBCUTANEOUS
  Administered 2020-12-16 – 2020-12-17 (×2): 2 [IU] via SUBCUTANEOUS

## 2020-12-13 MED ORDER — CARVEDILOL 6.25 MG PO TABS
6.2500 mg | ORAL_TABLET | Freq: Two times a day (BID) | ORAL | Status: DC
  Administered 2020-12-13 – 2020-12-15 (×5): 6.25 mg via ORAL
  Filled 2020-12-13 (×5): qty 1

## 2020-12-13 MED ORDER — INSULIN ASPART 100 UNIT/ML IJ SOLN
0.0000 [IU] | Freq: Three times a day (TID) | INTRAMUSCULAR | Status: DC
  Administered 2020-12-13: 4 [IU] via SUBCUTANEOUS
  Administered 2020-12-14: 3 [IU] via SUBCUTANEOUS
  Administered 2020-12-14 – 2020-12-15 (×3): 11 [IU] via SUBCUTANEOUS
  Administered 2020-12-15: 7 [IU] via SUBCUTANEOUS
  Administered 2020-12-15: 4 [IU] via SUBCUTANEOUS
  Administered 2020-12-16: 7 [IU] via SUBCUTANEOUS
  Administered 2020-12-16: 11 [IU] via SUBCUTANEOUS
  Administered 2020-12-16 – 2020-12-17 (×2): 7 [IU] via SUBCUTANEOUS
  Administered 2020-12-17 – 2020-12-18 (×4): 4 [IU] via SUBCUTANEOUS
  Administered 2020-12-18: 3 [IU] via SUBCUTANEOUS

## 2020-12-13 MED ORDER — HYDROCERIN EX CREA
TOPICAL_CREAM | Freq: Two times a day (BID) | CUTANEOUS | Status: DC
  Administered 2020-12-15: 1 via TOPICAL
  Filled 2020-12-13: qty 113

## 2020-12-13 NOTE — TOC Progression Note (Signed)
Transition of Care [facility]) - Progression Note    Patient Details  Name: [name] MRN: [id number] Date of Birth: 12-Nov-1960  [facility] of [facility] CM/SW Contact  Erin Sons, [place] Phone Number: 12/13/2020, 2:58 PM  Clinical Narrative:     CSW was contacted by pt by text requesting a specific agency for [facility]. She doesn't have name of agency but provides number for [employer]. She explains her daughter works for this company. CSW callls and leaves voicemail. Voicemail identifies [employer] as working for [facility] which provides custodial RN/Tech care. CSW explained pt that [facility] does not provided PT services billed through medicare. Pt expressed understanding is okay with [facility].  Expected Discharge Plan: Home w Home Health Services Barriers to Discharge: Continued Medical Work up  Expected Discharge Plan and Services Expected Discharge Plan: Home w Home Health Services       Living arrangements for the past 2 months: Single Family Home                                       Social Determinants of Health (SDOH) Interventions    Readmission Risk Interventions No flowsheet data found.

## 2020-12-13 NOTE — Progress Notes (Signed)
Progress Note    [name]  KDX:[id number] DOB: 20-Oct-1960  DOA: 12/10/2020 PCP: Felix Pacini, FNP    Brief Narrative:     Medical records reviewed and are as summarized below:  [name] is an 60 y.o. female with history of multiple sclerosis, diabetes mellitus, hypertension, hyperlipidemia and chronic pain was brought to the ER after patient's family felt that patient was getting increasingly confused over the last 24 hours.  Found to have new onset DM.  Home in the AM.     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   -resolved  Hyperosmolar nonketotic hyperglycemia/new onset DM -patient's home medication list does not show any antidiabetic medication -SSI and lantus -appreciate diabetic coordinator's help  -- ? SSI vs schedule meal novolog -asked nurse to educate patient on giving shots/checking blood sugar  Right leg wound with ulceration  -WOC: Insert into the wound including the undermining area a piece of 1" Iodoform packing strip [employer] [place] # [phone]) cover with a 4 x 4 and secure with foam dressing. Change the Iodoform twice daily.  -CT scan: Skin thickening and subcutaneous edema diffusely along the right lower extremity as can be seen in cellulitis. Soft tissue defect medially at the level of the mid tibia with adjacent soft tissue gas and swelling but no well-defined fluid collection or findings to suggest underlying osteomyelitis on noncontrast CT. -change IV Abx to PO  Hypertension  -resume home meds  Hypokalemia -replete  History of multiple sclerosis  -resume home  meds  Hyperlipidemia -statin   obesity Body mass index is 48.89 kg/m.   Family Communication/Anticipated D/C date and plan/Code Status   DVT prophylaxis: Lovenox ordered. Code Status: Full Code.  Disposition Plan: Status is: Inpatient  Remains inpatient appropriate because:Inpatient level of care appropriate due to severity of illness  Dispo: The patient is from: Home              Anticipated d/c is to: Home              Patient currently is not medically stable to d/c. Home in AM when daughter able to be with her   Difficult to place patient No         Medical Consultants:   None.   Subjective:   Has given shots in the past to herself- c/o some neuropathy type pain   Objective:    Vitals:   12/12/20 2056 12/12/20 2124 12/13/20 0631 12/13/20 0858  BP: (!) 175/96 (!) 144/95 (!) 160/112 (!) 151/103  Pulse:  (!) 101 97 (!) 102  Resp:  20 19 18   Temp:  98.6 F (37 C) 98.2 F (36.8 C)   TempSrc:  Oral    SpO2:  100% 94% 95%  Weight:      Height:        Intake/Output Summary (Last 24 hours) at 12/13/2020 1305 Last data filed at 12/12/2020 1800 Gross per 24 hour  Intake 327.49 ml  Output 800 ml  Net -472.51 ml   Filed Weights   12/10/20 0500  Weight: 125.2 kg    Exam:   General:  Appearance:    Severely obese female in no acute distress     Lungs:     respirations unlabored  Heart:    Tachycardic.   MS:   All extremities are intact.  Wound covered w/o surrounding redness   Neurologic:   Awake, alert, oriented x 3. No apparent focal neurological           defect.      Data Reviewed:   I have personally reviewed following labs and imaging studies:  Labs: Labs show the following:   Basic Metabolic Panel: Recent Labs  Lab 12/10/20 0822 12/10/20 1755 12/11/20 0212 12/12/20 0208 12/13/20 0752  NA 148* 142 141 135 136  K 3.6 3.4* 3.7 2.9* 3.5  CL 113* 109 106 102 105  CO2 27 27 24 26 24   GLUCOSE 285* 188* 278* 227* 342*  BUN 9 5* 5* 7  7  CREATININE 0.72 0.60 0.73 0.62 0.84  CALCIUM 9.6 9.5 9.0 9.2 9.4  MG  --   --   --  1.8  --    GFR Estimated Creatinine Clearance: 91.6 mL/min (by C-G formula based on SCr of 0.84 mg/dL). Liver Function Tests: Recent Labs  Lab 12/10/20 0300  AST 14*  ALT 25  ALKPHOS 84  BILITOT 1.0  PROT 7.2  ALBUMIN 3.8   No results for input(s): LIPASE, AMYLASE in the last 168 hours. Recent Labs  Lab 12/10/20 0300  AMMONIA 21   Coagulation profile No results for input(s): INR, PROTIME in the last 168 hours.  CBC: Recent Labs  Lab 12/10/20 0300 12/10/20 0357 12/10/20 0822 12/11/20 0212  WBC 7.7  --  10.6* 9.3  NEUTROABS 4.4  --   --   --   HGB 14.5 13.6 13.3 14.5  HCT 41.9 40.0 39.6 42.5  MCV 88.8  --  89.4 89.9  PLT 252  --  237 193   Cardiac Enzymes: No results for input(s): CKTOTAL, CKMB, CKMBINDEX, TROPONINI in the last 168 hours. BNP (last 3 results) No results for input(s): PROBNP in the last 8760 hours. CBG: Recent Labs  Lab 12/12/20 2002 12/12/20 2345 12/13/20 0313 12/13/20 0737 12/13/20 1149  GLUCAP 273* 302* 199* 322* 217*   D-Dimer: No results for input(s): DDIMER in the last 72 hours. Hgb A1c: No results for input(s): HGBA1C in the last 72 hours.  Lipid Profile: No results for input(s): CHOL, HDL, LDLCALC, TRIG, CHOLHDL, LDLDIRECT in the last 72 hours. Thyroid function studies: No results for input(s): TSH, T4TOTAL, T3FREE, THYROIDAB in the last 72 hours.  Invalid input(s): FREET3  Anemia work up: No results for input(s): VITAMINB12, FOLATE, FERRITIN, TIBC, IRON, RETICCTPCT in the last 72 hours. Sepsis Labs: Recent Labs  Lab 12/10/20 0300 12/10/20 0822 12/11/20 0212  WBC 7.7 10.6* 9.3  LATICACIDVEN 1.4  --   --     Microbiology Recent Results (from the past 240 hour(s))  Blood culture (routine x 2)     Status: None (Preliminary result)   Collection Time: 12/10/20  3:10 AM   Specimen: BLOOD  Result Value Ref Range Status   Specimen  Description BLOOD LEFT ANTECUBITAL  Final   Special Requests   Final    BOTTLES DRAWN AEROBIC AND ANAEROBIC Blood Culture adequate volume   Culture   Final    NO GROWTH 2 DAYS Performed at [facility], [address] [place], [place]    Report Status PENDING  Incomplete  Blood culture (routine x 2)  Status: None (Preliminary result)   Collection Time: 12/10/20  3:10 AM   Specimen: BLOOD  Result Value Ref Range Status   Specimen Description BLOOD RIGHT ANTECUBITAL  Final   Special Requests   Final    BOTTLES DRAWN AEROBIC AND ANAEROBIC Blood Culture adequate volume   Culture   Final    NO GROWTH 2 DAYS Performed at [facility], [address] [place], [place] [address]    Report Status PENDING  Incomplete  Resp Panel by RT-PCR (Flu A&B, Covid) Nasopharyngeal Swab     Status: None   Collection Time: 12/10/20  4:34 AM   Specimen: Nasopharyngeal Swab; Nasopharyngeal(NP) swabs in vial transport medium  Result Value Ref Range Status   SARS Coronavirus 2 by RT PCR NEGATIVE NEGATIVE Final    Comment: (NOTE) SARS-CoV-2 target nucleic acids are NOT DETECTED.  The SARS-CoV-2 RNA is generally detectable in upper respiratory specimens during the acute phase of infection. The lowest concentration of SARS-CoV-2 viral copies this assay can detect is 138 copies/mL. A negative result does not preclude SARS-Cov-2 infection and should not be used as the sole basis for treatment or other patient management decisions. A negative result may occur with  improper specimen collection/handling, submission of specimen other than nasopharyngeal swab, presence of viral mutation(s) within the areas targeted by this assay, and inadequate number of viral copies(<138 copies/mL). A negative result must be combined with clinical observations, patient history, and epidemiological information. The expected result is Negative.  Fact Sheet for Patients:   BloggerCourse.comhttps://www.fda.gov/media/152166/download  Fact Sheet for Healthcare Providers:  SeriousBroker.ithttps://www.fda.gov/media/152162/download  This test is no t yet approved or cleared by the [place] FDA and  has been authorized for detection and/or diagnosis of SARS-CoV-2 by FDA under an Emergency Use Authorization (EUA). This EUA will remain  in effect (meaning this test can be used) for the duration of the COVID-19 declaration under Section 564(b)(1) of the Act, 21 U.S.C.section 360bbb-3(b)(1), unless the authorization is terminated  or revoked sooner.       Influenza A by PCR NEGATIVE NEGATIVE Final   Influenza B by PCR NEGATIVE NEGATIVE Final    Comment: (NOTE) The Xpert Xpress SARS-CoV-2/FLU/RSV plus assay is intended as an aid in the diagnosis of influenza from Nasopharyngeal swab specimens and should not be used as a sole basis for treatment. Nasal washings and aspirates are unacceptable for Xpert Xpress SARS-CoV-2/FLU/RSV testing.  Fact Sheet for Patients: BloggerCourse.comhttps://www.fda.gov/media/152166/download  Fact Sheet for Healthcare Providers: SeriousBroker.ithttps://www.fda.gov/media/152162/download  This test is not yet approved or cleared by the [place] FDA and has been authorized for detection and/or diagnosis of SARS-CoV-2 by FDA under an Emergency Use Authorization (EUA). This EUA will remain in effect (meaning this test can be used) for the duration of the COVID-19 declaration under Section 564(b)(1) of the Act, 21 U.S.C. section 360bbb-3(b)(1), unless the authorization is terminated or revoked.  Performed at [facility], [address] [place], [place] [address]     Procedures and diagnostic studies:  No results found.  Medications:    amitriptyline  25 mg Oral QHS   amoxicillin-clavulanate  1 tablet Oral Q12H   artificial tears   Both Eyes BID   busPIRone  15 mg Oral TID   carvedilol  6.25 mg Oral BID WC   dalfampridine  10 mg Oral Q12H   dicyclomine  20 mg Oral BID    Dimethyl Fumarate  1 capsule Oral BID   DULoxetine  60 mg Oral BID  Eluxadoline  1 tablet Oral BID   enoxaparin (LOVENOX) injection  40 mg Subcutaneous Q24H   hydrocerin   Topical BID   insulin aspart  0-20 Units Subcutaneous Q4H   insulin glargine-yfgn  48 Units Subcutaneous Daily   lamoTRIgine  200 mg Oral BID   losartan  100 mg Oral QPM   tiZANidine  2 mg Oral QID   Continuous Infusions:     LOS: 2 days   Joseph Art  [facility]   How to contact the [facility] Attending or Consulting provider 7A - 7P or covering provider during after hours 7P -7A, for this patient?  Check the care team in [facility] and look for a) attending/consulting TR[facility] provider listed and b) the [facility] team listed Log into [email or web address] and use [place]'s universal password to access. If you do not have the password, please contact the hospital operator. Locate the [facility] provider you are looking for under [facility] and page to a number that you can be directly reached. If you still have difficulty reaching the provider, please page the [facility] (Director on Call) for the Hospitalists listed on amion for assistance.  12/13/2020, 1:05 PM

## 2020-12-13 NOTE — Plan of Care (Signed)
  RD consulted for nutrition education regarding diabetes.   Lab Results  Component Value Date   HGBA1C 10.6 (H) 12/10/2020    RD provided "Carbohydrate Counting for People with Diabetes" handout from the Academy of Nutrition and Dietetics. Discussed different food groups and their effects on blood sugar, emphasizing carbohydrate-containing foods. Provided list of carbohydrates and recommended serving sizes of common foods.  Discussed importance of controlled and consistent carbohydrate intake throughout the day. Provided examples of ways to balance meals/snacks and encouraged intake of high-fiber, whole grain complex carbohydrates. Teach back method used.  Expect good compliance.  Body mass index is 48.89 kg/m. Pt meets criteria for morbid obesity based on current BMI.  Current diet order is carb modified, patient is consuming approximately 80% of meals at this time. Labs and medications reviewed. No further nutrition interventions warranted at this time. RD contact information provided. If additional nutrition issues arise, please re-consult RD.  Eugene Gavia, MS, RD, LDN (she/her/hers) RD pager number and weekend/on-call pager number located in Amion.

## 2020-12-13 NOTE — Progress Notes (Signed)
Spoke with patient about the insulin pen. Reviewed how to use, dosage, needle disposal, keeping insulin safe away from high heat, procedure for getting dosage. States that she had given insulin with pens to her mother in the past. She would prefer the insulin pens. She did well with return demonstration in using the pen. Discussed hypoglycemia and how to treat.   Patient states that she does not eat meals every day and sometimes may have just coffee all day long. Recommend that patient follow a sliding scale for blood sugars since her appetite is not always consistent. She will follow up with her PCP soon. She states that she will get her PCP to call in her a prescription for blood glucose meter kit today.   Recommend Lantus 40 units daily and Novolog RESISTANT (0-20 units) correction scale TID.    Lantus Solostar Insulin pen (order [id number]) Humalog Kwikpen insulin pen (order [phone])  "preferred" Insulin pen needles 31 gauge X 5 mm (order # [id number]) Home blood glucose meter kit (order # [id number])   Harvel Ricks RN BSN CDE Diabetes Coordinator Pager: [phone]  8am-5pm

## 2020-12-13 NOTE — Care Management Important Message (Signed)
Important Message  Patient Details  Name: [name] MRN: [id number] Date of Birth: [birth year]   Medicare Important Message Given:  Yes     Mardene Sayer 12/13/2020, 4:53 PM

## 2020-12-13 NOTE — Plan of Care (Signed)

## 2020-12-13 NOTE — Progress Notes (Signed)
Occupational Therapy Treatment Patient Details Name: [name] MRN: [id number] DOB: 1961/02/24 Today's Date: 12/13/2020    History of present illness 60 yo female who presented with AMS and elevated blood sugars. Given x2 doses Narcan CT. Also noted with R LE cellulitis PMH: DM MS HTn HLD chronic pain   OT comments  Patient met seated EOB upon entry in agreement with OT treatment session. Focus of session on self-care re-education, ADL transfers, short-distance functional mobility and education on AE to maximize safety/independence with self-care tasks in prep for safe d/c home. Patient requires grossly set-up assist for UB bathing/dressing at shower level, Min A for LB bathing and up to Mod A for LB dressing in sitting secondary to body habitus. Patient would benefit from continued acute OT services in prep for return to PLOF.    Follow Up Recommendations  Home health OT;Supervision - Intermittent;Other (comment) (Patient declining HHOT)    Equipment Recommendations  Other (comment) (Patient to obtain tub bench on her own.)    Recommendations for Other Services      Precautions / Restrictions Precautions Precautions: Fall Precaution Comments: h/o falls per chart Restrictions Weight Bearing Restrictions: No       Mobility Bed Mobility Overal bed mobility: Needs Assistance Bed Mobility: Supine to Sit;Sit to Supine     Supine to sit: Modified independent (Device/Increase time);HOB elevated Sit to supine: Modified independent (Device/Increase time)        Transfers Overall transfer level: Modified independent Equipment used: Rolling walker (2 wheeled) Transfers: Sit to/from Stand Sit to Stand: Modified independent (Device/Increase time)              Balance Overall balance assessment: Needs assistance Sitting-balance support: Feet supported Sitting balance-Leahy Scale: Good     Standing balance support: No upper extremity supported;During functional  activity Standing balance-Leahy Scale: Fair Standing balance comment: able to perform pericare in standing after toileting                           ADL either performed or assessed with clinical judgement   ADL Overall ADL's : Needs assistance/impaired         Upper Body Bathing: Supervision/ safety;Sitting Upper Body Bathing Details (indicate cue type and reason): At shower level Lower Body Bathing: Minimal assistance;Sit to/from stand Lower Body Bathing Details (indicate cue type and reason): At shower level. Reports using long-handled sponge at home. Upper Body Dressing : Set up;Sitting Upper Body Dressing Details (indicate cue type and reason): Donned anterior hospital gown Lower Body Dressing: Moderate assistance;Sit to/from stand Lower Body Dressing Details (indicate cue type and reason): Unable to reach feet 2/2 body habitus requiring assist to don footwear.         Tub/ Shower Transfer: Nurse, learning disability Details (indicate cue type and reason): HHA for walk-in shower transfer with use of grab bar.         Vision       Perception     Praxis      Cognition Arousal/Alertness: Awake/alert Behavior During Therapy: WFL for tasks assessed/performed Overall Cognitive Status: Within Functional Limits for tasks assessed                                          Exercises     Shoulder Instructions       General Comments  Pertinent Vitals/ Pain       Pain Assessment: 0-10 Pain Score: 9  Pain Location: all over (chronic pain at baseline) Pain Descriptors / Indicators: Discomfort Pain Intervention(s): Limited activity within patient's tolerance;Monitored during session;Repositioned  Home Living                                          Prior Functioning/Environment              Frequency  Min 2X/week        Progress Toward Goals  OT Goals(current goals can now be found in the care plan  section)  Progress towards OT goals: Progressing toward goals  Acute Rehab OT Goals Patient Stated Goal: To return home tomorrow OT Goal Formulation: With patient Time For Goal Achievement: 12/25/20 Potential to Achieve Goals: Good ADL Goals Pt Will Perform Grooming: with modified independence;standing Pt Will Perform Lower Body Bathing: with modified independence;sit to/from stand Pt Will Transfer to Toilet: with modified independence;ambulating Pt Will Perform Tub/Shower Transfer: Tub transfer;with supervision;ambulating Pt/caregiver will Perform Home Exercise Program: Increased strength;Both right and left upper extremity;With theraband;Independently;With written HEP provided  Plan Discharge plan remains appropriate;Frequency remains appropriate    Co-evaluation                 AM-PAC OT "6 Clicks" Daily Activity     Outcome Measure   Help from another person eating meals?: None Help from another person taking care of personal grooming?: A Little Help from another person toileting, which includes using toliet, bedpan, or urinal?: A Little Help from another person bathing (including washing, rinsing, drying)?: A Little Help from another person to put on and taking off regular upper body clothing?: A Little Help from another person to put on and taking off regular lower body clothing?: A Little 6 Click Score: 19    End of Session Equipment Utilized During Treatment: Rolling walker  OT Visit Diagnosis: Unsteadiness on feet (R26.81);Other abnormalities of gait and mobility (R26.89);Muscle weakness (generalized) (M62.81);Pain Pain - part of body: Leg   Activity Tolerance Patient tolerated treatment well   Patient Left in chair;with call bell/phone within reach;with chair alarm set   Nurse Communication Mobility status;Other (comment) (Response to treatment)        Time: 8016-5537 OT Time Calculation (min): 31 min  Charges: OT General Charges $OT Visit: 1  Visit OT Treatments $Self Care/Home Management : 23-37 mins  Denisha Hoel H. OTR/L Supplemental OT, Department of rehab services [phone]   Dina Mobley R H. 12/13/2020, 12:56 PM

## 2020-12-13 NOTE — Discharge Instructions (Addendum)
Outpatient Diabetes Education  You have been referred to [facility] for outpatient diabetes education. A referral has been sent and the office will contact you for an appointment. For additional questions or to schedule an appointment, call [phone].    Carbohydrate Counting For People With Diabetes  Foods with carbohydrates make your blood glucose level go up. Learning how to count carbohydrates can help you control your blood glucose levels. First, identify the foods you eat that contain carbohydrates. Then, using the Foods with Carbohydrates chart, determine about how much carbohydrates are in your meals and snacks. Make sure you are eating foods with fiber, protein, and healthy fat along with your carbohydrate foods. Foods with Carbohydrates The following table shows carbohydrate foods that have about 15 grams of carbohydrate each. Using measuring cups, spoons, or a food scale when you first begin learning about carbohydrate counting can help you learn about the portion sizes you typically eat. The following foods have 15 grams carbohydrate each:  Grains . 1 slice bread (1 ounce)  . 1 small tortilla (6-inch size)  .  large bagel (1 ounce)  . 1/3 cup pasta or rice (cooked)  .  hamburger or hot dog bun ( ounce)  .  cup cooked cereal  .  to  cup ready-to-eat cereal  . 2 taco shells (5-inch size) Fruit . 1 small fresh fruit ( to 1 cup)  .  medium banana  . 17 small grapes (3 ounces)  . 1 cup melon or berries  .  cup canned or frozen fruit  . 2 tablespoons dried fruit (blueberries, cherries, cranberries, raisins)  .  cup unsweetened fruit juice  Starchy Vegetables .  cup cooked beans, peas, corn, potatoes/sweet potatoes  .  large baked potato (3 ounces)  . 1 cup acorn or butternut squash  Snack Foods . 3 to 6 crackers  . 8 potato chips or 13 tortilla chips ( ounce to 1 ounce)  . 3 cups popped popcorn  Dairy . 3/4 cup (6  ounces) nonfat plain yogurt, or yogurt with sugar-free sweetener  . 1 cup milk  . 1 cup plain rice, soy, coconut or flavored almond milk Sweets and Desserts .  cup ice cream or frozen yogurt  . 1 tablespoon jam, jelly, pancake syrup, table sugar, or honey  . 2 tablespoons light pancake syrup  . 1 inch square of frosted cake or 2 inch square of unfrosted cake  . 2 small cookies (2/3 ounce each) or  large cookie  Sometimes you'll have to estimate carbohydrate amounts if you don't know the exact recipe. One cup of mixed foods like soups can have 1 to 2 carbohydrate servings, while some casseroles might have 2 or more servings of carbohydrate. Foods that have less than 20 calories in each serving can be counted as "free" foods. Count 1 cup raw vegetables, or  cup cooked non-starchy vegetables as "free" foods. If you eat 3 or more servings at one meal, then count them as 1 carbohydrate serving.  Foods without Carbohydrates  Not all foods contain carbohydrates. Meat, some dairy, fats, non-starchy vegetables, and many beverages don't contain carbohydrate. So when you count carbohydrates, you can generally exclude chicken, pork, beef, fish, seafood, eggs, tofu, cheese, butter, sour cream, avocado, nuts, seeds, olives, mayonnaise, water, black coffee, unsweetened tea, and zero-calorie drinks. Vegetables with no or low carbohydrate include green beans, cauliflower, tomatoes, and onions. How much carbohydrate should I eat at each meal?  Carbohydrate   counting can help you plan your meals and manage your weight. Following are some starting points for carbohydrate intake at each meal. Work with your registered dietitian nutritionist to find the best range that works for your blood glucose and weight.   To Lose Weight To Maintain Weight  Women 2 - 3 carb servings 3 - 4 carb servings  Men 3 - 4 carb servings 4 - 5 carb servings  Checking your blood glucose after meals will help you know if you need to adjust the  timing, type, or number of carbohydrate servings in your meal plan. Achieve and keep a healthy body weight by balancing your food intake and physical activity.  Tips How should I plan my meals?  Plan for half the food on your plate to include non-starchy vegetables, like salad greens, broccoli, or carrots. Try to eat 3 to 5 servings of non-starchy vegetables every day. Have a protein food at each meal. Protein foods include chicken, fish, meat, eggs, or beans (note that beans contain carbohydrate). These two food groups (non-starchy vegetables and proteins) are low in carbohydrate. If you fill up your plate with these foods, you will eat less carbohydrate but still fill up your stomach. Try to limit your carbohydrate portion to  of the plate.  What fats are healthiest to eat?  Diabetes increases risk for heart disease. To help protect your heart, eat more healthy fats, such as olive oil, nuts, and avocado. Eat less saturated fats like butter, cream, and high-fat meats, like bacon and sausage. Avoid trans fats, which are in all foods that list "partially hydrogenated oil" as an ingredient. What should I drink?  Choose drinks that are not sweetened with sugar. The healthiest choices are water, carbonated or seltzer waters, and tea and coffee without added sugars.  Sweet drinks will make your blood glucose go up very quickly. One serving of soda or energy drink is  cup. It is best to drink these beverages only if your blood glucose is low.  Artificially sweetened, or diet drinks, typically do not increase your blood glucose if they have zero calories in them. Read labels of beverages, as some diet drinks do have carbohydrate and will raise your blood glucose. Label Reading Tips Read Nutrition Facts labels to find out how many grams of carbohydrate are in a food you want to eat. Don't forget: sometimes serving sizes on the label aren't the same as how much food you are going to eat, so you may need to  calculate how much carbohydrate is in the food you are serving yourself.   Carbohydrate Counting for People with Diabetes Sample 1-Day Menu  Breakfast  cup yogurt, low fat, low sugar (1 carbohydrate serving)   cup cereal, ready-to-eat, unsweetened (1 carbohydrate serving)  1 cup strawberries (1 carbohydrate serving)   cup almonds ( carbohydrate serving)  Lunch 1, 5 ounce can chunk light tuna  2 ounces cheese, low fat cheddar  6 whole wheat crackers (1 carbohydrate serving)  1 small apple (1 carbohydrate servings)   cup carrots ( carbohydrate serving)   cup snap peas  1 cup 1% milk (1 carbohydrate serving)   Evening Meal Stir fry made with: 3 ounces chicken  1 cup brown rice (3 carbohydrate servings)   cup broccoli ( carbohydrate serving)   cup green beans   cup onions  1 tablespoon olive oil  2 tablespoons teriyaki sauce ( carbohydrate serving)  Evening Snack 1 extra small banana (1 carbohydrate serving)  1   tablespoon peanut butter   Carbohydrate Counting for People with Diabetes Vegan Sample 1-Day Menu  Breakfast 1 cup cooked oatmeal (2 carbohydrate servings)   cup blueberries (1 carbohydrate serving)  2 tablespoons flaxseeds  1 cup soymilk fortified with calcium and vitamin D  1 cup coffee  Lunch 2 slices whole wheat bread (2 carbohydrate servings)   cup baked tofu   cup lettuce  2 slices tomato  2 slices avocado   cup baby carrots ( carbohydrate serving)  1 orange (1 carbohydrate serving)  1 cup soymilk fortified with calcium and vitamin D   Evening Meal Burrito made with: 1 6-inch corn tortilla (1 carbohydrate serving)  1 cup refried vegetarian beans (2 carbohydrate servings)   cup chopped tomatoes   cup lettuce   cup salsa  1/3 cup brown rice (1 carbohydrate serving)  1 tablespoon olive oil for rice   cup zucchini   Evening Snack 6 small whole grain crackers (1 carbohydrate serving)  2 apricots ( carbohydrate serving)   cup unsalted peanuts  ( carbohydrate serving)    Carbohydrate Counting for People with Diabetes Vegetarian (Lacto-Ovo) Sample 1-Day Menu  Breakfast 1 cup cooked oatmeal (2 carbohydrate servings)   cup blueberries (1 carbohydrate serving)  2 tablespoons flaxseeds  1 egg  1 cup 1% milk (1 carbohydrate serving)  1 cup coffee  Lunch 2 slices whole wheat bread (2 carbohydrate servings)  2 ounces low-fat cheese   cup lettuce  2 slices tomato  2 slices avocado   cup baby carrots ( carbohydrate serving)  1 orange (1 carbohydrate serving)  1 cup unsweetened tea  Evening Meal Burrito made with: 1 6-inch corn tortilla (1 carbohydrate serving)   cup refried vegetarian beans (1 carbohydrate serving)   cup tomatoes   cup lettuce   cup salsa  1/3 cup brown rice (1 carbohydrate serving)  1 tablespoon olive oil for rice   cup zucchini  1 cup 1% milk (1 carbohydrate serving)  Evening Snack 6 small whole grain crackers (1 carbohydrate serving)  2 apricots ( carbohydrate serving)   cup unsalted peanuts ( carbohydrate serving)    Copyright 2020  [employer]. All rights reserved.  Using Nutrition Labels: Carbohydrate  . Serving Size  . Look at the serving size. All the information on the label is based on this portion. . Servings Per Container  . The number of servings contained in the package. . Guidelines for Carbohydrate  . Look at the total grams of carbohydrate in the serving size.  . 1 carbohydrate choice = 15 grams of carbohydrate. Range of Carbohydrate Grams Per Choice  Carbohydrate Grams/Choice Carbohydrate Choices  6-10   11-20 1  21-25 1  26-35 2  36-40 2  41-50 3  51-55 3  56-65 4  66-70 4  71-80 5    Copyright 2020  [employer]y [employer]. All rights reserved.   

## 2020-12-14 ENCOUNTER — Encounter (HOSPITAL_COMMUNITY): Payer: Self-pay | Admitting: Internal Medicine

## 2020-12-14 ENCOUNTER — Encounter (HOSPITAL_COMMUNITY)
Admission: EM | Disposition: A | Discharge status: Home Health | Payer: Self-pay | Source: Home / Self Care | Attending: Internal Medicine

## 2020-12-14 ENCOUNTER — Inpatient Hospital Stay (HOSPITAL_COMMUNITY): Payer: Medicare Other | Admitting: Anesthesiology

## 2020-12-14 ENCOUNTER — Inpatient Hospital Stay (HOSPITAL_COMMUNITY): Payer: Medicare Other

## 2020-12-14 DIAGNOSIS — G9340 Encephalopathy, unspecified: Secondary | ICD-10-CM | POA: Diagnosis not present

## 2020-12-14 HISTORY — PX: I & D EXTREMITY: SHX5045

## 2020-12-14 LAB — GLUCOSE, CAPILLARY
Glucose-Capillary: 261 mg/dL — ABNORMAL HIGH (ref 70–99)
Glucose-Capillary: 308 mg/dL — ABNORMAL HIGH (ref 70–99)
Glucose-Capillary: 269 mg/dL — ABNORMAL HIGH (ref 70–99)
Glucose-Capillary: 127 mg/dL — ABNORMAL HIGH (ref 70–99)
Glucose-Capillary: 95 mg/dL (ref 70–99)
Glucose-Capillary: 79 mg/dL (ref 70–99)
Glucose-Capillary: 88 mg/dL (ref 70–99)

## 2020-12-14 SURGERY — IRRIGATION AND DEBRIDEMENT EXTREMITY
Anesthesia: General | Site: Leg Lower | Laterality: Right

## 2020-12-14 MED ORDER — SODIUM CHLORIDE 0.9 % IR SOLN
Status: DC | PRN
Start: 2020-12-14 — End: 2020-12-14
  Administered 2020-12-14: 3000 mL

## 2020-12-14 MED ORDER — EPHEDRINE SULFATE-NACL 50-0.9 MG/10ML-% IV SOSY
PREFILLED_SYRINGE | INTRAVENOUS | Status: DC | PRN
  Administered 2020-12-14 (×2): 5 mg via INTRAVENOUS

## 2020-12-14 MED ORDER — ONDANSETRON HCL 4 MG PO TABS: 4.0000 mg | ORAL_TABLET | Freq: Four times a day (QID) | ORAL | Status: DC | PRN

## 2020-12-14 MED ORDER — METOCLOPRAMIDE HCL 5 MG/ML IJ SOLN: 5.0000 mg | Freq: Three times a day (TID) | INTRAMUSCULAR | Status: DC | PRN

## 2020-12-14 MED ORDER — FENTANYL CITRATE (PF) 250 MCG/5ML IJ SOLN
INTRAMUSCULAR | Status: DC | PRN
  Administered 2020-12-14: 25 ug via INTRAVENOUS

## 2020-12-14 MED ORDER — MIDAZOLAM HCL 2 MG/2ML IJ SOLN
INTRAMUSCULAR | Status: DC | PRN
  Administered 2020-12-14: 2 mg via INTRAVENOUS

## 2020-12-14 MED ORDER — DOCUSATE SODIUM 100 MG PO CAPS
100.0000 mg | ORAL_CAPSULE | Freq: Two times a day (BID) | ORAL | Status: DC
  Administered 2020-12-14 – 2020-12-18 (×7): 100 mg via ORAL
  Filled 2020-12-14 (×8): qty 1

## 2020-12-14 MED ORDER — ORAL CARE MOUTH RINSE: 15.0000 mL | Freq: Once | OROMUCOSAL | Status: DC

## 2020-12-14 MED ORDER — ACETAMINOPHEN 500 MG PO TABS: 1000.0000 mg | ORAL_TABLET | Freq: Once | ORAL | Status: DC | PRN

## 2020-12-14 MED ORDER — BUPIVACAINE HCL (PF) 0.5 % IJ SOLN
INTRAMUSCULAR | Status: AC
  Filled 2020-12-14: qty 30

## 2020-12-14 MED ORDER — 0.9 % SODIUM CHLORIDE (POUR BTL) OPTIME
TOPICAL | Status: DC | PRN
  Administered 2020-12-14: 1000 mL

## 2020-12-14 MED ORDER — METRONIDAZOLE 500 MG/100ML IV SOLN
500.0000 mg | Freq: Three times a day (TID) | INTRAVENOUS | Status: DC
  Administered 2020-12-14 – 2020-12-18 (×12): 500 mg via INTRAVENOUS
  Filled 2020-12-14 (×12): qty 100

## 2020-12-14 MED ORDER — OXYCODONE HCL 5 MG PO TABS
5.0000 mg | ORAL_TABLET | Freq: Once | ORAL | Status: DC | PRN
Start: 2020-12-14 — End: 2020-12-14

## 2020-12-14 MED ORDER — PROPOFOL 10 MG/ML IV BOLUS
INTRAVENOUS | Status: DC | PRN
  Administered 2020-12-14: 200 mg via INTRAVENOUS

## 2020-12-14 MED ORDER — FENTANYL CITRATE (PF) 100 MCG/2ML IJ SOLN
25.0000 ug | INTRAMUSCULAR | Status: DC | PRN
  Administered 2020-12-14 (×2): 25 ug via INTRAVENOUS
  Administered 2020-12-14: 50 ug via INTRAVENOUS

## 2020-12-14 MED ORDER — ACETAMINOPHEN 10 MG/ML IV SOLN
1000.0000 mg | Freq: Once | INTRAVENOUS | Status: DC | PRN
  Administered 2020-12-14: 1000 mg via INTRAVENOUS

## 2020-12-14 MED ORDER — ONDANSETRON HCL 4 MG/2ML IJ SOLN: 4.0000 mg | Freq: Four times a day (QID) | INTRAMUSCULAR | Status: DC | PRN

## 2020-12-14 MED ORDER — CHLORHEXIDINE GLUCONATE 0.12 % MT SOLN
OROMUCOSAL | Status: AC
  Filled 2020-12-14: qty 15

## 2020-12-14 MED ORDER — METOCLOPRAMIDE HCL 5 MG PO TABS: 5.0000 mg | ORAL_TABLET | Freq: Three times a day (TID) | ORAL | Status: DC | PRN

## 2020-12-14 MED ORDER — ONDANSETRON HCL 4 MG/2ML IJ SOLN
INTRAMUSCULAR | Status: DC | PRN
  Administered 2020-12-14: 4 mg via INTRAVENOUS

## 2020-12-14 MED ORDER — CHLORHEXIDINE GLUCONATE 0.12 % MT SOLN: 15.0000 mL | Freq: Once | OROMUCOSAL | Status: DC

## 2020-12-14 MED ORDER — FENTANYL CITRATE (PF) 250 MCG/5ML IJ SOLN
INTRAMUSCULAR | Status: AC
  Filled 2020-12-14: qty 5

## 2020-12-14 MED ORDER — MIDAZOLAM HCL 2 MG/2ML IJ SOLN
INTRAMUSCULAR | Status: AC
  Filled 2020-12-14: qty 2

## 2020-12-14 MED ORDER — LACTATED RINGERS IV SOLN: INTRAVENOUS | Status: DC

## 2020-12-14 MED ORDER — OXYCODONE HCL 5 MG/5ML PO SOLN: 5.0000 mg | Freq: Once | ORAL | Status: DC | PRN

## 2020-12-14 MED ORDER — SODIUM CHLORIDE 0.9 % IV SOLN
2.0000 g | INTRAVENOUS | Status: DC
  Administered 2020-12-14 – 2020-12-18 (×5): 2 g via INTRAVENOUS
  Filled 2020-12-14 (×2): qty 20
  Filled 2020-12-14 (×2): qty 2
  Filled 2020-12-14 (×2): qty 20

## 2020-12-14 MED ORDER — ACETAMINOPHEN 10 MG/ML IV SOLN
INTRAVENOUS | Status: AC
  Filled 2020-12-14: qty 100

## 2020-12-14 MED ORDER — FENTANYL CITRATE (PF) 100 MCG/2ML IJ SOLN
INTRAMUSCULAR | Status: AC
  Filled 2020-12-14: qty 2

## 2020-12-14 MED ORDER — ACETAMINOPHEN 160 MG/5ML PO SOLN: 1000.0000 mg | Freq: Once | ORAL | Status: DC | PRN

## 2020-12-14 MED ORDER — PHENYLEPHRINE 40 MCG/ML (10ML) SYRINGE FOR IV PUSH (FOR BLOOD PRESSURE SUPPORT)
PREFILLED_SYRINGE | INTRAVENOUS | Status: DC | PRN
  Administered 2020-12-14 (×2): 80 ug via INTRAVENOUS
  Administered 2020-12-14: 40 ug via INTRAVENOUS

## 2020-12-14 SURGICAL SUPPLY — 44 items
BAG COUNTER SPONGE SURGICOUNT (BAG) ×2 IMPLANT
BAG SPNG CNTER NS LX DISP (BAG) ×1
BANDAGE ESMARK 6X9 LF (GAUZE/BANDAGES/DRESSINGS) IMPLANT
BNDG CMPR 9X6 STRL LF SNTH (GAUZE/BANDAGES/DRESSINGS)
BNDG COHESIVE 3X5 TAN STRL LF (GAUZE/BANDAGES/DRESSINGS) ×1 IMPLANT
BNDG CONFORM 3 STRL LF (GAUZE/BANDAGES/DRESSINGS) ×1 IMPLANT
BNDG ELASTIC 6X5.8 VLCR STR LF (GAUZE/BANDAGES/DRESSINGS) ×1 IMPLANT
BNDG ESMARK 6X9 LF (GAUZE/BANDAGES/DRESSINGS)
BNDG GAUZE ELAST 4 BULKY (GAUZE/BANDAGES/DRESSINGS) IMPLANT
CNTNR SPEC C3OZ STD GRAD LEK (MISCELLANEOUS) IMPLANT
CONT SPEC 3OZ W/LID STRL (MISCELLANEOUS) ×2
COVER SURGICAL LIGHT HANDLE (MISCELLANEOUS) ×2 IMPLANT
CUFF TOURN SGL QUICK 18X4 (TOURNIQUET CUFF) IMPLANT
CUFF TOURN SGL QUICK 34 (TOURNIQUET CUFF)
CUFF TRNQT CYL 34X4.125X (TOURNIQUET CUFF) IMPLANT
DRSG PAD ABDOMINAL 8X10 ST (GAUZE/BANDAGES/DRESSINGS) ×2 IMPLANT
DURAPREP 26ML APPLICATOR (WOUND CARE) ×1 IMPLANT
GAUZE PACKING IODOFORM 1/4X15 (PACKING) ×1 IMPLANT
GAUZE SPONGE 4X4 12PLY STRL (GAUZE/BANDAGES/DRESSINGS) ×1 IMPLANT
GAUZE SPONGE 4X4 12PLY STRL LF (GAUZE/BANDAGES/DRESSINGS) ×1 IMPLANT
GLOVE SURG ENC TEXT LTX SZ7 (GLOVE) ×1 IMPLANT
GLOVE SURG ENC TEXT LTX SZ7.5 (GLOVE) IMPLANT
GLOVE SURG LTX SZ8 (GLOVE) ×2 IMPLANT
GLOVE SURG ORTHO LTX SZ7.5 (GLOVE) ×4 IMPLANT
GLOVE SURG ORTHO LTX SZ8.5 (GLOVE) ×5 IMPLANT
GLOVE SURG UNDER POLY LF SZ7.5 (GLOVE) ×2 IMPLANT
GLOVE SURG UNDER POLY LF SZ8.5 (GLOVE) ×2 IMPLANT
GOWN STRL REUS W/ TWL LRG LVL3 (GOWN DISPOSABLE) ×1 IMPLANT
GOWN STRL REUS W/ TWL XL LVL3 (GOWN DISPOSABLE) ×3 IMPLANT
GOWN STRL REUS W/TWL LRG LVL3 (GOWN DISPOSABLE) ×2
GOWN STRL REUS W/TWL XL LVL3 (GOWN DISPOSABLE) ×6
HANDPIECE INTERPULSE COAX TIP (DISPOSABLE)
KIT BASIN OR (CUSTOM PROCEDURE TRAY) ×2 IMPLANT
MANIFOLD NEPTUNE II (INSTRUMENTS) ×2 IMPLANT
PACK ORTHO EXTREMITY (CUSTOM PROCEDURE TRAY) ×2 IMPLANT
PAD CAST 4YDX4 CTTN HI CHSV (CAST SUPPLIES) ×1 IMPLANT
PADDING CAST COTTON 4X4 STRL (CAST SUPPLIES)
SET CYSTO W/LG BORE CLAMP LF (SET/KITS/TRAYS/PACK) ×1 IMPLANT
SET HNDPC FAN SPRY TIP SCT (DISPOSABLE) ×1 IMPLANT
SHIELD FACE FULL FLUID (MISCELLANEOUS) ×2 IMPLANT
SWAB COLLECTION DEVICE MRSA (MISCELLANEOUS) ×2 IMPLANT
SWAB CULTURE ESWAB REG 1ML (MISCELLANEOUS) ×1 IMPLANT
SYR CONTROL 10ML LL (SYRINGE) ×1 IMPLANT
TOWEL GREEN STERILE (TOWEL DISPOSABLE) ×4 IMPLANT

## 2020-12-14 NOTE — Anesthesia Procedure Notes (Signed)
Procedure Name: LMA Insertion Date/Time: 12/14/2020 5:36 PM Performed by: Carlos American, CRNA Pre-anesthesia Checklist: Patient identified, Emergency Drugs available, Suction available and Patient being monitored Patient Re-evaluated:Patient Re-evaluated prior to induction Oxygen Delivery Method: Circle System Utilized Preoxygenation: Pre-oxygenation with 100% oxygen Induction Type: IV induction Ventilation: Mask ventilation without difficulty LMA: LMA inserted LMA Size: 4.0 Number of attempts: 1 Placement Confirmation: positive ETCO2 Tube secured with: Tape Dental Injury: Teeth and Oropharynx as per pre-operative assessment

## 2020-12-14 NOTE — Anesthesia Preprocedure Evaluation (Signed)
Anesthesia Evaluation  Patient identified by MRN, date of birth, ID band Patient awake    Reviewed: Allergy & Precautions, NPO status , Patient's Chart, lab work & pertinent test results  History of Anesthesia Complications Negative for: history of anesthetic complications  Airway Mallampati: III  TM Distance: >3 FB Neck ROM: Full    Dental  (+) Dental Advisory Given, Teeth Intact, Chipped,    Pulmonary shortness of breath, neg sleep apnea, neg COPD, neg recent URI, Current Smoker and Patient abstained from smoking.,  Covid-19 Nucleic Acid Test Results Lab Results      Component                Value               Date                      SARSCOV2NAA              NEGATIVE            12/10/2020              breath sounds clear to auscultation       Cardiovascular hypertension, Pt. on medications (-) angina(-) Past MI and (-) CHF  Rhythm:Regular     Neuro/Psych PSYCHIATRIC DISORDERS Anxiety Depression  Neuromuscular disease    GI/Hepatic Neg liver ROS, GERD  Medicated and Controlled,  Endo/Other  diabetes, Poorly Controlled, Type obesityLab Results      Component                Value               Date                      HGBA1C                   10.6 (H)            12/10/2020             Renal/GU negative Renal ROSLab Results      Component                Value               Date                      CREATININE               0.84                12/13/2020                Musculoskeletal  (+) Arthritis , abscess right lower leg   Abdominal   Peds  Hematology negative hematology ROS (+) Lab Results      Component                Value               Date                      WBC                      9.3                 12/11/2020  HGB                      14.5                12/11/2020                HCT                      42.5                12/11/2020                MCV                       89.9                12/11/2020                PLT                      193                 12/11/2020              Anesthesia Other Findings   Reproductive/Obstetrics                             Anesthesia Physical Anesthesia Plan  ASA: 4  Anesthesia Plan: General   Post-op Pain Management:    Induction: Intravenous  PONV Risk Score and Plan: 2 and Ondansetron and Midazolam  Airway Management Planned: Oral ETT and LMA  Additional Equipment: None  Intra-op Plan:   Post-operative Plan: Extubation in OR  Informed Consent: I have reviewed the patients History and Physical, chart, labs and discussed the procedure including the risks, benefits and alternatives for the proposed anesthesia with the patient or authorized representative who has indicated his/her understanding and acceptance.     Dental advisory given  Plan Discussed with: CRNA and Anesthesiologist  Anesthesia Plan Comments:         Anesthesia Quick Evaluation

## 2020-12-14 NOTE — Op Note (Signed)
OPERATIVE REPORT   12/14/2020  6:09 PM  PATIENT:  [name]   SURGEON:  Jonette Pesa, MD  ASSISTANT: Barrie Dunker, PA-C.   PREOPERATIVE DIAGNOSIS: Right lower extremity abscess.  POSTOPERATIVE DIAGNOSIS:  Same.  PROCEDURE: Excisional debridement skin, subcutaneous tissue right lower extremity  ANESTHESIA:   GETA.  ANTIBIOTICS: Patient is already receiving scheduled IV antibiotics.  IMPLANTS: None.  SPECIMENS: Right lower extremity abscess culture swab.  COMPLICATIONS: None.  DISPOSITION: Stable to PACU.  SURGICAL INDICATIONS:  [name] is a 60 y.o. female with a diagnosis of abscess right lower leg erythema and abscess.  She was admitted to the hospital for IV antibiotics.  Orthopedic consultation was placed today for management of her lower extremity abscess.  The risks, benefits, and alternatives were discussed with the patient preoperatively including but not limited to the risks of infection, bleeding, nerve / blood vessel injury, cardiopulmonary complications, the need for repeat surgery, among others, and the patient was willing to proceed.  PROCEDURE IN DETAIL: The patient was identified in the holding area using 2 identifiers.  The surgical site was marked by myself.  She was taken to the operating room, placed supine on the operative table.  General anesthesia was induced.  The right lower extremity was prepped and draped in the normal sterile surgical fashion.  Timeout was called, verifying site and site of surgery.  I began by examining the right lower extremity.  Just medial to the anterior crest of the tibia, she had an 18 x 8 x 2 mm wound.  There is a fluctuant abscess about 3.5 cm medial to this wound.  Using a hemostat, I was able to enter the wound and reach the abscess subcutaneously.  There was purulent fluid that was expressed.  I swabbed this fluid for aerobic and anaerobic culture.  Using a hemostat I ensured that there were no further  loculations.  I extended the wound proximally and distally.  I performed an excisional debridement of a small amount of nonviable subcutaneous tissue using a rongeur.  In the bed of the wound is the periosteum over the tibia.  Once the abscess was fully decompressed, I then irrigated the wound with 3 L of normal saline using GU tubing.  The skin overlying the area of fluctuance is of questionable viability.  The wound was packed with quarter inch Nu Gauze and dressed with a sterile dressing.  Patient was extubated taken the PACU in stable condition.  Sponge, needle, and instrument counts were correct at the end of the case x2.  There were no known complications.    Debridement type: Excisional Debridement  Side: right  Body Location: lower leg   Tools used for debridement: scalpel and rongeur  Pre-debridement Wound size (cm):   Length: 0.18        Width: 0.08     Depth: 0.02   Post-debridement Wound size (cm):   Length: 0.25        Width: 0.05     Depth: 0.05   Debridement depth beyond dead/damaged tissue down to healthy viable tissue: yes  Tissue layer involved: skin, subcutaneous tissue, muscle / fascia  Nature of tissue removed: Devitalized Tissue and Purulence  Irrigation volume: 3L     Irrigation fluid type: Normal Saline    POSTOPERATIVE PLAN: Postoperatively, the patient be readmitted to the hospitalist.  Follow intraoperative cultures, tailor antibiotics as necessary.  She will need daily packing of the wound.  Of note, the wound does  tunnel medially about 4 cm.  Recommend follow-up with wound care.  If she has breakdown of the medial skin, she may need a coverage procedure with plastics.

## 2020-12-14 NOTE — Consult Note (Signed)
ORTHOPAEDIC CONSULTATION  REQUESTING PHYSICIAN: Geradine Girt, DO  PCP:  Beverley Fiedler, FNP  Chief Complaint: Right lower extremity wound  HPI: [name] is a 60 y.o. female who complains of a wound on her right lower leg.  The patient was being seen at the emergency department and was admitted for altered mental status.  She has past medical history significant for multiple sclerosis, diabetes mellitus, hypertension, hyperlipidemia, and chronic pain.  She was admitted for combination of acute encephalopathy due to a possible combination of pain medication and hyperglycemia.  While in the hospital a wound was noted on her right lower leg which was treated by wound care.  On the morning of 730 CT scan was ordered which showed skin thickening and subcutaneous edema diffusely along the right lower extremity.  Orthopedics was consulted for management  Past Medical History:  Diagnosis Date   Diabetes mellitus    Hypertension    Multiple sclerosis exacerbation (HCC)    Past Surgical History:  Procedure Laterality Date   ABDOMINAL HYSTERECTOMY     partial   JOINT REPLACEMENT     LTK   TUBAL LIGATION     Social History   Socioeconomic History   Marital status: Single    Spouse name: Not on file   Number of children: 2   Years of education: Not on file   Highest education level: Not on file  Occupational History   Not on file  Tobacco Use   Smoking status: Every Day    Packs/day: 0.25    Years: 12.00    Pack years: 3.00    Types: Cigarettes   Smokeless tobacco: Never   Tobacco comments:    06/13/20 1 pk per 3 days  Substance and Sexual Activity   Alcohol use: No   Drug use: No   Sexual activity: Not on file  Other Topics Concern   Not on file  Social History Narrative   06/13/20 lives alone, dgtr across the street   Social Determinants of Health   Financial Resource Strain: Not on file  Food Insecurity: Not on file  Transportation Needs: Not on file   Physical Activity: Not on file  Stress: Not on file  Social Connections: Not on file   Family History  Problem Relation Age of Onset   Diabetes Mother    Hypertension Mother    Stroke Father    No Known Allergies Prior to Admission medications   Medication Sig Start Date End Date Taking? Authorizing Provider  amitriptyline (ELAVIL) 25 MG tablet Take 1 tablet (25 mg total) by mouth at bedtime. 10/07/20  Yes Sater, Nanine Means, MD  ANTI-DIARRHEAL 2 MG tablet Take 2 mg by mouth as needed for diarrhea or loose stools.  07/31/19  Yes [provider]  atorvastatin (LIPITOR) 80 MG tablet Take 1 tablet (80 mg total) by mouth daily. Patient taking differently: Take 80 mg by mouth every evening. 02/24/12  Yes Copland, Gay Filler, MD  baclofen (LIORESAL) 20 MG tablet Take 20 mg by mouth 2 (two) times daily.  08/28/19  Yes [provider]  Biotin w/ Vitamins C & E (HAIR/SKIN/NAILS PO) Take 1 tablet by mouth daily.   Yes [provider]  busPIRone (BUSPAR) 15 MG tablet Take 15 mg by mouth 3 (three) times daily. 10/28/19  Yes [provider]  dalfampridine 10 MG TB12 One po q12 hours Patient taking differently: Take 10 mg by mouth every 12 (twelve) hours. 11/13/20  Yes Sater, Nanine Means, MD  dicyclomine (BENTYL) 20 MG tablet Take 20 mg by mouth in the morning and at bedtime. 10/23/19  Yes [provider]  diphenhydramine-acetaminophen (TYLENOL PM) 25-500 MG TABS tablet Take 1 tablet by mouth at bedtime as needed (pain/sleep).   Yes [provider]  diphenoxylate-atropine (LOMOTIL) 2.5-0.025 MG per tablet Take 1 tablet by mouth 2 (two) times daily. PATIENT NEEDS OFFICE VISIT FOR ADDITIONAL REFILLS Patient taking differently: Take 1 tablet by mouth in the morning and at bedtime. 10/18/12  Yes Copland, Gay Filler, MD  doxycycline (VIBRA-TABS) 100 MG tablet Take 100 mg by mouth 2 (two) times daily.   Yes [provider]  DULoxetine (CYMBALTA) 60 MG capsule  Take 60 mg by mouth 2 (two) times daily.  10/04/19  Yes [provider]  Echinacea 400 MG CAPS Take 400 mg by mouth in the morning and at bedtime.   Yes [provider]  fluticasone (FLONASE) 50 MCG/ACT nasal spray Place 2 sprays into the nose daily. Patient taking differently: Place 1 spray into the nose in the morning and at bedtime. 02/24/12  Yes Copland, Gay Filler, MD  Homeopathic Products (LEG CRAMPS PO) Take 1-2 tablets by mouth daily as needed (For leg cramps).   Yes [provider]  ibuprofen (ADVIL) 800 MG tablet Take 800 mg by mouth 3 (three) times daily as needed for moderate pain.  09/25/19  Yes [provider]  lamoTRIgine (LAMICTAL) 200 MG tablet One po twicw daily Patient taking differently: Take 200 mg by mouth 2 (two) times daily. 06/13/20  Yes Sater, Nanine Means, MD  losartan (COZAAR) 100 MG tablet Take 1 tablet (100 mg total) by mouth daily. Patient taking differently: Take 100 mg by mouth every evening. 02/24/12  Yes Copland, Gay Filler, MD  MAGNESIUM-OXIDE 400 (241.3 Mg) MG tablet Take 1 tablet by mouth daily. 10/18/19  Yes [provider]  MELATONIN MAXIMUM STRENGTH 5 MG TABS Take 15 mg by mouth at bedtime. 07/31/19  Yes [provider]  montelukast (SINGULAIR) 10 MG tablet Take 10 mg by mouth at bedtime. 07/31/19  Yes [provider]  MUCUS RELIEF 400 MG TABS tablet Take 400 mg by mouth every 6 (six) hours as needed (For mucus). 07/31/19  Yes [provider]  Multiple Vitamins-Calcium (ONE-A-DAY WOMENS FORMULA) TABS Take 1 tablet by mouth daily. 07/31/19  Yes [provider]  Multiple Vitamins-Minerals (AIRBORNE) CHEW Chew 3 tablets by mouth daily.  07/31/19  Yes [provider]  NUCYNTA ER 200 MG TB12 Take 1 tablet by mouth 2 (two) times daily. 11/10/19  Yes [provider]  omeprazole (PRILOSEC) 20 MG capsule Take 1 capsule (20 mg total) by mouth daily. 02/24/12  Yes Copland, Gay Filler, MD   oxyCODONE (ROXICODONE) 15 MG immediate release tablet Take 15 mg by mouth every 6 (six) hours as needed for pain. 09/11/19  Yes [provider]  pantoprazole (PROTONIX) 40 MG tablet Take 40 mg by mouth 2 (two) times daily. 10/09/19  Yes [provider]  TECFIDERA 240 MG CPDR Take 1 capsule by mouth 2 (two) times daily. 10/31/19  Yes [provider]  tiZANidine (ZANAFLEX) 2 MG tablet Take 2 mg by mouth 4 (four) times daily. Patient takes up to four times daily 10/23/19  Yes [provider]  VIBERZI 100 MG TABS Take 1 tablet by mouth 2 (two) times daily. 11/07/19  Yes [provider]  vitamin B-12 (CYANOCOBALAMIN) 1000 MCG tablet Take 1,000 mcg  by mouth daily.  07/31/19  Yes [provider]  VITAMIN D PO Take 1 tablet by mouth daily.   Yes [provider]  XIIDRA 5 % SOLN Place 1 drop into both eyes in the morning and at bedtime.  11/10/19  Yes [provider]  zolpidem (AMBIEN) 10 MG tablet Take 10 mg by mouth at bedtime.  10/28/19  Yes [provider]  NARCAN 4 MG/0.1ML LIQD nasal spray kit Place 1 spray into the nose as directed. overdose 10/11/19   [provider]   [place] RT LOWER EXTREM LTD SOFT TISSUE NON VASCULAR  Result Date: 12/14/2020 CLINICAL DATA:  Skin wound on the right lower leg. Possible abscess. EXAM: ULTRASOUND RIGHT LOWER EXTREMITY LIMITED TECHNIQUE: Ultrasound examination of the lower extremity soft tissues was performed in the area of clinical concern. COMPARISON:  CT right lower leg 12/10/2020. FINDINGS: Scanning directed toward the region of concern demonstrates a complex fluid collection measuring 4.0 x 0.9 x 3.2 cm. There is blood flow on Doppler imaging around the collection. No solid lesion is identified. IMPRESSION: Findings most compatible with a subcutaneous abscess in the region of concern. Electronically Signed   By: Inge Rise M.D.   On: 12/14/2020 11:11    Positive ROS: All other  systems have been reviewed and were otherwise negative with the exception of those mentioned in the HPI and as above.  Physical Exam: General: Alert, no acute distress Cardiovascular: No pedal edema Respiratory: No cyanosis, no use of accessory musculature GI: No organomegaly, abdomen is soft and non-tender Skin: Skin wound with erythema noted in the right lower extremity.  Fluctuance noted around the wound Neurologic: Sensation intact distally Psychiatric: Patient is competent for consent with normal mood and affect Lymphatic: No axillary or cervical lymphadenopathy  MUSCULOSKELETAL: Right lower extremity range of motion intact.  Palpable pedal pulse.  Assessment: Subcutaneous abscess of the right lower extremity  Plan: Subcutaneous abscess of the right lower extremity: I discussed the findings with the patient.  Based on CT and physical exam I believe she has an abscess.  I recommend surgical washout and debridement.  I discussed risks and benefits with the patient and the patient would like to proceed.  Surgery scheduled for this afternoon.  Patient to remain n.p.o. until that time.    Dorothyann Peng, PA [phone]    12/14/2020 3:10 PM

## 2020-12-14 NOTE — Transfer of Care (Signed)
Immediate Anesthesia Transfer of Care Note  Patient: [name]  Procedure(s) Performed: IRRIGATION AND DEBRIDEMENT RIGHT LOWER EXTREMITY (Right: Leg Lower)  Patient Location: PACU  Anesthesia Type:General  Level of Consciousness: drowsy and patient cooperative  Airway & Oxygen Therapy: Patient Spontanous Breathing and Patient connected to nasal cannula oxygen  Post-op Assessment: Report given to RN and Post -op Vital signs reviewed and stable  Post vital signs: Reviewed and stable  Last Vitals:  Vitals Value Taken Time  BP 175/98 12/14/20 1815  Temp    Pulse 94 12/14/20 1816  Resp 21 12/14/20 1816  SpO2 90 % 12/14/20 1816  Vitals shown include unvalidated device data.  Last Pain:  Vitals:   12/14/20 1656  TempSrc: Oral  PainSc: 10-Worst pain ever      Patients Stated Pain Goal: 4 (12/14/20 1656)  Complications: No notable events documented.

## 2020-12-14 NOTE — Progress Notes (Signed)
Progress Note    [name]  WPY:[id number] DOB: 10/14/1960  DOA: 12/10/2020 PCP: Felix Pacini, FNP    Brief Narrative:     Medical records reviewed and are as summarized below:  [name] is an 60 y.o. female with history of multiple sclerosis, diabetes mellitus, hypertension, hyperlipidemia and chronic pain was brought to the ER after patient's family felt that patient was getting increasingly confused over the last 24 hours.  Found to have new onset DM.  Home in the AM.     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   -resolved  Hyperosmolar nonketotic hyperglycemia/new onset DM -patient's home medication list does not show any antidiabetic medication -SSI and lantus -appreciate diabetic coordinator's help  -- ? SSI vs schedule meal novolog -asked nurse to educate patient on giving shots/checking blood sugar  Right leg wound with ulceration  -WOC: Insert into the wound including the undermining area a piece of 1" Iodoform packing strip [facility] [place] # [phone]) cover with a 4 x 4 and secure with foam dressing. Change the Iodoform twice daily.  -CT scan: Skin thickening and subcutaneous edema diffusely along the right lower extremity as can be seen in cellulitis. Soft tissue defect medially at the level of the mid tibia with adjacent soft tissue gas and swelling but no well-defined fluid collection or findings to suggest underlying osteomyelitis on noncontrast CT. -on AM of 7/30 patient developed an increased abscess to the left of the ulcer-- ortho consulted for I/D -change abx back to IV as PO  not enough  Hypertension  -resume home meds  Hypokalemia -replete  History of multiple sclerosis  -resume home meds  Hyperlipidemia -statin   obesity Body mass index is 48.89 kg/m.   Family Communication/Anticipated D/C date and plan/Code Status   DVT prophylaxis: Lovenox ordered. Code Status: Full Code.  Disposition Plan: Status is: Inpatient  Remains inpatient appropriate because:Inpatient level of care appropriate due to severity of illness  Dispo: The patient is from: Home              Anticipated d/c is to: Home              Patient currently is not medically stable to d/c. Home in AM when daughter able to be with her   Difficult to place patient No         Medical Consultants:   ortho   Subjective:   Says a bump developed overnight on her leg   Objective:    Vitals:   12/13/20 1416 12/13/20 2048 12/14/20 0640 12/14/20 1219  BP: 133/81 (!) 157/78 (!) 147/68 130/81  Pulse: 89 (!) 105 (!) 101 96  Resp:  16 14 15   Temp: 97.6 F (36.4 C) 98.2 F (36.8 C) 98.1 F (36.7 C) 98.4 F (36.9 C)  TempSrc: Oral   Oral  SpO2: 99% 97% 100% 98%  Weight:      Height:        Intake/Output Summary (Last 24 hours) at 12/14/2020 1528 Last data filed at 12/14/2020 1220 Gross per 24 hour  Intake 360 ml  Output 701 ml  Net -341 ml   Filed Weights   12/10/20 0500  Weight: 125.2 kg    Exam:   General: Appearance:    Severely obese female in no acute distress     Lungs:      respirations unlabored  Heart:    Normal heart rate. Normal rhythm. No murmurs, rubs, or gallops.    MS:   All extremities are intact. Enlarging fluctuant area on right LE   Neurologic:   Awake, alert, oriented x 3.       Data Reviewed:   I have personally reviewed following labs and imaging studies:  Labs: Labs show the following:   Basic Metabolic Panel: Recent Labs  Lab 12/10/20 0822 12/10/20 1755 12/11/20 0212 12/12/20 0208 12/13/20 0752  NA 148* 142 141 135  136  K 3.6 3.4* 3.7 2.9* 3.5  CL 113* 109 106 102 105  CO2 27 27 24 26 24   GLUCOSE 285* 188* 278* 227* 342*  BUN 9 5* 5* 7 7  CREATININE 0.72 0.60 0.73 0.62 0.84  CALCIUM 9.6 9.5 9.0 9.2 9.4  MG  --   --   --  1.8  --    GFR Estimated Creatinine Clearance: 91.6 mL/min (by C-G formula based on SCr of 0.84 mg/dL). Liver Function Tests: Recent Labs  Lab 12/10/20 0300  AST 14*  ALT 25  ALKPHOS 84  BILITOT 1.0  PROT 7.2  ALBUMIN 3.8   No results for input(s): LIPASE, AMYLASE in the last 168 hours. Recent Labs  Lab 12/10/20 0300  AMMONIA 21   Coagulation profile No results for input(s): INR, PROTIME in the last 168 hours.  CBC: Recent Labs  Lab 12/10/20 0300 12/10/20 0357 12/10/20 0822 12/11/20 0212  WBC 7.7  --  10.6* 9.3  NEUTROABS 4.4  --   --   --   HGB 14.5 13.6 13.3 14.5  HCT 41.9 40.0 39.6 42.5  MCV 88.8  --  89.4 89.9  PLT 252  --  237 193   Cardiac Enzymes: No results for input(s): CKTOTAL, CKMB, CKMBINDEX, TROPONINI in the last 168 hours. BNP (last 3 results) No results for input(s): PROBNP in the last 8760 hours. CBG: Recent Labs  Lab 12/13/20 2049 12/14/20 0625 12/14/20 0741 12/14/20 1215 12/14/20 1522  GLUCAP 350* 261* 308* 269* 127*   D-Dimer: No results for input(s): DDIMER in the last 72 hours. Hgb A1c: No results for input(s): HGBA1C in the last 72 hours.  Lipid Profile: No results for input(s): CHOL, HDL, LDLCALC, TRIG, CHOLHDL, LDLDIRECT in the last 72 hours. Thyroid function studies: No results for input(s): TSH, T4TOTAL, T3FREE, THYROIDAB in the last 72 hours.  Invalid input(s): FREET3  Anemia work up: No results for input(s): VITAMINB12, FOLATE, FERRITIN, TIBC, IRON, RETICCTPCT in the last 72 hours. Sepsis Labs: Recent Labs  Lab 12/10/20 0300 12/10/20 0822 12/11/20 0212  WBC 7.7 10.6* 9.3  LATICACIDVEN 1.4  --   --     Microbiology Recent Results (from the past 240 hour(s))  Blood culture (routine x 2)     Status:  None (Preliminary result)   Collection Time: 12/10/20  3:10 AM   Specimen: BLOOD  Result Value Ref Range Status   Specimen Description BLOOD LEFT ANTECUBITAL  Final   Special Requests   Final    BOTTLES DRAWN AEROBIC AND ANAEROBIC Blood Culture adequate volume   Culture   Final    NO GROWTH 2 DAYS Performed at [facility], [address] [place], [place] [address]    Report Status PENDING  Incomplete  Blood culture (routine x 2)     Status: None (Preliminary result)   Collection Time: 12/10/20  3:10 AM   Specimen: BLOOD  Result Value Ref Range Status   Specimen Description BLOOD RIGHT ANTECUBITAL  Final   Special Requests   Final    BOTTLES DRAWN AEROBIC AND ANAEROBIC Blood Culture adequate volume   Culture   Final    NO GROWTH 2 DAYS Performed at [facility], [address] [place], [place] [address]    Report Status PENDING  Incomplete  Resp Panel by RT-PCR (Flu A&B, Covid) Nasopharyngeal Swab     Status: None   Collection Time: 12/10/20  4:34 AM   Specimen: Nasopharyngeal Swab; Nasopharyngeal(NP) swabs in vial transport medium  Result Value Ref Range Status   SARS Coronavirus 2 by RT PCR NEGATIVE NEGATIVE Final    Comment: (NOTE) SARS-CoV-2 target nucleic acids are NOT DETECTED.  The SARS-CoV-2 RNA is generally detectable in upper respiratory specimens during the acute phase of infection. The lowest concentration of SARS-CoV-2 viral copies this assay can detect is 138 copies/mL. A negative result does not preclude SARS-Cov-2 infection and should not be used as the sole basis for treatment or other patient management decisions. A negative result may occur with  improper specimen collection/handling, submission of specimen other than nasopharyngeal swab, presence of viral mutation(s) within the areas targeted by this assay, and inadequate number of viral copies(<138 copies/mL). A negative result must be combined with clinical observations, patient history,  and epidemiological information. The expected result is Negative.  Fact Sheet for Patients:  BloggerCourse.com  Fact Sheet for Healthcare Providers:  SeriousBroker.it  This test is no t yet approved or cleared by the [place] FDA and  has been authorized for detection and/or diagnosis of SARS-CoV-2 by FDA under an Emergency Use Authorization (EUA). This EUA will remain  in effect (meaning this test can be used) for the duration of the COVID-19 declaration under Section 564(b)(1) of the Act, 21 U.S.C.section 360bbb-3(b)(1), unless the authorization is terminated  or revoked sooner.       Influenza A by PCR NEGATIVE NEGATIVE Final   Influenza B by PCR NEGATIVE NEGATIVE Final    Comment: (NOTE) The Xpert Xpress SARS-CoV-2/FLU/RSV plus assay is intended as an aid in the diagnosis of influenza from Nasopharyngeal swab specimens and should not be used as a sole basis for treatment. Nasal washings and aspirates are unacceptable for Xpert Xpress SARS-CoV-2/FLU/RSV testing.  Fact Sheet for Patients: BloggerCourse.com  Fact Sheet for Healthcare Providers: SeriousBroker.it  This test is not yet approved or cleared by the [place] FDA and has been authorized for detection and/or diagnosis of SARS-CoV-2 by FDA under an Emergency Use Authorization (EUA). This EUA will remain in effect (meaning this test can be used) for the duration of the COVID-19 declaration under Section 564(b)(1) of the Act, 21 U.S.C. section 360bbb-3(b)(1), unless the authorization is terminated or revoked.  Performed at [facility], [address], [place], [place] [address]     Procedures and diagnostic studies:  [place] RT LOWER EXTREM LTD SOFT TISSUE NON VASCULAR  Result Date: 12/14/2020 CLINICAL DATA:  Skin wound on the right lower leg. Possible abscess. EXAM: ULTRASOUND RIGHT LOWER EXTREMITY  LIMITED TECHNIQUE: Ultrasound examination of the lower extremity soft tissues was performed in the area of clinical concern. COMPARISON:  CT right lower leg 12/10/2020. FINDINGS: Scanning directed toward  the region of concern demonstrates a complex fluid collection measuring 4.0 x 0.9 x 3.2 cm. There is blood flow on Doppler imaging around the collection. No solid lesion is identified. IMPRESSION: Findings most compatible with a subcutaneous abscess in the region of concern. Electronically Signed   By: Drusilla Kannerhomas  Dalessio M.D.   On: 12/14/2020 11:11    Medications:    amitriptyline  25 mg Oral QHS   artificial tears   Both Eyes BID   busPIRone  15 mg Oral TID   carvedilol  6.25 mg Oral BID WC   dalfampridine  10 mg Oral Q12H   dicyclomine  20 mg Oral BID   Dimethyl Fumarate  1 capsule Oral BID   DULoxetine  60 mg Oral BID   Eluxadoline  1 tablet Oral BID   enoxaparin (LOVENOX) injection  60 mg Subcutaneous Q24H   hydrocerin   Topical BID   insulin aspart  0-20 Units Subcutaneous TID WC   insulin aspart  0-5 Units Subcutaneous QHS   insulin glargine-yfgn  48 Units Subcutaneous Daily   lamoTRIgine  200 mg Oral BID   losartan  100 mg Oral QPM   tiZANidine  2 mg Oral QID   Continuous Infusions:  cefTRIAXone (ROCEPHIN)  IV     And   metronidazole        LOS: 3 days   Joseph ArtJessica U Hamna Asa  [facility]   How to contact the [facility] Attending or Consulting provider 7A - 7P or covering provider during after hours 7P -7A, for this patient?  Check the care team in [facility] and look for a) attending/consulting [facility] provider listed and b) the [facility] team listed Log into [email or web address] and use [place]'s universal password to access. If you do not have the password, please contact the hospital operator. Locate the [facility] provider you are looking for under [facility] and page to a number that you can be directly reached. If you still have difficulty reaching the provider, please page the [facility] (Director  on Call) for the Hospitalists listed on amion for assistance.  12/14/2020, 3:28 PM

## 2020-12-15 DIAGNOSIS — G9340 Encephalopathy, unspecified: Secondary | ICD-10-CM | POA: Diagnosis not present

## 2020-12-15 LAB — BASIC METABOLIC PANEL
Anion gap: 9 (ref 5–15)
BUN: <5 mg/dL — ABNORMAL LOW (ref 6–20)
CO2: 25 mmol/L (ref 22–32)
Calcium: 8.9 mg/dL (ref 8.9–10.3)
Chloride: 104 mmol/L (ref 98–111)
Creatinine, Ser: 0.66 mg/dL (ref 0.44–1.00)
GFR, Estimated: >60 mL/min (ref >60)
Glucose, Bld: 273 mg/dL — ABNORMAL HIGH (ref 70–99)
Potassium: 3.4 mmol/L — ABNORMAL LOW (ref 3.5–5.1)
Sodium: 138 mmol/L (ref 135–145)

## 2020-12-15 LAB — CULTURE, BLOOD (ROUTINE X 2)
Culture: NO GROWTH
Culture: NO GROWTH
Special Requests: ADEQUATE
Special Requests: ADEQUATE

## 2020-12-15 LAB — GLUCOSE, CAPILLARY
Glucose-Capillary: 216 mg/dL — ABNORMAL HIGH (ref 70–99)
Glucose-Capillary: 184 mg/dL — ABNORMAL HIGH (ref 70–99)
Glucose-Capillary: 285 mg/dL — ABNORMAL HIGH (ref 70–99)
Glucose-Capillary: 164 mg/dL — ABNORMAL HIGH (ref 70–99)
Glucose-Capillary: 254 mg/dL — ABNORMAL HIGH (ref 70–99)

## 2020-12-15 MED ORDER — POTASSIUM CHLORIDE CRYS ER 20 MEQ PO TBCR
40.0000 meq | EXTENDED_RELEASE_TABLET | Freq: Once | ORAL | Status: AC
  Administered 2020-12-15: 40 meq via ORAL
  Filled 2020-12-15: qty 2

## 2020-12-15 MED ORDER — OXYCODONE HCL 5 MG PO TABS
5.0000 mg | ORAL_TABLET | Freq: Once | ORAL | Status: AC
Start: 2020-12-15 — End: 2020-12-15
  Administered 2020-12-15: 5 mg via ORAL
  Filled 2020-12-15: qty 1

## 2020-12-15 NOTE — Progress Notes (Signed)
Pt reporting burning 8/10 leg pain from post op I&D site despite PRN analgesic administration. MD notified, orders placed for 1x 5mg  oxycodone IR.

## 2020-12-15 NOTE — Progress Notes (Signed)
   Subjective: 1 Day Post-Op Procedure(s) (LRB): IRRIGATION AND DEBRIDEMENT RIGHT LOWER EXTREMITY (Right) Patient reports pain as mild.   Patient seen in rounds for Dr. Linna Caprice. Patient is well, and has had no acute complaints or problems. She is resting in bed comfortably this morning. She reports that she has had some pain in the RLE.   Objective: Vital signs in last 24 hours: Temp:  [97.8 F (36.6 C)-98.4 F (36.9 C)] 98 F (36.7 C) (07/31 0502) Pulse Rate:  [87-103] 87 (07/31 0502) Resp:  [15-26] 20 (07/31 0502) BP: (107-178)/(59-98) 109/59 (07/31 0502) SpO2:  [89 %-100 %] 94 % (07/31 0502)  Intake/Output from previous day:  Intake/Output Summary (Last 24 hours) at 12/15/2020 0802 Last data filed at 12/15/2020 0801 Gross per 24 hour  Intake 120 ml  Output 970 ml  Net -850 ml     Intake/Output this shift: Total I/O In: -  Out: 300 [Urine:300]  Labs: No results for input(s): HGB in the last 72 hours. No results for input(s): WBC, RBC, HCT, PLT in the last 72 hours. Recent Labs    12/13/20 0752 12/15/20 0050  NA 136 138  K 3.5 3.4*  CL 105 104  CO2 24 25  BUN 7 <5*  CREATININE 0.84 0.66  GLUCOSE 342* 273*  CALCIUM 9.4 8.9   No results for input(s): LABPT, INR in the last 72 hours.  Exam: General - Patient is Alert and Oriented Extremity - Neurologically intact Sensation intact distally Intact pulses distally Dorsiflexion/Plantar flexion intact Dressing - Mild drainage on the kerlex dressing. ACE wrap in place over top  Motor Function - intact, moving foot and toes well on exam.   Past Medical History:  Diagnosis Date   Diabetes mellitus    Hypertension    Multiple sclerosis exacerbation (HCC)    Neuropathy     Assessment/Plan: 1 Day Post-Op Procedure(s) (LRB): IRRIGATION AND DEBRIDEMENT RIGHT LOWER EXTREMITY (Right) Principal Problem:   Acute encephalopathy Active Problems:   Multiple sclerosis (HCC)   Essential hypertension   Hyperosmolar  non-ketotic state due to type 2 diabetes mellitus (HCC)   Leg wound, right, initial encounter   Hyperglycemia  Estimated body mass index is 48.89 kg/m as calculated from the following:   Height as of this encounter: 5\' 3"  (1.6 m).   Weight as of this encounter: 125.2 kg.   Intra-op gram stain reveals gram + rods and Gram + cocci in clusters, awaiting culture & sensitivity.  Based on surgery yesterday evening, we will hold off on re-packing the wound until tomorrow. Continue ABX & pain control today.   , PA-C Orthopedic Surgery [phone] 12/15/2020, 8:02 AM

## 2020-12-15 NOTE — Progress Notes (Signed)
Progress Note    SHALEKA OFFILL  TEL:[id number] DOB: Mar 28, 1961  DOA: 12/10/2020 PCP: Felix Pacini, FNP    Brief Narrative:     Medical records reviewed and are as summarized below:  KASHE TREGER is an 60 y.o. female with history of multiple sclerosis, diabetes mellitus, hypertension, hyperlipidemia and chronic pain was brought to the ER after patient's family felt that patient was getting increasingly confused over the last 24 hours.  Found to have new onset DM.  Worsening area of infection on 7/30 so ortho called for I/D.  Cultures pending.     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   -resolved  Hyperosmolar nonketotic hyperglycemia/new onset DM -patient's home medication list does not show any antidiabetic medication -SSI and lantus -appreciate diabetic coordinator's help  -- will do SSI at home -asked nurse to educate patient on giving shots/checking blood sugar  Right leg wound with ulceration  -on AM of 7/30 patient developed an increased abscess to the left of the ulcer-- ortho consulted for I/D- done with culture pending on 7/30 PM -IV abx while culture pending -missed outpatient appointment with Dr. Nelda Severe ([facility]) so will need new one upon d/c  Hypertension  -resume home meds  Hypokalemia -replete  History of multiple sclerosis  -resume home meds  Hyperlipidemia -statin   obesity Body mass index is 48.89 kg/m.   Family Communication/Anticipated D/C date and plan/Code Status   DVT prophylaxis: Lovenox ordered. Code Status: Full Code.  Disposition Plan: Status  is: Inpatient  Remains inpatient appropriate because:Inpatient level of care appropriate due to severity of illness  Dispo: The patient is from: Home              Anticipated d/c is to: Home              Patient currently is not medically stable to d/c. Await culture   Difficult to place patient No         Medical Consultants:   ortho   Subjective:   Mild pain in leg this AM, no SOB, no CP   Objective:    Vitals:   12/14/20 2043 12/14/20 2307 12/15/20 0435 12/15/20 0502  BP: (!) 160/80 (!) 146/59 107/61 (!) 109/59  Pulse: 99 (!) 103 91 87  Resp: 18  19 20   Temp: 98.2 F (36.8 C) 98 F (36.7 C) 98.3 F (36.8 C) 98 F (36.7 C)  TempSrc: Oral Oral Oral   SpO2: 95% 93% 90% 94%  Weight:      Height:        Intake/Output Summary (Last 24 hours) at 12/15/2020 0844 Last data filed at 12/15/2020 0801 Gross per 24 hour  Intake 120 ml  Output 970 ml  Net -850 ml   Filed Weights   12/10/20 0500  Weight: 125.2 kg    Exam:   General: Appearance:    Severely obese female in no acute distress     Lungs:     respirations unlabored  Heart:    Normal heart rate.    MS:   All extremities are intact. - right leg wrapped  Neurologic:   Awake, alert, oriented x 3. No apparent focal neurological           defect.          Data Reviewed:   I have personally reviewed following labs and imaging studies:  Labs: Labs show the following:   Basic Metabolic Panel: Recent Labs  Lab 12/10/20 1755 12/11/20 0212 12/12/20 0208 12/13/20 0752 12/15/20 0050  NA 142 141 135 136 138  K 3.4* 3.7 2.9* 3.5 3.4*  CL 109 106 102 105 104  CO2 27 24 26 24 25   GLUCOSE 188* 278* 227* 342* 273*  BUN 5* 5* 7 7 <5*  CREATININE 0.60 0.73 0.62 0.84 0.66  CALCIUM 9.5 9.0 9.2 9.4 8.9  MG  --   --  1.8  --   --    GFR Estimated Creatinine Clearance: 96.2 mL/min (by C-G formula based on SCr of 0.66 mg/dL). Liver Function Tests: Recent Labs  Lab 12/10/20 0300  AST 14*  ALT 25   ALKPHOS 84  BILITOT 1.0  PROT 7.2  ALBUMIN 3.8   No results for input(s): LIPASE, AMYLASE in the last 168 hours. Recent Labs  Lab 12/10/20 0300  AMMONIA 21   Coagulation profile No results for input(s): INR, PROTIME in the last 168 hours.  CBC: Recent Labs  Lab 12/10/20 0300 12/10/20 0357 12/10/20 0822 12/11/20 0212  WBC 7.7  --  10.6* 9.3  NEUTROABS 4.4  --   --   --   HGB 14.5 13.6 13.3 14.5  HCT 41.9 40.0 39.6 42.5  MCV 88.8  --  89.4 89.9  PLT 252  --  237 193   Cardiac Enzymes: No results for input(s): CKTOTAL, CKMB, CKMBINDEX, TROPONINI in the last 168 hours. BNP (last 3 results) No results for input(s): PROBNP in the last 8760 hours. CBG: Recent Labs  Lab 12/14/20 1704 12/14/20 1819 12/14/20 2048 12/15/20 0648 12/15/20 0733  GLUCAP 95 79 88 216* 184*   D-Dimer: No results for input(s): DDIMER in the last 72 hours. Hgb A1c: No results for input(s): HGBA1C in the last 72 hours.  Lipid Profile: No results for input(s): CHOL, HDL, LDLCALC, TRIG, CHOLHDL, LDLDIRECT in the last 72 hours. Thyroid function studies: No results for input(s): TSH, T4TOTAL, T3FREE, THYROIDAB in the last 72 hours.  Invalid input(s): FREET3  Anemia work up: No results for input(s): VITAMINB12, FOLATE, FERRITIN, TIBC, IRON, RETICCTPCT in the last 72 hours. Sepsis Labs: Recent Labs  Lab 12/10/20 0300 12/10/20 0822 12/11/20 0212  WBC 7.7 10.6* 9.3  LATICACIDVEN 1.4  --   --     Microbiology Recent Results (from the past 240 hour(s))  Blood culture (routine x 2)     Status: None (Preliminary result)   Collection Time: 12/10/20  3:10 AM   Specimen: BLOOD  Result Value Ref Range Status   Specimen Description BLOOD LEFT ANTECUBITAL  Final   Special Requests   Final    BOTTLES DRAWN AEROBIC AND ANAEROBIC Blood Culture adequate volume   Culture   Final    NO GROWTH 4 DAYS Performed at [facility], [address], [place], [place]    Report Status  PENDING  Incomplete  Blood culture (routine x 2)     Status: None (Preliminary result)   Collection Time: 12/10/20  3:10 AM   Specimen: BLOOD  Result Value Ref Range Status   Specimen Description BLOOD RIGHT ANTECUBITAL  Final   Special Requests   Final  BOTTLES DRAWN AEROBIC AND ANAEROBIC Blood Culture adequate volume   Culture   Final    NO GROWTH 4 DAYS Performed at [facility], [address], [place], [place] [address]    Report Status PENDING  Incomplete  Resp Panel by RT-PCR (Flu A&B, Covid) Nasopharyngeal Swab     Status: None   Collection Time: 12/10/20  4:34 AM   Specimen: Nasopharyngeal Swab; Nasopharyngeal(NP) swabs in vial transport medium  Result Value Ref Range Status   SARS Coronavirus 2 by RT PCR NEGATIVE NEGATIVE Final    Comment: (NOTE) SARS-CoV-2 target nucleic acids are NOT DETECTED.  The SARS-CoV-2 RNA is generally detectable in upper respiratory specimens during the acute phase of infection. The lowest concentration of SARS-CoV-2 viral copies this assay can detect is 138 copies/mL. A negative result does not preclude SARS-Cov-2 infection and should not be used as the sole basis for treatment or other patient management decisions. A negative result may occur with  improper specimen collection/handling, submission of specimen other than nasopharyngeal swab, presence of viral mutation(s) within the areas targeted by this assay, and inadequate number of viral copies(<138 copies/mL). A negative result must be combined with clinical observations, patient history, and epidemiological information. The expected result is Negative.  Fact Sheet for Patients:  BloggerCourse.com  Fact Sheet for Healthcare Providers:  SeriousBroker.it  This test is no t yet approved or cleared by the [place] FDA and  has been authorized for detection and/or diagnosis of SARS-CoV-2 by FDA under an Emergency Use  Authorization (EUA). This EUA will remain  in effect (meaning this test can be used) for the duration of the COVID-19 declaration under Section 564(b)(1) of the Act, 21 U.S.C.section 360bbb-3(b)(1), unless the authorization is terminated  or revoked sooner.       Influenza A by PCR NEGATIVE NEGATIVE Final   Influenza B by PCR NEGATIVE NEGATIVE Final    Comment: (NOTE) The Xpert Xpress SARS-CoV-2/FLU/RSV plus assay is intended as an aid in the diagnosis of influenza from Nasopharyngeal swab specimens and should not be used as a sole basis for treatment. Nasal washings and aspirates are unacceptable for Xpert Xpress SARS-CoV-2/FLU/RSV testing.  Fact Sheet for Patients: BloggerCourse.com  Fact Sheet for Healthcare Providers: SeriousBroker.it  This test is not yet approved or cleared by the [place] FDA and has been authorized for detection and/or diagnosis of SARS-CoV-2 by FDA under an Emergency Use Authorization (EUA). This EUA will remain in effect (meaning this test can be used) for the duration of the COVID-19 declaration under Section 564(b)(1) of the Act, 21 U.S.C. section 360bbb-3(b)(1), unless the authorization is terminated or revoked.  Performed at [facility], [address], [place], [place] [address]   Aerobic/Anaerobic Culture w Gram Stain (surgical/deep wound)     Status: None (Preliminary result)   Collection Time: 12/14/20  6:00 PM   Specimen: Abscess  Result Value Ref Range Status   Specimen Description ABSCESS  Final   Special Requests RIGHT LOWER LEG SPEC A  Final   Gram Stain   Final    ABUNDANT WBC PRESENT, PREDOMINANTLY PMN RARE GRAM POSITIVE RODS RARE GRAM POSITIVE COCCI IN CLUSTERS Performed at [facility], [address] [place], [place] [address]    Culture PENDING  Incomplete   Report Status PENDING  Incomplete    Procedures and diagnostic studies:  [place] RT LOWER EXTREM LTD  SOFT TISSUE NON VASCULAR  Result Date: 12/14/2020 CLINICAL DATA:  Skin wound  on the right lower leg. Possible abscess. EXAM: ULTRASOUND RIGHT LOWER EXTREMITY LIMITED TECHNIQUE: Ultrasound examination of the lower extremity soft tissues was performed in the area of clinical concern. COMPARISON:  CT right lower leg 12/10/2020. FINDINGS: Scanning directed toward the region of concern demonstrates a complex fluid collection measuring 4.0 x 0.9 x 3.2 cm. There is blood flow on Doppler imaging around the collection. No solid lesion is identified. IMPRESSION: Findings most compatible with a subcutaneous abscess in the region of concern. Electronically Signed   By: Drusilla Kanner M.D.   On: 12/14/2020 11:11    Medications:    amitriptyline  25 mg Oral QHS   artificial tears   Both Eyes BID   busPIRone  15 mg Oral TID   dalfampridine  10 mg Oral Q12H   dicyclomine  20 mg Oral BID   Dimethyl Fumarate  1 capsule Oral BID   docusate sodium  100 mg Oral BID   DULoxetine  60 mg Oral BID   Eluxadoline  1 tablet Oral BID   enoxaparin (LOVENOX) injection  60 mg Subcutaneous Q24H   hydrocerin   Topical BID   insulin aspart  0-20 Units Subcutaneous TID WC   insulin aspart  0-5 Units Subcutaneous QHS   insulin glargine-yfgn  48 Units Subcutaneous Daily   lamoTRIgine  200 mg Oral BID   losartan  100 mg Oral QPM   tiZANidine  2 mg Oral QID   Continuous Infusions:  cefTRIAXone (ROCEPHIN)  IV     And   metronidazole 500 mg (12/15/20 0651)      LOS: 4 days   Joseph Art  [facility]   How to contact the [facility] Attending or Consulting provider 7A - 7P or covering provider during after hours 7P -7A, for this patient?  Check the care team in [facility] and look for a) attending/consulting [facility] provider listed and b) the [facility] team listed Log into [email or web address] and use 's universal password to access. If you do not have the password, please contact the hospital operator. Locate the [facility] provider  you are looking for under [facility] and page to a number that you can be directly reached. If you still have difficulty reaching the provider, please page the [facility] (Director on Call) for the Hospitalists listed on amion for assistance.  12/15/2020, 8:44 AM

## 2020-12-15 NOTE — Plan of Care (Signed)
  Problem: Education: Goal: Knowledge of General Education information will improve Description: Including pain rating scale, medication(s)/side effects and non-pharmacologic comfort measures Outcome: Progressing   Problem: Health Behavior/Discharge Planning: Goal: Ability to manage health-related needs will improve Outcome: Progressing   Problem: Clinical Measurements: Goal: Ability to maintain clinical measurements within normal limits will improve Outcome: Progressing Goal: Diagnostic test results will improve Outcome: Progressing Goal: Respiratory complications will improve Outcome: Progressing Goal: Cardiovascular complication will be avoided Outcome: Progressing   Problem: Nutrition: Goal: Adequate nutrition will be maintained Outcome: Progressing   Problem: Coping: Goal: Level of anxiety will decrease Outcome: Progressing   Problem: Elimination: Goal: Will not experience complications related to urinary retention Outcome: Progressing   Problem: Pain Managment: Goal: General experience of comfort will improve Outcome: Progressing   Problem: Safety: Goal: Ability to remain free from injury will improve Outcome: Progressing

## 2020-12-16 ENCOUNTER — Encounter (HOSPITAL_COMMUNITY): Payer: Self-pay | Admitting: Orthopedic Surgery

## 2020-12-16 DIAGNOSIS — G9340 Encephalopathy, unspecified: Secondary | ICD-10-CM | POA: Diagnosis not present

## 2020-12-16 LAB — GLUCOSE, CAPILLARY
Glucose-Capillary: 201 mg/dL — ABNORMAL HIGH (ref 70–99)
Glucose-Capillary: 245 mg/dL — ABNORMAL HIGH (ref 70–99)
Glucose-Capillary: 261 mg/dL — ABNORMAL HIGH (ref 70–99)
Glucose-Capillary: 200 mg/dL — ABNORMAL HIGH (ref 70–99)

## 2020-12-16 MED ORDER — OXYCODONE HCL 5 MG PO TABS
10.0000 mg | ORAL_TABLET | Freq: Four times a day (QID) | ORAL | Status: DC | PRN
  Administered 2020-12-16 – 2020-12-17 (×2): 15 mg via ORAL
  Administered 2020-12-17: 10 mg via ORAL
  Administered 2020-12-18 (×2): 15 mg via ORAL
  Filled 2020-12-16 (×4): qty 3
  Filled 2020-12-16: qty 2

## 2020-12-16 MED ORDER — INSULIN GLARGINE-YFGN 100 UNIT/ML ~~LOC~~ SOLN
50.0000 [IU] | Freq: Every day | SUBCUTANEOUS | Status: DC
  Administered 2020-12-16 – 2020-12-18 (×3): 50 [IU] via SUBCUTANEOUS
  Filled 2020-12-16 (×4): qty 0.5

## 2020-12-16 NOTE — Progress Notes (Signed)
Inpatient Diabetes Program Recommendations  AACE/ADA: New Consensus Statement on Inpatient Glycemic Control (2015)  Target Ranges:  Prepandial:   less than 140 mg/dL      Peak postprandial:   less than 180 mg/dL (1-2 hours)      Critically ill patients:  140 - 180 mg/dL   Lab Results  Component Value Date   GLUCAP 245 (H) 12/16/2020   HGBA1C 10.6 (H) 12/10/2020    Review of Glycemic Control Results for DELLAR, TRABER (MRN [id number]) as of 12/16/2020 14:06  Ref. Range 12/15/2020 11:27 12/15/2020 15:44 12/15/2020 22:19 12/16/2020 08:12 12/16/2020 11:44  Glucose-Capillary Latest Ref Range: 70 - 99 mg/dL 919 (H) 166 (H) 060 (H) 201 (H) 245 (H)   Diabetes history: DM 2-new diagnosis Current orders for Inpatient glycemic control:  Novolog resistant tid with meals and HS Glargine-yfgn (Semglee) 50 units daily Inpatient Diabetes Program Recommendations:    May consider adding Novolog 4 units tid with meals (hold if patient eats less than 50% or NPO).  Thanks,  Beryl Meager, RN, BC-ADM Inpatient Diabetes Coordinator Pager [phone]  (8a-5p)

## 2020-12-16 NOTE — Progress Notes (Signed)
Progress Note    [name]  EHM:[id number] DOB: 1960/11/27  DOA: 12/10/2020 PCP: Felix Pacini, FNP    Brief Narrative:     Medical records reviewed and are as summarized below:  [name] is an 60 y.o. female with history of multiple sclerosis, diabetes mellitus, hypertension, hyperlipidemia and chronic pain was brought to the ER after patient's family felt that patient was getting increasingly confused over the last 24 hours.  Found to have new onset DM.  Worsening area of infection on 7/30 so ortho called for I/D.  Cultures pending.     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   -resolved  Hyperosmolar nonketotic hyperglycemia/new onset DM -patient's home medication list does not show any antidiabetic medication -SSI and lantus -appreciate diabetic coordinator's help  -- will do SSI at home -asked nurse to educate patient on giving shots/checking blood sugar  Right leg wound with ulceration  -on AM of 7/30 patient developed an increased abscess to the left of the ulcer-- ortho consulted for I/D- done with culture pending on 7/30 PM -IV abx while culture pending -missed outpatient appointment with Dr. Nelda Severe ([facility]) so will need new one upon d/c  Hypertension  -resume home meds  Hypokalemia -replete  History of multiple sclerosis  -resume home meds  Hyperlipidemia -statin   obesity Body mass index is 48.89 kg/m.   Family Communication/Anticipated D/C date and plan/Code Status   DVT prophylaxis: Lovenox ordered. Code Status: Full Code.  Disposition Plan: Status  is: Inpatient  Remains inpatient appropriate because:Inpatient level of care appropriate due to severity of illness  Dispo: The patient is from: Home              Anticipated d/c is to: Home              Patient currently is not medically stable to d/c. Await culture   Difficult to place patient No         Medical Consultants:   ortho   Subjective:   Some pain in leg   Objective:    Vitals:   12/15/20 0435 12/15/20 0502 12/15/20 2100 12/16/20 0502  BP: 107/61 (!) 109/59 108/60 (!) 175/114  Pulse: 91 87 88 100  Resp: 19 20 20 20   Temp: 98.3 F (36.8 C) 98 F (36.7 C) 98 F (36.7 C) 98 F (36.7 C)  TempSrc: Oral  Oral Oral  SpO2: 90% 94% 94% 100%  Weight:      Height:        Intake/Output Summary (Last 24 hours) at 12/16/2020 1113 Last data filed at 12/15/2020 1700 Gross per 24 hour  Intake 600 ml  Output 150 ml  Net 450 ml   Filed Weights   12/10/20 0500  Weight: 125.2 kg    Exam:   General: Appearance:    Severely obese female in no acute distress     Lungs:      respirations unlabored  Heart:    Tachycardic.   MS:   All extremities are intact.    Neurologic:   Awake, alert, oriented x 3  Data Reviewed:   I have personally reviewed following labs and imaging studies:  Labs: Labs show the following:   Basic Metabolic Panel: Recent Labs  Lab 12/10/20 1755 12/11/20 0212 12/12/20 0208 12/13/20 0752 12/15/20 0050  NA 142 141 135 136 138  K 3.4* 3.7 2.9* 3.5 3.4*  CL 109 106 102 105 104  CO2 27 24 26 24 25   GLUCOSE 188* 278* 227* 342* 273*  BUN 5* 5* 7 7 <5*  CREATININE 0.60 0.73 0.62 0.84 0.66  CALCIUM 9.5 9.0 9.2 9.4 8.9  MG  --   --  1.8  --   --    GFR Estimated Creatinine Clearance: 96.2 mL/min (by C-G formula based on SCr of 0.66 mg/dL). Liver Function Tests: Recent Labs  Lab 12/10/20 0300  AST 14*  ALT 25  ALKPHOS 84  BILITOT 1.0  PROT 7.2  ALBUMIN 3.8   No results for input(s): LIPASE, AMYLASE in the  last 168 hours. Recent Labs  Lab 12/10/20 0300  AMMONIA 21   Coagulation profile No results for input(s): INR, PROTIME in the last 168 hours.  CBC: Recent Labs  Lab 12/10/20 0300 12/10/20 0357 12/10/20 0822 12/11/20 0212  WBC 7.7  --  10.6* 9.3  NEUTROABS 4.4  --   --   --   HGB 14.5 13.6 13.3 14.5  HCT 41.9 40.0 39.6 42.5  MCV 88.8  --  89.4 89.9  PLT 252  --  237 193   Cardiac Enzymes: No results for input(s): CKTOTAL, CKMB, CKMBINDEX, TROPONINI in the last 168 hours. BNP (last 3 results) No results for input(s): PROBNP in the last 8760 hours. CBG: Recent Labs  Lab 12/15/20 0733 12/15/20 1127 12/15/20 1544 12/15/20 2219 12/16/20 0812  GLUCAP 184* 285* 164* 254* 201*   D-Dimer: No results for input(s): DDIMER in the last 72 hours. Hgb A1c: No results for input(s): HGBA1C in the last 72 hours.  Lipid Profile: No results for input(s): CHOL, HDL, LDLCALC, TRIG, CHOLHDL, LDLDIRECT in the last 72 hours. Thyroid function studies: No results for input(s): TSH, T4TOTAL, T3FREE, THYROIDAB in the last 72 hours.  Invalid input(s): FREET3  Anemia work up: No results for input(s): VITAMINB12, FOLATE, FERRITIN, TIBC, IRON, RETICCTPCT in the last 72 hours. Sepsis Labs: Recent Labs  Lab 12/10/20 0300 12/10/20 0822 12/11/20 0212  WBC 7.7 10.6* 9.3  LATICACIDVEN 1.4  --   --     Microbiology Recent Results (from the past 240 hour(s))  Blood culture (routine x 2)     Status: None   Collection Time: 12/10/20  3:10 AM   Specimen: BLOOD  Result Value Ref Range Status   Specimen Description BLOOD LEFT ANTECUBITAL  Final   Special Requests   Final    BOTTLES DRAWN AEROBIC AND ANAEROBIC Blood Culture adequate volume   Culture   Final    NO GROWTH 5 DAYS Performed at [facility], [address] [place], [place] [address]    Report Status 12/15/2020 FINAL  Final  Blood culture (routine x 2)     Status: None   Collection Time: 12/10/20  3:10 AM   Specimen:  BLOOD  Result Value Ref Range Status   Specimen Description BLOOD RIGHT ANTECUBITAL  Final   Special Requests   Final    BOTTLES DRAWN AEROBIC AND ANAEROBIC Blood Culture adequate volume   Culture   Final    NO GROWTH 5 DAYS Performed at [facility], [address]  [place], [place] [address]    Report Status 12/15/2020 FINAL  Final  Resp Panel by RT-PCR (Flu A&B, Covid) Nasopharyngeal Swab     Status: None   Collection Time: 12/10/20  4:34 AM   Specimen: Nasopharyngeal Swab; Nasopharyngeal(NP) swabs in vial transport medium  Result Value Ref Range Status   SARS Coronavirus 2 by RT PCR NEGATIVE NEGATIVE Final    Comment: (NOTE) SARS-CoV-2 target nucleic acids are NOT DETECTED.  The SARS-CoV-2 RNA is generally detectable in upper respiratory specimens during the acute phase of infection. The lowest concentration of SARS-CoV-2 viral copies this assay can detect is 138 copies/mL. A negative result does not preclude SARS-Cov-2 infection and should not be used as the sole basis for treatment or other patient management decisions. A negative result may occur with  improper specimen collection/handling, submission of specimen other than nasopharyngeal swab, presence of viral mutation(s) within the areas targeted by this assay, and inadequate number of viral copies(<138 copies/mL). A negative result must be combined with clinical observations, patient history, and epidemiological information. The expected result is Negative.  Fact Sheet for Patients:  BloggerCourse.com  Fact Sheet for Healthcare Providers:  SeriousBroker.it  This test is no t yet approved or cleared by the [place] FDA and  has been authorized for detection and/or diagnosis of SARS-CoV-2 by FDA under an Emergency Use Authorization (EUA). This EUA will remain  in effect (meaning this test can be used) for the duration of the COVID-19 declaration under  Section 564(b)(1) of the Act, 21 U.S.C.section 360bbb-3(b)(1), unless the authorization is terminated  or revoked sooner.       Influenza A by PCR NEGATIVE NEGATIVE Final   Influenza B by PCR NEGATIVE NEGATIVE Final    Comment: (NOTE) The Xpert Xpress SARS-CoV-2/FLU/RSV plus assay is intended as an aid in the diagnosis of influenza from Nasopharyngeal swab specimens and should not be used as a sole basis for treatment. Nasal washings and aspirates are unacceptable for Xpert Xpress SARS-CoV-2/FLU/RSV testing.  Fact Sheet for Patients: BloggerCourse.com  Fact Sheet for Healthcare Providers: SeriousBroker.it  This test is not yet approved or cleared by the [place] FDA and has been authorized for detection and/or diagnosis of SARS-CoV-2 by FDA under an Emergency Use Authorization (EUA). This EUA will remain in effect (meaning this test can be used) for the duration of the COVID-19 declaration under Section 564(b)(1) of the Act, 21 U.S.C. section 360bbb-3(b)(1), unless the authorization is terminated or revoked.  Performed at [facility], [address], [place], [place] [address]   Aerobic/Anaerobic Culture w Gram Stain (surgical/deep wound)     Status: None (Preliminary result)   Collection Time: 12/14/20  6:00 PM   Specimen: Abscess  Result Value Ref Range Status   Specimen Description ABSCESS  Final   Special Requests RIGHT LOWER LEG SPEC A  Final   Gram Stain   Final    ABUNDANT WBC PRESENT, PREDOMINANTLY PMN RARE GRAM POSITIVE RODS RARE GRAM POSITIVE COCCI IN CLUSTERS    Culture   Final    RARE STAPHYLOCOCCUS AUREUS CULTURE REINCUBATED FOR BETTER GROWTH Performed at [facility], [address] [place], [place] [address]    Report Status PENDING  Incomplete    Procedures and diagnostic studies:  No results found.  Medications:    amitriptyline  25 mg Oral QHS   artificial tears   Both Eyes  BID   busPIRone  15 mg Oral TID   dalfampridine  10 mg  Oral Q12H   dicyclomine  20 mg Oral BID   Dimethyl Fumarate  1 capsule Oral BID   docusate sodium  100 mg Oral BID   DULoxetine  60 mg Oral BID   Eluxadoline  1 tablet Oral BID   enoxaparin (LOVENOX) injection  60 mg Subcutaneous Q24H   hydrocerin   Topical BID   insulin aspart  0-20 Units Subcutaneous TID WC   insulin aspart  0-5 Units Subcutaneous QHS   insulin glargine-yfgn  50 Units Subcutaneous Daily   lamoTRIgine  200 mg Oral BID   losartan  100 mg Oral QPM   tiZANidine  2 mg Oral QID   Continuous Infusions:  cefTRIAXone (ROCEPHIN)  IV 2 g (12/15/20 1421)   And   metronidazole 500 mg (12/16/20 0826)      LOS: 5 days   Joseph Art  [facility]   How to contact the [facility] Attending or Consulting provider 7A - 7P or covering provider during after hours 7P -7A, for this patient?  Check the care team in [facility] and look for a) attending/consulting TRH provider listed and b) the [facility] team listed Log into [email or web address] and use [place]'s universal password to access. If you do not have the password, please contact the hospital operator. Locate the [facility] provider you are looking for under [facility] and page to a number that you can be directly reached. If you still have difficulty reaching the provider, please page the [facility] (Director on Call) for the Hospitalists listed on amion for assistance.  12/16/2020, 11:13 AM

## 2020-12-16 NOTE — Progress Notes (Signed)
OT Cancellation Note  Patient Details Name: [name] MRN: [id number] DOB: [birth year]   Cancelled Treatment:    Reason Eval/Treat Not Completed: Patient declined, no reason specified. Patient reports lightheadedness earlier this date while washing up. Declines getting up at this time.   Kallie Edward OTR/L Supplemental OT, Department of rehab services [phone]  Nevae Pinnix R H. 12/16/2020, 1:15 PM

## 2020-12-16 NOTE — Progress Notes (Signed)
PT Cancellation Note  Patient Details Name: [name] MRN: [id number] DOB: 05/08/1961   Cancelled Treatment:    Reason Eval/Treat Not Completed: Other (comment)  Patient refused OOB at 11:15 and 14:30 due to not feeling well (dizziness). Explained rationale for ambulation and pt continued to refuse. She agreed to continue to move her legs with AROM exercises and demonstrated 1-3 reps of ankle pumps, knee presses, and heelslides. Pt requested PT return tomorrow.    Jerolyn Center, PT Pager [phone]    Zena Amos 12/16/2020, 2:33 PM

## 2020-12-17 DIAGNOSIS — G9340 Encephalopathy, unspecified: Secondary | ICD-10-CM | POA: Diagnosis not present

## 2020-12-17 LAB — BASIC METABOLIC PANEL
Anion gap: 12 (ref 5–15)
BUN: <5 mg/dL — ABNORMAL LOW (ref 6–20)
CO2: 25 mmol/L (ref 22–32)
Calcium: 9.2 mg/dL (ref 8.9–10.3)
Chloride: 101 mmol/L (ref 98–111)
Creatinine, Ser: 0.58 mg/dL (ref 0.44–1.00)
GFR, Estimated: >60 mL/min (ref >60)
Glucose, Bld: 232 mg/dL — ABNORMAL HIGH (ref 70–99)
Potassium: 3.5 mmol/L (ref 3.5–5.1)
Sodium: 138 mmol/L (ref 135–145)

## 2020-12-17 LAB — CBC
HCT: 41.7 % (ref 36.0–46.0)
Hemoglobin: 14.3 g/dL (ref 12.0–15.0)
MCH: 30.8 pg (ref 26.0–34.0)
MCHC: 34.3 g/dL (ref 30.0–36.0)
MCV: 89.9 fL (ref 80.0–100.0)
Platelets: 254 10*3/uL (ref 150–400)
RBC: 4.64 MIL/uL (ref 3.87–5.11)
RDW: 13.1 % (ref 11.5–15.5)
WBC: 7.3 10*3/uL (ref 4.0–10.5)
nRBC: 0 % (ref 0.0–0.2)

## 2020-12-17 LAB — GLUCOSE, CAPILLARY
Glucose-Capillary: 149 mg/dL — ABNORMAL HIGH (ref 70–99)
Glucose-Capillary: 229 mg/dL — ABNORMAL HIGH (ref 70–99)
Glucose-Capillary: 186 mg/dL — ABNORMAL HIGH (ref 70–99)
Glucose-Capillary: 155 mg/dL — ABNORMAL HIGH (ref 70–99)
Glucose-Capillary: 235 mg/dL — ABNORMAL HIGH (ref 70–99)

## 2020-12-17 MED ORDER — CARVEDILOL 6.25 MG PO TABS
6.2500 mg | ORAL_TABLET | Freq: Two times a day (BID) | ORAL | Status: DC
  Administered 2020-12-17 – 2020-12-18 (×3): 6.25 mg via ORAL
  Filled 2020-12-17 (×3): qty 1

## 2020-12-17 MED ORDER — HYDRALAZINE HCL 50 MG PO TABS
50.0000 mg | ORAL_TABLET | Freq: Four times a day (QID) | ORAL | Status: DC | PRN
  Administered 2020-12-17: 50 mg via ORAL
  Filled 2020-12-17: qty 1

## 2020-12-17 MED ORDER — INSULIN ASPART 100 UNIT/ML IJ SOLN
4.0000 [IU] | Freq: Three times a day (TID) | INTRAMUSCULAR | Status: DC
  Administered 2020-12-17 – 2020-12-18 (×4): 4 [IU] via SUBCUTANEOUS

## 2020-12-17 NOTE — Progress Notes (Signed)
Occupational Therapy Treatment Patient Details Name: [name] MRN: [id number] DOB: 05-Jun-1960 Today's Date: 12/17/2020    History of present illness 60 yo female who presented with AMS and elevated blood sugars. Given x2 doses Narcan CT. Hospital admission complicated by fidning of subcutaneous abscess of the right lower extremity s/p I&D 7/30. PMH: DMII, MS, HTN, HLD and chronic pain.   OT comments  Pt making progress with functional goals, however required encouragement for OOB activity. Session focused on bed mobility to sit EOB, LB dressing to don socks, functional m,mobility with RW, transfers, grooming and UB dressing standing at RW. OT will continue to follow acutely to maximize level of function and safety  Follow Up Recommendations  Home health OT;Supervision - Intermittent    Equipment Recommendations  Tub/shower bench    Recommendations for Other Services      Precautions / Restrictions Precautions Precautions: Fall Precaution Comments: h/o falls per chart Restrictions Weight Bearing Restrictions: No       Mobility Bed Mobility Overal bed mobility: Needs Assistance Bed Mobility: Supine to Sit     Supine to sit: Modified independent (Device/Increase time);HOB elevated          Transfers Overall transfer level: Modified independent Equipment used: Rolling walker (2 wheeled) Transfers: Sit to/from Stand Sit to Stand: Modified independent (Device/Increase time)         General transfer comment: required encouragement for OOB activity    Balance Overall balance assessment: Needs assistance Sitting-balance support: Feet supported Sitting balance-Leahy Scale: Good     Standing balance support: No upper extremity supported;During functional activity Standing balance-Leahy Scale: Fair                             ADL either performed or assessed with clinical judgement   ADL Overall ADL's : Needs assistance/impaired     Grooming:  Wash/dry hands;Wash/dry face;Min guard;Standing           Upper Body Dressing : Min guard;Standing   Lower Body Dressing: Moderate assistance;Sit to/from stand Lower Body Dressing Details (indicate cue type and reason): mod A to don socks seated EOB Toilet Transfer: Supervision/safety;Modified Independent;Ambulation;RW   Toileting- Architect and Hygiene: Min guard;Sit to/from stand       Functional mobility during ADLs: Rolling walker;Supervision/safety;Modified independent       Vision Patient Visual Report: No change from baseline     Perception     Praxis      Cognition Arousal/Alertness: Awake/alert Behavior During Therapy: WFL for tasks assessed/performed Overall Cognitive Status: Within Functional Limits for tasks assessed                                          Exercises     Shoulder Instructions       General Comments      Pertinent Vitals/ Pain       Pain Assessment: Faces Faces Pain Scale: Hurts a little bit Pain Location: B feet with weight bearing/mobilty Pain Descriptors / Indicators: Sore Pain Intervention(s): Monitored during session;Repositioned  Home Living                                          Prior Functioning/Environment  Frequency  Min 2X/week        Progress Toward Goals  OT Goals(current goals can now be found in the care plan section)  Progress towards OT goals: Progressing toward goals     Plan Discharge plan remains appropriate    Co-evaluation                 AM-PAC OT "6 Clicks" Daily Activity     Outcome Measure   Help from another person eating meals?: None Help from another person taking care of personal grooming?: A Little Help from another person toileting, which includes using toliet, bedpan, or urinal?: A Little Help from another person bathing (including washing, rinsing, drying)?: A Little Help from another person to put on and  taking off regular upper body clothing?: None Help from another person to put on and taking off regular lower body clothing?: A Little 6 Click Score: 20    End of Session Equipment Utilized During Treatment: Rolling walker  OT Visit Diagnosis: Unsteadiness on feet (R26.81);Other abnormalities of gait and mobility (R26.89);Muscle weakness (generalized) (M62.81);Pain Pain - part of body: Ankle and joints of foot (B feet)   Activity Tolerance Patient tolerated treatment well   Patient Left in chair;with call bell/phone within reach;with chair alarm set   Nurse Communication          Time: 7893-8101 OT Time Calculation (min): 20 min  Charges: OT General Charges $OT Visit: 1 Visit OT Treatments $Self Care/Home Management : 8-22 mins    Galen Manila 12/17/2020, 2:11 PM

## 2020-12-17 NOTE — Progress Notes (Signed)
Progress Note    [name]  HRC:[id number] DOB: 07-Dec-1960  DOA: 12/10/2020 PCP: Felix Pacini, FNP    Brief Narrative:     Medical records reviewed and are as summarized below:  [name] is an 60 y.o. female with history of multiple sclerosis, diabetes mellitus, hypertension, hyperlipidemia and chronic pain was brought to the ER after patient's family felt that patient was getting increasingly confused over the last 24 hours.  Found to have new onset DM.  Worsening area of infection on 7/30 so ortho called for I/D.  Cultures pending for de-escalation of abx.     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   -resolved  Hyperosmolar nonketotic hyperglycemia/new onset DM -patient's home medication list does not show any antidiabetic medication -SSI and lantus -appreciate diabetic coordinator's help  -- will do SSI at home as her PO intake is variable per patient -asked nurse to educate patient on giving shots/checking blood sugar  Right leg wound with ulceration  -on AM of 7/30 patient developed an increased abscess to the left of the ulcer-- ortho consulted for I/D- done with culture pending on 7/30 PM -IV abx while culture pending -missed outpatient appointment with Dr. Nelda Severe ([facility]) so will need new one upon d/c  Hypertension  -resume home meds  Hypokalemia -replete  History of multiple sclerosis  -resume home meds  Hyperlipidemia -statin   obesity Body mass index is 48.89 kg/m.   Family Communication/Anticipated D/C date and plan/Code Status   DVT prophylaxis:  Lovenox ordered. Code Status: Full Code.  Disposition Plan: Status is: Inpatient  Remains inpatient appropriate because:Inpatient level of care appropriate due to severity of illness  Dispo: The patient is from: Home              Anticipated d/c is to: Home              Patient currently is not medically stable to d/c. Await culture   Difficult to place patient No         Medical Consultants:   ortho   Subjective:   Asking for dietician to re-visit to help educate her regarding diet   Objective:    Vitals:   12/16/20 0502 12/16/20 1559 12/16/20 2049 12/17/20 0700  BP: (!) 175/114 (!) 180/107 (!) 171/86   Pulse: 100 100 (!) 104   Resp: 20 20 (!) 21 20  Temp: 98 F (36.7 C) 98 F (36.7 C) 98 F (36.7 C)   TempSrc: Oral Oral Oral   SpO2: 100% 95% 91% 92%  Weight:      Height:        Intake/Output Summary (Last 24 hours) at 12/17/2020 1130 Last data filed at 12/16/2020 2121 Gross per 24 hour  Intake --  Output 400 ml  Net -400 ml   Filed Weights   12/10/20 0500  Weight: 125.2 kg    Exam:  General: Appearance:    Severely obese female in no acute distress     Lungs:     respirations unlabored  Heart:    Tachycardic.   MS:   All extremities are  intact.  Wound on right leg- -just repacked by nursing   Neurologic:   Awake, alert, oriented x 3. No apparent focal neurological           defect.              Data Reviewed:   I have personally reviewed following labs and imaging studies:  Labs: Labs show the following:   Basic Metabolic Panel: Recent Labs  Lab 12/11/20 0212 12/12/20 0208 12/13/20 0752 12/15/20 0050 12/17/20 0458  NA 141 135 136 138 138  K 3.7 2.9* 3.5 3.4* 3.5  CL 106 102 105 104 101  CO2 24 26 24 25 25   GLUCOSE 278* 227* 342* 273* 232*  BUN 5* 7 7 <5* <5*  CREATININE 0.73 0.62 0.84 0.66 0.58  CALCIUM 9.0 9.2 9.4 8.9 9.2  MG  --  1.8  --   --   --    GFR Estimated Creatinine Clearance: 96.2 mL/min (by C-G formula based  on SCr of 0.58 mg/dL). Liver Function Tests: No results for input(s): AST, ALT, ALKPHOS, BILITOT, PROT, ALBUMIN in the last 168 hours.  No results for input(s): LIPASE, AMYLASE in the last 168 hours. No results for input(s): AMMONIA in the last 168 hours.  Coagulation profile No results for input(s): INR, PROTIME in the last 168 hours.  CBC: Recent Labs  Lab 12/11/20 0212 12/17/20 0458  WBC 9.3 7.3  HGB 14.5 14.3  HCT 42.5 41.7  MCV 89.9 89.9  PLT 193 254   Cardiac Enzymes: No results for input(s): CKTOTAL, CKMB, CKMBINDEX, TROPONINI in the last 168 hours. BNP (last 3 results) No results for input(s): PROBNP in the last 8760 hours. CBG: Recent Labs  Lab 12/16/20 1144 12/16/20 1557 12/16/20 2109 12/17/20 0349 12/17/20 0808  GLUCAP 245* 261* 200* 149* 229*   D-Dimer: No results for input(s): DDIMER in the last 72 hours. Hgb A1c: No results for input(s): HGBA1C in the last 72 hours.  Lipid Profile: No results for input(s): CHOL, HDL, LDLCALC, TRIG, CHOLHDL, LDLDIRECT in the last 72 hours. Thyroid function studies: No results for input(s): TSH, T4TOTAL, T3FREE, THYROIDAB in the last 72 hours.  Invalid input(s): FREET3  Anemia work up: No results for input(s): VITAMINB12, FOLATE, FERRITIN, TIBC, IRON, RETICCTPCT in the last 72 hours. Sepsis Labs: Recent Labs  Lab 12/11/20 0212 12/17/20 0458  WBC 9.3 7.3    Microbiology Recent Results (from the past 240 hour(s))  Blood culture (routine x 2)     Status: None   Collection Time: 12/10/20  3:10 AM   Specimen: BLOOD  Result Value Ref Range Status   Specimen Description BLOOD LEFT ANTECUBITAL  Final   Special Requests   Final    BOTTLES DRAWN AEROBIC AND ANAEROBIC Blood Culture adequate volume   Culture   Final    NO GROWTH 5 DAYS Performed at [facility], [address], [place], [place] [address]    Report Status 12/15/2020 FINAL  Final  Blood culture (routine x 2)     Status: None   Collection  Time: 12/10/20  3:10 AM   Specimen: BLOOD  Result Value Ref Range Status   Specimen Description BLOOD RIGHT ANTECUBITAL  Final   Special Requests   Final    BOTTLES DRAWN AEROBIC AND ANAEROBIC Blood Culture adequate volume   Culture   Final    NO GROWTH 5 DAYS Performed at [facility], [address], [place], [place] [address]  Report Status 12/15/2020 FINAL  Final  Resp Panel by RT-PCR (Flu A&B, Covid) Nasopharyngeal Swab     Status: None   Collection Time: 12/10/20  4:34 AM   Specimen: Nasopharyngeal Swab; Nasopharyngeal(NP) swabs in vial transport medium  Result Value Ref Range Status   SARS Coronavirus 2 by RT PCR NEGATIVE NEGATIVE Final    Comment: (NOTE) SARS-CoV-2 target nucleic acids are NOT DETECTED.  The SARS-CoV-2 RNA is generally detectable in upper respiratory specimens during the acute phase of infection. The lowest concentration of SARS-CoV-2 viral copies this assay can detect is 138 copies/mL. A negative result does not preclude SARS-Cov-2 infection and should not be used as the sole basis for treatment or other patient management decisions. A negative result may occur with  improper specimen collection/handling, submission of specimen other than nasopharyngeal swab, presence of viral mutation(s) within the areas targeted by this assay, and inadequate number of viral copies(<138 copies/mL). A negative result must be combined with clinical observations, patient history, and epidemiological information. The expected result is Negative.  Fact Sheet for Patients:  BloggerCourse.com  Fact Sheet for Healthcare Providers:  SeriousBroker.it  This test is no t yet approved or cleared by the [place] FDA and  has been authorized for detection and/or diagnosis of SARS-CoV-2 by FDA under an Emergency Use Authorization (EUA). This EUA will remain  in effect (meaning this test can be used) for the duration of  the COVID-19 declaration under Section 564(b)(1) of the Act, 21 U.S.C.section 360bbb-3(b)(1), unless the authorization is terminated  or revoked sooner.       Influenza A by PCR NEGATIVE NEGATIVE Final   Influenza B by PCR NEGATIVE NEGATIVE Final    Comment: (NOTE) The Xpert Xpress SARS-CoV-2/FLU/RSV plus assay is intended as an aid in the diagnosis of influenza from Nasopharyngeal swab specimens and should not be used as a sole basis for treatment. Nasal washings and aspirates are unacceptable for Xpert Xpress SARS-CoV-2/FLU/RSV testing.  Fact Sheet for Patients: BloggerCourse.com  Fact Sheet for Healthcare Providers: SeriousBroker.it  This test is not yet approved or cleared by the [place] FDA and has been authorized for detection and/or diagnosis of SARS-CoV-2 by FDA under an Emergency Use Authorization (EUA). This EUA will remain in effect (meaning this test can be used) for the duration of the COVID-19 declaration under Section 564(b)(1) of the Act, 21 U.S.C. section 360bbb-3(b)(1), unless the authorization is terminated or revoked.  Performed at [facility], [address] [place], [place] [address]   Aerobic/Anaerobic Culture w Gram Stain (surgical/deep wound)     Status: None (Preliminary result)   Collection Time: 12/14/20  6:00 PM   Specimen: Abscess  Result Value Ref Range Status   Specimen Description ABSCESS  Final   Special Requests RIGHT LOWER LEG SPEC A  Final   Gram Stain   Final    ABUNDANT WBC PRESENT, PREDOMINANTLY PMN RARE GRAM POSITIVE RODS RARE GRAM POSITIVE COCCI IN CLUSTERS Performed at [facility], [address], [place], [place] [address]    Culture   Final    RARE STAPHYLOCOCCUS AUREUS SUSCEPTIBILITIES TO FOLLOW NO ANAEROBES ISOLATED; CULTURE IN PROGRESS FOR 5 DAYS    Report Status PENDING  Incomplete    Procedures and diagnostic studies:  No results  found.  Medications:    amitriptyline  25 mg Oral QHS   artificial tears   Both Eyes BID   busPIRone  15 mg Oral TID   dalfampridine  10  mg Oral Q12H   dicyclomine  20 mg Oral BID   Dimethyl Fumarate  1 capsule Oral BID   docusate sodium  100 mg Oral BID   DULoxetine  60 mg Oral BID   Eluxadoline  1 tablet Oral BID   enoxaparin (LOVENOX) injection  60 mg Subcutaneous Q24H   hydrocerin   Topical BID   insulin aspart  0-20 Units Subcutaneous TID WC   insulin aspart  0-5 Units Subcutaneous QHS   insulin aspart  4 Units Subcutaneous TID WC   insulin glargine-yfgn  50 Units Subcutaneous Daily   lamoTRIgine  200 mg Oral BID   losartan  100 mg Oral QPM   tiZANidine  2 mg Oral QID   Continuous Infusions:  cefTRIAXone (ROCEPHIN)  IV 2 g (12/17/20 0912)   And   metronidazole 500 mg (12/17/20 0752)      LOS: 6 days   Joseph Art  [facility]   How to contact the [facility] Attending or Consulting provider 7A - 7P or covering provider during after hours 7P -7A, for this patient?  Check the care team in [facility] and look for a) attending/consulting TR[facility] provider listed and b) the [facility] team listed Log into [email or web address] and use [facility]'s universal password to access. If you do not have the password, please contact the hospital operator. Locate the [facility] provider you are looking for under [facility] and page to a number that you can be directly reached. If you still have difficulty reaching the provider, please page the [facility] (Director on Call) for the Hospitalists listed on amion for assistance.  12/17/2020, 11:30 AM

## 2020-12-17 NOTE — Anesthesia Postprocedure Evaluation (Signed)
Anesthesia Post Note  Patient: [name]  Procedure(s) Performed: IRRIGATION AND DEBRIDEMENT RIGHT LOWER EXTREMITY (Right: Leg Lower)     Patient location during evaluation: PACU Anesthesia Type: General Level of consciousness: awake and alert Pain management: pain level controlled Vital Signs Assessment: post-procedure vital signs reviewed and stable Respiratory status: spontaneous breathing, nonlabored ventilation, respiratory function stable and patient connected to nasal cannula oxygen Cardiovascular status: blood pressure returned to baseline and stable Postop Assessment: no apparent nausea or vomiting Anesthetic complications: no   No notable events documented.  Last Vitals:  Vitals:   12/16/20 2049 12/17/20 0700  BP: (!) 171/86   Pulse: (!) 104   Resp: (!) 21 20  Temp: 36.7 C   SpO2: 91% 92%    Last Pain:  Vitals:   12/17/20 0820  TempSrc:   PainSc: Asleep                 Clarie Camey

## 2020-12-17 NOTE — Progress Notes (Signed)
Physical Therapy Treatment Patient Details Name: [name] MRN: [id number] DOB: [birth year] Today's Date: 12/17/2020    History of Present Illness 60 yo female who presented with AMS and elevated blood sugars. Given x2 doses Narcan CT. Hospital admission complicated by fidning of subcutaneous abscess of the right lower extremity s/p I&D 7/30. PMH: DMII, MS, HTN, HLD and chronic pain.    PT Comments    Patient reporting feeling some better today. Agreed to ambulation but also concerned that she will over-exercise and cause increased weakness due to her MS flare-up. Ambulated 100 ft with RW and minguard assist without incident.     Follow Up Recommendations  Home health PT;Supervision for mobility/OOB (HHPT safety eval due to h/o falls)     Equipment Recommendations  None recommended by PT (pt reports she has a rollator)    Recommendations for Other Services       Precautions / Restrictions Precautions Precautions: Fall Precaution Comments: h/o falls per chart Restrictions Weight Bearing Restrictions: No    Mobility  Bed Mobility Overal bed mobility: Needs Assistance Bed Mobility: Supine to Sit     Supine to sit: Modified independent (Device/Increase time);HOB elevated     General bed mobility comments: up in bathroom on arrival    Transfers Overall transfer level: Modified independent Equipment used: Rolling walker (2 wheeled) Transfers: Sit to/from Stand Sit to Stand: Modified independent (Device/Increase time)         General transfer comment: required encouragement for OOB activity  Ambulation/Gait Ambulation/Gait assistance: Min guard Gait Distance (Feet): 100 Feet Assistive device: Rolling walker (2 wheeled);1 person hand held assist Gait Pattern/deviations: Step-through pattern;Shuffle Gait velocity: controlled on RW   General Gait Details: no cues needed with RW; good proximity for body habitus; minguard for safety due to pt's pain level in legs and  reported fatigue   Stairs             Wheelchair Mobility    Modified Rankin (Stroke Patients Only)       Balance Overall balance assessment: Needs assistance Sitting-balance support: Feet supported Sitting balance-Leahy Scale: Good     Standing balance support: No upper extremity supported;During functional activity Standing balance-Leahy Scale: Good Standing balance comment: able to perform pericare in standing after toileting                            Cognition Arousal/Alertness: Awake/alert Behavior During Therapy: WFL for tasks assessed/performed Overall Cognitive Status: Within Functional Limits for tasks assessed                                        Exercises Other Exercises Other Exercises: deferred due to pt feeling she will overdo it if exercises after ambulating; describes being in an MS flare and does not want to overdo    General Comments        Pertinent Vitals/Pain Pain Assessment: 0-10 Pain Score: 10-Worst pain ever Faces Pain Scale: Hurts a little bit Pain Location: B feet, legs and low back Pain Descriptors / Indicators: Sore Pain Intervention(s): Limited activity within patient's tolerance    Home Living                      Prior Function            PT Goals (current goals can now be found  in the care plan section) Acute Rehab PT Goals Patient Stated Goal: To return home tomorrow Progress towards PT goals: Progressing toward goals    Frequency    Min 3X/week      PT Plan Current plan remains appropriate    Co-evaluation              AM-PAC PT "6 Clicks" Mobility   Outcome Measure  Help needed turning from your back to your side while in a flat bed without using bedrails?: None Help needed moving from lying on your back to sitting on the side of a flat bed without using bedrails?: None Help needed moving to and from a bed to a chair (including a wheelchair)?: None Help  needed standing up from a chair using your arms (e.g., wheelchair or bedside chair)?: None Help needed to walk in hospital room?: A Little Help needed climbing 3-5 steps with a railing? : A Lot 6 Click Score: 21    End of Session Equipment Utilized During Treatment: Gait belt Activity Tolerance: Patient limited by fatigue;Patient limited by pain Patient left: with call bell/phone within reach;in bed Nurse Communication: Other (comment) (NT-pt wants purewick; needs new O2 probe; RN-RLE bandage coming loose) PT Visit Diagnosis: Muscle weakness (generalized) (M62.81);Pain Pain - Right/Left: Right Pain - part of body: Leg     Time: 1425-1441 PT Time Calculation (min) (ACUTE ONLY): 16 min  Charges:  $Gait Training: 8-22 mins                      Jerolyn Center, PT Pager [phone]    Zena Amos 12/17/2020, 4:06 PM

## 2020-12-18 ENCOUNTER — Other Ambulatory Visit (HOSPITAL_COMMUNITY): Payer: Self-pay

## 2020-12-18 DIAGNOSIS — G9341 Metabolic encephalopathy: Secondary | ICD-10-CM

## 2020-12-18 LAB — GLUCOSE, CAPILLARY
Glucose-Capillary: 172 mg/dL — ABNORMAL HIGH (ref 70–99)
Glucose-Capillary: 200 mg/dL — ABNORMAL HIGH (ref 70–99)
Glucose-Capillary: 149 mg/dL — ABNORMAL HIGH (ref 70–99)

## 2020-12-18 MED ORDER — ACCU-CHEK SOFTCLIX LANCETS MISC
5 refills | Status: DC
  Filled 2020-12-18: qty 100, 25d supply, fill #0

## 2020-12-18 MED ORDER — ACCU-CHEK GUIDE VI STRP
ORAL_STRIP | 12 refills | Status: DC
  Filled 2020-12-18: qty 100, 25d supply, fill #0

## 2020-12-18 MED ORDER — INSULIN PEN NEEDLE 32G X 4 MM MISC
2 refills | Status: DC
  Filled 2020-12-18: qty 200, 50d supply, fill #0

## 2020-12-18 MED ORDER — BLOOD GLUCOSE MONITOR SYSTEM W/DEVICE KIT
PACK | 0 refills | Status: DC
  Filled 2020-12-18: qty 1, 1d supply, fill #0

## 2020-12-18 MED ORDER — ORITAVANCIN DIPHOSPHATE 400 MG IV SOLR
1200.0000 mg | Freq: Once | INTRAVENOUS | Status: AC
  Administered 2020-12-18: 1200 mg via INTRAVENOUS
  Filled 2020-12-18: qty 120

## 2020-12-18 MED ORDER — AMLODIPINE BESYLATE 5 MG PO TABS
5.0000 mg | ORAL_TABLET | Freq: Every day | ORAL | 2 refills | Status: DC
  Filled 2020-12-18: qty 30, 30d supply, fill #0

## 2020-12-18 MED ORDER — INSULIN GLARGINE 100 UNIT/ML SOLOSTAR PEN
40.0000 [IU] | PEN_INJECTOR | Freq: Every day | SUBCUTANEOUS | 2 refills | Status: DC
  Filled 2020-12-18: qty 15, 37d supply, fill #0

## 2020-12-18 MED ORDER — INSULIN LISPRO (1 UNIT DIAL) 100 UNIT/ML (KWIKPEN)
10.0000 [IU] | PEN_INJECTOR | Freq: Three times a day (TID) | SUBCUTANEOUS | 2 refills | Status: DC
  Filled 2020-12-18: qty 9, 30d supply, fill #0

## 2020-12-18 NOTE — Progress Notes (Signed)
Physical Therapy Treatment Patient Details Name: [name] MRN: [id number] DOB: Jan 02, 1961 Today's Date: 12/18/2020    History of Present Illness 60 yo female who presented with AMS and elevated blood sugars. Given x2 doses Narcan CT. Hospital admission complicated by fidning of subcutaneous abscess of the right lower extremity s/p I&D 7/30. PMH: DMII, MS, HTN, HLD and chronic pain.    PT Comments    Patient completing bath on arrival, yet felt strong enough to do stair training and then even wanted to ambulate. One minor imbalance as pt descended 1st step with min assist to recover (also using single rail on rt). Descended step the 2nd time without imbalance with rail and simulated cane. Gait improved overall, including distance tolerated.     Follow Up Recommendations  Home health PT;Supervision for mobility/OOB (HHPT safety eval due to h/o falls)     Equipment Recommendations  None recommended by PT (pt reports she has a rollator)    Recommendations for Other Services       Precautions / Restrictions Precautions Precautions: Fall Precaution Comments: h/o falls per chart    Mobility  Bed Mobility               General bed mobility comments: up in chair    Transfers Overall transfer level: Modified independent Equipment used: Rolling walker (2 wheeled) Transfers: Sit to/from Stand Sit to Stand: Modified independent (Device/Increase time)            Ambulation/Gait Ambulation/Gait assistance: Supervision Gait Distance (Feet): 160 Feet Assistive device: Rolling walker (2 wheeled);1 person hand held assist Gait Pattern/deviations: Step-through pattern Gait velocity: controlled on RW   General Gait Details: no cues needed with RW; good proximity for body habitus; supervision for safety due to h/o falls   Stairs Stairs: Yes Stairs assistance: Min assist Stair Management: One rail Right;One rail Left;Forwards;Step to pattern Number of Stairs: 2 General  stair comments: on initial descent, posterior imbalance wiht min assist to recover; 2nd descent used rail and +1HHA without difficulty   Wheelchair Mobility    Modified Rankin (Stroke Patients Only)       Balance Overall balance assessment: Needs assistance Sitting-balance support: Feet supported Sitting balance-Leahy Scale: Good     Standing balance support: No upper extremity supported;During functional activity Standing balance-Leahy Scale: Good Standing balance comment: able to perform pericare in standing after toileting                            Cognition Arousal/Alertness: Awake/alert Behavior During Therapy: WFL for tasks assessed/performed Overall Cognitive Status: Within Functional Limits for tasks assessed                                        Exercises      General Comments General comments (skin integrity, edema, etc.): pt finishing bath on arrival      Pertinent Vitals/Pain Pain Assessment: 0-10 Pain Score: 9  Pain Location: B feet, legs and low back Pain Descriptors / Indicators: Sore Pain Intervention(s): Limited activity within patient's tolerance    Home Living                      Prior Function            PT Goals (current goals can now be found in the care plan section) Acute Rehab  PT Goals Patient Stated Goal: To return home tomorrow Progress towards PT goals: Progressing toward goals    Frequency    Min 3X/week      PT Plan Current plan remains appropriate    Co-evaluation              AM-PAC PT "6 Clicks" Mobility   Outcome Measure  Help needed turning from your back to your side while in a flat bed without using bedrails?: None Help needed moving from lying on your back to sitting on the side of a flat bed without using bedrails?: None Help needed moving to and from a bed to a chair (including a wheelchair)?: None Help needed standing up from a chair using your arms (e.g.,  wheelchair or bedside chair)?: None Help needed to walk in hospital room?: A Little Help needed climbing 3-5 steps with a railing? : A Little 6 Click Score: 22    End of Session Equipment Utilized During Treatment: Gait belt Activity Tolerance: Patient limited by pain;Patient tolerated treatment well Patient left: with call bell/phone within reach;in chair;with nursing/sitter in room Nurse Communication: Other (comment) (NT-pt wants purewick;) PT Visit Diagnosis: Muscle weakness (generalized) (M62.81);Pain Pain - Right/Left: Right Pain - part of body: Leg     Time: 1132-1201 PT Time Calculation (min) (ACUTE ONLY): 29 min  Charges:  $Gait Training: 8-22 mins                      Jerolyn Center, PT Pager [phone]    Zena Amos 12/18/2020, 12:10 PM

## 2020-12-18 NOTE — Discharge Summary (Signed)
Physician Discharge Summary  [name] TMB:[id number] DOB: Jan 06, 1961 DOA: 12/10/2020  PCP: Beverley Fiedler, FNP  Admit date: 12/10/2020 Discharge date: 12/18/2020  Admitted From: Home Disposition: Home  Recommendations for Outpatient Follow-up:  Follow up with PCP in 1 week Please obtain BMP/CBC in one week Please follow up on the following pending results: None  Home Health: PT/OT Equipment/Devices: None  Discharge Condition: Stable CODE STATUS: Full code Diet recommendation: Regular diet   Brief/Interim Summary:  Admission HPI written by Gean Birchwood, MD   HPI: [name] is a 60 y.o. female with history of multiple sclerosis, diabetes mellitus, hypertension, hyperlipidemia and chronic pain was brought to the ER after patient's family felt that patient was getting increasingly confused over the last 24 hours.  I am unable to reach patient's family at this time.  Reviewing patient's chart patient was recently on steroids for multiple sclerosis exacerbation as per the notes of patient's neurologist on November 13, 2020.    Hospital course:  Acute toxic/metabolic encephalopathy Likely multifactorial from Nucynta/oxycodone in addition to hyperglycemia on admission. Improved with medication adjustment and treatment of hyperglycemic crisis.  Hyperosmolar hyperglycemic syndrome New diagnosis of diabetes. Hemoglobin A1C of 10.6%. Patient treated with IV insulin via drip with rapid improvement and transitioned to Lantus and sliding scale. Diabetes coordinator consulted for education. Patient discharged on Lantus and Novolog meal coverage.  Right leg wound with ulceration Right leg abscess Empiric antibiotics with Vancomycin/Cefepime, transitioned to Augmentin, and then to Ceftriaxone and Flagyl. Abscess seen on CT tib/fib. Orthopedic surgery consulted and performed I&D on 7/30. Wound culture significant for MRSA. Patient given oritavancin 1200 mg IV x1 after  discussing case with infectious disease. No outpatient antibiotics warranted.   Discharge Diagnoses:  Principal Problem:   Acute metabolic encephalopathy Active Problems:   Obesity, Class III, BMI 40-49.9 (morbid obesity) ([place])   Multiple sclerosis (HCC)   Essential hypertension   Hyperosmolar non-ketotic state due to type 2 diabetes mellitus ([place])   Leg wound, right, initial encounter   Hyperglycemia    Discharge Instructions  Discharge Instructions     Amb Referral to Nutrition and Diabetic Education   Complete by: As directed       Allergies as of 12/18/2020   No Known Allergies      Medication List     STOP taking these medications    Airborne Chew   doxycycline 100 MG tablet Commonly known as: VIBRA-TABS   Nucynta ER 200 MG Tb12 Generic drug: Tapentadol HCl   omeprazole 20 MG capsule Commonly known as: PRILOSEC       TAKE these medications    Accu-Chek Guide test strip Generic drug: glucose blood Use as instructed up to 4 times daily   Accu-Chek Guide w/Device Kit use as directed   Accu-Chek Softclix Lancets lancets Use as directed up to 4 times daily   amitriptyline 25 MG tablet Commonly known as: ELAVIL Take 1 tablet (25 mg total) by mouth at bedtime.   amLODipine 5 MG tablet Commonly known as: NORVASC Take 1 tablet (5 mg total) by mouth daily.   Anti-Diarrheal 2 MG tablet Generic drug: loperamide Take 2 mg by mouth as needed for diarrhea or loose stools.   atorvastatin 80 MG tablet Commonly known as: LIPITOR Take 1 tablet (80 mg total) by mouth daily. What changed: when to take this   baclofen 20 MG tablet Commonly known as: LIORESAL Take 20 mg by mouth 2 (two) times daily.  BD Pen Needle Nano U/F 32G X 4 MM Misc Generic drug: Insulin Pen Needle use as directed 4 times daily   busPIRone 15 MG tablet Commonly known as: BUSPAR Take 15 mg by mouth 3 (three) times daily.   dalfampridine 10 MG Tb12 One po q12 hours What  changed:  how much to take how to take this when to take this additional instructions   dicyclomine 20 MG tablet Commonly known as: BENTYL Take 20 mg by mouth in the morning and at bedtime.   diphenhydramine-acetaminophen 25-500 MG Tabs tablet Commonly known as: TYLENOL PM Take 1 tablet by mouth at bedtime as needed (pain/sleep).   diphenoxylate-atropine 2.5-0.025 MG tablet Commonly known as: LOMOTIL Take 1 tablet by mouth 2 (two) times daily. PATIENT NEEDS OFFICE VISIT FOR ADDITIONAL REFILLS What changed:  when to take this additional instructions   DULoxetine 60 MG capsule Commonly known as: CYMBALTA Take 60 mg by mouth 2 (two) times daily.   Echinacea 400 MG Caps Take 400 mg by mouth in the morning and at bedtime.   fluticasone 50 MCG/ACT nasal spray Commonly known as: FLONASE Place 2 sprays into the nose daily. What changed:  how much to take when to take this   HAIR/SKIN/NAILS PO Take 1 tablet by mouth daily.   HumaLOG KwikPen 100 UNIT/ML KwikPen Generic drug: insulin lispro Inject 10 Units into the skin 3 (three) times daily with meals.   ibuprofen 800 MG tablet Commonly known as: ADVIL Take 800 mg by mouth 3 (three) times daily as needed for moderate pain.   lamoTRIgine 200 MG tablet Commonly known as: LAMICTAL One po twicw daily What changed:  how much to take how to take this when to take this additional instructions   Lantus SoloStar 100 UNIT/ML Solostar Pen Generic drug: insulin glargine Inject 40 Units into the skin daily.   LEG CRAMPS PO Take 1-2 tablets by mouth daily as needed (For leg cramps).   losartan 100 MG tablet Commonly known as: COZAAR Take 1 tablet (100 mg total) by mouth daily. What changed: when to take this   MAGnesium-Oxide 400 (241.3 Mg) MG tablet Generic drug: magnesium oxide Take 1 tablet by mouth daily.   Melatonin Maximum Strength 5 MG Tabs Generic drug: melatonin Take 15 mg by mouth at bedtime.   montelukast  10 MG tablet Commonly known as: SINGULAIR Take 10 mg by mouth at bedtime.   Mucus Relief 400 MG Tabs tablet Generic drug: guaifenesin Take 400 mg by mouth every 6 (six) hours as needed (For mucus).   Narcan 4 MG/0.1ML Liqd nasal spray kit Generic drug: naloxone Place 1 spray into the nose as directed. overdose   One-A-Day Womens Formula Tabs Take 1 tablet by mouth daily.   oxyCODONE 15 MG immediate release tablet Commonly known as: ROXICODONE Take 15 mg by mouth every 6 (six) hours as needed for pain.   pantoprazole 40 MG tablet Commonly known as: PROTONIX Take 40 mg by mouth 2 (two) times daily.   Tecfidera 240 MG Cpdr Generic drug: Dimethyl Fumarate Take 1 capsule by mouth 2 (two) times daily.   tiZANidine 2 MG tablet Commonly known as: ZANAFLEX Take 2 mg by mouth 4 (four) times daily. Patient takes up to four times daily   Viberzi 100 MG Tabs Generic drug: Eluxadoline Take 1 tablet by mouth 2 (two) times daily.   vitamin B-12 1000 MCG tablet Commonly known as: CYANOCOBALAMIN Take 1,000 mcg by mouth daily.   VITAMIN D PO Take  1 tablet by mouth daily.   Xiidra 5 % Soln Generic drug: Lifitegrast Place 1 drop into both eyes in the morning and at bedtime.   zolpidem 10 MG tablet Commonly known as: AMBIEN Take 10 mg by mouth at bedtime.        Follow-up Information     [facility] Follow up.   Specialty: Home Health Services Why: You have been arranged with HHPT. They will contact you to schedule. If you have any questions call [phone] Contact information: [place] [address] [phone]                No Known Allergies  Consultations: Orthopedic surgery   Procedures/Studies: DG Tibia/Fibula Right  Result Date: 12/10/2020 CLINICAL DATA:  RLE wound EXAM: RIGHT TIBIA AND FIBULA - 2 VIEW COMPARISON:  01/18/2010. FINDINGS: Soft tissue swelling. No acute or focal bony abnormality identified. No evidence of  fracture dislocation. Degenerative changes noted about the right knee and ankle. IMPRESSION: Soft tissue swelling. No radiopaque foreign body. No acute bony abnormality. Electronically Signed   By: Marcello Moores  Register   On: 12/10/2020 04:33   CT Head Wo Contrast  Result Date: 12/10/2020 CLINICAL DATA:  60 year old female status post fall. Some confusion. History of multiple sclerosis. EXAM: CT HEAD WITHOUT CONTRAST TECHNIQUE: Contiguous axial images were obtained from the base of the skull through the vertex without intravenous contrast. COMPARISON:  Brain MRI 11/18/2019.  Head CT 02/25/2012. FINDINGS: Brain: Cerebral volume is within normal limits for age. No midline shift, ventriculomegaly, mass effect, evidence of mass lesion, intracranial hemorrhage or evidence of cortically based acute infarction. Gray-white matter differentiation is within normal limits for age. No cortical encephalomalacia identified. Vascular: Mild Calcified atherosclerosis at the skull base. No suspicious intracranial vascular hyperdensity. Skull: Stable and intact. Sinuses/Orbits: Visualized paranasal sinuses and mastoids are stable and well aerated. Other: No orbit or scalp soft tissue injury identified. IMPRESSION: Negative for age non contrast CT appearance of the brain. No acute traumatic injury identified. Electronically Signed   By: Genevie Ann M.D.   On: 12/10/2020 05:29   CT Cervical Spine Wo Contrast  Result Date: 12/10/2020 CLINICAL DATA:  60 year old female status post fall. Some confusion. History of multiple sclerosis. EXAM: CT CERVICAL SPINE WITHOUT CONTRAST TECHNIQUE: Multidetector CT imaging of the cervical spine was performed without intravenous contrast. Multiplanar CT image reconstructions were also generated. COMPARISON:  Head CT today reported separately. FINDINGS: Alignment: Straightening of cervical lordosis. Cervicothoracic junction alignment is within normal limits. Bilateral posterior element alignment is within  normal limits. Skull base and vertebrae: Visualized skull base is intact. No atlanto-occipital dissociation. C1 and C2 appear intact and aligned. No acute osseous abnormality identified. Soft tissues and spinal canal: No prevertebral fluid or swelling. No visible canal hematoma. Partially retropharyngeal course of both carotids with some calcified atherosclerosis. Otherwise negative visible noncontrast neck soft tissues. Disc levels: Advanced lower cervical disc and endplate degeneration with mild cervical spinal stenosis suspected at both C5-C6 and C6-C7. Possible developing Ossification of the posterior longitudinal ligament (OPLL). At those levels. Upper chest: Visible upper thoracic levels appear grossly intact. Negative lung apices. IMPRESSION: 1. No acute traumatic injury identified in the cervical spine. 2. 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 Electronically Signed   By: Genevie Ann M.D.   On: 12/10/2020 05:32   CT TIBIA FIBULA RIGHT  WO CONTRAST  Result Date: 12/10/2020 CLINICAL DATA:  Osteomyelitis suspected, tib/fib, xray done EXAM: CT OF THE LOWER RIGHT EXTREMITY WITHOUT CONTRAST TECHNIQUE: Multidetector CT imaging of the right lower extremity was performed according to the standard protocol. COMPARISON:  Same-day radiograph FINDINGS: Bones/Joint/Cartilage Fracture there is no acute fracture. There is no evidence of bone erosion or destruction. There is tricompartment osteoarthritis of the right knee, with moderate medial and severe patellofemoral joint space narrowing. There is a small right knee joint effusion. Plantar calcaneal spurring. 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 Ligaments Suboptimally assessed by CT. Muscles and Tendons There is no muscle atrophy. There is no evidence of intramuscular fluid  collection on noncontrast CT. Insertional Achilles tendinosis with intra tendinous calcification. Soft tissues Skin thickening and subcutaneous soft tissue swelling diffusely along the lower extremity. There is a soft tissue defect medially at the level of the mid tibia with adjacent swelling and soft tissue gas but no well-defined collection. IMPRESSION: Skin thickening and subcutaneous edema diffusely along the right lower extremity as can be seen in cellulitis. Soft tissue defect medially at the level of the mid tibia with adjacent soft tissue gas and swelling but no well-defined fluid collection or findings to suggest underlying osteomyelitis on noncontrast CT. No evidence of deep intramuscular compartment involvement on noncontrast CT. Electronically Signed   By: Maurine Simmering   On: 12/10/2020 07:26   US Venous Img Lower Bilateral (DVT)  Result Date: 11/20/2020 CLINICAL DATA:  60 year old female with a history of bilateral leg swelling EXAM: BILATERAL LOWER EXTREMITY VENOUS DOPPLER ULTRASOUND TECHNIQUE: Gray-scale sonography with graded compression, as well as color Doppler and duplex ultrasound were performed to evaluate the lower extremity deep venous systems from the level of the common femoral vein and including the common femoral, femoral, profunda femoral, popliteal and calf veins including the posterior tibial, peroneal and gastrocnemius veins when visible. The superficial great saphenous vein was also interrogated. Spectral Doppler was utilized to evaluate flow at rest and with distal augmentation maneuvers in the common femoral, femoral and popliteal veins. COMPARISON:  None. FINDINGS: RIGHT LOWER EXTREMITY Common Femoral Vein: No evidence of thrombus. Normal compressibility, respiratory phasicity and response to augmentation. Saphenofemoral Junction: No evidence of thrombus. Normal compressibility and flow on color Doppler imaging. Profunda Femoral Vein: No evidence of thrombus. Normal  compressibility and flow on color Doppler imaging. Femoral Vein: No evidence of thrombus. Normal compressibility, respiratory phasicity and response to augmentation. Popliteal Vein: No evidence of thrombus. Normal compressibility, respiratory phasicity and response to augmentation. Calf Veins: No evidence of thrombus. Normal compressibility and flow on color Doppler imaging. Superficial Great Saphenous Vein: No evidence of thrombus. Normal compressibility and flow on color Doppler imaging. Other Findings:  None. LEFT LOWER EXTREMITY Common Femoral Vein: No evidence of thrombus. Normal compressibility, respiratory phasicity and response to augmentation. Saphenofemoral Junction: No evidence of thrombus. Normal compressibility and flow on color Doppler imaging. Profunda Femoral Vein: No evidence of thrombus. Normal compressibility and flow on color Doppler imaging. Femoral Vein: No evidence of thrombus. Normal compressibility, respiratory phasicity and response to augmentation. Popliteal Vein: No evidence of thrombus. Normal compressibility, respiratory phasicity and response to augmentation. Calf Veins: No evidence of thrombus. Normal compressibility and flow on color Doppler imaging. Superficial Great Saphenous Vein: No evidence of thrombus. Normal compressibility and flow on color Doppler imaging. Other Findings:  None. IMPRESSION: Sonographic survey of the bilateral lower extremities negative for DVT Electronically Signed   By: Corrie Mckusick D.O.  On: 11/20/2020 11:49   DG Chest Portable 1 View  Result Date: 12/10/2020 CLINICAL DATA:  Right lower extremity wound. EXAM: PORTABLE CHEST 1 VIEW COMPARISON:  04/18/2012. FINDINGS: Cardiomegaly with mild bilateral interstitial prominence and tiny bilateral pleural effusions. Findings suggest CHF. Pneumonitis cannot be excluded. No acute bony abnormality. IMPRESSION: Findings suggesting mild CHF. Electronically Signed   By: [place]   On: 12/10/2020 04:34    [place] RT LOWER EXTREM LTD SOFT TISSUE NON VASCULAR  Result Date: 12/14/2020 CLINICAL DATA:  Skin wound on the right lower leg. Possible abscess. EXAM: ULTRASOUND RIGHT LOWER EXTREMITY LIMITED TECHNIQUE: Ultrasound examination of the lower extremity soft tissues was performed in the area of clinical concern. COMPARISON:  CT right lower leg 12/10/2020. FINDINGS: Scanning directed toward the region of concern demonstrates a complex fluid collection measuring 4.0 x 0.9 x 3.2 cm. There is blood flow on Doppler imaging around the collection. No solid lesion is identified. IMPRESSION: Findings most compatible with a subcutaneous abscess in the region of concern. Electronically Signed   By: Inge Rise M.D.   On: 12/14/2020 11:11      Subjective: No issues. Leg pain is better than yesterday.  Discharge Exam: Vitals:   12/18/20 0334 12/18/20 1341  BP: (!) 167/90 136/77  Pulse:  99  Resp: 20 19  Temp: 98.9 F (37.2 C)   SpO2: 98% 94%   Vitals:   12/17/20 2010 12/17/20 2341 12/18/20 0334 12/18/20 1341  BP: (!) 170/90 (!) 179/84 (!) 167/90 136/77  Pulse: (!) 110 (!) 103  99  Resp: _0 Temp: 98.5 F (36.9 C) 98.9 F (37.2 C) 98.9 F (37.2 C)   TempSrc: Oral Oral Oral   SpO2: 100% 98% 98% 94%  Weight:      Height:        General: Pt is alert, awake, not in acute distress Cardiovascular: RRR, S1/S2 +, no rubs, no gallops Respiratory: CTA bilaterally, no wheezing, no rhonchi Abdominal: Soft, NT, ND, bowel sounds + Extremities: no edema, no cyanosis    The results of significant diagnostics from this hospitalization (including imaging, microbiology, ancillary and laboratory) are listed below for reference.     Microbiology: Recent Results (from the past 240 hour(s))  Blood culture (routine x 2)     Status: None   Collection Time: 12/10/20  3:10 AM   Specimen: BLOOD  Result Value Ref Range Status   Specimen Description BLOOD LEFT ANTECUBITAL  Final   Special Requests    Final    BOTTLES DRAWN AEROBIC AND ANAEROBIC Blood Culture adequate volume   Culture   Final    NO GROWTH 5 DAYS Performed at [facility], [address] [place], [place] [address]    Report Status 12/15/2020 FINAL  Final  Blood culture (routine x 2)     Status: None   Collection Time: 12/10/20  3:10 AM   Specimen: BLOOD  Result Value Ref Range Status   Specimen Description BLOOD RIGHT ANTECUBITAL  Final   Special Requests   Final    BOTTLES DRAWN AEROBIC AND ANAEROBIC Blood Culture adequate volume   Culture   Final    NO GROWTH 5 DAYS Performed at [facility], [place] [address], [place], [place] [address]    Report Status 12/15/2020 FINAL  Final  Resp Panel by RT-PCR (Flu A&B, Covid) Nasopharyngeal Swab     Status: None   Collection Time: 12/10/20  4:34 AM   Specimen:  Nasopharyngeal Swab; Nasopharyngeal(NP) swabs in vial transport medium  Result Value Ref Range Status   SARS Coronavirus 2 by RT PCR NEGATIVE NEGATIVE Final    Comment: (NOTE) SARS-CoV-2 target nucleic acids are NOT DETECTED.  The SARS-CoV-2 RNA is generally detectable in upper respiratory specimens during the acute phase of infection. The lowest concentration of SARS-CoV-2 viral copies this assay can detect is 138 copies/mL. A negative result does not preclude SARS-Cov-2 infection and should not be used as the sole basis for treatment or other patient management decisions. A negative result may occur with  improper specimen collection/handling, submission of specimen other than nasopharyngeal swab, presence of viral mutation(s) within the areas targeted by this assay, and inadequate number of viral copies(<138 copies/mL). A negative result must be combined with clinical observations, patient history, and epidemiological information. The expected result is Negative.  Fact Sheet for Patients:  EntrepreneurPulse.com.au  Fact Sheet for Healthcare Providers:   IncredibleEmployment.be  This test is no t yet approved or cleared by the [place] FDA and  has been authorized for detection and/or diagnosis of SARS-CoV-2 by FDA under an Emergency Use Authorization (EUA). This EUA will remain  in effect (meaning this test can be used) for the duration of the COVID-19 declaration under Section 564(b)(1) of the Act, 21 U.S.C.section 360bbb-3(b)(1), unless the authorization is terminated  or revoked sooner.       Influenza A by PCR NEGATIVE NEGATIVE Final   Influenza B by PCR NEGATIVE NEGATIVE Final    Comment: (NOTE) The Xpert Xpress SARS-CoV-2/FLU/RSV plus assay is intended as an aid in the diagnosis of influenza from Nasopharyngeal swab specimens and should not be used as a sole basis for treatment. Nasal washings and aspirates are unacceptable for Xpert Xpress SARS-CoV-2/FLU/RSV testing.  Fact Sheet for Patients: EntrepreneurPulse.com.au  Fact Sheet for Healthcare Providers: IncredibleEmployment.be  This test is not yet approved or cleared by the [place] FDA and has been authorized for detection and/or diagnosis of SARS-CoV-2 by FDA under an Emergency Use Authorization (EUA). This EUA will remain in effect (meaning this test can be used) for the duration of the COVID-19 declaration under Section 564(b)(1) of the Act, 21 U.S.C. section 360bbb-3(b)(1), unless the authorization is terminated or revoked.  Performed at [facility], [place] [address], [place], [place] [address]   Aerobic/Anaerobic Culture w Gram Stain (surgical/deep wound)     Status: None (Preliminary result)   Collection Time: 12/14/20  6:00 PM   Specimen: Abscess  Result Value Ref Range Status   Specimen Description ABSCESS  Final   Special Requests RIGHT LOWER LEG SPEC A  Final   Gram Stain   Final    ABUNDANT WBC PRESENT, PREDOMINANTLY PMN RARE GRAM POSITIVE RODS RARE GRAM POSITIVE COCCI IN  CLUSTERS Performed at [facility], [place] [address] [place], [place] [address]    Culture   Final    RARE METHICILLIN RESISTANT STAPHYLOCOCCUS AUREUS NO ANAEROBES ISOLATED; CULTURE IN PROGRESS FOR 5 DAYS    Report Status PENDING  Incomplete   Organism ID, Bacteria METHICILLIN RESISTANT STAPHYLOCOCCUS AUREUS  Final      Susceptibility   Methicillin resistant staphylococcus aureus - MIC*    CIPROFLOXACIN <=0.5 SENSITIVE Sensitive     ERYTHROMYCIN <=0.25 SENSITIVE Sensitive     GENTAMICIN <=0.5 SENSITIVE Sensitive     OXACILLIN >=4 RESISTANT Resistant     TETRACYCLINE >=16 RESISTANT Resistant     VANCOMYCIN 1 SENSITIVE Sensitive     TRIMETH/SULFA <=10  SENSITIVE Sensitive     CLINDAMYCIN <=0.25 SENSITIVE Sensitive     RIFAMPIN <=0.5 SENSITIVE Sensitive     Inducible Clindamycin NEGATIVE Sensitive     * RARE METHICILLIN RESISTANT STAPHYLOCOCCUS AUREUS     Labs: BNP (last 3 results) Recent Labs    12/10/20 0300  BNP 21.9   Basic Metabolic Panel: Recent Labs  Lab 12/12/20 0208 12/13/20 0752 12/15/20 0050 12/17/20 0458  NA 135 136 138 138  K 2.9* 3.5 3.4* 3.5  CL 102 105 104 101  CO2 _0 GLUCOSE 227* 342* 273* 232*  BUN 7 7 <5* <5*  CREATININE 0.62 0.84 0.66 0.58  CALCIUM 9.2 9.4 8.9 9.2  MG 1.8  --   --   --    Liver Function Tests: No results for input(s): AST, ALT, ALKPHOS, BILITOT, PROT, ALBUMIN in the last 168 hours. No results for input(s): LIPASE, AMYLASE in the last 168 hours. No results for input(s): AMMONIA in the last 168 hours. CBC: Recent Labs  Lab 12/17/20 0458  WBC 7.3  HGB 14.3  HCT 41.7  MCV 89.9  PLT 254   Cardiac Enzymes: No results for input(s): CKTOTAL, CKMB, CKMBINDEX, TROPONINI in the last 168 hours. BNP: Invalid input(s): POCBNP CBG: Recent Labs  Lab 12/17/20 1132 12/17/20 1621 12/17/20 2012 12/18/20 0731 12/18/20 1129  GLUCAP 186* 155* 235* 172* 200*   D-Dimer No results for input(s): DDIMER in the last 72  hours. Hgb A1c No results for input(s): HGBA1C in the last 72 hours. Lipid Profile No results for input(s): CHOL, HDL, LDLCALC, TRIG, CHOLHDL, LDLDIRECT in the last 72 hours. Thyroid function studies No results for input(s): TSH, T4TOTAL, T3FREE, THYROIDAB in the last 72 hours.  Invalid input(s): FREET3 Anemia work up No results for input(s): VITAMINB12, FOLATE, FERRITIN, TIBC, IRON, RETICCTPCT in the last 72 hours. Urinalysis    Component Value Date/Time   COLORURINE STRAW (A) 12/10/2020 0300   APPEARANCEUR CLEAR 12/10/2020 0300   LABSPEC 1.024 12/10/2020 0300   PHURINE 6.0 12/10/2020 0300   GLUCOSEU >=500 (A) 12/10/2020 0300   HGBUR NEGATIVE 12/10/2020 0300   HGBUR negative 12/20/2007 1054   BILIRUBINUR NEGATIVE 12/10/2020 0300   BILIRUBINUR neg 02/24/2012 0905   KETONESUR NEGATIVE 12/10/2020 0300   PROTEINUR NEGATIVE 12/10/2020 0300   UROBILINOGEN 1.0 03/01/2012 1739   NITRITE NEGATIVE 12/10/2020 0300   LEUKOCYTESUR NEGATIVE 12/10/2020 0300   Sepsis Labs Invalid input(s): PROCALCITONIN,  WBC,  LACTICIDVEN Microbiology Recent Results (from the past 240 hour(s))  Blood culture (routine x 2)     Status: None   Collection Time: 12/10/20  3:10 AM   Specimen: BLOOD  Result Value Ref Range Status   Specimen Description BLOOD LEFT ANTECUBITAL  Final   Special Requests   Final    BOTTLES DRAWN AEROBIC AND ANAEROBIC Blood Culture adequate volume   Culture   Final    NO GROWTH 5 DAYS Performed at [facility], [address], [place], [place] [address]    Report Status 12/15/2020 FINAL  Final  Blood culture (routine x 2)     Status: None   Collection Time: 12/10/20  3:10 AM   Specimen: BLOOD  Result Value Ref Range Status   Specimen Description BLOOD RIGHT ANTECUBITAL  Final   Special Requests   Final    BOTTLES DRAWN AEROBIC AND ANAEROBIC Blood Culture adequate volume   Culture   Final    NO GROWTH 5 DAYS Performed at [facility], [address],  [place], [place] [address]    Report Status 12/15/2020 FINAL  Final  Resp Panel by RT-PCR (Flu A&B, Covid) Nasopharyngeal Swab     Status: None   Collection Time: 12/10/20  4:34 AM   Specimen: Nasopharyngeal Swab; Nasopharyngeal(NP) swabs in vial transport medium  Result Value Ref Range Status   SARS Coronavirus 2 by RT PCR NEGATIVE NEGATIVE Final    Comment: (NOTE) SARS-CoV-2 target nucleic acids are NOT DETECTED.  The SARS-CoV-2 RNA is generally detectable in upper respiratory specimens during the acute phase of infection. The lowest concentration of SARS-CoV-2 viral copies this assay can detect is 138 copies/mL. A negative result does not preclude SARS-Cov-2 infection and should not be used as the sole basis for treatment or other patient management decisions. A negative result may occur with  improper specimen collection/handling, submission of specimen other than nasopharyngeal swab, presence of viral mutation(s) within the areas targeted by this assay, and inadequate number of viral copies(<138 copies/mL). A negative result must be combined with clinical observations, patient history, and epidemiological information. The expected result is Negative.  Fact Sheet for Patients:  EntrepreneurPulse.com.au  Fact Sheet for Healthcare Providers:  IncredibleEmployment.be  This test is no t yet approved or cleared by the [place] FDA and  has been authorized for detection and/or diagnosis of SARS-CoV-2 by FDA under an Emergency Use Authorization (EUA). This EUA will remain  in effect (meaning this test can be used) for the duration of the COVID-19 declaration under Section 564(b)(1) of the Act, 21 U.S.C.section 360bbb-3(b)(1), unless the authorization is terminated  or revoked sooner.       Influenza A by PCR NEGATIVE NEGATIVE Final   Influenza B by PCR NEGATIVE NEGATIVE Final    Comment: (NOTE) The Xpert Xpress SARS-CoV-2/FLU/RSV plus assay is  intended as an aid in the diagnosis of influenza from Nasopharyngeal swab specimens and should not be used as a sole basis for treatment. Nasal washings and aspirates are unacceptable for Xpert Xpress SARS-CoV-2/FLU/RSV testing.  Fact Sheet for Patients: EntrepreneurPulse.com.au  Fact Sheet for Healthcare Providers: IncredibleEmployment.be  This test is not yet approved or cleared by the [place] FDA and has been authorized for detection and/or diagnosis of SARS-CoV-2 by FDA under an Emergency Use Authorization (EUA). This EUA will remain in effect (meaning this test can be used) for the duration of the COVID-19 declaration under Section 564(b)(1) of the Act, 21 U.S.C. section 360bbb-3(b)(1), unless the authorization is terminated or revoked.  Performed at [facility], [place] [address] [place], [place] [address]   Aerobic/Anaerobic Culture w Gram Stain (surgical/deep wound)     Status: None (Preliminary result)   Collection Time: 12/14/20  6:00 PM   Specimen: Abscess  Result Value Ref Range Status   Specimen Description ABSCESS  Final   Special Requests RIGHT LOWER LEG SPEC A  Final   Gram Stain   Final    ABUNDANT WBC PRESENT, PREDOMINANTLY PMN RARE GRAM POSITIVE RODS RARE GRAM POSITIVE COCCI IN CLUSTERS Performed at [facility], [place] [address], [place], [place] [address]    Culture   Final    RARE METHICILLIN RESISTANT STAPHYLOCOCCUS AUREUS NO ANAEROBES ISOLATED; CULTURE IN PROGRESS FOR 5 DAYS    Report Status PENDING  Incomplete   Organism ID, Bacteria METHICILLIN RESISTANT STAPHYLOCOCCUS AUREUS  Final      Susceptibility   Methicillin resistant staphylococcus aureus - MIC*    CIPROFLOXACIN <=0.5 SENSITIVE Sensitive  ERYTHROMYCIN <=0.25 SENSITIVE Sensitive     GENTAMICIN <=0.5 SENSITIVE Sensitive     OXACILLIN >=4 RESISTANT Resistant     TETRACYCLINE >=16 RESISTANT Resistant     VANCOMYCIN 1 SENSITIVE Sensitive      TRIMETH/SULFA <=10 SENSITIVE Sensitive     CLINDAMYCIN <=0.25 SENSITIVE Sensitive     RIFAMPIN <=0.5 SENSITIVE Sensitive     Inducible Clindamycin NEGATIVE Sensitive     * RARE METHICILLIN RESISTANT STAPHYLOCOCCUS AUREUS     Time coordinating discharge: 35 minutes  SIGNED:   Cordelia Poche, MD [facility] 12/18/2020, 2:34 PM

## 2020-12-18 NOTE — Plan of Care (Signed)
  RD consulted for further nutrition education regarding diabetes (please see education note from 7/29 for details regarding initial education).   Lab Results  Component Value Date   HGBA1C 10.6 (H) 12/10/2020   Pt reports issues with understanding why she is being limited on food choices by [facility] staff when she is placing meal orders. Discussed how [facility] moderates meal ordering and the specifics of what a carb modified diet is per pt's request. Reviewed concept of carb counting with pt and discussed carb goals for meals and snacks both while admitted and after discharge. Reassured pt that she will have a list of foods & their carbohydrate content per serving size printed out at discharge & will have outpatient DM education to aid in providing support as pt learns new skill of carb counting.   Expect fair compliance.  Body mass index is 48.89 kg/m. Pt meets criteria for morbid obestiy based on current BMI.  Current diet order is carb modified, patient is consuming approximately 80-100% of meals at this time. Labs and medications reviewed. No further nutrition interventions warranted at this time. RD contact information provided. If additional nutrition issues arise, please re-consult RD.  Eugene Gavia, MS, RD, LDN (she/her/hers) RD pager number and weekend/on-call pager number located in Amion.

## 2020-12-18 NOTE — TOC Transition Note (Signed)
Transition of Care [facility]) - CM/SW Discharge Note   Patient Details  Name: [name] MRN: [id number] Date of Birth: June 13, 1960  [facility] CM/SW Contact:  Lorri Frederick, LCSW Phone Number: 12/18/2020, 4:13 PM   Clinical Narrative:  Pt discharging home with [facility].  No DME needed.  Pt reports her brother will provide transportation home.  No other needs identified.       Final next level of care: Home w Home Health Services Barriers to Discharge: Barriers Resolved   Patient Goals and CMS Choice        Discharge Placement                       Discharge Plan and Services                DME Arranged: N/A         HH Arranged: PT, OT HH Agency: [facility] Date [facility] Agency Contacted:  (Not sure-done by other CSW)      Social Determinants of Health (SDOH) Interventions     Readmission Risk Interventions No flowsheet data found.

## 2020-12-19 LAB — AEROBIC/ANAEROBIC CULTURE W GRAM STAIN (SURGICAL/DEEP WOUND)

## 2020-12-21 ENCOUNTER — Emergency Department (HOSPITAL_COMMUNITY)
Admission: EM | Admit: 2020-12-21 | Discharge: 2020-12-21 | Disposition: A | Discharge status: Home / Self Care | Payer: Medicare Other | Attending: Student | Admitting: Student

## 2020-12-21 ENCOUNTER — Emergency Department (HOSPITAL_COMMUNITY): Payer: Medicare Other

## 2020-12-21 ENCOUNTER — Other Ambulatory Visit: Payer: Self-pay

## 2020-12-21 ENCOUNTER — Encounter (HOSPITAL_COMMUNITY): Payer: Self-pay | Admitting: Emergency Medicine

## 2020-12-21 DIAGNOSIS — F17210 Nicotine dependence, cigarettes, uncomplicated: Secondary | ICD-10-CM | POA: Diagnosis not present

## 2020-12-21 DIAGNOSIS — Z96652 Presence of left artificial knee joint: Secondary | ICD-10-CM | POA: Diagnosis not present

## 2020-12-21 DIAGNOSIS — Z794 Long term (current) use of insulin: Secondary | ICD-10-CM | POA: Diagnosis not present

## 2020-12-21 DIAGNOSIS — E1140 Type 2 diabetes mellitus with diabetic neuropathy, unspecified: Secondary | ICD-10-CM | POA: Insufficient documentation

## 2020-12-21 DIAGNOSIS — Z79899 Other long term (current) drug therapy: Secondary | ICD-10-CM | POA: Diagnosis not present

## 2020-12-21 DIAGNOSIS — S81801A Unspecified open wound, right lower leg, initial encounter: Secondary | ICD-10-CM | POA: Diagnosis not present

## 2020-12-21 DIAGNOSIS — X58XXXA Exposure to other specified factors, initial encounter: Secondary | ICD-10-CM | POA: Diagnosis not present

## 2020-12-21 DIAGNOSIS — Z4801 Encounter for change or removal of surgical wound dressing: Secondary | ICD-10-CM | POA: Diagnosis not present

## 2020-12-21 DIAGNOSIS — I10 Essential (primary) hypertension: Secondary | ICD-10-CM | POA: Insufficient documentation

## 2020-12-21 DIAGNOSIS — S8992XA Unspecified injury of left lower leg, initial encounter: Secondary | ICD-10-CM | POA: Diagnosis present

## 2020-12-21 LAB — CBG MONITORING, ED
Glucose-Capillary: 117 mg/dL — ABNORMAL HIGH (ref 70–99)
Glucose-Capillary: 270 mg/dL — ABNORMAL HIGH (ref 70–99)

## 2020-12-21 LAB — C-REACTIVE PROTEIN: CRP: 0.7 mg/dL (ref <1.0)

## 2020-12-21 LAB — CBC WITH DIFFERENTIAL/PLATELET
Abs Immature Granulocytes: 0.07 10*3/uL (ref 0.00–0.07)
Basophils Absolute: 0 10*3/uL (ref 0.0–0.1)
Basophils Relative: 1 %
Eosinophils Absolute: 0.1 10*3/uL (ref 0.0–0.5)
Eosinophils Relative: 1 %
HCT: 40.9 % (ref 36.0–46.0)
Hemoglobin: 13.4 g/dL (ref 12.0–15.0)
Immature Granulocytes: 1 %
Lymphocytes Relative: 38 %
Lymphs Abs: 2.8 10*3/uL (ref 0.7–4.0)
MCH: 29.9 pg (ref 26.0–34.0)
MCHC: 32.8 g/dL (ref 30.0–36.0)
MCV: 91.3 fL (ref 80.0–100.0)
Monocytes Absolute: 0.8 10*3/uL (ref 0.1–1.0)
Monocytes Relative: 10 %
Neutro Abs: 3.6 10*3/uL (ref 1.7–7.7)
Neutrophils Relative %: 49 %
Platelets: 347 10*3/uL (ref 150–400)
RBC: 4.48 MIL/uL (ref 3.87–5.11)
RDW: 13 % (ref 11.5–15.5)
WBC: 7.4 10*3/uL (ref 4.0–10.5)
nRBC: 0 % (ref 0.0–0.2)

## 2020-12-21 LAB — COMPREHENSIVE METABOLIC PANEL
ALT: 23 U/L (ref 0–44)
AST: 18 U/L (ref 15–41)
Albumin: 3.6 g/dL (ref 3.5–5.0)
Alkaline Phosphatase: 69 U/L (ref 38–126)
Anion gap: 9 (ref 5–15)
BUN: 9 mg/dL (ref 6–20)
CO2: 27 mmol/L (ref 22–32)
Calcium: 9.5 mg/dL (ref 8.9–10.3)
Chloride: 106 mmol/L (ref 98–111)
Creatinine, Ser: 0.59 mg/dL (ref 0.44–1.00)
GFR, Estimated: >60 mL/min (ref >60)
Glucose, Bld: 98 mg/dL (ref 70–99)
Potassium: 3.5 mmol/L (ref 3.5–5.1)
Sodium: 142 mmol/L (ref 135–145)
Total Bilirubin: 0.2 mg/dL — ABNORMAL LOW (ref 0.3–1.2)
Total Protein: 6.9 g/dL (ref 6.5–8.1)

## 2020-12-21 LAB — LACTIC ACID, PLASMA: Lactic Acid, Venous: 1.1 mmol/L (ref 0.5–1.9)

## 2020-12-21 LAB — SEDIMENTATION RATE: Sed Rate: 21 mm/hr (ref 0–22)

## 2020-12-21 NOTE — ED Provider Notes (Signed)
Emergency Medicine Provider Triage Evaluation Note  [name] , a 60 y.o. female  was evaluated in triage.  Pt complains of concern for leg infection.  States that she was admitted to the hospital 7/27-8/3.  Patient was admitted for altered mental status.  While in the hospital patient had I&D in operating room to abscess to right lower leg.  States that she has developed a new wound to right lower extremity this wound has increased in size and is putting out purulent discharge.  Patient states that wound from incision and drainage is "redder on the inside."  Patient reports minimal purulent discharge from this wound.  Denies any fevers, chills, nausea, vomiting.  Review of Systems  Positive: Wound, purulent discharge Negative: Fever, chills, nausea, vomiting  Physical Exam  BP 136/86 (BP Location: Left Arm)   Pulse 96   Temp 98.7 F (37.1 C) (Oral)   Resp 18   SpO2 98%  Gen:   Awake, no distress   Resp:  Normal effort  MSK:   Moves extremities without difficulty, 2 wounds noted to anterior aspect of right lower leg.  Minimal surrounding erythema or purulent discharge noted. Other:    Medical Decision Making  Medically screening exam initiated at 2:06 PM.  Appropriate orders placed.  LUDY MESSAMORE was informed that the remainder of the evaluation will be completed by another provider, this initial triage assessment does not replace that evaluation, and the importance of remaining in the ED until their evaluation is complete.  The patient appears stable so that the remainder of the work up may be completed by another provider.      Haskel Schroeder, PA-C 12/21/20 1409    Gerhard Munch, MD 12/21/20 [phone]

## 2020-12-21 NOTE — Discharge Instructions (Addendum)
You were seen in the emergency department for evaluation of a wound check.  Your wound appears to be healing well and you are doing a good job packing the wound.  The fluid you were seen is called "granulation tissue" and is indicative of proper wound healing.  I have placed a referral to the wound clinic and I will be important for you to call them to schedule an appointment to ensure proper wound care for your leg wound.  Please return the emergency department if you are experiencing fever, nausea, vomiting, chills, chest pain, shortness of breath or other concerning symptoms.

## 2020-12-21 NOTE — ED Notes (Signed)
Pt states she has missed her afternoon and evening dose of insulin Pt CBG 270 Pt request insulin

## 2020-12-21 NOTE — ED Triage Notes (Signed)
Pt BIB EMS- recently dx 8/3 after hyperglycemia, abscess to RLE.   Pt reports abscess looks infected again, very painful.

## 2020-12-22 NOTE — ED Provider Notes (Signed)
[facility] DEPT Provider Note   CSN: [id number] Arrival date & time: 12/21/20  1322     History No chief complaint on file.   [name] is a 60 y.o. female Who presents to the emergency department For evaluation of a wound check. The patient states that she had an abscess drained in her right lower extremity during the last hospital admission and has been performing wound care at home using iodoform gauze and compression bandages. She presents today because she feels mild increased amount of pain in the right lower extremity and has noticed "pus" Coming from the wound site. She denies fever, chest pain, shortness of breath, nausea, vomiting or any systemic symptoms surrounding this wound. She has not noticed any surrounding erythema, warmth.  HPI     Past Medical History:  Diagnosis Date   Diabetes mellitus    Hypertension    Multiple sclerosis exacerbation ([place])    Neuropathy     Patient Active Problem List   Diagnosis Date Noted   Acute metabolic encephalopathy 56/81/2751   Hyperosmolar non-ketotic state due to type 2 diabetes mellitus ([place]) 12/10/2020   Leg wound, right, initial encounter 12/10/2020   Hyperglycemia 12/10/2020   Essential tremor 07/15/2020   Gait disturbance 06/13/2020   High risk medication use 12/08/2019   Left carpal tunnel syndrome 08/19/2012   Depression with anxiety 08/19/2012   Lumbar spondylosis 03/07/2012   Osteoarthrosis, unspecified whether generalized or localized, lower leg 03/07/2012   Diabetic peripheral neuropathy ([place]) 03/07/2012   SMOKER 06/02/2010   SIMPLE CHRONIC BRONCHITIS 06/02/2010   DYSPNEA 06/02/2010   SKIN RASH 08/05/2009   ACUTE CYSTITIS 05/03/2009   DIABETES MELLITUS 02/18/2009   INSOMNIA 02/12/2009   Obesity, Class III, BMI 40-49.9 (morbid obesity) ([place]) 11/28/2008   LIVER FUNCTION TESTS, ABNORMAL, HX OF 11/28/2008   DEGENERATION INTERVERTEBRAL DISC SITE UNSPEC 07/02/2008   Other  chronic pain 05/22/2008   BLURRED VISION 05/22/2008   ESOPHAGEAL REFLUX 01/10/2008   Multiple sclerosis ([place]) 12/09/2007   URINARY RETENTION 12/09/2007   TACHYCARDIA 11/17/2007   Essential hypertension 11/08/2007    Past Surgical History:  Procedure Laterality Date   ABDOMINAL HYSTERECTOMY     partial   I & D EXTREMITY Right 12/14/2020   Procedure: IRRIGATION AND DEBRIDEMENT RIGHT LOWER EXTREMITY;  Surgeon: Rod Can, MD;  Location: [facility];  Service: Orthopedics;  Laterality: Right;   JOINT REPLACEMENT     LTK   TUBAL LIGATION       OB History   No obstetric history on file.     Family History  Problem Relation Age of Onset   Diabetes Mother    Hypertension Mother    Stroke Father     Social History   Tobacco Use   Smoking status: Every Day    Packs/day: 0.25    Years: 12.00    Pack years: 3.00    Types: Cigarettes   Smokeless tobacco: Never   Tobacco comments:    06/13/20 1 pk per 3 days  Substance Use Topics   Alcohol use: No   Drug use: No    Home Medications Prior to Admission medications   Medication Sig Start Date End Date Taking? Authorizing Provider  Accu-Chek Softclix Lancets lancets Use as directed up to 4 times daily 12/18/20   Mariel Aloe, MD  amitriptyline (ELAVIL) 25 MG tablet Take 1 tablet (25 mg total) by mouth at bedtime. 10/07/20   Sater, Nanine Means, MD  amLODipine (NORVASC) 5  MG tablet Take 1 tablet (5 mg total) by mouth daily. 12/18/20 03/18/21  Mariel Aloe, MD  ANTI-DIARRHEAL 2 MG tablet Take 2 mg by mouth as needed for diarrhea or loose stools.  07/31/19   [provider]  atorvastatin (LIPITOR) 80 MG tablet Take 1 tablet (80 mg total) by mouth daily. 02/24/12   Copland, Gay Filler, MD  baclofen (LIORESAL) 20 MG tablet Take 20 mg by mouth 2 (two) times daily.  08/28/19   [provider]  Biotin w/ Vitamins C & E (HAIR/SKIN/NAILS PO) Take 1 tablet by mouth daily.    [provider]  Blood Glucose Monitoring  Suppl (BLOOD GLUCOSE MONITOR SYSTEM) w/Device KIT use as directed 12/18/20   Mariel Aloe, MD  busPIRone (BUSPAR) 15 MG tablet Take 15 mg by mouth 3 (three) times daily. 10/28/19   [provider]  dalfampridine 10 MG TB12 One po q12 hours 11/13/20   Sater, Nanine Means, MD  dicyclomine (BENTYL) 20 MG tablet Take 20 mg by mouth in the morning and at bedtime. 10/23/19   [provider]  diphenhydramine-acetaminophen (TYLENOL PM) 25-500 MG TABS tablet Take 1 tablet by mouth at bedtime as needed (pain/sleep).    [provider]  diphenoxylate-atropine (LOMOTIL) 2.5-0.025 MG per tablet Take 1 tablet by mouth 2 (two) times daily. PATIENT NEEDS OFFICE VISIT FOR ADDITIONAL REFILLS 10/18/12   Copland, Gay Filler, MD  DULoxetine (CYMBALTA) 60 MG capsule Take 60 mg by mouth 2 (two) times daily.  10/04/19   [provider]  Echinacea 400 MG CAPS Take 400 mg by mouth in the morning and at bedtime.    [provider]  fluticasone (FLONASE) 50 MCG/ACT nasal spray Place 2 sprays into the nose daily. 02/24/12   Copland, Gay Filler, MD  glucose blood (ACCU-CHEK GUIDE) test strip Use as instructed up to 4 times daily 12/18/20   Mariel Aloe, MD  Homeopathic Products (LEG CRAMPS PO) Take 1-2 tablets by mouth daily as needed (For leg cramps).    [provider]  ibuprofen (ADVIL) 800 MG tablet Take 800 mg by mouth 3 (three) times daily as needed for moderate pain.  09/25/19   [provider]  insulin glargine (LANTUS) 100 UNIT/ML Solostar Pen Inject 40 Units into the skin daily. 12/18/20   Mariel Aloe, MD  insulin lispro (HUMALOG) 100 UNIT/ML KwikPen Inject 10 Units into the skin 3 (three) times daily with meals. 12/18/20   Mariel Aloe, MD  Insulin Pen Needle 32G X 4 MM MISC use as directed 4 times daily 12/18/20   Mariel Aloe, MD  lamoTRIgine (LAMICTAL) 200 MG tablet One po twicw daily 06/13/20   Sater, Nanine Means, MD  losartan (COZAAR) 100 MG tablet Take 1 tablet  (100 mg total) by mouth daily. 02/24/12   Copland, Gay Filler, MD  MAGNESIUM-OXIDE 400 (241.3 Mg) MG tablet Take 1 tablet by mouth daily. 10/18/19   [provider]  MELATONIN MAXIMUM STRENGTH 5 MG TABS Take 15 mg by mouth at bedtime. 07/31/19   [provider]  montelukast (SINGULAIR) 10 MG tablet Take 10 mg by mouth at bedtime. 07/31/19   [provider]  MUCUS RELIEF 400 MG TABS tablet Take 400 mg by mouth every 6 (six) hours as needed (For mucus). 07/31/19   [provider]  Multiple Vitamins-Calcium (ONE-A-DAY WOMENS FORMULA) TABS Take 1 tablet by mouth daily. 07/31/19   [provider]  NARCAN 4 MG/0.1ML LIQD nasal spray  kit Place 1 spray into the nose as directed. overdose 10/11/19   [provider]  oxyCODONE (ROXICODONE) 15 MG immediate release tablet Take 15 mg by mouth every 6 (six) hours as needed for pain. 09/11/19   [provider]  pantoprazole (PROTONIX) 40 MG tablet Take 40 mg by mouth 2 (two) times daily. 10/09/19   [provider]  TECFIDERA 240 MG CPDR Take 1 capsule by mouth 2 (two) times daily. 10/31/19   [provider]  tiZANidine (ZANAFLEX) 2 MG tablet Take 2 mg by mouth 4 (four) times daily. Patient takes up to four times daily 10/23/19   [provider]  VIBERZI 100 MG TABS Take 1 tablet by mouth 2 (two) times daily. 11/07/19   [provider]  vitamin B-12 (CYANOCOBALAMIN) 1000 MCG tablet Take 1,000 mcg by mouth daily.  07/31/19   [provider]  VITAMIN D PO Take 1 tablet by mouth daily.    [provider]  XIIDRA 5 % SOLN Place 1 drop into both eyes in the morning and at bedtime.  11/10/19   [provider]  zolpidem (AMBIEN) 10 MG tablet Take 10 mg by mouth at bedtime.  10/28/19   [provider]    Allergies    Patient has no known allergies.  Review of Systems   Review of Systems  Constitutional:  Negative for chills and fever.  HENT:  Negative  for ear pain and sore throat.   Eyes:  Negative for pain and visual disturbance.  Respiratory:  Negative for cough and shortness of breath.   Cardiovascular:  Negative for chest pain and palpitations.  Gastrointestinal:  Negative for abdominal pain and vomiting.  Genitourinary:  Negative for dysuria and hematuria.  Musculoskeletal:  Negative for arthralgias and back pain.  Skin:  Positive for wound. Negative for color change and rash.  Neurological:  Negative for seizures and syncope.  All other systems reviewed and are negative.  Physical Exam Updated Vital Signs BP (!) 176/112 (BP Location: Right Arm)   Pulse 89   Temp 98.6 F (37 C) (Oral)   Resp 20   SpO2 96%   Physical Exam Vitals and nursing note reviewed.  Constitutional:      General: She is not in acute distress.    Appearance: She is well-developed.  HENT:     Head: Normocephalic and atraumatic.  Eyes:     Conjunctiva/sclera: Conjunctivae normal.  Cardiovascular:     Rate and Rhythm: Normal rate and regular rhythm.     Heart sounds: No murmur heard. Pulmonary:     Effort: Pulmonary effort is normal. No respiratory distress.     Breath sounds: Normal breath sounds.  Abdominal:     Palpations: Abdomen is soft.     Tenderness: There is no abdominal tenderness.  Musculoskeletal:     Cervical back: Neck supple.  Skin:    General: Skin is warm and dry.     Findings: Lesion (two healing wounds to RLE with granulation tissue) present.  Neurological:     Mental Status: She is alert.    ED Results / Procedures / Treatments   Labs (all labs ordered are listed, but only abnormal results are displayed) Labs Reviewed  COMPREHENSIVE METABOLIC PANEL - Abnormal; Notable for the following components:      Result Value   Total Bilirubin 0.2 (*)    All other components within normal limits  CBG MONITORING, ED - Abnormal; Notable for the following components:  Glucose-Capillary 117 (*)    All other components within  normal limits  CBG MONITORING, ED - Abnormal; Notable for the following components:   Glucose-Capillary 270 (*)    All other components within normal limits  LACTIC ACID, PLASMA  CBC WITH DIFFERENTIAL/PLATELET  SEDIMENTATION RATE  C-REACTIVE PROTEIN    EKG None  Radiology DG Tibia/Fibula Right  Result Date: 12/21/2020 CLINICAL DATA:  Pain and swelling of the right lower extremity. EXAM: RIGHT TIBIA AND FIBULA - 2 VIEW COMPARISON:  CT scan 12/10/2020 FINDINGS: Stable advanced knee joint degenerative changes. The tibia and fibula are intact. No findings suspicious for osteomyelitis. IMPRESSION: 1. No acute bony findings. 2. Advanced knee joint degenerative changes. Electronically Signed   By: Marijo Sanes M.D.   On: 12/21/2020 14:53    Procedures Procedures   Medications Ordered in ED Medications - No data to display  ED Course  I have reviewed the triage vital signs and the nursing notes.  Pertinent labs & imaging results that were available during my care of the patient were reviewed by me and considered in my medical decision making (see chart for details).    MDM Rules/Calculators/A&P                           Patient in emergency Department for a valuation of a wound check. Physical exam reveals to wounds to the RLE over the shin area That appeared to be healing appropriately with well vascularized granulation tissue. The fluid that the patient was describing appears to be physiologic fluid around well healing granulation tissue. The The patient states that she did not take her insulin because of her long wait time in the emergency department lobby and thus presents with a POC glucose of 270. ESR CRP and RLE XR negative for signs of infection or osteo. I discussed physiologic wound healing stages to the patient, and an ambulatory referral was placed to the wound clinic. Patient discharged with wound clinic follow up. Final Clinical Impression(s) / ED Diagnoses Final diagnoses:   Wound of right lower extremity, initial encounter    Rx / DC Orders ED Discharge Orders          Ordered    Ambulatory referral to Wound Clinic        12/21/20 2302             Teressa Lower, MD 12/22/20 0111

## 2020-12-30 ENCOUNTER — Encounter (HOSPITAL_BASED_OUTPATIENT_CLINIC_OR_DEPARTMENT_OTHER): Payer: Medicare Other | Admitting: Internal Medicine

## 2021-01-02 ENCOUNTER — Ambulatory Visit: Payer: Medicare Other | Admitting: Podiatry

## 2021-01-07 ENCOUNTER — Telehealth: Payer: Self-pay | Admitting: Neurology

## 2021-01-07 NOTE — Telephone Encounter (Signed)
Duplicate, see other phone note. 

## 2021-01-07 NOTE — Telephone Encounter (Signed)
I called patient.  She reports that she was recently hospitalized.  She is complaining of headaches.  She would like to see Dr. Epimenio Foot.  Patient scheduled an appointment with Dr. Epimenio Foot for January 14, 2021 at 10:00 AM.  Patient verbalized understanding of new appointment date and time.

## 2021-01-07 NOTE — Telephone Encounter (Signed)
Pt called states she would like a sooner appt. She was just released from the hospital and she is experiencing severe headaches. Pt requesting a call back.

## 2021-01-08 ENCOUNTER — Ambulatory Visit: Payer: Medicare Other | Admitting: Podiatry

## 2021-01-14 ENCOUNTER — Ambulatory Visit: Payer: Medicare Other | Admitting: Neurology

## 2021-01-14 ENCOUNTER — Ambulatory Visit (INDEPENDENT_AMBULATORY_CARE_PROVIDER_SITE_OTHER): Payer: Medicare Other | Admitting: Neurology

## 2021-01-14 ENCOUNTER — Encounter: Payer: Self-pay | Admitting: Neurology

## 2021-01-14 ENCOUNTER — Telehealth: Payer: Self-pay | Admitting: Neurology

## 2021-01-14 ENCOUNTER — Other Ambulatory Visit: Payer: Self-pay

## 2021-01-14 VITALS — BP 182/89 | HR 91 | Ht 63.0 in | Wt 271.0 lb

## 2021-01-14 DIAGNOSIS — G35 Multiple sclerosis: Secondary | ICD-10-CM | POA: Diagnosis not present

## 2021-01-14 DIAGNOSIS — R269 Unspecified abnormalities of gait and mobility: Secondary | ICD-10-CM | POA: Diagnosis not present

## 2021-01-14 DIAGNOSIS — M79605 Pain in left leg: Secondary | ICD-10-CM | POA: Diagnosis not present

## 2021-01-14 DIAGNOSIS — E1142 Type 2 diabetes mellitus with diabetic polyneuropathy: Secondary | ICD-10-CM

## 2021-01-14 DIAGNOSIS — Z79899 Other long term (current) drug therapy: Secondary | ICD-10-CM | POA: Diagnosis not present

## 2021-01-14 DIAGNOSIS — M79604 Pain in right leg: Secondary | ICD-10-CM | POA: Diagnosis not present

## 2021-01-14 DIAGNOSIS — M47816 Spondylosis without myelopathy or radiculopathy, lumbar region: Secondary | ICD-10-CM | POA: Diagnosis not present

## 2021-01-14 MED ORDER — KETOROLAC TROMETHAMINE 60 MG/2ML IM SOLN
60.0000 mg | Freq: Once | INTRAMUSCULAR | Status: AC
  Administered 2021-01-14: 60 mg via INTRAMUSCULAR

## 2021-01-14 NOTE — Telephone Encounter (Signed)
[facility] for sedation [employer] medicare NPR faxed to [facility]. They will reach out to the patient to schedule.

## 2021-01-14 NOTE — Progress Notes (Signed)
Received VO from Dr. Epimenio Foot for toradol 60mg  IM once.  Gave toradol 30mg  in R deltoid and in L deltoid for total of 60mg  using aseptic technique.  Patient tolerated well. Pain score decreased from 10/10 to 8/10.

## 2021-01-14 NOTE — Progress Notes (Signed)
[facility]  PATIENT: [name] DOB: 06-Apr-1961  REFERRING DOCTOR OR PCP: Lucky Cowboy, FNP SOURCE: Patient, notes from primary care, imaging and lab reports, MRI images personally reviewed.  _________________________________   HISTORICAL  CHIEF COMPLAINT:  Chief Complaint  Patient presents with   Follow-up    RM 1, alone. Last seen 11/13/20. Recently in the hospital. Was having severe headaches but they are mild now. Having worsening balance. Ambulates w/ cane.    HISTORY OF PRESENT ILLNESS:  [name] is a 60 y.o. woman with multiple sclerosis, LBP and gait disturbance  Update 01/14/2021: She is noting more MS symptoms.    Specifically she feels weaker and has had some stumbles/falls.    One fall was associated with very elevated sugars > 500.  She was admitted for DM control.     She is on insulin (long lasting and regular SS)  She is on Tecficdera and tolerates it well.   The left leg is weaker than her right.      One fall was in the shower.   She came in a couple weeks ago when symptoms started.  I had her do 2 days of IV Solu-Medrol.  She is using a cane due to the falls and reduced gait.  She is fatiguing more rapidly.   She is waiting to get her electric wheelchair.   She stumbles and her legs sometimes give out.     She has a burning dysesthetic pain in her arms and legs.   She is on lamotrigine as well for pain.    Since gabapentin had not helped she stopped.  She gained weight on it .  She is having some difficulty doing activities of daily living.  Her vision is doing about the same.  She has urinary urgency and frequency.  She also has LBP and sees Dr. Christoper Fabian for pain management and is on Nucynta and oxycodone 15 mg q6  She had MBBB/RFA helped the right side but not the left side.  Surgery has been discussed with her.   She is also on tizanidine, baclofen and amitriptyline to help her pain.   She sees Dr. Maia Petties.     She has never  had seizures.   Kidney function is normal (creatinine = 0.59)  She has sleep maintenance more than sleep onset insomnia.  This is worse since she ran out of her Ambien.  Even with her pain medications and Ambien, sleep is poor (4-6 hours many nights -- falls asleep well but then wakes up).    She sees [facility] for Pain Management.   She is on oxycodone, Nucynta and zolpidem.     MS HISTORY She was diagnosed with MS in 2008 after presenting with leg weakness and headache, neck pain, back pain.  MRIs showed foci in her cervical spine and brain.   She was started on Rebif.   Initially she did well but she reports having a lot of relapses.   She was switched to Copaxone but also had more symptoms.   She was then switched to Tysabri for several years but reports having several relapses.   She switched to Tecfidera 5 years ago.   She reports having a flare in 2015 and had 5 days of IV Solu-medrol.   July 2021, she was complaining of blurry vision, stiff neck and increased burning and tingling a few weeks ago.  She went to the ED and had MRIs performed showing no  new lesions.    IMAGING: MRI of the cervical spine 11/18/2019 and 03/02/2012 showed two T2 hyperintense foci adjacent to C2 and C3-C4.  There was mild multilevel degenerative changes as well.    MRI of the brain 11/18/2019 and 03/02/2020 show a fairly low plaque burden of T2/FLAIR hyperintense foci.  Most of them are nonspecific.  A few are periventricular.  None acute and no change over time.  REVIEW OF SYSTEMS: Constitutional: No fevers, chills, sweats, or change in appetite.  She has fatigue and she reports insomnia Eyes: No visual changes, double vision, eye pain Ear, nose and throat: No hearing loss, ear pain, nasal congestion, sore throat Cardiovascular: No chest pain, palpitations Respiratory:  No shortness of breath at rest or with exertion.   No wheezes GastrointestinaI: No nausea, vomiting, diarrhea, abdominal pain, fecal  incontinence Genitourinary:  No dysuria, urinary retention or frequency.  No nocturia. Musculoskeletal: Pain all over in muscles, joints and spine Integumentary: No rash, pruritus, skin lesions Neurological: as above Psychiatric: She has depression and anxiety Endocrine: No palpitations, diaphoresis, change in appetite, change in weigh or increased thirst Hematologic/Lymphatic:  No anemia, purpura, petechiae. Allergic/Immunologic: No itchy/runny eyes, nasal congestion, recent allergic reactions, rashes  ALLERGIES: No Known Allergies  HOME MEDICATIONS:  Current Outpatient Medications:    Accu-Chek Softclix Lancets lancets, Use as directed up to 4 times daily, Disp: 100 each, Rfl: 5   amitriptyline (ELAVIL) 25 MG tablet, Take 1 tablet (25 mg total) by mouth at bedtime., Disp: 30 tablet, Rfl: 5   amLODipine (NORVASC) 5 MG tablet, Take 1 tablet (5 mg total) by mouth daily., Disp: 30 tablet, Rfl: 2   ANTI-DIARRHEAL 2 MG tablet, Take 2 mg by mouth as needed for diarrhea or loose stools. , Disp: , Rfl:    atorvastatin (LIPITOR) 80 MG tablet, Take 1 tablet (80 mg total) by mouth daily., Disp: 90 tablet, Rfl: 3   baclofen (LIORESAL) 20 MG tablet, Take 20 mg by mouth 2 (two) times daily. , Disp: , Rfl:    Biotin w/ Vitamins C & E (HAIR/SKIN/NAILS PO), Take 1 tablet by mouth daily., Disp: , Rfl:    Blood Glucose Monitoring Suppl (BLOOD GLUCOSE MONITOR SYSTEM) w/Device KIT, use as directed, Disp: 1 kit, Rfl: 0   busPIRone (BUSPAR) 15 MG tablet, Take 15 mg by mouth 3 (three) times daily., Disp: , Rfl:    dalfampridine 10 MG TB12, One po q12 hours, Disp: 60 tablet, Rfl: 5   dicyclomine (BENTYL) 20 MG tablet, Take 20 mg by mouth in the morning and at bedtime., Disp: , Rfl:    diphenhydramine-acetaminophen (TYLENOL PM) 25-500 MG TABS tablet, Take 1 tablet by mouth at bedtime as needed (pain/sleep)., Disp: , Rfl:    diphenoxylate-atropine (LOMOTIL) 2.5-0.025 MG per tablet, Take 1 tablet by mouth 2 (two)  times daily. PATIENT NEEDS OFFICE VISIT FOR ADDITIONAL REFILLS, Disp: 60 tablet, Rfl: 0   DULoxetine (CYMBALTA) 60 MG capsule, Take 60 mg by mouth 2 (two) times daily. , Disp: , Rfl:    Echinacea 400 MG CAPS, Take 400 mg by mouth in the morning and at bedtime., Disp: , Rfl:    fluticasone (FLONASE) 50 MCG/ACT nasal spray, Place 2 sprays into the nose daily., Disp: 16 g, Rfl: 11   glucose blood (ACCU-CHEK GUIDE) test strip, Use as instructed up to 4 times daily, Disp: 100 each, Rfl: 12   Homeopathic Products (LEG CRAMPS PO), Take 1-2 tablets by mouth daily as needed (For leg cramps)., Disp: ,  Rfl:    ibuprofen (ADVIL) 800 MG tablet, Take 800 mg by mouth 3 (three) times daily as needed for moderate pain. , Disp: , Rfl:    insulin glargine (LANTUS) 100 UNIT/ML Solostar Pen, Inject 40 Units into the skin daily., Disp: 15 mL, Rfl: 2   insulin lispro (HUMALOG) 100 UNIT/ML KwikPen, Inject 10 Units into the skin 3 (three) times daily with meals., Disp: 15 mL, Rfl: 2   Insulin Pen Needle 32G X 4 MM MISC, use as directed 4 times daily, Disp: 200 each, Rfl: 2   lamoTRIgine (LAMICTAL) 200 MG tablet, One po twicw daily, Disp: 60 tablet, Rfl: 11   losartan (COZAAR) 100 MG tablet, Take 1 tablet (100 mg total) by mouth daily., Disp: 90 tablet, Rfl: 3   MAGNESIUM-OXIDE 400 (241.3 Mg) MG tablet, Take 1 tablet by mouth daily., Disp: , Rfl:    MELATONIN MAXIMUM STRENGTH 5 MG TABS, Take 15 mg by mouth at bedtime., Disp: , Rfl:    montelukast (SINGULAIR) 10 MG tablet, Take 10 mg by mouth at bedtime., Disp: , Rfl:    MUCUS RELIEF 400 MG TABS tablet, Take 400 mg by mouth every 6 (six) hours as needed (For mucus)., Disp: , Rfl:    NARCAN 4 MG/0.1ML LIQD nasal spray kit, Place 1 spray into the nose as directed. overdose, Disp: , Rfl:    oxyCODONE (ROXICODONE) 15 MG immediate release tablet, Take 15 mg by mouth every 6 (six) hours as needed for pain., Disp: , Rfl:    pantoprazole (PROTONIX) 40 MG tablet, Take 40 mg by mouth  2 (two) times daily., Disp: , Rfl:    TECFIDERA 240 MG CPDR, Take 1 capsule by mouth 2 (two) times daily., Disp: , Rfl:    tiZANidine (ZANAFLEX) 2 MG tablet, Take 2 mg by mouth 4 (four) times daily. Patient takes up to four times daily, Disp: , Rfl:    VIBERZI 100 MG TABS, Take 1 tablet by mouth 2 (two) times daily., Disp: , Rfl:    vitamin B-12 (CYANOCOBALAMIN) 1000 MCG tablet, Take 1,000 mcg by mouth daily. , Disp: , Rfl:    VITAMIN D PO, Take 1 tablet by mouth daily., Disp: , Rfl:    XIIDRA 5 % SOLN, Place 1 drop into both eyes in the morning and at bedtime. , Disp: , Rfl:    zolpidem (AMBIEN) 10 MG tablet, Take 10 mg by mouth at bedtime. , Disp: , Rfl:   PAST MEDICAL HISTORY: Past Medical History:  Diagnosis Date   Diabetes mellitus    Hypertension    Multiple sclerosis exacerbation ([place])    Neuropathy     PAST SURGICAL HISTORY: Past Surgical History:  Procedure Laterality Date   ABDOMINAL HYSTERECTOMY     partial   I & D EXTREMITY Right 12/14/2020   Procedure: IRRIGATION AND DEBRIDEMENT RIGHT LOWER EXTREMITY;  Surgeon: Rod Can, MD;  Location: [place];  Service: Orthopedics;  Laterality: Right;   JOINT REPLACEMENT     LTK   TUBAL LIGATION      FAMILY HISTORY: Family History  Problem Relation Age of Onset   Diabetes Mother    Hypertension Mother    Stroke Father     SOCIAL HISTORY:  Social History   Socioeconomic History   Marital status: Single    Spouse name: Not on file   Number of children: 2   Years of education: Not on file   Highest education level: Not on file  Occupational History  Not on file  Tobacco Use   Smoking status: Every Day    Packs/day: 0.25    Years: 12.00    Pack years: 3.00    Types: Cigarettes   Smokeless tobacco: Never   Tobacco comments:    06/13/20 1 pk per 3 days  Substance and Sexual Activity   Alcohol use: No   Drug use: No   Sexual activity: Not on file  Other Topics Concern   Not on file  Social History Narrative    06/13/20 lives alone, dgtr across the street   Social Determinants of Health   Financial Resource Strain: Not on file  Food Insecurity: Not on file  Transportation Needs: Not on file  Physical Activity: Not on file  Stress: Not on file  Social Connections: Not on file  Intimate Partner Violence: Not on file     PHYSICAL EXAM  Vitals:   01/14/21 0935  BP: (!) 182/89  Pulse: 91  Weight: 271 lb (122.9 kg)  Height: 5' 3"  (1.6 m)    Body mass index is 48.01 kg/m.   General: The patient is well-developed and well-nourished and in no acute distress  HEENT:  Head is [place]/AT.  Sclera are anicteric.     Skin: Extremities are without rash.  She has some edema at the ankles.  Musculoskeletal: She reports tenderness over the paraspinal muscles of the neck and lower back and myalgias at typical fibromyalgia tender points.  Neurologic Exam  Mental status: The patient is alert and oriented x 3 at the time of the examination. The patient has apparent normal recent and remote memory, with an apparently normal attention span and concentration ability.   Speech is normal.  Cranial nerves: Extraocular movements are full.  Facial strength and sensation was normal.  No obvious hearing deficits are noted.  Motor:  Muscle bulk is normal.   Tone is normal. Strength is  5 / 5 in all 4 extremities except 4+/5 ankles,4/5 toes, slightly worse on left. .   Sensory: She reported reduced sensation to touch and temperature in the left arm and leg.  Vibration sensation was more symmetric. Also, decreased sensation in toes and ankles to vibration compared to knees  Coordination: Cerebellar testing reveals good finger-nose-finger and heel-to-shin bilaterally.  Gait and station: Station is normal.   Gait is arthritic and wide wih reduced stride. Tandem gait is wide. Romberg is negative.   Reflexes: Deep tendon reflexes are 1 and symmetric in the limbs.     25 foot walking = 17.0 seconds (average of 2  trials)    DIAGNOSTIC DATA (LABS, IMAGING, TESTING) - I reviewed patient records, labs, notes, testing and imaging myself where available.  Lab Results  Component Value Date   WBC 7.4 12/21/2020   HGB 13.4 12/21/2020   HCT 40.9 12/21/2020   MCV 91.3 12/21/2020   PLT 347 12/21/2020      Component Value Date/Time   NA 142 12/21/2020 1433   K 3.5 12/21/2020 1433   CL 106 12/21/2020 1433   CO2 27 12/21/2020 1433   GLUCOSE 98 12/21/2020 1433   BUN 9 12/21/2020 1433   CREATININE 0.59 12/21/2020 1433   CREATININE 0.85 02/24/2012 1044   CALCIUM 9.5 12/21/2020 1433   PROT 6.9 12/21/2020 1433   ALBUMIN 3.6 12/21/2020 1433   AST 18 12/21/2020 1433   ALT 23 12/21/2020 1433   ALKPHOS 69 12/21/2020 1433   BILITOT 0.2 (L) 12/21/2020 1433   GFRNONAA >60 12/21/2020 1433  GFRAA >60 11/17/2019 1242   Lab Results  Component Value Date   CHOL (H) 02/19/2009    237        ATP III CLASSIFICATION:  <200     mg/dL   Desirable  200-239  mg/dL   Borderline High  >=240    mg/dL   High          HDL 52 02/19/2009   LDLCALC (H) 02/19/2009    135        Total Cholesterol/HDL:CHD Risk Coronary Heart Disease Risk Table                     Men   Women  1/2 Average Risk   3.4   3.3  Average Risk       5.0   4.4  2 X Average Risk   9.6   7.1  3 X Average Risk  23.4   11.0        Use the calculated Patient Ratio above and the CHD Risk Table to determine the patient's CHD Risk.        ATP III CLASSIFICATION (LDL):  <100     mg/dL   Optimal  100-129  mg/dL   Near or Above                    Optimal  130-159  mg/dL   Borderline  160-189  mg/dL   High  >190     mg/dL   Very High   TRIG 249 (H) 02/19/2009   CHOLHDL 4.6 02/19/2009   Lab Results  Component Value Date   HGBA1C 10.6 (H) 12/10/2020   Lab Results  Component Value Date   VITAMINB12 711 11/06/2008   Lab Results  Component Value Date   TSH 1.973 12/10/2020       ASSESSMENT AND PLAN  Multiple sclerosis  (HCC)  High risk medication use  Pain in both lower extremities  Gait disturbance  Lumbar spondylosis  Diabetic peripheral neuropathy ([place])   1.    Continue Tecfidera.  Dalfampridine for gait .  Kidney function is fine.  She is advised to stop if she notes no benefit.   She did not feel PT helped in past.    2.    Toradol 60 mg IM x 1 for myalgias and leg pain.   She will continue to see pain management for medications and procedures.  3.    Encouraged weight loss.  She tries to be active and should try to increase his activity further.   4.    Return in 6 months or sooner if there are new or worsening neurologic symptoms.   Alis Sawchuk A. Felecia Shelling, MD, [facility] 06/07/9756, 83:25 AM Certified in Neurology, Clinical Neurophysiology, Sleep Medicine and Neuroimaging  [facility] [address] [place], [place] [address] [phone]

## 2021-01-15 ENCOUNTER — Other Ambulatory Visit: Payer: Self-pay

## 2021-01-15 ENCOUNTER — Telehealth: Payer: Self-pay | Admitting: Neurology

## 2021-01-15 ENCOUNTER — Ambulatory Visit (INDEPENDENT_AMBULATORY_CARE_PROVIDER_SITE_OTHER): Payer: Medicare Other | Admitting: Podiatry

## 2021-01-15 DIAGNOSIS — M21619 Bunion of unspecified foot: Secondary | ICD-10-CM | POA: Diagnosis not present

## 2021-01-15 DIAGNOSIS — M79675 Pain in left toe(s): Secondary | ICD-10-CM | POA: Diagnosis not present

## 2021-01-15 DIAGNOSIS — L84 Corns and callosities: Secondary | ICD-10-CM

## 2021-01-15 DIAGNOSIS — M79674 Pain in right toe(s): Secondary | ICD-10-CM | POA: Diagnosis not present

## 2021-01-15 DIAGNOSIS — E1149 Type 2 diabetes mellitus with other diabetic neurological complication: Secondary | ICD-10-CM

## 2021-01-15 DIAGNOSIS — B351 Tinea unguium: Secondary | ICD-10-CM

## 2021-01-15 DIAGNOSIS — E1140 Type 2 diabetes mellitus with diabetic neuropathy, unspecified: Secondary | ICD-10-CM | POA: Diagnosis not present

## 2021-01-15 NOTE — Telephone Encounter (Signed)
Pt is asking for a call back with the name of the medication that Dr Epimenio Foot asked she hold off on for a while, please call.

## 2021-01-15 NOTE — Progress Notes (Signed)
Subjective:   Patient ID: [name], female   DOB: 60 y.o.   MRN: [id number]   HPI Patient presents stating she has nail disease of all her nails that she cannot take care of and has chronic lesions underneath her right arch and had had previous surgery and now has diabetes not in good control obesity and does smoke quarter pack per day.  She is not active   Review of Systems  All other systems reviewed and are negative.      Objective:  Physical Exam Vitals and nursing note reviewed.  Constitutional:      Appearance: She is well-developed.  Pulmonary:     Effort: Pulmonary effort is normal.  Musculoskeletal:        General: Normal range of motion.  Skin:    General: Skin is warm.  Neurological:     Mental Status: She is alert.    Neurovascular status found to be intact with diminishment of sharp dull vibratory moderate nature bilateral.  Patient is noted to have thickened nailbeds 1-5 both feet thick and incurvated and get sore and make it hard to wear shoe gear comfortably and has keratotic lesions x3 within the plantar arch right that are painful when pressed and sometimes hard to walk on.  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  She also talked about a bunion on the right but I do not recommend thinking about this until her sugars under better control and we are getting at this point consider conservative treatment.  Debridement of nailbeds accomplished debridement of lesions right accomplished no iatrogenic bleeding reappoint routine care as needed and the possibility for follow-up x-rays to consider structural correction if she is able to get in better control of her diabetes but at this point not recommending that type of treatment

## 2021-01-16 NOTE — Telephone Encounter (Signed)
LVM for pt relaying Dr. Bonnita Hollow message. Advised her to call back if she has any more questions/concerns.

## 2021-01-22 NOTE — Telephone Encounter (Signed)
Patient sedated MRI;s are scheduled at [facility] for 02/18/21.  Placed H&P form to be filled out in Dr. Epimenio Foot box.

## 2021-01-30 ENCOUNTER — Telehealth: Payer: Self-pay | Admitting: Neurology

## 2021-01-30 NOTE — Telephone Encounter (Signed)
Patient left a voicemail on my phone stating she has not heard about scheduling her MRI's I called her back and informed her that she is scheduled at [facility] for 02/18/21 and its sedated. She stated she is aware of that appointment..   She also wanted Dr. Epimenio Foot aware that her insurance will not cover the life style meter and that she has fallen 4 times this month.

## 2021-02-03 ENCOUNTER — Other Ambulatory Visit: Payer: Self-pay | Admitting: *Deleted

## 2021-02-03 DIAGNOSIS — R269 Unspecified abnormalities of gait and mobility: Secondary | ICD-10-CM

## 2021-02-03 DIAGNOSIS — G35 Multiple sclerosis: Secondary | ICD-10-CM

## 2021-02-03 MED ORDER — DALFAMPRIDINE ER 10 MG PO TB12: ORAL_TABLET | ORAL | 5 refills | Status: DC

## 2021-02-03 NOTE — Telephone Encounter (Signed)
Pt called back. Did not have dalfampridine rx at [facility]. Advised rx needs to be sent to specialty pharmacy actually. She would like it to go to [facility] specialty ([facility]). I e-scribed rx. She will follow up with them to process refill.

## 2021-02-03 NOTE — Telephone Encounter (Addendum)
Called pt back. Confirmed she stopped baclofen d/t being ineffective. She went through meds while on the phone. She is not taking dalfampridine as prescribed. She will call pharmacy to get refill. Aware Dr. Epimenio Foot wants to see what upcoming MRI shows to determine next steps. Aware we will call once results available.

## 2021-02-13 ENCOUNTER — Encounter: Payer: Medicare Other | Admitting: Vascular Surgery

## 2021-02-13 ENCOUNTER — Other Ambulatory Visit: Payer: Self-pay

## 2021-02-13 ENCOUNTER — Encounter (HOSPITAL_COMMUNITY): Payer: Medicare Other

## 2021-02-13 DIAGNOSIS — M7989 Other specified soft tissue disorders: Secondary | ICD-10-CM

## 2021-02-15 ENCOUNTER — Encounter (HOSPITAL_COMMUNITY): Payer: Self-pay

## 2021-02-15 NOTE — Pre-Procedure Instructions (Signed)
PCP - Dr. Patsy Lager Cardiologist - Dr. Shirlee Latch Neurology - Dr. Epimenio Foot Pulmonary - Dr. Benjamine Mola  EKG - 12/10/20 Chest x-ray - 12/10/20 ECHO - n/a Cardiac Cath - n/a CPAP - n/a  Fasting Blood Sugar:  90-100 Checks Blood Sugar:  3/day   ERAS Protcol - clear until 3hr prior  COVID TEST- n/a  Anesthesia review: Yes  -------------  SDW INSTRUCTIONS:  Your procedure is scheduled on 02/18/21. Please report to [facility] Main Entrance "A" at 07:30 A.M., and check in at the Admitting office. Call this number if you have problems the morning of surgery: [phone]  Remember: Do not eat after midnight the night before your surgery  You may drink clear liquids until 07:00 the morning of your surgery.   Clear liquids allowed are: Water, Non-Citrus Juices (without pulp), Carbonated Beverages, Clear Tea, Black Coffee Only, and Gatorade   Medications to take morning of surgery with a sip of water include: amLODipine (NORVASC) atorvastatin (LIPITOR) baclofen (LIORESAL) busPIRone (BUSPAR) lamoTRIgine (LAMICTAL) dicyclomine (BENTYL) DULoxetine (CYMBALTA)  pantoprazole (PROTONIX) tiZANidine (ZANAFLEX) VIBERZI XIIDRA 5 % SOLN diphenoxylate-atropine   As of today, STOP taking any Aspirin (unless otherwise instructed by your surgeon), Aleve, Naproxen, Ibuprofen, Motrin, Advil, Goody's, BC's, all herbal medications, fish oil, and all vitamins.  WHAT DO I DO ABOUT MY DIABETES MEDICATION?     THE MORNING OF SURGERY, take ____20_________ units of _______Lantus___insulin.  If your CBG is greater than 220 mg/dL, you may take  of your sliding scale (correction) dose of insulin.   HOW TO MANAGE YOUR DIABETES BEFORE AND AFTER SURGERY  Why is it important to control my blood sugar before and after surgery? Improving blood sugar levels before and after surgery helps healing and can limit problems. A way of improving blood sugar control is eating a healthy diet by:  Eating less sugar and  carbohydrates  Increasing activity/exercise  Talking with your doctor about reaching your blood sugar goals High blood sugars (greater than 180 mg/dL) can raise your risk of infections and slow your recovery, so you will need to focus on controlling your diabetes during the weeks before surgery. Make sure that the doctor who takes care of your diabetes knows about your planned surgery including the date and location.  How do I manage my blood sugar before surgery? Check your blood sugar at least 4 times a day, starting 2 days before surgery, to make sure that the level is not too high or low.  Check your blood sugar the morning of your surgery when you wake up and every 2 hours until you get to the Short Stay unit.  If your blood sugar is less than 70 mg/dL, you will need to treat for low blood sugar: Do not take insulin. Treat a low blood sugar (less than 70 mg/dL) with  cup of clear juice (cranberry or apple), 4 glucose tablets, OR glucose gel. Recheck blood sugar in 15 minutes after treatment (to make sure it is greater than 70 mg/dL). If your blood sugar is not greater than 70 mg/dL on recheck, call [phone] for further instructions. Report your blood sugar to the short stay nurse when you get to Short Stay.  If you are admitted to the hospital after surgery: Your blood sugar will be checked by the staff and you will probably be given insulin after surgery (instead of oral diabetes medicines) to make sure you have good blood sugar levels. The goal for blood sugar control after surgery is 80-180 mg/dL.  The Morning of Surgery Do not wear jewelry, make-up or nail polish. Do not wear lotions, powders, or perfumes/colognes, or deodorant Do not shave 48 hours prior to surgery.   Men may shave face and neck. Do not bring valuables to the hospital. [facility] is not responsible for any belongings or valuables.  If you are a smoker, DO NOT Smoke 24 hours prior to surgery  If you  wear a CPAP at night please bring your mask the morning of surgery   Remember that you must have someone to transport you home after your surgery, and remain with you for 24 hours if you are discharged the same day.  Please bring cases for contacts, glasses, hearing aids, dentures or bridgework because it cannot be worn into surgery.   Patients discharged the day of surgery will not be allowed to drive home.   Please shower the NIGHT BEFORE/MORNING OF SURGERY (use antibacterial soap like DIAL soap if possible). Wear comfortable clothes the morning of surgery. Oral Hygiene is also important to reduce your risk of infection.  Remember - BRUSH YOUR TEETH THE MORNING OF SURGERY WITH YOUR REGULAR TOOTHPASTE  Patient denies shortness of breath, fever, cough and chest pain.

## 2021-02-17 ENCOUNTER — Telehealth (INDEPENDENT_AMBULATORY_CARE_PROVIDER_SITE_OTHER): Payer: Medicare Other | Admitting: Neurology

## 2021-02-17 ENCOUNTER — Encounter: Payer: Self-pay | Admitting: Neurology

## 2021-02-17 ENCOUNTER — Telehealth: Payer: Self-pay | Admitting: *Deleted

## 2021-02-17 DIAGNOSIS — E1142 Type 2 diabetes mellitus with diabetic polyneuropathy: Secondary | ICD-10-CM | POA: Diagnosis not present

## 2021-02-17 DIAGNOSIS — R269 Unspecified abnormalities of gait and mobility: Secondary | ICD-10-CM

## 2021-02-17 DIAGNOSIS — G35 Multiple sclerosis: Secondary | ICD-10-CM | POA: Diagnosis not present

## 2021-02-17 DIAGNOSIS — Z79899 Other long term (current) drug therapy: Secondary | ICD-10-CM | POA: Diagnosis not present

## 2021-02-17 NOTE — Progress Notes (Signed)
Anesthesia Chart Review: [name]  Case: [id number] Date/Time: 02/18/21 0945   Procedure: MRI WITH ANESTHESIA CERVICAL SPINE WITH AND WITHOUT CONTRAST AND BRAIN WITH AND WITHOUT CONTRAST   Anesthesia type: General   Pre-op diagnosis: MS   Location: [facility] OR RADIOLOGY ROOM / [place] OR   Surgeons: Radiologist, Medication, MD       DISCUSSION: Patient is a 60 year old female scheduled for the above procedure.  MRI was ordered by her neurologist Dr. Felecia Shelling.  She has MS.  At 01/14/2021 visit she was noting more MS symptoms.  She was especially feeling weaker with some falls.  One fall was associated with glucose > 500 requiring admission.  She is on Tecfidera. 2 days IV solu-medrol given for increased symptoms. She was also given Toradol IM for leg pain and myalgias and started on dalfamprdine for gait (can discontinue if no benefit). MRI of the brain and C-spine ordered.   H&P form initially signed on 01/22/21 citing 01/14/21 exam, but Dr. Felecia Shelling able to update H&P and exam on 02/17/21 video visit. She also recently had an in-person visit with Lenell Antu, MD 02/10/21 at the [facility]. Her BLE wounds were nearly healed. (S/p excisional debridement kin, subcutaneous tissue RLE abscess 12/14/20.)  History includes smoking, multiple sclerosis (diagnosed in 2008), DM2, neuropathy, HTN, morbid obesity.    [facility] admission 12/10/2020 - 12/18/2020 for confusion, acute toxic/metabolic encephalopathy, hyperosmolar hyperglycemic syndrome with A1c 10.6%, and right leg abscess.  She required IV insulin drip and was discharged on Lantus and also NovoLog for meal coverage. (More recently since discharge, she reported home CBGs ~ 90-100.) For infection she was started on IV antibiotics and underwent I&D on 12/14/2020.   Her last las available are > 35 days old, so labs as indicated and anesthesia team evaluation on the day of surgery.   VS: Ht 5' 3"  (1.6 m)   Wt 120.2 kg   BMI 46.94 kg/m  BP Readings from Last 3  Encounters:  01/14/21 (!) 182/89  12/21/20 (!) 176/112  12/18/20 136/77   Pulse Readings from Last 3 Encounters:  01/14/21 91  12/21/20 89  12/18/20 99     PROVIDERS: Beverley Fiedler, FNP is listed as PCP. PAT RN documented as Dr.. Lorelei Pont. - Felecia Shelling, Delfino Lovett, MD is neurologist - Last cardiology visit with Loralie Champagne, MD 07/16/10. Had a preoperative evaluation for dyspnea and HTN. Testing done (see below). As needed follow-up recommended. - She is seen at [facility] for pain management.    LABS: Labs on day of procedure as indicated. Last lab results include: Lab Results  Component Value Date   WBC 7.4 12/21/2020   HGB 13.4 12/21/2020   HCT 40.9 12/21/2020   PLT 347 12/21/2020   GLUCOSE 98 12/21/2020   ALT 23 12/21/2020   AST 18 12/21/2020   NA 142 12/21/2020   K 3.5 12/21/2020   CL 106 12/21/2020   CREATININE 0.59 12/21/2020   BUN 9 12/21/2020   CO2 27 12/21/2020   TSH 1.973 12/10/2020   HGBA1C 10.6 (H) 12/10/2020    IMAGES: CT C-spine 12/10/20: IMPRESSION: 1. No acute traumatic injury identified in the cervical spine. 2. 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   CT Head 12/10/20: IMPRESSION: Negative for age non contrast CT appearance of the brain. No acute traumatic injury identified.  1V PCXR 12/10/20: FINDINGS:  Cardiomegaly with mild bilateral interstitial prominence and tiny bilateral pleural effusions. Findings suggest CHF. Pneumonitis cannot be excluded. No acute bony abnormality. IMPRESSION: Findings suggesting mild CHF.   EKG:12/10/20: ST at 106 bpm   CV: Nuclear stress test 06/16/10: ECG Impression: No significant ST segment change suggestive of ischemia. Overall Impression: Normal stress nuclear study. Appended Document: Cardiology Nuclear Testing probably  normal study   48 hour Holter monitor 06/13/10: Occasional PVCs, otherwise okay   Echo 01/12/08: SUMMARY   -  Overall left ventricular systolic function was normal. Left         ventricular ejection fraction was estimated , range being 60         % to 65 %. There were no left ventricular regional wall         motion abnormalities. Left ventricular wall thickness was         mildly increased.   -  The left atrium was mildly dilated.    Past Medical History:  Diagnosis Date   Diabetes mellitus    Hypertension    Multiple sclerosis exacerbation ([place])    Neuropathy     Past Surgical History:  Procedure Laterality Date   ABDOMINAL HYSTERECTOMY     partial   I & D EXTREMITY Right 12/14/2020   Procedure: IRRIGATION AND DEBRIDEMENT RIGHT LOWER EXTREMITY;  Surgeon: Rod Can, MD;  Location: [place];  Service: Orthopedics;  Laterality: Right;   JOINT REPLACEMENT     LTK   TUBAL LIGATION      MEDICATIONS: No current facility-administered medications for this encounter.    amitriptyline (ELAVIL) 25 MG tablet   amLODipine (NORVASC) 5 MG tablet   ANTI-DIARRHEAL 2 MG tablet   atorvastatin (LIPITOR) 80 MG tablet   busPIRone (BUSPAR) 15 MG tablet   Cholecalciferol (VITAMIN D-3) 125 MCG (5000 UT) TABS   dalfampridine 10 MG TB12   dicyclomine (BENTYL) 20 MG tablet   diphenoxylate-atropine (LOMOTIL) 2.5-0.025 MG tablet   DULoxetine (CYMBALTA) 60 MG capsule   fluticasone (FLONASE) 50 MCG/ACT nasal spray   Homeopathic Products (LEG CRAMPS PO)   ibuprofen (ADVIL) 800 MG tablet   insulin glargine (LANTUS) 100 UNIT/ML Solostar Pen   insulin lispro (HUMALOG) 100 UNIT/ML KwikPen   lamoTRIgine (LAMICTAL) 200 MG tablet   losartan (COZAAR) 100 MG tablet   MAGNESIUM-OXIDE 400 (241.3 Mg) MG tablet   MELATONIN MAXIMUM STRENGTH 5 MG TABS   montelukast (SINGULAIR) 10 MG tablet   NARCAN 4 MG/0.1ML LIQD nasal spray kit   oxyCODONE (ROXICODONE) 15 MG immediate release tablet   TECFIDERA 240 MG  CPDR   tiZANidine (ZANAFLEX) 2 MG tablet   triamterene-hydrochlorothiazide (MAXZIDE-25) 37.5-25 MG tablet   VIBERZI 100 MG TABS   vitamin B-12 (CYANOCOBALAMIN) 1000 MCG tablet   XIIDRA 5 % SOLN   zolpidem (AMBIEN) 10 MG tablet   Accu-Chek Softclix Lancets lancets   Biotin w/ Vitamins C & E (HAIR/SKIN/NAILS PO)   Blood Glucose Monitoring Suppl (BLOOD GLUCOSE MONITOR SYSTEM) w/Device KIT   glucose blood (ACCU-CHEK GUIDE) test strip   Insulin Pen Needle 32G X 4 MM MISC    Myra Gianotti, PA-C Surgical Short Stay/Anesthesiology [facility] Phone [phone] [facility] Phone [phone] 02/17/2021 8:28 PM

## 2021-02-17 NOTE — Anesthesia Preprocedure Evaluation (Addendum)
Anesthesia Evaluation  Patient identified by MRN, date of birth, ID band Patient awake    Reviewed: Allergy & Precautions, NPO status , Patient's Chart, lab work & pertinent test results  Airway Mallampati: III  TM Distance: >3 FB Neck ROM: Full    Dental  (+) Dental Advisory Given   Pulmonary Current Smoker,    breath sounds clear to auscultation       Cardiovascular hypertension, Pt. on medications  Rhythm:Regular Rate:Normal     Neuro/Psych  Neuromuscular disease (MS)    GI/Hepatic Neg liver ROS, GERD  ,  Endo/Other  diabetes, Type 2, Oral Hypoglycemic Agents, Insulin DependentMorbid obesity  Renal/GU negative Renal ROS     Musculoskeletal  (+) Arthritis ,   Abdominal   Peds  Hematology negative hematology ROS (+)   Anesthesia Other Findings   Reproductive/Obstetrics                            Lab Results  Component Value Date   WBC 7.4 12/21/2020   HGB 13.4 12/21/2020   HCT 40.9 12/21/2020   MCV 91.3 12/21/2020   PLT 347 12/21/2020   Lab Results  Component Value Date   CREATININE 0.59 12/21/2020   BUN 9 12/21/2020   NA 142 12/21/2020   K 3.5 12/21/2020   CL 106 12/21/2020   CO2 27 12/21/2020    Anesthesia Physical Anesthesia Plan  ASA: 3  Anesthesia Plan: General   Post-op Pain Management:    Induction: Intravenous  PONV Risk Score and Plan: 2 and Treatment may vary due to age or medical condition, Dexamethasone and Ondansetron  Airway Management Planned: Oral ETT  Additional Equipment: None  Intra-op Plan:   Post-operative Plan: Extubation in OR  Informed Consent: I have reviewed the patients History and Physical, chart, labs and discussed the procedure including the risks, benefits and alternatives for the proposed anesthesia with the patient or authorized representative who has indicated his/her understanding and acceptance.     Dental advisory  given  Plan Discussed with: CRNA  Anesthesia Plan Comments: ( )      Anesthesia Quick Evaluation

## 2021-02-17 NOTE — Progress Notes (Signed)
[facility]  PATIENT: [name] DOB: 1961/02/10  REFERRING DOCTOR OR PCP: Lucky Cowboy, FNP SOURCE: Patient, notes from primary care, imaging and lab reports, MRI images personally reviewed.  _________________________________   HISTORICAL  CHIEF COMPLAINT:  Chief Complaint  Patient presents with   Multiple Sclerosis     HISTORY OF PRESENT ILLNESS:  [name] is a 60 y.o. woman with multiple sclerosis, LBP and gait disturbance  Update 02/17/2021: Virtual Visit via Video Note I connected with [name] 02/17/21 at  4:30 PM EDT by a video enabled telemedicine application and verified that I am speaking with the correct person.  I discussed the limitations of evaluation and management by telemedicine and the availability of in person appointments. The patient expressed understanding and agreed to proceed.  Patient at home and provider in office  History of Present Illness: She is noting more MS symptoms.    She has felt weaker and has had more stumbles and falls.  She hurt herself with a 1 fall but has completely recovered.  Of note, one of the falls was associated with very high blood sugar.  At other fall was in the shower.  Recently her diabetic medications were changed.  She is now  on insulin (long lasting and regular SS)  For MS, she is on Tecficdera and tolerates it well.   She feels the left leg has slowly worsened and is weaker.  This affects her gait.  She came in a couple weeks ago when symptoms started.  I had her do 2 days of IV Solu-Medrol.  She has been using a cane to walk due to the forwards and reduced gait.  She also has muscle fatigue when she exerts herself more.  She is waiting to get her electric wheelchair.   She stumbles and her legs sometimes give out.     She has a burning dysesthetic pain in her arms and legs.   This is stable.  She is on lamotrigine as well for pain.   Gabapentin had not helped and she also gained weight.   She is having some difficulty doing activities of daily living.  Her vision is doing about the same.  She has urinary urgency and frequency.  She also has LBP and sees Dr. Christoper Fabian for pain management and is on Nucynta and oxycodone 15 mg q6  She had MBBB/RFA helped the right side but not the left side.  Surgery has been discussed with her.   She is also on tizanidine, baclofen and amitriptyline to help her pain.   She sees Dr. Maia Petties.     She has never had seizures.   Kidney function is normal (creatinine = 0.59)  She has sleep maintenance more than sleep onset insomnia.  This is worse since she ran out of her Ambien.  Even with her pain medications and Ambien, sleep is poor (4-6 hours many nights -- falls asleep well but then wakes up).    She sees [facility] for Pain Management.   She is on oxycodone, Nucynta and zolpidem.  She had a sleep study in 2013 or 2014.  It did not show sleep apnea.  Observations/Objective: She is a well-developed well-nourished woman in no acute distress.  The head is normocephalic and atraumatic.  Sclera are anicteric.  Visible skin appears normal.  The neck has a good range of motion.  Pharynx and tongue have normal appearance.  She is alert and fully oriented with fluent  speech and good attention, knowledge and memory.  Extraocular muscles are intact.  Facial strength is normal.  Palatal elevation and tongue protrusion are midline.  She appears to have normal strength in the arms.  Rapid alternating movements and finger-nose-finger are performed well.   MS HISTORY She was diagnosed with MS in 2008 after presenting with leg weakness and headache, neck pain, back pain.  MRIs showed foci in her cervical spine and brain.   She was started on Rebif.   Initially she did well but she reports having a lot of relapses.   She was switched to Copaxone but also had more symptoms.   She was then switched to Tysabri for several years but reports having several  relapses.   She switched to Tecfidera 5 years ago.   She reports having a flare in 2015 and had 5 days of IV Solu-medrol.   July 2021, she was complaining of blurry vision, stiff neck and increased burning and tingling a few weeks ago.  She went to the ED and had MRIs performed showing no new lesions.    IMAGING: MRI of the cervical spine 11/18/2019 and 03/02/2012 showed two T2 hyperintense foci adjacent to C2 and C3-C4.  There was mild multilevel degenerative changes as well.    MRI of the brain 11/18/2019 and 03/02/2020 show a fairly low plaque burden of T2/FLAIR hyperintense foci.  Most of them are nonspecific.  A few are periventricular.  None acute and no change over time.  REVIEW OF SYSTEMS: Constitutional: No fevers, chills, sweats, or change in appetite.  She has fatigue and she reports insomnia Eyes: No visual changes, double vision, eye pain Ear, nose and throat: No hearing loss, ear pain, nasal congestion, sore throat Cardiovascular: No chest pain, palpitations Respiratory:  No shortness of breath at rest or with exertion.   No wheezes GastrointestinaI: No nausea, vomiting, diarrhea, abdominal pain, fecal incontinence Genitourinary:  No dysuria, urinary retention or frequency.  No nocturia. Musculoskeletal: Pain all over in muscles, joints and spine Integumentary: No rash, pruritus, skin lesions Neurological: as above Psychiatric: She has depression and anxiety Endocrine: No palpitations, diaphoresis, change in appetite, change in weigh or increased thirst Hematologic/Lymphatic:  No anemia, purpura, petechiae. Allergic/Immunologic: No itchy/runny eyes, nasal congestion, recent allergic reactions, rashes  ALLERGIES: No Known Allergies  HOME MEDICATIONS:  Current Outpatient Medications:    Accu-Chek Softclix Lancets lancets, Use as directed up to 4 times daily, Disp: 100 each, Rfl: 5   amitriptyline (ELAVIL) 25 MG tablet, Take 1 tablet (25 mg total) by mouth at bedtime., Disp: 30  tablet, Rfl: 5   amLODipine (NORVASC) 5 MG tablet, Take 1 tablet (5 mg total) by mouth daily. (Patient taking differently: Take 5 mg by mouth every evening.), Disp: 30 tablet, Rfl: 2   ANTI-DIARRHEAL 2 MG tablet, Take 2-4 mg by mouth 4 (four) times daily as needed for diarrhea or loose stools., Disp: , Rfl:    atorvastatin (LIPITOR) 80 MG tablet, Take 1 tablet (80 mg total) by mouth daily. (Patient taking differently: Take 80 mg by mouth every evening.), Disp: 90 tablet, Rfl: 3   Biotin w/ Vitamins C & E (HAIR/SKIN/NAILS PO), Take 3 tablets by mouth in the morning and at bedtime., Disp: , Rfl:    Blood Glucose Monitoring Suppl (BLOOD GLUCOSE MONITOR SYSTEM) w/Device KIT, use as directed, Disp: 1 kit, Rfl: 0   busPIRone (BUSPAR) 15 MG tablet, Take 15 mg by mouth 3 (three) times daily., Disp: , Rfl:  Cholecalciferol (VITAMIN D-3) 125 MCG (5000 UT) TABS, Take 5,000 Units by mouth in the morning., Disp: , Rfl:    dalfampridine 10 MG TB12, One po q12 hours, Disp: 60 tablet, Rfl: 5   dicyclomine (BENTYL) 20 MG tablet, Take 20 mg by mouth in the morning and at bedtime., Disp: , Rfl:    diphenoxylate-atropine (LOMOTIL) 2.5-0.025 MG tablet, Take 3 tablets by mouth 2 (two) times daily as needed for diarrhea or loose stools., Disp: , Rfl:    DULoxetine (CYMBALTA) 60 MG capsule, Take 60 mg by mouth 2 (two) times daily. , Disp: , Rfl:    fluticasone (FLONASE) 50 MCG/ACT nasal spray, Place 2 sprays into the nose daily. (Patient taking differently: Place 2 sprays into the nose in the morning and at bedtime.), Disp: 16 g, Rfl: 11   glucose blood (ACCU-CHEK GUIDE) test strip, Use as instructed up to 4 times daily, Disp: 100 each, Rfl: 12   Homeopathic Products (LEG CRAMPS PO), Place 3 tablets under the tongue daily as needed (For leg cramps)., Disp: , Rfl:    ibuprofen (ADVIL) 800 MG tablet, Take 800 mg by mouth 3 (three) times daily as needed for moderate pain. , Disp: , Rfl:    insulin glargine (LANTUS) 100  UNIT/ML Solostar Pen, Inject 40 Units into the skin daily., Disp: 15 mL, Rfl: 2   insulin lispro (HUMALOG) 100 UNIT/ML KwikPen, Inject 10 Units into the skin 3 (three) times daily with meals. (Patient taking differently: Inject 0-6 Units into the skin 3 (three) times daily with meals. Sliding scale insulin), Disp: 15 mL, Rfl: 2   Insulin Pen Needle 32G X 4 MM MISC, use as directed 4 times daily, Disp: 200 each, Rfl: 2   lamoTRIgine (LAMICTAL) 200 MG tablet, One po twicw daily, Disp: 60 tablet, Rfl: 11   losartan (COZAAR) 100 MG tablet, Take 1 tablet (100 mg total) by mouth daily. (Patient taking differently: Take 100 mg by mouth at bedtime.), Disp: 90 tablet, Rfl: 3   MAGNESIUM-OXIDE 400 (241.3 Mg) MG tablet, Take 400 mg by mouth in the morning., Disp: , Rfl:    MELATONIN MAXIMUM STRENGTH 5 MG TABS, Take 10-15 mg by mouth at bedtime as needed (sleep)., Disp: , Rfl:    montelukast (SINGULAIR) 10 MG tablet, Take 10 mg by mouth at bedtime., Disp: , Rfl:    NARCAN 4 MG/0.1ML LIQD nasal spray kit, Place 1 spray into the nose as directed. overdose, Disp: , Rfl:    oxyCODONE (ROXICODONE) 15 MG immediate release tablet, Take 15 mg by mouth every 6 (six) hours as needed for pain., Disp: , Rfl:    TECFIDERA 240 MG CPDR, Take 240 mg by mouth 2 (two) times daily., Disp: , Rfl:    tiZANidine (ZANAFLEX) 2 MG tablet, Take 2 mg by mouth 4 (four) times daily., Disp: , Rfl:    triamterene-hydrochlorothiazide (MAXZIDE-25) 37.5-25 MG tablet, Take 1 tablet by mouth every evening., Disp: , Rfl:    VIBERZI 100 MG TABS, Take 100 mg by mouth 2 (two) times daily., Disp: , Rfl:    vitamin B-12 (CYANOCOBALAMIN) 1000 MCG tablet, Take 1,000 mcg by mouth in the morning., Disp: , Rfl:    XIIDRA 5 % SOLN, Place 1 drop into both eyes in the morning and at bedtime. , Disp: , Rfl:    zolpidem (AMBIEN) 10 MG tablet, Take 10 mg by mouth at bedtime. , Disp: , Rfl:   PAST MEDICAL HISTORY: Past Medical History:  Diagnosis Date  Diabetes mellitus    Hypertension    Multiple sclerosis exacerbation ([place])    Neuropathy     PAST SURGICAL HISTORY: Past Surgical History:  Procedure Laterality Date   ABDOMINAL HYSTERECTOMY     partial   I & D EXTREMITY Right 12/14/2020   Procedure: IRRIGATION AND DEBRIDEMENT RIGHT LOWER EXTREMITY;  Surgeon: Rod Can, MD;  Location: [place];  Service: Orthopedics;  Laterality: Right;   JOINT REPLACEMENT     LTK   TUBAL LIGATION      FAMILY HISTORY: Family History  Problem Relation Age of Onset   Diabetes Mother    Hypertension Mother    Stroke Father        DIAGNOSTIC DATA (LABS, IMAGING, TESTING) - I reviewed patient records, labs, notes, testing and imaging myself where available.  Lab Results  Component Value Date   WBC 7.4 12/21/2020   HGB 13.4 12/21/2020   HCT 40.9 12/21/2020   MCV 91.3 12/21/2020   PLT 347 12/21/2020      Component Value Date/Time   NA 142 12/21/2020 1433   K 3.5 12/21/2020 1433   CL 106 12/21/2020 1433   CO2 27 12/21/2020 1433   GLUCOSE 98 12/21/2020 1433   BUN 9 12/21/2020 1433   CREATININE 0.59 12/21/2020 1433   CREATININE 0.85 02/24/2012 1044   CALCIUM 9.5 12/21/2020 1433   PROT 6.9 12/21/2020 1433   ALBUMIN 3.6 12/21/2020 1433   AST 18 12/21/2020 1433   ALT 23 12/21/2020 1433   ALKPHOS 69 12/21/2020 1433   BILITOT 0.2 (L) 12/21/2020 1433   GFRNONAA >60 12/21/2020 1433   GFRAA >60 11/17/2019 1242   Lab Results  Component Value Date   CHOL (H) 02/19/2009    237        ATP III CLASSIFICATION:  <200     mg/dL   Desirable  200-239  mg/dL   Borderline High  >=240    mg/dL   High          HDL 52 02/19/2009   LDLCALC (H) 02/19/2009    135        Total Cholesterol/HDL:CHD Risk Coronary Heart Disease Risk Table                     Men   Women  1/2 Average Risk   3.4   3.3  Average Risk       5.0   4.4  2 X Average Risk   9.6   7.1  3 X Average Risk  23.4   11.0        Use the calculated Patient Ratio above and the  CHD Risk Table to determine the patient's CHD Risk.        ATP III CLASSIFICATION (LDL):  <100     mg/dL   Optimal  100-129  mg/dL   Near or Above                    Optimal  130-159  mg/dL   Borderline  160-189  mg/dL   High  >190     mg/dL   Very High   TRIG 249 (H) 02/19/2009   CHOLHDL 4.6 02/19/2009   Lab Results  Component Value Date   HGBA1C 10.6 (H) 12/10/2020   Lab Results  Component Value Date   VITAMINB12 711 11/06/2008   Lab Results  Component Value Date   TSH 1.973 12/10/2020       ASSESSMENT AND PLAN  Multiple sclerosis (HCC)  Gait disturbance  High risk medication use  Diabetic peripheral neuropathy ([place])   1.    Continue Tecfidera.  Due to worsening strength and gait, we will check an MRI of the brain and cervical spine.  This is going to be set up in the hospital since she requires sedation.   2.    Encouraged weight loss.  She tries to be active and should try to increase his activity further.   4.    Return in 6 months or sooner if there are new or worsening neurologic symptoms.   Follow Up Instructions: I discussed the assessment and treatment plan with the patient. The patient was provided an opportunity to ask questions and all were answered. The patient agreed with the plan and demonstrated an understanding of the instructions.    The patient was advised to call back or seek an in-person evaluation if the symptoms worsen or if the condition fails to improve as anticipated.  I provided 18 minutes of non-face-to-face time during this encounter.  Darcey Cardy A. Felecia Shelling, MD, [facility] 50/11/2255, 5:05 PM Certified in Neurology, Clinical Neurophysiology, Sleep Medicine and Neuroimaging  [facility] [address] [place], [place] [address] [phone]

## 2021-02-17 NOTE — Telephone Encounter (Signed)
Took call from phone room and spoke w/ [facility] hospital Anesthesia, Revonda Standard, [place]. Pt scheduled tomorrow for MRI's. Needing updated H&P within the last 30 days. Pt last visit 01/14/21. I placed her on hold and called pt. Pt agreed to do VV today with Dr. Epimenio Foot. I scheduled pt. I helped her rest pw and get logged on. Revonda Standard, [place] will have radiology department fax H&P form to be filled out at [phone]. I received form. Faxed completed/signed form back to them at [phone]. Received fax confirmation.  Pt had already paid someone to bring her to appt tomorrow.

## 2021-02-18 ENCOUNTER — Encounter (HOSPITAL_COMMUNITY): Payer: Self-pay

## 2021-02-18 ENCOUNTER — Ambulatory Visit (HOSPITAL_COMMUNITY): Payer: Medicare Other | Admitting: Certified Registered Nurse Anesthetist

## 2021-02-18 ENCOUNTER — Ambulatory Visit (HOSPITAL_COMMUNITY)
Admission: RE | Admit: 2021-02-18 | Discharge: 2021-02-18 | Disposition: A | Discharge status: Home / Self Care | Payer: Medicare Other | Attending: Neurology | Admitting: Neurology

## 2021-02-18 ENCOUNTER — Encounter (HOSPITAL_COMMUNITY)
Admission: RE | Disposition: A | Discharge status: Home / Self Care | Payer: Self-pay | Source: Home / Self Care | Attending: Neurology

## 2021-02-18 ENCOUNTER — Ambulatory Visit (HOSPITAL_COMMUNITY)
Admission: RE | Admit: 2021-02-18 | Discharge: 2021-02-18 | Disposition: A | Discharge status: Home / Self Care | Payer: Medicare Other | Source: Ambulatory Visit | Attending: Neurology | Admitting: Neurology

## 2021-02-18 ENCOUNTER — Ambulatory Visit (HOSPITAL_COMMUNITY): Admission: RE | Admit: 2021-02-18 | Payer: Medicare Other | Source: Ambulatory Visit

## 2021-02-18 DIAGNOSIS — M47812 Spondylosis without myelopathy or radiculopathy, cervical region: Secondary | ICD-10-CM | POA: Insufficient documentation

## 2021-02-18 DIAGNOSIS — I10 Essential (primary) hypertension: Secondary | ICD-10-CM | POA: Diagnosis not present

## 2021-02-18 DIAGNOSIS — R269 Unspecified abnormalities of gait and mobility: Secondary | ICD-10-CM

## 2021-02-18 DIAGNOSIS — E119 Type 2 diabetes mellitus without complications: Secondary | ICD-10-CM | POA: Diagnosis not present

## 2021-02-18 DIAGNOSIS — Z79899 Other long term (current) drug therapy: Secondary | ICD-10-CM | POA: Insufficient documentation

## 2021-02-18 DIAGNOSIS — E669 Obesity, unspecified: Secondary | ICD-10-CM | POA: Diagnosis not present

## 2021-02-18 DIAGNOSIS — Z6841 Body Mass Index (BMI) 40.0 and over, adult: Secondary | ICD-10-CM | POA: Insufficient documentation

## 2021-02-18 DIAGNOSIS — Z794 Long term (current) use of insulin: Secondary | ICD-10-CM | POA: Diagnosis not present

## 2021-02-18 DIAGNOSIS — G35 Multiple sclerosis: Secondary | ICD-10-CM | POA: Insufficient documentation

## 2021-02-18 DIAGNOSIS — F17200 Nicotine dependence, unspecified, uncomplicated: Secondary | ICD-10-CM | POA: Insufficient documentation

## 2021-02-18 HISTORY — PX: RADIOLOGY WITH ANESTHESIA: SHX6223

## 2021-02-18 LAB — BASIC METABOLIC PANEL
Anion gap: 10 (ref 5–15)
BUN: 12 mg/dL (ref 8–23)
CO2: 25 mmol/L (ref 22–32)
Calcium: 9.3 mg/dL (ref 8.9–10.3)
Chloride: 105 mmol/L (ref 98–111)
Creatinine, Ser: 0.7 mg/dL (ref 0.44–1.00)
GFR, Estimated: >60 mL/min (ref >60)
Glucose, Bld: 125 mg/dL — ABNORMAL HIGH (ref 70–99)
Potassium: 3.4 mmol/L — ABNORMAL LOW (ref 3.5–5.1)
Sodium: 140 mmol/L (ref 135–145)

## 2021-02-18 LAB — GLUCOSE, CAPILLARY
Glucose-Capillary: 135 mg/dL — ABNORMAL HIGH (ref 70–99)
Glucose-Capillary: 92 mg/dL (ref 70–99)
Glucose-Capillary: 117 mg/dL — ABNORMAL HIGH (ref 70–99)

## 2021-02-18 SURGERY — MRI WITH ANESTHESIA
Anesthesia: General

## 2021-02-18 MED ORDER — CHLORHEXIDINE GLUCONATE 0.12 % MT SOLN
15.0000 mL | Freq: Once | OROMUCOSAL | Status: AC
  Administered 2021-02-18: 15 mL via OROMUCOSAL
  Filled 2021-02-18: qty 15

## 2021-02-18 MED ORDER — DEXAMETHASONE SODIUM PHOSPHATE 10 MG/ML IJ SOLN
INTRAMUSCULAR | Status: DC | PRN
  Administered 2021-02-18: 5 mg via INTRAVENOUS

## 2021-02-18 MED ORDER — LIDOCAINE 2% (20 MG/ML) 5 ML SYRINGE
INTRAMUSCULAR | Status: DC | PRN
  Administered 2021-02-18: 60 mg via INTRAVENOUS

## 2021-02-18 MED ORDER — ROCURONIUM BROMIDE 10 MG/ML (PF) SYRINGE
PREFILLED_SYRINGE | INTRAVENOUS | Status: DC | PRN
  Administered 2021-02-18: 50 mg via INTRAVENOUS

## 2021-02-18 MED ORDER — ONDANSETRON HCL 4 MG/2ML IJ SOLN
INTRAMUSCULAR | Status: DC | PRN
  Administered 2021-02-18: 4 mg via INTRAVENOUS

## 2021-02-18 MED ORDER — PHENYLEPHRINE HCL-NACL 20-0.9 MG/250ML-% IV SOLN
INTRAVENOUS | Status: DC | PRN
  Administered 2021-02-18: 30 ug/min via INTRAVENOUS

## 2021-02-18 MED ORDER — PHENYLEPHRINE 40 MCG/ML (10ML) SYRINGE FOR IV PUSH (FOR BLOOD PRESSURE SUPPORT)
PREFILLED_SYRINGE | INTRAVENOUS | Status: DC | PRN
Start: 2021-02-18 — End: 2021-02-18
  Administered 2021-02-18 (×5): 80 ug via INTRAVENOUS
  Administered 2021-02-18: 120 ug via INTRAVENOUS
  Administered 2021-02-18: 80 ug via INTRAVENOUS

## 2021-02-18 MED ORDER — ORAL CARE MOUTH RINSE: 15.0000 mL | Freq: Once | OROMUCOSAL | Status: AC

## 2021-02-18 MED ORDER — PROPOFOL 10 MG/ML IV BOLUS
INTRAVENOUS | Status: DC | PRN
  Administered 2021-02-18: 200 mg via INTRAVENOUS

## 2021-02-18 MED ORDER — LACTATED RINGERS IV SOLN: INTRAVENOUS | Status: DC

## 2021-02-18 MED ORDER — SUGAMMADEX SODIUM 200 MG/2ML IV SOLN
INTRAVENOUS | Status: DC | PRN
  Administered 2021-02-18 (×2): 200 mg via INTRAVENOUS

## 2021-02-18 MED ORDER — GADOBUTROL 1 MMOL/ML IV SOLN
10.0000 mL | Freq: Once | INTRAVENOUS | Status: AC | PRN
  Administered 2021-02-18: 10 mL via INTRAVENOUS

## 2021-02-18 NOTE — Transfer of Care (Signed)
Immediate Anesthesia Transfer of Care Note  Patient: [name]  Procedure(s) Performed: MRI WITH ANESTHESIA CERVICAL SPINE WITH AND WITHOUT CONTRAST AND BRAIN WITH AND WITHOUT CONTRAST  Patient Location: PACU  Anesthesia Type:General  Level of Consciousness: awake, alert  and patient cooperative  Airway & Oxygen Therapy: Patient Spontanous Breathing and Patient connected to nasal cannula oxygen  Post-op Assessment: Report given to RN and Post -op Vital signs reviewed and stable  Post vital signs: Reviewed and stable  Last Vitals:  Vitals Value Taken Time  BP 144/75 02/18/21 1336  Temp 36.7 C 02/18/21 1336  Pulse 103 02/18/21 1341  Resp 33 02/18/21 1341  SpO2 99 % 02/18/21 1341  Vitals shown include unvalidated device data.  Last Pain:  Vitals:   02/18/21 0807  TempSrc:   PainSc: 10-Worst pain ever      Patients Stated Pain Goal: 5 (02/18/21 0807)  Complications: No notable events documented.

## 2021-02-18 NOTE — Anesthesia Procedure Notes (Signed)
Procedure Name: Intubation Date/Time: 02/18/2021 11:55 AM Performed by: Janene Harvey, CRNA Pre-anesthesia Checklist: Patient identified, Emergency Drugs available, Suction available and Patient being monitored Patient Re-evaluated:Patient Re-evaluated prior to induction Oxygen Delivery Method: Circle system utilized Preoxygenation: Pre-oxygenation with 100% oxygen Induction Type: IV induction Ventilation: Mask ventilation without difficulty and Oral airway inserted - appropriate to patient size Laryngoscope Size: Mac and 4 Grade View: Grade II Tube type: Oral Tube size: 7.0 mm Number of attempts: 1 Airway Equipment and Method: Stylet and Oral airway Placement Confirmation: ETT inserted through vocal cords under direct vision, positive ETCO2 and breath sounds checked- equal and bilateral Secured at: 22 cm Tube secured with: Tape Dental Injury: Teeth and Oropharynx as per pre-operative assessment

## 2021-02-18 NOTE — Anesthesia Postprocedure Evaluation (Signed)
Anesthesia Post Note  Patient: [name]  Procedure(s) Performed: MRI WITH ANESTHESIA CERVICAL SPINE WITH AND WITHOUT CONTRAST AND BRAIN WITH AND WITHOUT CONTRAST     Patient location during evaluation: PACU Anesthesia Type: General Level of consciousness: awake and alert Pain management: pain level controlled Vital Signs Assessment: post-procedure vital signs reviewed and stable Respiratory status: spontaneous breathing, nonlabored ventilation, respiratory function stable and patient connected to nasal cannula oxygen Cardiovascular status: blood pressure returned to baseline and stable Postop Assessment: no apparent nausea or vomiting Anesthetic complications: no   No notable events documented.  Last Vitals:  Vitals:   02/18/21 1351 02/18/21 1405  BP: (!) 163/89 (!) 161/99  Pulse: (!) 108 (!) 106  Resp: 20 20  Temp:  36.7 C  SpO2: 94% 93%    Last Pain:  Vitals:   02/18/21 1405  TempSrc:   PainSc: 0-No pain                 Kennieth Rad

## 2021-02-19 ENCOUNTER — Encounter (HOSPITAL_COMMUNITY): Payer: Self-pay | Admitting: Radiology

## 2021-02-21 ENCOUNTER — Telehealth: Payer: Self-pay | Admitting: Neurology

## 2021-02-21 NOTE — Telephone Encounter (Signed)
Pt said she got a notification on mychart about results, pt would like a call on Monday with the results.

## 2021-02-24 NOTE — Telephone Encounter (Signed)
Dr. Epimenio Foot sent pt mychart message about results: "The MRI of the brain and cervical spine showed the old MS lesions but there is nothing new.  The MRI of the cervical spine also showed that you have disc protrusions at a couple of the levels.  This could cause some arm pain but should not affect your walking or balance"

## 2021-02-24 NOTE — Telephone Encounter (Signed)
Called pt and relayed results per Dr. Bonnita Hollow note. Advised I spoke with Dr. Epimenio Foot prior to calling. She denies any arm patient currently. She will let us know if she develops any new/worsening sx moving forward.   Per Dr. Epimenio Foot, if pt develops arm pain in future, can offer ESI. Right arm pain (ESI C7), left arm pain (ESI C6). Dx: radiculopathy.

## 2021-02-25 ENCOUNTER — Encounter: Payer: Medicare Other | Admitting: Vascular Surgery

## 2021-02-25 ENCOUNTER — Inpatient Hospital Stay (HOSPITAL_COMMUNITY): Admission: RE | Admit: 2021-02-25 | Payer: Medicare Other | Source: Ambulatory Visit

## 2021-03-05 ENCOUNTER — Other Ambulatory Visit: Payer: Self-pay | Admitting: Neurology

## 2021-03-06 ENCOUNTER — Other Ambulatory Visit: Payer: Self-pay | Admitting: Endocrinology

## 2021-03-06 DIAGNOSIS — Z1231 Encounter for screening mammogram for malignant neoplasm of breast: Secondary | ICD-10-CM

## 2021-03-20 ENCOUNTER — Other Ambulatory Visit: Payer: Self-pay | Admitting: Geriatric Medicine

## 2021-03-20 DIAGNOSIS — K580 Irritable bowel syndrome with diarrhea: Secondary | ICD-10-CM

## 2021-04-09 ENCOUNTER — Other Ambulatory Visit: Payer: Medicare Other

## 2021-04-22 ENCOUNTER — Ambulatory Visit: Payer: Medicare Other

## 2021-04-25 ENCOUNTER — Ambulatory Visit: Payer: Medicare Other | Admitting: Podiatry

## 2021-04-28 ENCOUNTER — Ambulatory Visit: Payer: Self-pay | Admitting: Neurology

## 2021-05-05 ENCOUNTER — Other Ambulatory Visit: Payer: Self-pay | Admitting: Neurology

## 2021-05-05 DIAGNOSIS — R269 Unspecified abnormalities of gait and mobility: Secondary | ICD-10-CM

## 2021-05-05 DIAGNOSIS — G35 Multiple sclerosis: Secondary | ICD-10-CM

## 2021-05-20 ENCOUNTER — Other Ambulatory Visit (HOSPITAL_COMMUNITY): Payer: Self-pay

## 2021-05-21 ENCOUNTER — Inpatient Hospital Stay: Admission: RE | Admit: 2021-05-21 | Payer: Medicare Other | Source: Ambulatory Visit

## 2021-05-22 ENCOUNTER — Other Ambulatory Visit: Payer: Self-pay | Admitting: Neurology

## 2021-05-23 ENCOUNTER — Ambulatory Visit: Payer: Medicare Other

## 2021-06-09 ENCOUNTER — Telehealth: Payer: Self-pay | Admitting: Neurology

## 2021-06-09 NOTE — Telephone Encounter (Signed)
Pt would like a call from the nurse to discuss an earlier appt than July. Started a week ago hand shaking out of control, falling (fell 2 days ago), have no balance

## 2021-06-09 NOTE — Telephone Encounter (Signed)
Called pt to further discuss. She would like to be seen. She is taking Tecfidera as prescribed, has not missed any doses. Denies any signs/sx of infection. She fell 3-4 days ago. Balance problems started 1.5 weeks ago. Hands are shaking, unable to hold items. Has diabetes but glucose levels ok. Scheduled work in appt for 06/12/21 at 8:30a. She will check in 8/8:15a. She has Cat scan at [facility] imaging at 10:30am that morning as well.

## 2021-06-11 ENCOUNTER — Ambulatory Visit: Payer: Medicare Other

## 2021-06-12 ENCOUNTER — Other Ambulatory Visit: Payer: Self-pay

## 2021-06-12 ENCOUNTER — Ambulatory Visit
Admission: RE | Admit: 2021-06-12 | Discharge: 2021-06-12 | Disposition: A | Discharge status: Home / Self Care | Payer: Medicare Other | Source: Ambulatory Visit | Attending: Geriatric Medicine | Admitting: Geriatric Medicine

## 2021-06-12 ENCOUNTER — Ambulatory Visit (INDEPENDENT_AMBULATORY_CARE_PROVIDER_SITE_OTHER): Payer: Medicare Other | Admitting: Neurology

## 2021-06-12 ENCOUNTER — Encounter: Payer: Self-pay | Admitting: Neurology

## 2021-06-12 VITALS — BP 116/86 | HR 84 | Ht 63.0 in | Wt 260.0 lb

## 2021-06-12 DIAGNOSIS — E1142 Type 2 diabetes mellitus with diabetic polyneuropathy: Secondary | ICD-10-CM | POA: Diagnosis not present

## 2021-06-12 DIAGNOSIS — M47816 Spondylosis without myelopathy or radiculopathy, lumbar region: Secondary | ICD-10-CM

## 2021-06-12 DIAGNOSIS — K580 Irritable bowel syndrome with diarrhea: Secondary | ICD-10-CM

## 2021-06-12 DIAGNOSIS — Z79899 Other long term (current) drug therapy: Secondary | ICD-10-CM

## 2021-06-12 DIAGNOSIS — M79604 Pain in right leg: Secondary | ICD-10-CM | POA: Diagnosis not present

## 2021-06-12 DIAGNOSIS — G8929 Other chronic pain: Secondary | ICD-10-CM

## 2021-06-12 DIAGNOSIS — M79605 Pain in left leg: Secondary | ICD-10-CM | POA: Diagnosis not present

## 2021-06-12 DIAGNOSIS — G35 Multiple sclerosis: Secondary | ICD-10-CM

## 2021-06-12 DIAGNOSIS — R269 Unspecified abnormalities of gait and mobility: Secondary | ICD-10-CM

## 2021-06-12 DIAGNOSIS — G35D Multiple sclerosis, unspecified: Secondary | ICD-10-CM

## 2021-06-12 DIAGNOSIS — R208 Other disturbances of skin sensation: Secondary | ICD-10-CM

## 2021-06-12 MED ORDER — KETOROLAC TROMETHAMINE 60 MG/2ML IM SOLN
60.0000 mg | Freq: Once | INTRAMUSCULAR | Status: AC
  Administered 2021-06-12: 60 mg via INTRAMUSCULAR

## 2021-06-12 MED ORDER — IOPAMIDOL (ISOVUE-300) INJECTION 61%
125.0000 mL | Freq: Once | INTRAVENOUS | Status: AC | PRN
  Administered 2021-06-12: 125 mL via INTRAVENOUS

## 2021-06-12 MED ORDER — AMANTADINE HCL 100 MG PO CAPS: 100.0000 mg | ORAL_CAPSULE | Freq: Two times a day (BID) | ORAL | 11 refills | Status: DC

## 2021-06-12 NOTE — Progress Notes (Signed)
[facility]  PATIENT: [name] DOB: Sep 15, 1960  REFERRING DOCTOR OR PCP: Lucky Cowboy, FNP SOURCE: Patient, notes from primary care, imaging and lab reports, MRI images personally reviewed.  _________________________________   HISTORICAL  CHIEF COMPLAINT:  Chief Complaint  Patient presents with   Follow-up    Rm 1, alone. Pt reports loosing balance and causing her to fall. Pt reports bilateral hand tremor. Last 2 weeks pt reports falling down about every other day. Knees are buckling.Using a cane.     HISTORY OF PRESENT ILLNESS:  [name] is a 61 y.o. woman with multiple sclerosis, LBP and gait disturbance  Update 06/12/2021: She is on Tecficdera and tolerates it well..  No definite exacerbation but she feels her MS is doing worse and she has more falls and worse balance.   She uses her cane and can go 20-30 feet on a good day but less on many days.     The left leg buckles and she falls Specifically she feels weaker and has had some stumbles/falls.   She has had some mild injuries with falls.   She has not used her walker much as she prefers the cane.   She is having some difficulty doing activities of daily living.   We started dalfampridine --- she does not think she is getting a benefit and we discussed stopping it and trying amantadine which sometimes helps gait      Due to worsening last year, we rechecked MRI nrain and cervical spine --- no changes.  Differential 02/2021 showed lymphocytes = 2.8.   LFTs fine.    She has a burning dysesthetic pain in her arms and legs.   She is on lamotrigine as well for pain.   Gabapentin had not helped and she gained weight on it .  Her vision is doing about the same.  She has urinary urgency and frequency.  She also has LBP and sees Dr. Christoper Fabian for pain management and is on  oxycodone 15 mg q6  She had MBBB/RFA helped the right side but not the left side.  Surgery has been discussed with her.   She is  also on tizanidine, baclofen and amitriptyline to help her pain.   She sees Dr. Maia Petties.    Pain is all over.    Mood is much worse and she is more anxious and depressed,    She is on Cymbalta 60 mg po qd and lamotrigine 200 mg po bid.       She has never had seizures.   Kidney function is normal (creatinine = 0.59 and 0.65 in 2022).   Ampyra had not helped gait  She has sleep maintenance more than sleep onset insomnia.  She is on eszopiclone   She sees [facility] for [facility] Management and other controlled substances.   MS HISTORY She was diagnosed with MS in 2008 after presenting with leg weakness and headache, neck pain, back pain.  MRIs showed foci in her cervical spine and brain.   She was started on Rebif.   Initially she did well but she reports having a lot of relapses.   She was switched to Copaxone but also had more symptoms.   She was then switched to Tysabri for several years but reports having several relapses.   She switched to Tecfidera 5 years ago.   She reports having a flare in 2015 and had 5 days of IV Solu-medrol.   July 2021, she was complaining  of blurry vision, stiff neck and increased burning and tingling a few weeks ago.  She went to the ED and had MRIs performed showing no new lesions.    IMAGING: MRI of the cervical spine 11/18/2019 and 03/02/2012 showed two T2 hyperintense foci adjacent to C2 and C3-C4.  There was mild multilevel degenerative changes as well.    MRI of the brain 11/18/2019 and 03/02/2020 show a fairly low plaque burden of T2/FLAIR hyperintense foci.  Most of them are nonspecific.  A few are periventricular.  None acute and no change over time.  MRI of the cervical spine 02/18/2021 showed no new lesions an stable DJD with C5C6 and C6C7 mild spinal senosis  MRI of the brain 02/18/2021 showed no change compared to 11/18/2019  REVIEW OF SYSTEMS: Constitutional: No fevers, chills, sweats, or change in appetite.  She has fatigue and she reports  insomnia Eyes: No visual changes, double vision, eye pain Ear, nose and throat: No hearing loss, ear pain, nasal congestion, sore throat Cardiovascular: No chest pain, palpitations Respiratory:  No shortness of breath at rest or with exertion.   No wheezes GastrointestinaI: No nausea, vomiting, diarrhea, abdominal pain, fecal incontinence Genitourinary:  No dysuria, urinary retention or frequency.  No nocturia. Musculoskeletal: Pain all over in muscles, joints and spine Integumentary: No rash, pruritus, skin lesions Neurological: as above Psychiatric: She has depression and anxiety Endocrine: No palpitations, diaphoresis, change in appetite, change in weigh or increased thirst Hematologic/Lymphatic:  No anemia, purpura, petechiae. Allergic/Immunologic: No itchy/runny eyes, nasal congestion, recent allergic reactions, rashes  ALLERGIES: No Known Allergies  HOME MEDICATIONS:  Current Outpatient Medications:    Accu-Chek Softclix Lancets lancets, Use as directed up to 4 times daily, Disp: 100 each, Rfl: 5   amantadine (SYMMETREL) 100 MG capsule, Take 1 capsule (100 mg total) by mouth 2 (two) times daily., Disp: 60 capsule, Rfl: 11   amitriptyline (ELAVIL) 25 MG tablet, TAKE 1 TABLET(25 MG) BY MOUTH AT BEDTIME, Disp: 30 tablet, Rfl: 5   ANTI-DIARRHEAL 2 MG tablet, Take 2-4 mg by mouth 4 (four) times daily as needed for diarrhea or loose stools., Disp: , Rfl:    atorvastatin (LIPITOR) 80 MG tablet, Take 1 tablet (80 mg total) by mouth daily. (Patient taking differently: Take 80 mg by mouth every evening.), Disp: 90 tablet, Rfl: 3   Biotin w/ Vitamins C & E (HAIR/SKIN/NAILS PO), Take 3 tablets by mouth in the morning and at bedtime., Disp: , Rfl:    Blood Glucose Monitoring Suppl (BLOOD GLUCOSE MONITOR SYSTEM) w/Device KIT, use as directed, Disp: 1 kit, Rfl: 0   Cholecalciferol (VITAMIN D-3) 125 MCG (5000 UT) TABS, Take 5,000 Units by mouth in the morning., Disp: , Rfl:    dicyclomine (BENTYL)  20 MG tablet, Take 20 mg by mouth in the morning and at bedtime., Disp: , Rfl:    diphenoxylate-atropine (LOMOTIL) 2.5-0.025 MG tablet, Take 3 tablets by mouth 2 (two) times daily as needed for diarrhea or loose stools., Disp: , Rfl:    DULoxetine (CYMBALTA) 60 MG capsule, Take 60 mg by mouth 2 (two) times daily. , Disp: , Rfl:    fluticasone (FLONASE) 50 MCG/ACT nasal spray, Place 2 sprays into the nose daily. (Patient taking differently: Place 2 sprays into the nose in the morning and at bedtime.), Disp: 16 g, Rfl: 11   glucose blood (ACCU-CHEK GUIDE) test strip, Use as instructed up to 4 times daily, Disp: 100 each, Rfl: 12   Homeopathic Products (LEG CRAMPS  PO), Place 3 tablets under the tongue daily as needed (For leg cramps)., Disp: , Rfl:    ibuprofen (ADVIL) 800 MG tablet, Take 800 mg by mouth 3 (three) times daily as needed for moderate pain. , Disp: , Rfl:    insulin glargine (LANTUS) 100 UNIT/ML Solostar Pen, Inject 40 Units into the skin daily., Disp: 15 mL, Rfl: 2   insulin lispro (HUMALOG) 100 UNIT/ML KwikPen, Inject 10 Units into the skin 3 (three) times daily with meals. (Patient taking differently: Inject 0-6 Units into the skin 3 (three) times daily with meals. Sliding scale insulin), Disp: 15 mL, Rfl: 2   Insulin Pen Needle 32G X 4 MM MISC, use as directed 4 times daily, Disp: 200 each, Rfl: 2   lamoTRIgine (LAMICTAL) 200 MG tablet, TAKE 1 TABLET BY MOUTH TWICE DAILY, Disp: 60 tablet, Rfl: 11   losartan (COZAAR) 100 MG tablet, Take 1 tablet (100 mg total) by mouth daily. (Patient taking differently: Take 100 mg by mouth at bedtime.), Disp: 90 tablet, Rfl: 3   MAGNESIUM-OXIDE 400 (241.3 Mg) MG tablet, Take 400 mg by mouth in the morning., Disp: , Rfl:    MELATONIN MAXIMUM STRENGTH 5 MG TABS, Take 10-15 mg by mouth at bedtime as needed (sleep)., Disp: , Rfl:    montelukast (SINGULAIR) 10 MG tablet, Take 10 mg by mouth at bedtime., Disp: , Rfl:    NARCAN 4 MG/0.1ML LIQD nasal spray  kit, Place 1 spray into the nose as directed. overdose, Disp: , Rfl:    oxyCODONE (ROXICODONE) 15 MG immediate release tablet, Take 15 mg by mouth every 6 (six) hours as needed for pain., Disp: , Rfl:    TECFIDERA 240 MG CPDR, Take 240 mg by mouth 2 (two) times daily., Disp: , Rfl:    tiZANidine (ZANAFLEX) 2 MG tablet, Take 2 mg by mouth 4 (four) times daily., Disp: , Rfl:    triamterene-hydrochlorothiazide (MAXZIDE-25) 37.5-25 MG tablet, Take 1 tablet by mouth every evening., Disp: , Rfl:    VIBERZI 100 MG TABS, Take 100 mg by mouth 2 (two) times daily., Disp: , Rfl:    vitamin B-12 (CYANOCOBALAMIN) 1000 MCG tablet, Take 1,000 mcg by mouth in the morning., Disp: , Rfl:    XIIDRA 5 % SOLN, Place 1 drop into both eyes in the morning and at bedtime. , Disp: , Rfl:    zolpidem (AMBIEN) 10 MG tablet, Take 10 mg by mouth at bedtime. , Disp: , Rfl:    amLODipine (NORVASC) 5 MG tablet, Take 1 tablet (5 mg total) by mouth daily. (Patient taking differently: Take 5 mg by mouth every evening.), Disp: 30 tablet, Rfl: 2  PAST MEDICAL HISTORY: Past Medical History:  Diagnosis Date   Diabetes mellitus    Hypertension    Multiple sclerosis exacerbation ([place])    Neuropathy     PAST SURGICAL HISTORY: Past Surgical History:  Procedure Laterality Date   ABDOMINAL HYSTERECTOMY     partial   I & D EXTREMITY Right 12/14/2020   Procedure: IRRIGATION AND DEBRIDEMENT RIGHT LOWER EXTREMITY;  Surgeon: Rod Can, MD;  Location: [place];  Service: Orthopedics;  Laterality: Right;   JOINT REPLACEMENT     LTK   RADIOLOGY WITH ANESTHESIA N/A 02/18/2021   Procedure: MRI WITH ANESTHESIA CERVICAL SPINE WITH AND WITHOUT CONTRAST AND BRAIN WITH AND WITHOUT CONTRAST;  Surgeon: Radiologist, Medication, MD;  Location: [place];  Service: Radiology;  Laterality: N/A;   SACRAL NERVE STIMULATOR PLACEMENT     [employer]  TUBAL LIGATION      FAMILY HISTORY: Family History  Problem Relation Age of Onset   Diabetes Mother     Hypertension Mother    Stroke Father     SOCIAL HISTORY:  Social History   Socioeconomic History   Marital status: Single    Spouse name: Not on file   Number of children: 2   Years of education: Not on file   Highest education level: Not on file  Occupational History   Not on file  Tobacco Use   Smoking status: Every Day    Packs/day: 0.25    Years: 12.00    Pack years: 3.00    Types: Cigarettes   Smokeless tobacco: Never   Tobacco comments:    06/13/20 1 pk per 3 days  Substance and Sexual Activity   Alcohol use: No   Drug use: No   Sexual activity: Not on file  Other Topics Concern   Not on file  Social History Narrative   06/13/20 lives alone, dgtr across the street   Social Determinants of Health   Financial Resource Strain: Not on file  Food Insecurity: Not on file  Transportation Needs: Not on file  Physical Activity: Not on file  Stress: Not on file  Social Connections: Not on file  Intimate Partner Violence: Not on file     PHYSICAL EXAM  Vitals:   06/12/21 0811  BP: 116/86  Pulse: 84  Weight: 260 lb (117.9 kg)  Height: _0  (1.6 m)    Body mass index is 46.06 kg/m.   General: The patient is well-developed and well-nourished and in no acute distress  HEENT:  Head is [place]/AT.  Sclera are anicteric.     Skin: Extremities are without rash.  She has some edema at the ankles.  Musculoskeletal: She has tenderness in the cervical and lumbar paraspinal muscles.  Neurologic Exam  Mental status: The patient is alert and oriented x 3 at the time of the examination. The patient has apparent normal recent and remote memory, with an apparently normal attention span and concentration ability.   Speech is normal.  Cranial nerves: Extraocular movements are full.  Facial strength and sensation was normal.  No obvious hearing deficits are noted.  Motor:  Muscle bulk is normal.   Tone is normal. Strength is  5 / 5 in all 4 extremities except 4+/5 ankles,4/5  toes, slightly worse on left. .   Sensory: She reported reduced sensation to touch and temperature in the left arm and leg.  Vibration sensation was more symmetric.  She has decreased sensation in toes and ankles to vibration compared to knees  Coordination: Cerebellar testing reveals good finger-nose-finger and heel-to-shin bilaterally.  Gait and station: Station is normal.   Gait is arthritic and wide wih reduced stride.  She has difficulty with tandem gait.  Romberg is negative.   Reflexes: Deep tendon reflexes are 1 and symmetric in the limbs.       DIAGNOSTIC DATA (LABS, IMAGING, TESTING) - I reviewed patient records, labs, notes, testing and imaging myself where available.  Lab Results  Component Value Date   WBC 7.4 12/21/2020   HGB 13.4 12/21/2020   HCT 40.9 12/21/2020   MCV 91.3 12/21/2020   PLT 347 12/21/2020      Component Value Date/Time   NA 140 02/18/2021 0746   K 3.4 (L) 02/18/2021 0746   CL 105 02/18/2021 0746   CO2 25 02/18/2021 0746   GLUCOSE 125 (H)  02/18/2021 0746   BUN 12 02/18/2021 0746   CREATININE 0.70 02/18/2021 0746   CREATININE 0.85 02/24/2012 1044   CALCIUM 9.3 02/18/2021 0746   PROT 6.9 12/21/2020 1433   ALBUMIN 3.6 12/21/2020 1433   AST 18 12/21/2020 1433   ALT 23 12/21/2020 1433   ALKPHOS 69 12/21/2020 1433   BILITOT 0.2 (L) 12/21/2020 1433   GFRNONAA >60 02/18/2021 0746   GFRAA >60 11/17/2019 1242   Lab Results  Component Value Date   CHOL (H) 02/19/2009    237        ATP III CLASSIFICATION:  <200     mg/dL   Desirable  200-239  mg/dL   Borderline High  >=240    mg/dL   High          HDL 52 02/19/2009   LDLCALC (H) 02/19/2009    135        Total Cholesterol/HDL:CHD Risk Coronary Heart Disease Risk Table                     Men   Women  1/2 Average Risk   3.4   3.3  Average Risk       5.0   4.4  2 X Average Risk   9.6   7.1  3 X Average Risk  23.4   11.0        Use the calculated Patient Ratio above and the CHD Risk  Table to determine the patient's CHD Risk.        ATP III CLASSIFICATION (LDL):  <100     mg/dL   Optimal  100-129  mg/dL   Near or Above                    Optimal  130-159  mg/dL   Borderline  160-189  mg/dL   High  >190     mg/dL   Very High   TRIG 249 (H) 02/19/2009   CHOLHDL 4.6 02/19/2009   Lab Results  Component Value Date   HGBA1C 10.6 (H) 12/10/2020   Lab Results  Component Value Date   VITAMINB12 711 11/06/2008   Lab Results  Component Value Date   TSH 1.973 12/10/2020       ASSESSMENT AND PLAN  Multiple sclerosis (HCC)  Gait disturbance  High risk medication use  Diabetic peripheral neuropathy (HCC)  Other chronic pain  Pain in both lower extremities  Lumbar spondylosis   1.    Continue Tecfidera.  D/c Dalfampridine and start amantadine for gait .   She did not feel PT helped in past.   Continue lamotrigine for dysesthesias.   2.    Toradol 60 mg IM x 1 for myalgias and leg pain.   She will continue to see pain management for medications and procedures.  3.    Encouraged weight loss.  She tries to be active and should try to increase his activity further.   4.    Use walker instead of cane.   If continues tohave a lot of falls, may need electric wheelchair. 5.   Consider Abilify or referral to psych if mood worsens  6.   Return in 6 months or sooner if there are new or worsening neurologic symptoms.  40-minute office visit with the majority of the time spent face-to-face for history and physical, discussion/counseling and decision-making.  Additional time with record review and documentation.  Duanne Duchesne A. Felecia Shelling, MD, [facility] 4/65/0354, 6:56 AM Certified in  Neurology, Clinical Neurophysiology, Sleep Medicine and Neuroimaging  [facility] [address], [place],  [address] [phone]

## 2021-06-13 ENCOUNTER — Other Ambulatory Visit: Payer: Self-pay | Admitting: Endocrinology

## 2021-06-13 ENCOUNTER — Telehealth: Payer: Self-pay | Admitting: Neurology

## 2021-06-13 DIAGNOSIS — R269 Unspecified abnormalities of gait and mobility: Secondary | ICD-10-CM

## 2021-06-13 DIAGNOSIS — Z1231 Encounter for screening mammogram for malignant neoplasm of breast: Secondary | ICD-10-CM

## 2021-06-13 DIAGNOSIS — G35 Multiple sclerosis: Secondary | ICD-10-CM

## 2021-06-13 NOTE — Telephone Encounter (Signed)
Pt would now like for Dr Epimenio Foot to go ahead and place the order for a new wheelchair with a seat.

## 2021-06-16 NOTE — Telephone Encounter (Signed)
Called pt. She is wanting 4 wheel rollator walker w/ seat. Has no preference on DME. Aware we will send order to [employer] and for her to reach back out if she does not hear from them in the next couple weeks. She verbalized understanding. I sent community message to [employer] that order placed.

## 2021-06-26 MED ORDER — OXCARBAZEPINE 300 MG PO TABS: 300.0000 mg | ORAL_TABLET | Freq: Two times a day (BID) | ORAL | 5 refills | Status: DC

## 2021-06-26 NOTE — Telephone Encounter (Signed)
LVM for pt relaying Dr. Bonnita Hollow recommendation. E-scribed to pharmacy. Asked that she try and take for 2-3 weeks to allow it to reach max benefit before determining effectiveness. Asked her to call office back if she has any further questions/concerns.

## 2021-06-26 NOTE — Telephone Encounter (Signed)
[name] forwarded another message to [name] to get update, we are waiting on her response.

## 2021-06-26 NOTE — Telephone Encounter (Signed)
I called pt to let her know [facility] has received order. Another message sent today to try and get update. Waiting on response.  She also wanted to report she has continued to have shaking/tremors all over. Worse since visit 06/12/21. For past two days, legs on "fire" and having burning in ankle/feet. Confirmed she is still taking lamotrigine. Reminded her to stop dalfampridine and only take amantadine. Aware I will be sending to MD for review since she recently had visit. She wanted to make another appt to come in.

## 2021-06-26 NOTE — Addendum Note (Signed)
Addended by: Wyvonnia Lora on: 06/26/2021 04:57 PM   Modules accepted: Orders

## 2021-06-26 NOTE — Telephone Encounter (Signed)
Sent another community message to [employer] to f/u on this. Marked urgent, waiting on response.

## 2021-06-26 NOTE — Telephone Encounter (Signed)
Pt informing nurse, no one has reached out to her concerning ordering a walker with a seat. Would like a call from the nurse.

## 2021-06-29 ENCOUNTER — Encounter (HOSPITAL_COMMUNITY): Payer: Self-pay | Admitting: Emergency Medicine

## 2021-06-29 ENCOUNTER — Emergency Department (HOSPITAL_COMMUNITY)
Admission: EM | Admit: 2021-06-29 | Discharge: 2021-06-29 | Disposition: A | Discharge status: Home / Self Care | Payer: Medicare Other | Attending: Emergency Medicine | Admitting: Emergency Medicine

## 2021-06-29 ENCOUNTER — Emergency Department (HOSPITAL_COMMUNITY): Payer: Medicare Other

## 2021-06-29 DIAGNOSIS — M542 Cervicalgia: Secondary | ICD-10-CM | POA: Diagnosis not present

## 2021-06-29 DIAGNOSIS — M62838 Other muscle spasm: Secondary | ICD-10-CM | POA: Diagnosis not present

## 2021-06-29 DIAGNOSIS — R5383 Other fatigue: Secondary | ICD-10-CM | POA: Diagnosis not present

## 2021-06-29 DIAGNOSIS — R519 Headache, unspecified: Secondary | ICD-10-CM | POA: Insufficient documentation

## 2021-06-29 DIAGNOSIS — R200 Anesthesia of skin: Secondary | ICD-10-CM | POA: Diagnosis not present

## 2021-06-29 LAB — CBC WITH DIFFERENTIAL/PLATELET
Abs Immature Granulocytes: 0.02 10*3/uL (ref 0.00–0.07)
Basophils Absolute: 0 10*3/uL (ref 0.0–0.1)
Basophils Relative: 0 %
Eosinophils Absolute: 0.1 10*3/uL (ref 0.0–0.5)
Eosinophils Relative: 1 %
HCT: 39.8 % (ref 36.0–46.0)
Hemoglobin: 13.5 g/dL (ref 12.0–15.0)
Immature Granulocytes: 0 %
Lymphocytes Relative: 26 %
Lymphs Abs: 2.3 10*3/uL (ref 0.7–4.0)
MCH: 30.8 pg (ref 26.0–34.0)
MCHC: 33.9 g/dL (ref 30.0–36.0)
MCV: 90.7 fL (ref 80.0–100.0)
Monocytes Absolute: 0.6 10*3/uL (ref 0.1–1.0)
Monocytes Relative: 6 %
Neutro Abs: 6 10*3/uL (ref 1.7–7.7)
Neutrophils Relative %: 67 %
Platelets: 272 10*3/uL (ref 150–400)
RBC: 4.39 MIL/uL (ref 3.87–5.11)
RDW: 12.6 % (ref 11.5–15.5)
WBC: 9 10*3/uL (ref 4.0–10.5)
nRBC: 0 % (ref 0.0–0.2)

## 2021-06-29 LAB — COMPREHENSIVE METABOLIC PANEL
ALT: 18 U/L (ref 0–44)
AST: 16 U/L (ref 15–41)
Albumin: 4 g/dL (ref 3.5–5.0)
Alkaline Phosphatase: 68 U/L (ref 38–126)
Anion gap: 9 (ref 5–15)
BUN: 12 mg/dL (ref 8–23)
CO2: 26 mmol/L (ref 22–32)
Calcium: 9.3 mg/dL (ref 8.9–10.3)
Chloride: 104 mmol/L (ref 98–111)
Creatinine, Ser: 0.83 mg/dL (ref 0.44–1.00)
GFR, Estimated: >60 mL/min (ref >60)
Glucose, Bld: 98 mg/dL (ref 70–99)
Potassium: 3.4 mmol/L — ABNORMAL LOW (ref 3.5–5.1)
Sodium: 139 mmol/L (ref 135–145)
Total Bilirubin: 0.3 mg/dL (ref 0.3–1.2)
Total Protein: 7.3 g/dL (ref 6.5–8.1)

## 2021-06-29 MED ORDER — METHOCARBAMOL 500 MG PO TABS
500.0000 mg | ORAL_TABLET | Freq: Once | ORAL | Status: AC
  Administered 2021-06-29: 500 mg via ORAL
  Filled 2021-06-29: qty 1

## 2021-06-29 MED ORDER — IOHEXOL 350 MG/ML SOLN
100.0000 mL | Freq: Once | INTRAVENOUS | Status: AC | PRN
  Administered 2021-06-29: 100 mL via INTRAVENOUS

## 2021-06-29 MED ORDER — KETOROLAC TROMETHAMINE 15 MG/ML IJ SOLN
15.0000 mg | Freq: Once | INTRAMUSCULAR | Status: AC
Start: 2021-06-29 — End: 2021-06-29
  Administered 2021-06-29: 15 mg via INTRAVENOUS
  Filled 2021-06-29: qty 1

## 2021-06-29 NOTE — ED Triage Notes (Addendum)
Per EMS, patient from home, c/o headache x1 hour with fatigue and tingling all over per patient. Hx MS. Ambulatory with EMS.

## 2021-06-29 NOTE — ED Provider Triage Note (Signed)
Emergency Medicine Provider Triage Evaluation Note  [name] , a 61 y.o. female  was evaluated in triage.  Pt complains of headache.  She has a history of multiple sclerosis.  Patient states that an hour and a half ago she developed right-sided facial numbness and right-sided neck pain.  She states that she drifted off to sleep very easily.  She also urinated 5 times in a row.  She states that she presented due to pain.  Review of Systems  Positive: Neck pain, facial numbness Negative: Fever  Physical Exam  BP 140/84 (BP Location: Left Arm)    Pulse 90    Temp 98 F (36.7 C) (Oral)    Resp 16    SpO2 94%  Gen:   Awake but drowsy, no distress   Resp:  Normal effort  MSK:   Moves extremities without difficulty  Other:    Medical Decision Making  Medically screening exam initiated at 2:51 PM.  Appropriate orders placed.  [name] was informed that the remainder of the evaluation will be completed by another provider, this initial triage assessment does not replace that evaluation, and the importance of remaining in the ED until their evaluation is complete.    Renne Crigler, PA-C 06/29/21 1452

## 2021-06-29 NOTE — ED Provider Notes (Addendum)
[facility] DEPT Provider Note   CSN: [id number] Arrival date & time: 06/29/21  1415     History  Chief Complaint  Patient presents with   Headache    [name] is a 61 y.o. female.  Patient is a 61 yo. Female presenting for headache. Pt admits to headache that started this evening. Patient describes pain while motioning to the  left posterior neck and radiating to the posterior scalp. States she decided right sided facial numbness that occurred then had difficulty staying awake and keep falling back asleep. Patient in waiting room for six hours prior to my evaluation. States "I even kept falling asleep in the waiting room. I would be using my phone then next thing I know I'm waking up to the sound of my phone hitting the floor. This is very unlike me"  The history is provided by the patient. No language interpreter was used.  Headache Associated symptoms: fatigue and numbness   Associated symptoms: no abdominal pain, no back pain, no cough, no ear pain, no eye pain, no fever, no seizures, no sore throat and no vomiting       Home Medications Prior to Admission medications   Medication Sig Start Date End Date Taking? Authorizing Provider  Accu-Chek Softclix Lancets lancets Use as directed up to 4 times daily 12/18/20   Mariel Aloe, MD  amantadine (SYMMETREL) 100 MG capsule Take 1 capsule (100 mg total) by mouth 2 (two) times daily. 06/12/21   Sater, Nanine Means, MD  amitriptyline (ELAVIL) 25 MG tablet TAKE 1 TABLET(25 MG) BY MOUTH AT BEDTIME 03/06/21   Sater, Nanine Means, MD  amLODipine (NORVASC) 5 MG tablet Take 1 tablet (5 mg total) by mouth daily. Patient taking differently: Take 5 mg by mouth every evening. 12/18/20 03/18/21  Mariel Aloe, MD  ANTI-DIARRHEAL 2 MG tablet Take 2-4 mg by mouth 4 (four) times daily as needed for diarrhea or loose stools. 07/31/19   [provider]  atorvastatin (LIPITOR) 80 MG tablet Take 1 tablet (80 mg  total) by mouth daily. Patient taking differently: Take 80 mg by mouth every evening. 02/24/12   Copland, Gay Filler, MD  Biotin w/ Vitamins C & E (HAIR/SKIN/NAILS PO) Take 3 tablets by mouth in the morning and at bedtime.    [provider]  Blood Glucose Monitoring Suppl (BLOOD GLUCOSE MONITOR SYSTEM) w/Device KIT use as directed 12/18/20   Mariel Aloe, MD  Cholecalciferol (VITAMIN D-3) 125 MCG (5000 UT) TABS Take 5,000 Units by mouth in the morning.    [provider]  dicyclomine (BENTYL) 20 MG tablet Take 20 mg by mouth in the morning and at bedtime. 10/23/19   [provider]  diphenoxylate-atropine (LOMOTIL) 2.5-0.025 MG tablet Take 3 tablets by mouth 2 (two) times daily as needed for diarrhea or loose stools.    [provider]  DULoxetine (CYMBALTA) 60 MG capsule Take 60 mg by mouth 2 (two) times daily.  10/04/19   [provider]  fluticasone (FLONASE) 50 MCG/ACT nasal spray Place 2 sprays into the nose daily. Patient taking differently: Place 2 sprays into the nose in the morning and at bedtime. 02/24/12   Copland, Gay Filler, MD  glucose blood (ACCU-CHEK GUIDE) test strip Use as instructed up to 4 times daily 12/18/20   Mariel Aloe, MD  Homeopathic Products (LEG CRAMPS PO) Place 3 tablets under the tongue daily as needed (For leg cramps).    [provider]  ibuprofen (ADVIL) 800 MG tablet Take 800 mg by mouth 3 (three) times daily as needed for moderate pain.  09/25/19   [provider]  insulin glargine (LANTUS) 100 UNIT/ML Solostar Pen Inject 40 Units into the skin daily. 12/18/20   Mariel Aloe, MD  insulin lispro (HUMALOG) 100 UNIT/ML KwikPen Inject 10 Units into the skin 3 (three) times daily with meals. Patient taking differently: Inject 0-6 Units into the skin 3 (three) times daily with meals. Sliding scale insulin 12/18/20   Mariel Aloe, MD  Insulin Pen Needle 32G X 4 MM MISC use as directed 4 times daily 12/18/20    Mariel Aloe, MD  lamoTRIgine (LAMICTAL) 200 MG tablet TAKE 1 TABLET BY MOUTH TWICE DAILY 05/22/21   Sater, Nanine Means, MD  losartan (COZAAR) 100 MG tablet Take 1 tablet (100 mg total) by mouth daily. Patient taking differently: Take 100 mg by mouth at bedtime. 02/24/12   Copland, Gay Filler, MD  MAGNESIUM-OXIDE 400 (241.3 Mg) MG tablet Take 400 mg by mouth in the morning. 10/18/19   [provider]  MELATONIN MAXIMUM STRENGTH 5 MG TABS Take 10-15 mg by mouth at bedtime as needed (sleep). 07/31/19   [provider]  montelukast (SINGULAIR) 10 MG tablet Take 10 mg by mouth at bedtime. 07/31/19   [provider]  NARCAN 4 MG/0.1ML LIQD nasal spray kit Place 1 spray into the nose as directed. overdose 10/11/19   [provider]  Oxcarbazepine (TRILEPTAL) 300 MG tablet Take 1 tablet (300 mg total) by mouth 2 (two) times daily. 06/26/21   Sater, Nanine Means, MD  oxyCODONE (ROXICODONE) 15 MG immediate release tablet Take 15 mg by mouth every 6 (six) hours as needed for pain. 09/11/19   [provider]  TECFIDERA 240 MG CPDR Take 240 mg by mouth 2 (two) times daily. 10/31/19   [provider]  tiZANidine (ZANAFLEX) 2 MG tablet Take 2 mg by mouth 4 (four) times daily. 10/23/19   [provider]  triamterene-hydrochlorothiazide (MAXZIDE-25) 37.5-25 MG tablet Take 1 tablet by mouth every evening. 01/09/21   [provider]  VIBERZI 100 MG TABS Take 100 mg by mouth 2 (two) times daily. 11/07/19   [provider]  vitamin B-12 (CYANOCOBALAMIN) 1000 MCG tablet Take 1,000 mcg by mouth in the morning. 07/31/19   [provider]  XIIDRA 5 % SOLN Place 1 drop into both eyes in the morning and at bedtime.  11/10/19   [provider]  zolpidem (AMBIEN) 10 MG tablet Take 10 mg by mouth at bedtime.  10/28/19   [provider]      Allergies    Patient has no known allergies.    Review of Systems   Review of Systems   Constitutional:  Positive for fatigue. Negative for chills and fever.  HENT:  Negative for ear pain and sore throat.   Eyes:  Negative for pain and visual disturbance.  Respiratory:  Negative for cough and shortness of breath.   Cardiovascular:  Negative for chest pain and palpitations.  Gastrointestinal:  Negative for abdominal pain and vomiting.  Genitourinary:  Negative for dysuria and hematuria.  Musculoskeletal:  Negative for arthralgias and back pain.  Skin:  Negative for color change and rash.  Neurological:  Positive for numbness and headaches. Negative for seizures and syncope.  All other systems reviewed and are negative.  Physical Exam Updated Vital Signs BP (!) 184/95 (BP Location: Left Arm)  Pulse 96    Temp 98 F (36.7 C) (Oral)    Resp 16    SpO2 97%  Physical Exam Vitals and nursing note reviewed.  Constitutional:      General: She is not in acute distress.    Appearance: She is well-developed.  HENT:     Head: Normocephalic and atraumatic.  Eyes:     General: Lids are normal. Vision grossly intact.     Conjunctiva/sclera: Conjunctivae normal.     Pupils: Pupils are equal, round, and reactive to light.  Neck:   Cardiovascular:     Rate and Rhythm: Normal rate and regular rhythm.     Heart sounds: No murmur heard. Pulmonary:     Effort: Pulmonary effort is normal. No respiratory distress.     Breath sounds: Normal breath sounds.  Abdominal:     Palpations: Abdomen is soft.     Tenderness: There is no abdominal tenderness.  Musculoskeletal:        General: No swelling.     Cervical back: Neck supple.     Comments: Muscle spasm trapezius right side  Skin:    General: Skin is warm and dry.     Capillary Refill: Capillary refill takes less than 2 seconds.  Neurological:     Mental Status: She is alert and oriented to person, place, and time.     GCS: GCS eye subscore is 4. GCS verbal subscore is 5. GCS motor subscore is 6.     Cranial Nerves: Cranial  nerves 2-12 are intact.     Sensory: Sensation is intact.     Motor: Motor function is intact.     Coordination: Coordination is intact.     Gait: Gait is intact.  Psychiatric:        Mood and Affect: Mood normal.    ED Results / Procedures / Treatments   Labs (all labs ordered are listed, but only abnormal results are displayed) Labs Reviewed  COMPREHENSIVE METABOLIC PANEL - Abnormal; Notable for the following components:      Result Value   Potassium 3.4 (*)    All other components within normal limits  CBC WITH DIFFERENTIAL/PLATELET    EKG None  Radiology CT Angio Head W or Wo Contrast  Result Date: 06/29/2021 CLINICAL DATA:  Initial evaluation for acute headache. EXAM: CT ANGIOGRAPHY HEAD AND NECK TECHNIQUE: Multidetector CT imaging of the head and neck was performed using the standard protocol during bolus administration of intravenous contrast. Multiplanar CT image reconstructions and MIPs were obtained to evaluate the vascular anatomy. Carotid stenosis measurements (when applicable) are obtained utilizing NASCET criteria, using the distal internal carotid diameter as the denominator. RADIATION DOSE REDUCTION: This exam was performed according to the departmental dose-optimization program which includes automated exposure control, adjustment of the mA and/or kV according to patient size and/or use of iterative reconstruction technique. CONTRAST:  153m OMNIPAQUE IOHEXOL 350 MG/ML SOLN COMPARISON:  MRI from 02/18/2021. FINDINGS: CT HEAD FINDINGS Brain: Cerebral volume within normal limits. Known cerebral white matter disease not well seen by CT. No acute intracranial hemorrhage. No acute large vessel territory infarct. No mass lesion or midline shift. No hydrocephalus or extra-axial fluid collection. Partially empty sella noted. Vascular: No hyperdense vessel. Skull: Scalp soft tissues and calvarium within normal limits. Sinuses: Clear. Orbits: Unremarkable. Review of the MIP images  confirms the above findings CTA NECK FINDINGS Aortic arch: Visualized aortic arch normal caliber with normal branch pattern. Mild atheromatous change about the origin of the  great vessels without significant stenosis. Right carotid system: Right common and internal carotid arteries partially medialized into the retropharyngeal space but are widely patent without stenosis or dissection. Mild atheromatous change about the right carotid bulb without flow-limiting stenosis. Approximate 50% stenosis noted at the origin of the right external carotid artery. Left carotid system: Left common and internal carotid arteries partially medialized into the retropharyngeal space but are widely patent without stenosis or dissection. Mild atheromatous change about the left carotid bulb without significant stenosis. Vertebral arteries: Both vertebral arteries arise from the subclavian arteries. No visible proximal subclavian artery stenosis. Visualized portions of the vertebral arteries patent without stenosis or dissection. Skeleton: No discrete or worrisome osseous lesions. Moderate spondylosis present at C5-6 and C6-7. Other neck: No other acute soft tissue abnormality within the neck. Upper chest: Visualized upper chest demonstrates no acute finding. Review of the MIP images confirms the above findings CTA HEAD FINDINGS Anterior circulation: Both internal carotid arteries patent to the termini without stenosis. Right A1 patent. Left A1 hypoplastic and/or absent, accounting for the diminutive left ICA is compared to the right. Normal anterior communicating artery complex. Anterior cerebral arteries patent without stenosis. No M1 stenosis or occlusion. Normal MCA bifurcations. Distal MCA branches perfused and fairly symmetric. Posterior circulation: Both V4 segments patent to the vertebrobasilar junction without stenosis. Both PICA origins patent and normal. Basilar widely patent to its distal aspect without stenosis. Superior  cerebellar arteries patent bilaterally. Both PCAs primarily supplied via the basilar and are well perfused to there distal aspects. Venous sinuses: Patent allowing for timing the contrast bolus. Anatomic variants: Hypoplastic/absent left A1. No intracranial aneurysm or other vascular malformation. Review of the MIP images confirms the above findings IMPRESSION: 1. Negative CTA of the head and neck. No large vessel occlusion or other acute vascular abnormality. 2. 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 3. No other acute intracranial abnormality. Electronically Signed   By: Jeannine Boga M.D.   On: 06/29/2021 22:05   CT Angio Neck W and/or Wo Contrast  Result Date: 06/29/2021 CLINICAL DATA:  Initial evaluation for acute headache. EXAM: CT ANGIOGRAPHY HEAD AND NECK TECHNIQUE: Multidetector CT imaging of the head and neck was performed using the standard protocol during bolus administration of intravenous contrast. Multiplanar CT image reconstructions and MIPs were obtained to evaluate the vascular anatomy. Carotid stenosis measurements (when applicable) are obtained utilizing NASCET criteria, using the distal internal carotid diameter as the denominator. RADIATION DOSE REDUCTION: This exam was performed according to the departmental dose-optimization program which includes automated exposure control, adjustment of the mA and/or kV according to patient size and/or use of iterative reconstruction technique. CONTRAST:  162m OMNIPAQUE IOHEXOL 350 MG/ML SOLN COMPARISON:  MRI from 02/18/2021. FINDINGS: CT HEAD FINDINGS Brain: Cerebral volume within normal limits. Known cerebral white matter disease not well seen by CT. No acute intracranial hemorrhage. No acute large vessel territory infarct. No mass lesion or midline shift. No hydrocephalus or extra-axial fluid collection. Partially empty sella noted. Vascular: No hyperdense vessel. Skull:  Scalp soft tissues and calvarium within normal limits. Sinuses: Clear. Orbits: Unremarkable. Review of the MIP images confirms the above findings CTA NECK FINDINGS Aortic arch: Visualized aortic arch normal caliber with normal branch pattern. Mild atheromatous change about the origin of the great vessels without significant stenosis. Right carotid system: Right common and internal carotid arteries partially medialized into the retropharyngeal space but are widely patent without stenosis or dissection. Mild atheromatous change about the  right carotid bulb without flow-limiting stenosis. Approximate 50% stenosis noted at the origin of the right external carotid artery. Left carotid system: Left common and internal carotid arteries partially medialized into the retropharyngeal space but are widely patent without stenosis or dissection. Mild atheromatous change about the left carotid bulb without significant stenosis. Vertebral arteries: Both vertebral arteries arise from the subclavian arteries. No visible proximal subclavian artery stenosis. Visualized portions of the vertebral arteries patent without stenosis or dissection. Skeleton: No discrete or worrisome osseous lesions. Moderate spondylosis present at C5-6 and C6-7. Other neck: No other acute soft tissue abnormality within the neck. Upper chest: Visualized upper chest demonstrates no acute finding. Review of the MIP images confirms the above findings CTA HEAD FINDINGS Anterior circulation: Both internal carotid arteries patent to the termini without stenosis. Right A1 patent. Left A1 hypoplastic and/or absent, accounting for the diminutive left ICA is compared to the right. Normal anterior communicating artery complex. Anterior cerebral arteries patent without stenosis. No M1 stenosis or occlusion. Normal MCA bifurcations. Distal MCA branches perfused and fairly symmetric. Posterior circulation: Both V4 segments patent to the vertebrobasilar junction without  stenosis. Both PICA origins patent and normal. Basilar widely patent to its distal aspect without stenosis. Superior cerebellar arteries patent bilaterally. Both PCAs primarily supplied via the basilar and are well perfused to there distal aspects. Venous sinuses: Patent allowing for timing the contrast bolus. Anatomic variants: Hypoplastic/absent left A1. No intracranial aneurysm or other vascular malformation. Review of the MIP images confirms the above findings IMPRESSION: 1. Negative CTA of the head and neck. No large vessel occlusion or other acute vascular abnormality. 2. 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 3. No other acute intracranial abnormality. Electronically Signed   By: Jeannine Boga M.D.   On: 06/29/2021 22:05    Procedures .Critical Care Performed by: Lianne Cure, DO Authorized by: Lianne Cure, DO   Critical care provider statement:    Critical care time (minutes):  48   Critical care was necessary to treat or prevent imminent or life-threatening deterioration of the following conditions: stroke like symtpoms versus vertebral artery dissection.   Critical care was time spent personally by me on the following activities:  Development of treatment plan with patient or surrogate, examination of patient, ordering and review of laboratory studies, ordering and review of radiographic studies, ordering and performing treatments and interventions, re-evaluation of patient's condition, review of old charts and obtaining history from patient or surrogate    Medications Ordered in ED Medications  iohexol (OMNIPAQUE) 350 MG/ML injection 100 mL (100 mLs Intravenous Contrast Given 06/29/21 2050)  ketorolac (TORADOL) 15 MG/ML injection 15 mg (15 mg Intravenous Given 06/29/21 2318)  methocarbamol (ROBAXIN) tablet 500 mg (500 mg Oral Given 06/29/21 2318)    ED Course/ Medical Decision Making/ A&P                            Medical Decision Making Amount and/or Complexity of Data Reviewed Labs: ordered. Radiology: ordered.  Risk Prescription drug management.    61 yo. female presenting for concerning history including new acute right sided neck pain, headache, and episodes of falling asleep unintentionally. Concern for life threatening/disabling etiologies. Considered vertebral artery dissection, CVA, cerebral artery stenosis, SAH, ect.   Patient was in waiting room for 6 hours prior to getting a room and my discussion with patient. On physical exam patient has muscle spasm of trapezius  muscle on right side with tenderness to palpation. Probable muscle spasm resulting in patient's symptoms.. However unable to explain episodes of falling asleep unintentionally. Ct head and CTA head and neck ordered to rule out other serious life threatening etiologies as discussed above.   Imaging as interpreted by radiology demonstrates: 1. Negative CTA of the head and neck. No large vessel occlusion or other acute vascular abnormality. 2. 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 3. No other acute intracranial abnormality.and neck demonstrates no acute process.   On re-evaluation patient remains neurologically intact. MRI technicians on call due to weekend service but are no longer on call due to time of night. Pt recommended for transfer to [facility] for MRI but declined by patient. Discussion had concerning possible stroke-like symptoms due to episode of facial numbness discussed. Patient requesting DC home stating "There is no way anybody is getting me into an MRI. I would have to be 100% sedated and asleep". I offered anxiolytics. Pt declined.   Patient discharged home at this time with shared patient decision making. Patient highly recommended to promptly return to ED for any worsening of symptoms. Pt agreeable to plan.           Final  Clinical Impression(s) / ED Diagnoses Final diagnoses:  Intractable headache, unspecified chronicity pattern, unspecified headache type  Muscle spasm  Neck pain    Rx / DC Orders ED Discharge Orders     None         Lianne Cure, DO 15/40/08 [id number]    Lianne Cure, DO 95/09/32 1202

## 2021-06-29 NOTE — Discharge Instructions (Signed)
Today you were offered MRI to rule out stroke and declined. Please return to ED if symptoms worsen in any way

## 2021-06-30 NOTE — Telephone Encounter (Addendum)
Received update from Melissa Stenson/[employer]: "Adding Colletta [place] for assistance." Received a message from Colletta [place]: "Got this! Thank you! "

## 2021-07-01 ENCOUNTER — Ambulatory Visit: Payer: Medicare Other

## 2021-07-06 ENCOUNTER — Emergency Department (HOSPITAL_COMMUNITY): Payer: Medicare Other

## 2021-07-06 ENCOUNTER — Other Ambulatory Visit: Payer: Self-pay

## 2021-07-06 ENCOUNTER — Encounter (HOSPITAL_COMMUNITY): Payer: Self-pay

## 2021-07-06 ENCOUNTER — Inpatient Hospital Stay (HOSPITAL_COMMUNITY)
Admission: EM | Admit: 2021-07-06 | Discharge: 2021-07-16 | DRG: 059 | Disposition: A | Discharge status: Skilled Nursing Facility | Payer: Medicare Other | Attending: Internal Medicine | Admitting: Internal Medicine

## 2021-07-06 DIAGNOSIS — Z8249 Family history of ischemic heart disease and other diseases of the circulatory system: Secondary | ICD-10-CM | POA: Diagnosis not present

## 2021-07-06 DIAGNOSIS — B9620 Unspecified Escherichia coli [E. coli] as the cause of diseases classified elsewhere: Secondary | ICD-10-CM | POA: Diagnosis present

## 2021-07-06 DIAGNOSIS — G35D Multiple sclerosis, unspecified: Secondary | ICD-10-CM

## 2021-07-06 DIAGNOSIS — F17210 Nicotine dependence, cigarettes, uncomplicated: Secondary | ICD-10-CM | POA: Diagnosis not present

## 2021-07-06 DIAGNOSIS — E1142 Type 2 diabetes mellitus with diabetic polyneuropathy: Secondary | ICD-10-CM | POA: Diagnosis not present

## 2021-07-06 DIAGNOSIS — E66813 Obesity, class 3: Secondary | ICD-10-CM | POA: Diagnosis present

## 2021-07-06 DIAGNOSIS — N390 Urinary tract infection, site not specified: Secondary | ICD-10-CM | POA: Diagnosis not present

## 2021-07-06 DIAGNOSIS — W19XXXA Unspecified fall, initial encounter: Secondary | ICD-10-CM | POA: Diagnosis present

## 2021-07-06 DIAGNOSIS — Z794 Long term (current) use of insulin: Secondary | ICD-10-CM | POA: Diagnosis not present

## 2021-07-06 DIAGNOSIS — M79661 Pain in right lower leg: Secondary | ICD-10-CM

## 2021-07-06 DIAGNOSIS — Z90711 Acquired absence of uterus with remaining cervical stump: Secondary | ICD-10-CM

## 2021-07-06 DIAGNOSIS — Z96652 Presence of left artificial knee joint: Secondary | ICD-10-CM | POA: Diagnosis present

## 2021-07-06 DIAGNOSIS — E876 Hypokalemia: Secondary | ICD-10-CM | POA: Diagnosis present

## 2021-07-06 DIAGNOSIS — R296 Repeated falls: Secondary | ICD-10-CM | POA: Diagnosis present

## 2021-07-06 DIAGNOSIS — Z9682 Presence of neurostimulator: Secondary | ICD-10-CM | POA: Diagnosis not present

## 2021-07-06 DIAGNOSIS — E8770 Fluid overload, unspecified: Secondary | ICD-10-CM

## 2021-07-06 DIAGNOSIS — E785 Hyperlipidemia, unspecified: Secondary | ICD-10-CM | POA: Diagnosis not present

## 2021-07-06 DIAGNOSIS — Z6841 Body Mass Index (BMI) 40.0 and over, adult: Secondary | ICD-10-CM | POA: Diagnosis not present

## 2021-07-06 DIAGNOSIS — E118 Type 2 diabetes mellitus with unspecified complications: Secondary | ICD-10-CM | POA: Diagnosis present

## 2021-07-06 DIAGNOSIS — E1165 Type 2 diabetes mellitus with hyperglycemia: Secondary | ICD-10-CM | POA: Diagnosis not present

## 2021-07-06 DIAGNOSIS — R531 Weakness: Secondary | ICD-10-CM

## 2021-07-06 DIAGNOSIS — G35 Multiple sclerosis: Secondary | ICD-10-CM | POA: Diagnosis not present

## 2021-07-06 DIAGNOSIS — Z79899 Other long term (current) drug therapy: Secondary | ICD-10-CM

## 2021-07-06 DIAGNOSIS — Z22322 Carrier or suspected carrier of Methicillin resistant Staphylococcus aureus: Secondary | ICD-10-CM | POA: Diagnosis not present

## 2021-07-06 DIAGNOSIS — G8929 Other chronic pain: Secondary | ICD-10-CM | POA: Diagnosis not present

## 2021-07-06 DIAGNOSIS — I10 Essential (primary) hypertension: Secondary | ICD-10-CM | POA: Diagnosis not present

## 2021-07-06 DIAGNOSIS — Z20822 Contact with and (suspected) exposure to covid-19: Secondary | ICD-10-CM | POA: Diagnosis not present

## 2021-07-06 LAB — DIFFERENTIAL
Abs Immature Granulocytes: 0.02 10*3/uL (ref 0.00–0.07)
Basophils Absolute: 0 10*3/uL (ref 0.0–0.1)
Basophils Relative: 0 %
Eosinophils Absolute: 0.1 10*3/uL (ref 0.0–0.5)
Eosinophils Relative: 1 %
Immature Granulocytes: 0 %
Lymphocytes Relative: 35 %
Lymphs Abs: 2.7 10*3/uL (ref 0.7–4.0)
Monocytes Absolute: 0.5 10*3/uL (ref 0.1–1.0)
Monocytes Relative: 7 %
Neutro Abs: 4.5 10*3/uL (ref 1.7–7.7)
Neutrophils Relative %: 57 %

## 2021-07-06 LAB — URINALYSIS, ROUTINE W REFLEX MICROSCOPIC
Bilirubin Urine: NEGATIVE
Glucose, UA: NEGATIVE mg/dL
Hgb urine dipstick: NEGATIVE
Ketones, ur: NEGATIVE mg/dL
Leukocytes,Ua: NEGATIVE
Nitrite: NEGATIVE
Protein, ur: NEGATIVE mg/dL
Specific Gravity, Urine: 1.012 (ref 1.005–1.030)
pH: 6 (ref 5.0–8.0)

## 2021-07-06 LAB — CBC WITH DIFFERENTIAL/PLATELET
Abs Immature Granulocytes: 0.03 10*3/uL (ref 0.00–0.07)
Basophils Absolute: 0 10*3/uL (ref 0.0–0.1)
Basophils Relative: 0 %
Eosinophils Absolute: 0 10*3/uL (ref 0.0–0.5)
Eosinophils Relative: 0 %
HCT: 37.5 % (ref 36.0–46.0)
Hemoglobin: 12.6 g/dL (ref 12.0–15.0)
Immature Granulocytes: 0 %
Lymphocytes Relative: 20 %
Lymphs Abs: 1.4 10*3/uL (ref 0.7–4.0)
MCH: 30.4 pg (ref 26.0–34.0)
MCHC: 33.6 g/dL (ref 30.0–36.0)
MCV: 90.6 fL (ref 80.0–100.0)
Monocytes Absolute: 0.5 10*3/uL (ref 0.1–1.0)
Monocytes Relative: 7 %
Neutro Abs: 5.3 10*3/uL (ref 1.7–7.7)
Neutrophils Relative %: 73 %
Platelets: 236 10*3/uL (ref 150–400)
RBC: 4.14 MIL/uL (ref 3.87–5.11)
RDW: 12.4 % (ref 11.5–15.5)
WBC: 7.3 10*3/uL (ref 4.0–10.5)
nRBC: 0 % (ref 0.0–0.2)

## 2021-07-06 LAB — HEPATIC FUNCTION PANEL
ALT: 25 U/L (ref 0–44)
AST: 32 U/L (ref 15–41)
Albumin: 4 g/dL (ref 3.5–5.0)
Alkaline Phosphatase: 64 U/L (ref 38–126)
Bilirubin, Direct: 0.1 mg/dL (ref 0.0–0.2)
Indirect Bilirubin: 0.4 mg/dL (ref 0.3–0.9)
Total Bilirubin: 0.5 mg/dL (ref 0.3–1.2)
Total Protein: 7.2 g/dL (ref 6.5–8.1)

## 2021-07-06 LAB — GLUCOSE, CAPILLARY
Glucose-Capillary: 88 mg/dL (ref 70–99)
Glucose-Capillary: 178 mg/dL — ABNORMAL HIGH (ref 70–99)

## 2021-07-06 LAB — MAGNESIUM: Magnesium: 2.2 mg/dL (ref 1.7–2.4)

## 2021-07-06 LAB — COMPREHENSIVE METABOLIC PANEL
ALT: 24 U/L (ref 0–44)
AST: 29 U/L (ref 15–41)
Albumin: 4 g/dL (ref 3.5–5.0)
Alkaline Phosphatase: 57 U/L (ref 38–126)
Anion gap: 6 (ref 5–15)
BUN: 20 mg/dL (ref 8–23)
CO2: 27 mmol/L (ref 22–32)
Calcium: 9.3 mg/dL (ref 8.9–10.3)
Chloride: 107 mmol/L (ref 98–111)
Creatinine, Ser: 0.97 mg/dL (ref 0.44–1.00)
GFR, Estimated: >60 mL/min (ref >60)
Glucose, Bld: 104 mg/dL — ABNORMAL HIGH (ref 70–99)
Potassium: 3.4 mmol/L — ABNORMAL LOW (ref 3.5–5.1)
Sodium: 140 mmol/L (ref 135–145)
Total Bilirubin: 0.2 mg/dL — ABNORMAL LOW (ref 0.3–1.2)
Total Protein: 6.9 g/dL (ref 6.5–8.1)

## 2021-07-06 LAB — CBC
HCT: 40 % (ref 36.0–46.0)
Hemoglobin: 13.5 g/dL (ref 12.0–15.0)
MCH: 30.9 pg (ref 26.0–34.0)
MCHC: 33.8 g/dL (ref 30.0–36.0)
MCV: 91.5 fL (ref 80.0–100.0)
Platelets: 251 10*3/uL (ref 150–400)
RBC: 4.37 MIL/uL (ref 3.87–5.11)
RDW: 12.5 % (ref 11.5–15.5)
WBC: 7.8 10*3/uL (ref 4.0–10.5)
nRBC: 0 % (ref 0.0–0.2)

## 2021-07-06 LAB — TSH: TSH: 0.884 u[IU]/mL (ref 0.350–4.500)

## 2021-07-06 LAB — CK: Total CK: 703 U/L — ABNORMAL HIGH (ref 38–234)

## 2021-07-06 LAB — RAPID URINE DRUG SCREEN, HOSP PERFORMED
Amphetamines: NOT DETECTED
Barbiturates: NOT DETECTED
Benzodiazepines: NOT DETECTED
Cocaine: NOT DETECTED
Opiates: NOT DETECTED
Tetrahydrocannabinol: NOT DETECTED

## 2021-07-06 LAB — BLOOD GAS, VENOUS
Acid-Base Excess: 5.3 mmol/L — ABNORMAL HIGH (ref 0.0–2.0)
Bicarbonate: 32.6 mmol/L — ABNORMAL HIGH (ref 20.0–28.0)
O2 Saturation: 44.5 %
Patient temperature: 37
pCO2, Ven: 59 mmHg (ref 44–60)
pH, Ven: 7.35 (ref 7.25–7.43)
pO2, Ven: 33 mmHg (ref 32–45)

## 2021-07-06 LAB — BRAIN NATRIURETIC PEPTIDE: B Natriuretic Peptide: 27.4 pg/mL (ref 0.0–100.0)

## 2021-07-06 LAB — RESP PANEL BY RT-PCR (FLU A&B, COVID) ARPGX2
Influenza A by PCR: NEGATIVE
Influenza B by PCR: NEGATIVE
SARS Coronavirus 2 by RT PCR: NEGATIVE

## 2021-07-06 LAB — TROPONIN I (HIGH SENSITIVITY): Troponin I (High Sensitivity): 9 ng/L (ref <18)

## 2021-07-06 LAB — HEMOGLOBIN A1C
Hgb A1c MFr Bld: 5.5 % (ref 4.8–5.6)
Mean Plasma Glucose: 111.15 mg/dL

## 2021-07-06 LAB — PHOSPHORUS: Phosphorus: 3.5 mg/dL (ref 2.5–4.6)

## 2021-07-06 MED ORDER — LOSARTAN POTASSIUM 50 MG PO TABS
100.0000 mg | ORAL_TABLET | Freq: Every day | ORAL | Status: DC
  Administered 2021-07-07 – 2021-07-15 (×9): 100 mg via ORAL
  Filled 2021-07-06 (×10): qty 2

## 2021-07-06 MED ORDER — SODIUM CHLORIDE 0.9% FLUSH
3.0000 mL | Freq: Two times a day (BID) | INTRAVENOUS | Status: DC
  Administered 2021-07-07 – 2021-07-15 (×18): 3 mL via INTRAVENOUS

## 2021-07-06 MED ORDER — INSULIN ASPART 100 UNIT/ML IJ SOLN
0.0000 [IU] | INTRAMUSCULAR | Status: DC
  Administered 2021-07-07 – 2021-07-09 (×8): 1 [IU] via SUBCUTANEOUS
  Administered 2021-07-09: 2 [IU] via SUBCUTANEOUS
  Administered 2021-07-10 (×2): 3 [IU] via SUBCUTANEOUS
  Administered 2021-07-10 (×2): 2 [IU] via SUBCUTANEOUS
  Filled 2021-07-06: qty 0.09

## 2021-07-06 MED ORDER — ATORVASTATIN CALCIUM 80 MG PO TABS
80.0000 mg | ORAL_TABLET | Freq: Every evening | ORAL | Status: DC
  Administered 2021-07-07 – 2021-07-15 (×9): 80 mg via ORAL
  Filled 2021-07-06 (×10): qty 1

## 2021-07-06 MED ORDER — BISACODYL 10 MG RE SUPP
10.0000 mg | Freq: Every day | RECTAL | Status: DC | PRN
Start: 2021-07-06 — End: 2021-07-16

## 2021-07-06 MED ORDER — AMLODIPINE BESYLATE 5 MG PO TABS
5.0000 mg | ORAL_TABLET | Freq: Every day | ORAL | Status: DC
  Administered 2021-07-07 – 2021-07-11 (×5): 5 mg via ORAL
  Filled 2021-07-06 (×5): qty 1

## 2021-07-06 MED ORDER — SODIUM CHLORIDE 0.9 % IV SOLN: 250.0000 mL | INTRAVENOUS | Status: DC | PRN

## 2021-07-06 MED ORDER — POLYETHYLENE GLYCOL 3350 17 G PO PACK: 17.0000 g | PACK | Freq: Every day | ORAL | Status: DC | PRN

## 2021-07-06 MED ORDER — SODIUM CHLORIDE 0.9% FLUSH
3.0000 mL | INTRAVENOUS | Status: DC | PRN
  Administered 2021-07-14: 3 mL via INTRAVENOUS

## 2021-07-06 MED ORDER — INSULIN GLARGINE 100 UNIT/ML ~~LOC~~ SOLN
10.0000 [IU] | Freq: Every day | SUBCUTANEOUS | Status: DC
  Administered 2021-07-07 – 2021-07-09 (×3): 10 [IU] via SUBCUTANEOUS
  Filled 2021-07-06 (×3): qty 0.1

## 2021-07-06 MED ORDER — DULOXETINE HCL 60 MG PO CPEP
60.0000 mg | ORAL_CAPSULE | Freq: Two times a day (BID) | ORAL | Status: DC
  Administered 2021-07-06 – 2021-07-16 (×20): 60 mg via ORAL
  Filled 2021-07-06 (×20): qty 1

## 2021-07-06 MED ORDER — NALOXONE HCL 0.4 MG/ML IJ SOLN: 0.4000 mg | INTRAMUSCULAR | Status: DC | PRN

## 2021-07-06 MED ORDER — POTASSIUM CHLORIDE CRYS ER 20 MEQ PO TBCR
20.0000 meq | EXTENDED_RELEASE_TABLET | Freq: Once | ORAL | Status: AC
  Administered 2021-07-07: 20 meq via ORAL
  Filled 2021-07-06: qty 1

## 2021-07-06 MED ORDER — ACETAMINOPHEN 650 MG RE SUPP: 650.0000 mg | Freq: Four times a day (QID) | RECTAL | Status: DC | PRN

## 2021-07-06 MED ORDER — ACETAMINOPHEN 325 MG PO TABS
650.0000 mg | ORAL_TABLET | Freq: Four times a day (QID) | ORAL | Status: DC | PRN
  Administered 2021-07-09 – 2021-07-16 (×8): 650 mg via ORAL
  Filled 2021-07-06 (×10): qty 2

## 2021-07-06 NOTE — ED Provider Notes (Signed)
[facility] DEPT Provider Note   CSN: [id number] Arrival date & time: 07/06/21  1221     History  Chief Complaint  Patient presents with   Weakness   Fall   Foot Injury    [name] is a 61 y.o. female with history of MS who presents to the ED for evaluation of generalized weakness and more frequent falls over the last several days.  She states she fell today injuring her right knee and her right toe with abrasion over her right toe.  Upon chart review, patient has been following closely with her neurologist who is well aware that patient has been having more frequent falls.  They are currently trialing a change in medications that was initiated 1 month ago.  Patient was also seen 7 days ago in the emergency department for worsening headache and weakness with occasional facial numbness.  She had a normal CT head and then declined MRI.  Patient was discharged home with shared decision making.  On my evaluation, patient is very obtunded, having trouble staying awake.  Tried numerous times to awaken her complete my interview and she would open her eyes before immediately closed and going back to sleep.  I had my attending Dr. Francia Greaves talk to her, and he performed more aggressive stimulation. He was able to get history out of patient including that she has been falling more often, feeling weaker.  She feels more fatigued/tired.   Weakness Fall  Foot Injury     Home Medications Prior to Admission medications   Medication Sig Start Date End Date Taking? Authorizing Provider  Accu-Chek Softclix Lancets lancets Use as directed up to 4 times daily 12/18/20   Mariel Aloe, MD  amantadine (SYMMETREL) 100 MG capsule Take 1 capsule (100 mg total) by mouth 2 (two) times daily. 06/12/21   Sater, Nanine Means, MD  amitriptyline (ELAVIL) 25 MG tablet TAKE 1 TABLET(25 MG) BY MOUTH AT BEDTIME 03/06/21   Sater, Nanine Means, MD  amLODipine (NORVASC) 5 MG tablet Take 1  tablet (5 mg total) by mouth daily. Patient taking differently: Take 5 mg by mouth every evening. 12/18/20 03/18/21  Mariel Aloe, MD  ANTI-DIARRHEAL 2 MG tablet Take 2-4 mg by mouth 4 (four) times daily as needed for diarrhea or loose stools. 07/31/19   [provider]  atorvastatin (LIPITOR) 80 MG tablet Take 1 tablet (80 mg total) by mouth daily. Patient taking differently: Take 80 mg by mouth every evening. 02/24/12   Copland, Gay Filler, MD  Biotin w/ Vitamins C & E (HAIR/SKIN/NAILS PO) Take 3 tablets by mouth in the morning and at bedtime.    [provider]  Blood Glucose Monitoring Suppl (BLOOD GLUCOSE MONITOR SYSTEM) w/Device KIT use as directed 12/18/20   Mariel Aloe, MD  Cholecalciferol (VITAMIN D-3) 125 MCG (5000 UT) TABS Take 5,000 Units by mouth in the morning.    [provider]  dicyclomine (BENTYL) 20 MG tablet Take 20 mg by mouth in the morning and at bedtime. 10/23/19   [provider]  diphenoxylate-atropine (LOMOTIL) 2.5-0.025 MG tablet Take 3 tablets by mouth 2 (two) times daily as needed for diarrhea or loose stools.    [provider]  DULoxetine (CYMBALTA) 60 MG capsule Take 60 mg by mouth 2 (two) times daily.  10/04/19   [provider]  fluticasone (FLONASE) 50 MCG/ACT nasal spray Place 2 sprays into the nose daily. Patient taking differently: Place 2 sprays into  the nose in the morning and at bedtime. 02/24/12   Copland, Gay Filler, MD  glucose blood (ACCU-CHEK GUIDE) test strip Use as instructed up to 4 times daily 12/18/20   Mariel Aloe, MD  Homeopathic Products (LEG CRAMPS PO) Place 3 tablets under the tongue daily as needed (For leg cramps).    [provider]  ibuprofen (ADVIL) 800 MG tablet Take 800 mg by mouth 3 (three) times daily as needed for moderate pain.  09/25/19   [provider]  insulin glargine (LANTUS) 100 UNIT/ML Solostar Pen Inject 40 Units into the skin daily. 12/18/20   Mariel Aloe,  MD  insulin lispro (HUMALOG) 100 UNIT/ML KwikPen Inject 10 Units into the skin 3 (three) times daily with meals. Patient taking differently: Inject 0-6 Units into the skin 3 (three) times daily with meals. Sliding scale insulin 12/18/20   Mariel Aloe, MD  Insulin Pen Needle 32G X 4 MM MISC use as directed 4 times daily 12/18/20   Mariel Aloe, MD  lamoTRIgine (LAMICTAL) 200 MG tablet TAKE 1 TABLET BY MOUTH TWICE DAILY 05/22/21   Sater, Nanine Means, MD  losartan (COZAAR) 100 MG tablet Take 1 tablet (100 mg total) by mouth daily. Patient taking differently: Take 100 mg by mouth at bedtime. 02/24/12   Copland, Gay Filler, MD  MAGNESIUM-OXIDE 400 (241.3 Mg) MG tablet Take 400 mg by mouth in the morning. 10/18/19   [provider]  MELATONIN MAXIMUM STRENGTH 5 MG TABS Take 10-15 mg by mouth at bedtime as needed (sleep). 07/31/19   [provider]  montelukast (SINGULAIR) 10 MG tablet Take 10 mg by mouth at bedtime. 07/31/19   [provider]  NARCAN 4 MG/0.1ML LIQD nasal spray kit Place 1 spray into the nose as directed. overdose 10/11/19   [provider]  Oxcarbazepine (TRILEPTAL) 300 MG tablet Take 1 tablet (300 mg total) by mouth 2 (two) times daily. 06/26/21   Sater, Nanine Means, MD  oxyCODONE (ROXICODONE) 15 MG immediate release tablet Take 15 mg by mouth every 6 (six) hours as needed for pain. 09/11/19   [provider]  TECFIDERA 240 MG CPDR Take 240 mg by mouth 2 (two) times daily. 10/31/19   [provider]  tiZANidine (ZANAFLEX) 2 MG tablet Take 2 mg by mouth 4 (four) times daily. 10/23/19   [provider]  triamterene-hydrochlorothiazide (MAXZIDE-25) 37.5-25 MG tablet Take 1 tablet by mouth every evening. 01/09/21   [provider]  VIBERZI 100 MG TABS Take 100 mg by mouth 2 (two) times daily. 11/07/19   [provider]  vitamin B-12 (CYANOCOBALAMIN) 1000 MCG tablet Take 1,000 mcg by mouth in the morning. 07/31/19   [provider]  XIIDRA 5 % SOLN Place 1 drop into both eyes in the morning and at bedtime.  11/10/19   [provider]  zolpidem (AMBIEN) 10 MG tablet Take 10 mg by mouth at bedtime.  10/28/19   [provider]      Allergies    Patient has no known allergies.    Review of Systems   Review of Systems  Neurological:  Positive for weakness.   Physical Exam Updated Vital Signs BP (!) 176/99    Pulse 80    Temp 97.9 F (36.6 C) (Oral)    Resp 10    SpO2 98%  Physical Exam Vitals and nursing note reviewed.  Constitutional:      General: She is not in acute distress.  Appearance: She is not ill-appearing.     Comments: Patient is somnolent, unable to stay awake for entirety of exam.  HENT:     Head: Atraumatic.  Eyes:     Conjunctiva/sclera: Conjunctivae normal.  Cardiovascular:     Rate and Rhythm: Normal rate and regular rhythm.     Pulses: Normal pulses.     Heart sounds: No murmur heard. Pulmonary:     Effort: Pulmonary effort is normal. No respiratory distress.     Breath sounds: Normal breath sounds.  Abdominal:     General: Abdomen is flat. There is no distension.     Palpations: Abdomen is soft.     Tenderness: There is no abdominal tenderness.  Musculoskeletal:        General: No swelling or tenderness. Normal range of motion.     Cervical back: Normal range of motion.  Skin:    General: Skin is warm and dry.     Capillary Refill: Capillary refill takes less than 2 seconds.     Comments: Small ulcerative appearing lesion on the dorsal right great toe.  No signs of infection.  Legs with numerous well-healed pitting scars.  Neurological:     General: No focal deficit present.     Comments: *on reevaluation, pt was able to stay awake and perform neuro exam*  Diminished grip strength bilaterally.  Sensation intact although trouble differentiating between sharp and light the bilateral upper and lower extremities.  Mild right sided facial droop.    Psychiatric:        Mood and Affect: Mood normal.    ED Results / Procedures / Treatments   Labs (all labs ordered are listed, but only abnormal results are displayed) Labs Reviewed  COMPREHENSIVE METABOLIC PANEL - Abnormal; Notable for the following components:      Result Value   Potassium 3.4 (*)    Glucose, Bld 104 (*)    Total Bilirubin 0.2 (*)    All other components within normal limits  BLOOD GAS, VENOUS - Abnormal; Notable for the following components:   Bicarbonate 32.6 (*)    Acid-Base Excess 5.3 (*)    All other components within normal limits  CBC WITH DIFFERENTIAL/PLATELET  URINALYSIS, ROUTINE W REFLEX MICROSCOPIC  RAPID URINE DRUG SCREEN, HOSP PERFORMED    EKG None  Radiology DG Tibia/Fibula Right  Result Date: 07/06/2021 CLINICAL DATA:  Fall, leg pain EXAM: RIGHT TIBIA AND FIBULA - 2 VIEW COMPARISON:  None. FINDINGS: No acute fracture or dislocation identified. No significant soft tissue abnormality identified. IMPRESSION: No acute fracture identified. Electronically Signed   By: Ofilia Neas M.D.   On: 07/06/2021 14:33   CT Head Wo Contrast  Result Date: 07/06/2021 CLINICAL DATA:  61 year old female with altered mental status. History of multiple sclerosis. EXAM: CT HEAD WITHOUT CONTRAST TECHNIQUE: Contiguous axial images were obtained from the base of the skull through the vertex without intravenous contrast. RADIATION DOSE REDUCTION: This exam was performed according to the departmental dose-optimization program which includes automated exposure control, adjustment of the mA and/or kV according to patient size and/or use of iterative reconstruction technique. COMPARISON:  06/29/2021 head CT and prior studies FINDINGS: Brain: No evidence of acute infarction, hemorrhage, hydrocephalus, extra-axial collection or mass lesion/mass effect. No white matter abnormalities on prior MRs are difficult to visualized on this study. Vascular: No hyperdense vessel or  unexpected calcification. Skull: Normal. Negative for fracture or focal lesion. Sinuses/Orbits: No acute finding. Other: None. IMPRESSION: No evidence of acute intracranial  abnormality. Electronically Signed   By: Margarette Canada M.D.   On: 07/06/2021 14:51   DG Chest Portable 1 View  Result Date: 07/06/2021 CLINICAL DATA:  Weakness. EXAM: PORTABLE CHEST 1 VIEW COMPARISON:  12/10/2020 FINDINGS: 1401 hours. Low lung volumes. The cardio pericardial silhouette is enlarged. There is pulmonary vascular congestion without overt pulmonary edema. Diffuse interstitial opacity suggests edema. No substantial pleural effusion. The visualized bony structures of the thorax show no acute abnormality. IMPRESSION: Low volume film with vascular congestion and probable interstitial pulmonary edema. Electronically Signed   By: Misty Stanley M.D.   On: 07/06/2021 14:37   DG Knee Complete 4 Views Right  Result Date: 07/06/2021 CLINICAL DATA:  Fall, knee pain EXAM: RIGHT KNEE - COMPLETE 4+ VIEW COMPARISON:  None. FINDINGS: No acute fracture or dislocation identified. Moderate degenerative changes at the patellofemoral joint and mild at the medial and lateral femorotibial compartments. Tricompartmental osteophytes. No joint effusion visualized. IMPRESSION: Chronic changes with no acute fracture identified. Electronically Signed   By: Ofilia Neas M.D.   On: 07/06/2021 14:22   DG Foot Complete Right  Result Date: 07/06/2021 CLINICAL DATA:  Fall, foot pain EXAM: RIGHT FOOT COMPLETE - 3+ VIEW COMPARISON:  None. FINDINGS: No acute fracture or dislocation identified. Bones are osteopenic. Old fracture deformity of the fifth metatarsal. Advanced degenerative changes of the first metatarsophalangeal joint with severe joint space narrowing, subchondral sclerosis and marginal osteophytes. Plantar calcaneal spur. IMPRESSION: No acute osseous abnormality identified. Multiple chronic findings as described. Electronically Signed   By:  Ofilia Neas M.D.   On: 07/06/2021 14:21    Procedures Procedures    Medications Ordered in ED Medications - No data to display  ED Course/ Medical Decision Making/ A&P                           Medical Decision Making Amount and/or Complexity of Data Reviewed Labs: ordered. Radiology: ordered.   History:  Per HPI  Initial impression:  This patient presents to the ED for concern of weakness and falls, this involves an extensive number of treatment options, and is a complaint that carries with it a high risk of complications and morbidity.   Concern for stroke, worsening MS, arrhythmia, neoplasm  Patient is obtunded, difficult to awaken for exam.  Dr. Francia Greaves had more success with more aggressive stimulation maneuvers and was able to get a brief history from her.  There was a small wound on the dorsal great toe of the right foot.  No other evidence of injury or infection.  Will obtain CT head, x-rays of the right limb and basic labs.   Lab Tests and EKG:  I Ordered, reviewed, and interpreted labs and EKG.  The pertinent results include:  CMP with mild hypokalemia at 3.4.  Not enough to account for her severe weakness. CBC without leukocytosis or anemia UA normal UDS negative VBG without acidosis or alkalosis although bicarb is slightly elevated at 32.6   Imaging Studies ordered:  I ordered imaging studies including  X-ray of the right knee, tibia/fibula and foot without evidence of fracture or acute traumatic injury CT head without acute abnormality I independently visualized and interpreted imaging and I agree with the radiologist interpretation.    Cardiac Monitoring:  The patient was maintained on a cardiac monitor.  I personally viewed and interpreted the cardiac monitored which showed an underlying rhythm of: NSR   Consultations Obtained:  I requested consultation with  Dr. Theda Sers in neurology,  and discussed lab and imaging findings as well as pertinent  plan - they recommend: We discussed patient's presenting symptoms and lab and imaging findings, Dr. Theda Sers in the agreement that patient does need an MRI.  Suspicion is leaning more toward MS flare versus stroke.  He recommends patient be admitted for sedated MRI with likely MS flare treatment following.  He notes that any treatment should be withheld until the MRI to avoid affecting imaging results.   ED Course: On reevaluation, patient is more alert.  She is able to answer questions and complete full exam.  There is some concern for right-sided facial droop..  She is complaining of bilateral upper and lower extremity numbness.  Sensation is intact bilaterally however she has difficulty differentiating between sharp and light.  Diminished grip strength bilaterally.  Diminished leg strength bilaterally.  She notes the symptoms of weakness really worsened in the last 3 to 4 days.  She states that this sometimes happens when she has an MS flare and resolves with Solu-Medrol.  I would really like to get an MRI on her and patient agrees, however she notes that she is unable to receive an MRI without anesthesia due to claustrophobia.   After speaking with Dr. Theda Sers in neurology, he agrees patient has been admitted for MRI.  Full consult note as above.  Patient care handed off to [facility] at shift change who will call hospitalist to admit patient for sedated MRI with subsequent MS treatment.  Final Clinical Impression(s) / ED Diagnoses Final diagnoses:  Generalized weakness    Rx / DC Orders ED Discharge Orders     None         Rodena Piety 07/06/21 1830    Valarie Merino, MD 07/09/21 2332

## 2021-07-06 NOTE — Assessment & Plan Note (Signed)
Restart Cozaar

## 2021-07-06 NOTE — Subjective & Objective (Signed)
Hx of MS followed by neurology several days of worsening wekness Numbness in her feet Fall today and had injured her arms

## 2021-07-06 NOTE — Assessment & Plan Note (Signed)
Restart home medications when able to tolerate °

## 2021-07-06 NOTE — ED Notes (Signed)
[facility] called for transport. 

## 2021-07-06 NOTE — Assessment & Plan Note (Signed)
Patient appears to be sedated Narcan as needed. VBG with no evidence of hypercarbia.  Hold off on opioids for tonight

## 2021-07-06 NOTE — Assessment & Plan Note (Signed)
Chronic stable follow-up as an outpatient 

## 2021-07-06 NOTE — H&P (Signed)
[name] MEB:[id number] DOB: 21-Apr-1961 DOA: 07/06/2021     PCP: Beverley Fiedler, FNP   Outpatient Specialists:  CARDS:   Dr. Edson Snowball   NEurology   Dr. Felecia Shelling    Patient arrived to ER on 07/06/21 at 1221 Referred by Attending Valarie Merino, MD   Patient coming from:    home Lives  With family    Chief Complaint:   Chief Complaint  Patient presents with   Weakness   Fall   Foot Injury    HPI: [name] is a 61 y.o. female with medical history significant of  MS, HTN , HLD    Presented with fall and bilateral leg numbness tingling for the past 4 days Hx of MS followed by neurology several days of worsening wekness Numbness in her feet Fall today and had injured her arms Denies SOB no CP no fever Reports headache No tobacco no EtOH has been taking her meds Reports feeling sleepy No burning with urination     Initial COVID TEST  in house  PCR testing  Pending  Lab Results  Component Value Date   [place] NEGATIVE 12/10/2020     Regarding pertinent Chronic problems:     Hyperlipidemia -  on statins Lipitor Lipid Panel     Component Value Date/Time   CHOL (H) 02/19/2009 0325    237        ATP III CLASSIFICATION:  <200     mg/dL   Desirable  200-239  mg/dL   Borderline High  >=240    mg/dL   High          TRIG 249 (H) 02/19/2009 0325   HDL 52 02/19/2009 0325   CHOLHDL 4.6 02/19/2009 0325   VLDL 50 (H) 02/19/2009 0325   LDLCALC (H) 02/19/2009 0325    135        Total Cholesterol/HDL:CHD Risk Coronary Heart Disease Risk Table                     Men   Women  1/2 Average Risk   3.4   3.3  Average Risk       5.0   4.4  2 X Average Risk   9.6   7.1  3 X Average Risk  23.4   11.0        Use the calculated Patient Ratio above and the CHD Risk Table to determine the patient's CHD Risk.        ATP III CLASSIFICATION (LDL):  <100     mg/dL   Optimal  100-129  mg/dL   Near or Above                    Optimal  130-159  mg/dL    Borderline  160-189  mg/dL   High  >190     mg/dL   Very High     HTN on  norvasc cozaar      DM 2 -  Lab Results  Component Value Date   HGBA1C 10.6 (H) 12/10/2020   on insulin Lantus 40 units and sliding scale         Morbid obesity-   BMI Readings from Last 1 Encounters:  06/12/21 46.06 kg/m      While in ER:    Noted to be lethargic, BP up to 187/99   Ordered  CT HEAD   NON acute  CXR - probable interstitial pulmonary edema.  [place] knee foot no frx  Following Medications were ordered in ER: Medications - No data to display  _______________________________________________________ ER Provider Called:  NEurology    Dr. Theda Sers They Recommend admit to medicine   Will see in AM      ED Triage Vitals  Enc Vitals Group     BP 07/06/21 1230 128/82     Pulse Rate 07/06/21 1230 86     Resp 07/06/21 1230 18     Temp 07/06/21 1230 97.9 F (36.6 C)     Temp Source 07/06/21 1230 Oral     SpO2 07/06/21 1230 99 %     Weight --      Height --      Head Circumference --      Peak Flow --      Pain Score 07/06/21 1231 5     Pain Loc --      Pain Edu? --      Excl. in [place]? --   TMAX(24)@     _________________________________________ Significant initial  Findings: Abnormal Labs Reviewed  COMPREHENSIVE METABOLIC PANEL - Abnormal; Notable for the following components:      Result Value   Potassium 3.4 (*)    Glucose, Bld 104 (*)    Total Bilirubin 0.2 (*)    All other components within normal limits  BLOOD GAS, VENOUS - Abnormal; Notable for the following components:   Bicarbonate 32.6 (*)    Acid-Base Excess 5.3 (*)    All other components within normal limits      ECG: Ordered Personally reviewed by me showing: HR : 78 Rhythm:  NSR,   Ischemic changes*nonspecific changes,   QTC 463     The recent clinical data is shown below. Vitals:   07/06/21 1700 07/06/21 1730 07/06/21 1800 07/06/21 1830  BP: (!) 141/83 (!) 176/99 (!) 185/99 (!) 189/113  Pulse:  77 80 84 90  Resp: 10 10 (!) 9 19  Temp:      TempSrc:      SpO2: 96% 98% 100% 96%      WBC     Component Value Date/Time   WBC 7.3 07/06/2021 1343   LYMPHSABS 1.4 07/06/2021 1343   LYMPHSABS 2.4 11/13/2020 1335   MONOABS 0.5 07/06/2021 1343   EOSABS 0.0 07/06/2021 1343   EOSABS 0.0 11/13/2020 1335   BASOSABS 0.0 07/06/2021 1343   BASOSABS 0.0 11/13/2020 1335     Lactic Acid, Venous    Component Value Date/Time   LATICACIDVEN 1.1 12/21/2020 1433      UA   no evidence of UTI      Urine analysis:    Component Value Date/Time   COLORURINE YELLOW 07/06/2021 1640   APPEARANCEUR CLEAR 07/06/2021 1640   LABSPEC 1.012 07/06/2021 1640   PHURINE 6.0 07/06/2021 1640   GLUCOSEU NEGATIVE 07/06/2021 1640   HGBUR NEGATIVE 07/06/2021 1640   HGBUR negative 12/20/2007 1054   BILIRUBINUR NEGATIVE 07/06/2021 1640   BILIRUBINUR neg 02/24/2012 0905   KETONESUR NEGATIVE 07/06/2021 1640   PROTEINUR NEGATIVE 07/06/2021 1640   UROBILINOGEN 1.0 03/01/2012 1739   NITRITE NEGATIVE 07/06/2021 1640   LEUKOCYTESUR NEGATIVE 07/06/2021 1640    Results for orders placed or performed during the hospital encounter of 07/06/21  Resp Panel by RT-PCR (Flu A&B, Covid) Nasopharyngeal Swab     Status: None   Collection Time: 07/06/21  6:36 PM   Specimen: Nasopharyngeal Swab; Nasopharyngeal(NP) swabs in vial transport medium  Result Value Ref Range Status  SARS Coronavirus 2 by RT PCR NEGATIVE NEGATIVE Final         Influenza A by PCR NEGATIVE NEGATIVE Final   Influenza B by PCR NEGATIVE NEGATIVE Final          _______________________________________________ Hospitalist was called for admission for  possible MS exacerbation  The following Work up has been ordered so far:  Orders Placed This Encounter  Procedures   Resp Panel by RT-PCR (Flu A&B, Covid) Nasopharyngeal Swab   DG Foot Complete Right   DG Knee Complete 4 Views Right   DG Tibia/Fibula Right   CT Head Wo Contrast   DG Chest  Portable 1 View   Comprehensive metabolic panel   CBC with Differential   Urinalysis, Routine w reflex microscopic   Rapid urine drug screen (hospital performed)   Blood gas, venous (at [facility] and [facility], not at [facility])   Consult to neurology   Consult to hospitalist   ED EKG     OTHER Significant initial  Findings:  labs showing:    Recent Labs  Lab 07/06/21 1343  NA 140  K 3.4*  CO2 27  GLUCOSE 104*  BUN 20  CREATININE 0.97  CALCIUM 9.3    Cr    stable,   Lab Results  Component Value Date   CREATININE 0.97 07/06/2021   CREATININE 0.83 06/29/2021   CREATININE 0.70 02/18/2021    Recent Labs  Lab 07/06/21 1343  AST 29  ALT 24  ALKPHOS 57  BILITOT 0.2*  PROT 6.9  ALBUMIN 4.0   Lab Results  Component Value Date   CALCIUM 9.3 07/06/2021   PHOS 3.8 02/18/2009    Plt: Lab Results  Component Value Date   PLT 236 07/06/2021     COVID-19 Labs  No results for input(s): DDIMER, FERRITIN, LDH, CRP in the last 72 hours.  Lab Results  Component Value Date   SARSCOV2NAA NEGATIVE 12/10/2020     Venous  Blood Gas result:  pH 7.35  pCO2  59 ABG    Component Value Date/Time   PHART 7.441 12/10/2020 0357   PCO2ART 39.2 12/10/2020 0357   PO2ART 62 (L) 12/10/2020 0357   HCO3 32.6 (H) 07/06/2021 1736   TCO2 28 12/10/2020 0357   O2SAT 44.5 07/06/2021 1736         Recent Labs  Lab 07/06/21 1343  WBC 7.3  NEUTROABS 5.3  HGB 12.6  HCT 37.5  MCV 90.6  PLT 236    HG/HCT   stable,       Component Value Date/Time   HGB 12.6 07/06/2021 1343   HGB 15.4 11/13/2020 1335   HCT 37.5 07/06/2021 1343   HCT 44.5 11/13/2020 1335   MCV 90.6 07/06/2021 1343   MCV 87 11/13/2020 1335      Cardiac Panel (last 3 results) No results for input(s): CKTOTAL, CKMB, TROPONINI, RELINDX in the last 72 hours.  .car BNP (last 3 results) Recent Labs    12/10/20 0300  BNP 11.2      DM  labs:  HbA1C: Recent Labs    12/10/20 0822  HGBA1C 10.6*       CBG (last 3)   No results for input(s): GLUCAP in the last 72 hours.    Cultures:    Component Value Date/Time   SDES ABSCESS 12/14/2020 1800   SPECREQUEST RIGHT LOWER LEG SPEC A 12/14/2020 1800   CULT  12/14/2020 1800    RARE METHICILLIN RESISTANT STAPHYLOCOCCUS AUREUS NO ANAEROBES ISOLATED Performed at  [facility], [place] [address], [place], [place] [address]    REPTSTATUS 12/19/2020 FINAL 12/14/2020 1800     Radiological Exams on Admission: DG Tibia/Fibula Right  Result Date: 07/06/2021 CLINICAL DATA:  Fall, leg pain EXAM: RIGHT TIBIA AND FIBULA - 2 VIEW COMPARISON:  None. FINDINGS: No acute fracture or dislocation identified. No significant soft tissue abnormality identified. IMPRESSION: No acute fracture identified. Electronically Signed   By: Ofilia Neas M.D.   On: 07/06/2021 14:33   CT Head Wo Contrast  Result Date: 07/06/2021 CLINICAL DATA:  61 year old female with altered mental status. History of multiple sclerosis. EXAM: CT HEAD WITHOUT CONTRAST TECHNIQUE: Contiguous axial images were obtained from the base of the skull through the vertex without intravenous contrast. RADIATION DOSE REDUCTION: This exam was performed according to the departmental dose-optimization program which includes automated exposure control, adjustment of the mA and/or kV according to patient size and/or use of iterative reconstruction technique. COMPARISON:  06/29/2021 head CT and prior studies FINDINGS: Brain: No evidence of acute infarction, hemorrhage, hydrocephalus, extra-axial collection or mass lesion/mass effect. No white matter abnormalities on prior MRs are difficult to visualized on this study. Vascular: No hyperdense vessel or unexpected calcification. Skull: Normal. Negative for fracture or focal lesion. Sinuses/Orbits: No acute finding. Other: None. IMPRESSION: No evidence of acute intracranial abnormality. Electronically Signed   By: Margarette Canada M.D.   On: 07/06/2021 14:51   DG Chest Portable 1  View  Result Date: 07/06/2021 CLINICAL DATA:  Weakness. EXAM: PORTABLE CHEST 1 VIEW COMPARISON:  12/10/2020 FINDINGS: 1401 hours. Low lung volumes. The cardio pericardial silhouette is enlarged. There is pulmonary vascular congestion without overt pulmonary edema. Diffuse interstitial opacity suggests edema. No substantial pleural effusion. The visualized bony structures of the thorax show no acute abnormality. IMPRESSION: Low volume film with vascular congestion and probable interstitial pulmonary edema. Electronically Signed   By: Misty Stanley M.D.   On: 07/06/2021 14:37   DG Knee Complete 4 Views Right  Result Date: 07/06/2021 CLINICAL DATA:  Fall, knee pain EXAM: RIGHT KNEE - COMPLETE 4+ VIEW COMPARISON:  None. FINDINGS: No acute fracture or dislocation identified. Moderate degenerative changes at the patellofemoral joint and mild at the medial and lateral femorotibial compartments. Tricompartmental osteophytes. No joint effusion visualized. IMPRESSION: Chronic changes with no acute fracture identified. Electronically Signed   By: Ofilia Neas M.D.   On: 07/06/2021 14:22   DG Foot Complete Right  Result Date: 07/06/2021 CLINICAL DATA:  Fall, foot pain EXAM: RIGHT FOOT COMPLETE - 3+ VIEW COMPARISON:  None. FINDINGS: No acute fracture or dislocation identified. Bones are osteopenic. Old fracture deformity of the fifth metatarsal. Advanced degenerative changes of the first metatarsophalangeal joint with severe joint space narrowing, subchondral sclerosis and marginal osteophytes. Plantar calcaneal spur. IMPRESSION: No acute osseous abnormality identified. Multiple chronic findings as described. Electronically Signed   By: Ofilia Neas M.D.   On: 07/06/2021 14:21   _______________________________________________________________________________________________________ Latest  Blood pressure (!) 189/113, pulse 90, temperature 97.9 F (36.6 C), temperature source Oral, resp. rate 19, SpO2 96  %.   Vitals  labs and radiology finding personally reviewed  Review of Systems:    Pertinent positives include:  localizing neurological complaints,  tingling  Constitutional:  No weight loss, night sweats, Fevers, chills, fatigue, weight loss  HEENT:  No headaches, Difficulty swallowing,Tooth/dental problems,Sore throat,  No sneezing, itching, ear ache, nasal congestion, post nasal drip,  Cardio-vascular:  No chest pain, Orthopnea, PND, anasarca, dizziness, palpitations.no Bilateral lower extremity  swelling  GI:  No heartburn, indigestion, abdominal pain, nausea, vomiting, diarrhea, change in bowel habits, loss of appetite, melena, blood in stool, hematemesis Resp:  no shortness of breath at rest. No dyspnea on exertion, No excess mucus, no productive cough, No non-productive cough, No coughing up of blood.No change in color of mucus.No wheezing. Skin:  no rash or lesions. No jaundice GU:  no dysuria, change in color of urine, no urgency or frequency. No straining to urinate.  No flank pain.  Musculoskeletal:  No joint pain or no joint swelling. No decreased range of motion. No back pain.  Psych:  No change in mood or affect. No depression or anxiety. No memory loss.  Neuro: no , no weakness, no double vision, no gait abnormality, no slurred speech, no confusion  All systems reviewed and apart from [place] all are negative _______________________________________________________________________________________________ Past Medical History:   Past Medical History:  Diagnosis Date   Diabetes mellitus    Hypertension    Multiple sclerosis exacerbation ([place])    Neuropathy       Past Surgical History:  Procedure Laterality Date   ABDOMINAL HYSTERECTOMY     partial   I & D EXTREMITY Right 12/14/2020   Procedure: IRRIGATION AND DEBRIDEMENT RIGHT LOWER EXTREMITY;  Surgeon: Rod Can, MD;  Location: [facility];  Service: Orthopedics;  Laterality: Right;   JOINT REPLACEMENT      LTK   RADIOLOGY WITH ANESTHESIA N/A 02/18/2021   Procedure: MRI WITH ANESTHESIA CERVICAL SPINE WITH AND WITHOUT CONTRAST AND BRAIN WITH AND WITHOUT CONTRAST;  Surgeon: Radiologist, Medication, MD;  Location: [facility];  Service: Radiology;  Laterality: N/A;   SACRAL NERVE STIMULATOR PLACEMENT     [employer]   TUBAL LIGATION      Social History:  Ambulatory   independently      reports that she has been smoking cigarettes. She has a 3.00 pack-year smoking history. She has never used smokeless tobacco. She reports that she does not drink alcohol and does not use drugs.     Family History:   Family History  Problem Relation Age of Onset   Diabetes Mother    Hypertension Mother    Stroke Father    ______________________________________________________________________________________________ Allergies: No Known Allergies   Prior to Admission medications   Medication Sig Start Date End Date Taking? Authorizing Provider  Accu-Chek Softclix Lancets lancets Use as directed up to 4 times daily 12/18/20   Mariel Aloe, MD  amantadine (SYMMETREL) 100 MG capsule Take 1 capsule (100 mg total) by mouth 2 (two) times daily. 06/12/21   Sater, Nanine Means, MD  amitriptyline (ELAVIL) 25 MG tablet TAKE 1 TABLET(25 MG) BY MOUTH AT BEDTIME 03/06/21   Sater, Nanine Means, MD  amLODipine (NORVASC) 5 MG tablet Take 1 tablet (5 mg total) by mouth daily. Patient taking differently: Take 5 mg by mouth every evening. 12/18/20 03/18/21  Mariel Aloe, MD  ANTI-DIARRHEAL 2 MG tablet Take 2-4 mg by mouth 4 (four) times daily as needed for diarrhea or loose stools. 07/31/19   [provider]  atorvastatin (LIPITOR) 80 MG tablet Take 1 tablet (80 mg total) by mouth daily. Patient taking differently: Take 80 mg by mouth every evening. 02/24/12   Copland, Gay Filler, MD  Biotin w/ Vitamins C & E (HAIR/SKIN/NAILS PO) Take 3 tablets by mouth in the morning and at bedtime.    [provider]  Blood Glucose  Monitoring Suppl (BLOOD GLUCOSE MONITOR SYSTEM) w/Device KIT use as directed  12/18/20   Mariel Aloe, MD  Cholecalciferol (VITAMIN D-3) 125 MCG (5000 UT) TABS Take 5,000 Units by mouth in the morning.    [provider]  dicyclomine (BENTYL) 20 MG tablet Take 20 mg by mouth in the morning and at bedtime. 10/23/19   [provider]  diphenoxylate-atropine (LOMOTIL) 2.5-0.025 MG tablet Take 3 tablets by mouth 2 (two) times daily as needed for diarrhea or loose stools.    [provider]  DULoxetine (CYMBALTA) 60 MG capsule Take 60 mg by mouth 2 (two) times daily.  10/04/19   [provider]  fluticasone (FLONASE) 50 MCG/ACT nasal spray Place 2 sprays into the nose daily. Patient taking differently: Place 2 sprays into the nose in the morning and at bedtime. 02/24/12   Copland, Gay Filler, MD  glucose blood (ACCU-CHEK GUIDE) test strip Use as instructed up to 4 times daily 12/18/20   Mariel Aloe, MD  Homeopathic Products (LEG CRAMPS PO) Place 3 tablets under the tongue daily as needed (For leg cramps).    [provider]  ibuprofen (ADVIL) 800 MG tablet Take 800 mg by mouth 3 (three) times daily as needed for moderate pain.  09/25/19   [provider]  insulin glargine (LANTUS) 100 UNIT/ML Solostar Pen Inject 40 Units into the skin daily. 12/18/20   Mariel Aloe, MD  insulin lispro (HUMALOG) 100 UNIT/ML KwikPen Inject 10 Units into the skin 3 (three) times daily with meals. Patient taking differently: Inject 0-6 Units into the skin 3 (three) times daily with meals. Sliding scale insulin 12/18/20   Mariel Aloe, MD  Insulin Pen Needle 32G X 4 MM MISC use as directed 4 times daily 12/18/20   Mariel Aloe, MD  lamoTRIgine (LAMICTAL) 200 MG tablet TAKE 1 TABLET BY MOUTH TWICE DAILY 05/22/21   Sater, Nanine Means, MD  losartan (COZAAR) 100 MG tablet Take 1 tablet (100 mg total) by mouth daily. Patient taking differently: Take 100 mg by mouth at bedtime.  02/24/12   Copland, Gay Filler, MD  MAGNESIUM-OXIDE 400 (241.3 Mg) MG tablet Take 400 mg by mouth in the morning. 10/18/19   [provider]  MELATONIN MAXIMUM STRENGTH 5 MG TABS Take 10-15 mg by mouth at bedtime as needed (sleep). 07/31/19   [provider]  montelukast (SINGULAIR) 10 MG tablet Take 10 mg by mouth at bedtime. 07/31/19   [provider]  NARCAN 4 MG/0.1ML LIQD nasal spray kit Place 1 spray into the nose as directed. overdose 10/11/19   [provider]  Oxcarbazepine (TRILEPTAL) 300 MG tablet Take 1 tablet (300 mg total) by mouth 2 (two) times daily. 06/26/21   Sater, Nanine Means, MD  oxyCODONE (ROXICODONE) 15 MG immediate release tablet Take 15 mg by mouth every 6 (six) hours as needed for pain. 09/11/19   [provider]  TECFIDERA 240 MG CPDR Take 240 mg by mouth 2 (two) times daily. 10/31/19   [provider]  tiZANidine (ZANAFLEX) 2 MG tablet Take 2 mg by mouth 4 (four) times daily. 10/23/19   [provider]  triamterene-hydrochlorothiazide (MAXZIDE-25) 37.5-25 MG tablet Take 1 tablet by mouth every evening. 01/09/21   [provider]  VIBERZI 100 MG TABS Take 100 mg by mouth 2 (two) times daily. 11/07/19   [provider]  vitamin B-12 (CYANOCOBALAMIN) 1000 MCG tablet Take 1,000 mcg by mouth in the morning. 07/31/19   [provider]  XIIDRA 5 % SOLN Place 1 drop  into both eyes in the morning and at bedtime.  11/10/19   [provider]  zolpidem (AMBIEN) 10 MG tablet Take 10 mg by mouth at bedtime.  10/28/19   [provider]    ___________________________________________________________________________________________________ Physical Exam: Vitals with BMI 07/06/2021 07/06/2021 07/06/2021  Height - - -  Weight - - -  BMI - - -  Systolic 494 496 759  Diastolic 163 99 99  Pulse 90 84 80     1. General:  in No  Acute distress   Chronically ill   -appearing 2. Psychological:   somnolent but Oriented 3. Head/ENT:   Moist  Mucous Membranes                          Head Non traumatic, neck supple                           Poor Dentition 4. SKIN: normal  Skin turgor,  Skin clean Dry and intact no rash 5. Heart: Regular rate and rhythm no  Murmur, no Rub or gallop 6. Lungs:   no wheezes or crackles   7. Abdomen: Soft,  non-tender, Non distended   obese  bowel sounds present 8. Lower extremities: no clubbing, cyanosis, no  edema 9. Neurologically  strength 5 out of 5 in all 4 extremities cranial nerves II through XII intact patient not fully cooperative with exam secondary to decreased effort 10. MSK: Normal range of motion    Chart has been reviewed  ______________________________________________________________________________________________  Assessment/Plan 61 y.o. female with medical history significant of  MS, HTN , HLD   Admitted for   possible multiple sclerosis exacerbation  Present on Admission:  Multiple sclerosis ([place])  Diabetic peripheral neuropathy ([place])  DM (diabetes mellitus), type 2 with complications (HCC)  Obesity, Class III, BMI 40-49.9 (morbid obesity) (HCC)  Essential hypertension  Fluid overload  Chronic pain     Multiple sclerosis (HCC) Suspected MS fair Needs MRI brain cervical and thoracic spine needs sedation As per request of Dr. Theda Sers First get MRI if postive need steroids Case discussed with anesthesiology and posted for tomorrow    DM (diabetes mellitus), type 2 with complications ([place]) Order sliding scale Given poor p.o. intake and relatively low blood sugars order Lantus at 10 units can increase as needed  Obesity, Class III, BMI 40-49.9 (morbid obesity) ([place]) Chronic stable follow-up as an outpatient  Diabetic peripheral neuropathy ([place]) Restart home medications when able to tolerate  Essential hypertension Restart Cozaar  Fluid overload Chest x-ray showed interstitial edema.  Patient denies history of  CHF.  Obtain echogram to further evaluate.  Patient reports significant urine output when lying flat.  Not on oxygen continue to monitor may need cardiology consult if evidence of new CHF noted  Chronic pain Patient appears to be sedated Narcan as needed. VBG with no evidence of hypercarbia.  Hold off on opioids for tonight   Other plan as per orders.  DVT prophylaxis:  SCD       Code Status:    Code Status: Prior FULL CODE  as per patient   I had personally discussed CODE STATUS with patient   Family Communication:   Family not at  Bedside    Disposition Plan:     Following barriers for discharge:  Electrolytes corrected                                                       Will need consultants to evaluate patient prior to discharge                        Would benefit from PT/OT eval prior to DC  Ordered                                       Consults called: neurology and anesthesiology are aware  Admission status:  ED Disposition     ED Disposition  [place]: [place] [[id number]]  Level of Care: Progressive [102]  Admit to Progressive based on following criteria: NEUROLOGICAL AND NEUROSURGICAL complex patients with significant risk of instability, who do not meet ICU criteria, yet require close observation or frequent assessment (< / = every 2 - 4 hours) with medical / nursing intervention.  May place patient in observation at [facility] or [place] if equivalent level of care is available:: No  Covid Evaluation: Asymptomatic Screening Protocol (No Symptoms)  Diagnosis: Multiple sclerosis ([place]) [340.ICD-9-CM]  Admitting Physician: Toy Baker [[id number]]  Attending Physician: Toy Baker [[id number]]           Obs      Level of care            progressive tele indefinitely please discontinue once patient no longer qualifies COVID-19 Labs    Lab Results  Component  Value Date   [place] 07/06/2021     Precautions: admitted as   Covid Negative      Anastassia Doutova 07/06/2021, 10:42 PM    [facility]     after 2 AM please page floor coverage PA If 7AM-7PM, please contact the day team taking care of the patient using [email or web address]   Patient was evaluated in the context of the global COVID-19 pandemic, which necessitated consideration that the patient might be at risk for infection with the SARS-CoV-2 virus that causes COVID-19. Institutional protocols and algorithms that pertain to the evaluation of patients at risk for COVID-19 are in a state of rapid change based on information released by regulatory bodies including the CDC and federal and state organizations. These policies and algorithms were followed during the patient's care.

## 2021-07-06 NOTE — ED Notes (Signed)
Attempted to call floor to give report, unsuccessful at this time.

## 2021-07-06 NOTE — ED Provider Notes (Signed)
6:33 PM signout from previous provider, [facility].  Patient here with worsening generalized weakness, increase in falls, worsening numbness in the arms and legs bilaterally in setting of multiple sclerosis.  Previous provider has spoken with neurology.  They recommended admission, obtain MRI when able, treatment of MS flare after MRI.   Patient seen, agrees with admit.  She feels like this is consistent with previous flares of multiple sclerosis.  BP (!) 185/99    Pulse 84    Temp 97.9 F (36.6 C) (Oral)    Resp (!) 9    SpO2 100%   7:09 PM Spoke with Dr. Roel Cluck regarding admission. Will try to touch base with Dr. Theda Sers in regards to what MRI recommended and [facility] vs [facility] admit.   8:05 PM Discussed case with Dr. Lorrin Goodell. Discussed case, reccs MRI brain, c-spine, t-spine w and wo contrast. Unknown if sedated MRI can be performed at [facility].    Carlisle Cater, PA-C 07/06/21 2032    Daleen Bo, MD 07/06/21 2237

## 2021-07-06 NOTE — Assessment & Plan Note (Addendum)
Suspected MS fair Needs MRI brain cervical and thoracic spine needs sedation As per request of Dr. Thomasena Edis First get MRI if postive need steroids Case discussed with anesthesiology and posted for tomorrow

## 2021-07-06 NOTE — Assessment & Plan Note (Signed)
Chest x-ray showed interstitial edema.  Patient denies history of CHF.  Obtain echogram to further evaluate.  Patient reports significant urine output when lying flat.  Not on oxygen continue to monitor may need cardiology consult if evidence of new CHF noted

## 2021-07-06 NOTE — ED Notes (Signed)
ED TO INPATIENT HANDOFF REPORT  ED Nurse Name and Phone #:   S Name/Age/Gender [name] 61 y.o. female Room/Bed: WA11/WA11  Code Status   Code Status: Prior  Home/SNF/Other Home Patient oriented to: self, place, time, and situation Is this baseline? Yes   Triage Complete: Triage complete  Chief Complaint Multiple sclerosis (HCC) [G35]  Triage Note EMS reports from home, called out for generalized weakness and falls over last couple days, fall today and c/o right knee and toe pain with small abrasion on great toe. Hx of MS.  BP 142/68 HR 86 RR 20 Sp02 100 RA CBG 155   Allergies No Known Allergies  Level of Care/Admitting Diagnosis ED Disposition     ED Disposition  Admit   Condition  --   Comment  Hospital Area: [facility] [[id number]]  Level of Care: Progressive [102]  Admit to Progressive based on following criteria: NEUROLOGICAL AND NEUROSURGICAL complex patients with significant risk of instability, who do not meet ICU criteria, yet require close observation or frequent assessment (< / = every 2 - 4 hours) with medical / nursing intervention.  May place patient in observation at [facility] or [facility] if equivalent level of care is available:: No  Covid Evaluation: Asymptomatic Screening Protocol (No Symptoms)  Diagnosis: Multiple sclerosis (HCC) [340.ICD-9-CM]  Admitting Physician: Therisa Doyne [[id number]]  Attending Physician: Therisa Doyne [[id number]]          B Medical/Surgery History Past Medical History:  Diagnosis Date   Diabetes mellitus    Hypertension    Multiple sclerosis exacerbation (HCC)    Neuropathy    Past Surgical History:  Procedure Laterality Date   ABDOMINAL HYSTERECTOMY     partial   I & D EXTREMITY Right 12/14/2020   Procedure: IRRIGATION AND DEBRIDEMENT RIGHT LOWER EXTREMITY;  Surgeon: Samson Frederic, MD;  Location: [facility] OR;  Service: Orthopedics;  Laterality: Right;   JOINT REPLACEMENT     LTK    RADIOLOGY WITH ANESTHESIA N/A 02/18/2021   Procedure: MRI WITH ANESTHESIA CERVICAL SPINE WITH AND WITHOUT CONTRAST AND BRAIN WITH AND WITHOUT CONTRAST;  Surgeon: Radiologist, Medication, MD;  Location: [facility] OR;  Service: Radiology;  Laterality: N/A;   SACRAL NERVE STIMULATOR PLACEMENT     [employer]   TUBAL LIGATION       A IV Location/Drains/Wounds Patient Lines/Drains/Airways Status     Active Line/Drains/Airways     Name Placement date Placement time Site Days   Peripheral IV 07/06/21 Anterior;Right Forearm 07/06/21  1343  Forearm  less than 1   External Urinary Catheter 12/11/20  0030  --  207   External Urinary Catheter 07/06/21  1600  --  less than 1   Wound / Incision (Open or Dehisced) 12/11/20 Diabetic ulcer Pretibial Right;Medial;Lower open round wound with closed edges and underlying tunneling 12/11/20  0030  Pretibial  207            Intake/Output Last 24 hours No intake or output data in the 24 hours ending 07/06/21 2026  Labs/Imaging Results for orders placed or performed during the hospital encounter of 07/06/21 (from the past 48 hour(s))  Comprehensive metabolic panel     Status: Abnormal   Collection Time: 07/06/21  1:43 PM  Result Value Ref Range   Sodium 140 135 - 145 mmol/L   Potassium 3.4 (L) 3.5 - 5.1 mmol/L   Chloride 107 98 - 111 mmol/L   CO2 27 22 - 32 mmol/L   Glucose, Bld  104 (H) 70 - 99 mg/dL    Comment: Glucose reference range applies only to samples taken after fasting for at least 8 hours.   BUN 20 8 - 23 mg/dL   Creatinine, Ser 6.96 0.44 - 1.00 mg/dL   Calcium 9.3 8.9 - 29.5 mg/dL   Total Protein 6.9 6.5 - 8.1 g/dL   Albumin 4.0 3.5 - 5.0 g/dL   AST 29 15 - 41 U/L   ALT 24 0 - 44 U/L   Alkaline Phosphatase 57 38 - 126 U/L   Total Bilirubin 0.2 (L) 0.3 - 1.2 mg/dL   GFR, Estimated >28 >41 mL/min    Comment: (NOTE) Calculated using the CKD-EPI Creatinine Equation (2021)    Anion gap 6 5 - 15    Comment: Performed at [facility], [address] [place], [place] [address]  CBC with Differential     Status: None   Collection Time: 07/06/21  1:43 PM  Result Value Ref Range   WBC 7.3 4.0 - 10.5 K/uL   RBC 4.14 3.87 - 5.11 MIL/uL   Hemoglobin 12.6 12.0 - 15.0 g/dL   HCT 10.2 72.5 - 36.6 %   MCV 90.6 80.0 - 100.0 fL   MCH 30.4 26.0 - 34.0 pg   MCHC 33.6 30.0 - 36.0 g/dL   RDW 44.0 34.7 - 42.5 %   Platelets 236 150 - 400 K/uL   nRBC 0.0 0.0 - 0.2 %   Neutrophils Relative % 73 %   Neutro Abs 5.3 1.7 - 7.7 K/uL   Lymphocytes Relative 20 %   Lymphs Abs 1.4 0.7 - 4.0 K/uL   Monocytes Relative 7 %   Monocytes Absolute 0.5 0.1 - 1.0 K/uL   Eosinophils Relative 0 %   Eosinophils Absolute 0.0 0.0 - 0.5 K/uL   Basophils Relative 0 %   Basophils Absolute 0.0 0.0 - 0.1 K/uL   Immature Granulocytes 0 %   Abs Immature Granulocytes 0.03 0.00 - 0.07 K/uL    Comment: Performed at [facility] [address] [place], [place] [address]  Urinalysis, Routine w reflex microscopic Urine, Clean Catch     Status: None   Collection Time: 07/06/21  4:40 PM  Result Value Ref Range   Color, Urine YELLOW YELLOW   APPearance CLEAR CLEAR   Specific Gravity, Urine 1.012 1.005 - 1.030   pH 6.0 5.0 - 8.0   Glucose, UA NEGATIVE NEGATIVE mg/dL   Hgb urine dipstick NEGATIVE NEGATIVE   Bilirubin Urine NEGATIVE NEGATIVE   Ketones, ur NEGATIVE NEGATIVE mg/dL   Protein, ur NEGATIVE NEGATIVE mg/dL   Nitrite NEGATIVE NEGATIVE   Leukocytes,Ua NEGATIVE NEGATIVE    Comment: Performed at [facility], [address], [place], [place] [address]  Rapid urine drug screen (hospital performed)     Status: None   Collection Time: 07/06/21  4:40 PM  Result Value Ref Range   Opiates NONE DETECTED NONE DETECTED   Cocaine NONE DETECTED NONE DETECTED   Benzodiazepines NONE DETECTED NONE DETECTED   Amphetamines NONE DETECTED NONE DETECTED   Tetrahydrocannabinol NONE DETECTED NONE DETECTED   Barbiturates  NONE DETECTED NONE DETECTED    Comment: (NOTE) DRUG SCREEN FOR MEDICAL PURPOSES ONLY.  IF CONFIRMATION IS NEEDED FOR ANY PURPOSE, NOTIFY LAB WITHIN 5 DAYS.  LOWEST DETECTABLE LIMITS FOR URINE DRUG SCREEN Drug Class                     Cutoff (  ng/mL) Amphetamine and metabolites    1000 Barbiturate and metabolites    200 Benzodiazepine                 200 Tricyclics and metabolites     300 Opiates and metabolites        300 Cocaine and metabolites        300 THC                            50 Performed at [facility], [address], [place], [place] [address]   Blood gas, venous (at [facility] and [facility], not at [facility])     Status: Abnormal   Collection Time: 07/06/21  5:36 PM  Result Value Ref Range   pH, Ven 7.35 7.25 - 7.43   pCO2, Ven 59 44 - 60 mmHg   pO2, Ven 33 32 - 45 mmHg   Bicarbonate 32.6 (H) 20.0 - 28.0 mmol/L   Acid-Base Excess 5.3 (H) 0.0 - 2.0 mmol/L   O2 Saturation 44.5 %   Patient temperature 37.0     Comment: Performed at [facility], [address], [place], [place] [address]   DG Tibia/Fibula Right  Result Date: 07/06/2021 CLINICAL DATA:  Fall, leg pain EXAM: RIGHT TIBIA AND FIBULA - 2 VIEW COMPARISON:  None. FINDINGS: No acute fracture or dislocation identified. No significant soft tissue abnormality identified. IMPRESSION: No acute fracture identified. Electronically Signed   By: Jannifer Hick M.D.   On: 07/06/2021 14:33   CT Head Wo Contrast  Result Date: 07/06/2021 CLINICAL DATA:  61 year old female with altered mental status. History of multiple sclerosis. EXAM: CT HEAD WITHOUT CONTRAST TECHNIQUE: Contiguous axial images were obtained from the base of the skull through the vertex without intravenous contrast. RADIATION DOSE REDUCTION: This exam was performed according to the departmental dose-optimization program which includes automated exposure control, adjustment of the mA and/or kV according to patient size and/or use of  iterative reconstruction technique. COMPARISON:  06/29/2021 head CT and prior studies FINDINGS: Brain: No evidence of acute infarction, hemorrhage, hydrocephalus, extra-axial collection or mass lesion/mass effect. No white matter abnormalities on prior MRs are difficult to visualized on this study. Vascular: No hyperdense vessel or unexpected calcification. Skull: Normal. Negative for fracture or focal lesion. Sinuses/Orbits: No acute finding. Other: None. IMPRESSION: No evidence of acute intracranial abnormality. Electronically Signed   By: Harmon Pier M.D.   On: 07/06/2021 14:51   DG Chest Portable 1 View  Result Date: 07/06/2021 CLINICAL DATA:  Weakness. EXAM: PORTABLE CHEST 1 VIEW COMPARISON:  12/10/2020 FINDINGS: 1401 hours. Low lung volumes. The cardio pericardial silhouette is enlarged. There is pulmonary vascular congestion without overt pulmonary edema. Diffuse interstitial opacity suggests edema. No substantial pleural effusion. The visualized bony structures of the thorax show no acute abnormality. IMPRESSION: Low volume film with vascular congestion and probable interstitial pulmonary edema. Electronically Signed   By: Kennith Center M.D.   On: 07/06/2021 14:37   DG Knee Complete 4 Views Right  Result Date: 07/06/2021 CLINICAL DATA:  Fall, knee pain EXAM: RIGHT KNEE - COMPLETE 4+ VIEW COMPARISON:  None. FINDINGS: No acute fracture or dislocation identified. Moderate degenerative changes at the patellofemoral joint and mild at the medial and lateral femorotibial compartments. Tricompartmental osteophytes. No joint effusion visualized. IMPRESSION: Chronic changes with no acute fracture identified. Electronically Signed   By: Jannifer Hick M.D.   On: 07/06/2021 14:22   DG  Foot Complete Right  Result Date: 07/06/2021 CLINICAL DATA:  Fall, foot pain EXAM: RIGHT FOOT COMPLETE - 3+ VIEW COMPARISON:  None. FINDINGS: No acute fracture or dislocation identified. Bones are osteopenic. Old fracture  deformity of the fifth metatarsal. Advanced degenerative changes of the first metatarsophalangeal joint with severe joint space narrowing, subchondral sclerosis and marginal osteophytes. Plantar calcaneal spur. IMPRESSION: No acute osseous abnormality identified. Multiple chronic findings as described. Electronically Signed   By: Jannifer Hick M.D.   On: 07/06/2021 14:21    Pending Labs Unresulted Labs (From admission, onward)     Start     Ordered   07/06/21 1935  Brain natriuretic peptide  Once,   R        07/06/21 1934   07/06/21 1917  CK  Add-on,   AD       Question:  Release to patient  Answer:  Immediate   07/06/21 1916   07/06/21 1917  Differential  Add-on,   AD       Question:  Release to patient  Answer:  Immediate   07/06/21 1916   07/06/21 1917  Hepatic function panel  Add-on,   AD       Question:  Release to patient  Answer:  Immediate   07/06/21 1916   07/06/21 1917  Magnesium  Add-on,   AD       Question:  Release to patient  Answer:  Immediate   07/06/21 1916   07/06/21 1917  Phosphorus  Add-on,   AD       Question:  Release to patient  Answer:  Immediate   07/06/21 1916   07/06/21 1917  TSH  Add-on,   AD       Question:  Release to patient  Answer:  Immediate   07/06/21 1916   07/06/21 1909  Hemoglobin A1c  Once,   R       Comments: To assess prior glycemic control    07/06/21 1908   07/06/21 1836  Resp Panel by RT-PCR (Flu A&B, Covid) Nasopharyngeal Swab  (Tier 2 - Symptomatic/asymptomatic)  Once,   STAT        07/06/21 1835   Signed and Held  Magnesium  Tomorrow morning,   R        Signed and Held   Signed and Held  Phosphorus  Tomorrow morning,   R        Signed and Held   Signed and Held  CBC WITH DIFFERENTIAL  Tomorrow morning,   R        Signed and Held   Signed and Held  TSH  Tomorrow morning,   R        Signed and Held   Signed and Held  Comprehensive metabolic panel  Tomorrow morning,   R        Signed and Held             Vitals/Pain Today's Vitals   07/06/21 1830 07/06/21 1900 07/06/21 1930 07/06/21 2000  BP: (!) 189/113 (!) 173/95 (!) 187/99 (!) 144/74  Pulse: 90 84 90 90  Resp: 19 10 14 16   Temp:      TempSrc:      SpO2: 96% 98% 100% 97%  PainSc:        Isolation Precautions No active isolations  Medications Medications  insulin aspart (novoLOG) injection 0-9 Units (has no administration in time range)  potassium chloride SA (KLOR-CON M) CR tablet 20 mEq (has no administration  in time range)  naloxone [facility]) injection 0.4 mg (has no administration in time range)    Mobility non-ambulatory Moderate fall risk   Focused Assessments    R Recommendations: See Admitting Provider Note  Report given to:   Additional Notes:

## 2021-07-06 NOTE — Assessment & Plan Note (Addendum)
Order sliding scale Given poor p.o. intake and relatively low blood sugars order Lantus at 10 units can increase as needed

## 2021-07-06 NOTE — ED Triage Notes (Signed)
EMS reports from home, called out for generalized weakness and falls over last couple days, fall today and c/o right knee and toe pain with small abrasion on great toe. Hx of MS.  BP 142/68 HR 86 RR 20 Sp02 100 RA CBG 155

## 2021-07-07 ENCOUNTER — Observation Stay (HOSPITAL_COMMUNITY): Payer: Medicare Other

## 2021-07-07 ENCOUNTER — Encounter (HOSPITAL_COMMUNITY)
Admission: EM | Disposition: A | Discharge status: Skilled Nursing Facility | Payer: Self-pay | Source: Home / Self Care | Attending: Internal Medicine

## 2021-07-07 ENCOUNTER — Observation Stay (HOSPITAL_COMMUNITY): Payer: Medicare Other | Admitting: Anesthesiology

## 2021-07-07 ENCOUNTER — Observation Stay (HOSPITAL_BASED_OUTPATIENT_CLINIC_OR_DEPARTMENT_OTHER): Payer: Medicare Other

## 2021-07-07 ENCOUNTER — Observation Stay (HOSPITAL_BASED_OUTPATIENT_CLINIC_OR_DEPARTMENT_OTHER): Payer: Medicare Other | Admitting: Anesthesiology

## 2021-07-07 ENCOUNTER — Encounter (HOSPITAL_COMMUNITY): Payer: Self-pay | Admitting: Internal Medicine

## 2021-07-07 DIAGNOSIS — M6281 Muscle weakness (generalized): Secondary | ICD-10-CM

## 2021-07-07 DIAGNOSIS — E119 Type 2 diabetes mellitus without complications: Secondary | ICD-10-CM

## 2021-07-07 DIAGNOSIS — I509 Heart failure, unspecified: Secondary | ICD-10-CM

## 2021-07-07 DIAGNOSIS — F418 Other specified anxiety disorders: Secondary | ICD-10-CM

## 2021-07-07 DIAGNOSIS — I10 Essential (primary) hypertension: Secondary | ICD-10-CM

## 2021-07-07 DIAGNOSIS — E1142 Type 2 diabetes mellitus with diabetic polyneuropathy: Secondary | ICD-10-CM | POA: Diagnosis not present

## 2021-07-07 DIAGNOSIS — G35 Multiple sclerosis: Secondary | ICD-10-CM | POA: Diagnosis not present

## 2021-07-07 HISTORY — PX: RADIOLOGY WITH ANESTHESIA: SHX6223

## 2021-07-07 LAB — CBC WITH DIFFERENTIAL/PLATELET
Abs Immature Granulocytes: 0.02 10*3/uL (ref 0.00–0.07)
Basophils Absolute: 0 10*3/uL (ref 0.0–0.1)
Basophils Relative: 0 %
Eosinophils Absolute: 0.1 10*3/uL (ref 0.0–0.5)
Eosinophils Relative: 1 %
HCT: 37.3 % (ref 36.0–46.0)
Hemoglobin: 12.8 g/dL (ref 12.0–15.0)
Immature Granulocytes: 0 %
Lymphocytes Relative: 40 %
Lymphs Abs: 3.3 10*3/uL (ref 0.7–4.0)
MCH: 30.8 pg (ref 26.0–34.0)
MCHC: 34.3 g/dL (ref 30.0–36.0)
MCV: 89.9 fL (ref 80.0–100.0)
Monocytes Absolute: 0.6 10*3/uL (ref 0.1–1.0)
Monocytes Relative: 8 %
Neutro Abs: 4.2 10*3/uL (ref 1.7–7.7)
Neutrophils Relative %: 51 %
Platelets: 253 10*3/uL (ref 150–400)
RBC: 4.15 MIL/uL (ref 3.87–5.11)
RDW: 12.4 % (ref 11.5–15.5)
WBC: 8.2 10*3/uL (ref 4.0–10.5)
nRBC: 0 % (ref 0.0–0.2)

## 2021-07-07 LAB — COMPREHENSIVE METABOLIC PANEL
ALT: 23 U/L (ref 0–44)
AST: 29 U/L (ref 15–41)
Albumin: 3.6 g/dL (ref 3.5–5.0)
Alkaline Phosphatase: 64 U/L (ref 38–126)
Anion gap: 9 (ref 5–15)
BUN: 11 mg/dL (ref 8–23)
CO2: 27 mmol/L (ref 22–32)
Calcium: 9.4 mg/dL (ref 8.9–10.3)
Chloride: 104 mmol/L (ref 98–111)
Creatinine, Ser: 0.79 mg/dL (ref 0.44–1.00)
GFR, Estimated: >60 mL/min (ref >60)
Glucose, Bld: 104 mg/dL — ABNORMAL HIGH (ref 70–99)
Potassium: 3 mmol/L — ABNORMAL LOW (ref 3.5–5.1)
Sodium: 140 mmol/L (ref 135–145)
Total Bilirubin: 0.2 mg/dL — ABNORMAL LOW (ref 0.3–1.2)
Total Protein: 6.5 g/dL (ref 6.5–8.1)

## 2021-07-07 LAB — GLUCOSE, CAPILLARY
Glucose-Capillary: 87 mg/dL (ref 70–99)
Glucose-Capillary: 109 mg/dL — ABNORMAL HIGH (ref 70–99)
Glucose-Capillary: 125 mg/dL — ABNORMAL HIGH (ref 70–99)
Glucose-Capillary: 106 mg/dL — ABNORMAL HIGH (ref 70–99)
Glucose-Capillary: 110 mg/dL — ABNORMAL HIGH (ref 70–99)
Glucose-Capillary: 117 mg/dL — ABNORMAL HIGH (ref 70–99)
Glucose-Capillary: 126 mg/dL — ABNORMAL HIGH (ref 70–99)
Glucose-Capillary: 142 mg/dL — ABNORMAL HIGH (ref 70–99)

## 2021-07-07 LAB — ECHOCARDIOGRAM COMPLETE
Area-P 1/2: 4.36 cm2
S' Lateral: 2.4 cm

## 2021-07-07 LAB — TSH: TSH: 1.123 u[IU]/mL (ref 0.350–4.500)

## 2021-07-07 LAB — MAGNESIUM: Magnesium: 2.2 mg/dL (ref 1.7–2.4)

## 2021-07-07 LAB — PHOSPHORUS: Phosphorus: 2.8 mg/dL (ref 2.5–4.6)

## 2021-07-07 SURGERY — MRI WITH ANESTHESIA
Anesthesia: General

## 2021-07-07 MED ORDER — FENTANYL CITRATE (PF) 100 MCG/2ML IJ SOLN
INTRAMUSCULAR | Status: AC
  Filled 2021-07-07: qty 2

## 2021-07-07 MED ORDER — MAGNESIUM OXIDE -MG SUPPLEMENT 400 (240 MG) MG PO TABS
400.0000 mg | ORAL_TABLET | Freq: Every day | ORAL | Status: DC
  Administered 2021-07-07 – 2021-07-16 (×10): 400 mg via ORAL
  Filled 2021-07-07 (×10): qty 1

## 2021-07-07 MED ORDER — DALFAMPRIDINE ER 10 MG PO TB12: 10.0000 mg | ORAL_TABLET | Freq: Two times a day (BID) | ORAL | Status: DC

## 2021-07-07 MED ORDER — ROCURONIUM BROMIDE 10 MG/ML (PF) SYRINGE
PREFILLED_SYRINGE | INTRAVENOUS | Status: DC | PRN
  Administered 2021-07-07: 80 mg via INTRAVENOUS

## 2021-07-07 MED ORDER — GADOBUTROL 1 MMOL/ML IV SOLN
10.0000 mL | Freq: Once | INTRAVENOUS | Status: AC | PRN
  Administered 2021-07-07: 10 mL via INTRAVENOUS

## 2021-07-07 MED ORDER — FENTANYL CITRATE (PF) 100 MCG/2ML IJ SOLN
INTRAMUSCULAR | Status: DC | PRN
Start: 2021-07-07 — End: 2021-07-07
  Administered 2021-07-07: 100 ug via INTRAVENOUS

## 2021-07-07 MED ORDER — MIDAZOLAM HCL 2 MG/2ML IJ SOLN
INTRAMUSCULAR | Status: DC | PRN
  Administered 2021-07-07: 2 mg via INTRAVENOUS

## 2021-07-07 MED ORDER — DEXAMETHASONE SODIUM PHOSPHATE 10 MG/ML IJ SOLN
INTRAMUSCULAR | Status: DC | PRN
Start: 2021-07-07 — End: 2021-07-07
  Administered 2021-07-07: 5 mg via INTRAVENOUS

## 2021-07-07 MED ORDER — PROPOFOL 10 MG/ML IV BOLUS
INTRAVENOUS | Status: DC | PRN
  Administered 2021-07-07: 200 mg via INTRAVENOUS

## 2021-07-07 MED ORDER — PHENYLEPHRINE 40 MCG/ML (10ML) SYRINGE FOR IV PUSH (FOR BLOOD PRESSURE SUPPORT)
PREFILLED_SYRINGE | INTRAVENOUS | Status: DC | PRN
  Administered 2021-07-07: 80 ug via INTRAVENOUS
  Administered 2021-07-07: 120 ug via INTRAVENOUS

## 2021-07-07 MED ORDER — MIDAZOLAM HCL 2 MG/2ML IJ SOLN
INTRAMUSCULAR | Status: AC
  Filled 2021-07-07: qty 2

## 2021-07-07 MED ORDER — CHLORHEXIDINE GLUCONATE CLOTH 2 % EX PADS
6.0000 | MEDICATED_PAD | Freq: Every day | CUTANEOUS | Status: AC
  Administered 2021-07-07 – 2021-07-11 (×5): 6 via TOPICAL

## 2021-07-07 MED ORDER — ONDANSETRON HCL 4 MG/2ML IJ SOLN
INTRAMUSCULAR | Status: DC | PRN
  Administered 2021-07-07: 4 mg via INTRAVENOUS

## 2021-07-07 MED ORDER — LAMOTRIGINE 100 MG PO TABS
200.0000 mg | ORAL_TABLET | Freq: Two times a day (BID) | ORAL | Status: DC
  Administered 2021-07-07 – 2021-07-16 (×18): 200 mg via ORAL
  Filled 2021-07-07 (×18): qty 2

## 2021-07-07 MED ORDER — MUPIROCIN 2 % EX OINT
1.0000 "application " | TOPICAL_OINTMENT | Freq: Two times a day (BID) | CUTANEOUS | Status: AC
  Administered 2021-07-07 – 2021-07-11 (×10): 1 via NASAL
  Filled 2021-07-07: qty 22

## 2021-07-07 MED ORDER — PANTOPRAZOLE SODIUM 40 MG PO TBEC
40.0000 mg | DELAYED_RELEASE_TABLET | Freq: Two times a day (BID) | ORAL | Status: DC
Start: 2021-07-07 — End: 2021-07-16
  Administered 2021-07-07 – 2021-07-16 (×19): 40 mg via ORAL
  Filled 2021-07-07 (×19): qty 1

## 2021-07-07 MED ORDER — AMANTADINE HCL 100 MG PO CAPS
100.0000 mg | ORAL_CAPSULE | Freq: Two times a day (BID) | ORAL | Status: DC
  Administered 2021-07-07 – 2021-07-16 (×18): 100 mg via ORAL
  Filled 2021-07-07 (×19): qty 1

## 2021-07-07 MED ORDER — SUGAMMADEX SODIUM 200 MG/2ML IV SOLN
INTRAVENOUS | Status: DC | PRN
  Administered 2021-07-07: 400 mg via INTRAVENOUS

## 2021-07-07 MED ORDER — SUCCINYLCHOLINE CHLORIDE 200 MG/10ML IV SOSY
PREFILLED_SYRINGE | INTRAVENOUS | Status: DC | PRN
  Administered 2021-07-07: 140 mg via INTRAVENOUS

## 2021-07-07 MED ORDER — LACTATED RINGERS IV SOLN: INTRAVENOUS | Status: DC | PRN

## 2021-07-07 MED ORDER — BACITRACIN ZINC 500 UNIT/GM EX OINT
TOPICAL_OINTMENT | Freq: Two times a day (BID) | CUTANEOUS | Status: DC
  Filled 2021-07-07: qty 28.4

## 2021-07-07 MED ORDER — AMITRIPTYLINE HCL 25 MG PO TABS
25.0000 mg | ORAL_TABLET | Freq: Every day | ORAL | Status: DC
  Administered 2021-07-07 – 2021-07-15 (×9): 25 mg via ORAL
  Filled 2021-07-07 (×9): qty 1

## 2021-07-07 MED ORDER — OXYCODONE HCL 5 MG PO TABS
10.0000 mg | ORAL_TABLET | Freq: Four times a day (QID) | ORAL | Status: DC | PRN
  Administered 2021-07-07 – 2021-07-09 (×6): 15 mg via ORAL
  Administered 2021-07-09: 10 mg via ORAL
  Administered 2021-07-10 – 2021-07-11 (×4): 15 mg via ORAL
  Administered 2021-07-11: 10 mg via ORAL
  Administered 2021-07-12 – 2021-07-16 (×10): 15 mg via ORAL
  Filled 2021-07-07 (×10): qty 3
  Filled 2021-07-07: qty 2
  Filled 2021-07-07 (×3): qty 3
  Filled 2021-07-07: qty 2
  Filled 2021-07-07 (×7): qty 3
  Filled 2021-07-07: qty 2

## 2021-07-07 MED ORDER — LIDOCAINE 2% (20 MG/ML) 5 ML SYRINGE
INTRAMUSCULAR | Status: DC | PRN
  Administered 2021-07-07: 100 mg via INTRAVENOUS

## 2021-07-07 MED ORDER — BUSPIRONE HCL 10 MG PO TABS
15.0000 mg | ORAL_TABLET | Freq: Two times a day (BID) | ORAL | Status: DC
Start: 2021-07-07 — End: 2021-07-07
  Filled 2021-07-07 (×2): qty 2

## 2021-07-07 NOTE — Transfer of Care (Signed)
Immediate Anesthesia Transfer of Care Note  Patient: [name]  Procedure(s) Performed: MRI WITH ANESTHESIA  Patient Location: PACU  Anesthesia Type:General  Level of Consciousness: drowsy, patient cooperative and responds to stimulation  Airway & Oxygen Therapy: Patient Spontanous Breathing  Post-op Assessment: Report given to RN and Post -op Vital signs reviewed and stable  Post vital signs: Reviewed and stable  Last Vitals:  Vitals Value Taken Time  BP 130/65 07/07/21 1711  Temp    Pulse 98 07/07/21 1712  Resp 19 07/07/21 1712  SpO2 100 % 07/07/21 1712  Vitals shown include unvalidated device data.  Last Pain:  Vitals:   07/07/21 1142  TempSrc: Oral  PainSc:          Complications: No notable events documented.

## 2021-07-07 NOTE — Anesthesia Postprocedure Evaluation (Signed)
Anesthesia Post Note  Patient: [name]  Procedure(s) Performed: MRI WITH ANESTHESIA     Patient location during evaluation: PACU Anesthesia Type: General Level of consciousness: awake and alert Pain management: pain level controlled Vital Signs Assessment: post-procedure vital signs reviewed and stable Respiratory status: spontaneous breathing, nonlabored ventilation and respiratory function stable Cardiovascular status: stable and blood pressure returned to baseline Anesthetic complications: no   No notable events documented.  Last Vitals:  Vitals:   07/07/21 1710 07/07/21 1725  BP: 130/65 131/77  Pulse: 98 96  Resp: 20 19  Temp: 36.8 C   SpO2: 100% 100%    Last Pain:  Vitals:   07/07/21 1710  TempSrc:   PainSc: 0-No pain                 Audry Pili

## 2021-07-07 NOTE — Evaluation (Signed)
Physical Therapy Evaluation Patient Details Name: [name] MRN: [id number] DOB: [birth year] Today's Date: 07/07/2021  History of Present Illness  [name] is a 61 yo female who presented with generalized weakness, bilateral leg numbness tingling and falls over last couple days. Pending MRI work up to confirm MS flare. Medical history significant of  MS, HTN , HLD   Clinical Impression  Pt admitted with above. Pt reporting of bilat foot pain, neck pain, and headache all at 10/10 however was able to stand up at bedside and take side steps to [facility]. Pt to benefit from AIR upon d/c to maximize functional return to achieve safe mod I level. Acute PT to cont to follow.       Recommendations for follow up therapy are one component of a multi-disciplinary discharge planning process, led by the attending physician.  Recommendations may be updated based on patient status, additional functional criteria and insurance authorization.  Follow Up Recommendations Acute inpatient rehab (3hours/day)    Assistance Recommended at Discharge Frequent or constant Supervision/Assistance  Patient can return home with the following  A lot of help with walking and/or transfers;A little help with bathing/dressing/bathroom;Assistance with cooking/housework;Assist for transportation;Help with stairs or ramp for entrance    Equipment Recommendations  (has rollator, SPC, and electric scooter)  Recommendations for Other Services  Rehab consult    Functional Status Assessment Patient has had a recent decline in their functional status and demonstrates the ability to make significant improvements in function in a reasonable and predictable amount of time.     Precautions / Restrictions Precautions Precautions: Fall Restrictions Weight Bearing Restrictions: No      Mobility  Bed Mobility Overal bed mobility: Needs Assistance Bed Mobility: Rolling, Sidelying to Sit Rolling: Mod assist Sidelying to sit: Mod  assist       General bed mobility comments: verbal cues for technique, HOB elevated, modA due to body habitus, pt with good push up from L UE into upright, increased time    Transfers Overall transfer level: Needs assistance Equipment used: Rolling walker (2 wheels) Transfers: Sit to/from Stand Sit to Stand: Min assist           General transfer comment: after max encouragement pt agreed to stand, pt with c/o 10/10 bilat foot pain, pt with good power up    Ambulation/Gait               General Gait Details: pt deferred however was able to complete 6 side steps to HOB, c/o 10/10 bilat foot pain, very dependent on RW, minA and max verbal cues to sequence  Stairs            Wheelchair Mobility    Modified Rankin (Stroke Patients Only)       Balance Overall balance assessment: Needs assistance Sitting-balance support: Bilateral upper extremity supported, Feet unsupported (pt did not want to put feet on the floor due to onset of pain with weightbearing) Sitting balance-Leahy Scale: Fair Sitting balance - Comments: pt posting on L UE   Standing balance support: Bilateral upper extremity supported Standing balance-Leahy Scale: Poor Standing balance comment: dependent on RW                             Pertinent Vitals/Pain Pain Assessment Pain Assessment: 0-10 Pain Score: 10-Worst pain ever Pain Location: bilat feet when standing, headache and neck at rest Pain Descriptors / Indicators: Headache, Sharp Pain Intervention(s): Patient requesting  pain meds-RN notified    Home Living Family/patient expects to be discharged to:: Private residence Living Arrangements: Children Available Help at Discharge: Family;Available 24 hours/day (however daughter to start new job in 2 weeks that will take her outside the home) Type of Home: House Home Access: Stairs to enter Entrance Stairs-Rails: None Entrance Stairs-Number of Steps: 2 at the gait, 1 at the  house   Home Layout: One [place]: Rollator (4 wheels);Shower seat;Electric scooter;Cane - single point Additional Comments: lives with dtr but she will then start to work outside the home in 2 weeks    Prior Function Prior Level of Function : Needs assist             Mobility Comments: walks with SPC. states she has a Conserv[employer], museum/gallery at home, she does not use them, states "they gave me a rollator but I have to put it together and I can't ADLs Comments: mod I with shower chair     Hand Dominance   Dominant Hand: Right    Extremity/Trunk Assessment   Upper Extremity Assessment Upper Extremity Assessment: Defer to OT evaluation RUE Deficits / Details: Full ROM, globally 4/5, paresthesia with pins & needles tingling RUE Sensation: decreased light touch RUE Coordination: decreased fine motor LUE Deficits / Details: Full ROM, globally 4-/5, paresthesia with pins & needles tingling LUE Sensation: decreased light touch LUE Coordination: decreased fine motor    Lower Extremity Assessment Lower Extremity Assessment: Generalized weakness    Cervical / Trunk Assessment Cervical / Trunk Assessment: Normal;Other exceptions Cervical / Trunk Exceptions: face numbness (headache)  Communication   Communication: No difficulties  Cognition Arousal/Alertness: Awake/alert Behavior During Therapy: WFL for tasks assessed/performed Overall Cognitive Status: Within Functional Limits for tasks assessed                                 General Comments: pt emotional regarding being NPO        General Comments General comments (skin integrity, edema, etc.): VSS on RA    Exercises     Assessment/Plan    PT Assessment Patient needs continued PT services  PT Problem List Decreased activity tolerance;Decreased range of motion;Decreased strength;Decreased balance;Decreased mobility       PT Treatment Interventions DME instruction;Gait  training;Stair training;Functional mobility training;Therapeutic activities;Therapeutic exercise;Balance training;Neuromuscular re-education    PT Goals (Current goals can be found in the Care Plan section)  Acute Rehab PT Goals Patient Stated Goal: stop the pain PT Goal Formulation: With patient Time For Goal Achievement: 07/21/21 Potential to Achieve Goals: Good    Frequency Min 3X/week     Co-evaluation               AM-PAC PT "6 Clicks" Mobility  Outcome Measure Help needed turning from your back to your side while in a flat bed without using bedrails?: A Lot Help needed moving from lying on your back to sitting on the side of a flat bed without using bedrails?: A Lot Help needed moving to and from a bed to a chair (including a wheelchair)?: A Little Help needed standing up from a chair using your arms (e.g., wheelchair or bedside chair)?: A Little Help needed to walk in hospital room?: A Lot Help needed climbing 3-5 steps with a railing? : A Lot 6 Click Score: 14    End of Session Equipment Utilized During Treatment: Gait belt Activity Tolerance: Patient limited by  pain Patient left: in bed;with call bell/phone within reach;with bed alarm set;with family/visitor present Nurse Communication: Mobility status;Patient requests pain meds PT Visit Diagnosis: Unsteadiness on feet (R26.81);Muscle weakness (generalized) (M62.81);Difficulty in walking, not elsewhere classified (R26.2)    Time: [id number] PT Time Calculation (min) (ACUTE ONLY): 19 min   Charges:   PT Evaluation $PT Eval Moderate Complexity: 1 Mod          [name], PT, DPT Acute Rehabilitation Services Pager #: [phone] Office #: [phone]   [name] 07/07/2021, 12:32 PM

## 2021-07-07 NOTE — Anesthesia Procedure Notes (Signed)
Procedure Name: Intubation Date/Time: 07/07/2021 3:27 PM Performed by: Janace Litten, CRNA Pre-anesthesia Checklist: Patient identified, Emergency Drugs available, Suction available and Patient being monitored Patient Re-evaluated:Patient Re-evaluated prior to induction Oxygen Delivery Method: Circle System Utilized Preoxygenation: Pre-oxygenation with 100% oxygen Induction Type: IV induction and Rapid sequence Ventilation: Mask ventilation without difficulty Laryngoscope Size: Mac and 3 Grade View: Grade II Tube type: Oral Tube size: 7.0 mm Number of attempts: 1 Airway Equipment and Method: Stylet and Oral airway Placement Confirmation: ETT inserted through vocal cords under direct vision, positive ETCO2 and breath sounds checked- equal and bilateral Secured at: 22 cm Tube secured with: Tape Dental Injury: Teeth and Oropharynx as per pre-operative assessment

## 2021-07-07 NOTE — Progress Notes (Signed)
Pt admitted to [facility] via [facility] from [facility]. Patient A&O X 3. Patient welcomed, oriented to unit and given a meal. Patient called her daughter to let her know her room number.

## 2021-07-07 NOTE — Anesthesia Preprocedure Evaluation (Addendum)
Anesthesia Evaluation  Patient identified by MRN, date of birth, ID band Patient awake    Reviewed: Allergy & Precautions, NPO status , Patient's Chart, lab work & pertinent test results  History of Anesthesia Complications Negative for: history of anesthetic complications  Airway Mallampati: III  TM Distance: >3 FB Neck ROM: Full    Dental  (+) Poor Dentition, Dental Advisory Given   Pulmonary Current Smoker,    Pulmonary exam normal        Cardiovascular hypertension, Pt. on medications Normal cardiovascular exam     Neuro/Psych PSYCHIATRIC DISORDERS Anxiety Depression MS: foot and leg pain, Blurred vision, Hands painful and shaking.  Neuromuscular disease    GI/Hepatic GERD  ,  Endo/Other  diabetesMorbid obesity  Renal/GU      Musculoskeletal   Abdominal   Peds  Hematology   Anesthesia Other Findings   Reproductive/Obstetrics                            Anesthesia Physical Anesthesia Plan  ASA: 3  Anesthesia Plan: General   Post-op Pain Management: Minimal or no pain anticipated   Induction: Intravenous  PONV Risk Score and Plan: 2 and Ondansetron and Dexamethasone  Airway Management Planned: Oral ETT  Additional Equipment:   Intra-op Plan:   Post-operative Plan: Extubation in OR  Informed Consent: I have reviewed the patients History and Physical, chart, labs and discussed the procedure including the risks, benefits and alternatives for the proposed anesthesia with the patient or authorized representative who has indicated his/her understanding and acceptance.     Dental advisory given  Plan Discussed with: Anesthesiologist and CRNA  Anesthesia Plan Comments:        Anesthesia Quick Evaluation

## 2021-07-07 NOTE — Evaluation (Signed)
Occupational Therapy Evaluation Patient Details Name: [name] MRN: [id number] DOB: May 01, 1961 Today's Date: 07/07/2021   History of Present Illness [name] is a 61 yo female who presented with generalized weakness, bilateral leg numbness tingling and falls over last couple days. Pending MRI work up to confirm MS flare. Medical history significant of  MS, HTN , HLD   Clinical Impression   [name] was evaluated s/p the above admission list, she is generally mod I at baseline with use if SPC, no driving. She lives in a 1 level home with her daughter who can assist 24/7 at d/c. Upon evaluation pt was extremely limited by pins & needles pain and weakness in her BLE, distal BUEs and her face. Session limited to bed level with mod A for rolling and min-max A for ADLs. Pt awaiting MRI to confirm MS flare up to prior to beginning medications. Discussed rehab options with pt and progressing function acutely, recommending CIR at this time based on current increased assist levels. Ot to follow acutely.      Recommendations for follow up therapy are one component of a multi-disciplinary discharge planning process, led by the attending physician.  Recommendations may be updated based on patient status, additional functional criteria and insurance authorization.   Follow Up Recommendations  Acute inpatient rehab (3hours/day)       Patient can return home with the following A lot of help with walking and/or transfers;A lot of help with bathing/dressing/bathroom;Assistance with cooking/housework;Assist for transportation;Help with stairs or ramp for entrance    Functional Status Assessment  Patient has had a recent decline in their functional status and demonstrates the ability to make significant improvements in function in a reasonable and predictable amount of time.  Equipment Recommendations  Other (comment) (defer)    Recommendations for Other Services Rehab consult     Precautions /  Restrictions Precautions Precautions: Fall Restrictions Weight Bearing Restrictions: No      Mobility Bed Mobility Overal bed mobility: Needs Assistance Bed Mobility: Rolling Rolling: Mod assist         General bed mobility comments: for cues and more physical assist for L than R    Transfers Overall transfer level: Needs assistance                 General transfer comment: pt declined attempt      Balance Overall balance assessment: Needs assistance           ADL either performed or assessed with clinical judgement   ADL Overall ADL's : Needs assistance/impaired Eating/Feeding: NPO   Grooming: Set up;Sitting Grooming Details (indicate cue type and reason): incr time Upper Body Bathing: Moderate assistance;Bed level   Lower Body Bathing: Maximal assistance;Bed level   Upper Body Dressing : Minimal assistance;Bed level   Lower Body Dressing: Maximal assistance;Bed level   Toilet Transfer: Total assistance Toilet Transfer Details (indicate cue type and reason): at bed level - pt declining to get OOB this session Toileting- Clothing Manipulation and Hygiene: Maximal assistance;Bed level       Functional mobility during ADLs: Maximal assistance General ADL Comments: limited by pain and numbess in all extremities, declining attempt to get OOB so assessment limited to bed level     Vision Baseline Vision/History: 0 No visual deficits Ability to See in Adequate Light: 1 Impaired Patient Visual Report: Blurring of vision Vision Assessment?: Vision impaired- to be further tested in functional context Additional Comments: pt reports blurred vision in L eye - futher  assessment needed          Hand Dominance Right   Extremity/Trunk Assessment Upper Extremity Assessment Upper Extremity Assessment: Generalized weakness;RUE deficits/detail;LUE deficits/detail RUE Deficits / Details: Full ROM, globally 4/5, paresthesia with pins & needles tingling RUE  Sensation: decreased light touch RUE Coordination: decreased fine motor LUE Deficits / Details: Full ROM, globally 4-/5, paresthesia with pins & needles tingling LUE Sensation: decreased light touch LUE Coordination: decreased fine motor   Lower Extremity Assessment Lower Extremity Assessment: Defer to PT evaluation   Cervical / Trunk Assessment Cervical / Trunk Assessment: Normal;Other exceptions Cervical / Trunk Exceptions: face numbness   Communication Communication Communication: No difficulties   Cognition Arousal/Alertness: Awake/alert Behavior During Therapy: WFL for tasks assessed/performed Overall Cognitive Status: Within Functional Limits for tasks assessed         General Comments  VSS on RA     Home Living Family/patient expects to be discharged to:: Private residence Living Arrangements: Children Available Help at Discharge: Family;Available 24 hours/day Type of Home: House Home Access: Stairs to enter [employer] of Steps: 2 at the gait, 1 at the house   Home Layout: One level     Bathroom Shower/Tub: Chief Strategy Officer: Standard     Home Equipment: Rollator (4 wheels);Shower seat;Electric scooter;Cane - single point   Additional Comments: lives with daughter who is out of work for 2 weeks      Prior Functioning/Environment               Mobility Comments: walks with SPC. states she has a Therapist, sports at home, she does not use them ADLs Comments: mod I with shower chair        OT Problem List: Decreased strength;Decreased range of motion;Decreased activity tolerance;Impaired balance (sitting and/or standing);Pain;Decreased safety awareness;Decreased knowledge of use of DME or AE;Decreased knowledge of precautions;Impaired sensation      OT Treatment/Interventions: Self-care/ADL training;Therapeutic exercise;DME and/or AE instruction;Therapeutic activities;Patient/family education;Balance training     OT Goals(Current goals can be found in the care plan section) Acute Rehab OT Goals Patient Stated Goal: walk again OT Goal Formulation: With patient Time For Goal Achievement: 07/21/21 Potential to Achieve Goals: Good ADL Goals Pt Will Perform Grooming: standing Pt Will Perform Lower Body Dressing: with modified independence;sit to/from stand Pt Will Transfer to Toilet: with modified independence Additional ADL Goal #1: Pt will indep recall at least 3 fall prevention strategies to apply to the home setting  OT Frequency: Min 2X/week       AM-PAC OT "6 Clicks" Daily Activity     Outcome Measure Help from another person eating meals?: Total Help from another person taking care of personal grooming?: A Little Help from another person toileting, which includes using toliet, bedpan, or urinal?: A Lot Help from another person bathing (including washing, rinsing, drying)?: A Lot Help from another person to put on and taking off regular upper body clothing?: A Little Help from another person to put on and taking off regular lower body clothing?: A Lot 6 Click Score: 13   End of Session Nurse Communication: Mobility status;Precautions  Activity Tolerance: Patient tolerated treatment well;Patient limited by pain Patient left: in bed;with call bell/phone within reach;with bed alarm set  OT Visit Diagnosis: Other abnormalities of gait and mobility (R26.89);Unsteadiness on feet (R26.81);Repeated falls (R29.6);Muscle weakness (generalized) (M62.81);History of falling (Z91.81);Pain                Time: 4825-0037 OT Time Calculation (min):  20 min Charges:  OT General Charges $OT Visit: 1 Visit OT Evaluation $OT Eval Moderate Complexity: 1 Mod   Vennesa Bastedo A Diannah Rindfleisch 07/07/2021, 9:54 AM

## 2021-07-07 NOTE — Progress Notes (Signed)
[name]  EOF:[id number] DOB: May 31, 1960 DOA: 07/06/2021 PCP: Felix Pacini, FNP    Brief Narrative:  [phone] with a history of multiple sclerosis, HTN, DM2, obesity, and HLD who presented to the ED after a fall and further reporting numbness of the legs bilaterally for 4 days with associated progressive weakness and numbness.  CT head in the ED revealed no acute findings.  Neurology was contacted and recommended an MRI, for which the patient has required sedation previously.  Consultants:  Neurology  Code Status: FULL CODE  Antimicrobials:  None  DVT prophylaxis: SCDs  Interim Hx: Afebrile.  Vital signs stable.  Saturation 100% room air.  CBG controlled.  Complains of ongoing pain in lower extremities with weakness and paresthesias/tingling.  Denies chest pain or shortness of breath.  Is alert and oriented.  Assessment & Plan:  Suspected flare of MS Sedated MRI of pending to evaluate for suspected flare -if evidence noted neurology will be consulted for treatment recommendations  DM 2 with peripheral neuropathy A1c 5.5 -CBG well controlled at this time  Hypokalemia Likely simply due to poor intake -supplement and follow -magnesium normal  Obesity - There is no height or weight on file to calculate BMI.  Essential HTN Blood pressure reasonably controlled -follow trend  Possible pulmonary edema Mentioned on admission CXR -TTE this admit notes EF 60-65% with no WMA and normal diastolic parameters with no appreciable valve abnormalities  Chronic pain Resume usual pain regimen and monitor for sedation  Family Communication: Spoke with patient and family member at bedside Disposition: From home -not yet medically stable but anticipate return home  Objective: Blood pressure (!) 143/77, pulse 83, temperature (!) 97.5 F (36.4 C), temperature source Oral, resp. rate 17, SpO2 100 %.  Intake/Output Summary (Last 24 hours) at 07/07/2021 1003 Last data filed at  07/07/2021 0500 Gross per 24 hour  Intake --  Output 800 ml  Net -800 ml   There were no vitals filed for this visit.  Examination: General: No acute respiratory distress Lungs: Clear to auscultation bilaterally without wheezes or crackles Cardiovascular: Regular rate and rhythm without murmur gallop or rub normal S1 and S2 Abdomen: Nontender, nondistended, obese, soft, bowel sounds positive, no rebound, no ascites, no appreciable mass Extremities: Trace bilateral lower extremity edema  CBC: Recent Labs  Lab 07/06/21 1343 07/06/21 2201 07/07/21 0252  WBC 7.3 7.8 8.2  NEUTROABS 5.3 4.5 4.2  HGB 12.6 13.5 12.8  HCT 37.5 40.0 37.3  MCV 90.6 91.5 89.9  PLT 236 251 253   Basic Metabolic Panel: Recent Labs  Lab 07/06/21 1343 07/06/21 2201 07/07/21 0252  NA 140  --  140  K 3.4*  --  3.0*  CL 107  --  104  CO2 27  --  27  GLUCOSE 104*  --  104*  BUN 20  --  11  CREATININE 0.97  --  0.79  CALCIUM 9.3  --  9.4  MG  --  2.2 2.2  PHOS  --  3.5 2.8   GFR: CrCl cannot be calculated (Unknown ideal weight.).  Liver Function Tests: Recent Labs  Lab 07/06/21 1343 07/06/21 2201 07/07/21 0252  AST 29 32 29  ALT 24 25 23   ALKPHOS 57 64 64  BILITOT 0.2* 0.5 0.2*  PROT 6.9 7.2 6.5  ALBUMIN 4.0 4.0 3.6   Cardiac Enzymes: Recent Labs  Lab 07/06/21 2201  CKTOTAL 703*    HbA1C: Hgb A1c MFr Bld  Date/Time Value Ref  Range Status  07/06/2021 10:01 PM 5.5 4.8 - 5.6 % Final    Comment:    REPEATED TO VERIFY (NOTE) Pre diabetes:          5.7%-6.4%  Diabetes:              >6.4%  Glycemic control for   <7.0% adults with diabetes   12/10/2020 08:22 AM 10.6 (H) 4.8 - 5.6 % Final    Comment:    (NOTE)         Prediabetes: 5.7 - 6.4         Diabetes: >6.4         Glycemic control for adults with diabetes: <7.0     Scheduled Meds:  amLODipine  5 mg Oral Daily   atorvastatin  80 mg Oral QPM   DULoxetine  60 mg Oral BID   insulin aspart  0-9 Units Subcutaneous Q4H    insulin glargine  10 Units Subcutaneous Daily   losartan  100 mg Oral QHS   potassium chloride  20 mEq Oral Once   sodium chloride flush  3 mL Intravenous Q12H   Continuous Infusions:  sodium chloride       LOS: 0 days   Lonia Blood, MD [facility] Office  [phone] Pager - Text Page per Loretha Stapler  If 7PM-7AM, please contact night-coverage per Amion 07/07/2021, 10:03 AM

## 2021-07-08 ENCOUNTER — Encounter (HOSPITAL_COMMUNITY): Payer: Self-pay | Admitting: Radiology

## 2021-07-08 DIAGNOSIS — R531 Weakness: Secondary | ICD-10-CM | POA: Diagnosis not present

## 2021-07-08 DIAGNOSIS — G35 Multiple sclerosis: Secondary | ICD-10-CM | POA: Diagnosis not present

## 2021-07-08 LAB — GLUCOSE, CAPILLARY
Glucose-Capillary: 92 mg/dL (ref 70–99)
Glucose-Capillary: 95 mg/dL (ref 70–99)
Glucose-Capillary: 106 mg/dL — ABNORMAL HIGH (ref 70–99)
Glucose-Capillary: 104 mg/dL — ABNORMAL HIGH (ref 70–99)
Glucose-Capillary: 138 mg/dL — ABNORMAL HIGH (ref 70–99)
Glucose-Capillary: 139 mg/dL — ABNORMAL HIGH (ref 70–99)

## 2021-07-08 LAB — MRSA NEXT GEN BY PCR, NASAL: MRSA by PCR Next Gen: DETECTED — AB

## 2021-07-08 LAB — BASIC METABOLIC PANEL
Anion gap: 8 (ref 5–15)
BUN: 11 mg/dL (ref 8–23)
CO2: 27 mmol/L (ref 22–32)
Calcium: 9.4 mg/dL (ref 8.9–10.3)
Chloride: 105 mmol/L (ref 98–111)
Creatinine, Ser: 0.97 mg/dL (ref 0.44–1.00)
GFR, Estimated: >60 mL/min (ref >60)
Glucose, Bld: 94 mg/dL (ref 70–99)
Potassium: 4.1 mmol/L (ref 3.5–5.1)
Sodium: 140 mmol/L (ref 135–145)

## 2021-07-08 LAB — MAGNESIUM: Magnesium: 2.3 mg/dL (ref 1.7–2.4)

## 2021-07-08 LAB — CK: Total CK: 339 U/L — ABNORMAL HIGH (ref 38–234)

## 2021-07-08 LAB — FOLATE: Folate: 16.4 ng/mL (ref >5.9)

## 2021-07-08 LAB — VITAMIN B12: Vitamin B-12: 4296 pg/mL — ABNORMAL HIGH (ref 180–914)

## 2021-07-08 MED ORDER — SODIUM CHLORIDE 0.9 % IV SOLN
1000.0000 mg | Freq: Every day | INTRAVENOUS | Status: AC
  Administered 2021-07-08 – 2021-07-12 (×5): 1000 mg via INTRAVENOUS
  Filled 2021-07-08 (×7): qty 16

## 2021-07-08 MED ORDER — ELUXADOLINE 100 MG PO TABS
100.0000 mg | ORAL_TABLET | Freq: Two times a day (BID) | ORAL | Status: DC
  Administered 2021-07-08 – 2021-07-16 (×15): 100 mg via ORAL
  Filled 2021-07-08 (×3): qty 1

## 2021-07-08 MED ORDER — DIMETHYL FUMARATE 240 MG PO CPDR
240.0000 mg | DELAYED_RELEASE_CAPSULE | Freq: Two times a day (BID) | ORAL | Status: DC
  Administered 2021-07-08 – 2021-07-12 (×8): 240 mg via ORAL
  Filled 2021-07-08 (×2): qty 1

## 2021-07-08 MED ORDER — ZOLPIDEM TARTRATE 5 MG PO TABS
5.0000 mg | ORAL_TABLET | Freq: Every evening | ORAL | Status: DC | PRN
  Administered 2021-07-08 – 2021-07-16 (×11): 5 mg via ORAL
  Filled 2021-07-08 (×11): qty 1

## 2021-07-08 NOTE — Progress Notes (Signed)
Inpatient Rehab Admissions Coordinator:   Per therapy recommendations, patient was screened for CIR candidacy by Raelynne Ludwick, MS, CCC-SLP. At this time, Pt. does not appear to demonstrate medical necessity to justify in hospital rehabilitation/CIR. will not pursue a rehab consult for this Pt.   Recommend other rehab venues to be pursued.  Please contact me with any questions.  Cree Napoli, MS, CCC-SLP Rehab Admissions Coordinator  [phone] (celll) [phone] (office)   

## 2021-07-08 NOTE — Plan of Care (Signed)
  Problem: Education: Goal: Knowledge of General Education information will improve Description: Including pain rating scale, medication(s)/side effects and non-pharmacologic comfort measures Outcome: Progressing   Problem: Pain Managment: Goal: General experience of comfort will improve Outcome: Progressing   Problem: Safety: Goal: Ability to remain free from injury will improve Outcome: Progressing   

## 2021-07-08 NOTE — TOC Initial Note (Signed)
Transition of Care [facility]) - Initial/Assessment Note    Patient Details  Name: [name] MRN: [id number] Date of Birth: 06-Feb-1961  [facility] CM/SW Contact:    Pollie Friar, RN Phone Number: 07/08/2021, 12:46 PM  Clinical Narrative:        Patient lives at home with her daughter that can provide needed supervision. She uses [facility] for her medications and administers them herself at home. She denies any issues. She uses transportation through her insurance for medical needs.           Recommendations are for CIR but patient doesn't qualify. CM met with the patient and she prefers to attend SNF rehab prior to home. She asked to be faxed out in the [facility] area. [name] fax her out and provide bed offers as available.  TOC following.   Expected Discharge Plan: Skilled Nursing Facility Barriers to Discharge: Continued Medical Work up   Patient Goals and CMS Choice   CMS Medicare.gov Compare Post Acute Care list provided to:: Patient Choice offered to / list presented to : Patient  Expected Discharge Plan and Services Expected Discharge Plan: [place] In-house Referral: Clinical Social Work Discharge Planning Services: CM Consult Post Acute Care Choice: [place] arrangements for the past 2 months: [place]                                      Prior Living Arrangements/Services Living arrangements for the past 2 months: Single Family Home Lives with:: Adult Children Patient language and need for interpreter reviewed:: Yes Do you feel safe going back to the place where you live?: Yes      Need for Family Participation in Patient Care: Yes (Comment) Care giver support system in place?: Yes (comment) Current home services: DME (rollator/ scooter/ cane/ shower seat) Criminal Activity/Legal Involvement Pertinent to Current Situation/Hospitalization: No - Comment as needed  Activities of Daily  Living      Permission Sought/Granted                  Emotional Assessment Appearance:: Appears stated age Attitude/Demeanor/Rapport: Engaged Affect (typically observed): Accepting Orientation: : Oriented to Self, Oriented to Place, Oriented to  Time, Oriented to Situation   Psych Involvement: No (comment)  Admission diagnosis:  Multiple sclerosis ([place]) [G35] Multiple sclerosis exacerbation ([facility]) [G35] Generalized weakness [R53.1] Pain in right lower leg [M79.661] Patient Active Problem List   Diagnosis Date Noted   Fluid overload 07/06/2021   Chronic pain 07/06/2021   Dysesthesia 06/12/2021   Pain in both lower extremities 76/16/0737   Acute metabolic encephalopathy 10/62/6948   Hyperosmolar non-ketotic state due to type 2 diabetes mellitus ([place]) 12/10/2020   Leg wound, right, initial encounter 12/10/2020   Hyperglycemia 12/10/2020   Essential tremor 07/15/2020   Gait disturbance 06/13/2020   High risk medication use 12/08/2019   Left carpal tunnel syndrome 08/19/2012   Depression with anxiety 08/19/2012   Lumbar spondylosis 03/07/2012   Osteoarthrosis, unspecified whether generalized or localized, lower leg 03/07/2012   Diabetic peripheral neuropathy ([place]) 03/07/2012   SMOKER 06/02/2010   SIMPLE CHRONIC BRONCHITIS 06/02/2010   DYSPNEA 06/02/2010   SKIN RASH 08/05/2009   ACUTE CYSTITIS 05/03/2009   DM (diabetes mellitus), type 2 with complications ([place]) 54/62/7035   INSOMNIA 02/12/2009   Obesity, Class III, BMI 40-49.9 (morbid obesity) ([place]) 11/28/2008   LIVER  FUNCTION TESTS, ABNORMAL, HX OF 11/28/2008   DEGENERATION INTERVERTEBRAL DISC SITE UNSPEC 07/02/2008   Other chronic pain 05/22/2008   BLURRED VISION 05/22/2008   ESOPHAGEAL REFLUX 01/10/2008   Multiple sclerosis ([place]) 12/09/2007   URINARY RETENTION 12/09/2007   TACHYCARDIA 11/17/2007   Essential hypertension 11/08/2007   PCP:  Beverley Fiedler, FNP Pharmacy:   [facility] [phone] [address], [place] [address] [place] [address] Phone: [phone] Fax: [phone]  [facility] [place], [place] [address] [place] [address] Phone: [phone] Fax: [phone]  [place], [address] [place] [address] Phone: [phone] Fax: [phone]     Social Determinants of Health (SDOH) Interventions    Readmission Risk Interventions No flowsheet data found.

## 2021-07-08 NOTE — Progress Notes (Signed)
PT Cancellation Note  Patient Details Name: [name] MRN: [id number] DOB: 01/07/1961   Cancelled Treatment:    Reason Eval/Treat Not Completed: Patient declined, no reason specified. Pt stated "Ive already walked to [facility], sat on it for awhile while she mad my bed. I"m not walking today. I already moved around.' Pt educated on importance of progressing mobility however pt continued to decline. Acute PT to return as able.  Lewis Shock, PT, DPT Acute Rehabilitation Services Pager #: [phone] Office #: [phone]    Iona Hansen 07/08/2021, 2:22 PM

## 2021-07-08 NOTE — Care Management Obs Status (Signed)
MEDICARE OBSERVATION STATUS NOTIFICATION   Patient Details  Name: ADDIS BENNIE MRN: [id number] Date of Birth: [birth year]   Medicare Observation Status Notification Given:  Yes    Kermit Balo, RN 07/08/2021, 12:15 PM

## 2021-07-08 NOTE — NC FL2 (Signed)
[place] LEVEL OF CARE SCREENING TOOL     IDENTIFICATION  Patient Name: [name] Birthdate: 1960-09-19 Sex: female Admission Date (Current Location): 07/06/2021  [facility] and [place] Number:  Herbalist and Address:  [facility] [place], [place] [address], [place], [place] [address]      Provider Number: [id number]  Attending Physician Name and Address:  Cherene Altes, MD  Relative Name and Phone Number:       Current Level of Care: Hospital Recommended Level of Care: [place] Prior Approval Number:    Date Approved/Denied:   PASRR Number: [id number]  Discharge Plan: SNF    Current Diagnoses: Patient Active Problem List   Diagnosis Date Noted   Fluid overload 07/06/2021   Chronic pain 07/06/2021   Dysesthesia 06/12/2021   Pain in both lower extremities [id number]   Acute metabolic encephalopathy [id number]   Hyperosmolar non-ketotic state due to type 2 diabetes mellitus ([place]) 12/10/2020   Leg wound, right, initial encounter 12/10/2020   Hyperglycemia 12/10/2020   Essential tremor 07/15/2020   Gait disturbance 06/13/2020   High risk medication use 12/08/2019   Left carpal tunnel syndrome 08/19/2012   Depression with anxiety 08/19/2012   Lumbar spondylosis 03/07/2012   Osteoarthrosis, unspecified whether generalized or localized, lower leg 03/07/2012   Diabetic peripheral neuropathy ([place]) 03/07/2012   SMOKER 06/02/2010   SIMPLE CHRONIC BRONCHITIS 06/02/2010   DYSPNEA 06/02/2010   SKIN RASH 08/05/2009   ACUTE CYSTITIS 05/03/2009   DM (diabetes mellitus), type 2 with complications ([place]) [id number]   INSOMNIA 02/12/2009   Obesity, Class III, BMI 40-49.9 (morbid obesity) ([place]) 11/28/2008   LIVER FUNCTION TESTS, ABNORMAL, HX OF 11/28/2008   DEGENERATION INTERVERTEBRAL DISC SITE UNSPEC 07/02/2008   Other chronic pain 05/22/2008   BLURRED VISION 05/22/2008   ESOPHAGEAL REFLUX 01/10/2008   Multiple  sclerosis ([place]) 12/09/2007   URINARY RETENTION 12/09/2007   TACHYCARDIA 11/17/2007   Essential hypertension 11/08/2007    Orientation RESPIRATION BLADDER Height & Weight     Self, Time, Situation, Place  Normal Continent Weight:   Height:     BEHAVIORAL SYMPTOMS/MOOD NEUROLOGICAL BOWEL NUTRITION STATUS      Continent Diet (Carb modified with thin liquids)  AMBULATORY STATUS COMMUNICATION OF NEEDS Skin   Extensive Assist Verbally  (bilateral toe abrasions/ rash to abdomen)                       Personal Care Assistance Level of Assistance  Bathing, Feeding, Dressing Bathing Assistance: Limited assistance Feeding assistance: Independent Dressing Assistance: Limited assistance     Functional Limitations Info  Sight, Hearing, Speech Sight Info: Adequate Hearing Info: Adequate Speech Info: Adequate    SPECIAL CARE FACTORS FREQUENCY  PT (By licensed PT), OT (By licensed OT)     PT Frequency: 5x/wk OT Frequency: 5x/wk            Contractures Contractures Info: Not present    Additional Factors Info  Code Status, Allergies, Psychotropic, Insulin Sliding Scale Code Status Info: Full Allergies Info: NKA Psychotropic Info: Elavil 25 mg at bedtime/ Cymbalta Dr 60 mg BID/ Lamictal 200 mg BID Insulin Sliding Scale Info: Lantus 10 units SQ daily/ Novolog 0-9 units SQ every 4 hours       Current Medications (07/08/2021):  This is the current hospital active medication list Current Facility-Administered Medications  Medication Dose Route Frequency Provider Last Rate Last Admin   0.9 %  sodium chloride  infusion  250 mL Intravenous PRN Toy Baker, MD       acetaminophen (TYLENOL) tablet 650 mg  650 mg Oral Q6H PRN Toy Baker, MD       amantadine (SYMMETREL) capsule 100 mg  100 mg Oral BID Cherene Altes, MD   100 mg at 07/08/21 1005   amitriptyline (ELAVIL) tablet 25 mg  25 mg Oral QHS Cherene Altes, MD   25 mg at 07/07/21 2142   amLODipine  (NORVASC) tablet 5 mg  5 mg Oral Daily Toy Baker, MD   5 mg at 07/08/21 1005   atorvastatin (LIPITOR) tablet 80 mg  80 mg Oral QPM Toy Baker, MD   80 mg at 07/07/21 1816   bacitracin ointment   Topical BID Etta Quill, DO   Given at 07/08/21 1007   bisacodyl (DULCOLAX) suppository 10 mg  10 mg Rectal Daily PRN Toy Baker, MD       Chlorhexidine Gluconate Cloth 2 % PADS 6 each  6 each Topical Q0600 Cherene Altes, MD   6 each at 07/08/21 1007   [START ON 07/09/2021] dalfampridine TB12 10 mg  10 mg Oral BID Cherene Altes, MD       DULoxetine (CYMBALTA) DR capsule 60 mg  60 mg Oral BID Toy Baker, MD   60 mg at 07/08/21 1005   insulin aspart (novoLOG) injection 0-9 Units  0-9 Units Subcutaneous Q4H Toy Baker, MD   1 Units at 07/07/21 2355   insulin glargine (LANTUS) injection 10 Units  10 Units Subcutaneous Daily Toy Baker, MD   10 Units at 07/08/21 1005   lamoTRIgine (LAMICTAL) tablet 200 mg  200 mg Oral BID Joette Catching T, MD   200 mg at 07/08/21 1005   losartan (COZAAR) tablet 100 mg  100 mg Oral QHS Toy Baker, MD   100 mg at 07/07/21 2142   magnesium oxide (MAG-OX) tablet 400 mg  400 mg Oral Daily Joette Catching T, MD   400 mg at 07/08/21 1005   mupirocin ointment (BACTROBAN) 2 % 1 application  1 application Nasal BID Cherene Altes, MD   1 application at [id number]   naloxone (NARCAN) injection 0.4 mg  0.4 mg Intravenous PRN Toy Baker, MD       oxyCODONE (Oxy IR/ROXICODONE) immediate release tablet 10-15 mg  10-15 mg Oral Q6H PRN Cherene Altes, MD   15 mg at 07/08/21 0407   pantoprazole (PROTONIX) EC tablet 40 mg  40 mg Oral BID Cherene Altes, MD   40 mg at 07/08/21 1005   polyethylene glycol (MIRALAX / GLYCOLAX) packet 17 g  17 g Oral Daily PRN Doutova, Anastassia, MD       sodium chloride flush (NS) 0.9 % injection 3 mL  3 mL Intravenous Q12H Doutova, Anastassia, MD   3 mL at  07/08/21 1006   sodium chloride flush (NS) 0.9 % injection 3 mL  3 mL Intravenous PRN Toy Baker, MD         Discharge Medications: Please see discharge summary for a list of discharge medications.  Relevant Imaging Results:  Relevant Lab Results:   Additional Information SS#: [id number]  Pollie Friar, RN

## 2021-07-08 NOTE — Consult Note (Signed)
Neurology Consultation  Reason for Consult: Concern for MS exacerbation  Referring Physician: Dr. Sharon Seller  CC: Progressive weakness of the left lower extremity with frequent falls  History is obtained from: Patient, Chart review  HPI: [name] is a 61 y.o. female with a medical history significant for type 2 diabetes mellitus with neuropathy, gait disturbance, essential hypertension, multiple sclerosis diagnosed in 2008 on Tecfidera (follows with Dr. Epimenio Foot outpatient), and obesity with a BMI of 46.06 kg/m who presented to the ED on 2/19 for evaluation of frequent falls. The patient was last seen in clinic with Dr. Epimenio Foot on 06/12/2021 with complaints of progressive lower extremity weakness, more pronounced in the left lower extremity, and frequent falls with worsening balance. At her last office visit, her dalfampridine was discontinued and amantadine was initiated for gait stabilization without improvement. Over the past 1.5 weeks, the patient states that she has had 4 falls that she is having progressive trouble getting up from. She states that she is still using a cane at home because she received a walker that is not put together yet. She states that she had a fall on 2/19 and another fall on 2/20 after which she was unable to get up from independently. She states she typically is able to push herself up off of the ground and get up from her falls but after her fall on 2/20, she required her daughter's assistance to stand. She states that she last had an MS exacerbation treated with a full course of Solu-Medrol when she was living in [place] about 6 years ago with improvement in her symptoms and had one dose of Solu-Medrol IV in October of 2022 without improvement. She does endorse compliance with her medications at home. She states that since her fall on 2/19 she has had right-sided neck pain that she refers to as a throbbing headache with increasing pain with neck movement. Patient states that  this feels similar to exacerbations in the past that steroid treatments have improved.   ROS: A complete ROS was performed and is negative except as noted in the HPI.   Past Medical History:  Diagnosis Date   Diabetes mellitus    Hypertension    Multiple sclerosis exacerbation (HCC)    Neuropathy    Past Surgical History:  Procedure Laterality Date   ABDOMINAL HYSTERECTOMY     partial   I & D EXTREMITY Right 12/14/2020   Procedure: IRRIGATION AND DEBRIDEMENT RIGHT LOWER EXTREMITY;  Surgeon: Samson Frederic, MD;  Location: [facility] OR;  Service: Orthopedics;  Laterality: Right;   JOINT REPLACEMENT     LTK   RADIOLOGY WITH ANESTHESIA N/A 02/18/2021   Procedure: MRI WITH ANESTHESIA CERVICAL SPINE WITH AND WITHOUT CONTRAST AND BRAIN WITH AND WITHOUT CONTRAST;  Surgeon: Radiologist, Medication, MD;  Location: [facility] OR;  Service: Radiology;  Laterality: N/A;   RADIOLOGY WITH ANESTHESIA N/A 07/07/2021   Procedure: MRI WITH ANESTHESIA;  Surgeon: Radiologist, Medication, MD;  Location: [facility] OR;  Service: Radiology;  Laterality: N/A;   SACRAL NERVE STIMULATOR PLACEMENT     [employer]   TUBAL LIGATION     Family History  Problem Relation Age of Onset   Diabetes Mother    Hypertension Mother    Stroke Father    Social History:   reports that she has been smoking cigarettes. She has a 3.00 pack-year smoking history. She has never used smokeless tobacco. She reports that she does not drink alcohol and does not use drugs.  Medications  Current  Facility-Administered Medications:    0.9 %  sodium chloride infusion, 250 mL, Intravenous, PRN, Doutova, Anastassia, MD   acetaminophen (TYLENOL) tablet 650 mg, 650 mg, Oral, Q6H PRN **OR** [DISCONTINUED] acetaminophen (TYLENOL) suppository 650 mg, 650 mg, Rectal, Q6H PRN, Doutova, Anastassia, MD   amantadine (SYMMETREL) capsule 100 mg, 100 mg, Oral, BID, Lonia Blood, MD, 100 mg at 07/08/21 1005   amitriptyline (ELAVIL) tablet 25 mg, 25 mg, Oral, QHS,  Lonia Blood, MD, 25 mg at 07/07/21 2142   amLODipine (NORVASC) tablet 5 mg, 5 mg, Oral, Daily, Doutova, Anastassia, MD, 5 mg at 07/08/21 1005   atorvastatin (LIPITOR) tablet 80 mg, 80 mg, Oral, QPM, Doutova, Anastassia, MD, 80 mg at 07/07/21 1816   bacitracin ointment, , Topical, BID, Hillary Bow, DO, Given at 07/08/21 1007   bisacodyl (DULCOLAX) suppository 10 mg, 10 mg, Rectal, Daily PRN, Therisa Doyne, MD   Chlorhexidine Gluconate Cloth 2 % PADS 6 each, 6 each, Topical, Q0600, Lonia Blood, MD, 6 each at 07/08/21 1007   [START ON 07/09/2021] dalfampridine TB12 10 mg, 10 mg, Oral, BID, Lonia Blood, MD   DULoxetine (CYMBALTA) DR capsule 60 mg, 60 mg, Oral, BID, Doutova, Anastassia, MD, 60 mg at 07/08/21 1005   insulin aspart (novoLOG) injection 0-9 Units, 0-9 Units, Subcutaneous, Q4H, Doutova, Anastassia, MD, 1 Units at 07/07/21 2355   insulin glargine (LANTUS) injection 10 Units, 10 Units, Subcutaneous, Daily, Doutova, Anastassia, MD, 10 Units at 07/08/21 1005   lamoTRIgine (LAMICTAL) tablet 200 mg, 200 mg, Oral, BID, Lonia Blood, MD, 200 mg at 07/08/21 1005   losartan (COZAAR) tablet 100 mg, 100 mg, Oral, QHS, Doutova, Anastassia, MD, 100 mg at 07/07/21 2142   magnesium oxide (MAG-OX) tablet 400 mg, 400 mg, Oral, Daily, Jetty Duhamel T, MD, 400 mg at 07/08/21 1005   mupirocin ointment (BACTROBAN) 2 % 1 application, 1 application, Nasal, BID, Lonia Blood, MD, 1 application at 07/08/21 1007   naloxone (NARCAN) injection 0.4 mg, 0.4 mg, Intravenous, PRN, Doutova, Anastassia, MD   oxyCODONE (Oxy IR/ROXICODONE) immediate release tablet 10-15 mg, 10-15 mg, Oral, Q6H PRN, Lonia Blood, MD, 15 mg at 07/08/21 1237   pantoprazole (PROTONIX) EC tablet 40 mg, 40 mg, Oral, BID, Lonia Blood, MD, 40 mg at 07/08/21 1005   polyethylene glycol (MIRALAX / GLYCOLAX) packet 17 g, 17 g, Oral, Daily PRN, Doutova, Anastassia, MD   sodium chloride flush (NS)  0.9 % injection 3 mL, 3 mL, Intravenous, Q12H, Doutova, Anastassia, MD, 3 mL at 07/08/21 1006   sodium chloride flush (NS) 0.9 % injection 3 mL, 3 mL, Intravenous, PRN, Adela Glimpse, Anastassia, MD  Exam: Current vital signs: BP 139/74 (BP Location: Left Arm)    Pulse 94    Temp 98.4 F (36.9 C) (Oral)    Resp 20    SpO2 95%  Vital signs in last 24 hours: Temp:  [98.2 F (36.8 C)-99.6 F (37.6 C)] 98.4 F (36.9 C) (02/21 1110) Pulse Rate:  [89-100] 94 (02/21 1110) Resp:  [17-20] 20 (02/21 1110) BP: (123-143)/(65-77) 139/74 (02/21 1110) SpO2:  [91 %-100 %] 95 % (02/21 1110)  GENERAL: Awake, alert, in no acute distress Psych: Affect appropriate for situation, patient is calm and cooperative with examination Head: Normocephalic and atraumatic, without obvious abnormality EENT: Normal conjunctivae, dry mucous membranes, no OP obstruction LUNGS: Normal respiratory effort. Non-labored breathing on room air CV: Regular rate and rhythm on telemetry ABDOMEN: Soft, non-tender, non-distended Extremities: Warm, well perfused, without  obvious deformity. She does have an abrasion to the left knee and right great toe both covered by dressings.   NEURO:  Mental Status: Awake, alert, and oriented to person, place, time, and situation. She is able to provide a clear and coherent history of present illness. Speech/Language: speech is fluent without dysarthria.   Naming, repetition, fluency, and comprehension intact without aphasia. No neglect is noted Cranial Nerves:  II: PERRL 3 mm/brisk. Visual fields full.  III, IV, VI: EOMI without ptosis, gaze preference, or nystagmus. Patient endorses diplopia this morning that improved with closing one eye and improved with wearing her glasses. V: Sensation is intact to light touch and symmetrical to face. VII: Face is symmetric resting and smiling.  VIII: Hearing is intact to voice IX, X: Palate elevation is symmetric. Phonation normal.  XI: Normal  sternocleidomastoid and trapezius muscle strength XII: Tongue protrudes midline without fasciculations.   Motor: 5/5 strength present in bilateral upper extremities without vertical drift.  Patient's right lower extremity is 5/5 at the right ankle and 4+/5 at the knee and hip. Patient endorses subjective weakness of the left lower extremity as well as 4/5 strength with left ankle plantar/dorsiflexion and 4/5 strength proximally.  Tone is normal. Bulk is normal.  Sensation: Sensation to light touch intact and symmetric to the bilateral upper extremities.  Patient endorses tingling/burning to bilateral feet, decreased sensation to light touch of bilateral lower extremities left > right. Sensation to cool temperature absent to bilateral lower extremities distal to the knees, above the knees, cool temperature sensation is intact on the right and felt as warm on the left lower extremity.  Sensation to vibration is intact on the right toe, absent on the right ankle, present on the right knee. Sensation to vibration is absent on the left foot and left knee. She is able to feel vibration on the left thumb but not on the right. Patient splits midline to vibration on the forehead and notes that she can feel vibration on the left but not the right.  Coordination: FTN and HKS intact bilaterally without obvious dysmetria.  DTRs: 1+ and symmetric throughout.  Gait: Deferred for patient safety with multiple recent falls reported at home.   Labs I have reviewed labs in epic and the results pertinent to this consultation are: CBC    Component Value Date/Time   WBC 8.2 07/07/2021 0252   RBC 4.15 07/07/2021 0252   HGB 12.8 07/07/2021 0252   HGB 15.4 11/13/2020 1335   HCT 37.3 07/07/2021 0252   HCT 44.5 11/13/2020 1335   PLT 253 07/07/2021 0252   PLT 291 11/13/2020 1335   MCV 89.9 07/07/2021 0252   MCV 87 11/13/2020 1335   MCH 30.8 07/07/2021 0252   MCHC 34.3 07/07/2021 0252   RDW 12.4 07/07/2021 0252    RDW 13.1 11/13/2020 1335   LYMPHSABS 3.3 07/07/2021 0252   LYMPHSABS 2.4 11/13/2020 1335   MONOABS 0.6 07/07/2021 0252   EOSABS 0.1 07/07/2021 0252   EOSABS 0.0 11/13/2020 1335   BASOSABS 0.0 07/07/2021 0252   BASOSABS 0.0 11/13/2020 1335   CMP     Component Value Date/Time   NA 140 07/08/2021 0158   K 4.1 07/08/2021 0158   CL 105 07/08/2021 0158   CO2 27 07/08/2021 0158   GLUCOSE 94 07/08/2021 0158   BUN 11 07/08/2021 0158   CREATININE 0.97 07/08/2021 0158   CREATININE 0.85 02/24/2012 1044   CALCIUM 9.4 07/08/2021 0158   PROT 6.5 07/07/2021 0252  ALBUMIN 3.6 07/07/2021 0252   AST 29 07/07/2021 0252   ALT 23 07/07/2021 0252   ALKPHOS 64 07/07/2021 0252   BILITOT 0.2 (L) 07/07/2021 0252   GFRNONAA >60 07/08/2021 0158   GFRAA >60 11/17/2019 1242   Lipid Panel     Component Value Date/Time   CHOL (H) 02/19/2009 0325    237        ATP III CLASSIFICATION:  <200     mg/dL   Desirable  284-132  mg/dL   Borderline High  >=440    mg/dL   High          TRIG 102 (H) 02/19/2009 0325   HDL 52 02/19/2009 0325   CHOLHDL 4.6 02/19/2009 0325   VLDL 50 (H) 02/19/2009 0325   LDLCALC (H) 02/19/2009 0325    135        Total Cholesterol/HDL:CHD Risk Coronary Heart Disease Risk Table                     Men   Women  1/2 Average Risk   3.4   3.3  Average Risk       5.0   4.4  2 X Average Risk   9.6   7.1  3 X Average Risk  23.4   11.0        Use the calculated Patient Ratio above and the CHD Risk Table to determine the patient's CHD Risk.        ATP III CLASSIFICATION (LDL):  <100     mg/dL   Optimal  725-366  mg/dL   Near or Above                    Optimal  130-159  mg/dL   Borderline  440-347  mg/dL   High  >425     mg/dL   Very High   Lab Results  Component Value Date   HGBA1C 5.5 07/06/2021   Imaging I have reviewed the images obtained:  MRI examination of the brain w+wo 2/20: Unchanged cerebral white matter disease consistent with the history of multiple  sclerosis. No evidence of active demyelination or other acute intracranial abnormality.  MRI cervical spine w+wo 2/20: 1. Faint chronic signal abnormality in the upper cervical spinal cord consistent with the history of multiple sclerosis. No convincing new lesions or evidence of active demyelination. 2. Unchanged cervical disc and facet degeneration resulting in mild spinal stenosis from C4-5 to C6-7. 3. Moderate to severe multilevel neural foraminal stenosis as above.  MRI thoracic spine w+wo 2/20: Unchanged spinal cord lesion at T6 with volume loss consistent with chronic multiple sclerosis. No definite new thoracic cord lesions or evidence of active demyelination.  Assessment: 61 y.o. female with history of MS on Tecfidera with progressive weakness and frequent falls who presented to the ED on 2/19 for evaluation of multiple falls at home and progressive weakness.  - Examination reveals patient with left lower extremity > right lower extremity weakness, as well as left > right upper extremity weakness on exam with subjective weakness reported and variable and inconsistent sensory deficits reported throughout.  - Imaging as above is without evidence of active demyelination. However, clinically the patient's presentation is most consistent with an MS exacerbation - Also a likely contributing factor is progressive functional decline as she has reported frequent falls and weakness over several months per chart review (last outpatient neurology visit with Dr. Epimenio Foot in January 2023). Patient does express that her  current presentation is consistent with MS exacerbations from the past and states that IV Solu-Medrol has helped relieve these symptoms in the past. Patient does request Solu-Medrol treatment at this time.   Recommendations: - Starting pulsed-dose steroids. IV Solumedrol 1000 mg qd x 5 days - Monitor CBG, CBC and chem7 daily during IV steroid treatment - PT/OT   Pt seen by NP/Neuro    Lanae Boast, AGAC-NP [facility] Pager: [phone]  I have seen and examined the patient. I have formulated the assessment and plan. 61 year old female with MS diagnosed in 2008, patient of Dr. Epimenio Foot, on Tecfidera, presenting with symptoms of acute MS exacerbation. Exam reveals BLE weakness and left worse than right upper extremity weakness. Also with peripheral neuropathic findings which patient states predate her diagnosis of MS. Recommendations as above.  Electronically signed: Dr. Caryl Pina

## 2021-07-08 NOTE — Progress Notes (Signed)
[name]  ZOX:[id number] DOB: [birth year] DOA: 07/06/2021 PCP: Felix Pacini, FNP    Brief Narrative:  [phone] with a history of multiple sclerosis, HTN, DM2, obesity, and HLD who presented to the ED after a fall and further reporting numbness of the legs bilaterally for 4 days with associated progressive weakness and numbness.  CT head in the ED revealed no acute findings.  Neurology was contacted and recommended an MRI, for which the patient has required sedation previously.  Consultants:  Neurology  Code Status: FULL CODE  Antimicrobials:  None  DVT prophylaxis: SCDs  Interim Hx: Afebrile.  Vital signs stable.  Sedated MRI brain thoracic and cervical spine yesterday revealed no evidence of acute demyelination or other acute intracranial abnormalities or active demyelination of the spine.  Reports ongoing bilateral lower extremity weakness and paresthesias.  Also reports intermittent diplopia.  States the symptoms are consistent with her prior MS flares.  Assessment & Plan:  Bilateral lower extremity numbness with progressive weakness MRI brain cervical and thoracic spine without evidence to suggest acute demyelination, but patient states symptoms are entirely consistent with prior MS flares which have responded well to Solu-Medrol -will ask neurology to evaluate  DM 2 with peripheral neuropathy A1c 5.5 -CBG well controlled at this time  Hypokalemia Likely simply due to poor intake -corrected with supplementation  Obesity  Essential HTN Blood pressure reasonably controlled -follow trend without change in treatment today  Possible pulmonary edema - ruled out  Mentioned on admission CXR -TTE this admit notes EF 60-65% with no WMA and normal diastolic parameters with no appreciable valve abnormalities  Chronic pain Resume usual pain regimen and monitor for sedation  Family Communication: No family present at time of exam today Disposition: From home -not yet  medically stable but anticipate return home  Objective: Blood pressure 139/74, pulse 94, temperature 98.4 F (36.9 C), temperature source Oral, resp. rate 20, SpO2 95 %.  Intake/Output Summary (Last 24 hours) at 07/08/2021 1629 Last data filed at 07/08/2021 1006 Gross per 24 hour  Intake 603 ml  Output 800 ml  Net -197 ml    There were no vitals filed for this visit.  Examination: General: No acute respiratory distress Lungs: Distant breath sounds related to body habitus but clear with no wheezing Cardiovascular: Distant heart sounds but regular without murmur Abdomen: Nontender, nondistended, obese, soft, bowel sounds positive, no rebound Extremities: Trace bilateral lower extremity edema  CBC: Recent Labs  Lab 07/06/21 1343 07/06/21 2201 07/07/21 0252  WBC 7.3 7.8 8.2  NEUTROABS 5.3 4.5 4.2  HGB 12.6 13.5 12.8  HCT 37.5 40.0 37.3  MCV 90.6 91.5 89.9  PLT 236 251 253    Basic Metabolic Panel: Recent Labs  Lab 07/06/21 1343 07/06/21 2201 07/07/21 0252 07/08/21 0158  NA 140  --  140 140  K 3.4*  --  3.0* 4.1  CL 107  --  104 105  CO2 27  --  27 27  GLUCOSE 104*  --  104* 94  BUN 20  --  11 11  CREATININE 0.97  --  0.79 0.97  CALCIUM 9.3  --  9.4 9.4  MG  --  2.2 2.2 2.3  PHOS  --  3.5 2.8  --     GFR: CrCl cannot be calculated (Unknown ideal weight.).  Liver Function Tests: Recent Labs  Lab 07/06/21 1343 07/06/21 2201 07/07/21 0252  AST 29 32 29  ALT 24 25 23   ALKPHOS 57 64 64  BILITOT 0.2* 0.5 0.2*  PROT 6.9 7.2 6.5  ALBUMIN 4.0 4.0 3.6    Cardiac Enzymes: Recent Labs  Lab 07/06/21 2201 07/08/21 0158  CKTOTAL 703* 339*     HbA1C: Hgb A1c MFr Bld  Date/Time Value Ref Range Status  07/06/2021 10:01 PM 5.5 4.8 - 5.6 % Final    Comment:    REPEATED TO VERIFY (NOTE) Pre diabetes:          5.7%-6.4%  Diabetes:              >6.4%  Glycemic control for   <7.0% adults with diabetes   12/10/2020 08:22 AM 10.6 (H) 4.8 - 5.6 % Final     Comment:    (NOTE)         Prediabetes: 5.7 - 6.4         Diabetes: >6.4         Glycemic control for adults with diabetes: <7.0     Scheduled Meds:  amantadine  100 mg Oral BID   amitriptyline  25 mg Oral QHS   amLODipine  5 mg Oral Daily   atorvastatin  80 mg Oral QPM   bacitracin   Topical BID   Chlorhexidine Gluconate Cloth  6 each Topical Q0600   [START ON 07/09/2021] dalfampridine  10 mg Oral BID   DULoxetine  60 mg Oral BID   insulin aspart  0-9 Units Subcutaneous Q4H   insulin glargine  10 Units Subcutaneous Daily   lamoTRIgine  200 mg Oral BID   losartan  100 mg Oral QHS   magnesium oxide  400 mg Oral Daily   mupirocin ointment  1 application Nasal BID   pantoprazole  40 mg Oral BID   sodium chloride flush  3 mL Intravenous Q12H      LOS: 0 days   Lonia Blood, MD [facility] Office  [phone] Pager - Text Page per Loretha Stapler  If 7PM-7AM, please contact night-coverage per Amion 07/08/2021, 4:29 PM

## 2021-07-09 ENCOUNTER — Ambulatory Visit: Payer: Medicare Other

## 2021-07-09 ENCOUNTER — Telehealth: Payer: Self-pay | Admitting: Neurology

## 2021-07-09 DIAGNOSIS — Z8249 Family history of ischemic heart disease and other diseases of the circulatory system: Secondary | ICD-10-CM | POA: Diagnosis not present

## 2021-07-09 DIAGNOSIS — E1165 Type 2 diabetes mellitus with hyperglycemia: Secondary | ICD-10-CM | POA: Diagnosis not present

## 2021-07-09 DIAGNOSIS — F17210 Nicotine dependence, cigarettes, uncomplicated: Secondary | ICD-10-CM | POA: Diagnosis present

## 2021-07-09 DIAGNOSIS — Z9682 Presence of neurostimulator: Secondary | ICD-10-CM | POA: Diagnosis not present

## 2021-07-09 DIAGNOSIS — G35 Multiple sclerosis: Secondary | ICD-10-CM | POA: Diagnosis present

## 2021-07-09 DIAGNOSIS — R296 Repeated falls: Secondary | ICD-10-CM | POA: Diagnosis present

## 2021-07-09 DIAGNOSIS — Z20822 Contact with and (suspected) exposure to covid-19: Secondary | ICD-10-CM | POA: Diagnosis present

## 2021-07-09 DIAGNOSIS — E1142 Type 2 diabetes mellitus with diabetic polyneuropathy: Secondary | ICD-10-CM | POA: Diagnosis present

## 2021-07-09 DIAGNOSIS — N390 Urinary tract infection, site not specified: Secondary | ICD-10-CM | POA: Diagnosis present

## 2021-07-09 DIAGNOSIS — B9620 Unspecified Escherichia coli [E. coli] as the cause of diseases classified elsewhere: Secondary | ICD-10-CM | POA: Diagnosis present

## 2021-07-09 DIAGNOSIS — Z22322 Carrier or suspected carrier of Methicillin resistant Staphylococcus aureus: Secondary | ICD-10-CM | POA: Diagnosis not present

## 2021-07-09 DIAGNOSIS — W19XXXA Unspecified fall, initial encounter: Secondary | ICD-10-CM | POA: Diagnosis present

## 2021-07-09 DIAGNOSIS — I10 Essential (primary) hypertension: Secondary | ICD-10-CM | POA: Diagnosis present

## 2021-07-09 DIAGNOSIS — Z90711 Acquired absence of uterus with remaining cervical stump: Secondary | ICD-10-CM | POA: Diagnosis not present

## 2021-07-09 DIAGNOSIS — Z794 Long term (current) use of insulin: Secondary | ICD-10-CM | POA: Diagnosis not present

## 2021-07-09 DIAGNOSIS — Z6841 Body Mass Index (BMI) 40.0 and over, adult: Secondary | ICD-10-CM | POA: Diagnosis not present

## 2021-07-09 DIAGNOSIS — E785 Hyperlipidemia, unspecified: Secondary | ICD-10-CM | POA: Diagnosis present

## 2021-07-09 DIAGNOSIS — Z79899 Other long term (current) drug therapy: Secondary | ICD-10-CM | POA: Diagnosis not present

## 2021-07-09 DIAGNOSIS — E876 Hypokalemia: Secondary | ICD-10-CM | POA: Diagnosis present

## 2021-07-09 DIAGNOSIS — G8929 Other chronic pain: Secondary | ICD-10-CM | POA: Diagnosis present

## 2021-07-09 DIAGNOSIS — Z96652 Presence of left artificial knee joint: Secondary | ICD-10-CM | POA: Diagnosis present

## 2021-07-09 LAB — GLUCOSE, CAPILLARY
Glucose-Capillary: 181 mg/dL — ABNORMAL HIGH (ref 70–99)
Glucose-Capillary: 145 mg/dL — ABNORMAL HIGH (ref 70–99)
Glucose-Capillary: 191 mg/dL — ABNORMAL HIGH (ref 70–99)
Glucose-Capillary: 116 mg/dL — ABNORMAL HIGH (ref 70–99)
Glucose-Capillary: 139 mg/dL — ABNORMAL HIGH (ref 70–99)
Glucose-Capillary: 195 mg/dL — ABNORMAL HIGH (ref 70–99)

## 2021-07-09 MED ORDER — GABAPENTIN 800 MG PO TABS
800.0000 mg | ORAL_TABLET | Freq: Two times a day (BID) | ORAL | Status: DC
  Filled 2021-07-09: qty 1

## 2021-07-09 MED ORDER — GABAPENTIN 400 MG PO CAPS
800.0000 mg | ORAL_CAPSULE | Freq: Two times a day (BID) | ORAL | Status: DC
  Administered 2021-07-09 – 2021-07-16 (×15): 800 mg via ORAL
  Filled 2021-07-09 (×15): qty 2

## 2021-07-09 MED ORDER — GABAPENTIN 600 MG PO TABS: 300.0000 mg | ORAL_TABLET | Freq: Three times a day (TID) | ORAL | Status: DC

## 2021-07-09 MED ORDER — TECFIDERA 240 MG PO CPDR: 240.0000 mg | DELAYED_RELEASE_CAPSULE | Freq: Two times a day (BID) | ORAL | 3 refills | Status: DC

## 2021-07-09 MED ORDER — INSULIN GLARGINE 100 UNIT/ML ~~LOC~~ SOLN
40.0000 [IU] | Freq: Every day | SUBCUTANEOUS | Status: DC
  Administered 2021-07-10 – 2021-07-13 (×4): 40 [IU] via SUBCUTANEOUS
  Filled 2021-07-09 (×4): qty 0.4

## 2021-07-09 NOTE — Telephone Encounter (Signed)
LVM returning pt call. 

## 2021-07-09 NOTE — Progress Notes (Signed)
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 Paged Dr. Julian Reil. Precautions were already in placed.

## 2021-07-09 NOTE — Telephone Encounter (Signed)
Pt has called from the hospital to inform she will be there for another 4-5 days and then going to Rehab.  Pt states the specialty pharmacy ([facility]) is telling her they need a new prescription, the hospital is telling her that they need an actual prescription from pt's Neurologist.  Pt is asking if Dr Epimenio Foot will manage her TECFIDERA 240 MG CPDR and send in  a Rx to [employer] technical [employer]) Development worker, community)

## 2021-07-09 NOTE — Plan of Care (Signed)
  Problem: Education: Goal: Knowledge of General Education information will improve Description: Including pain rating scale, medication(s)/side effects and non-pharmacologic comfort measures Outcome: Progressing   Problem: Pain Managment: Goal: General experience of comfort will improve Outcome: Progressing   Problem: Safety: Goal: Ability to remain free from injury will improve Outcome: Progressing   

## 2021-07-09 NOTE — Progress Notes (Signed)
Occupational Therapy Treatment Patient Details Name: [name] MRN: [id number] DOB: 09-Jan-1961 Today's Date: 07/09/2021   History of present illness [name] is a 61 yo female who presented with generalized weakness, bilateral leg numbness tingling and falls over last couple days. Pending MRI work up to confirm MS flare. Medical history significant of  MS, HTN , HLD   OT comments  [name] is progressing well. She tolerated ADLs at the sink in sitting and standing with min A overall. She remains limited by poor activity tolerance, paresthesias in bilat feet and hands, BUE weakness and impaired balance. Pt is motivated to progress and d/c remains appropriate. OT to continue to follow.    Recommendations for follow up therapy are one component of a multi-disciplinary discharge planning process, led by the attending physician.  Recommendations may be updated based on patient status, additional functional criteria and insurance authorization.    Follow Up Recommendations  Acute inpatient rehab (3hours/day)    Assistance Recommended at Discharge Frequent or constant Supervision/Assistance  Patient can return home with the following  A little help with walking and/or transfers;A little help with bathing/dressing/bathroom;Assistance with cooking/housework;Assist for transportation;Help with stairs or ramp for entrance   Equipment Recommendations  Other (comment)    Recommendations for Other Services Rehab consult    Precautions / Restrictions Precautions Precautions: Fall Restrictions Weight Bearing Restrictions: No       Mobility Bed Mobility               General bed mobility comments: in chiar upon arrival    Transfers Overall transfer level: Needs assistance Equipment used: Rolling walker (2 wheels) Transfers: Sit to/from Stand Sit to Stand: Min guard           General transfer comment: verbal cues for safe hand placement     Balance Overall balance  assessment: Needs assistance Sitting-balance support: No upper extremity supported, Feet supported Sitting balance-Leahy Scale: Good Sitting balance - Comments: bathed at sink in sitting bimanually   Standing balance support: Single extremity supported, During functional activity Standing balance-Leahy Scale: Fair Standing balance comment: statically stood at the sink with 1 UE suppoted to bathe peri area                           ADL either performed or assessed with clinical judgement   ADL Overall ADL's : Needs assistance/impaired Eating/Feeding: Independent;Sitting   Grooming: Modified independent;Sitting Grooming Details (indicate cue type and reason): sitting at the sink Upper Body Bathing: Supervision/ safety;Sitting Upper Body Bathing Details (indicate cue type and reason): sitting at the sink Lower Body Bathing: Minimal assistance;Sit to/from stand Lower Body Bathing Details (indicate cue type and reason): at the sink, using sink ledge& recliner to help with transfers Upper Body Dressing : Set up;Sitting   Lower Body Dressing: Moderate assistance;Sit to/from stand               Functional mobility during ADLs: Minimal assistance;Min guard General ADL Comments: ADLs at the sink this session with sitting & standing. With fatigue, pt required more physical assist to safety transfer. continues to report pins & needles in extremeties & weakness    Extremity/Trunk Assessment Upper Extremity Assessment Upper Extremity Assessment: Defer to OT evaluation RUE Deficits / Details: Full ROM, globally 4/5, paresthesia with pins & needles tingling RUE Sensation: decreased light touch RUE Coordination: decreased fine motor LUE Deficits / Details: Full ROM, globally 4-/5, paresthesia with pins & needles  tingling LUE Sensation: decreased light touch LUE Coordination: decreased fine motor   Lower Extremity Assessment Lower Extremity Assessment: Defer to PT evaluation         Vision       Perception Perception Perception: Not tested   Praxis Praxis Praxis: Not tested    Cognition Arousal/Alertness: Awake/alert Behavior During Therapy: WFL for tasks assessed/performed Overall Cognitive Status: Within Functional Limits for tasks assessed                                          Exercises      Shoulder Instructions       General Comments VSS on RA    Pertinent Vitals/ Pain       Pain Assessment Pain Assessment: Faces Faces Pain Scale: Hurts little more Pain Location: bilat feet with standing Pain Descriptors / Indicators: Burning Pain Intervention(s): Limited activity within patient's tolerance, Monitored during session  Home Living                                          Prior Functioning/Environment              Frequency  Min 2X/week        Progress Toward Goals  OT Goals(current goals can now be found in the care plan section)  Progress towards OT goals: Progressing toward goals  Acute Rehab OT Goals Patient Stated Goal: get better OT Goal Formulation: With patient Time For Goal Achievement: 07/21/21 Potential to Achieve Goals: Good ADL Goals Pt Will Perform Grooming: standing Pt Will Perform Lower Body Dressing: with modified independence;sit to/from stand Pt Will Transfer to Toilet: with modified independence Additional ADL Goal #1: Pt will indep recall at least 3 fall prevention strategies to apply to the home setting  Plan Discharge plan remains appropriate    Co-evaluation                 AM-PAC OT "6 Clicks" Daily Activity     Outcome Measure   Help from another person eating meals?: None Help from another person taking care of personal grooming?: A Little Help from another person toileting, which includes using toliet, bedpan, or urinal?: A Little Help from another person bathing (including washing, rinsing, drying)?: A Little Help from another person  to put on and taking off regular upper body clothing?: A Little Help from another person to put on and taking off regular lower body clothing?: A Little 6 Click Score: 19    End of Session Equipment Utilized During Treatment: Rolling walker (2 wheels)  OT Visit Diagnosis: Other abnormalities of gait and mobility (R26.89);Unsteadiness on feet (R26.81);Repeated falls (R29.6);Muscle weakness (generalized) (M62.81);History of falling (Z91.81);Pain   Activity Tolerance Patient tolerated treatment well   Patient Left in bed;with call bell/phone within reach;with bed alarm set;with nursing/sitter in room   Nurse Communication Mobility status        Time: 1541 (1601)-0932 OT Time Calculation (min): 33 min  Charges: OT General Charges $OT Visit: 1 Visit OT Treatments $Self Care/Home Management : 8-22 mins   Preslie Depasquale A Labrandon Knoch 07/09/2021, 5:19 PM

## 2021-07-09 NOTE — Telephone Encounter (Signed)
Took call from phone staff and spoke with pt. Prior to being admitted, she had fallen 4x in 1.5 week timeframe. Last fall, she was unable to get up. Was too weak. Daughter ended up bringing her to hospital. She was admitted to hospital on 07/06/21. Will be there another 4-5 days. They are planning on 5 days IV steroids. Day 1 was yesterday.  She confirmed she got WC and OT at hospital put it together for her while she has been admitted.  She has 9 caps of Tecfidera left and needs refill sent to [facility] specialty pharmacy. I e-scribed refill. I offered to make sooner f/u than July 2022. She decided to keep this for now. Once she knows when she will be d/c'd from rehab, she will call back if she wants sooner appt.

## 2021-07-09 NOTE — Plan of Care (Signed)
  Problem: Health Behavior/Discharge Planning: Goal: Ability to manage health-related needs will improve Outcome: Not Progressing   Problem: Clinical Measurements: Goal: Ability to maintain clinical measurements within normal limits will improve Outcome: Not Progressing   

## 2021-07-09 NOTE — Progress Notes (Signed)
PROGRESS NOTE    [name]  WUJ:[id number] DOB: 08-04-1960 DOA: 07/06/2021 PCP: Felix Pacini, FNP    Chief Complaint  Patient presents with   Weakness   Fall   Foot Injury    Brief Narrative:   [phone] with a history of multiple sclerosis, HTN, DM2, obesity, and HLD who presented to the ED after a fall and further reporting numbness of the legs bilaterally for 4 days with associated progressive weakness and numbness.  CT head in the ED revealed no acute findings.  Neurology was contacted and recommended an MRI,.  Assessment & Plan:   Principal Problem:   Multiple sclerosis (HCC) Active Problems:   DM (diabetes mellitus), type 2 with complications (HCC)   Obesity, Class III, BMI 40-49.9 (morbid obesity) (HCC)   Essential hypertension   Diabetic peripheral neuropathy (HCC)   Fluid overload   Chronic pain   Bilateral lower extremity numbness with progressive weakness MRI of the brain, cervical and thoracic spine without evidence to suggest acute demyelination. She was started on IV steroids.  Therapy evaluations ordered.  Started her on gabapentin for the peripheral neuropathy .     Hypertension:  BP parameters are optimal.  Continue with Norvasc   Type 2 diabetes mellitus Insulin-dependent Restart home medications. Uncontrolled with hyperglycemia. Continue with sliding scale insulin.    Hyperlipidemia Continue with Lipitor   DVT prophylaxis: (Lovenox/) Code Status: (full code) Family Communication: none at bedside.  Disposition:   Status is: Inpatient  Remains inpatient appropriate because: IV steroids.      Consultants:  Neurology.   Procedures: none.   Antimicrobials: none.     Subjective: Burning sensation of her feet.   Objective: Vitals:   07/08/21 2307 07/09/21 0341 07/09/21 0715 07/09/21 1127  BP: 134/67 130/79 133/70 135/74  Pulse: 92 88 86 99  Resp: Temp: 98.6 F (37 C)  98.4 F (36.9 C) 98.6 F (37 C)   TempSrc: Oral  Oral Oral  SpO2: 94% 94% 95% 95%    Intake/Output Summary (Last 24 hours) at 07/09/2021 1449 Last data filed at 07/09/2021 1234 Gross per 24 hour  Intake 66 ml  Output 1300 ml  Net -1234 ml   There were no vitals filed for this visit.  Examination:  General exam: Appears calm and comfortable  Respiratory system: Clear to auscultation. Respiratory effort normal. Cardiovascular system: S1 & S2 heard, RRR. No JVD, No pedal edema. Gastrointestinal system: Abdomen is nondistended, soft and nontender. Normal bowel sounds heard. Central nervous system: Alert and oriented. No focal neurological deficits. Extremities: Symmetric 5 x 5 power. Skin: No rashes, lesions or ulcers Psychiatry:  Mood & affect appropriate.     Data Reviewed: I have personally reviewed following labs and imaging studies  CBC: Recent Labs  Lab 07/06/21 1343 07/06/21 2201 07/07/21 0252  WBC 7.3 7.8 8.2  NEUTROABS 5.3 4.5 4.2  HGB 12.6 13.5 12.8  HCT 37.5 40.0 37.3  MCV 90.6 91.5 89.9  PLT 236 251 253    Basic Metabolic Panel: Recent Labs  Lab 07/06/21 1343 07/06/21 2201 07/07/21 0252 07/08/21 0158  NA 140  --  140 140  K 3.4*  --  3.0* 4.1  CL 107  --  104 105  CO2 27  --  27 27  GLUCOSE 104*  --  104* 94  BUN 20  --  11 11  CREATININE 0.97  --  0.79 0.97  CALCIUM 9.3  --  9.4  9.4  MG  --  2.2 2.2 2.3  PHOS  --  3.5 2.8  --     GFR: CrCl cannot be calculated (Unknown ideal weight.).  Liver Function Tests: Recent Labs  Lab 07/06/21 1343 07/06/21 2201 07/07/21 0252  AST 29 32 29  ALT 24 25 23   ALKPHOS 57 64 64  BILITOT 0.2* 0.5 0.2*  PROT 6.9 7.2 6.5  ALBUMIN 4.0 4.0 3.6    CBG: Recent Labs  Lab 07/08/21 2008 07/08/21 2304 07/09/21 0341 07/09/21 0810 07/09/21 1221  GLUCAP 138* 139* 181* 145* 191*     Recent Results (from the past 240 hour(s))  Resp Panel by RT-PCR (Flu A&B, Covid) Nasopharyngeal Swab     Status: None   Collection Time: 07/06/21  6:36  PM   Specimen: Nasopharyngeal Swab; Nasopharyngeal(NP) swabs in vial transport medium  Result Value Ref Range Status   SARS Coronavirus 2 by RT PCR NEGATIVE NEGATIVE Final    Comment: (NOTE) SARS-CoV-2 target nucleic acids are NOT DETECTED.  The SARS-CoV-2 RNA is generally detectable in upper respiratory specimens during the acute phase of infection. The lowest concentration of SARS-CoV-2 viral copies this assay can detect is 138 copies/mL. A negative result does not preclude SARS-Cov-2 infection and should not be used as the sole basis for treatment or other patient management decisions. A negative result may occur with  improper specimen collection/handling, submission of specimen other than nasopharyngeal swab, presence of viral mutation(s) within the areas targeted by this assay, and inadequate number of viral copies(<138 copies/mL). A negative result must be combined with clinical observations, patient history, and epidemiological information. The expected result is Negative.  Fact Sheet for Patients:  07/08/21  Fact Sheet for Healthcare Providers:  BloggerCourse.com  This test is no t yet approved or cleared by the [employer] FDA and  has been authorized for detection and/or diagnosis of SARS-CoV-2 by FDA under an Emergency Use Authorization (EUA). This EUA will remain  in effect (meaning this test can be used) for the duration of the COVID-19 declaration under Section 564(b)(1) of the Act, 21 U.S.C.section 360bbb-3(b)(1), unless the authorization is terminated  or revoked sooner.       Influenza A by PCR NEGATIVE NEGATIVE Final   Influenza B by PCR NEGATIVE NEGATIVE Final    Comment: (NOTE) The Xpert Xpress SARS-CoV-2/FLU/RSV plus assay is intended as an aid in the diagnosis of influenza from Nasopharyngeal swab specimens and should not be used as a sole basis for treatment. Nasal washings and aspirates are  unacceptable for Xpert Xpress SARS-CoV-2/FLU/RSV testing.  Fact Sheet for Patients: [place]  Fact Sheet for Healthcare Providers: BloggerCourse.com  This test is not yet approved or cleared by the [employer] FDA and has been authorized for detection and/or diagnosis of SARS-CoV-2 by FDA under an Emergency Use Authorization (EUA). This EUA will remain in effect (meaning this test can be used) for the duration of the COVID-19 declaration under Section 564(b)(1) of the Act, 21 U.S.C. section 360bbb-3(b)(1), unless the authorization is terminated or revoked.  Performed at [facility], [address], [place], [place]   MRSA Next Gen by PCR, Nasal     Status: Abnormal   Collection Time: 07/08/21  6:35 PM   Specimen: Nasal Mucosa; Nasal Swab  Result Value Ref Range Status   MRSA by PCR Next Gen DETECTED (A) NOT DETECTED Final    Comment: RESULT CALLED TO, READ BACK BY AND VERIFIED WITH: PHYL,RN@2122  07/08/21 MK (NOTE)  The GeneXpert MRSA Assay (FDA approved for NASAL specimens only), is one component of a comprehensive MRSA colonization surveillance program. It is not intended to diagnose MRSA infection nor to guide or monitor treatment for MRSA infections. Test performance is not FDA approved in patients less than 61 years old. Performed at [facility], [address], [place], [place] [address]          Radiology Studies: MR BRAIN W WO CONTRAST  Result Date: 07/07/2021 CLINICAL DATA:  Multiple sclerosis. Bilateral leg numbness and worsening weakness with a fall. EXAM: MRI HEAD WITHOUT AND WITH CONTRAST TECHNIQUE: Multiplanar, multiecho pulse sequences of the brain and surrounding structures were obtained without and with intravenous contrast. CONTRAST:  21mL GADAVIST GADOBUTROL 1 MMOL/ML IV SOLN COMPARISON:  Head CT 07/06/2021 and head MRI 02/18/2021 FINDINGS: Brain: There is no  evidence of an acute infarct, intracranial hemorrhage, mass, midline shift, or extra-axial fluid collection. The ventricles and sulci are normal. Scattered small T2 hyperintensities in the juxtacortical and periventricular white matter bilaterally are unchanged. No definite lesions are identified in the posterior fossa. No abnormal enhancement identified. Vascular: Major intracranial vascular flow voids are preserved. Skull and upper cervical spine: Unremarkable bone marrow signal. Sinuses/Orbits: Unremarkable orbits. Paranasal sinuses and mastoid air cells are clear. Other: None. IMPRESSION: Unchanged cerebral white matter disease consistent with the history of multiple sclerosis. No evidence of active demyelination or other acute intracranial abnormality. Electronically Signed   By: Sebastian Ache M.D.   On: 07/07/2021 18:12   MR CERVICAL SPINE W WO CONTRAST  Result Date: 07/07/2021 CLINICAL DATA:  Multiple sclerosis. Bilateral leg numbness and worsening weakness with a fall. EXAM: MRI CERVICAL SPINE WITHOUT AND WITH CONTRAST TECHNIQUE: Multiplanar and multiecho pulse sequences of the cervical spine, to include the craniocervical junction and cervicothoracic junction, were obtained without and with intravenous contrast. CONTRAST:  8mL GADAVIST GADOBUTROL 1 MMOL/ML IV SOLN COMPARISON:  Cervical spine MRI 02/18/2021 FINDINGS: Alignment: Straightening of the normal cervical lordosis. Chronic trace anterolisthesis of C4 on C5. Vertebrae: No fracture or suspicious marrow lesion. Degenerative endplate changes and moderate disc space narrowing at C5-6 and C6-7. Cord: Assessment of the cord is limited by motion artifact, predominantly on axial sequences. Faint T2 hyperintensity in the right greater than left spinal cord at C2-3 and suspected hazy T2 hyperintensity in the cord bilaterally at C3-4 have not convincingly changed, and no definite new cord lesion is identified. No abnormal intradural enhancement is evident.  Posterior Fossa, vertebral arteries, paraspinal tissues: Posterior fossa more fully evaluated on today's separate head MRI. Preserved vertebral artery flow voids. Partially visualized endotracheal tube. Disc levels: C2-3: Mild disc bulging, uncovertebral spurring, and mild facet arthrosis without significant stenosis, unchanged. C3-4: Disc bulging, uncovertebral spurring, and mild facet arthrosis result in borderline to mild bilateral neural foraminal stenosis without spinal stenosis, unchanged. C4-5: Anterolisthesis with bulging uncovered disc, right greater than left uncovertebral spurring, and moderate left facet arthrosis result in mild spinal stenosis and mild right neural foraminal stenosis, unchanged. C5-6: A broad-based posterior disc osteophyte complex, small central disc protrusion, and mild facet arthrosis result in mild spinal stenosis and moderate right and severe left neural foraminal stenosis, unchanged. C6-7: Broad-based posterior disc osteophyte complex eccentric to the right and mild facet arthrosis result in mild spinal stenosis and moderate to severe right and mild left neural foraminal stenosis, unchanged. C7-T1: Moderate left greater than right facet arthrosis without significant stenosis, unchanged. IMPRESSION: 1. Faint chronic signal abnormality in the upper cervical  spinal cord consistent with the history of multiple sclerosis. No convincing new lesions or evidence of active demyelination. 2. Unchanged cervical disc and facet degeneration resulting in mild spinal stenosis from C4-5 to C6-7. 3. Moderate to severe multilevel neural foraminal stenosis as above. Electronically Signed   By: Sebastian Ache M.D.   On: 07/07/2021 18:10   MR THORACIC SPINE W WO CONTRAST  Result Date: 07/07/2021 CLINICAL DATA:  Multiple sclerosis. Bilateral leg numbness and worsening weakness with a fall. EXAM: MRI THORACIC WITHOUT AND WITH CONTRAST TECHNIQUE: Multiplanar and multiecho pulse sequences of the  thoracic spine were obtained without and with intravenous contrast. CONTRAST:  47mL GADAVIST GADOBUTROL 1 MMOL/ML IV SOLN COMPARISON:  Thoracic spine MRI 03/02/2012 FINDINGS: Alignment:  Normal. Vertebrae: No fracture, suspicious marrow lesion, or significant marrow edema. Cord: T2 hyperintensity in volume loss in the spinal cord at T6 are unchanged from the prior MRI. No definite other thoracic spinal cord lesions are identified although assessment is limited by motion artifact on axial sequences. No abnormal enhancement is identified. Paraspinal and other soft tissues: Unremarkable. Disc levels: Mild disc bulging at T1-2 and in the lower thoracic and included upper lumbar spine without significant stenosis. IMPRESSION: Unchanged spinal cord lesion at T6 with volume loss consistent with chronic multiple sclerosis. No definite new thoracic cord lesions or evidence of active demyelination. Electronically Signed   By: Sebastian Ache M.D.   On: 07/07/2021 18:34        Scheduled Meds:  amantadine  100 mg Oral BID   amitriptyline  25 mg Oral QHS   amLODipine  5 mg Oral Daily   atorvastatin  80 mg Oral QPM   bacitracin   Topical BID   Chlorhexidine Gluconate Cloth  6 each Topical Q0600   dalfampridine  10 mg Oral BID   Dimethyl Fumarate  240 mg Oral BID   DULoxetine  60 mg Oral BID   Eluxadoline  100 mg Oral BID   gabapentin  800 mg Oral BID   insulin aspart  0-9 Units Subcutaneous Q4H   [START ON 07/10/2021] insulin glargine  40 Units Subcutaneous Daily   lamoTRIgine  200 mg Oral BID   losartan  100 mg Oral QHS   magnesium oxide  400 mg Oral Daily   mupirocin ointment  1 application Nasal BID   pantoprazole  40 mg Oral BID   sodium chloride flush  3 mL Intravenous Q12H   Continuous Infusions:  sodium chloride     methylPREDNISolone (SOLU-MEDROL) injection Stopped (07/08/21 2042)     LOS: 0 days        Kathlen Mody, MD [facility]   To contact the attending provider between  7A-7P or the covering provider during after hours 7P-7A, please log into the web site [email or web address] and access using universal [place] password for that web site. If you do not have the password, please call the hospital operator.  07/09/2021, 2:49 PM

## 2021-07-09 NOTE — Progress Notes (Signed)
Physical Therapy Treatment Patient Details Name: [name] MRN: [id number] DOB: 10/06/1960 Today's Date: 07/09/2021   History of Present Illness [name] is a 61 yo female who presented with generalized weakness, bilateral leg numbness tingling and falls over last couple days. Pending MRI work up to confirm MS flare. Medical history significant of  MS, HTN , HLD    PT Comments    Pt began gait training today however became hot with onset of headache and dizziness. Pts BP 171/99 initially then decreased to 147/85. SpO2 at 84% initially on RA s/p amb, increased to 94% with deep breaths. Pt given cold wash clothe for head, pt reports relief and feeling better. Pt remains unsafe to return home alone, continue to recommend AIR upon d/c to achieve safe mod I level of function. Acute PT to cont to follow.    Recommendations for follow up therapy are one component of a multi-disciplinary discharge planning process, led by the attending physician.  Recommendations may be updated based on patient status, additional functional criteria and insurance authorization.  Follow Up Recommendations  Acute inpatient rehab (3hours/day)     Assistance Recommended at Discharge Frequent or constant Supervision/Assistance  Patient can return home with the following A lot of help with walking and/or transfers;A little help with bathing/dressing/bathroom;Assistance with cooking/housework;Assist for transportation;Help with stairs or ramp for entrance   Equipment Recommendations       Recommendations for Other Services Rehab consult     Precautions / Restrictions Precautions Precautions: Fall Restrictions Weight Bearing Restrictions: No     Mobility  Bed Mobility Overal bed mobility: Needs Assistance Bed Mobility: Rolling, Sidelying to Sit Rolling: Mod assist Sidelying to sit: Mod assist       General bed mobility comments: verbal cues for technique, HOB elevated, modA due to body habitus, pt  with good push up from L UE into upright, increased time    Transfers Overall transfer level: Needs assistance Equipment used: Rolling walker (2 wheels) Transfers: Sit to/from Stand Sit to Stand: Min assist           General transfer comment: verbal cues for safe hand placement    Ambulation/Gait Ambulation/Gait assistance: Min assist, +2 safety/equipment Gait Distance (Feet): 10 Feet (x1, 5x1) Assistive device: Rolling walker (2 wheels) Gait Pattern/deviations: Step-to pattern, Decreased stride length, Wide base of support Gait velocity: slow Gait velocity interpretation: <1.31 ft/sec, indicative of household ambulator   General Gait Details: pt mildly shaky, very dependent on bilat UEs, noted quick onset of fatigue. during 2nd attempt to amb pt became lightheaded, headache onset, and became hot requiring pt to sit, SpO2 at 84% on RA, instructions given to take deep breaths, SPO2 back up to 94% within a minute   Stairs             Wheelchair Mobility    Modified Rankin (Stroke Patients Only)       Balance Overall balance assessment: Needs assistance Sitting-balance support: Bilateral upper extremity supported, Feet unsupported Sitting balance-Leahy Scale: Fair     Standing balance support: Bilateral upper extremity supported Standing balance-Leahy Scale: Poor Standing balance comment: dependent on RW                            Cognition Arousal/Alertness: Awake/alert Behavior During Therapy: WFL for tasks assessed/performed Overall Cognitive Status: Within Functional Limits for tasks assessed  Exercises      General Comments General comments (skin integrity, edema, etc.): SpO2 at 84% on RA during amb, back up to 94% on RA with deep breaths, BP 171/99 initially after amb then dropped to 147/85      Pertinent Vitals/Pain Pain Assessment Pain Assessment: 0-10 Pain Score: 8  Pain  Location: bilat feet, then had an onset of headache s/p amb Pain Descriptors / Indicators: Burning Pain Intervention(s): Monitored during session    Home Living                          Prior Function            PT Goals (current goals can now be found in the care plan section) Progress towards PT goals: Progressing toward goals    Frequency    Min 3X/week      PT Plan Current plan remains appropriate    Co-evaluation              AM-PAC PT "6 Clicks" Mobility   Outcome Measure  Help needed turning from your back to your side while in a flat bed without using bedrails?: A Lot Help needed moving from lying on your back to sitting on the side of a flat bed without using bedrails?: A Lot Help needed moving to and from a bed to a chair (including a wheelchair)?: A Little Help needed standing up from a chair using your arms (e.g., wheelchair or bedside chair)?: A Little Help needed to walk in hospital room?: A Lot Help needed climbing 3-5 steps with a railing? : A Lot 6 Click Score: 14    End of Session Equipment Utilized During Treatment: Gait belt Activity Tolerance: Patient limited by pain Patient left: in chair;with call bell/phone within reach;with chair alarm set Nurse Communication: Mobility status (pt's onset of headache, back of neck pain, and vitals s/p ambulation) PT Visit Diagnosis: Unsteadiness on feet (R26.81);Muscle weakness (generalized) (M62.81);Difficulty in walking, not elsewhere classified (R26.2)     Time: 0277-4128 PT Time Calculation (min) (ACUTE ONLY): 28 min  Charges:  $Gait Training: 8-22 mins $Therapeutic Activity: 8-22 mins                     Lewis Shock, PT, DPT Acute Rehabilitation Services Pager #: [phone] Office #: [phone]    Iona Hansen 07/09/2021, 2:07 PM

## 2021-07-10 DIAGNOSIS — E1142 Type 2 diabetes mellitus with diabetic polyneuropathy: Secondary | ICD-10-CM | POA: Diagnosis not present

## 2021-07-10 DIAGNOSIS — I10 Essential (primary) hypertension: Secondary | ICD-10-CM | POA: Diagnosis not present

## 2021-07-10 DIAGNOSIS — G35 Multiple sclerosis: Secondary | ICD-10-CM | POA: Diagnosis not present

## 2021-07-10 LAB — GLUCOSE, CAPILLARY
Glucose-Capillary: 225 mg/dL — ABNORMAL HIGH (ref 70–99)
Glucose-Capillary: 222 mg/dL — ABNORMAL HIGH (ref 70–99)
Glucose-Capillary: 196 mg/dL — ABNORMAL HIGH (ref 70–99)
Glucose-Capillary: 168 mg/dL — ABNORMAL HIGH (ref 70–99)
Glucose-Capillary: 178 mg/dL — ABNORMAL HIGH (ref 70–99)
Glucose-Capillary: 207 mg/dL — ABNORMAL HIGH (ref 70–99)

## 2021-07-10 MED ORDER — MONTELUKAST SODIUM 10 MG PO TABS
10.0000 mg | ORAL_TABLET | Freq: Every day | ORAL | Status: DC
  Administered 2021-07-10 – 2021-07-15 (×6): 10 mg via ORAL
  Filled 2021-07-10 (×6): qty 1

## 2021-07-10 MED ORDER — INSULIN ASPART 100 UNIT/ML IJ SOLN
0.0000 [IU] | Freq: Every day | INTRAMUSCULAR | Status: DC
  Administered 2021-07-10: 2 [IU] via SUBCUTANEOUS
  Administered 2021-07-12: 3 [IU] via SUBCUTANEOUS

## 2021-07-10 MED ORDER — INSULIN ASPART 100 UNIT/ML IJ SOLN: 3.0000 [IU] | Freq: Three times a day (TID) | INTRAMUSCULAR | Status: DC

## 2021-07-10 MED ORDER — INSULIN ASPART 100 UNIT/ML IJ SOLN
0.0000 [IU] | Freq: Three times a day (TID) | INTRAMUSCULAR | Status: DC
  Administered 2021-07-10: 4 [IU] via SUBCUTANEOUS
  Administered 2021-07-11: 7 [IU] via SUBCUTANEOUS
  Administered 2021-07-11: 4 [IU] via SUBCUTANEOUS
  Administered 2021-07-12: 7 [IU] via SUBCUTANEOUS
  Administered 2021-07-12: 4 [IU] via SUBCUTANEOUS
  Administered 2021-07-12: 7 [IU] via SUBCUTANEOUS
  Administered 2021-07-13: 4 [IU] via SUBCUTANEOUS
  Administered 2021-07-13: 7 [IU] via SUBCUTANEOUS
  Administered 2021-07-13 – 2021-07-14 (×3): 4 [IU] via SUBCUTANEOUS
  Administered 2021-07-15 – 2021-07-16 (×4): 3 [IU] via SUBCUTANEOUS

## 2021-07-10 MED ORDER — FLUTICASONE PROPIONATE 50 MCG/ACT NA SUSP
1.0000 | Freq: Two times a day (BID) | NASAL | Status: DC
  Administered 2021-07-11 – 2021-07-16 (×11): 1 via NASAL
  Filled 2021-07-10: qty 16

## 2021-07-10 MED ORDER — FLUTICASONE PROPIONATE 50 MCG/ACT NA SUSP
2.0000 | Freq: Every day | NASAL | Status: DC
  Administered 2021-07-10: 2 via NASAL
  Filled 2021-07-10: qty 16

## 2021-07-10 MED ORDER — LIFITEGRAST 5 % OP SOLN
1.0000 [drp] | Freq: Two times a day (BID) | OPHTHALMIC | Status: DC
  Administered 2021-07-11 – 2021-07-16 (×11): 1 [drp] via OPHTHALMIC

## 2021-07-10 MED ORDER — OXCARBAZEPINE 300 MG PO TABS
300.0000 mg | ORAL_TABLET | Freq: Two times a day (BID) | ORAL | Status: DC
Start: 2021-07-10 — End: 2021-07-16
  Administered 2021-07-10 – 2021-07-16 (×13): 300 mg via ORAL
  Filled 2021-07-10 (×13): qty 1

## 2021-07-10 NOTE — Progress Notes (Signed)
PROGRESS NOTE    [name]  OMV:[id number] DOB: 02-03-1961 DOA: 07/06/2021 PCP: Felix Pacini, FNP    Chief Complaint  Patient presents with   Weakness   Fall   Foot Injury    Brief Narrative:   [phone] with a history of multiple sclerosis, HTN, DM2, obesity, and HLD who presented to the ED after a fall and further reporting numbness of the legs bilaterally for 4 days with associated progressive weakness and numbness.  CT head in the ED revealed no acute findings.  Neurology was contacted and recommended an MRI,. No active demyelination on the MRI'S.   Assessment & Plan:   Principal Problem:   Multiple sclerosis (HCC) Active Problems:   DM (diabetes mellitus), type 2 with complications (HCC)   Obesity, Class III, BMI 40-49.9 (morbid obesity) (HCC)   Essential hypertension   Diabetic peripheral neuropathy (HCC)   Fluid overload   Chronic pain   Bilateral lower extremity numbness with progressive weakness MRI of the brain, cervical and thoracic spine without evidence to suggest acute demyelination. Though imaging does not show any active demyelination, her clinical picture revealing sensory deficits and neurology recommended starting her on IV steroids for a total of 5 days.  Therapy evaluations ordered.  Started her on gabapentin for the peripheral neuropathy .     Hypertension:  BP parameters are  sub optimal.  Continue with Norvasc   Type 2 diabetes mellitus Insulin-dependent Restarted home medications. Uncontrolled with hyperglycemia. Continue with sliding scale insulin. CBG (last 3)  Recent Labs    07/10/21 0336 07/10/21 0807 07/10/21 1204  GLUCAP 225* 222* 196*     Hyperlipidemia Continue with Lipitor   DVT prophylaxis: (Lovenox/) Code Status: (full code) Family Communication: none at bedside.  Disposition:   Status is: Inpatient  Remains inpatient appropriate because: IV steroids.      Consultants:  Neurology.   Procedures:  none.   Antimicrobials: none.     Subjective: Improving paraesthesias.   Objective: Vitals:   07/10/21 0000 07/10/21 0336 07/10/21 0713 07/10/21 1111  BP:  (!) 143/84 (!) 147/93 (!) 157/86  Pulse:  97 94 (!) 101  Resp:  18 18 18   Temp:  (!) 97.5 F (36.4 C) 97.7 F (36.5 C) 98.2 F (36.8 C)  TempSrc:  Oral Oral Oral  SpO2: 93% 93% 96% 96%    Intake/Output Summary (Last 24 hours) at 07/10/2021 1411 Last data filed at 07/10/2021 1111 Gross per 24 hour  Intake --  Output 300 ml  Net -300 ml    There were no vitals filed for this visit.  Examination: General exam: Appears calm and comfortable  Respiratory system: Clear to auscultation. Respiratory effort normal. Cardiovascular system: S1 & S2 heard, RRR. No JVD,  No pedal edema. Gastrointestinal system: Abdomen is nondistended, soft and nontender. . Normal bowel sounds heard. Central nervous system: Alert and oriented. No focal neurological deficits. Extremities: Symmetric 5 x 5 power. Skin: No rashes, lesions or ulcers Psychiatry: Mood & affect appropriate.      Data Reviewed: I have personally reviewed following labs and imaging studies  CBC: Recent Labs  Lab 07/06/21 1343 07/06/21 2201 07/07/21 0252  WBC 7.3 7.8 8.2  NEUTROABS 5.3 4.5 4.2  HGB 12.6 13.5 12.8  HCT 37.5 40.0 37.3  MCV 90.6 91.5 89.9  PLT 236 251 253     Basic Metabolic Panel: Recent Labs  Lab 07/06/21 1343 07/06/21 2201 07/07/21 0252 07/08/21 0158  NA 140  --  140  140  K 3.4*  --  3.0* 4.1  CL 107  --  104 105  CO2 27  --  27 27  GLUCOSE 104*  --  104* 94  BUN 20  --  11 11  CREATININE 0.97  --  0.79 0.97  CALCIUM 9.3  --  9.4 9.4  MG  --  2.2 2.2 2.3  PHOS  --  3.5 2.8  --      GFR: CrCl cannot be calculated (Unknown ideal weight.).  Liver Function Tests: Recent Labs  Lab 07/06/21 1343 07/06/21 2201 07/07/21 0252  AST 29 32 29  ALT 24 25 23   ALKPHOS 57 64 64  BILITOT 0.2* 0.5 0.2*  PROT 6.9 7.2 6.5  ALBUMIN  4.0 4.0 3.6     CBG: Recent Labs  Lab 07/09/21 1946 07/09/21 2327 07/10/21 0336 07/10/21 0807 07/10/21 1204  GLUCAP 139* 195* 225* 222* 196*      Recent Results (from the past 240 hour(s))  Resp Panel by RT-PCR (Flu A&B, Covid) Nasopharyngeal Swab     Status: None   Collection Time: 07/06/21  6:36 PM   Specimen: Nasopharyngeal Swab; Nasopharyngeal(NP) swabs in vial transport medium  Result Value Ref Range Status   SARS Coronavirus 2 by RT PCR NEGATIVE NEGATIVE Final    Comment: (NOTE) SARS-CoV-2 target nucleic acids are NOT DETECTED.  The SARS-CoV-2 RNA is generally detectable in upper respiratory specimens during the acute phase of infection. The lowest concentration of SARS-CoV-2 viral copies this assay can detect is 138 copies/mL. A negative result does not preclude SARS-Cov-2 infection and should not be used as the sole basis for treatment or other patient management decisions. A negative result may occur with  improper specimen collection/handling, submission of specimen other than nasopharyngeal swab, presence of viral mutation(s) within the areas targeted by this assay, and inadequate number of viral copies(<138 copies/mL). A negative result must be combined with clinical observations, patient history, and epidemiological information. The expected result is Negative.  Fact Sheet for Patients:  07/08/21  Fact Sheet for Healthcare Providers:  BloggerCourse.com  This test is no t yet approved or cleared by the [employer]iousBro[employer].[place] FDA and  has been authorized for detection and/or diagnosis of SARS-CoV-2 by FDA under an Emergency Use Authorization (EUA). This EUA will remain  in effect (meaning this test can be used) for the duration of the COVID-19 declaration under Section 564(b)(1) of the Act, 21 U.S.C.section 360bbb-3(b)(1), unless the authorization is terminated  or revoked sooner.       Influenza A by  PCR NEGATIVE NEGATIVE Final   Influenza B by PCR NEGATIVE NEGATIVE Final    Comment: (NOTE) The Xpert Xpress SARS-CoV-2/FLU/RSV plus assay is intended as an aid in the diagnosis of influenza from Nasopharyngeal swab specimens and should not be used as a sole basis for treatment. Nasal washings and aspirates are unacceptable for Xpert Xpress SARS-CoV-2/FLU/RSV testing.  Fact Sheet for Patients: [place]  Fact Sheet for Healthcare Providers: BloggerCourse.com  This test is not yet approved or cleared by the Serious[employer]ker.it FDA and has been authorized for detection and/or diagnosis of SARS-CoV-2 by FDA under an Emergency Use Authorization (EUA). This EUA will remain in effect (meaning this test can be used) for the duration of the COVID-19 declaration under Section 564(b)(1) of the Act, 21 U.S.C. section 360bbb-3(b)(1), unless the authorization is terminated or revoked.  Performed at [facility], [address], [place], [place]   MRSA Next Gen  by PCR, Nasal     Status: Abnormal   Collection Time: 07/08/21  6:35 PM   Specimen: Nasal Mucosa; Nasal Swab  Result Value Ref Range Status   MRSA by PCR Next Gen DETECTED (A) NOT DETECTED Final    Comment: RESULT CALLED TO, READ BACK BY AND VERIFIED WITH: PHYL,RN@2122  07/08/21 MK (NOTE) The GeneXpert MRSA Assay (FDA approved for NASAL specimens only), is one component of a comprehensive MRSA colonization surveillance program. It is not intended to diagnose MRSA infection nor to guide or monitor treatment for MRSA infections. Test performance is not FDA approved in patients less than 81 years old. Performed at [facility], [address], [place], [place] [address]           Radiology Studies: No results found.      Scheduled Meds:  amantadine  100 mg Oral BID   amitriptyline  25 mg Oral QHS   amLODipine  5 mg Oral Daily    atorvastatin  80 mg Oral QPM   bacitracin   Topical BID   Chlorhexidine Gluconate Cloth  6 each Topical Q0600   dalfampridine  10 mg Oral BID   Dimethyl Fumarate  240 mg Oral BID   DULoxetine  60 mg Oral BID   Eluxadoline  100 mg Oral BID   fluticasone  2 spray Each Nare Daily   gabapentin  800 mg Oral BID   insulin aspart  0-9 Units Subcutaneous Q4H   insulin glargine  40 Units Subcutaneous Daily   lamoTRIgine  200 mg Oral BID   Lifitegrast  1 drop Both Eyes BID   losartan  100 mg Oral QHS   magnesium oxide  400 mg Oral Daily   montelukast  10 mg Oral QHS   mupirocin ointment  1 application Nasal BID   Oxcarbazepine  300 mg Oral BID   pantoprazole  40 mg Oral BID   sodium chloride flush  3 mL Intravenous Q12H   Continuous Infusions:  sodium chloride     methylPREDNISolone (SOLU-MEDROL) injection 1,000 mg (07/09/21 2050)     LOS: 1 day        Kathlen Mody, MD [facility]   To contact the attending provider between 7A-7P or the covering provider during after hours 7P-7A, please log into the web site [email or web address] and access using universal [place] password for that web site. If you do not have the password, please call the hospital operator.  07/10/2021, 2:11 PM

## 2021-07-10 NOTE — Plan of Care (Signed)

## 2021-07-10 NOTE — Progress Notes (Signed)
PT Cancellation Note  Patient Details Name: SHEMA LEAHY MRN: [id number] DOB: May 14, 1961   Cancelled Treatment:    Reason Eval/Treat Not Completed: Fatigue/lethargy limiting ability to participate. Pt declining, stating "I'm just too tired today. Tomorrow would be better."   Lorriane Shire 07/10/2021, 10:40 AM  Lorrin Goodell, PT  Office # [phone] Pager [phone]

## 2021-07-10 NOTE — Progress Notes (Signed)
Inpatient Rehabilitation Admissions Coordinator   I met with patient at bedside for rehab assessment. We discussed goals and expectations of a possible Cir admit. She states she and her daughter had already began looking into possible SNF placement. I will follow up tomorrow with review of cost of CIR and assess participation further with therapies.  Danne Baxter, RN, MSN Rehab Admissions Coordinator [phone] 07/10/2021 11:29 AM

## 2021-07-11 LAB — GLUCOSE, CAPILLARY
Glucose-Capillary: 213 mg/dL — ABNORMAL HIGH (ref 70–99)
Glucose-Capillary: 162 mg/dL — ABNORMAL HIGH (ref 70–99)
Glucose-Capillary: 85 mg/dL (ref 70–99)
Glucose-Capillary: 192 mg/dL — ABNORMAL HIGH (ref 70–99)

## 2021-07-11 MED ORDER — AMLODIPINE BESYLATE 10 MG PO TABS
10.0000 mg | ORAL_TABLET | Freq: Every day | ORAL | Status: DC
  Administered 2021-07-12 – 2021-07-16 (×5): 10 mg via ORAL
  Filled 2021-07-11 (×5): qty 1

## 2021-07-11 NOTE — Progress Notes (Signed)
PROGRESS NOTE    [name]  JFH:[id number] DOB: 62/02/1961 DOA: 07/06/2021 PCP: Felix Pacini, FNP    Chief Complaint  Patient presents with   Weakness   Fall   Foot Injury    Brief Narrative:   [phone] with a history of multiple sclerosis, HTN, DM2, obesity, and HLD who presented to the ED after a fall and further reporting numbness of the legs bilaterally for 4 days with associated progressive weakness and numbness.  CT head in the ED revealed no acute findings.  Neurology was contacted and recommended an MRI,. No active demyelination on the MRI'S.   Assessment & Plan:   Principal Problem:   Multiple sclerosis (HCC) Active Problems:   DM (diabetes mellitus), type 2 with complications (HCC)   Obesity, Class III, BMI 40-49.9 (morbid obesity) (HCC)   Essential hypertension   Diabetic peripheral neuropathy (HCC)   Fluid overload   Chronic pain   Bilateral lower extremity numbness with progressive weakness MRI of the brain, cervical and thoracic spine without evidence to suggest acute demyelination. Though imaging does not show any active demyelination, her clinical picture revealing sensory deficits and neurology recommended starting her on IV steroids for a total of 5 days. Last day  Therapy evaluations ordered.  Started her on gabapentin for the peripheral neuropathy .     Hypertension:  BP parameters are  slightly elevated. Norvasc increased to 10 mg daily.  Continue with norvsasc, losartan, .   Type 2 diabetes mellitus Insulin-dependent Restarted home medications. Uncontrolled with hyperglycemia. Continue with sliding scale insulin. CBG (last 3)  Recent Labs    07/10/21 1949 07/10/21 2222 07/11/21 0624  GLUCAP 178* 207* 213*   Continue with 40 units of Lantus.    Hyperlipidemia Continue with Lipitor   DVT prophylaxis: (Lovenox/) Code Status: (full code) Family Communication: none at bedside.  Disposition:   Status is: Inpatient  Remains  inpatient appropriate because: IV steroids.      Consultants:  Neurology.   Procedures: none.   Antimicrobials: none.     Subjective: She reports the paraesthesias are improving. No chest pain or sob.   Objective: Vitals:   07/10/21 1948 07/10/21 2346 07/11/21 0344 07/11/21 0841  BP: (!) 167/97 (!) 161/84 (!) 148/81 (!) 153/99  Pulse: (!) 103 97 93 84  Resp:  17 16 15   Temp: 97.9 F (36.6 C) 98.5 F (36.9 C) 98.7 F (37.1 C) 97.6 F (36.4 C)  TempSrc: Oral Oral Oral Oral  SpO2: 96% 97% 96% 96%    Intake/Output Summary (Last 24 hours) at 07/11/2021 0936 Last data filed at 07/11/2021 4287 Gross per 24 hour  Intake 146.11 ml  Output 1400 ml  Net -1253.89 ml    There were no vitals filed for this visit.  Examination: General exam: Appears calm and comfortable  Respiratory system: Clear to auscultation. Respiratory effort normal. Cardiovascular system: S1 & S2 heard, RRR. No JVD,  No pedal edema. Gastrointestinal system: Abdomen is nondistended, soft and nontender. Normal bowel sounds heard. Central nervous system: Alert and oriented. Improving paraesthesias  Extremities: Symmetric 5 x 5 power. Skin: No rashes, lesions or ulcers Psychiatry:  Mood & affect appropriate.       Data Reviewed: I have personally reviewed following labs and imaging studies  CBC: Recent Labs  Lab 07/06/21 1343 07/06/21 2201 07/07/21 0252  WBC 7.3 7.8 8.2  NEUTROABS 5.3 4.5 4.2  HGB 12.6 13.5 12.8  HCT 37.5 40.0 37.3  MCV 90.6 91.5 89.9  PLT 236 251 253     Basic Metabolic Panel: Recent Labs  Lab 07/06/21 1343 07/06/21 2201 07/07/21 0252 07/08/21 0158  NA 140  --  140 140  K 3.4*  --  3.0* 4.1  CL 107  --  104 105  CO2 27  --  27 27  GLUCOSE 104*  --  104* 94  BUN 20  --  11 11  CREATININE 0.97  --  0.79 0.97  CALCIUM 9.3  --  9.4 9.4  MG  --  2.2 2.2 2.3  PHOS  --  3.5 2.8  --      GFR: CrCl cannot be calculated (Unknown ideal weight.).  Liver Function  Tests: Recent Labs  Lab 07/06/21 1343 07/06/21 2201 07/07/21 0252  AST 29 32 29  ALT 24 25 23   ALKPHOS 57 64 64  BILITOT 0.2* 0.5 0.2*  PROT 6.9 7.2 6.5  ALBUMIN 4.0 4.0 3.6     CBG: Recent Labs  Lab 07/10/21 1204 07/10/21 1552 07/10/21 1949 07/10/21 2222 07/11/21 0624  GLUCAP 196* 168* 178* 207* 213*      Recent Results (from the past 240 hour(s))  Resp Panel by RT-PCR (Flu A&B, Covid) Nasopharyngeal Swab     Status: None   Collection Time: 07/06/21  6:36 PM   Specimen: Nasopharyngeal Swab; Nasopharyngeal(NP) swabs in vial transport medium  Result Value Ref Range Status   SARS Coronavirus 2 by RT PCR NEGATIVE NEGATIVE Final    Comment: (NOTE) SARS-CoV-2 target nucleic acids are NOT DETECTED.  The SARS-CoV-2 RNA is generally detectable in upper respiratory specimens during the acute phase of infection. The lowest concentration of SARS-CoV-2 viral copies this assay can detect is 138 copies/mL. A negative result does not preclude SARS-Cov-2 infection and should not be used as the sole basis for treatment or other patient management decisions. A negative result may occur with  improper specimen collection/handling, submission of specimen other than nasopharyngeal swab, presence of viral mutation(s) within the areas targeted by this assay, and inadequate number of viral copies(<138 copies/mL). A negative result must be combined with clinical observations, patient history, and epidemiological information. The expected result is Negative.  Fact Sheet for Patients:  07/08/21  Fact Sheet for Healthcare Providers:  BloggerCourse.com  This test is no t yet approved or cleared by the [employer] FDA and  has been authorized for detection and/or diagnosis of SARS-CoV-2 by FDA under an Emergency Use Authorization (EUA). This EUA will remain  in effect (meaning this test can be used) for the duration of  the COVID-19 declaration under Section 564(b)(1) of the Act, 21 U.S.C.section 360bbb-3(b)(1), unless the authorization is terminated  or revoked sooner.       Influenza A by PCR NEGATIVE NEGATIVE Final   Influenza B by PCR NEGATIVE NEGATIVE Final    Comment: (NOTE) The Xpert Xpress SARS-CoV-2/FLU/RSV plus assay is intended as an aid in the diagnosis of influenza from Nasopharyngeal swab specimens and should not be used as a sole basis for treatment. Nasal washings and aspirates are unacceptable for Xpert Xpress SARS-CoV-2/FLU/RSV testing.  Fact Sheet for Patients: [place]  Fact Sheet for Healthcare Providers: BloggerCourse.com  This test is not yet approved or cleared by the [employer].[place] FDA and has been authorized for detection and/or diagnosis of SARS-CoV-2 by FDA under an Emergency Use Authorization (EUA). This EUA will remain in effect (meaning this test can be used) for the duration of the COVID-19 declaration under Section 564(b)(1) of the Act,  21 U.S.C. section 360bbb-3(b)(1), unless the authorization is terminated or revoked.  Performed at [facility], [address], [place], [place] [address]   MRSA Next Gen by PCR, Nasal     Status: Abnormal   Collection Time: 07/08/21  6:35 PM   Specimen: Nasal Mucosa; Nasal Swab  Result Value Ref Range Status   MRSA by PCR Next Gen DETECTED (A) NOT DETECTED Final    Comment: RESULT CALLED TO, READ BACK BY AND VERIFIED WITH: PHYL,RN@2122  07/08/21 MK (NOTE) The GeneXpert MRSA Assay (FDA approved for NASAL specimens only), is one component of a comprehensive MRSA colonization surveillance program. It is not intended to diagnose MRSA infection nor to guide or monitor treatment for MRSA infections. Test performance is not FDA approved in patients less than 25 years old. Performed at [facility], [address], [place], [place] [address]            Radiology Studies: No results found.      Scheduled Meds:  amantadine  100 mg Oral BID   amitriptyline  25 mg Oral QHS   amLODipine  5 mg Oral Daily   atorvastatin  80 mg Oral QPM   bacitracin   Topical BID   dalfampridine  10 mg Oral BID   Dimethyl Fumarate  240 mg Oral BID   DULoxetine  60 mg Oral BID   Eluxadoline  100 mg Oral BID   fluticasone  1 spray Each Nare BID   gabapentin  800 mg Oral BID   insulin aspart  0-20 Units Subcutaneous TID WC   insulin aspart  0-5 Units Subcutaneous QHS   insulin glargine  40 Units Subcutaneous Daily   lamoTRIgine  200 mg Oral BID   Lifitegrast  1 drop Both Eyes BID   losartan  100 mg Oral QHS   magnesium oxide  400 mg Oral Daily   montelukast  10 mg Oral QHS   mupirocin ointment  1 application Nasal BID   Oxcarbazepine  300 mg Oral BID   pantoprazole  40 mg Oral BID   sodium chloride flush  3 mL Intravenous Q12H   Continuous Infusions:  sodium chloride     methylPREDNISolone (SOLU-MEDROL) injection Stopped (07/10/21 1824)     LOS: 2 days        Kathlen Mody, MD [facility]   To contact the attending provider between 7A-7P or the covering provider during after hours 7P-7A, please log into the web site [email or web address] and access using universal [place] password for that web site. If you do not have the password, please call the hospital operator.  07/11/2021, 9:36 AM

## 2021-07-11 NOTE — Plan of Care (Signed)

## 2021-07-11 NOTE — Plan of Care (Signed)
  Problem: Education: Goal: Knowledge of General Education information will improve Description: Including pain rating scale, medication(s)/side effects and non-pharmacologic comfort measures Outcome: Progressing   Problem: Pain Managment: Goal: General experience of comfort will improve Outcome: Progressing   Problem: Skin Integrity: Goal: Risk for impaired skin integrity will decrease Outcome: Progressing   

## 2021-07-11 NOTE — Progress Notes (Signed)
Physical Therapy Treatment Patient Details Name: [name] MRN: [id number] DOB: 03/08/1961 Today's Date: 07/11/2021   History of Present Illness [name] is a 61 yo female who presented with generalized weakness, bilateral leg numbness tingling and falls over last couple days. Pending MRI work up to confirm MS flare. Medical history significant of  MS, HTN , HLD    PT Comments    Pt has made some improvement toward goals, still limited by weakness, stamina and mild instability.  Emphasis on transitions, sit to stand safety, stand activity (peri care) at EOB with some challenge to balance, progression of gait with stress on rollator safety with min assist and notable for mild degradation as she fatigued.    Recommendations for follow up therapy are one component of a multi-disciplinary discharge planning process, led by the attending physician.  Recommendations may be updated based on patient status, additional functional criteria and insurance authorization.  Follow Up Recommendations  Acute inpatient rehab (3hours/day)     Assistance Recommended at Discharge Frequent or constant Supervision/Assistance  Patient can return home with the following A little help with walking and/or transfers;A little help with bathing/dressing/bathroom;Assistance with cooking/housework;Help with stairs or ramp for entrance   Equipment Recommendations  None recommended by PT    Recommendations for Other Services       Precautions / Restrictions Precautions Precautions: Fall     Mobility  Bed Mobility Overal bed mobility: Needs Assistance Bed Mobility: Supine to Sit   Sidelying to sit: Min assist            Transfers Overall transfer level: Needs assistance Equipment used:  (tray table) Transfers: Sit to/from Stand Sit to Stand: Min guard           General transfer comment: verbal cues for safe hand placement    Ambulation/Gait Ambulation/Gait assistance: Min assist Gait  Distance (Feet): 80 Feet (x 2) Assistive device: Rollator (4 wheels) Gait Pattern/deviations: Step-through pattern Gait velocity: slow Gait velocity interpretation: <1.31 ft/sec, indicative of household ambulator   General Gait Details: mildly unsteady with wide BOS, limping, excess w/shift with stance on the R LE.  Pt becomes noticeably dyspneic at 2+/4 with each trial of gait, needing rest, but also gait degrades mildly with fatigue.   Stairs             Wheelchair Mobility    Modified Rankin (Stroke Patients Only)       Balance Overall balance assessment: Needs assistance Sitting-balance support: No upper extremity supported, Feet supported Sitting balance-Leahy Scale: Good     Standing balance support: Single extremity supported, No upper extremity supported, During functional activity Standing balance-Leahy Scale: Fair Standing balance comment: Stood at EOB to wash up "privates" with 1 UE support generaly, but appears comfortable and safe switching UE assist to complete peri care activity over 1-2 min timeframe.                            Cognition Arousal/Alertness: Awake/alert Behavior During Therapy: WFL for tasks assessed/performed Overall Cognitive Status: Within Functional Limits for tasks assessed                                          Exercises      General Comments        Pertinent Vitals/Pain Pain Assessment Faces Pain Scale: Hurts  little more Pain Location: bilat feet with standing Pain Descriptors / Indicators: Burning Pain Intervention(s): Monitored during session    Home Living                          Prior Function            PT Goals (current goals can now be found in the care plan section) Acute Rehab PT Goals Patient Stated Goal: stop the pain PT Goal Formulation: With patient Time For Goal Achievement: 07/21/21 Potential to Achieve Goals: Good Progress towards PT goals: Progressing  toward goals    Frequency    Min 3X/week      PT Plan Current plan remains appropriate    Co-evaluation              AM-PAC PT "6 Clicks" Mobility   Outcome Measure  Help needed turning from your back to your side while in a flat bed without using bedrails?: A Little Help needed moving from lying on your back to sitting on the side of a flat bed without using bedrails?: A Little Help needed moving to and from a bed to a chair (including a wheelchair)?: A Little Help needed standing up from a chair using your arms (e.g., wheelchair or bedside chair)?: A Little Help needed to walk in hospital room?: A Little Help needed climbing 3-5 steps with a railing? : A Lot 6 Click Score: 17    End of Session   Activity Tolerance: Patient limited by fatigue;Patient tolerated treatment well Patient left: in chair;with call bell/phone within reach;with chair alarm set;with nursing/sitter in room Nurse Communication: Mobility status PT Visit Diagnosis: Unsteadiness on feet (R26.81);Muscle weakness (generalized) (M62.81);Difficulty in walking, not elsewhere classified (R26.2)     Time: [id number] PT Time Calculation (min) (ACUTE ONLY): 23 min  Charges:  $Gait Training: 8-22 mins $Therapeutic Activity: 8-22 mins                     07/11/2021  Cynthia Carne., PT Acute Rehabilitation Services [phone]  (pager) [phone]  (office)   [name] 07/11/2021, 1:14 PM

## 2021-07-11 NOTE — Care Management Important Message (Signed)
Important Message  Patient Details  Name: [name] MRN: [id number] Date of Birth: 1961/03/27   Medicare Important Message Given:  Yes     Dorena Bodo 07/11/2021, 3:35 PM

## 2021-07-11 NOTE — Progress Notes (Signed)
Inpatient Rehabilitation Admissions Coordinator   I met with patient at bedside. We discussed cost of care for CIR vs SNF. She states she and her daughter prefer Cir admit. I await therapy treatment updates to then begin Auth for a possible CIR admit next week.  Danne Baxter, RN, MSN Rehab Admissions Coordinator [phone] 07/11/2021 12:13 PM

## 2021-07-12 LAB — GLUCOSE, CAPILLARY
Glucose-Capillary: 256 mg/dL — ABNORMAL HIGH (ref 70–99)
Glucose-Capillary: 219 mg/dL — ABNORMAL HIGH (ref 70–99)
Glucose-Capillary: 206 mg/dL — ABNORMAL HIGH (ref 70–99)
Glucose-Capillary: 206 mg/dL — ABNORMAL HIGH (ref 70–99)
Glucose-Capillary: 193 mg/dL — ABNORMAL HIGH (ref 70–99)
Glucose-Capillary: 254 mg/dL — ABNORMAL HIGH (ref 70–99)

## 2021-07-12 MED ORDER — AQUAPHOR EX OINT
TOPICAL_OINTMENT | CUTANEOUS | Status: DC | PRN
  Filled 2021-07-12: qty 50

## 2021-07-12 NOTE — Plan of Care (Signed)

## 2021-07-12 NOTE — Progress Notes (Signed)
PROGRESS NOTE    [name]  JXB:[id number] DOB: 03/02/1961 DOA: 07/06/2021 PCP: Felix Pacini, FNP    Chief Complaint  Patient presents with   Weakness   Fall   Foot Injury    Brief Narrative:   [phone] with a history of multiple sclerosis, HTN, DM2, obesity, and HLD who presented to the ED after a fall and further reporting numbness of the legs bilaterally for 4 days with associated progressive weakness and numbness.  CT head in the ED revealed no acute findings.  Neurology was contacted and recommended an MRI,. No active demyelination on the MRI'S.   Assessment & Plan:   Principal Problem:   Multiple sclerosis (HCC) Active Problems:   DM (diabetes mellitus), type 2 with complications (HCC)   Obesity, Class III, BMI 40-49.9 (morbid obesity) (HCC)   Essential hypertension   Diabetic peripheral neuropathy (HCC)   Fluid overload   Chronic pain   Bilateral lower extremity numbness with progressive weakness MRI of the brain, cervical and thoracic spine without evidence to suggest acute demyelination. Though imaging does not show any active demyelination, her clinical picture revealing sensory deficits and neurology recommended starting her on IV steroids for a total of 5 days. Therapy evaluations ordered.  Started her on gabapentin for the peripheral neuropathy .  Patient reports her symptoms are improving. Therapy evaluations recommended CIR and she is medically stable for discharge to CIR.    Hypertension:  BP parameters are  slightly elevated. Norvasc increased to 10 mg daily.  Continue with norvsasc, losartan, .  Blood pressures are better controlled today, continue with Norvasc and losartan. Type 2 diabetes mellitus Insulin-dependent Restarted home medications. Uncontrolled with hyperglycemia. Continue with sliding scale insulin. CBG (last 3)  Recent Labs    07/12/21 0856 07/12/21 1152 07/12/21 1334  GLUCAP 219* 206* 206*   Continue with 40 units of  Lantus.  Increase Lantus to 45 units and continue with sliding scale insulin.   Hyperlipidemia Continue with Lipitor   DVT prophylaxis: (Lovenox/) Code Status: (full code) Family Communication: none at bedside.  Disposition:   Status is: Inpatient  Remains inpatient appropriate because: Unsafe discharge/CIR placement     Consultants:  Neurology.   Procedures: none.   Antimicrobials: none.     Subjective: Patient reports paresthesias are improving  Objective: Vitals:   07/12/21 0334 07/12/21 0740 07/12/21 1124 07/12/21 1549  BP: (!) 138/96 (!) 181/107 (!) 141/87 (!) 146/85  Pulse: 100 92 100 100  Resp: Temp: 97.7 F (36.5 C) (!) 97.5 F (36.4 C) 97.8 F (36.6 C) 98 F (36.7 C)  TempSrc: Oral Oral Oral Oral  SpO2: 93% 96% 95% 95%    Intake/Output Summary (Last 24 hours) at 07/12/2021 1616 Last data filed at 07/12/2021 1452 Gross per 24 hour  Intake 240 ml  Output 2150 ml  Net -1910 ml    There were no vitals filed for this visit.  Examination: General exam: Appears calm and comfortable  Respiratory system: Clear to auscultation. Respiratory effort normal. Cardiovascular system: S1 & S2 heard, RRR. No JVD, No pedal edema. Gastrointestinal system: Abdomen is nondistended, soft and nontender.  Normal bowel sounds heard. Central nervous system: Alert and oriented. No focal neurological deficits. Extremities: Symmetric 5 x 5 power. Skin: No rashes, lesions or ulcers Psychiatry: Judgement and insight appear normal. Mood & affect appropriate.        Data Reviewed: I have personally reviewed following labs and imaging studies  CBC:  Recent Labs  Lab 07/06/21 1343 07/06/21 2201 07/07/21 0252  WBC 7.3 7.8 8.2  NEUTROABS 5.3 4.5 4.2  HGB 12.6 13.5 12.8  HCT 37.5 40.0 37.3  MCV 90.6 91.5 89.9  PLT 236 251 253     Basic Metabolic Panel: Recent Labs  Lab 07/06/21 1343 07/06/21 2201 07/07/21 0252 07/08/21 0158  NA 140  --  140 140   K 3.4*  --  3.0* 4.1  CL 107  --  104 105  CO2 27  --  27 27  GLUCOSE 104*  --  104* 94  BUN 20  --  11 11  CREATININE 0.97  --  0.79 0.97  CALCIUM 9.3  --  9.4 9.4  MG  --  2.2 2.2 2.3  PHOS  --  3.5 2.8  --      GFR: CrCl cannot be calculated (Unknown ideal weight.).  Liver Function Tests: Recent Labs  Lab 07/06/21 1343 07/06/21 2201 07/07/21 0252  AST 29 32 29  ALT 24 25 23   ALKPHOS 57 64 64  BILITOT 0.2* 0.5 0.2*  PROT 6.9 7.2 6.5  ALBUMIN 4.0 4.0 3.6     CBG: Recent Labs  Lab 07/11/21 2131 07/12/21 0606 07/12/21 0856 07/12/21 1152 07/12/21 1334  GLUCAP 192* 256* 219* 206* 206*      Recent Results (from the past 240 hour(s))  Resp Panel by RT-PCR (Flu A&B, Covid) Nasopharyngeal Swab     Status: None   Collection Time: 07/06/21  6:36 PM   Specimen: Nasopharyngeal Swab; Nasopharyngeal(NP) swabs in vial transport medium  Result Value Ref Range Status   SARS Coronavirus 2 by RT PCR NEGATIVE NEGATIVE Final    Comment: (NOTE) SARS-CoV-2 target nucleic acids are NOT DETECTED.  The SARS-CoV-2 RNA is generally detectable in upper respiratory specimens during the acute phase of infection. The lowest concentration of SARS-CoV-2 viral copies this assay can detect is 138 copies/mL. A negative result does not preclude SARS-Cov-2 infection and should not be used as the sole basis for treatment or other patient management decisions. A negative result may occur with  improper specimen collection/handling, submission of specimen other than nasopharyngeal swab, presence of viral mutation(s) within the areas targeted by this assay, and inadequate number of viral copies(<138 copies/mL). A negative result must be combined with clinical observations, patient history, and epidemiological information. The expected result is Negative.  Fact Sheet for Patients:  07/08/21  Fact Sheet for Healthcare Providers:   BloggerCourse.com  This test is no t yet approved or cleared by the [employer] FDA and  has been authorized for detection and/or diagnosis of SARS-CoV-2 by FDA under an Emergency Use Authorization (EUA). This EUA will remain  in effect (meaning this test can be used) for the duration of the COVID-19 declaration under Section 564(b)(1) of the Act, 21 U.S.C.section 360bbb-3(b)(1), unless the authorization is terminated  or revoked sooner.       Influenza A by PCR NEGATIVE NEGATIVE Final   Influenza B by PCR NEGATIVE NEGATIVE Final    Comment: (NOTE) The Xpert Xpress SARS-CoV-2/FLU/RSV plus assay is intended as an aid in the diagnosis of influenza from Nasopharyngeal swab specimens and should not be used as a sole basis for treatment. Nasal washings and aspirates are unacceptable for Xpert Xpress SARS-CoV-2/FLU/RSV testing.  Fact Sheet for Patients: [place]  Fact Sheet for Healthcare Providers: BloggerCourse.com  This test is not yet approved or cleared by the [employer]ious[employer] FDA and has been authorized for detection and/or  diagnosis of SARS-CoV-2 by FDA under an Emergency Use Authorization (EUA). This EUA will remain in effect (meaning this test can be used) for the duration of the COVID-19 declaration under Section 564(b)(1) of the Act, 21 U.S.C. section 360bbb-3(b)(1), unless the authorization is terminated or revoked.  Performed at [facility], [address] [place], [place] [address]   MRSA Next Gen by PCR, Nasal     Status: Abnormal   Collection Time: 07/08/21  6:35 PM   Specimen: Nasal Mucosa; Nasal Swab  Result Value Ref Range Status   MRSA by PCR Next Gen DETECTED (A) NOT DETECTED Final    Comment: RESULT CALLED TO, READ BACK BY AND VERIFIED WITH: PHYL,RN@2122  07/08/21 MK (NOTE) The GeneXpert MRSA Assay (FDA approved for NASAL specimens only), is one  component of a comprehensive MRSA colonization surveillance program. It is not intended to diagnose MRSA infection nor to guide or monitor treatment for MRSA infections. Test performance is not FDA approved in patients less than 12 years old. Performed at [facility], [address], [place], [place] [address]           Radiology Studies: No results found.      Scheduled Meds:  amantadine  100 mg Oral BID   amitriptyline  25 mg Oral QHS   amLODipine  10 mg Oral Daily   atorvastatin  80 mg Oral QPM   bacitracin   Topical BID   dalfampridine  10 mg Oral BID   Dimethyl Fumarate  240 mg Oral BID   DULoxetine  60 mg Oral BID   Eluxadoline  100 mg Oral BID   fluticasone  1 spray Each Nare BID   gabapentin  800 mg Oral BID   insulin aspart  0-20 Units Subcutaneous TID WC   insulin aspart  0-5 Units Subcutaneous QHS   insulin glargine  40 Units Subcutaneous Daily   lamoTRIgine  200 mg Oral BID   Lifitegrast  1 drop Both Eyes BID   losartan  100 mg Oral QHS   magnesium oxide  400 mg Oral Daily   montelukast  10 mg Oral QHS   Oxcarbazepine  300 mg Oral BID   pantoprazole  40 mg Oral BID   sodium chloride flush  3 mL Intravenous Q12H   Continuous Infusions:  sodium chloride     methylPREDNISolone (SOLU-MEDROL) injection 1,000 mg (07/11/21 2130)     LOS: 3 days        Kathlen Mody, MD [facility]   To contact the attending provider between 7A-7P or the covering provider during after hours 7P-7A, please log into the web site [email or web address] and access using universal [place] password for that web site. If you do not have the password, please call the hospital operator.  07/12/2021, 4:16 PM

## 2021-07-12 NOTE — Progress Notes (Signed)
Neurology Progress Note  Subjective: Day 5/5 of IV Solu-Medrol to be completed this evening 2/25.  PT recommends patient be discharged to acute inpatient rehab / possible CIR Patient endorses mild improvement in her strength since initiating Solu-Medrol therapy and states that she has been able to ambulate with PT without incident.  Patient does complain of waking up with lower back pain this morning, however, she notes that her neck pain that was present on arrival has resolved and "is too dull to speak about".   Exam: Vitals:   07/12/21 0334 07/12/21 0740  BP: (!) 138/96 (!) 181/107  Pulse: 100 92  Resp: 16 18  Temp: 97.7 F (36.5 C) (!) 97.5 F (36.4 C)  SpO2: 93% 96%   Gen: Sitting up comfortably in bed, eating breakfast, in no acute distress Resp: non-labored breathing, no respiratory distress on room air  Abd: soft, non-tender, non-distended  Neuro: Mental Status: Awake, alert, and oriented throughout.  Speech is fluent and intact without dysarthria. There are no signs of aphasia or neglect noted.  She follows all commands without difficulty.  Cranial Nerves: PERRL, EOMI, visual fields are full, facial sensation to light touch is intact and symmetric, face is symmetric resting and smiling, hearing is intact to voice, palate and tongue are midline  Motor: Initially, patient has mild left upper extremity weakness on exam compared to the right. She has 5/5 strength throughout on the right and 4/5 on the left, however, with repeat instruction, patient gives more effort on a repeat evaluation and there is no significant difference in strength.  Bilateral lower extremities elevate antigravity without vertical drift with minimal weakness of the LLE. Bilateral ankle dorsiflexion and plantar flexion 4/5.  Subjectively, patient reports ongoing decreased strength on the left upper and lower extremities.  Sensory: Sensation to bilateral lower extremities is reported as baseline but she  states both are numb but the left more than the right lower extremity distal to the knee. She reports decreased sensation to light touch and cool temperature on the left lower extremity.  Sensation to light touch and cold temperature intact and symmetric proximal to the knee in bilateral lower extremities.  Sensation to cold temperature intact and symmetric in bilateral upper extremities with decreased sensation to light touch of the left upper extremity.  DTR: 1+ and symmetric throughout Gait: Deferred  Pertinent Labs: CBC    Component Value Date/Time   WBC 8.2 07/07/2021 0252   RBC 4.15 07/07/2021 0252   HGB 12.8 07/07/2021 0252   HGB 15.4 11/13/2020 1335   HCT 37.3 07/07/2021 0252   HCT 44.5 11/13/2020 1335   PLT 253 07/07/2021 0252   PLT 291 11/13/2020 1335   MCV 89.9 07/07/2021 0252   MCV 87 11/13/2020 1335   MCH 30.8 07/07/2021 0252   MCHC 34.3 07/07/2021 0252   RDW 12.4 07/07/2021 0252   RDW 13.1 11/13/2020 1335   LYMPHSABS 3.3 07/07/2021 0252   LYMPHSABS 2.4 11/13/2020 1335   MONOABS 0.6 07/07/2021 0252   EOSABS 0.1 07/07/2021 0252   EOSABS 0.0 11/13/2020 1335   BASOSABS 0.0 07/07/2021 0252   BASOSABS 0.0 11/13/2020 1335   CMP     Component Value Date/Time   NA 140 07/08/2021 0158   K 4.1 07/08/2021 0158   CL 105 07/08/2021 0158   CO2 27 07/08/2021 0158   GLUCOSE 94 07/08/2021 0158   BUN 11 07/08/2021 0158   CREATININE 0.97 07/08/2021 0158   CREATININE 0.85 02/24/2012 1044   CALCIUM 9.4  07/08/2021 0158   PROT 6.5 07/07/2021 0252   ALBUMIN 3.6 07/07/2021 0252   AST 29 07/07/2021 0252   ALT 23 07/07/2021 0252   ALKPHOS 64 07/07/2021 0252   BILITOT 0.2 (L) 07/07/2021 0252   GFRNONAA >60 07/08/2021 0158   GFRAA >60 11/17/2019 1242   Imaging Reviewed: No new neuro imaging.   Assessment: 61 y.o. female with history of MS on Tecfidera with progressive weakness and frequent falls who presented to the ED on 2/19 for evaluation of multiple falls at home and  progressive weakness with concern for MS exacerbation. Patient was started on 5 days of IV steroids with a completion date of 2/25. - Examination reveals subjective improvement in overall weakness with variable weakness throughout exam, felt to be partially effort limited. She states that her left lower extremity is "a little bit stronger" than it was on presentation. She does have minimal left upper and lower extremity weakness on exam.  Patient does continue to have peripheral neuropathic findings which patient states predate her diagnosis of MS. - MRI spinal and brain imaging was reviewed without evidence of active demyelination. However, clinically the patient's presentation is felt to be most consistent with an MS exacerbation and IV steroids were initiated for 5 days.  - Also a likely contributing factor is progressive functional decline as she has reported frequent falls and weakness over several months per chart review (last outpatient neurology visit with Dr. Epimenio Foot in January 2023). Patient does express that her current presentation is consistent with MS exacerbations from the past and states that IV Solu-Medrol has helped relieve these symptoms in the past. Patient requested Solu-Medrol treatment at presentation for ongoing weakness.   Recommendations: - IV Solu-Medrol to be completed tonight - Continue to monitor CBG and CBC during IV steroid treatment - Continue PT/OT as you are - Patient requesting CIR on discharge, PT recommending acute inpatient rehab. Agree with PT. - Resume Tecfidera and follow up outpatient with neurology in 2-4 weeks  - No further neurology recommendations at this time, please contact with further questions or concerns  Lanae Boast, AGACNP-BC [facility] [phone]  Electronically signed: Dr. Caryl Pina

## 2021-07-13 LAB — GLUCOSE, CAPILLARY
Glucose-Capillary: 214 mg/dL — ABNORMAL HIGH (ref 70–99)
Glucose-Capillary: 192 mg/dL — ABNORMAL HIGH (ref 70–99)
Glucose-Capillary: 189 mg/dL — ABNORMAL HIGH (ref 70–99)
Glucose-Capillary: 137 mg/dL — ABNORMAL HIGH (ref 70–99)

## 2021-07-13 MED ORDER — HYDRALAZINE HCL 25 MG PO TABS
25.0000 mg | ORAL_TABLET | Freq: Three times a day (TID) | ORAL | Status: DC
  Administered 2021-07-13 – 2021-07-15 (×5): 25 mg via ORAL
  Filled 2021-07-13 (×5): qty 1

## 2021-07-13 MED ORDER — INSULIN GLARGINE 100 UNIT/ML ~~LOC~~ SOLN
44.0000 [IU] | Freq: Every day | SUBCUTANEOUS | Status: DC
  Administered 2021-07-14 – 2021-07-16 (×3): 44 [IU] via SUBCUTANEOUS
  Filled 2021-07-13 (×3): qty 0.44

## 2021-07-13 NOTE — Progress Notes (Signed)
PROGRESS NOTE    [name]  [id number] DOB: 1961-05-15 DOA: 07/06/2021 PCP: Beverley Fiedler, FNP    Chief Complaint  Patient presents with   Weakness   Fall   Foot Injury    Brief Narrative:   [phone] with a history of multiple sclerosis, HTN, DM2, obesity, and HLD who presented to the ED after a fall and further reporting numbness of the legs bilaterally for 4 days with associated progressive weakness and numbness.  CT head in the ED revealed no acute findings.  Neurology was contacted and recommended an MRI,. No active demyelination on the MRI'S.   Assessment & Plan:   Principal Problem:   Multiple sclerosis ([place]) Active Problems:   DM (diabetes mellitus), type 2 with complications (HCC)   Obesity, Class III, BMI 40-49.9 (morbid obesity) (HCC)   Essential hypertension   Diabetic peripheral neuropathy (HCC)   Fluid overload   Chronic pain   Bilateral lower extremity numbness with progressive weakness MRI of the brain, cervical and thoracic spine without evidence to suggest acute demyelination. Though imaging does not show any active demyelination, her clinical picture revealing sensory deficits and neurology recommended starting her on IV steroids for a total of 5 days. Pt completed 5 days of IV steroids.  Therapy evaluations ordered.  Started her on gabapentin for the peripheral neuropathy .  Patient reports her symptoms are improving. Therapy evaluations recommended CIR and she is medically stable for discharge to CIR. Her symptoms have improved.    Hypertension:  Not well controlled.  Continue with Norvasc and losartan. Added Hydralazine 25 mg TID.     Type 2 diabetes mellitus Insulin-dependent Restarted home medications. Uncontrolled with hyperglycemia. Continue with sliding scale insulin. CBG (last 3)  Recent Labs    07/13/21 0607 07/13/21 1301 07/13/21 1706  GLUCAP 214* 192* 189*   Continue with 40 units of Lantus.  Increase Lantus to 44  units and continue with sliding scale insulin.   Hyperlipidemia Continue with Lipitor   DVT prophylaxis: (Lovenox/) Code Status: (full code) Family Communication: none at bedside.  Disposition:   Status is: Inpatient  Remains inpatient appropriate because: Unsafe discharge/CIR placement     Consultants:  Neurology.   Procedures: none.   Antimicrobials: none.     Subjective: No new complaints.   Objective: Vitals:   07/13/21 0319 07/13/21 0735 07/13/21 1154 07/13/21 1708  BP: (!) 157/94 (!) 142/82 (!) 151/116 (!) 163/88  Pulse: 89 98 92 (!) 103  Resp: 18 18 18 18   Temp: 98 F (36.7 C) 98 F (36.7 C) 98 F (36.7 C) 98.6 F (37 C)  TempSrc: Oral Oral Oral Oral  SpO2: 99% 93% 97% 93%    Intake/Output Summary (Last 24 hours) at 07/13/2021 1725 Last data filed at 07/13/2021 0805 Gross per 24 hour  Intake 666 ml  Output 950 ml  Net -284 ml    There were no vitals filed for this visit.  Examination: General exam: Appears calm and comfortable  Respiratory system: Clear to auscultation. Respiratory effort normal. Cardiovascular system: S1 & S2 heard, RRR. No JVD,  No pedal edema. Gastrointestinal system: Abdomen is nondistended, soft and nontender. . Normal bowel sounds heard. Central nervous system: Alert and oriented. No focal neurological deficits. Extremities: Symmetric 5 x 5 power. Skin: knee abrasion bandaged.  Psychiatry: Mood & affect appropriate.         Data Reviewed: I have personally reviewed following labs and imaging studies  CBC: Recent Labs  Lab 07/06/21  2201 07/07/21 0252  WBC 7.8 8.2  NEUTROABS 4.5 4.2  HGB 13.5 12.8  HCT 40.0 37.3  MCV 91.5 89.9  PLT 251 253     Basic Metabolic Panel: Recent Labs  Lab 07/06/21 2201 07/07/21 0252 07/08/21 0158  NA  --  140 140  K  --  3.0* 4.1  CL  --  104 105  CO2  --  27 27  GLUCOSE  --  104* 94  BUN  --  11 11  CREATININE  --  0.79 0.97  CALCIUM  --  9.4 9.4  MG 2.2 2.2 2.3   PHOS 3.5 2.8  --      GFR: CrCl cannot be calculated (Unknown ideal weight.).  Liver Function Tests: Recent Labs  Lab 07/06/21 2201 07/07/21 0252  AST 32 29  ALT 25 23  ALKPHOS 64 64  BILITOT 0.5 0.2*  PROT 7.2 6.5  ALBUMIN 4.0 3.6     CBG: Recent Labs  Lab 07/12/21 1634 07/12/21 2108 07/13/21 0607 07/13/21 1301 07/13/21 1706  GLUCAP 193* 254* 214* 192* 189*      Recent Results (from the past 240 hour(s))  Resp Panel by RT-PCR (Flu A&B, Covid) Nasopharyngeal Swab     Status: None   Collection Time: 07/06/21  6:36 PM   Specimen: Nasopharyngeal Swab; Nasopharyngeal(NP) swabs in vial transport medium  Result Value Ref Range Status   SARS Coronavirus 2 by RT PCR NEGATIVE NEGATIVE Final    Comment: (NOTE) SARS-CoV-2 target nucleic acids are NOT DETECTED.  The SARS-CoV-2 RNA is generally detectable in upper respiratory specimens during the acute phase of infection. The lowest concentration of SARS-CoV-2 viral copies this assay can detect is 138 copies/mL. A negative result does not preclude SARS-Cov-2 infection and should not be used as the sole basis for treatment or other patient management decisions. A negative result may occur with  improper specimen collection/handling, submission of specimen other than nasopharyngeal swab, presence of viral mutation(s) within the areas targeted by this assay, and inadequate number of viral copies(<138 copies/mL). A negative result must be combined with clinical observations, patient history, and epidemiological information. The expected result is Negative.  Fact Sheet for Patients:  EntrepreneurPulse.com.au  Fact Sheet for Healthcare Providers:  IncredibleEmployment.be  This test is no t yet approved or cleared by the [place] FDA and  has been authorized for detection and/or diagnosis of SARS-CoV-2 by FDA under an Emergency Use Authorization (EUA). This EUA will remain  in  effect (meaning this test can be used) for the duration of the COVID-19 declaration under Section 564(b)(1) of the Act, 21 U.S.C.section 360bbb-3(b)(1), unless the authorization is terminated  or revoked sooner.       Influenza A by PCR NEGATIVE NEGATIVE Final   Influenza B by PCR NEGATIVE NEGATIVE Final    Comment: (NOTE) The Xpert Xpress SARS-CoV-2/FLU/RSV plus assay is intended as an aid in the diagnosis of influenza from Nasopharyngeal swab specimens and should not be used as a sole basis for treatment. Nasal washings and aspirates are unacceptable for Xpert Xpress SARS-CoV-2/FLU/RSV testing.  Fact Sheet for Patients: EntrepreneurPulse.com.au  Fact Sheet for Healthcare Providers: IncredibleEmployment.be  This test is not yet approved or cleared by the [place] FDA and has been authorized for detection and/or diagnosis of SARS-CoV-2 by FDA under an Emergency Use Authorization (EUA). This EUA will remain in effect (meaning this test can be used) for the duration of the COVID-19 declaration under Section 564(b)(1) of the Act, 21  U.S.C. section 360bbb-3(b)(1), unless the authorization is terminated or revoked.  Performed at [facility], [place] [address] [place], [place] [address]   MRSA Next Gen by PCR, Nasal     Status: Abnormal   Collection Time: 07/08/21  6:35 PM   Specimen: Nasal Mucosa; Nasal Swab  Result Value Ref Range Status   MRSA by PCR Next Gen DETECTED (A) NOT DETECTED Final    Comment: RESULT CALLED TO, READ BACK BY AND VERIFIED WITH: PHYL,RN@2122  07/08/21 [place] (NOTE) The GeneXpert MRSA Assay (FDA approved for NASAL specimens only), is one component of a comprehensive MRSA colonization surveillance program. It is not intended to diagnose MRSA infection nor to guide or monitor treatment for MRSA infections. Test performance is not FDA approved in patients less than 14 years old. Performed at [facility], [place] [address], [place], [place] [address]           Radiology Studies: No results found.      Scheduled Meds:  amantadine  100 mg Oral BID   amitriptyline  25 mg Oral QHS   amLODipine  10 mg Oral Daily   atorvastatin  80 mg Oral QPM   bacitracin   Topical BID   dalfampridine  10 mg Oral BID   Dimethyl Fumarate  240 mg Oral BID   DULoxetine  60 mg Oral BID   Eluxadoline  100 mg Oral BID   fluticasone  1 spray Each Nare BID   gabapentin  800 mg Oral BID   insulin aspart  0-20 Units Subcutaneous TID WC   insulin aspart  0-5 Units Subcutaneous QHS   insulin glargine  40 Units Subcutaneous Daily   lamoTRIgine  200 mg Oral BID   Lifitegrast  1 drop Both Eyes BID   losartan  100 mg Oral QHS   magnesium oxide  400 mg Oral Daily   montelukast  10 mg Oral QHS   Oxcarbazepine  300 mg Oral BID   pantoprazole  40 mg Oral BID   sodium chloride flush  3 mL Intravenous Q12H   Continuous Infusions:  sodium chloride       LOS: 4 days        Hosie Poisson, MD [facility]   To contact the attending provider between 7A-7P or the covering provider during after hours 7P-7A, please log into the web site [email or web address] and access using universal [place] password for that web site. If you do not have the password, please call the hospital operator.  07/13/2021, 5:25 PM

## 2021-07-14 LAB — URINALYSIS, ROUTINE W REFLEX MICROSCOPIC
Bacteria, UA: NONE SEEN
Bilirubin Urine: NEGATIVE
Glucose, UA: NEGATIVE mg/dL
Hgb urine dipstick: NEGATIVE
Ketones, ur: NEGATIVE mg/dL
Leukocytes,Ua: NEGATIVE
Nitrite: NEGATIVE
Protein, ur: NEGATIVE mg/dL
Specific Gravity, Urine: 1.013 (ref 1.005–1.030)
pH: 5 (ref 5.0–8.0)

## 2021-07-14 LAB — GLUCOSE, CAPILLARY
Glucose-Capillary: 113 mg/dL — ABNORMAL HIGH (ref 70–99)
Glucose-Capillary: 163 mg/dL — ABNORMAL HIGH (ref 70–99)
Glucose-Capillary: 166 mg/dL — ABNORMAL HIGH (ref 70–99)
Glucose-Capillary: 121 mg/dL — ABNORMAL HIGH (ref 70–99)

## 2021-07-14 NOTE — Progress Notes (Signed)
Physical Therapy Treatment Patient Details Name: [name] MRN: [id number] DOB: 02/26/1961 Today's Date: 07/14/2021   History of Present Illness [name] is a 61 yo female who presented with generalized weakness, bilateral leg numbness tingling and falls over last couple days. Pending MRI work up to confirm MS flare. Medical history significant of  MS, HTN , HLD    PT Comments    Pt has progressed well toward or surpassed her goals.  Emphasis today on transfer safety, progression of gait stability and safe negotiation of stairs.   Recommendations for follow up therapy are one component of a multi-disciplinary discharge planning process, led by the attending physician.  Recommendations may be updated based on patient status, additional functional criteria and insurance authorization.  Follow Up Recommendations  Skilled nursing-short term rehab (<3 hours/day) (vs hhpt)     Assistance Recommended at Discharge Intermittent Supervision/Assistance  Patient can return home with the following A little help with bathing/dressing/bathroom;Assistance with cooking/housework;Help with stairs or ramp for entrance;Assist for transportation   Equipment Recommendations  None recommended by PT    Recommendations for Other Services       Precautions / Restrictions Precautions Precautions: Fall Restrictions Weight Bearing Restrictions: No     Mobility  Bed Mobility               General bed mobility comments: in chair upon arrival, returned to chair    Transfers Overall transfer level: Needs assistance Equipment used: Rollator (4 wheels) Transfers: Sit to/from Stand Sit to Stand: Supervision                Ambulation/Gait Ambulation/Gait assistance: Min guard, Supervision Gait Distance (Feet): 140 Feet (x2) Assistive device: Rollator (4 wheels) Gait Pattern/deviations: Step-through pattern   Gait velocity interpretation: <1.8 ft/sec, indicate of risk for  recurrent falls   General Gait Details: mildly uncoordinated/weak heel toe that didn't degrade too much with fatigue.  Uses rollator well including brakes.   Stairs Stairs: Yes Stairs assistance: Min guard Stair Management: One rail Right, Step to pattern, Forwards Number of Stairs: 4 General stair comments: fair assimulation of her steps with min guard and rail   Wheelchair Mobility    Modified Rankin (Stroke Patients Only)       Balance Overall balance assessment: Needs assistance   Sitting balance-Leahy Scale: Good       Standing balance-Leahy Scale: Fair                              Cognition Arousal/Alertness: Awake/alert Behavior During Therapy: WFL for tasks assessed/performed Overall Cognitive Status: Within Functional Limits for tasks assessed                                          Exercises      General Comments General comments (skin integrity, edema, etc.): vss      Pertinent Vitals/Pain Pain Assessment Pain Assessment: No/denies pain Pain Intervention(s): Monitored during session    Home Living                          Prior Function            PT Goals (current goals can now be found in the care plan section) Acute Rehab PT Goals PT Goal Formulation: With patient Time For  Goal Achievement: 07/21/21 Potential to Achieve Goals: Good Progress towards PT goals: Progressing toward goals    Frequency    Min 3X/week      PT Plan Current plan remains appropriate    Co-evaluation              AM-PAC PT "6 Clicks" Mobility   Outcome Measure  Help needed turning from your back to your side while in a flat bed without using bedrails?: A Little Help needed moving from lying on your back to sitting on the side of a flat bed without using bedrails?: A Little Help needed moving to and from a bed to a chair (including a wheelchair)?: A Little Help needed standing up from a chair using your  arms (e.g., wheelchair or bedside chair)?: A Little Help needed to walk in hospital room?: A Little Help needed climbing 3-5 steps with a railing? : A Little 6 Click Score: 18    End of Session   Activity Tolerance: Patient tolerated treatment well (mild fatigue) Patient left: in chair;with call bell/phone within reach;with chair alarm set;with nursing/sitter in room Nurse Communication: Mobility status PT Visit Diagnosis: Unsteadiness on feet (R26.81);Muscle weakness (generalized) (M62.81);Difficulty in walking, not elsewhere classified (R26.2)     Time: 9450-3888 PT Time Calculation (min) (ACUTE ONLY): 27 min  Charges:  $Gait Training: 8-22 mins $Therapeutic Activity: 8-22 mins                     07/14/2021  Jacinto Halim., PT Acute Rehabilitation Services [phone]  (pager) [phone]  (office)   Eliseo Gum Eria Lozoya 07/14/2021, 4:50 PM

## 2021-07-14 NOTE — Consult Note (Signed)
WOC Nurse Consult Note: Reason for Consult:Fall with injury to left knee Wound type:trauma Pressure Injury POA: NA Measurement: 2 cm x 1.5 cm x 0.2 cm  Wound DXI:PJAS and moist Drainage (amount, consistency, odor) scant weeping Periwound:intact Dressing procedure/placement/frequency:Xeroform gauze and silicone foam. Change every three days and PRN soilage.  Will not follow at this time.  Please re-consult if needed.  Maple Hudson MSN, RN, FNP-BC CWON Wound, Ostomy, Continence Nurse Pager [phone]

## 2021-07-14 NOTE — Progress Notes (Signed)
Occupational Therapy Treatment Patient Details Name: [name] MRN: [id number] DOB: Oct 17, 1960 Today's Date: 07/14/2021   History of present illness [name] is a 61 yo female who presented with generalized weakness, bilateral leg numbness tingling and falls over last couple days. Pending MRI work up to confirm MS flare. Medical history significant of  MS, HTN , HLD   OT comments  [name] is making great progress. Pt reported that she completed ADLs at the sink and toileting with set up only prior to this session. Pt demonstrated supervision level transfers with rollator, including toilet transfer and mod I for hygiene. Pt required 1 verbal cue for locking the breaks on the rollator for transfers, and she had great carryover of safety throughout. She required 1 sitting rest break after hallway ambulation, HR to 122 and SpO2 99% on RA. Pt's insurance denied CIR, d/c recommendation updated to home with [facility] based on current assist levels and 24/7 support at home.    Recommendations for follow up therapy are one component of a multi-disciplinary discharge planning process, led by the attending physician.  Recommendations may be updated based on patient status, additional functional criteria and insurance authorization.    Follow Up Recommendations  Home health OT (insurance has denied CIR)    Assistance Recommended at Discharge Frequent or constant Supervision/Assistance  Patient can return home with the following  A little help with walking and/or transfers;A little help with bathing/dressing/bathroom;Assistance with cooking/housework;Assist for transportation;Help with stairs or ramp for entrance   Equipment Recommendations  Other (comment)       Precautions / Restrictions Precautions Precautions: Fall Restrictions Weight Bearing Restrictions: No       Mobility Bed Mobility               General bed mobility comments: in chair upon arrival, returned to chair     Transfers Overall transfer level: Needs assistance Equipment used: Rollator (4 wheels) Transfers: Sit to/from Stand Sit to Stand: Supervision           General transfer comment: no assist 1 verbal cue for locking brakes prior to standing     Balance Overall balance assessment: Needs assistance Sitting-balance support: No upper extremity supported, Feet supported Sitting balance-Leahy Scale: Good     Standing balance support: No upper extremity supported, During functional activity Standing balance-Leahy Scale: Fair Standing balance comment: grooms at the sink with no UE support, just limited by poor activity tolerance                           ADL either performed or assessed with clinical judgement   ADL Overall ADL's : Needs assistance/impaired                         Toilet Transfer: Supervision/safety;Rollator (4 wheels);Regular Toilet;Ambulation   Toileting- Clothing Manipulation and Hygiene: Modified independent;Sitting/lateral lean       Functional mobility during ADLs: Supervision/safety;Rollator (4 wheels) General ADL Comments: Pt reports that she did a full wash up at the sink wtih set up only prior to the sessoin. supervision for all mobility and transfers this session, pt is limited by poor activity tolerance with increased WOB noted    Extremity/Trunk Assessment Upper Extremity Assessment Upper Extremity Assessment: Overall WFL for tasks assessed RUE Deficits / Details: Full ROM, globally 4/5, paresthesia with pins & needles tingling RUE Sensation: decreased light touch RUE Coordination: decreased fine motor LUE Deficits /  Details: Full ROM, globally 4-/5, paresthesia with pins & needles tingling LUE Sensation: decreased light touch LUE Coordination: decreased fine motor   Lower Extremity Assessment Lower Extremity Assessment: Defer to PT evaluation        Vision   Vision Assessment?: No apparent visual deficits Additional  Comments: wears glasses   Perception Perception Perception: Not tested   Praxis Praxis Praxis: Not tested    Cognition Arousal/Alertness: Awake/alert Behavior During Therapy: WFL for tasks assessed/performed Overall Cognitive Status: Within Functional Limits for tasks assessed                                 General Comments: cog WFL for all tasks assessed this session              General Comments VSS on RA. HR ro 122 after hallway ambulation, SpO2 97-100% on RA    Pertinent Vitals/ Pain       Pain Assessment Pain Assessment: No/denies pain Pain Intervention(s): Monitored during session   Frequency  Min 2X/week        Progress Toward Goals  OT Goals(current goals can now be found in the care plan section)  Progress towards OT goals: Progressing toward goals  Acute Rehab OT Goals Patient Stated Goal: get stronger OT Goal Formulation: With patient Time For Goal Achievement: 07/21/21 Potential to Achieve Goals: Good ADL Goals Pt Will Perform Grooming: standing Pt Will Perform Lower Body Dressing: with modified independence;sit to/from stand Pt Will Transfer to Toilet: with modified independence Additional ADL Goal #1: Pt will indep recall at least 3 fall prevention strategies to apply to the home setting  Plan Discharge plan needs to be updated       AM-PAC OT "6 Clicks" Daily Activity     Outcome Measure   Help from another person eating meals?: None Help from another person taking care of personal grooming?: A Little Help from another person toileting, which includes using toliet, bedpan, or urinal?: A Little Help from another person bathing (including washing, rinsing, drying)?: A Little Help from another person to put on and taking off regular upper body clothing?: None Help from another person to put on and taking off regular lower body clothing?: A Little 6 Click Score: 20    End of Session Equipment Utilized During Treatment: Rolling  walker (2 wheels)  OT Visit Diagnosis: Other abnormalities of gait and mobility (R26.89);Unsteadiness on feet (R26.81);Repeated falls (R29.6);Muscle weakness (generalized) (M62.81);History of falling (Z91.81);Pain   Activity Tolerance Patient tolerated treatment well   Patient Left with call bell/phone within reach;in chair   Nurse Communication Mobility status        Time: [id number] OT Time Calculation (min): 19 min  Charges: OT General Charges $OT Visit: 1 Visit OT Treatments $Self Care/Home Management : 8-22 mins   Cassia Fein A Kayci [place] 07/14/2021, 3:51 PM

## 2021-07-14 NOTE — Progress Notes (Signed)
Message sent to oncall provider about hypertension, no prns ordered

## 2021-07-14 NOTE — Progress Notes (Signed)
Inpatient Rehabilitation Admissions Coordinator  ° °Insurance has denied Cir admit after peer to peer with Dr Akula. I met at bedside and she is aware. She wants to discuss with her daughter this afternoon concerning SNF vs home with HH. I will alert acute team and TOC. We will sign off at this time. ° °Barbara Boyette, RN, MSN °Rehab Admissions Coordinator °[phone] °07/14/2021 3:34 PM ° °

## 2021-07-14 NOTE — Progress Notes (Signed)
PROGRESS NOTE    [name]  LPF:[id number] DOB: 04/30/1961 DOA: 07/06/2021 PCP: Felix Pacini, FNP    Chief Complaint  Patient presents with   Weakness   Fall   Foot Injury    Brief Narrative:   [phone] with a history of multiple sclerosis, HTN, DM2, obesity, and HLD who presented to the ED after a fall and further reporting numbness of the legs bilaterally for 4 days with associated progressive weakness and numbness.  CT head in the ED revealed no acute findings.  Neurology was contacted and recommended an MRI,. No active demyelination on the MRI'S.  Patient completed 5 days of IV steroids.  Her paresthesias are improving.  Therapy evaluations recommending CIR. Currently waiting for CIR placement.  Assessment & Plan:   Principal Problem:   Multiple sclerosis (HCC) Active Problems:   DM (diabetes mellitus), type 2 with complications (HCC)   Obesity, Class III, BMI 40-49.9 (morbid obesity) (HCC)   Essential hypertension   Diabetic peripheral neuropathy (HCC)   Fluid overload   Chronic pain   Bilateral lower extremity numbness with progressive weakness MRI of the brain, cervical and thoracic spine without evidence to suggest acute demyelination. Though imaging does not show any active demyelination, her clinical picture revealing sensory deficits and neurology recommended starting her on IV steroids for a total of 5 days. Pt completed 5 days of IV steroids.  Therapy evaluations ordered.  Started her on gabapentin for the peripheral neuropathy .  Patient reports her symptoms are improving. Therapy evaluations recommended CIR and she is medically stable for discharge to CIR. No new complaints at this time.   Hypertension:  Blood pressure parameters have improved Continue with Norvasc and losartan. Added Hydralazine 25 mg TID.      Type 2 diabetes mellitus Insulin-dependent Restarted home medications. Uncontrolled with hyperglycemia. Continue with sliding scale  insulin. CBG (last 3)  CBG (last 3)  Recent Labs    07/13/21 1706 07/13/21 2043 07/14/21 0624  GLUCAP 189* 137* 113*   Currently on Lantus of 44 units, no changes in medications today..   Hyperlipidemia Continue with Lipitor   DVT prophylaxis: (Lovenox/) Code Status: (full code) Family Communication: none at bedside.  Disposition:   Status is: Inpatient  Remains inpatient appropriate because: Unsafe discharge/CIR placement     Consultants:  Neurology.   Procedures: none.   Antimicrobials: none.     Subjective: No new complaints at this time  Objective: Vitals:   07/14/21 0431 07/14/21 0500 07/14/21 0657 07/14/21 0751  BP: (!) 178/106 (!) 157/102 (!) 156/98 (!) 145/97  Pulse: 86 84 91 70  Resp: 20   18  Temp: (!) 97.2 F (36.2 C)   98.5 F (36.9 C)  TempSrc: Oral   Oral  SpO2: 97%  98% 98%    Intake/Output Summary (Last 24 hours) at 07/14/2021 1102 Last data filed at 07/14/2021 0900 Gross per 24 hour  Intake 360 ml  Output 1700 ml  Net -1340 ml    There were no vitals filed for this visit.  Examination: General exam: Appears calm and comfortable  Respiratory system: Clear to auscultation. Respiratory effort normal. Cardiovascular system: S1 & S2 heard, RRR. No JVD,  No pedal edema. Gastrointestinal system: Abdomen is nondistended, soft and nontender.Normal bowel sounds heard. Central nervous system: Alert and oriented. No focal neurological deficits.  Paresthesias are improving Extremities: Symmetric 5 x 5 power. Skin: No rashes, lesions or ulcers Psychiatry: Mood appropriate       Data  Reviewed: I have personally reviewed following labs and imaging studies  CBC: No results for input(s): WBC, NEUTROABS, HGB, HCT, MCV, PLT in the last 168 hours.   Basic Metabolic Panel: Recent Labs  Lab 07/08/21 0158  NA 140  K 4.1  CL 105  CO2 27  GLUCOSE 94  BUN 11  CREATININE 0.97  CALCIUM 9.4  MG 2.3     GFR: CrCl cannot be calculated  (Unknown ideal weight.).  Liver Function Tests: No results for input(s): AST, ALT, ALKPHOS, BILITOT, PROT, ALBUMIN in the last 168 hours.   CBG: Recent Labs  Lab 07/13/21 0607 07/13/21 1301 07/13/21 1706 07/13/21 2043 07/14/21 0624  GLUCAP 214* 192* 189* 137* 113*      Recent Results (from the past 240 hour(s))  Resp Panel by RT-PCR (Flu A&B, Covid) Nasopharyngeal Swab     Status: None   Collection Time: 07/06/21  6:36 PM   Specimen: Nasopharyngeal Swab; Nasopharyngeal(NP) swabs in vial transport medium  Result Value Ref Range Status   SARS Coronavirus 2 by RT PCR NEGATIVE NEGATIVE Final    Comment: (NOTE) SARS-CoV-2 target nucleic acids are NOT DETECTED.  The SARS-CoV-2 RNA is generally detectable in upper respiratory specimens during the acute phase of infection. The lowest concentration of SARS-CoV-2 viral copies this assay can detect is 138 copies/mL. A negative result does not preclude SARS-Cov-2 infection and should not be used as the sole basis for treatment or other patient management decisions. A negative result may occur with  improper specimen collection/handling, submission of specimen other than nasopharyngeal swab, presence of viral mutation(s) within the areas targeted by this assay, and inadequate number of viral copies(<138 copies/mL). A negative result must be combined with clinical observations, patient history, and epidemiological information. The expected result is Negative.  Fact Sheet for Patients:  BloggerCourse.com  Fact Sheet for Healthcare Providers:  SeriousBroker.it  This test is no t yet approved or cleared by the [place] FDA and  has been authorized for detection and/or diagnosis of SARS-CoV-2 by FDA under an Emergency Use Authorization (EUA). This EUA will remain  in effect (meaning this test can be used) for the duration of the COVID-19 declaration under Section 564(b)(1) of the  Act, 21 U.S.C.section 360bbb-3(b)(1), unless the authorization is terminated  or revoked sooner.       Influenza A by PCR NEGATIVE NEGATIVE Final   Influenza B by PCR NEGATIVE NEGATIVE Final    Comment: (NOTE) The Xpert Xpress SARS-CoV-2/FLU/RSV plus assay is intended as an aid in the diagnosis of influenza from Nasopharyngeal swab specimens and should not be used as a sole basis for treatment. Nasal washings and aspirates are unacceptable for Xpert Xpress SARS-CoV-2/FLU/RSV testing.  Fact Sheet for Patients: BloggerCourse.com  Fact Sheet for Healthcare Providers: SeriousBroker.it  This test is not yet approved or cleared by the [place] FDA and has been authorized for detection and/or diagnosis of SARS-CoV-2 by FDA under an Emergency Use Authorization (EUA). This EUA will remain in effect (meaning this test can be used) for the duration of the COVID-19 declaration under Section 564(b)(1) of the Act, 21 U.S.C. section 360bbb-3(b)(1), unless the authorization is terminated or revoked.  Performed at [facility], [address] [place], [place] [address]   MRSA Next Gen by PCR, Nasal     Status: Abnormal   Collection Time: 07/08/21  6:35 PM   Specimen: Nasal Mucosa; Nasal Swab  Result Value Ref Range Status   MRSA by PCR Next Gen  DETECTED (A) NOT DETECTED Final    Comment: RESULT CALLED TO, READ BACK BY AND VERIFIED WITH: PHYL,RN@2122  07/08/21 MK (NOTE) The GeneXpert MRSA Assay (FDA approved for NASAL specimens only), is one component of a comprehensive MRSA colonization surveillance program. It is not intended to diagnose MRSA infection nor to guide or monitor treatment for MRSA infections. Test performance is not FDA approved in patients less than 88 years old. Performed at [facility], [address], [place], [place] [address]           Radiology Studies: No results  found.      Scheduled Meds:  amantadine  100 mg Oral BID   amitriptyline  25 mg Oral QHS   amLODipine  10 mg Oral Daily   atorvastatin  80 mg Oral QPM   bacitracin   Topical BID   Dimethyl Fumarate  240 mg Oral BID   DULoxetine  60 mg Oral BID   Eluxadoline  100 mg Oral BID   fluticasone  1 spray Each Nare BID   gabapentin  800 mg Oral BID   hydrALAZINE  25 mg Oral Q8H   insulin aspart  0-20 Units Subcutaneous TID WC   insulin aspart  0-5 Units Subcutaneous QHS   insulin glargine  44 Units Subcutaneous Daily   lamoTRIgine  200 mg Oral BID   Lifitegrast  1 drop Both Eyes BID   losartan  100 mg Oral QHS   magnesium oxide  400 mg Oral Daily   montelukast  10 mg Oral QHS   Oxcarbazepine  300 mg Oral BID   pantoprazole  40 mg Oral BID   sodium chloride flush  3 mL Intravenous Q12H   Continuous Infusions:  sodium chloride       LOS: 5 days        Kathlen Mody, MD [facility]   To contact the attending provider between 7A-7P or the covering provider during after hours 7P-7A, please log into the web site [email or web address] and access using universal [address] password for that web site. If you do not have the password, please call the hospital operator.  07/14/2021, 11:02 AM

## 2021-07-15 LAB — GLUCOSE, CAPILLARY
Glucose-Capillary: 103 mg/dL — ABNORMAL HIGH (ref 70–99)
Glucose-Capillary: 136 mg/dL — ABNORMAL HIGH (ref 70–99)
Glucose-Capillary: 133 mg/dL — ABNORMAL HIGH (ref 70–99)
Glucose-Capillary: 122 mg/dL — ABNORMAL HIGH (ref 70–99)

## 2021-07-15 MED ORDER — HYDRALAZINE HCL 50 MG PO TABS
50.0000 mg | ORAL_TABLET | Freq: Three times a day (TID) | ORAL | Status: DC
  Administered 2021-07-15 – 2021-07-16 (×3): 50 mg via ORAL
  Filled 2021-07-15 (×3): qty 1

## 2021-07-15 MED ORDER — MENTHOL 3 MG MT LOZG
1.0000 | LOZENGE | OROMUCOSAL | Status: DC | PRN
  Filled 2021-07-15: qty 9

## 2021-07-15 NOTE — Progress Notes (Signed)
PROGRESS NOTE    [name]  HYH:[id number] DOB: 02/11/1961 DOA: 07/06/2021 PCP: Felix Pacini, FNP    Chief Complaint  Patient presents with   Weakness   Fall   Foot Injury    Brief Narrative:   [phone] with a history of multiple sclerosis, HTN, DM2, obesity, and HLD who presented to the ED after a fall and further reporting numbness of the legs bilaterally for 4 days with associated progressive weakness and numbness.  CT head in the ED revealed no acute findings.  Neurology was contacted and recommended an MRI,. No active demyelination on the MRI'S.  Patient completed 5 days of IV steroids.  Her paresthesias are improving.  Therapy evaluations recommending CIR. Insurance denied CIR. TOC on board for SNF placement.  Pt seen and examined, no new complaints. She would like to go to [facility].   Assessment & Plan:   Principal Problem:   Multiple sclerosis (HCC) Active Problems:   DM (diabetes mellitus), type 2 with complications (HCC)   Obesity, Class III, BMI 40-49.9 (morbid obesity) (HCC)   Essential hypertension   Diabetic peripheral neuropathy (HCC)   Fluid overload   Chronic pain   Bilateral lower extremity numbness with progressive weakness MRI of the brain, cervical and thoracic spine without evidence to suggest acute demyelination. Though imaging does not show any active demyelination, her clinical picture revealing sensory deficits and neurology recommended starting her on IV steroids for a total of 5 days. Pt completed 5 days of IV steroids.  Therapy evaluations ordered.  Started her on gabapentin for the peripheral neuropathy .  Patient reports her symptoms are improving. Therapy evaluations recommended CIR and she is medically stable for discharge to CIR. No new complaints at this time.   Hypertension:  Blood pressure parameters have improved Continue with Norvasc and losartan. Increase hydralazine to 50 mg 3 times daily    Type 2 diabetes  mellitus Insulin-dependent  Restarted home medications. Uncontrolled with hyperglycemia. Continue with sliding scale insulin. CBG (last 3)  CBG (last 3)  Recent Labs    07/14/21 1637 07/14/21 2142 07/15/21 0626  GLUCAP 166* 121* 103*    Currently on Lantus of 44 units, no changes in medications today..   Hyperlipidemia Continue with Lipitor   DVT prophylaxis: (Lovenox/) Code Status: (full code) Family Communication: none at bedside.  Disposition:   Status is: Inpatient  Remains inpatient appropriate because: SNF placement    Consultants:  Neurology.   Procedures: none.   Antimicrobials: none.     Subjective: No new complaints at this time  Objective: Vitals:   07/14/21 2007 07/14/21 2352 07/15/21 0413 07/15/21 0805  BP: (!) 142/80 (!) 158/85 (!) 148/88 (!) 161/79  Pulse: 93 89 89 90  Resp: 20 16 16 17   Temp: 97.8 F (36.6 C) 98.2 F (36.8 C) 97.9 F (36.6 C) 97.9 F (36.6 C)  TempSrc: Oral Oral Oral Oral  SpO2: 98% 98% 98% 96%    Intake/Output Summary (Last 24 hours) at 07/15/2021 1135 Last data filed at 07/15/2021 0805 Gross per 24 hour  Intake 956 ml  Output 1250 ml  Net -294 ml    There were no vitals filed for this visit.  Examination: General exam: Appears calm and comfortable  Respiratory system: Clear to auscultation. Respiratory effort normal. Cardiovascular system: S1 & S2 heard, RRR. No JVD,  No pedal edema. Gastrointestinal system: Abdomen is nondistended, soft and nontender.  Normal bowel sounds heard. Central nervous system: Alert and oriented. No focal  neurological deficits. Extremities: Symmetric 5 x 5 power. Skin: No rashes, lesions or ulcers Psychiatry:  Mood & affect appropriate.         Data Reviewed: I have personally reviewed following labs and imaging studies  CBC: No results for input(s): WBC, NEUTROABS, HGB, HCT, MCV, PLT in the last 168 hours.   Basic Metabolic Panel: No results for input(s): NA, K, CL,  CO2, GLUCOSE, BUN, CREATININE, CALCIUM, MG, PHOS in the last 168 hours.   GFR: CrCl cannot be calculated (Unknown ideal weight.).  Liver Function Tests: No results for input(s): AST, ALT, ALKPHOS, BILITOT, PROT, ALBUMIN in the last 168 hours.   CBG: Recent Labs  Lab 07/14/21 0624 07/14/21 1154 07/14/21 1637 07/14/21 2142 07/15/21 0626  GLUCAP 113* 163* 166* 121* 103*      Recent Results (from the past 240 hour(s))  Resp Panel by RT-PCR (Flu A&B, Covid) Nasopharyngeal Swab     Status: None   Collection Time: 07/06/21  6:36 PM   Specimen: Nasopharyngeal Swab; Nasopharyngeal(NP) swabs in vial transport medium  Result Value Ref Range Status   SARS Coronavirus 2 by RT PCR NEGATIVE NEGATIVE Final    Comment: (NOTE) SARS-CoV-2 target nucleic acids are NOT DETECTED.  The SARS-CoV-2 RNA is generally detectable in upper respiratory specimens during the acute phase of infection. The lowest concentration of SARS-CoV-2 viral copies this assay can detect is 138 copies/mL. A negative result does not preclude SARS-Cov-2 infection and should not be used as the sole basis for treatment or other patient management decisions. A negative result may occur with  improper specimen collection/handling, submission of specimen other than nasopharyngeal swab, presence of viral mutation(s) within the areas targeted by this assay, and inadequate number of viral copies(<138 copies/mL). A negative result must be combined with clinical observations, patient history, and epidemiological information. The expected result is Negative.  Fact Sheet for Patients:  BloggerCourse.com  Fact Sheet for Healthcare Providers:  SeriousBroker.it  This test is no t yet approved or cleared by the [place] FDA and  has been authorized for detection and/or diagnosis of SARS-CoV-2 by FDA under an Emergency Use Authorization (EUA). This EUA will remain  in effect  (meaning this test can be used) for the duration of the COVID-19 declaration under Section 564(b)(1) of the Act, 21 U.S.C.section 360bbb-3(b)(1), unless the authorization is terminated  or revoked sooner.       Influenza A by PCR NEGATIVE NEGATIVE Final   Influenza B by PCR NEGATIVE NEGATIVE Final    Comment: (NOTE) The Xpert Xpress SARS-CoV-2/FLU/RSV plus assay is intended as an aid in the diagnosis of influenza from Nasopharyngeal swab specimens and should not be used as a sole basis for treatment. Nasal washings and aspirates are unacceptable for Xpert Xpress SARS-CoV-2/FLU/RSV testing.  Fact Sheet for Patients: BloggerCourse.com  Fact Sheet for Healthcare Providers: SeriousBroker.it  This test is not yet approved or cleared by the [place] FDA and has been authorized for detection and/or diagnosis of SARS-CoV-2 by FDA under an Emergency Use Authorization (EUA). This EUA will remain in effect (meaning this test can be used) for the duration of the COVID-19 declaration under Section 564(b)(1) of the Act, 21 U.S.C. section 360bbb-3(b)(1), unless the authorization is terminated or revoked.  Performed at [facility], [address], [place], [place] [address]   MRSA Next Gen by PCR, Nasal     Status: Abnormal   Collection Time: 07/08/21  6:35 PM   Specimen: Nasal Mucosa; Nasal Swab  Result Value Ref Range Status   MRSA by PCR Next Gen DETECTED (A) NOT DETECTED Final    Comment: RESULT CALLED TO, READ BACK BY AND VERIFIED WITH: PHYL,RN@2122  07/08/21 MK (NOTE) The GeneXpert MRSA Assay (FDA approved for NASAL specimens only), is one component of a comprehensive MRSA colonization surveillance program. It is not intended to diagnose MRSA infection nor to guide or monitor treatment for MRSA infections. Test performance is not FDA approved in patients less than 66 years old. Performed at [facility], [address] [place], [place] [address]           Radiology Studies: No results found.      Scheduled Meds:  amantadine  100 mg Oral BID   amitriptyline  25 mg Oral QHS   amLODipine  10 mg Oral Daily   atorvastatin  80 mg Oral QPM   bacitracin   Topical BID   Dimethyl Fumarate  240 mg Oral BID   DULoxetine  60 mg Oral BID   Eluxadoline  100 mg Oral BID   fluticasone  1 spray Each Nare BID   gabapentin  800 mg Oral BID   hydrALAZINE  25 mg Oral Q8H   insulin aspart  0-20 Units Subcutaneous TID WC   insulin aspart  0-5 Units Subcutaneous QHS   insulin glargine  44 Units Subcutaneous Daily   lamoTRIgine  200 mg Oral BID   Lifitegrast  1 drop Both Eyes BID   losartan  100 mg Oral QHS   magnesium oxide  400 mg Oral Daily   montelukast  10 mg Oral QHS   Oxcarbazepine  300 mg Oral BID   pantoprazole  40 mg Oral BID   sodium chloride flush  3 mL Intravenous Q12H   Continuous Infusions:  sodium chloride       LOS: 6 days        Kathlen Mody, MD [facility]   To contact the attending provider between 7A-7P or the covering provider during after hours 7P-7A, please log into the web site [email or web address] and access using universal [place] password for that web site. If you do not have the password, please call the hospital operator.  07/15/2021, 11:35 AM

## 2021-07-15 NOTE — TOC Progression Note (Addendum)
Transition of Care [facility]) - Progression Note    Patient Details  Name: [name] MRN: [id number] Date of Birth: 07/24/1960  Transition of [facility] CM/SW Contact  Pollie Friar, RN Phone Number: 07/15/2021, 11:00 AM  Clinical Narrative:    CM met with the patient and she would like to attend [facility] for SNF rehab. CM has sent [facility] a message to see if they are able to offfer a bed.  TOC following. 1231:[facility] has offered a bed. CM has asked the MOA to start insurance auth.  Expected Discharge Plan: [place] Barriers to Discharge: Continued Medical Work up  Expected Discharge Plan and Services Expected Discharge Plan: [place] In-house Referral: Clinical Social Work Discharge Planning Services: CM Consult Post Acute Care Choice: [place] arrangements for the past 2 months: Single Family Home                                       Social Determinants of Health (SDOH) Interventions    Readmission Risk Interventions No flowsheet data found.

## 2021-07-16 DIAGNOSIS — N390 Urinary tract infection, site not specified: Secondary | ICD-10-CM | POA: Diagnosis present

## 2021-07-16 LAB — RESP PANEL BY RT-PCR (FLU A&B, COVID) ARPGX2
Influenza A by PCR: NEGATIVE
Influenza B by PCR: NEGATIVE
SARS Coronavirus 2 by RT PCR: NEGATIVE

## 2021-07-16 LAB — GLUCOSE, CAPILLARY
Glucose-Capillary: 123 mg/dL — ABNORMAL HIGH (ref 70–99)
Glucose-Capillary: 149 mg/dL — ABNORMAL HIGH (ref 70–99)

## 2021-07-16 MED ORDER — ZOLPIDEM TARTRATE 10 MG PO TABS: 10.0000 mg | ORAL_TABLET | Freq: Every day | ORAL | 0 refills | Status: DC

## 2021-07-16 MED ORDER — OXYCODONE HCL 15 MG PO TABS: 15.0000 mg | ORAL_TABLET | Freq: Four times a day (QID) | ORAL | 0 refills | Status: DC | PRN

## 2021-07-16 MED ORDER — AMLODIPINE BESYLATE 10 MG PO TABS: 10.0000 mg | ORAL_TABLET | Freq: Every day | ORAL | Status: DC

## 2021-07-16 MED ORDER — HYDRALAZINE HCL 50 MG PO TABS: 50.0000 mg | ORAL_TABLET | Freq: Three times a day (TID) | ORAL | Status: DC

## 2021-07-16 MED ORDER — CEPHALEXIN 500 MG PO CAPS: 500.0000 mg | ORAL_CAPSULE | Freq: Two times a day (BID) | ORAL | 0 refills | Status: AC

## 2021-07-16 MED ORDER — DIMETHYL FUMARATE 240 MG PO CPDR: 240.0000 mg | DELAYED_RELEASE_CAPSULE | Freq: Two times a day (BID) | ORAL | 3 refills | Status: DC

## 2021-07-16 MED ORDER — CEPHALEXIN 500 MG PO CAPS
500.0000 mg | ORAL_CAPSULE | Freq: Two times a day (BID) | ORAL | Status: DC
  Administered 2021-07-16: 500 mg via ORAL
  Filled 2021-07-16: qty 1

## 2021-07-16 MED ORDER — GABAPENTIN 400 MG PO CAPS: 800.0000 mg | ORAL_CAPSULE | Freq: Two times a day (BID) | ORAL | Status: DC

## 2021-07-16 NOTE — Progress Notes (Signed)
Occupational Therapy Treatment ?Patient Details ?Name: [name] ?MRN: [id number] ?DOB: 1960-05-27 ?Today's Date: 07/16/2021 ? ? ?History of present illness [name] is a 61 yo female who presented with generalized weakness, bilateral leg numbness tingling and falls over last couple days. Pending MRI work up to confirm MS flare. Medical history significant of  MS, HTN , HLD ?  ?OT comments ? Pt continues to make great progress towards her functional goals. Generally she is mod I - supervision for all ADLs at the sink with the exception of requiring mod A for tasks that required to the pt to reach her feet. No rest break required throughout entire ADL session. Reviewed AE (reacher, sock aide) use to incr indep with LB tasks. Pt continues to benefit from OT acutely. D/c recommendation is appropriate however pt has active plans to d/c to SNF. Pt states that she is not able to d/c home because she cannot manage the 2 steps to enter her home despite managing 4 steps with therapy yesterday.  ? ?Recommendations for follow up therapy are one component of a multi-disciplinary discharge planning process, led by the attending physician.  Recommendations may be updated based on patient status, additional functional criteria and insurance authorization. ?   ?Follow Up Recommendations ? Home health OT  ?  ?Assistance Recommended at Discharge Frequent or constant Supervision/Assistance  ?Patient can return home with the following ? A little help with walking and/or transfers;A little help with bathing/dressing/bathroom;Assistance with cooking/housework;Assist for transportation;Help with stairs or ramp for entrance ?  ?Equipment Recommendations ? Other (comment)  ?  ?Recommendations for Other Services Rehab consult ? ?  ?Precautions / Restrictions Precautions ?Precautions: Fall ?Restrictions ?Weight Bearing Restrictions: No  ? ? ?  ? ?Mobility Bed Mobility ?Overal bed mobility: Needs Assistance ?  ?  ?  ?  ?  ?  ?General bed  mobility comments: pt OOB upon arrival ?  ? ?Transfers ?Overall transfer level: Needs assistance ?Equipment used: Rollator (4 wheels) ?Transfers: Sit to/from Stand ?Sit to Stand: Supervision ?  ?  ?  ?  ?  ?General transfer comment: good management of breaks this session ?  ?  ?Balance Overall balance assessment: Needs assistance ?Sitting-balance support: Feet supported ?Sitting balance-Leahy Scale: Good ?  ?  ?Standing balance support: No upper extremity supported, During functional activity ?Standing balance-Leahy Scale: Fair ?  ?  ?  ?  ?  ?  ?  ?  ?  ?  ?  ?  ?   ? ?ADL either performed or assessed with clinical judgement  ? ?ADL Overall ADL's : Needs assistance/impaired ?  ?  ?Grooming: Modified independent;Sitting ?  ?  ?  ?  ?  ?  ?  ?Lower Body Dressing: Moderate assistance;Sit to/from stand ?Lower Body Dressing Details (indicate cue type and reason): assist to thread feet. educated on use of AE to incr indep ?Toilet Transfer: Supervision/safety;Rollator (4 wheels);Regular Toilet;Ambulation ?  ?  ?  ?  ?  ?Functional mobility during ADLs: Supervision/safety;Rollator (4 wheels) ?General ADL Comments: pt is generally mod I for all sitting tasks at the sink, supervision for standing but mod A for all ADLs that required her to reach her feet. Limited by body habitus, weakness and incr in WOB with forward flexion ?  ? ?Extremity/Trunk Assessment Upper Extremity Assessment ?Upper Extremity Assessment: Overall WFL for tasks assessed ?RUE Deficits / Details: pt continues to report some paresthesias, overall WFL ?  ?Lower Extremity Assessment ?Lower Extremity Assessment:  Defer to PT evaluation ?  ?  ?  ? ?Vision   ?Vision Assessment?: No apparent visual deficits ?  ?Perception Perception ?Perception: Not tested ?  ?Praxis Praxis ?Praxis: Not tested ?  ? ?Cognition Arousal/Alertness: Awake/alert ?Behavior During Therapy: [facility] for tasks assessed/performed ?Overall Cognitive Status: Within Functional Limits for tasks  assessed ?  ?  ?  ?  ?  ?  ?  ?  ?  ?  ?  ?  ?  ?  ?  ?  ?General Comments: cog WFL for all tasks assessed this session ?  ?  ?   ?Exercises   ? ?  ?Shoulder Instructions   ? ? ?  ?General Comments VSS on RA  ? ? ?Pertinent Vitals/ Pain       Pain Assessment ?Pain Assessment: No/denies pain ?Pain Intervention(s): Monitored during session ? ?Home Living   ?  ?  ?  ?  ?  ?  ?  ?  ?  ?  ?  ?  ?  ?  ?  ?  ?  ?  ? ?  ?Prior Functioning/Environment    ?  ?  ?  ?   ? ?Frequency ? Min 2X/week  ? ? ? ? ?  ?Progress Toward Goals ? ?OT Goals(current goals can now be found in the care plan section) ? Progress towards OT goals: Progressing toward goals ? ?Acute Rehab OT Goals ?Patient Stated Goal: go to rehab ?OT Goal Formulation: With patient ?Time For Goal Achievement: 07/21/21 ?Potential to Achieve Goals: Good ?ADL Goals ?Pt Will Perform Grooming: standing ?Pt Will Perform Lower Body Dressing: with modified independence;sit to/from stand ?Pt Will Transfer to Toilet: with modified independence ?Additional ADL Goal #1: Pt will indep recall at least 3 fall prevention strategies to apply to the home setting  ?Plan Discharge plan remains appropriate   ? ?Co-evaluation ? ? ?   ?  ?  ?  ?  ? ?  ?AM-PAC OT "6 Clicks" Daily Activity     ?Outcome Measure ? ? Help from another person eating meals?: None ?Help from another person taking care of personal grooming?: A Little ?Help from another person toileting, which includes using toliet, bedpan, or urinal?: A Little ?Help from another person bathing (including washing, rinsing, drying)?: A Little ?Help from another person to put on and taking off regular upper body clothing?: None ?Help from another person to put on and taking off regular lower body clothing?: A Little ?6 Click Score: 20 ? ?  ?End of Session Equipment Utilized During Treatment: Rolling walker (2 wheels) ? ?OT Visit Diagnosis: Other abnormalities of gait and mobility (R26.89);Unsteadiness on feet (R26.81);Repeated falls  (R29.6);Muscle weakness (generalized) (M62.81);History of falling (Z91.81);Pain ?  ?Activity Tolerance Patient tolerated treatment well ?  ?Patient Left with call bell/phone within reach;in chair ?  ?Nurse Communication Mobility status ?  ? ?   ? ?Time: 3557-3220 ?OT Time Calculation (min): 19 min ? ?Charges: OT General Charges ?$OT Visit: 1 Visit ?OT Treatments ?$Self Care/Home Management : 8-22 mins ? ? ? ?Jadiel Schmieder A Sande Pickert ?07/16/2021, 4:08 PM ?

## 2021-07-16 NOTE — Assessment & Plan Note (Signed)
Patient complaining of increased urinary frequency, malodor.  Urine culture from 2/27 with gram-negative rods.  Started Keflex 5 mg p.o. twice daily, plan 3-day course.  Follow-up finalized urine culture which was pending at time of discharge. ?

## 2021-07-16 NOTE — TOC Transition Note (Addendum)
Transition of Care (TOC) - CM/SW Discharge Note ? ? ?Patient Details  ?Name: [name] ?MRN: [id number] ?Date of Birth: 10/19/1960 ? ?Transition of Care (TOC) CM/SW Contact:  ?Kermit Balo, RN ?Phone Number: ?07/16/2021, 11:20 AM ? ? ?Clinical Narrative:    ?Patient is discharging to [facility] today. Pt will transport via ambulance. Daughter has been updated and will sign paperwork at he facility. Bedside RN updated and d/c packet is at the desk.  ? ?Room: 3209 ?Number for report: [phone] ? ? ?Final next level of care: Skilled Nursing Facility ?Barriers to Discharge: No Barriers Identified ? ? ?Patient Goals and CMS Choice ?  ?CMS Medicare.gov Compare Post Acute Care list provided to:: Patient ?Choice offered to / list presented to : Patient ? ?Discharge Placement ?  ?           ?  ?  ?  ?  ? ?Discharge Plan and Services ?In-house Referral: Clinical Social Work ?Discharge Planning Services: CM Consult ?Post Acute Care Choice: Skilled Nursing Facility          ?  ?  ?  ?  ?  ?  ?  ?  ?  ?  ? ?Social Determinants of Health (SDOH) Interventions ?  ? ? ?Readmission Risk Interventions ?No flowsheet data found. ? ? ? ? ?

## 2021-07-16 NOTE — Discharge Summary (Signed)
Physician Discharge Summary  [name] ZJI:[id number] DOB: 06-Jan-1961 DOA: 07/06/2021  PCP: Beverley Fiedler, FNP  Admit date: 07/06/2021 Discharge date: 07/16/2021  Admitted From: Home Disposition: [facility]  Recommendations for Outpatient Follow-up:  Follow up with PCP in 1-2 weeks Follow-up with neurology, Dr. Felecia Shelling in 2-3 weeks Discharging on 3-day course of Keflex for gram-negative rod UTI Please follow up on the following pending results: Finalized urine culture results  Home Health: No Equipment/Devices: None  Discharge Condition: Stable CODE STATUS: Full code Diet recommendation: Heart healthy/consistent carbohydrate diet  History of present illness:  [name] is a 61 year old female with past medical history significant for multiple sclerosis, essential hypertension, type 2 diabetes mellitus, obesity, hyperlipidemia who presented to [facility] ED on 2/19 following a fall, paresthesias to bilateral lower extremities, progressive weakness.  Onset 4 days prior with gradual progression.  CT head in the ED with no acute findings.  Concern for MS flare, although MRI with no active demyelination.  Neurology was consulted, [facility] consulted for further evaluation and management of progressive weakness concerning for MS flare.   Hospital course:  Assessment and Plan: * Multiple sclerosis ([place])- (present on admission) Patient presenting to the ED with progressive weakness associated with paresthesias to bilateral lower extremities.  CT head without contrast unrevealing.  MRI brain, cervical/thoracic spine without evidence to suggest acute demyelination.  Neurology was consulted and followed during hospital course.  Even though imaging does not show any active demyelination, clinical picture with sensory deficits and neurology recommended 5-day course of IV steroids.  Patient completed 5-day course of IV steroids and was seen by PT/OT with recommendations of discharge to SNF.   Per neurology, may resume home Tecfidera on discharge.  Outpatient follow-up with neurology 2-4 weeks.  UTI (urinary tract infection)- (present on admission) Patient complaining of increased urinary frequency, malodor.  Urine culture from 2/27 with gram-negative rods.  Started Keflex 5 mg p.o. twice daily, plan 3-day course.  Follow-up finalized urine culture which was pending at time of discharge.  Essential hypertension- (present on admission) Continue amlodipine 10 mg p.o. daily, losartan 100 mg p.o. daily  Chronic pain- (present on admission) Continue home Zanaflex, oxycodone, Trileptal, Cymbalta, baclofen, gabapentin.  Diabetic peripheral neuropathy ([place])- (present on admission) Continue Trileptal, baclofen, amitriptyline, Cymbalta, gabapentin  Obesity, Class III, BMI 40-49.9 (morbid obesity) ([place])- (present on admission) Discussed with patient needs for aggressive lifestyle changes/weight loss as this complicates all facets of care.  Outpatient follow-up with PCP.   DM (diabetes mellitus), type 2 with complications ([place])- (present on admission) Hemoglobin A1c 5.5 on 07/06/2021, well controlled.  Continue home Lantus 40 units obviously daily, Humalog 3-6 units 3 times daily AC.       Discharge Diagnoses:  Principal Problem:   Multiple sclerosis ([place]) Active Problems:   UTI (urinary tract infection)   Essential hypertension   DM (diabetes mellitus), type 2 with complications (HCC)   Obesity, Class III, BMI 40-49.9 (morbid obesity) ([place])   Diabetic peripheral neuropathy (HCC)   Chronic pain    Discharge Instructions  Discharge Instructions     Call MD for:  difficulty breathing, headache or visual disturbances   Complete by: As directed    Call MD for:  extreme fatigue   Complete by: As directed    Call MD for:  persistant dizziness or light-headedness   Complete by: As directed    Call MD for:  persistant nausea and vomiting   Complete by: As directed  Call MD  for:  severe uncontrolled pain   Complete by: As directed    Call MD for:  temperature >100.4   Complete by: As directed    Diet - low sodium heart healthy   Complete by: As directed    Increase activity slowly   Complete by: As directed    No wound care   Complete by: As directed       Allergies as of 07/16/2021   No Known Allergies      Medication List     STOP taking these medications    dalfampridine 10 MG Tb12   eszopiclone 1 MG Tabs tablet Commonly known as: LUNESTA   triamterene-hydrochlorothiazide 37.5-25 MG tablet Commonly known as: MAXZIDE-25       TAKE these medications    Accu-Chek Guide test strip Generic drug: glucose blood Use as instructed up to 4 times daily   Accu-Chek Guide w/Device Kit use as directed   Accu-Chek Softclix Lancets lancets Use as directed up to 4 times daily   amantadine 100 MG capsule Commonly known as: SYMMETREL Take 1 capsule (100 mg total) by mouth 2 (two) times daily.   amitriptyline 25 MG tablet Commonly known as: ELAVIL TAKE 1 TABLET(25 MG) BY MOUTH AT BEDTIME   amLODipine 10 MG tablet Commonly known as: NORVASC Take 1 tablet (10 mg total) by mouth daily. What changed:  medication strength how much to take Another medication with the same name was removed. Continue taking this medication, and follow the directions you see here.   Anti-Diarrheal 2 MG tablet Generic drug: loperamide Take 2 mg by mouth in the morning and at bedtime.   atorvastatin 80 MG tablet Commonly known as: LIPITOR Take 1 tablet (80 mg total) by mouth daily. What changed: when to take this   baclofen 20 MG tablet Commonly known as: LIORESAL Take 20 mg by mouth 2 (two) times daily.   BD Pen Needle Nano U/F 32G X 4 MM Misc Generic drug: Insulin Pen Needle use as directed 4 times daily   busPIRone 15 MG tablet Commonly known as: BUSPAR Take 15 mg by mouth 2 (two) times daily.   cephALEXin 500 MG capsule Commonly known as:  KEFLEX Take 1 capsule (500 mg total) by mouth every 12 (twelve) hours for 3 days.   diphenoxylate-atropine 2.5-0.025 MG tablet Commonly known as: LOMOTIL Take 1 tablet by mouth in the morning and at bedtime.   DULoxetine 60 MG capsule Commonly known as: CYMBALTA Take 60 mg by mouth 2 (two) times daily.   fluticasone 50 MCG/ACT nasal spray Commonly known as: FLONASE Place 2 sprays into the nose daily. What changed: when to take this   gabapentin 400 MG capsule Commonly known as: NEURONTIN Take 2 capsules (800 mg total) by mouth 2 (two) times daily.   HAIR/SKIN/NAILS PO Take 3 tablets by mouth in the morning and at bedtime.   HumaLOG KwikPen 100 UNIT/ML KwikPen Generic drug: insulin lispro Inject 10 Units into the skin 3 (three) times daily with meals. What changed:  how much to take additional instructions   hydrALAZINE 50 MG tablet Commonly known as: APRESOLINE Take 1 tablet (50 mg total) by mouth every 8 (eight) hours.   ibuprofen 800 MG tablet Commonly known as: ADVIL Take 800 mg by mouth 3 (three) times daily as needed for moderate pain.   lamoTRIgine 200 MG tablet Commonly known as: LAMICTAL TAKE 1 TABLET BY MOUTH TWICE DAILY   Lantus SoloStar 100 UNIT/ML Solostar Pen  Generic drug: insulin glargine Inject 40 Units into the skin daily.   LEG CRAMPS PO Place 3 tablets under the tongue daily as needed (For leg cramps).   losartan 100 MG tablet Commonly known as: COZAAR Take 1 tablet (100 mg total) by mouth daily.   MAGnesium-Oxide 400 (240 Mg) MG tablet Generic drug: magnesium oxide Take 1 tablet by mouth daily.   Melatonin Maximum Strength 5 MG Tabs Generic drug: melatonin Take 5 mg by mouth at bedtime.   montelukast 10 MG tablet Commonly known as: SINGULAIR Take 10 mg by mouth at bedtime.   Narcan 4 MG/0.1ML Liqd nasal spray kit Generic drug: naloxone Place 1 spray into the nose as directed. overdose   Oxcarbazepine 300 MG tablet Commonly known  as: TRILEPTAL Take 1 tablet (300 mg total) by mouth 2 (two) times daily.   oxyCODONE 15 MG immediate release tablet Commonly known as: ROXICODONE Take 1 tablet (15 mg total) by mouth every 6 (six) hours as needed for pain.   pantoprazole 40 MG tablet Commonly known as: PROTONIX Take 40 mg by mouth 2 (two) times daily.   Tecfidera 240 MG Cpdr Generic drug: Dimethyl Fumarate Take 1 capsule (240 mg total) by mouth 2 (two) times daily.   tiZANidine 2 MG tablet Commonly known as: ZANAFLEX Take 4 mg by mouth in the morning and at bedtime.   Viberzi 100 MG Tabs Generic drug: Eluxadoline Take 100 mg by mouth 2 (two) times daily.   vitamin B-12 1000 MCG tablet Commonly known as: CYANOCOBALAMIN Take 1,000 mcg by mouth in the morning.   Vitamin D-3 125 MCG (5000 UT) Tabs Take 5,000 Units by mouth in the morning.   Xiidra 5 % Soln Generic drug: Lifitegrast Place 1 drop into both eyes in the morning and at bedtime.   zolpidem 10 MG tablet Commonly known as: AMBIEN Take 1 tablet (10 mg total) by mouth at bedtime.        Follow-up Information     Beverley Fiedler, FNP. Schedule an appointment as soon as possible for a visit in 1 week(s).   Specialty: Endocrinology Contact information: [place] [address] [phone]         Britt Bottom, MD. Schedule an appointment as soon as possible for a visit in 2 week(s).   Specialty: Neurology Contact information: [address] [place] [address] [phone]                No Known Allergies  Consultations: Neurology   Procedures/Studies: CT Angio Head W or Wo Contrast  Result Date: 06/29/2021 CLINICAL DATA:  Initial evaluation for acute headache. EXAM: CT ANGIOGRAPHY HEAD AND NECK TECHNIQUE: Multidetector CT imaging of the head and neck was performed using the standard protocol during bolus administration of intravenous contrast. Multiplanar CT image reconstructions and MIPs were  obtained to evaluate the vascular anatomy. Carotid stenosis measurements (when applicable) are obtained utilizing NASCET criteria, using the distal internal carotid diameter as the denominator. RADIATION DOSE REDUCTION: This exam was performed according to the departmental dose-optimization program which includes automated exposure control, adjustment of the mA and/or kV according to patient size and/or use of iterative reconstruction technique. CONTRAST:  137m OMNIPAQUE IOHEXOL 350 MG/ML SOLN COMPARISON:  MRI from 02/18/2021. FINDINGS: CT HEAD FINDINGS Brain: Cerebral volume within normal limits. Known cerebral white matter disease not well seen by CT. No acute intracranial hemorrhage. No acute large vessel territory infarct. No mass lesion or midline shift. No hydrocephalus or extra-axial  fluid collection. Partially empty sella noted. Vascular: No hyperdense vessel. Skull: Scalp soft tissues and calvarium within normal limits. Sinuses: Clear. Orbits: Unremarkable. Review of the MIP images confirms the above findings CTA NECK FINDINGS Aortic arch: Visualized aortic arch normal caliber with normal branch pattern. Mild atheromatous change about the origin of the great vessels without significant stenosis. Right carotid system: Right common and internal carotid arteries partially medialized into the retropharyngeal space but are widely patent without stenosis or dissection. Mild atheromatous change about the right carotid bulb without flow-limiting stenosis. Approximate 50% stenosis noted at the origin of the right external carotid artery. Left carotid system: Left common and internal carotid arteries partially medialized into the retropharyngeal space but are widely patent without stenosis or dissection. Mild atheromatous change about the left carotid bulb without significant stenosis. Vertebral arteries: Both vertebral arteries arise from the subclavian arteries. No visible proximal subclavian artery stenosis.  Visualized portions of the vertebral arteries patent without stenosis or dissection. Skeleton: No discrete or worrisome osseous lesions. Moderate spondylosis present at C5-6 and C6-7. Other neck: No other acute soft tissue abnormality within the neck. Upper chest: Visualized upper chest demonstrates no acute finding. Review of the MIP images confirms the above findings CTA HEAD FINDINGS Anterior circulation: Both internal carotid arteries patent to the termini without stenosis. Right A1 patent. Left A1 hypoplastic and/or absent, accounting for the diminutive left ICA is compared to the right. Normal anterior communicating artery complex. Anterior cerebral arteries patent without stenosis. No M1 stenosis or occlusion. Normal MCA bifurcations. Distal MCA branches perfused and fairly symmetric. Posterior circulation: Both V4 segments patent to the vertebrobasilar junction without stenosis. Both PICA origins patent and normal. Basilar widely patent to its distal aspect without stenosis. Superior cerebellar arteries patent bilaterally. Both PCAs primarily supplied via the basilar and are well perfused to there distal aspects. Venous sinuses: Patent allowing for timing the contrast bolus. Anatomic variants: Hypoplastic/absent left A1. No intracranial aneurysm or other vascular malformation. Review of the MIP images confirms the above findings IMPRESSION: 1. Negative CTA of the head and neck. No large vessel occlusion or other acute vascular abnormality. 2. 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 3. No other acute intracranial abnormality. Electronically Signed   By: Jeannine Boga M.D.   On: 06/29/2021 22:05   DG Tibia/Fibula Right  Result Date: 07/06/2021 CLINICAL DATA:  Fall, leg pain EXAM: RIGHT TIBIA AND FIBULA - 2 VIEW COMPARISON:  None. FINDINGS: No acute fracture or dislocation identified. No significant soft tissue abnormality  identified. IMPRESSION: No acute fracture identified. Electronically Signed   By: Ofilia Neas M.D.   On: 07/06/2021 14:33   CT Head Wo Contrast  Result Date: 07/06/2021 CLINICAL DATA:  61 year old female with altered mental status. History of multiple sclerosis. EXAM: CT HEAD WITHOUT CONTRAST TECHNIQUE: Contiguous axial images were obtained from the base of the skull through the vertex without intravenous contrast. RADIATION DOSE REDUCTION: This exam was performed according to the departmental dose-optimization program which includes automated exposure control, adjustment of the mA and/or kV according to patient size and/or use of iterative reconstruction technique. COMPARISON:  06/29/2021 head CT and prior studies FINDINGS: Brain: No evidence of acute infarction, hemorrhage, hydrocephalus, extra-axial collection or mass lesion/mass effect. No white matter abnormalities on prior MRs are difficult to visualized on this study. Vascular: No hyperdense vessel or unexpected calcification. Skull: Normal. Negative for fracture or focal lesion. Sinuses/Orbits: No acute finding. Other: None. IMPRESSION: No evidence of  acute intracranial abnormality. Electronically Signed   By: Margarette Canada M.D.   On: 07/06/2021 14:51   CT Angio Neck W and/or Wo Contrast  Result Date: 06/29/2021 CLINICAL DATA:  Initial evaluation for acute headache. EXAM: CT ANGIOGRAPHY HEAD AND NECK TECHNIQUE: Multidetector CT imaging of the head and neck was performed using the standard protocol during bolus administration of intravenous contrast. Multiplanar CT image reconstructions and MIPs were obtained to evaluate the vascular anatomy. Carotid stenosis measurements (when applicable) are obtained utilizing NASCET criteria, using the distal internal carotid diameter as the denominator. RADIATION DOSE REDUCTION: This exam was performed according to the departmental dose-optimization program which includes automated exposure control, adjustment  of the mA and/or kV according to patient size and/or use of iterative reconstruction technique. CONTRAST:  168m OMNIPAQUE IOHEXOL 350 MG/ML SOLN COMPARISON:  MRI from 02/18/2021. FINDINGS: CT HEAD FINDINGS Brain: Cerebral volume within normal limits. Known cerebral white matter disease not well seen by CT. No acute intracranial hemorrhage. No acute large vessel territory infarct. No mass lesion or midline shift. No hydrocephalus or extra-axial fluid collection. Partially empty sella noted. Vascular: No hyperdense vessel. Skull: Scalp soft tissues and calvarium within normal limits. Sinuses: Clear. Orbits: Unremarkable. Review of the MIP images confirms the above findings CTA NECK FINDINGS Aortic arch: Visualized aortic arch normal caliber with normal branch pattern. Mild atheromatous change about the origin of the great vessels without significant stenosis. Right carotid system: Right common and internal carotid arteries partially medialized into the retropharyngeal space but are widely patent without stenosis or dissection. Mild atheromatous change about the right carotid bulb without flow-limiting stenosis. Approximate 50% stenosis noted at the origin of the right external carotid artery. Left carotid system: Left common and internal carotid arteries partially medialized into the retropharyngeal space but are widely patent without stenosis or dissection. Mild atheromatous change about the left carotid bulb without significant stenosis. Vertebral arteries: Both vertebral arteries arise from the subclavian arteries. No visible proximal subclavian artery stenosis. Visualized portions of the vertebral arteries patent without stenosis or dissection. Skeleton: No discrete or worrisome osseous lesions. Moderate spondylosis present at C5-6 and C6-7. Other neck: No other acute soft tissue abnormality within the neck. Upper chest: Visualized upper chest demonstrates no acute finding. Review of the MIP images confirms the  above findings CTA HEAD FINDINGS Anterior circulation: Both internal carotid arteries patent to the termini without stenosis. Right A1 patent. Left A1 hypoplastic and/or absent, accounting for the diminutive left ICA is compared to the right. Normal anterior communicating artery complex. Anterior cerebral arteries patent without stenosis. No M1 stenosis or occlusion. Normal MCA bifurcations. Distal MCA branches perfused and fairly symmetric. Posterior circulation: Both V4 segments patent to the vertebrobasilar junction without stenosis. Both PICA origins patent and normal. Basilar widely patent to its distal aspect without stenosis. Superior cerebellar arteries patent bilaterally. Both PCAs primarily supplied via the basilar and are well perfused to there distal aspects. Venous sinuses: Patent allowing for timing the contrast bolus. Anatomic variants: Hypoplastic/absent left A1. No intracranial aneurysm or other vascular malformation. Review of the MIP images confirms the above findings IMPRESSION: 1. Negative CTA of the head and neck. No large vessel occlusion or other acute vascular abnormality. 2. 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 3. No other acute intracranial abnormality. Electronically Signed   By: BJeannine BogaM.D.   On: 06/29/2021 22:05   MR BRAIN W WO CONTRAST  Result Date: 07/07/2021 CLINICAL DATA:  Multiple sclerosis.  Bilateral leg numbness and worsening weakness with a fall. EXAM: MRI HEAD WITHOUT AND WITH CONTRAST TECHNIQUE: Multiplanar, multiecho pulse sequences of the brain and surrounding structures were obtained without and with intravenous contrast. CONTRAST:  48m GADAVIST GADOBUTROL 1 MMOL/ML IV SOLN COMPARISON:  Head CT 07/06/2021 and head MRI 02/18/2021 FINDINGS: Brain: There is no evidence of an acute infarct, intracranial hemorrhage, mass, midline shift, or extra-axial fluid collection. The ventricles  and sulci are normal. Scattered small T2 hyperintensities in the juxtacortical and periventricular white matter bilaterally are unchanged. No definite lesions are identified in the posterior fossa. No abnormal enhancement identified. Vascular: Major intracranial vascular flow voids are preserved. Skull and upper cervical spine: Unremarkable bone marrow signal. Sinuses/Orbits: Unremarkable orbits. Paranasal sinuses and mastoid air cells are clear. Other: None. IMPRESSION: Unchanged cerebral white matter disease consistent with the history of multiple sclerosis. No evidence of active demyelination or other acute intracranial abnormality. Electronically Signed   By: ALogan BoresM.D.   On: 07/07/2021 18:12   MR CERVICAL SPINE W WO CONTRAST  Result Date: 07/07/2021 CLINICAL DATA:  Multiple sclerosis. Bilateral leg numbness and worsening weakness with a fall. EXAM: MRI CERVICAL SPINE WITHOUT AND WITH CONTRAST TECHNIQUE: Multiplanar and multiecho pulse sequences of the cervical spine, to include the craniocervical junction and cervicothoracic junction, were obtained without and with intravenous contrast. CONTRAST:  122mGADAVIST GADOBUTROL 1 MMOL/ML IV SOLN COMPARISON:  Cervical spine MRI 02/18/2021 FINDINGS: Alignment: Straightening of the normal cervical lordosis. Chronic trace anterolisthesis of C4 on C5. Vertebrae: No fracture or suspicious marrow lesion. Degenerative endplate changes and moderate disc space narrowing at C5-6 and C6-7. Cord: Assessment of the cord is limited by motion artifact, predominantly on axial sequences. Faint T2 hyperintensity in the right greater than left spinal cord at C2-3 and suspected hazy T2 hyperintensity in the cord bilaterally at C3-4 have not convincingly changed, and no definite new cord lesion is identified. No abnormal intradural enhancement is evident. Posterior Fossa, vertebral arteries, paraspinal tissues: Posterior fossa more fully evaluated on today's separate head MRI.  Preserved vertebral artery flow voids. Partially visualized endotracheal tube. Disc levels: C2-3: Mild disc bulging, uncovertebral spurring, and mild facet arthrosis without significant stenosis, unchanged. C3-4: Disc bulging, uncovertebral spurring, and mild facet arthrosis result in borderline to mild bilateral neural foraminal stenosis without spinal stenosis, unchanged. C4-5: Anterolisthesis with bulging uncovered disc, right greater than left uncovertebral spurring, and moderate left facet arthrosis result in mild spinal stenosis and mild right neural foraminal stenosis, unchanged. C5-6: A broad-based posterior disc osteophyte complex, small central disc protrusion, and mild facet arthrosis result in mild spinal stenosis and moderate right and severe left neural foraminal stenosis, unchanged. C6-7: Broad-based posterior disc osteophyte complex eccentric to the right and mild facet arthrosis result in mild spinal stenosis and moderate to severe right and mild left neural foraminal stenosis, unchanged. C7-T1: Moderate left greater than right facet arthrosis without significant stenosis, unchanged. IMPRESSION: 1. Faint chronic signal abnormality in the upper cervical spinal cord consistent with the history of multiple sclerosis. No convincing new lesions or evidence of active demyelination. 2. Unchanged cervical disc and facet degeneration resulting in mild spinal stenosis from C4-5 to C6-7. 3. Moderate to severe multilevel neural foraminal stenosis as above. Electronically Signed   By: AlLogan Bores.D.   On: 07/07/2021 18:10   MR THORACIC SPINE W WO CONTRAST  Result Date: 07/07/2021 CLINICAL DATA:  Multiple sclerosis. Bilateral leg numbness and worsening weakness with a fall. EXAM: MRI THORACIC WITHOUT  AND WITH CONTRAST TECHNIQUE: Multiplanar and multiecho pulse sequences of the thoracic spine were obtained without and with intravenous contrast. CONTRAST:  69m GADAVIST GADOBUTROL 1 MMOL/ML IV SOLN  COMPARISON:  Thoracic spine MRI 03/02/2012 FINDINGS: Alignment:  Normal. Vertebrae: No fracture, suspicious marrow lesion, or significant marrow edema. Cord: T2 hyperintensity in volume loss in the spinal cord at T6 are unchanged from the prior MRI. No definite other thoracic spinal cord lesions are identified although assessment is limited by motion artifact on axial sequences. No abnormal enhancement is identified. Paraspinal and other soft tissues: Unremarkable. Disc levels: Mild disc bulging at T1-2 and in the lower thoracic and included upper lumbar spine without significant stenosis. IMPRESSION: Unchanged spinal cord lesion at T6 with volume loss consistent with chronic multiple sclerosis. No definite new thoracic cord lesions or evidence of active demyelination. Electronically Signed   By: ALogan BoresM.D.   On: 07/07/2021 18:34   DG Chest Portable 1 View  Result Date: 07/06/2021 CLINICAL DATA:  Weakness. EXAM: PORTABLE CHEST 1 VIEW COMPARISON:  12/10/2020 FINDINGS: 1401 hours. Low lung volumes. The cardio pericardial silhouette is enlarged. There is pulmonary vascular congestion without overt pulmonary edema. Diffuse interstitial opacity suggests edema. No substantial pleural effusion. The visualized bony structures of the thorax show no acute abnormality. IMPRESSION: Low volume film with vascular congestion and probable interstitial pulmonary edema. Electronically Signed   By: EMisty StanleyM.D.   On: 07/06/2021 14:37   DG Knee Complete 4 Views Right  Result Date: 07/06/2021 CLINICAL DATA:  Fall, knee pain EXAM: RIGHT KNEE - COMPLETE 4+ VIEW COMPARISON:  None. FINDINGS: No acute fracture or dislocation identified. Moderate degenerative changes at the patellofemoral joint and mild at the medial and lateral femorotibial compartments. Tricompartmental osteophytes. No joint effusion visualized. IMPRESSION: Chronic changes with no acute fracture identified. Electronically Signed   By: DOfilia Neas M.D.   On: 07/06/2021 14:22   DG Foot Complete Right  Result Date: 07/06/2021 CLINICAL DATA:  Fall, foot pain EXAM: RIGHT FOOT COMPLETE - 3+ VIEW COMPARISON:  None. FINDINGS: No acute fracture or dislocation identified. Bones are osteopenic. Old fracture deformity of the fifth metatarsal. Advanced degenerative changes of the first metatarsophalangeal joint with severe joint space narrowing, subchondral sclerosis and marginal osteophytes. Plantar calcaneal spur. IMPRESSION: No acute osseous abnormality identified. Multiple chronic findings as described. Electronically Signed   By: DOfilia NeasM.D.   On: 07/06/2021 14:21   ECHOCARDIOGRAM COMPLETE  Result Date: 07/07/2021    ECHOCARDIOGRAM REPORT   [facility] Medical Rec #:  [id number]    Height:       63.0 in Accession #:    [id number]   Weight:       260.0 lb Date of Birth:  8January 23, 1962    BSA:          2.162 m Patient Age:    89years      BP:           143/77 mmHg Patient Gender: F             HR:           91 bpm. Exam Location:  Inpatient Procedure: 2D Echo, Color Doppler and Cardiac Doppler Indications:    I50.9* Heart failure (unspecified)  History:        Patient has no prior history of Echocardiogram examinations.  Risk Factors:Hypertension and Diabetes.  Sonographer:    Raquel Sarna Senior RDCS Referring Phys: [place]  1. Left ventricular ejection fraction, by estimation, is 60 to 65%. The left ventricle has normal function. The left ventricle has no regional wall motion abnormalities. There is mild left ventricular hypertrophy. Left ventricular diastolic parameters were normal.  2. Right ventricular systolic function is normal. The right ventricular size is normal.  3. The mitral valve is normal in structure. No evidence of mitral valve regurgitation.  4. The aortic valve is tricuspid. Aortic valve regurgitation is not visualized. No aortic stenosis is present.  FINDINGS  Left Ventricle: Left ventricular ejection fraction, by estimation, is 60 to 65%. The left ventricle has normal function. The left ventricle has no regional wall motion abnormalities. The left ventricular internal cavity size was normal in size. There is  mild left ventricular hypertrophy. Left ventricular diastolic parameters were normal. Right Ventricle: The right ventricular size is normal. Right vetricular wall thickness was not well visualized. Right ventricular systolic function is normal. Left Atrium: Left atrial size was normal in size. Right Atrium: Right atrial size was normal in size. Pericardium: There is no evidence of pericardial effusion. Mitral Valve: The mitral valve is normal in structure. No evidence of mitral valve regurgitation. Tricuspid Valve: The tricuspid valve is normal in structure. Tricuspid valve regurgitation is trivial. Aortic Valve: The aortic valve is tricuspid. Aortic valve regurgitation is not visualized. No aortic stenosis is present. Pulmonic Valve: The pulmonic valve was normal in structure. Pulmonic valve regurgitation is not visualized. Aorta: The aortic root and ascending aorta are structurally normal, with no evidence of dilitation. IAS/Shunts: The atrial septum is grossly normal.  LEFT VENTRICLE PLAX 2D LVIDd:         4.20 cm   Diastology LVIDs:         2.40 cm   LV e' medial:    7.83 cm/s LV PW:         1.50 cm   LV E/e' medial:  11.0 LV IVS:        1.10 cm   LV e' lateral:   7.51 cm/s LVOT diam:     2.10 cm   LV E/e' lateral: 11.5 LV SV:         73 LV SV Index:   34 LVOT Area:     3.46 cm  RIGHT VENTRICLE RV S prime:     14.30 cm/s TAPSE (M-mode): 2.3 cm LEFT ATRIUM             Index        RIGHT ATRIUM           Index LA diam:        3.40 cm 1.57 cm/m   RA Area:     12.50 cm LA Vol (A2C):   56.3 ml 26.04 ml/m  RA Volume:   29.70 ml  13.74 ml/m LA Vol (A4C):   53.2 ml 24.61 ml/m LA Biplane Vol: 55.0 ml 25.44 ml/m  AORTIC VALVE LVOT Vmax:   107.00 cm/s LVOT  Vmean:  82.300 cm/s LVOT VTI:    0.211 m  AORTA Ao Root diam: 2.90 cm Ao Asc diam:  3.60 cm MITRAL VALVE MV Area (PHT): 4.36 cm    SHUNTS MV Decel Time: 174 msec    Systemic VTI:  0.21 m MV E velocity: 86.10 cm/s  Systemic Diam: 2.10 cm MV A velocity: 64.30 cm/s MV E/A ratio:  1.34 Mertie Moores MD Electronically signed  by Mertie Moores MD Signature Date/Time: 07/07/2021/10:19:56 AM    Final      Subjective:   Discharge Exam: Vitals:   07/16/21 0036 07/16/21 0432  BP: (!) 147/81 125/83  Pulse: 91 88  Resp: 16 17  Temp: 98.3 F (36.8 C) 98.3 F (36.8 C)  SpO2: 100% 99%   Vitals:   07/15/21 1654 07/15/21 2104 07/16/21 0036 07/16/21 0432  BP: (!) 158/73 (!) 154/69 (!) 147/81 125/83  Pulse: 98 90 91 88  Resp: _0 Temp: 99.2 F (37.3 C) 98.8 F (37.1 C) 98.3 F (36.8 C) 98.3 F (36.8 C)  TempSrc: Oral Oral Oral Oral  SpO2: 100% 98% 100% 99%    Physical Exam: GEN: NAD, alert and oriented x 3, wd/wn HEENT: NCAT, PERRL, EOMI, sclera clear, MMM PULM: CTAB w/o wheezes/crackles, normal respiratory effort, on room air CV: RRR w/o M/G/R GI: abd soft, NTND, NABS, no R/G/M MSK: no peripheral edema, muscle strength globally intact 5/5 bilateral upper/lower extremities NEURO: CN II-XII intact, no focal deficits, sensation to light touch intact PSYCH: normal mood/affect Integumentary: dry/intact, no rashes or wounds    The results of significant diagnostics from this hospitalization (including imaging, microbiology, ancillary and laboratory) are listed below for reference.     Microbiology: Recent Results (from the past 240 hour(s))  Resp Panel by RT-PCR (Flu A&B, Covid) Nasopharyngeal Swab     Status: None   Collection Time: 07/06/21  6:36 PM   Specimen: Nasopharyngeal Swab; Nasopharyngeal(NP) swabs in vial transport medium  Result Value Ref Range Status   SARS Coronavirus 2 by RT PCR NEGATIVE NEGATIVE Final    Comment: (NOTE) SARS-CoV-2 target nucleic acids are NOT  DETECTED.  The SARS-CoV-2 RNA is generally detectable in upper respiratory specimens during the acute phase of infection. The lowest concentration of SARS-CoV-2 viral copies this assay can detect is 138 copies/mL. A negative result does not preclude SARS-Cov-2 infection and should not be used as the sole basis for treatment or other patient management decisions. A negative result may occur with  improper specimen collection/handling, submission of specimen other than nasopharyngeal swab, presence of viral mutation(s) within the areas targeted by this assay, and inadequate number of viral copies(<138 copies/mL). A negative result must be combined with clinical observations, patient history, and epidemiological information. The expected result is Negative.  Fact Sheet for Patients:  EntrepreneurPulse.com.au  Fact Sheet for Healthcare Providers:  IncredibleEmployment.be  This test is no t yet approved or cleared by the [place] FDA and  has been authorized for detection and/or diagnosis of SARS-CoV-2 by FDA under an Emergency Use Authorization (EUA). This EUA will remain  in effect (meaning this test can be used) for the duration of the COVID-19 declaration under Section 564(b)(1) of the Act, 21 U.S.C.section 360bbb-3(b)(1), unless the authorization is terminated  or revoked sooner.       Influenza A by PCR NEGATIVE NEGATIVE Final   Influenza B by PCR NEGATIVE NEGATIVE Final    Comment: (NOTE) The Xpert Xpress SARS-CoV-2/FLU/RSV plus assay is intended as an aid in the diagnosis of influenza from Nasopharyngeal swab specimens and should not be used as a sole basis for treatment. Nasal washings and aspirates are unacceptable for Xpert Xpress SARS-CoV-2/FLU/RSV testing.  Fact Sheet for Patients: EntrepreneurPulse.com.au  Fact Sheet for Healthcare Providers: IncredibleEmployment.be  This test is not yet  approved or cleared by the [place] FDA and has been authorized for detection and/or diagnosis of SARS-CoV-2 by FDA under an  Emergency Use Authorization (EUA). This EUA will remain in effect (meaning this test can be used) for the duration of the COVID-19 declaration under Section 564(b)(1) of the Act, 21 U.S.C. section 360bbb-3(b)(1), unless the authorization is terminated or revoked.  Performed at [facility], [place] [address] [place], [place] [address]   MRSA Next Gen by PCR, Nasal     Status: Abnormal   Collection Time: 07/08/21  6:35 PM   Specimen: Nasal Mucosa; Nasal Swab  Result Value Ref Range Status   MRSA by PCR Next Gen DETECTED (A) NOT DETECTED Final    Comment: RESULT CALLED TO, READ BACK BY AND VERIFIED WITH: PHYL,RN_0  07/08/21 [place] (NOTE) The GeneXpert MRSA Assay (FDA approved for NASAL specimens only), is one component of a comprehensive MRSA colonization surveillance program. It is not intended to diagnose MRSA infection nor to guide or monitor treatment for MRSA infections. Test performance is not FDA approved in patients less than 21 years old. Performed at [facility], [place] [address], [place], [place] [address]   Urine Culture     Status: Abnormal (Preliminary result)   Collection Time: 07/14/21  8:21 PM   Specimen: Urine, Clean Catch  Result Value Ref Range Status   Specimen Description URINE, CLEAN CATCH  Final   Special Requests NONE  Final   Culture (A)  Final    70,000 COLONIES/mL GRAM NEGATIVE RODS CULTURE REINCUBATED FOR BETTER GROWTH SUSCEPTIBILITIES TO FOLLOW Performed at [facility], [place] [address] [place], [place] [address]    Report Status PENDING  Incomplete     Labs: BNP (last 3 results) Recent Labs    12/10/20 0300 07/06/21 2201  BNP 11.2 85.0   Basic Metabolic Panel: No results for input(s): NA, K, CL, CO2, GLUCOSE, BUN, CREATININE, CALCIUM, MG, PHOS in the last 168 hours. Liver Function  Tests: No results for input(s): AST, ALT, ALKPHOS, BILITOT, PROT, ALBUMIN in the last 168 hours. No results for input(s): LIPASE, AMYLASE in the last 168 hours. No results for input(s): AMMONIA in the last 168 hours. CBC: No results for input(s): WBC, NEUTROABS, HGB, HCT, MCV, PLT in the last 168 hours. Cardiac Enzymes: No results for input(s): CKTOTAL, CKMB, CKMBINDEX, TROPONINI in the last 168 hours. BNP: Invalid input(s): POCBNP CBG: Recent Labs  Lab 07/15/21 0626 07/15/21 1153 07/15/21 1657 07/15/21 2103 07/16/21 0605  GLUCAP 103* 136* 133* 122* 123*   D-Dimer No results for input(s): DDIMER in the last 72 hours. Hgb A1c No results for input(s): HGBA1C in the last 72 hours. Lipid Profile No results for input(s): CHOL, HDL, LDLCALC, TRIG, CHOLHDL, LDLDIRECT in the last 72 hours. Thyroid function studies No results for input(s): TSH, T4TOTAL, T3FREE, THYROIDAB in the last 72 hours.  Invalid input(s): FREET3 Anemia work up No results for input(s): VITAMINB12, FOLATE, FERRITIN, TIBC, IRON, RETICCTPCT in the last 72 hours. Urinalysis    Component Value Date/Time   COLORURINE YELLOW 07/14/2021 2021   APPEARANCEUR CLEAR 07/14/2021 2021   LABSPEC 1.013 07/14/2021 2021   PHURINE 5.0 07/14/2021 2021   GLUCOSEU NEGATIVE 07/14/2021 2021   HGBUR NEGATIVE 07/14/2021 2021   HGBUR negative 12/20/2007 1054   [place] NEGATIVE 07/14/2021 2021   BILIRUBINUR neg 02/24/2012 0905   KETONESUR NEGATIVE 07/14/2021 2021   PROTEINUR NEGATIVE 07/14/2021 2021   UROBILINOGEN 1.0 03/01/2012 1739   NITRITE NEGATIVE 07/14/2021 2021   LEUKOCYTESUR NEGATIVE 07/14/2021 2021   Sepsis Labs Invalid input(s): PROCALCITONIN,  WBC,  LACTICIDVEN Microbiology Recent Results (  from the past 240 hour(s))  Resp Panel by RT-PCR (Flu A&B, Covid) Nasopharyngeal Swab     Status: None   Collection Time: 07/06/21  6:36 PM   Specimen: Nasopharyngeal Swab; Nasopharyngeal(NP) swabs in vial transport medium   Result Value Ref Range Status   SARS Coronavirus 2 by RT PCR NEGATIVE NEGATIVE Final    Comment: (NOTE) SARS-CoV-2 target nucleic acids are NOT DETECTED.  The SARS-CoV-2 RNA is generally detectable in upper respiratory specimens during the acute phase of infection. The lowest concentration of SARS-CoV-2 viral copies this assay can detect is 138 copies/mL. A negative result does not preclude SARS-Cov-2 infection and should not be used as the sole basis for treatment or other patient management decisions. A negative result may occur with  improper specimen collection/handling, submission of specimen other than nasopharyngeal swab, presence of viral mutation(s) within the areas targeted by this assay, and inadequate number of viral copies(<138 copies/mL). A negative result must be combined with clinical observations, patient history, and epidemiological information. The expected result is Negative.  Fact Sheet for Patients:  EntrepreneurPulse.com.au  Fact Sheet for Healthcare Providers:  IncredibleEmployment.be  This test is no t yet approved or cleared by the [place] FDA and  has been authorized for detection and/or diagnosis of SARS-CoV-2 by FDA under an Emergency Use Authorization (EUA). This EUA will remain  in effect (meaning this test can be used) for the duration of the COVID-19 declaration under Section 564(b)(1) of the Act, 21 U.S.C.section 360bbb-3(b)(1), unless the authorization is terminated  or revoked sooner.       Influenza A by PCR NEGATIVE NEGATIVE Final   Influenza B by PCR NEGATIVE NEGATIVE Final    Comment: (NOTE) The Xpert Xpress SARS-CoV-2/FLU/RSV plus assay is intended as an aid in the diagnosis of influenza from Nasopharyngeal swab specimens and should not be used as a sole basis for treatment. Nasal washings and aspirates are unacceptable for Xpert Xpress SARS-CoV-2/FLU/RSV testing.  Fact Sheet for  Patients: EntrepreneurPulse.com.au  Fact Sheet for Healthcare Providers: IncredibleEmployment.be  This test is not yet approved or cleared by the [place] FDA and has been authorized for detection and/or diagnosis of SARS-CoV-2 by FDA under an Emergency Use Authorization (EUA). This EUA will remain in effect (meaning this test can be used) for the duration of the COVID-19 declaration under Section 564(b)(1) of the Act, 21 U.S.C. section 360bbb-3(b)(1), unless the authorization is terminated or revoked.  Performed at [facility], [place] [address] [place], [place] [address]   MRSA Next Gen by PCR, Nasal     Status: Abnormal   Collection Time: 07/08/21  6:35 PM   Specimen: Nasal Mucosa; Nasal Swab  Result Value Ref Range Status   MRSA by PCR Next Gen DETECTED (A) NOT DETECTED Final    Comment: RESULT CALLED TO, READ BACK BY AND VERIFIED WITH: PHYL,RN_0  07/08/21 [place] (NOTE) The GeneXpert MRSA Assay (FDA approved for NASAL specimens only), is one component of a comprehensive MRSA colonization surveillance program. It is not intended to diagnose MRSA infection nor to guide or monitor treatment for MRSA infections. Test performance is not FDA approved in patients less than 65 years old. Performed at [facility], [place] [address] [place], [place] [address]   Urine Culture     Status: Abnormal (Preliminary result)   Collection Time: 07/14/21  8:21 PM   Specimen: Urine, Clean Catch  Result Value Ref Range Status   Specimen Description URINE, CLEAN CATCH  Final  Special Requests NONE  Final   Culture (A)  Final    70,000 COLONIES/mL GRAM NEGATIVE RODS CULTURE REINCUBATED FOR BETTER GROWTH SUSCEPTIBILITIES TO FOLLOW Performed at [facility], [place] [address], [place], [place] [address]    Report Status PENDING  Incomplete     Time coordinating discharge: Over 30 minutes  SIGNED:   Eric J [place]  DO  [facility] 07/16/2021, 9:58 AM

## 2021-07-16 NOTE — Hospital Course (Signed)
[name] is a 61 year old female with past medical history significant for multiple sclerosis, essential hypertension, type 2 diabetes mellitus, obesity, hyperlipidemia who presented to [facility] ED on 2/19 following a fall, paresthesias to bilateral lower extremities, progressive weakness.  Onset 4 days prior with gradual progression.  CT head in the ED with no acute findings.  Concern for MS flare, although MRI with no active demyelination.  Neurology was consulted, [facility] consulted for further evaluation and management of progressive weakness concerning for MS flare. ? ?

## 2021-07-16 NOTE — Plan of Care (Signed)
  Problem: Education: Goal: Knowledge of General Education information will improve Description: Including pain rating scale, medication(s)/side effects and non-pharmacologic comfort measures Outcome: Adequate for Discharge   Problem: Health Behavior/Discharge Planning: Goal: Ability to manage health-related needs will improve Outcome: Adequate for Discharge   Problem: Clinical Measurements: Goal: Ability to maintain clinical measurements within normal limits will improve Outcome: Adequate for Discharge Goal: Will remain free from infection Outcome: Adequate for Discharge Goal: Diagnostic test results will improve Outcome: Adequate for Discharge Goal: Respiratory complications will improve Outcome: Adequate for Discharge Goal: Cardiovascular complication will be avoided Outcome: Adequate for Discharge   Problem: Activity: Goal: Risk for activity intolerance will decrease Outcome: Adequate for Discharge   Problem: Coping: Goal: Level of anxiety will decrease Outcome: Adequate for Discharge   Problem: Elimination: Goal: Will not experience complications related to bowel motility Outcome: Adequate for Discharge Goal: Will not experience complications related to urinary retention Outcome: Adequate for Discharge   Problem: Pain Managment: Goal: General experience of comfort will improve Outcome: Adequate for Discharge   Problem: Safety: Goal: Ability to remain free from injury will improve Outcome: Adequate for Discharge   Problem: Skin Integrity: Goal: Risk for impaired skin integrity will decrease Outcome: Adequate for Discharge   

## 2021-07-16 NOTE — Progress Notes (Signed)
PT Cancellation Note ? ?Patient Details ?Name: [name] ?MRN: [id number] ?DOB: 1960-06-01 ? ? ?Cancelled Treatment:    Reason Eval/Treat Not Completed: Patient declined, no reason specified.  Leaving soon,. ?07/16/2021 ? ?Jacinto Halim., PT ?Acute Rehabilitation Services ?[phone]  (pager) ?[phone]  (office) ? ? ?Eliseo Gum Dajah Fischman ?07/16/2021, 1:03 PM ?

## 2021-07-16 NOTE — Plan of Care (Signed)
  Problem: Pain Managment: Goal: General experience of comfort will improve Outcome: Progressing   Problem: Safety: Goal: Ability to remain free from injury will improve Outcome: Progressing   

## 2021-07-16 NOTE — Assessment & Plan Note (Signed)
Hemoglobin A1c 5.5 on 07/06/2021, well controlled.  Continue home Lantus 40 units obviously daily, Humalog 3-6 units 3 times daily AC. ?

## 2021-07-16 NOTE — Assessment & Plan Note (Addendum)
Continue Trileptal, baclofen, amitriptyline, Cymbalta, gabapentin ?

## 2021-07-16 NOTE — Assessment & Plan Note (Addendum)
Continue home Zanaflex, oxycodone, Trileptal, Cymbalta, baclofen, gabapentin. ?

## 2021-07-16 NOTE — Telephone Encounter (Signed)
Pt has called to report that she confirmed with [facility] that they received the e- scribed refill but there is a PA needed for [employer]. Please call. ?

## 2021-07-16 NOTE — Addendum Note (Signed)
Addended by: Wyvonnia Lora on: 07/16/2021 11:19 AM ? ? Modules accepted: Orders ? ?

## 2021-07-16 NOTE — Assessment & Plan Note (Signed)
Discussed with patient needs for aggressive lifestyle changes/weight loss as this complicates all facets of care.  Outpatient follow-up with PCP.  ?

## 2021-07-16 NOTE — Assessment & Plan Note (Signed)
Patient presenting to the ED with progressive weakness associated with paresthesias to bilateral lower extremities.  CT head without contrast unrevealing.  MRI brain, cervical/thoracic spine without evidence to suggest acute demyelination.  Neurology was consulted and followed during hospital course.  Even though imaging does not show any active demyelination, clinical picture with sensory deficits and neurology recommended 5-day course of IV steroids.  Patient completed 5-day course of IV steroids and was seen by PT/OT with recommendations of discharge to SNF.  Per neurology, may resume home Tecfidera on discharge.  Outpatient follow-up with neurology 2-4 weeks. ?

## 2021-07-16 NOTE — Telephone Encounter (Signed)
Called pt. Doing better, getting transferred to rehab this afternoon. Legs very weak still. She ran out of brand Tecfidera 07/12/21. Aware brand name denied/she must try/fail generic. She is agreeable to change to generic. I e-scribed rx to [facility]. She will f/u with them in about an hour to process/see what cost will be. I provided her their phone#[phone]. I did explain if med unaffordable, may have to discuss getting her changed over to [facility] at that point. She will call back if she has any further questions/concerns. ?

## 2021-07-16 NOTE — Assessment & Plan Note (Signed)
Continue amlodipine 10 mg p.o. daily, losartan 100 mg p.o. daily ?

## 2021-07-17 ENCOUNTER — Ambulatory Visit: Payer: Medicare Other

## 2021-07-17 LAB — URINE CULTURE: Culture: 70000 — AB

## 2021-07-29 ENCOUNTER — Emergency Department (HOSPITAL_COMMUNITY): Payer: Medicare Other

## 2021-07-29 ENCOUNTER — Encounter (HOSPITAL_COMMUNITY): Payer: Self-pay | Admitting: Pharmacy Technician

## 2021-07-29 ENCOUNTER — Other Ambulatory Visit: Payer: Self-pay

## 2021-07-29 ENCOUNTER — Observation Stay (HOSPITAL_COMMUNITY)
Admission: EM | Admit: 2021-07-29 | Discharge: 2021-08-01 | Disposition: A | Discharge status: Skilled Nursing Facility | Payer: Medicare Other | Attending: Internal Medicine | Admitting: Internal Medicine

## 2021-07-29 DIAGNOSIS — Z79899 Other long term (current) drug therapy: Secondary | ICD-10-CM | POA: Insufficient documentation

## 2021-07-29 DIAGNOSIS — R262 Difficulty in walking, not elsewhere classified: Secondary | ICD-10-CM | POA: Diagnosis present

## 2021-07-29 DIAGNOSIS — Z96652 Presence of left artificial knee joint: Secondary | ICD-10-CM | POA: Insufficient documentation

## 2021-07-29 DIAGNOSIS — F17210 Nicotine dependence, cigarettes, uncomplicated: Secondary | ICD-10-CM | POA: Insufficient documentation

## 2021-07-29 DIAGNOSIS — R269 Unspecified abnormalities of gait and mobility: Secondary | ICD-10-CM | POA: Diagnosis not present

## 2021-07-29 DIAGNOSIS — R531 Weakness: Secondary | ICD-10-CM | POA: Diagnosis present

## 2021-07-29 DIAGNOSIS — Z20822 Contact with and (suspected) exposure to covid-19: Secondary | ICD-10-CM | POA: Diagnosis not present

## 2021-07-29 DIAGNOSIS — E1121 Type 2 diabetes mellitus with diabetic nephropathy: Secondary | ICD-10-CM | POA: Diagnosis not present

## 2021-07-29 DIAGNOSIS — I10 Essential (primary) hypertension: Secondary | ICD-10-CM | POA: Diagnosis not present

## 2021-07-29 DIAGNOSIS — Z794 Long term (current) use of insulin: Secondary | ICD-10-CM | POA: Insufficient documentation

## 2021-07-29 DIAGNOSIS — G35 Multiple sclerosis: Secondary | ICD-10-CM | POA: Diagnosis not present

## 2021-07-29 LAB — CBC WITH DIFFERENTIAL/PLATELET
Abs Immature Granulocytes: 0.05 10*3/uL (ref 0.00–0.07)
Basophils Absolute: 0 10*3/uL (ref 0.0–0.1)
Basophils Relative: 0 %
Eosinophils Absolute: 0 10*3/uL (ref 0.0–0.5)
Eosinophils Relative: 0 %
HCT: 37.9 % (ref 36.0–46.0)
Hemoglobin: 12.9 g/dL (ref 12.0–15.0)
Immature Granulocytes: 1 %
Lymphocytes Relative: 19 %
Lymphs Abs: 1.9 10*3/uL (ref 0.7–4.0)
MCH: 31.2 pg (ref 26.0–34.0)
MCHC: 34 g/dL (ref 30.0–36.0)
MCV: 91.5 fL (ref 80.0–100.0)
Monocytes Absolute: 0.5 10*3/uL (ref 0.1–1.0)
Monocytes Relative: 5 %
Neutro Abs: 7.6 10*3/uL (ref 1.7–7.7)
Neutrophils Relative %: 75 %
Platelets: 276 10*3/uL (ref 150–400)
RBC: 4.14 MIL/uL (ref 3.87–5.11)
RDW: 12.6 % (ref 11.5–15.5)
WBC: 10.1 10*3/uL (ref 4.0–10.5)
nRBC: 0 % (ref 0.0–0.2)

## 2021-07-29 LAB — BASIC METABOLIC PANEL
Anion gap: 9 (ref 5–15)
BUN: 8 mg/dL (ref 8–23)
CO2: 28 mmol/L (ref 22–32)
Calcium: 9.5 mg/dL (ref 8.9–10.3)
Chloride: 106 mmol/L (ref 98–111)
Creatinine, Ser: 1.11 mg/dL — ABNORMAL HIGH (ref 0.44–1.00)
GFR, Estimated: 57 mL/min — ABNORMAL LOW (ref >60)
Glucose, Bld: 98 mg/dL (ref 70–99)
Potassium: 3 mmol/L — ABNORMAL LOW (ref 3.5–5.1)
Sodium: 143 mmol/L (ref 135–145)

## 2021-07-29 LAB — C-REACTIVE PROTEIN: CRP: 0.6 mg/dL (ref <1.0)

## 2021-07-29 LAB — RESP PANEL BY RT-PCR (FLU A&B, COVID) ARPGX2
Influenza A by PCR: NEGATIVE
Influenza B by PCR: NEGATIVE
SARS Coronavirus 2 by RT PCR: NEGATIVE

## 2021-07-29 LAB — GLUCOSE, CAPILLARY
Glucose-Capillary: 78 mg/dL (ref 70–99)
Glucose-Capillary: 141 mg/dL — ABNORMAL HIGH (ref 70–99)

## 2021-07-29 LAB — MAGNESIUM: Magnesium: 2.1 mg/dL (ref 1.7–2.4)

## 2021-07-29 LAB — SEDIMENTATION RATE: Sed Rate: 17 mm/hr (ref 0–22)

## 2021-07-29 MED ORDER — DIMETHYL FUMARATE 240 MG PO CPDR
240.0000 mg | DELAYED_RELEASE_CAPSULE | Freq: Two times a day (BID) | ORAL | Status: DC
  Administered 2021-08-01: 240 mg via ORAL

## 2021-07-29 MED ORDER — BUSPIRONE HCL 10 MG PO TABS
15.0000 mg | ORAL_TABLET | Freq: Two times a day (BID) | ORAL | Status: DC
  Administered 2021-07-29 – 2021-08-01 (×6): 15 mg via ORAL
  Filled 2021-07-29 (×6): qty 2

## 2021-07-29 MED ORDER — GABAPENTIN 400 MG PO CAPS
800.0000 mg | ORAL_CAPSULE | Freq: Two times a day (BID) | ORAL | Status: DC
Start: 2021-07-29 — End: 2021-08-01
  Administered 2021-07-29 – 2021-08-01 (×6): 800 mg via ORAL
  Filled 2021-07-29 (×6): qty 2

## 2021-07-29 MED ORDER — PANTOPRAZOLE SODIUM 40 MG PO TBEC
40.0000 mg | DELAYED_RELEASE_TABLET | Freq: Two times a day (BID) | ORAL | Status: DC
  Administered 2021-07-29 – 2021-08-01 (×6): 40 mg via ORAL
  Filled 2021-07-29 (×6): qty 1

## 2021-07-29 MED ORDER — VITAMIN B-12 1000 MCG PO TABS
1000.0000 ug | ORAL_TABLET | Freq: Every morning | ORAL | Status: DC
  Administered 2021-07-30 – 2021-08-01 (×3): 1000 ug via ORAL
  Filled 2021-07-29 (×3): qty 1

## 2021-07-29 MED ORDER — INSULIN GLARGINE-YFGN 100 UNIT/ML ~~LOC~~ SOLN
40.0000 [IU] | Freq: Every day | SUBCUTANEOUS | Status: DC
  Administered 2021-07-29 – 2021-07-31 (×3): 40 [IU] via SUBCUTANEOUS
  Filled 2021-07-29 (×4): qty 0.4

## 2021-07-29 MED ORDER — AMLODIPINE BESYLATE 10 MG PO TABS
10.0000 mg | ORAL_TABLET | Freq: Every day | ORAL | Status: DC
Start: 2021-07-29 — End: 2021-08-01
  Administered 2021-07-29 – 2021-08-01 (×4): 10 mg via ORAL
  Filled 2021-07-29 (×4): qty 1

## 2021-07-29 MED ORDER — OXCARBAZEPINE 300 MG PO TABS
300.0000 mg | ORAL_TABLET | Freq: Two times a day (BID) | ORAL | Status: DC
  Administered 2021-07-29 – 2021-08-01 (×6): 300 mg via ORAL
  Filled 2021-07-29 (×6): qty 1

## 2021-07-29 MED ORDER — ELUXADOLINE 100 MG PO TABS: 100.0000 mg | ORAL_TABLET | Freq: Two times a day (BID) | ORAL | Status: DC

## 2021-07-29 MED ORDER — OXYCODONE HCL 5 MG PO TABS
15.0000 mg | ORAL_TABLET | Freq: Four times a day (QID) | ORAL | Status: DC | PRN
  Administered 2021-07-29 – 2021-08-01 (×11): 15 mg via ORAL
  Filled 2021-07-29 (×11): qty 3

## 2021-07-29 MED ORDER — INSULIN ASPART 100 UNIT/ML IJ SOLN: 0.0000 [IU] | Freq: Every day | INTRAMUSCULAR | Status: DC

## 2021-07-29 MED ORDER — HYDROCODONE-ACETAMINOPHEN 5-325 MG PO TABS
1.0000 | ORAL_TABLET | Freq: Once | ORAL | Status: AC
  Administered 2021-07-29: 1 via ORAL
  Filled 2021-07-29: qty 1

## 2021-07-29 MED ORDER — DIPHENOXYLATE-ATROPINE 2.5-0.025 MG PO TABS
1.0000 | ORAL_TABLET | Freq: Two times a day (BID) | ORAL | Status: DC
  Administered 2021-07-29 – 2021-08-01 (×6): 1 via ORAL
  Filled 2021-07-29 (×6): qty 1

## 2021-07-29 MED ORDER — POTASSIUM CHLORIDE CRYS ER 20 MEQ PO TBCR
40.0000 meq | EXTENDED_RELEASE_TABLET | Freq: Once | ORAL | Status: AC
  Filled 2021-07-29: qty 2

## 2021-07-29 MED ORDER — AMANTADINE HCL 100 MG PO CAPS
100.0000 mg | ORAL_CAPSULE | Freq: Two times a day (BID) | ORAL | Status: DC
  Administered 2021-07-29 – 2021-08-01 (×6): 100 mg via ORAL
  Filled 2021-07-29 (×8): qty 1

## 2021-07-29 MED ORDER — IBUPROFEN 200 MG PO TABS
800.0000 mg | ORAL_TABLET | Freq: Three times a day (TID) | ORAL | Status: DC | PRN
  Administered 2021-07-29 – 2021-08-01 (×4): 800 mg via ORAL
  Filled 2021-07-29 (×4): qty 4

## 2021-07-29 MED ORDER — SODIUM CHLORIDE 0.9 % IV SOLN
500.0000 mg | Freq: Once | INTRAVENOUS | Status: AC
  Administered 2021-07-29: 500 mg via INTRAVENOUS
  Filled 2021-07-29: qty 4

## 2021-07-29 MED ORDER — FLUTICASONE PROPIONATE 50 MCG/ACT NA SUSP
2.0000 | Freq: Every day | NASAL | Status: DC
  Administered 2021-07-31 – 2021-08-01 (×2): 2 via NASAL
  Filled 2021-07-29 (×2): qty 16

## 2021-07-29 MED ORDER — INSULIN ASPART 100 UNIT/ML IJ SOLN
0.0000 [IU] | Freq: Three times a day (TID) | INTRAMUSCULAR | Status: DC
  Administered 2021-07-30 (×3): 4 [IU] via SUBCUTANEOUS
  Administered 2021-08-01: 3 [IU] via SUBCUTANEOUS

## 2021-07-29 MED ORDER — VITAMIN D 25 MCG (1000 UNIT) PO TABS
5000.0000 [IU] | ORAL_TABLET | Freq: Every morning | ORAL | Status: DC
  Administered 2021-07-30 – 2021-08-01 (×3): 5000 [IU] via ORAL
  Filled 2021-07-29 (×3): qty 5

## 2021-07-29 MED ORDER — HYDRALAZINE HCL 50 MG PO TABS
50.0000 mg | ORAL_TABLET | Freq: Three times a day (TID) | ORAL | Status: DC
  Administered 2021-07-29 – 2021-08-01 (×8): 50 mg via ORAL
  Filled 2021-07-29 (×8): qty 1

## 2021-07-29 MED ORDER — POTASSIUM CHLORIDE CRYS ER 20 MEQ PO TBCR
40.0000 meq | EXTENDED_RELEASE_TABLET | Freq: Once | ORAL | Status: AC
Start: 2021-07-29 — End: 2021-07-29
  Administered 2021-07-29: 40 meq via ORAL

## 2021-07-29 MED ORDER — ENOXAPARIN SODIUM 40 MG/0.4ML IJ SOSY
40.0000 mg | PREFILLED_SYRINGE | INTRAMUSCULAR | Status: DC
  Administered 2021-07-29 – 2021-07-30 (×2): 40 mg via SUBCUTANEOUS
  Filled 2021-07-29 (×2): qty 0.4

## 2021-07-29 MED ORDER — DULOXETINE HCL 60 MG PO CPEP
60.0000 mg | ORAL_CAPSULE | Freq: Two times a day (BID) | ORAL | Status: DC
Start: 2021-07-29 — End: 2021-08-01
  Administered 2021-07-29 – 2021-08-01 (×6): 60 mg via ORAL
  Filled 2021-07-29 (×6): qty 1

## 2021-07-29 MED ORDER — ATORVASTATIN CALCIUM 80 MG PO TABS
80.0000 mg | ORAL_TABLET | Freq: Every evening | ORAL | Status: DC
  Administered 2021-07-30 – 2021-07-31 (×2): 80 mg via ORAL
  Filled 2021-07-29 (×3): qty 1

## 2021-07-29 MED ORDER — AMITRIPTYLINE HCL 25 MG PO TABS
25.0000 mg | ORAL_TABLET | Freq: Every day | ORAL | Status: DC
  Administered 2021-07-29 – 2021-07-31 (×3): 25 mg via ORAL
  Filled 2021-07-29 (×4): qty 1

## 2021-07-29 MED ORDER — TIZANIDINE HCL 4 MG PO TABS
4.0000 mg | ORAL_TABLET | Freq: Two times a day (BID) | ORAL | Status: DC
  Administered 2021-07-29 – 2021-08-01 (×6): 4 mg via ORAL
  Filled 2021-07-29 (×6): qty 1

## 2021-07-29 MED ORDER — CYCLOBENZAPRINE HCL 10 MG PO TABS
5.0000 mg | ORAL_TABLET | Freq: Once | ORAL | Status: AC
  Administered 2021-07-29: 5 mg via ORAL
  Filled 2021-07-29: qty 1

## 2021-07-29 MED ORDER — LOPERAMIDE HCL 2 MG PO CAPS
2.0000 mg | ORAL_CAPSULE | Freq: Two times a day (BID) | ORAL | Status: DC
  Administered 2021-07-29 – 2021-08-01 (×6): 2 mg via ORAL
  Filled 2021-07-29 (×6): qty 1

## 2021-07-29 MED ORDER — LAMOTRIGINE 100 MG PO TABS
200.0000 mg | ORAL_TABLET | Freq: Every day | ORAL | Status: DC
  Administered 2021-07-30 – 2021-08-01 (×3): 200 mg via ORAL
  Filled 2021-07-29 (×3): qty 2

## 2021-07-29 MED ORDER — BACLOFEN 10 MG PO TABS
20.0000 mg | ORAL_TABLET | Freq: Two times a day (BID) | ORAL | Status: DC
  Administered 2021-07-29 – 2021-08-01 (×6): 20 mg via ORAL
  Filled 2021-07-29 (×5): qty 2
  Filled 2021-07-29: qty 1
  Filled 2021-07-29: qty 2

## 2021-07-29 MED ORDER — LIFITEGRAST 5 % OP SOLN
1.0000 [drp] | Freq: Two times a day (BID) | OPHTHALMIC | Status: DC
Start: 2021-07-29 — End: 2021-08-01

## 2021-07-29 MED ORDER — ZOLPIDEM TARTRATE 5 MG PO TABS
5.0000 mg | ORAL_TABLET | Freq: Every day | ORAL | Status: DC
  Administered 2021-07-29 – 2021-07-31 (×3): 5 mg via ORAL
  Filled 2021-07-29 (×3): qty 1

## 2021-07-29 MED ORDER — INSULIN GLARGINE 100 UNIT/ML SOLOSTAR PEN: 40.0000 [IU] | PEN_INJECTOR | Freq: Every day | SUBCUTANEOUS | Status: DC

## 2021-07-29 MED ORDER — LOSARTAN POTASSIUM 50 MG PO TABS
100.0000 mg | ORAL_TABLET | Freq: Every day | ORAL | Status: DC
  Administered 2021-07-30 – 2021-08-01 (×3): 100 mg via ORAL
  Filled 2021-07-29 (×4): qty 2

## 2021-07-29 MED ORDER — MAGNESIUM OXIDE -MG SUPPLEMENT 400 (240 MG) MG PO TABS
400.0000 mg | ORAL_TABLET | Freq: Every day | ORAL | Status: DC
  Administered 2021-07-30 – 2021-08-01 (×3): 400 mg via ORAL
  Filled 2021-07-29 (×4): qty 1

## 2021-07-29 MED ORDER — MONTELUKAST SODIUM 10 MG PO TABS
10.0000 mg | ORAL_TABLET | Freq: Every day | ORAL | Status: DC
  Administered 2021-07-29 – 2021-07-31 (×3): 10 mg via ORAL
  Filled 2021-07-29 (×4): qty 1

## 2021-07-29 NOTE — ED Provider Notes (Signed)
?[facility] ?Provider Note ? ? ?CSN: [id number] ?Arrival date & time: 07/29/21  3016 ? ?  ? ?History ? ?Chief Complaint  ?Patient presents with  ? Fall  ? Weakness  ? Knee Pain  ? ? ?[name] is a 61 y.o. female. ? ? ?Fall ? ?Weakness ?Knee Pain ? ?61 year old female with a history of multiple sclerosis, recent hospitalization for presumed MS flare status post IV steroids who presents to the emergency department with bilateral lower extremity weakness and radicular pain in addition to increasing falls at home.  The patient was seen in the hospital and evaluated for similar symptoms last month and had negative MRI imaging but was treated for presumed MS flare given her symptoms.  She was then discharged to a rehab facility but was unable to stay for more than 4 days due to insurance problems.  On review, the patient was hospitalized for similar presentation with bilateral lower extremity weakness, numbness and radicular pain.  MRI of the thoracic and lumbar spine at that time it showed chronic lesions compatible with chronic MS.  She states that her pain has worsened over the last 3 days and she did fall onto her lower back yesterday.  She also complains of urinary incontinence.  She denies any fevers or chills.  She states most of her pain is radicular in nature. ? ?Home Medications ?Prior to Admission medications   ?Medication Sig Start Date End Date Taking? Authorizing Provider  ?Accu-Chek Softclix Lancets lancets Use as directed up to 4 times daily 12/18/20   Mariel Aloe, MD  ?amantadine (SYMMETREL) 100 MG capsule Take 1 capsule (100 mg total) by mouth 2 (two) times daily. 06/12/21   Sater, Nanine Means, MD  ?amitriptyline (ELAVIL) 25 MG tablet TAKE 1 TABLET(25 MG) BY MOUTH AT BEDTIME 03/06/21   Sater, Nanine Means, MD  ?amLODipine (NORVASC) 10 MG tablet Take 1 tablet (10 mg total) by mouth daily. 07/16/21   [place] Eric J, DO  ?ANTI-DIARRHEAL 2 MG tablet Take 2 mg by mouth in  the morning and at bedtime. 07/31/19   [provider]  ?atorvastatin (LIPITOR) 80 MG tablet Take 1 tablet (80 mg total) by mouth daily. ?Patient taking differently: Take 80 mg by mouth every evening. 02/24/12   Copland, Gay Filler, MD  ?baclofen (LIORESAL) 20 MG tablet Take 20 mg by mouth 2 (two) times daily. 06/27/21   [provider]  ?Biotin w/ Vitamins C & E (HAIR/SKIN/NAILS PO) Take 3 tablets by mouth in the morning and at bedtime.    [provider]  ?Blood Glucose Monitoring Suppl (BLOOD GLUCOSE MONITOR SYSTEM) w/Device KIT use as directed 12/18/20   Mariel Aloe, MD  ?busPIRone (BUSPAR) 15 MG tablet Take 15 mg by mouth 2 (two) times daily.    [provider]  ?Cholecalciferol (VITAMIN D-3) 125 MCG (5000 UT) TABS Take 5,000 Units by mouth in the morning.    [provider]  ?Dimethyl Fumarate (TECFIDERA) 240 MG CPDR Take 1 capsule (240 mg total) by mouth 2 (two) times daily. 07/16/21   Sater, Nanine Means, MD  ?diphenoxylate-atropine (LOMOTIL) 2.5-0.025 MG tablet Take 1 tablet by mouth in the morning and at bedtime.    [provider]  ?DULoxetine (CYMBALTA) 60 MG capsule Take 60 mg by mouth 2 (two) times daily.  10/04/19   [provider]  ?fluticasone (FLONASE) 50 MCG/ACT nasal spray Place 2 sprays into the nose daily. ?Patient taking differently: Place 2  sprays into the nose in the morning and at bedtime. 02/24/12   Copland, Gay Filler, MD  ?gabapentin (NEURONTIN) 400 MG capsule Take 2 capsules (800 mg total) by mouth 2 (two) times daily. 07/16/21   [place] Donnamarie Poag, DO  ?glucose blood (ACCU-CHEK GUIDE) test strip Use as instructed up to 4 times daily 12/18/20   Mariel Aloe, MD  ?Homeopathic Products (LEG CRAMPS PO) Place 3 tablets under the tongue daily as needed (For leg cramps).    [provider]  ?hydrALAZINE (APRESOLINE) 50 MG tablet Take 1 tablet (50 mg total) by mouth every 8 (eight) hours. 07/16/21   [place] Donnamarie Poag, DO  ?ibuprofen (ADVIL)  800 MG tablet Take 800 mg by mouth 3 (three) times daily as needed for moderate pain.  09/25/19   [provider]  ?insulin glargine (LANTUS) 100 UNIT/ML Solostar Pen Inject 40 Units into the skin daily. 12/18/20   Mariel Aloe, MD  ?insulin lispro (HUMALOG) 100 UNIT/ML KwikPen Inject 10 Units into the skin 3 (three) times daily with meals. ?Patient taking differently: Inject 3-6 Units into the skin 3 (three) times daily with meals. Sliding scale insulin 12/18/20   Mariel Aloe, MD  ?Insulin Pen Needle 32G X 4 MM MISC use as directed 4 times daily 12/18/20   Mariel Aloe, MD  ?lamoTRIgine (LAMICTAL) 200 MG tablet TAKE 1 TABLET BY MOUTH TWICE DAILY 05/22/21   Sater, Nanine Means, MD  ?losartan (COZAAR) 100 MG tablet Take 1 tablet (100 mg total) by mouth daily. 02/24/12   Copland, Gay Filler, MD  ?MAGNESIUM-OXIDE 400 (240 Mg) MG tablet Take 1 tablet by mouth daily. 03/29/21   [provider]  ?MELATONIN MAXIMUM STRENGTH 5 MG TABS Take 5 mg by mouth at bedtime. 07/31/19   [provider]  ?montelukast (SINGULAIR) 10 MG tablet Take 10 mg by mouth at bedtime. 07/31/19   [provider]  ?NARCAN 4 MG/0.1ML LIQD nasal spray kit Place 1 spray into the nose as directed. overdose 10/11/19   [provider]  ?Oxcarbazepine (TRILEPTAL) 300 MG tablet Take 1 tablet (300 mg total) by mouth 2 (two) times daily. 06/26/21   Sater, Nanine Means, MD  ?oxyCODONE (ROXICODONE) 15 MG immediate release tablet Take 1 tablet (15 mg total) by mouth every 6 (six) hours as needed for pain. 07/16/21   [place] Donnamarie Poag, DO  ?pantoprazole (PROTONIX) 40 MG tablet Take 40 mg by mouth 2 (two) times daily. 06/23/21   [provider]  ?tiZANidine (ZANAFLEX) 2 MG tablet Take 4 mg by mouth in the morning and at bedtime. 10/23/19   [provider]  ?VIBERZI 100 MG TABS Take 100 mg by mouth 2 (two) times daily. 11/07/19   [provider]  ?vitamin B-12 (CYANOCOBALAMIN) 1000 MCG tablet Take 1,000 mcg by  mouth in the morning. 07/31/19   [provider]  ?Shirley Friar 5 % SOLN Place 1 drop into both eyes in the morning and at bedtime. 11/10/19   [provider]  ?zolpidem (AMBIEN) 10 MG tablet Take 1 tablet (10 mg total) by mouth at bedtime. 07/16/21   [place] Eric J, DO  ?   ? ?Allergies    ?Patient has no known allergies.   ? ?Review of Systems   ?Review of Systems  ?Neurological:  Positive for weakness.  ?All other systems reviewed and are negative. ? ?Physical Exam ?Updated Vital Signs ?BP (!) 141/91 (BP Location: Left Arm)   Pulse 90   Temp 99.2 ?  F (37.3 ?C) (Oral)   Resp 18   Ht _0  (1.6 m)   Wt 117.4 kg   SpO2 100%   BMI 45.85 kg/m?  ?Physical Exam ?Vitals and nursing note reviewed.  ?Constitutional:   ?   General: She is not in acute distress. ?   Appearance: She is well-developed.  ?HENT:  ?   Head: Normocephalic and atraumatic.  ?Eyes:  ?   Conjunctiva/sclera: Conjunctivae normal.  ?Cardiovascular:  ?   Rate and Rhythm: Normal rate and regular rhythm.  ?   Heart sounds: No murmur heard. ?Pulmonary:  ?   Effort: Pulmonary effort is normal. No respiratory distress.  ?   Breath sounds: Normal breath sounds.  ?Abdominal:  ?   Palpations: Abdomen is soft.  ?   Tenderness: There is no abdominal tenderness.  ?Musculoskeletal:     ?   General: No swelling.  ?   Cervical back: Neck supple.  ?   Comments: Positive straight leg raise test bilaterally  ?Skin: ?   General: Skin is warm and dry.  ?   Capillary Refill: Capillary refill takes less than 2 seconds.  ?Neurological:  ?   Mental Status: She is alert.  ?   Comments: No cranial nerve deficit.  5 out of 5 strength in the bilateral upper extremities, 4 out of 5 strength in the bilateral lower extremities.  ?Psychiatric:     ?   Mood and Affect: Mood normal.  ? ? ?ED Results / Procedures / Treatments   ?Labs ?(all labs ordered are listed, but only abnormal results are displayed) ?Labs Reviewed  ?BASIC METABOLIC PANEL - Abnormal; Notable for the  following components:  ?    Result Value  ? Potassium 3.0 (*)   ? Creatinine, Ser 1.11 (*)   ? GFR, Estimated 57 (*)   ? All other components within normal limits  ?RESP PANEL BY RT-PCR (FLU A&B, COVID) ARPGX2  ?

## 2021-07-29 NOTE — H&P (Signed)
?History and Physical  ? ? ?[name]:[id number] DOB: [birth year] DOA: 07/29/2021 ? ?PCP: Beverley Fiedler, FNP (Confirm with patient/family/NH records and if not entered, this has to be entered at [facility] point of entry) ?Patient coming from: Home ? ?I have personally briefly reviewed patient's old medical records in [place] ? ?Chief Complaint: B/L leg weakness, numbness and pain ? ?HPI: [name] is a 61 y.o. female with medical history significant of MS diagnosed in 2008 on Tecfidera, chronic ambulation dysfunction, diabetic neuropathy, HTN, IDDM, morbid obesity, presented with worsening of bilateral lower extremity weakness, pain and numbness. ? ?Patient was recently hospitalized for similar presentation when the patient had bilateral lower extremity below the knee weakness, numbness and pain.  MRI of thoracic and lumbar spine showed chronic T6 lesions compatible with chronic MS. patient was treated with high-dose of steroid for a putative MS flareup and patient was discharged to rehab.  Today, patient came back with new onset of bilateral lower extremities numbness weakness and severe pain x3 days with frequent falls.  She also complains about having urinary incontinence, but denies any dysuria, no fever or chills, no back pains. ? ?ED Course: No tachycardia no hypotension afebrile, blood work showed K3.0, normal WBC.  Normal ESR and CRP.  Lumbar spine CT showed no fracture or dislocation. ? ?Review of Systems: As per HPI otherwise 14 point review of systems negative.  ? ? ?Past Medical History:  ?Diagnosis Date  ? Diabetes mellitus   ? Hypertension   ? Multiple sclerosis exacerbation ([place])   ? Neuropathy   ? ? ?Past Surgical History:  ?Procedure Laterality Date  ? ABDOMINAL HYSTERECTOMY    ? partial  ? I & D EXTREMITY Right 12/14/2020  ? Procedure: IRRIGATION AND DEBRIDEMENT RIGHT LOWER EXTREMITY;  Surgeon: Rod Can, MD;  Location: [facility];  Service: Orthopedics;  Laterality: Right;  ?  JOINT REPLACEMENT    ? LTK  ? RADIOLOGY WITH ANESTHESIA N/A 02/18/2021  ? Procedure: MRI WITH ANESTHESIA CERVICAL SPINE WITH AND WITHOUT CONTRAST AND BRAIN WITH AND WITHOUT CONTRAST;  Surgeon: Radiologist, Medication, MD;  Location: [facility];  Service: Radiology;  Laterality: N/A;  ? RADIOLOGY WITH ANESTHESIA N/A 07/07/2021  ? Procedure: MRI WITH ANESTHESIA;  Surgeon: Radiologist, Medication, MD;  Location: [facility];  Service: Radiology;  Laterality: N/A;  ? SACRAL NERVE STIMULATOR PLACEMENT    ? Axonics  ? TUBAL LIGATION    ? ? ? reports that she has been smoking cigarettes. She has a 3.00 pack-year smoking history. She has never used smokeless tobacco. She reports that she does not drink alcohol and does not use drugs. ? ?No Known Allergies ? ?Family History  ?Problem Relation Age of Onset  ? Diabetes Mother   ? Hypertension Mother   ? Stroke Father   ? ? ? ?Prior to Admission medications   ?Medication Sig Start Date End Date Taking? Authorizing Provider  ?Accu-Chek Softclix Lancets lancets Use as directed up to 4 times daily 12/18/20   Mariel Aloe, MD  ?amantadine (SYMMETREL) 100 MG capsule Take 1 capsule (100 mg total) by mouth 2 (two) times daily. 06/12/21   Sater, Nanine Means, MD  ?amitriptyline (ELAVIL) 25 MG tablet TAKE 1 TABLET(25 MG) BY MOUTH AT BEDTIME 03/06/21   Sater, Nanine Means, MD  ?amLODipine (NORVASC) 10 MG tablet Take 1 tablet (10 mg total) by mouth daily. 07/16/21   [place] Eric J, DO  ?ANTI-DIARRHEAL 2 MG tablet Take 2 mg by  mouth in the morning and at bedtime. 07/31/19   [provider]  ?atorvastatin (LIPITOR) 80 MG tablet Take 1 tablet (80 mg total) by mouth daily. ?Patient taking differently: Take 80 mg by mouth every evening. 02/24/12   Copland, Gay Filler, MD  ?baclofen (LIORESAL) 20 MG tablet Take 20 mg by mouth 2 (two) times daily. 06/27/21   [provider]  ?Biotin w/ Vitamins C & E (HAIR/SKIN/NAILS PO) Take 3 tablets by mouth in the morning and at bedtime.    [provider]  ?Blood Glucose Monitoring Suppl (BLOOD GLUCOSE MONITOR SYSTEM) w/Device KIT use as directed 12/18/20   Mariel Aloe, MD  ?busPIRone (BUSPAR) 15 MG tablet Take 15 mg by mouth 2 (two) times daily.    [provider]  ?Cholecalciferol (VITAMIN D-3) 125 MCG (5000 UT) TABS Take 5,000 Units by mouth in the morning.    [provider]  ?Dimethyl Fumarate (TECFIDERA) 240 MG CPDR Take 1 capsule (240 mg total) by mouth 2 (two) times daily. 07/16/21   Sater, Nanine Means, MD  ?diphenoxylate-atropine (LOMOTIL) 2.5-0.025 MG tablet Take 1 tablet by mouth in the morning and at bedtime.    [provider]  ?DULoxetine (CYMBALTA) 60 MG capsule Take 60 mg by mouth 2 (two) times daily.  10/04/19   [provider]  ?fluticasone (FLONASE) 50 MCG/ACT nasal spray Place 2 sprays into the nose daily. ?Patient taking differently: Place 2 sprays into the nose in the morning and at bedtime. 02/24/12   Copland, Gay Filler, MD  ?gabapentin (NEURONTIN) 400 MG capsule Take 2 capsules (800 mg total) by mouth 2 (two) times daily. 07/16/21   [place] Donnamarie Poag, DO  ?glucose blood (ACCU-CHEK GUIDE) test strip Use as instructed up to 4 times daily 12/18/20   Mariel Aloe, MD  ?Homeopathic Products (LEG CRAMPS PO) Place 3 tablets under the tongue daily as needed (For leg cramps).    [provider]  ?hydrALAZINE (APRESOLINE) 50 MG tablet Take 1 tablet (50 mg total) by mouth every 8 (eight) hours. 07/16/21   [place] Donnamarie Poag, DO  ?ibuprofen (ADVIL) 800 MG tablet Take 800 mg by mouth 3 (three) times daily as needed for moderate pain.  09/25/19   [provider]  ?insulin glargine (LANTUS) 100 UNIT/ML Solostar Pen Inject 40 Units into the skin daily. 12/18/20   Mariel Aloe, MD  ?insulin lispro (HUMALOG) 100 UNIT/ML KwikPen Inject 10 Units into the skin 3 (three) times daily with meals. ?Patient taking differently: Inject 3-6 Units into the skin 3 (three) times daily with meals. Sliding scale insulin 12/18/20    Mariel Aloe, MD  ?Insulin Pen Needle 32G X 4 MM MISC use as directed 4 times daily 12/18/20   Mariel Aloe, MD  ?lamoTRIgine (LAMICTAL) 200 MG tablet TAKE 1 TABLET BY MOUTH TWICE DAILY 05/22/21   Sater, Nanine Means, MD  ?losartan (COZAAR) 100 MG tablet Take 1 tablet (100 mg total) by mouth daily. 02/24/12   Copland, Gay Filler, MD  ?MAGNESIUM-OXIDE 400 (240 Mg) MG tablet Take 1 tablet by mouth daily. 03/29/21   [provider]  ?MELATONIN MAXIMUM STRENGTH 5 MG TABS Take 5 mg by mouth at bedtime. 07/31/19   [provider]  ?montelukast (SINGULAIR) 10 MG tablet Take 10 mg by mouth at bedtime. 07/31/19   [provider]  ?NARCAN 4 MG/0.1ML LIQD nasal spray kit Place 1 spray into the nose as directed. overdose 10/11/19   [provider]  ?  Oxcarbazepine (TRILEPTAL) 300 MG tablet Take 1 tablet (300 mg total) by mouth 2 (two) times daily. 06/26/21   Sater, Nanine Means, MD  ?oxyCODONE (ROXICODONE) 15 MG immediate release tablet Take 1 tablet (15 mg total) by mouth every 6 (six) hours as needed for pain. 07/16/21   [place] Donnamarie Poag, DO  ?pantoprazole (PROTONIX) 40 MG tablet Take 40 mg by mouth 2 (two) times daily. 06/23/21   [provider]  ?tiZANidine (ZANAFLEX) 2 MG tablet Take 4 mg by mouth in the morning and at bedtime. 10/23/19   [provider]  ?VIBERZI 100 MG TABS Take 100 mg by mouth 2 (two) times daily. 11/07/19   [provider]  ?vitamin B-12 (CYANOCOBALAMIN) 1000 MCG tablet Take 1,000 mcg by mouth in the morning. 07/31/19   [provider]  ?Shirley Friar 5 % SOLN Place 1 drop into both eyes in the morning and at bedtime. 11/10/19   [provider]  ?zolpidem (AMBIEN) 10 MG tablet Take 1 tablet (10 mg total) by mouth at bedtime. 07/16/21   [place] Eric J, DO  ? ? ?Physical Exam: ?Vitals:  ? 07/29/21 0942 07/29/21 1053 07/29/21 1215  ?BP: 128/61 123/73 (!) 177/96  ?Pulse: 99 97 96  ?Resp: 17  18  ?Temp: 98.6 ?F (37 ?C)    ?SpO2: 94% 92% 93%   ? ? ?Constitutional: NAD, calm, comfortable ?Vitals:  ? 07/29/21 9030 07/29/21 1053 07/29/21 1215  ?BP: 128/61 123/73 (!) 177/96  ?Pulse: 99 97 96  ?Resp: 17  18  ?Temp: 98.6 ?F (37 ?C)    ?SpO2: 94% 92% 93%  ? ?Eyes: P

## 2021-07-29 NOTE — Progress Notes (Signed)
[name]  ?Code Status: FULL ?[name] is a 61 y.o. female patient admitted from ED awake, alert - oriented X4 - no acute distress noted. VSS -  no c/o shortness of breath, no c/o chest pain. Fall assessment complete, with patient able to verbalize understanding of risk associated with falls, and verbalized understanding to call nursing before up out of bed. Call light within reach, patient able to voice, and demonstrate understanding. Skin, clean and dry, abrasions to bilateral knees. ?No evidence of skin break down noted on exam.  ??  ?Will cont to eval and treat per MD orders.  ?Jon Gills, RN  ?07/29/2021 7:33 PM    ?

## 2021-07-29 NOTE — Consult Note (Signed)
NEUROLOGY CONSULTATION NOTE  ? ?Date of service: July 29, 2021 ?Patient Name: [name] ?MRN:  [id number] ?DOB:  Mar 22, 1961 ?Reason for consult: "falls, MS flare" ?Requesting Provider: Emeline General, MD ? ?History of Present Illness  ?[name] is a 61 year old female with a past medical history of multiple sclerosis on Tecfidera.  She was recently hospitalized for suspected MS flare on 2/19.  MRI showed no evidence of active demyelination at the patient reported focal symptoms and was treated with IV steroids for 5 days.  She was then discharged to a SNF. ? ?The patient reports that she was only able to stay at the SNF for 4 days, because of problems with her insurance.  She states that she then went to live with her daughter "for convenience".  During her time at her daughter's home, the patient reports about 7-8 significant falls.  She attributes this to poor lower extremity strength.  She reports numbness and tingling of her lower extremities that started 1 week ago.  She also complains of blurry vision and headache which began yesterday.  She feels that this is similar to her previous MS flares. ?  ?ROS  ? ?Constitutional Denies weight loss, fever and chills.   ?HEENT Denies changes in hearing.   ?Respiratory Denies SOB and cough.   ?CV Denies palpitations and CP   ?GI Denies abdominal pain, nausea, vomiting and diarrhea.   ?GU Denies dysuria and urinary frequency.   ?MSK Denies myalgia and joint pain.   ?Skin Denies rash and pruritus.   ?Neurological Denies syncope.   ?Psychiatric   ? ?Past History  ? ?Past Medical History:  ?Diagnosis Date  ? Diabetes mellitus   ? Hypertension   ? Multiple sclerosis exacerbation (HCC)   ? Neuropathy   ? ?Past Surgical History:  ?Procedure Laterality Date  ? ABDOMINAL HYSTERECTOMY    ? partial  ? I & D EXTREMITY Right 12/14/2020  ? Procedure: IRRIGATION AND DEBRIDEMENT RIGHT LOWER EXTREMITY;  Surgeon: Samson Frederic, MD;  Location: [facility] OR;  Service: Orthopedics;   Laterality: Right;  ? JOINT REPLACEMENT    ? LTK  ? RADIOLOGY WITH ANESTHESIA N/A 02/18/2021  ? Procedure: MRI WITH ANESTHESIA CERVICAL SPINE WITH AND WITHOUT CONTRAST AND BRAIN WITH AND WITHOUT CONTRAST;  Surgeon: Radiologist, Medication, MD;  Location: [facility] OR;  Service: Radiology;  Laterality: N/A;  ? RADIOLOGY WITH ANESTHESIA N/A 07/07/2021  ? Procedure: MRI WITH ANESTHESIA;  Surgeon: Radiologist, Medication, MD;  Location: [facility] OR;  Service: Radiology;  Laterality: N/A;  ? SACRAL NERVE STIMULATOR PLACEMENT    ? Axonics  ? TUBAL LIGATION    ? ?Family History  ?Problem Relation Age of Onset  ? Diabetes Mother   ? Hypertension Mother   ? Stroke Father   ? ?Social History  ? ?Socioeconomic History  ? Marital status: Single  ?  Spouse name: Not on file  ? Number of children: 2  ? Years of education: Not on file  ? Highest education level: Not on file  ?Occupational History  ? Not on file  ?Tobacco Use  ? Smoking status: Every Day  ?  Packs/day: 0.25  ?  Years: 12.00  ?  Pack years: 3.00  ?  Types: Cigarettes  ? Smokeless tobacco: Never  ? Tobacco comments:  ?  06/13/20 1 pk per 3 days  ?Substance and Sexual Activity  ? Alcohol use: No  ? Drug use: No  ? Sexual activity: Not on file  ?Other Topics  Concern  ? Not on file  ?Social History Narrative  ? 06/13/20 lives alone, dgtr across the street  ? ?Social Determinants of Health  ? ?Financial Resource Strain: Not on file  ?Food Insecurity: Not on file  ?Transportation Needs: Not on file  ?Physical Activity: Not on file  ?Stress: Not on file  ?Social Connections: Not on file  ? ?No Known Allergies ? ?Medications  ?(Not in a hospital admission) ?  ? ?Vitals  ? ?Vitals:  ? 07/29/21 9702 07/29/21 1053 07/29/21 1215  ?BP: 128/61 123/73 (!) 177/96  ?Pulse: 99 97 96  ?Resp: 17  18  ?Temp: 98.6 ?F (37 ?C)    ?SpO2: 94% 92% 93%  ?  ? ?There is no height or weight on file to calculate BMI. ? ?Physical Exam  ? ?General: Lying comfortably in bed; in no acute distress.  ?HENT: Normal  oropharynx and mucosa. Normal external appearance of ears and nose.  ?Neck: Supple, no pain or tenderness  ?CV: No JVD. No peripheral edema.  ?Pulmonary: Symmetric Chest rise. Normal respiratory effort.  ?Abdomen: Soft to touch, non-tender.  ?Ext: No cyanosis, edema, or deformity  ?Skin: No rash. Normal palpation of skin.   ?Musculoskeletal: Normal digits and nails by inspection. No clubbing.  ? ?Neurologic Examination  ?Mental status/Cognition: Alert, oriented to self, place, month and year, good attention.  ?Speech/language: Fluent, comprehension intact. ?Cranial nerves:  ? CN II Pupils equal and reactive to light, no VF deficits   ? CN III,IV,VI EOM intact, no gaze preference or deviation, no nystagmus   ? CN V normal sensation in V1, V2, and V3 segments bilaterally   ? CN VII no asymmetry, no nasolabial fold flattening   ? CN VIII normal hearing to speech   ? CN IX & X normal palatal elevation, no uvular deviation   ? CN XI 5/5 head turn and 5/5 shoulder shrug bilaterally   ? CN XII midline tongue protrusion   ? ?Motor:  ? ?Mvmt Root Nerve  Muscle Right Left Comments  ?SA C5/6 Ax Deltoid 5/5 5/5   ?EF C5/6 Mc Biceps 5/5 5/5   ?EE C6/7/8 Rad Triceps 5/5 5/5   ?WF C6/7 Med FCR     ?WE C7/8 PIN ECU     ?F Ab C8/T1 U ADM/FDI     ?HF L1/2/3 Fem Illopsoas 2/5 2/5   ?KE L2/3/4 Fem Quad 2/5 2/5   ?DF L4/5 D Peron Tib Ant     ?PF S1/2 Tibial Grc/Sol     ? ?Reflexes: ? Right Left Comments  ?Pectoralis     ? Biceps (C5/6) 0 0   ?Brachioradialis (C5/6)     ? Triceps (C6/7)     ? Patellar (L3/4) 0 0   ? Achilles (S1) 1+ 1+   ? Hoffman     ? Plantar     ?Jaw jerk   ? ?Sensation: ? Light touch Diminished up to the mid-tibia  ? Pin prick   ? Temperature   ? Vibration   ?Proprioception   ? ?Coordination/Complex Motor:  ?- Finger to Nose intact ?- Heel to shin unable to perform ?- Rapid alternating movement intact ?- Gait: deferred ? ?Labs  ? ?CBC:  ?Recent Labs  ?Lab 07/29/21 ?1145  ?WBC 10.1  ?NEUTROABS 7.6  ?HGB 12.9  ?HCT  37.9  ?MCV 91.5  ?PLT 276  ? ? ?Basic Metabolic Panel:  ?Lab Results  ?Component Value Date  ? NA 143 07/29/2021  ? K 3.0 (L)  07/29/2021  ? CO2 28 07/29/2021  ? GLUCOSE 98 07/29/2021  ? BUN 8 07/29/2021  ? CREATININE 1.11 (H) 07/29/2021  ? CALCIUM 9.5 07/29/2021  ? GFRNONAA 57 (L) 07/29/2021  ? GFRAA >60 11/17/2019  ? ?Lipid Panel:  ?Lab Results  ?Component Value Date  ? LDLCALC (H) 02/19/2009  ?  135        ?Total Cholesterol/HDL:CHD Risk ?Coronary Heart Disease Risk Table ?                    Men   Women ? 1/2 Average Risk   3.4   3.3 ? Average Risk       5.0   4.4 ? 2 X Average Risk   9.6   7.1 ? 3 X Average Risk  23.4   11.0 ?       ?Use the calculated Patient Ratio ?above and the CHD Risk Table ?to determine the patient's CHD Risk. ?       ?ATP III CLASSIFICATION (LDL): ? <100     mg/dL   Optimal ? 161-096  mg/dL   Near or Above ?                   Optimal ? 130-159  mg/dL   Borderline ? 160-189  mg/dL   High ? >045     mg/dL   Very High  ? ?HgbA1c:  ?Lab Results  ?Component Value Date  ? HGBA1C 5.5 07/06/2021  ? ?Urine Drug Screen:  ?   ?Component Value Date/Time  ? LABOPIA NONE DETECTED 07/06/2021 1640  ? COCAINSCRNUR NONE DETECTED 07/06/2021 1640  ? LABBENZ NONE DETECTED 07/06/2021 1640  ? AMPHETMU NONE DETECTED 07/06/2021 1640  ? THCU NONE DETECTED 07/06/2021 1640  ? LABBARB NONE DETECTED 07/06/2021 1640  ?  ?Alcohol Level  ?   ?Component Value Date/Time  ? ETH <10 12/10/2020 0300  ? ? ?CT-Lumbar spine: ?IMPRESSION: ?1.  No evidence of acute fracture. ?  ?2. Advanced multilevel degenerate disc disease with disc height loss ?and facet joint arthropathy prominent at L4-L5 with moderate ?bilateral neural foraminal narrowing. Encroachment of bilateral L5 ?nerve roots in the subarticular recesses. ?  ?3. Spinal canal narrowing at L3-L4 and L2-L3 with encroachment of ?the descending nerve roots. ? ?Impression  ? ?[name] is a 61 year old female with a past medical history of multiple sclerosis on Tecfidera.   She was recently hospitalized for suspected MS flare on 2/19.  MRI showed no evidence of active demyelination at the patient reported focal symptoms and was treated with IV steroids for 5 days.  She was then disc

## 2021-07-29 NOTE — ED Triage Notes (Signed)
Pt bib ems from home with reports of increased falls, bil weakness and bil knee pain from the falls. Pt seen last month for the same and was sent to a rehab. Pt only able to stay 4 days in rehab due to insurance. Pt called PCP yesterday and scheduled for pulmonary test. VSS with ems.  ?

## 2021-07-30 DIAGNOSIS — R262 Difficulty in walking, not elsewhere classified: Secondary | ICD-10-CM | POA: Diagnosis not present

## 2021-07-30 DIAGNOSIS — G35 Multiple sclerosis: Secondary | ICD-10-CM | POA: Diagnosis not present

## 2021-07-30 LAB — GLUCOSE, CAPILLARY
Glucose-Capillary: 167 mg/dL — ABNORMAL HIGH (ref 70–99)
Glucose-Capillary: 172 mg/dL — ABNORMAL HIGH (ref 70–99)
Glucose-Capillary: 155 mg/dL — ABNORMAL HIGH (ref 70–99)
Glucose-Capillary: 189 mg/dL — ABNORMAL HIGH (ref 70–99)

## 2021-07-30 NOTE — Evaluation (Signed)
Occupational Therapy Evaluation Patient Details Name: [name] MRN: [id number] DOB: [birth year] Today's Date: 07/30/2021   History of Present Illness [name] is a 61 yo female who presented with generalized weakness, bilateral leg numbness tingling and falls over last couple days. Pending MRI work up to confirm MS flare. Medical history significant of  MS, HTN , HLD   Clinical Impression   Pt admitted for concerns listed above. PTA pt reported that she was independent with ADL's and functional mobility, using a Rollator. At this time, pt presents with increased pain in BLE and weakness, which affects her activity tolerance and balance. She is requiring min -mod A with LB ADL's and functional mobility at this time. Pt is very motivated to continue working towards independence and increasing her strength to reduce falls. Recommending AIR to maximize her independence and safety prior to returning home. OT will follow acutely.        Recommendations for follow up therapy are one component of a multi-disciplinary discharge planning process, led by the attending physician.  Recommendations may be updated based on patient status, additional functional criteria and insurance authorization.   Follow Up Recommendations  Acute inpatient rehab (3hours/day)    Assistance Recommended at Discharge Frequent or constant Supervision/Assistance  Patient can return home with the following A lot of help with walking and/or transfers;A lot of help with bathing/dressing/bathroom;Assistance with cooking/housework;Help with stairs or ramp for entrance    Functional Status Assessment  Patient has had a recent decline in their functional status and demonstrates the ability to make significant improvements in function in a reasonable and predictable amount of time.  Equipment Recommendations  None recommended by OT    Recommendations for Other Services Rehab consult     Precautions / Restrictions  Precautions Precautions: Fall Restrictions Weight Bearing Restrictions: No      Mobility Bed Mobility Overal bed mobility: Modified Independent             General bed mobility comments: Increased time to sit, use of bed rails    Transfers Overall transfer level: Needs assistance Equipment used: Rolling walker (2 wheels) Transfers: Sit to/from Stand Sit to Stand: Min assist           General transfer comment: Pt needing min A to power up, BLE buckling/shaking with standing      Balance Overall balance assessment: Needs assistance Sitting-balance support: No upper extremity supported Sitting balance-Leahy Scale: Good     Standing balance support: Bilateral upper extremity supported Standing balance-Leahy Scale: Poor Standing balance comment: heavy use of RW                           ADL either performed or assessed with clinical judgement   ADL Overall ADL's : Needs assistance/impaired Eating/Feeding: Independent;Sitting   Grooming: Modified independent;Sitting   Upper Body Bathing: Min guard;Sitting   Lower Body Bathing: Moderate assistance;Sitting/lateral leans;Sit to/from stand   Upper Body Dressing : Min guard;Sitting   Lower Body Dressing: Maximal assistance;Sitting/lateral leans;Sit to/from stand   Toilet Transfer: Minimal assistance;Stand-pivot   Toileting- Clothing Manipulation and Hygiene: Moderate assistance;Sitting/lateral lean;Sit to/from stand       Functional mobility during ADLs: Minimal assistance;Rolling walker (2 wheels) General ADL Comments: Pt limited due to pain, weakness, balance deficits and decreased activity tolerance.     Vision Baseline Vision/History: 1 Wears glasses Ability to See in Adequate Light: 1 Impaired Patient Visual Report: Blurring of vision Vision Assessment?:  No apparent visual deficits Additional Comments: Blurring of vision/intermittent diplopia is her baseline from MS symptoms      Perception     Praxis      Pertinent Vitals/Pain Pain Assessment Pain Assessment: Faces Faces Pain Scale: Hurts even more Pain Location: b/l feet and calves Pain Descriptors / Indicators: Burning, Constant, Discomfort, Grimacing Pain Intervention(s): Limited activity within patient's tolerance, Monitored during session, Repositioned     Hand Dominance Right   Extremity/Trunk Assessment Upper Extremity Assessment Upper Extremity Assessment: Overall WFL for tasks assessed (Pt does have tremor present, howver at this time does not limit her motor abilities)   Lower Extremity Assessment Lower Extremity Assessment: Defer to PT evaluation   Cervical / Trunk Assessment Cervical / Trunk Assessment: Normal   Communication Communication Communication: No difficulties   Cognition Arousal/Alertness: Awake/alert Behavior During Therapy: WFL for tasks assessed/performed Overall Cognitive Status: Within Functional Limits for tasks assessed                                       General Comments  VSS on RA    Exercises     Shoulder Instructions      Home Living Family/patient expects to be discharged to:: Private residence Living Arrangements: Children Available Help at Discharge: Family Type of Home: House Home Access: Stairs to enter [employer] of Steps: 2 at the gate, 1 at the house Entrance Stairs-Rails: None Home Layout: One level     Bathroom Shower/Tub: Chief Strategy Officer: Standard     Home Equipment: Rollator (4 wheels);Shower seat;Electric scooter;Cane - single point   Additional Comments: Lives with daughter      Prior Functioning/Environment Prior Level of Function : Needs assist             Mobility Comments: Pt uses a rollator, has had multiple falls, requiring EMT to come and assist her up. ADLs Comments: Reports overall independence        OT Problem List: Decreased strength;Decreased range of  motion;Decreased activity tolerance;Impaired balance (sitting and/or standing);Pain;Decreased safety awareness;Decreased knowledge of use of DME or AE;Decreased knowledge of precautions;Impaired sensation      OT Treatment/Interventions: Self-care/ADL training;Therapeutic exercise;DME and/or AE instruction;Therapeutic activities;Patient/family education;Balance training    OT Goals(Current goals can be found in the care plan section) Acute Rehab OT Goals Patient Stated Goal: To get stronger OT Goal Formulation: With patient Time For Goal Achievement: 08/13/21 Potential to Achieve Goals: Good ADL Goals Pt Will Perform Lower Body Bathing: with min assist;with adaptive equipment;sitting/lateral leans;sit to/from stand Pt Will Perform Lower Body Dressing: with min assist;with adaptive equipment;sitting/lateral leans;sit to/from stand Pt Will Transfer to Toilet: with min guard assist;ambulating Pt Will Perform Toileting - Clothing Manipulation and hygiene: with min guard assist;with adaptive equipment;sitting/lateral leans;sit to/from stand Additional ADL Goal #1: Pt will tolerate standing ADL task for 5 mins with min guard.  OT Frequency: Min 2X/week    Co-evaluation              AM-PAC OT "6 Clicks" Daily Activity     Outcome Measure Help from another person eating meals?: None Help from another person taking care of personal grooming?: A Little Help from another person toileting, which includes using toliet, bedpan, or urinal?: A Little Help from another person bathing (including washing, rinsing, drying)?: A Lot Help from another person to put on and taking off regular upper body clothing?:  A Little Help from another person to put on and taking off regular lower body clothing?: A Lot 6 Click Score: 17   End of Session Equipment Utilized During Treatment: Rolling walker (2 wheels) Nurse Communication: Mobility status  Activity Tolerance: Patient tolerated treatment well Patient  left: with call bell/phone within reach (Pt sitting EOB to eat lunch)  OT Visit Diagnosis: Other abnormalities of gait and mobility (R26.89);Unsteadiness on feet (R26.81);Repeated falls (R29.6);Muscle weakness (generalized) (M62.81);History of falling (Z91.81);Pain                Time: 1610-9604 OT Time Calculation (min): 24 min Charges:  OT General Charges $OT Visit: 1 Visit OT Evaluation $OT Eval Moderate Complexity: 1 Mod OT Treatments $Therapeutic Activity: 8-22 mins  Manuelita Moxon H., OTR/L Acute Rehabilitation  Fatih Stalvey Elane Cheryn Lundquist 07/30/2021, 1:02 PM

## 2021-07-30 NOTE — Care Management Obs Status (Signed)
MEDICARE OBSERVATION STATUS NOTIFICATION ? ? ?Patient Details  ?Name: [name] ?MRN: [id number] ?Date of Birth: [birth year] ? ? ?Medicare Observation Status Notification Given:  Yes ? ? ? ?Baldemar Lenis, LCSW ?07/30/2021, 3:19 PM ?

## 2021-07-30 NOTE — Progress Notes (Signed)
NEUROLOGY CONSULTATION PROGRESS NOTE  ? ?Date of service: July 30, 2021 ?Patient Name: [name] ?MRN:  [id number] ?DOB:  1960-05-26 ? ?Brief HPI  ?[name] is a 61 year old female with a past medical history of multiple sclerosis on Tecfidera.  She was recently hospitalized for suspected MS flare on 2/19.  MRI showed no evidence of active demyelination at the patient reported focal symptoms and was treated with IV steroids for 5 days.  She was then discharged to a SNF. ?  ?The patient reports that she was only able to stay at the SNF for 4 days, because of problems with her insurance.  She states that she then went to live with her daughter "for convenience".  During her time at her daughter's home, the patient reports about 7-8 significant falls.  She attributes this to poor lower extremity strength.  She reports numbness and tingling of her lower extremities that started 1 week ago.  She also complains of blurry vision and headache which began yesterday.  She feels that this is similar to her previous MS flares. ?  ?Interval Hx  ? ?Patient complaining of hand shaking on assessment this morning. Denies other complaints and is hopeful regarding working with PT and SNF placement.  ? ?Vitals  ? ?Vitals:  ? 07/29/21 2020 07/29/21 2338 07/30/21 0310 07/30/21 0917  ?BP:  134/72 123/73 131/62  ?Pulse:  91 95 (!) 106  ?Resp:  19 19 18   ?Temp:  98.1 ?F (36.7 ?C) (!) 97.3 ?F (36.3 ?C) 98.9 ?F (37.2 ?C)  ?TempSrc:  Oral Oral Oral  ?SpO2:  90% 90% 93%  ?Weight: 117.4 kg     ?Height: 5\' 3"  (1.6 m)     ?  ? ?Body mass index is 45.85 kg/m?. ? ?Physical Exam  ? ?General: Lying comfortably in bed; in no acute distress.  ?HENT: Normal oropharynx and mucosa. Normal external appearance of ears and nose.  ?Neck: Supple, no pain or tenderness  ?CV: No peripheral edema.  ?Pulmonary: Symmetric Chest rise. Normal respiratory effort.  ?Ext: No cyanosis, edema, or deformity  ?Skin: No rash. Normal palpation of skin. Some abrasions  on knees.  ? ?Neurologic Examination  ?Mental status/Cognition: Alert, oriented to self, place, month and year, good attention.  ?Speech/language: Fluent, comprehension intact, object naming intact, repetition intact.  ?Cranial nerves:  ? CN II Pupils equal and reactive to light, no VF deficits   ? CN III,IV,VI EOM intact, no gaze preference or deviation, no nystagmus   ? CN V normal sensation in V1, V2, and V3 segments bilaterally   ? CN VII no asymmetry, no nasolabial fold flattening   ? CN VIII normal hearing to speech   ? CN IX & X normal palatal elevation, no uvular deviation   ? CN XI 5/5 head turn and 5/5 shoulder shrug bilaterally   ? CN XII midline tongue protrusion   ? ?Motor:  ?High amplitude tremor of the bilateral hands, does not continue when patient is distracted. ?Mvmt Root Nerve  Muscle Right Left Comments  ?SA C5/6 Ax Deltoid 5/5 5/5   ?EF C5/6 Mc Biceps     ?EE C6/7/8 Rad Triceps     ?WF C6/7 Med FCR     ?WE C7/8 PIN ECU     ?F Ab C8/T1 U ADM/FDI     ?HF L1/2/3 Fem Illopsoas 3/5 3/5   ?KE L2/3/4 Fem Quad     ?DF L4/5 D Peron Tib Ant     ?PF  S1/2 Tibial Grc/Sol     ? ?Reflexes: ? Right Left Comments  ?Pectoralis     ? Biceps (C5/6)     ?Brachioradialis (C5/6)     ? Triceps (C6/7)     ? Patellar (L3/4)     ? Achilles (S1)     ? Hoffman     ? Plantar     ?Jaw jerk   ? ?Sensation: ? Light touch Diminished up to the thigh  ? Pin prick   ? Temperature   ? Vibration   ?Proprioception   ? ?Coordination/Complex Motor:  ?- Finger to Nose intact ?- Rapid alternating movement intact ?- Gait: deferred ? ?Labs  ? ?Basic Metabolic Panel:  ?Lab Results  ?Component Value Date  ? NA 143 07/29/2021  ? K 3.0 (L) 07/29/2021  ? CO2 28 07/29/2021  ? GLUCOSE 98 07/29/2021  ? BUN 8 07/29/2021  ? CREATININE 1.11 (H) 07/29/2021  ? CALCIUM 9.5 07/29/2021  ? GFRNONAA 57 (L) 07/29/2021  ? GFRAA >60 11/17/2019  ? ?HbA1c:  ?Lab Results  ?Component Value Date  ? HGBA1C 5.5 07/06/2021  ? ?LDL:  ?Lab Results  ?Component Value Date   ? LDLCALC (H) 02/19/2009  ?  135        ?Total Cholesterol/HDL:CHD Risk ?Coronary Heart Disease Risk Table ?                    Men   Women ? 1/2 Average Risk   3.4   3.3 ? Average Risk       5.0   4.4 ? 2 X Average Risk   9.6   7.1 ? 3 X Average Risk  23.4   11.0 ?       ?Use the calculated Patient Ratio ?above and the CHD Risk Table ?to determine the patient's CHD Risk. ?       ?ATP III CLASSIFICATION (LDL): ? <100     mg/dL   Optimal ? 244-010  mg/dL   Near or Above ?                   Optimal ? 130-159  mg/dL   Borderline ? 160-189  mg/dL   High ? >272     mg/dL   Very High  ? ?Urine Drug Screen:  ?   ?Component Value Date/Time  ? LABOPIA NONE DETECTED 07/06/2021 1640  ? COCAINSCRNUR NONE DETECTED 07/06/2021 1640  ? LABBENZ NONE DETECTED 07/06/2021 1640  ? AMPHETMU NONE DETECTED 07/06/2021 1640  ? THCU NONE DETECTED 07/06/2021 1640  ? LABBARB NONE DETECTED 07/06/2021 1640  ?  ?Alcohol Level  ?   ?Component Value Date/Time  ? ETH <10 12/10/2020 0300  ? ?No results found for: PHENYTOIN, ZONISAMIDE, LAMOTRIGINE, LEVETIRACETA ?No results found for: PHENYTOIN, PHENOBARB, VALPROATE, CBMZ ? ?Imaging and Diagnostic studies  ?  ?CT-Lumbar spine: ?IMPRESSION: ?1.  No evidence of acute fracture. ?  ?2. Advanced multilevel degenerate disc disease with disc height loss ?and facet joint arthropathy prominent at L4-L5 with moderate ?bilateral neural foraminal narrowing. Encroachment of bilateral L5 ?nerve roots in the subarticular recesses. ?  ?3. Spinal canal narrowing at L3-L4 and L2-L3 with encroachment of ?the descending nerve roots. ? ? ?Impression  ? ?[name] is a 62 year old female with a past medical history of multiple sclerosis on Tecfidera.  She was recently hospitalized for suspected MS flare on 2/19.  MRI showed no evidence of active demyelination at the patient reported focal symptoms and  was treated with IV steroids for 5 days.  She was then discharged to a SNF. ? ?Patient with either acute MS flair or  functional neurological symptoms. Patient required general anesthesia for MRI last time and imaging will not change management (patient recently received a course of steroids and therefore can only receive one more dose). She received her dose of Solumedrol yesterday evening without complication and exhibits improved strength this morning.  ? ?Recommendations  ? ?-PT ?-SNF placement ?-Recommend outpatient psychotherapy as patient reports psychosocial stressors and this may be contributing to her presentation ? ?Neurology will sign off at this time. ? ? ?Carlyn Reichert, MD ?PGY-1 ? ? ?I have seen the patient and reviewed the above note.  She has a clearly distractible tremor on exam.  Though there is some question about whether steroids could be contributing to an enhanced physiological tremor, I think there may be some psychological overlay associated with her presentation.  She does have significant MS, however, and I think that she would benefit from PT/OT and possible short-term rehab. ? ?No further recommendations at this time, care is supportive from this point out, please call with further questions or concerns. ? ?Ritta Slot, MD ?[facility] ?[phone] ? ?If 7pm- 7am, please page neurology on call as listed in AMION. ? ?

## 2021-07-30 NOTE — Evaluation (Signed)
Physical Therapy Evaluation ?Patient Details ?Name: [name] ?MRN: [id number] ?DOB: January 29, 1961 ?Today's Date: 07/30/2021 ? ?History of Present Illness ? Pt is a 61 y/o female who presented with generalized weakness, bilateral leg numbness tingling and falls over last couple days. Recent admission for MS flare with d/c from SNF after 4 days 2? "insurance issues". Pt admitted for progressing weakness following MS flare. Medical history significant of  MS, HTN, DM, neuropathy, fibromyalgia (per pt report). ?  ?Clinical Impression ? Pt admitted with above diagnosis. Pt currently with functional limitations due to the deficits listed below (see PT Problem List). At the time of PT eval pt was able to perform transfers with heavy min assist and heavy reliance on the RW for support. Pt limited by bilateral foot pain (likely 2? neuropathy), bilateral knee buckling, and tremors. She had a recent admission for MS flare and was prematurely discharged from SNF 2? an insurance mix up per pt report. She is motivated to participate with therapies and is eager to return to P[facility]F. Pt asking about inpatient rehab and explained typical criteria for admission. Reached out to admissions coordinators to discuss pt case. Feel she has good rehab potential and would thrive in the fast pace AIR environment compared to SNF. Acutely, pt will benefit from skilled PT to increase their independence and safety with mobility to allow discharge to the venue listed below.      ?   ? ?Recommendations for follow up therapy are one component of a multi-disciplinary discharge planning process, led by the attending physician.  Recommendations may be updated based on patient status, additional functional criteria and insurance authorization. ? ?Follow Up Recommendations Acute inpatient rehab (3hours/day) ? ?  ?Assistance Recommended at Discharge Intermittent Supervision/Assistance  ?Patient can return home with the following ? A little help with  bathing/dressing/bathroom;Assistance with cooking/housework;Help with stairs or ramp for entrance;Assist for transportation ? ?  ?Equipment Recommendations None recommended by PT  ?Recommendations for Other Services ? Rehab consult  ?  ?Functional Status Assessment Patient has had a recent decline in their functional status and demonstrates the ability to make significant improvements in function in a reasonable and predictable amount of time.  ? ?  ?Precautions / Restrictions Precautions ?Precautions: Fall ?Restrictions ?Weight Bearing Restrictions: No  ? ?  ? ?Mobility ? Bed Mobility ?Overal bed mobility: Needs Assistance ?Bed Mobility: Supine to Sit, Sit to Supine, Rolling ?Rolling: Modified independent (Device/Increase time) ?  ?Supine to sit: Min assist ?Sit to supine: Min assist ?  ?General bed mobility comments: Assist for trunk elevation to full sitting position, and at end of session LE's assisted back up into bed. ?  ? ?Transfers ?Overall transfer level: Needs assistance ?Equipment used: Rolling walker (2 wheels) ?Transfers: Sit to/from Stand, Bed to chair/wheelchair/BSC ?Sit to Stand: Min assist ?  ?Step pivot transfers: Min assist ?  ?  ?  ?General transfer comment: Assist to power up to full stand. Pt with heavy reliance on RW and noted B knee buckling. Pt took 3-4 side steps at EOB up towards [facility] with assist for balance and walker management. ?  ? ?Ambulation/Gait ?  ?  ?  ?  ?  ?  ?  ?General Gait Details: Unable to progress to gait training at this time. ? ?Stairs ?  ?  ?  ?  ?  ? ?Wheelchair Mobility ?  ? ?Modified Rankin (Stroke Patients Only) ?  ? ?  ? ?Balance Overall balance assessment: Needs assistance ?Sitting-balance support:  No upper extremity supported ?Sitting balance-Leahy Scale: Fair ?  ?  ?Standing balance support: Bilateral upper extremity supported ?Standing balance-Leahy Scale: Poor ?Standing balance comment: heavy use of RW ?  ?  ?  ?  ?  ?  ?  ?  ?  ?  ?  ?   ? ? ? ?Pertinent  Vitals/Pain Pain Assessment ?Pain Assessment: Faces ?Faces Pain Scale: Hurts even more ?Breathing: normal ?Negative Vocalization: none ?Facial Expression: smiling or inexpressive ?Body Language: relaxed ?Consolability: no need to console ?PAINAD Score: 0 ?Pain Location: bilateral feet ?Pain Descriptors / Indicators: Burning, Constant, Discomfort, Grimacing ?Pain Intervention(s): Limited activity within patient's tolerance, Monitored during session, Repositioned  ? ? ?Home Living Family/patient expects to be discharged to:: Private residence ?Living Arrangements: Children ?Available Help at Discharge: Family ?Type of Home: House ?Home Access: Stairs to enter ?Entrance Stairs-Rails: None ?Entrance Stairs-Number of Steps: 2 at the gate, 1 at the house ?  ?Home Layout: One level ?Home Equipment: Rollator (4 wheels);Shower seat;Electric scooter;Cane - single point ?Additional Comments: Lives with daughter  ?  ?Prior Function Prior Level of Function : Needs assist ?  ?  ?  ?  ?  ?  ?Mobility Comments: Pt uses a rollator, has had multiple falls, requiring EMT to come and assist her up. ?ADLs Comments: Reports overall independence ?  ? ? ?Hand Dominance  ? Dominant Hand: Right ? ?  ?Extremity/Trunk Assessment  ? Upper Extremity Assessment ?Upper Extremity Assessment: Overall WFL for tasks assessed (Noted bilateral tremor. MD present to assess during session - reports likely 2? steriods.) ?  ? ?Lower Extremity Assessment ?Lower Extremity Assessment: RLE deficits/detail;LLE deficits/detail ?RLE Deficits / Details: Bilateral feet sensitive to touch and weight bearing. Pt unable to achieve full knee extension bilaterally for MMT and pt with jerkiness noted with attempts to MMT hamstrings and hip flexors. ?  ? ?Cervical / Trunk Assessment ?Cervical / Trunk Assessment: Normal  ?Communication  ? Communication: No difficulties  ?Cognition Arousal/Alertness: Awake/alert ?Behavior During Therapy: [facility] for tasks  assessed/performed ?Overall Cognitive Status: Within Functional Limits for tasks assessed ?  ?  ?  ?  ?  ?  ?  ?  ?  ?  ?  ?  ?  ?  ?  ?  ?  ?  ?  ? ?  ?General Comments General comments (skin integrity, edema, etc.): VSS on RA ? ?  ?Exercises    ? ?Assessment/Plan  ?  ?PT Assessment Patient needs continued PT services  ?PT Problem List Decreased activity tolerance;Decreased range of motion;Decreased strength;Decreased balance;Decreased mobility ? ?   ?  ?PT Treatment Interventions DME instruction;Gait training;Stair training;Functional mobility training;Therapeutic activities;Therapeutic exercise;Balance training;Neuromuscular re-education   ? ?PT Goals (Current goals can be found in the Care Plan section)  ?Acute Rehab PT Goals ?PT Goal Formulation: With patient ?Time For Goal Achievement: 08/13/21 ?Potential to Achieve Goals: Good ? ?  ?Frequency Min 3X/week ?  ? ? ?Co-evaluation   ?  ?  ?  ?  ? ? ?  ?AM-PAC PT "6 Clicks" Mobility  ?Outcome Measure Help needed turning from your back to your side while in a flat bed without using bedrails?: A Little ?Help needed moving from lying on your back to sitting on the side of a flat bed without using bedrails?: A Little ?Help needed moving to and from a bed to a chair (including a wheelchair)?: A Little ?Help needed standing up from a chair using your arms (e.g., wheelchair or  bedside chair)?: A Little ?Help needed to walk in hospital room?: Total ?Help needed climbing 3-5 steps with a railing? : Total ?6 Click Score: 14 ? ?  ?End of Session Equipment Utilized During Treatment: Gait belt ?Activity Tolerance: Patient limited by fatigue;Patient limited by pain ?Patient left: in chair;with call bell/phone within reach;with chair alarm set;with nursing/sitter in room ?Nurse Communication: Mobility status ?PT Visit Diagnosis: Unsteadiness on feet (R26.81);Muscle weakness (generalized) (M62.81);Difficulty in walking, not elsewhere classified (R26.2) ?  ? ?Time: [id number] ?PT  Time Calculation (min) (ACUTE ONLY): 27 min ? ? ?Charges:   PT Evaluation ?$PT Eval Moderate Complexity: 1 Mod ?PT Treatments ?$Therapeutic Activity: 8-22 mins ?  ?   ? ? ?Rolinda Roan, PT, DPT ?Acute Rehabilitation Services ?Pager: [phone]

## 2021-07-30 NOTE — [place] FL2 (Signed)
?[place] MEDICAID FL2 LEVEL OF CARE SCREENING TOOL  ?  ? ?IDENTIFICATION  ?Patient Name: ?[name] Birthdate: 03/16/1961 Sex: female Admission Date (Bright Location): ?07/29/2021  ?[place] and [place] Number: ? [place] ?  Facility and Address:  ?[facility], [place] [address] [place], [place] [address] ?     Provider Number: ?[id number]  ?Attending Physician Name and Address:  ?Barb Merino, MD ? Relative Name and Phone Number:  ?  ?   ?Bright Level of Care: ?Hospital Recommended Level of Care: ?[place] Prior Approval Number: ?  ? ?Date Approved/Denied: ?  PASRR Number: ?[id number] ? ?Discharge Plan: ?SNF ?  ? ?Bright Diagnoses: ?Patient Active Problem List  ? Diagnosis Date Noted  ? Impaired ambulation 07/29/2021  ? UTI (urinary tract infection) 07/16/2021  ? Chronic pain 07/06/2021  ? Dysesthesia 06/12/2021  ? Pain in both lower extremities 01/14/2021  ? Acute metabolic encephalopathy [id number]  ? Hyperosmolar non-ketotic state due to type 2 diabetes mellitus ([place]) 12/10/2020  ? Leg wound, right, initial encounter 12/10/2020  ? Hyperglycemia 12/10/2020  ? Essential tremor 07/15/2020  ? Gait disturbance 06/13/2020  ? High risk medication use 12/08/2019  ? Left carpal tunnel syndrome 08/19/2012  ? Depression with anxiety 08/19/2012  ? Lumbar spondylosis 03/07/2012  ? Osteoarthrosis, unspecified whether generalized or localized, lower leg 03/07/2012  ? Diabetic peripheral neuropathy ([place]) 03/07/2012  ? SMOKER 06/02/2010  ? SIMPLE CHRONIC BRONCHITIS 06/02/2010  ? DYSPNEA 06/02/2010  ? SKIN RASH 08/05/2009  ? ACUTE CYSTITIS 05/03/2009  ? DM (diabetes mellitus), type 2 with complications ([place]) [id number]  ? INSOMNIA 02/12/2009  ? Obesity, Class III, BMI 40-49.9 (morbid obesity) ([place]) 11/28/2008  ? LIVER FUNCTION TESTS, ABNORMAL, HX OF 11/28/2008  ? DEGENERATION INTERVERTEBRAL DISC SITE UNSPEC 07/02/2008  ? Other chronic pain 05/22/2008  ? BLURRED VISION 05/22/2008  ?  ESOPHAGEAL REFLUX 01/10/2008  ? Multiple sclerosis ([place]) 12/09/2007  ? URINARY RETENTION 12/09/2007  ? TACHYCARDIA 11/17/2007  ? Essential hypertension 11/08/2007  ? ? ?Orientation RESPIRATION BLADDER Height & Weight   ?  ?Self, Time, Situation, Place ? Normal Continent Weight: 258 lb 13.1 oz (117.4 kg) ?Height:  5\' 3"  (160 cm)  ?BEHAVIORAL SYMPTOMS/MOOD NEUROLOGICAL BOWEL NUTRITION STATUS  ?    Continent Diet (heart healthy)  ?AMBULATORY STATUS COMMUNICATION OF NEEDS Skin   ?Extensive Assist Verbally Skin abrasions (left knee, foam dressing, change MWF) ?  ?  ?  ?    ?     ?     ? ? ?Personal Care Assistance Level of Assistance  ?Bathing, Feeding, Dressing Bathing Assistance: Limited assistance ?Feeding assistance: Independent ?Dressing Assistance: Limited assistance ?   ? ?Functional Limitations Info  ?    ?  ?   ? ? ?SPECIAL CARE FACTORS FREQUENCY  ?PT (By licensed PT), OT (By licensed OT)   ?  ?PT Frequency: 5x/wk ?OT Frequency: 5x/wk ?  ?  ?  ?   ? ? ?Contractures Contractures Info: Not present  ? ? ?Additional Factors Info  ?Code Status, Allergies, Psychotropic Code Status Info: Full ?Allergies Info: NKA ?Psychotropic Info: Buspar 15mg  2x/day; Cymbalta 60mg  2x/day; Lamictal 200mg  daily; Trileptal 300mg  2x/day ?Insulin Sliding Scale Info: see DC summary ?  ?   ? ?Bright Medications (07/30/2021):  This is the Bright hospital active medication list ?Bright Facility-Administered Medications  ?Medication Dose Route Frequency Provider Last Rate Last Admin  ? amantadine (SYMMETREL) capsule 100 mg  100 mg Oral BID Wynetta Fines T,  MD   100 mg at 07/30/21 0850  ? amitriptyline (ELAVIL) tablet 25 mg  25 mg Oral QHS Lequita Halt, MD   25 mg at 07/29/21 2127  ? amLODipine (NORVASC) tablet 10 mg  10 mg Oral Daily Wynetta Fines T, MD   10 mg at 07/30/21 0851  ? atorvastatin (LIPITOR) tablet 80 mg  80 mg Oral QPM Wynetta Fines T, MD      ? baclofen (LIORESAL) tablet 20 mg  20 mg Oral BID Wynetta Fines T, MD   20 mg at 07/30/21  0850  ? busPIRone (BUSPAR) tablet 15 mg  15 mg Oral BID Wynetta Fines T, MD   15 mg at 07/30/21 0851  ? cholecalciferol (VITAMIN D3) tablet 5,000 Units  5,000 Units Oral q AM Lequita Halt, MD   5,000 Units at 07/30/21 0603  ? Dimethyl Fumarate CPDR 240 mg  240 mg Oral BID Wynetta Fines T, MD      ? diphenoxylate-atropine (LOMOTIL) 2.5-0.025 MG per tablet 1 tablet  1 tablet Oral BID Lequita Halt, MD   1 tablet at 07/30/21 [phone]  ? DULoxetine (CYMBALTA) DR capsule 60 mg  60 mg Oral BID Lequita Halt, MD   60 mg at 07/30/21 0850  ? Eluxadoline TABS 100 mg  100 mg Oral BID Wynetta Fines T, MD      ? enoxaparin (LOVENOX) injection 40 mg  40 mg Subcutaneous Q24H Wynetta Fines T, MD   40 mg at 07/29/21 1837  ? fluticasone (FLONASE) 50 MCG/ACT nasal spray 2 spray  2 spray Each Nare Daily Wynetta Fines T, MD      ? gabapentin (NEURONTIN) capsule 800 mg  800 mg Oral BID Wynetta Fines T, MD   800 mg at 07/30/21 [id number]  ? hydrALAZINE (APRESOLINE) tablet 50 mg  50 mg Oral Q8H Wynetta Fines T, MD   50 mg at 07/30/21 1209  ? ibuprofen (ADVIL) tablet 800 mg  800 mg Oral TID PRN Lequita Halt, MD   800 mg at 07/29/21 1836  ? insulin aspart (novoLOG) injection 0-20 Units  0-20 Units Subcutaneous TID WC Lequita Halt, MD   4 Units at 07/30/21 1209  ? insulin aspart (novoLOG) injection 0-5 Units  0-5 Units Subcutaneous QHS Wynetta Fines T, MD      ? insulin glargine-yfgn [facility]) injection 40 Units  40 Units Subcutaneous QHS Georganna Skeans, MD   40 Units at 07/29/21 2125  ? lamoTRIgine (LAMICTAL) tablet 200 mg  200 mg Oral Daily Wynetta Fines T, MD   200 mg at 07/30/21 0851  ? Lifitegrast 5 % SOLN 1 drop  1 drop Both Eyes BID Wynetta Fines T, MD      ? loperamide (IMODIUM) capsule 2 mg  2 mg Oral BID Wynetta Fines T, MD   2 mg at 07/30/21 [id number]  ? losartan (COZAAR) tablet 100 mg  100 mg Oral Daily Wynetta Fines T, MD   100 mg at 07/30/21 [id number]  ? magnesium oxide (MAG-OX) tablet 400 mg  400 mg Oral Daily Wynetta Fines T, MD   400 mg at 07/30/21 0851  ?  montelukast (SINGULAIR) tablet 10 mg  10 mg Oral QHS Wynetta Fines T, MD   10 mg at 07/29/21 2126  ? Oxcarbazepine (TRILEPTAL) tablet 300 mg  300 mg Oral BID Wynetta Fines T, MD   300 mg at 07/30/21 [id number]  ? oxyCODONE (Oxy IR/ROXICODONE) immediate release tablet 15 mg  15 mg Oral Q6H PRN [place],  Ralene Cork, MD   15 mg at 07/30/21 0606  ? pantoprazole (PROTONIX) EC tablet 40 mg  40 mg Oral BID Wynetta Fines T, MD   40 mg at 07/30/21 0851  ? tiZANidine (ZANAFLEX) tablet 4 mg  4 mg Oral Q12H Lequita Halt, MD   4 mg at 07/30/21 [id number]  ? vitamin B-12 (CYANOCOBALAMIN) tablet 1,000 mcg  1,000 mcg Oral q AM Lequita Halt, MD   1,000 mcg at 07/30/21 0603  ? zolpidem (AMBIEN) tablet 5 mg  5 mg Oral QHS Lequita Halt, MD   5 mg at 07/29/21 2127  ? ? ? ?Discharge Medications: ?Please see discharge summary for a list of discharge medications. ? ?Relevant Imaging Results: ? ?Relevant Lab Results: ? ? ?Additional Information ?SS#: [id number] ? ?Geralynn Ochs, LCSW ? ? ? ? ?

## 2021-07-30 NOTE — Progress Notes (Signed)
?PROGRESS NOTE ? ? ? ?[name]  MCN:[id number] DOB: 11/16/1960 DOA: 07/29/2021 ?PCP: Beverley Fiedler, FNP  ? ? ?Brief Narrative:  ?61 year old female with history of multiple sclerosis diagnosed in 2008 on Tecfidera, chronic ambulatory dysfunction, diabetic neuropathy and fibromyalgia, hypertension, insulin-dependent type 2 diabetes, morbid obesity presented with worsening bilateral lower extremity weakness, pain and numbness.  She was recently hospitalized with negative MRI of the spine and brain for new lesions but treated with 5 days of high-dose IV steroids and was discharged to a skilled nursing facility.  She stayed there for 4 days and went back to her daughter's house.  She could not get up and had extreme bilateral lower extremity numbness and weakness with frequent falls so came back to the emergency room.  In the emergency room, hemodynamically stable.  Potassium 3.  Normal ESR and CRP.  Skeletal survey negative.  Admitted with bilateral lower extremity weakness and neurology was consulted. ? ? ?Assessment & Plan: ?  ?Acute on chronic ambulatory dysfunction: Multifactorial.  Presents with bilateral below-knee paresis and paresthesia.  Seen by neurology.  Recently treated for MS flare.  Less likely MS flare. ?Working diagnosis remains multifactorial weakness, musculoskeletal pain. ?Solu-Medrol 500 mg x 1 given 3/14. ?Infection work-up in progress, so far negative. ?Will need aggressive rehab.  PT OT consulted.  She will resume long-term medication and outpatient neurology follow-up on discharge. ? ?Type 2 diabetes, well controlled with hyperglycemia.  On home dose of insulin.  Continue.  ? ?Hypokalemia: Replaced.  Adequate.  Magnesium is adequate. ? ?Chronic pain syndrome and diabetic neuropathy: ?Patient on Trileptal, baclofen, amitriptyline and Cymbalta and gabapentin.  She is also on opiates as needed along with Lamictal.  Continued. ? ? ?DVT prophylaxis: enoxaparin (LOVENOX) injection 40 mg  Start: 07/29/21 1800 ? ? ?Code Status: Full code ?Family Communication: None ?Disposition Plan: Status is: Observation ?The patient will require care spanning > 2 midnights and should be moved to inpatient because: Significant bilateral leg weakness.  Inability to walk.  Will need rehab. ?  ? ? ?Consultants:  ?Neurology ? ?Procedures:  ?None ? ?Antimicrobials:  ?None ? ? ?Subjective: ?Patient seen and examined.  No overnight events.  She tells me that she cannot get up and walk with weakness of both legs.  Patient wonders if she has to go back to rehab again. ? ?Objective: ?Vitals:  ? 07/29/21 2020 07/29/21 2338 07/30/21 0310 07/30/21 0917  ?BP:  134/72 123/73 131/62  ?Pulse:  91 95 (!) 106  ?Resp:  _0 ?Temp:  98.1 ?F (36.7 ?C) (!) 97.3 ?F (36.3 ?C) 98.9 ?F (37.2 ?C)  ?TempSrc:  Oral Oral Oral  ?SpO2:  90% 90% 93%  ?Weight: 117.4 kg     ?Height: 5' 3" (1.6 m)     ? ? ?Intake/Output Summary (Last 24 hours) at 07/30/2021 1251 ?Last data filed at 07/30/2021 6294 ?Gross per 24 hour  ?Intake 295 ml  ?Output 400 ml  ?Net -105 ml  ? ?Filed Weights  ? 07/29/21 2020  ?Weight: 117.4 kg  ? ? ?Examination: ? ?General exam: Appears calm and comfortable  ?Respiratory system: Clear to auscultation. Respiratory effort normal. ?Cardiovascular system: S1 & S2 heard, RRR.  ?Gastrointestinal system: Soft.  Nontender.  Bowel sound present. ?Central nervous system: Alert and oriented.  ?She does not have any motor weakness.  Generalized weakness of both legs. ?Patient has subjective superficial loss of sensation on both legs. ?Left knee with chronic healing full-thickness abrasion on  dressing. ? ? ?Data Reviewed: I have personally reviewed following labs and imaging studies ? ?CBC: ?Recent Labs  ?Lab 07/29/21 ?1145  ?WBC 10.1  ?NEUTROABS 7.6  ?HGB 12.9  ?HCT 37.9  ?MCV 91.5  ?PLT 276  ? ?Basic Metabolic Panel: ?Recent Labs  ?Lab 07/29/21 ?1145 07/29/21 ?1744  ?NA 143  --   ?K 3.0*  --   ?CL 106  --   ?CO2 28  --   ?GLUCOSE 98  --    ?BUN 8  --   ?CREATININE 1.11*  --   ?CALCIUM 9.5  --   ?MG  --  2.1  ? ?GFR: ?Estimated Creatinine Clearance: 65.9 mL/min (A) (by C-G formula based on SCr of 1.11 mg/dL (H)). ?Liver Function Tests: ?No results for input(s): AST, ALT, ALKPHOS, BILITOT, PROT, ALBUMIN in the last 168 hours. ?No results for input(s): LIPASE, AMYLASE in the last 168 hours. ?No results for input(s): AMMONIA in the last 168 hours. ?Coagulation Profile: ?No results for input(s): INR, PROTIME in the last 168 hours. ?Cardiac Enzymes: ?No results for input(s): CKTOTAL, CKMB, CKMBINDEX, TROPONINI in the last 168 hours. ?BNP (last 3 results) ?No results for input(s): PROBNP in the last 8760 hours. ?HbA1C: ?No results for input(s): HGBA1C in the last 72 hours. ?CBG: ?Recent Labs  ?Lab 07/29/21 ?1724 07/29/21 ?2117 07/30/21 ?8937 07/30/21 ?1142  ?GLUCAP 78 141* 167* 172*  ? ?Lipid Profile: ?No results for input(s): CHOL, HDL, LDLCALC, TRIG, CHOLHDL, LDLDIRECT in the last 72 hours. ?Thyroid Function Tests: ?No results for input(s): TSH, T4TOTAL, FREET4, T3FREE, THYROIDAB in the last 72 hours. ?Anemia Panel: ?No results for input(s): VITAMINB12, FOLATE, FERRITIN, TIBC, IRON, RETICCTPCT in the last 72 hours. ?Sepsis Labs: ?No results for input(s): PROCALCITON, LATICACIDVEN in the last 168 hours. ? ?Recent Results (from the past 240 hour(s))  ?Resp Panel by RT-PCR (Flu A&B, Covid) Nasopharyngeal Swab     Status: None  ? Collection Time: 07/29/21 11:25 AM  ? Specimen: Nasopharyngeal Swab; Nasopharyngeal(NP) swabs in vial transport medium  ?Result Value Ref Range Status  ? SARS Coronavirus 2 by RT PCR NEGATIVE NEGATIVE Final  ?  Comment: (NOTE) ?SARS-CoV-2 target nucleic acids are NOT DETECTED. ? ?The SARS-CoV-2 RNA is generally detectable in upper respiratory ?specimens during the acute phase of infection. The lowest ?concentration of SARS-CoV-2 viral copies this assay can detect is ?138 copies/mL. A negative result does not preclude  SARS-Cov-2 ?infection and should not be used as the sole basis for treatment or ?other patient management decisions. A negative result may occur with  ?improper specimen collection/handling, submission of specimen other ?than nasopharyngeal swab, presence of viral mutation(s) within the ?areas targeted by this assay, and inadequate number of viral ?copies(<138 copies/mL). A negative result must be combined with ?clinical observations, patient history, and epidemiological ?information. The expected result is Negative. ? ?Fact Sheet for Patients:  ?EntrepreneurPulse.com.au ? ?Fact Sheet for Healthcare Providers:  ?IncredibleEmployment.be ? ?This test is no t yet approved or cleared by the [place] FDA and  ?has been authorized for detection and/or diagnosis of SARS-CoV-2 by ?FDA under an Emergency Use Authorization (EUA). This EUA will remain  ?in effect (meaning this test can be used) for the duration of the ?COVID-19 declaration under Section 564(b)(1) of the Act, 21 ?U.S.C.section 360bbb-3(b)(1), unless the authorization is terminated  ?or revoked sooner.  ? ? ?  ? Influenza A by PCR NEGATIVE NEGATIVE Final  ? Influenza B by PCR NEGATIVE NEGATIVE Final  ?  Comment: (NOTE) ?The  Xpert Xpress SARS-CoV-2/FLU/RSV plus assay is intended as an aid ?in the diagnosis of influenza from Nasopharyngeal swab specimens and ?should not be used as a sole basis for treatment. Nasal washings and ?aspirates are unacceptable for Xpert Xpress SARS-CoV-2/FLU/RSV ?testing. ? ?Fact Sheet for Patients: ?EntrepreneurPulse.com.au ? ?Fact Sheet for Healthcare Providers: ?IncredibleEmployment.be ? ?This test is not yet approved or cleared by the [place] FDA and ?has been authorized for detection and/or diagnosis of SARS-CoV-2 by ?FDA under an Emergency Use Authorization (EUA). This EUA will remain ?in effect (meaning this test can be used) for the duration of  the ?COVID-19 declaration under Section 564(b)(1) of the Act, 21 U.S.C. ?section 360bbb-3(b)(1), unless the authorization is terminated or ?revoked. ? ?Performed at [facility], [place] [address] [place], [place] ?

## 2021-07-30 NOTE — TOC Initial Note (Signed)
Transition of Care (TOC) - Initial/Assessment Note  ? ? ?Patient Details  ?Name: [name] ?MRN: [id number] ?Date of Birth: Jul 02, 1960 ? ?Transition of [facility]) CM/SW Contact:    ?Geralynn Ochs, LCSW ?Phone Number: ?07/30/2021, 4:08 PM ? ?Clinical Narrative:           CSW met with patient at bedside to discuss SNF placement, as patient not qualifying for CIR. Patient understandably frustrated, but understanding that she'll need SNF again. She does not want to return to [facility], agreeable to looking at other options. CSW provided patient with CMS choice list to review, and will check back in with patient tomorrow for SNF choice. CSW to follow.      ? ? ?Expected Discharge Plan: [place] ?Barriers to Discharge: Continued Medical Work up, Ship broker ? ? ?Patient Goals and CMS Choice ?Patient states their goals for this hospitalization and ongoing recovery are:: to get rehab and walk better ?CMS Medicare.gov Compare Post Acute Care list provided to:: Patient ?Choice offered to / list presented to : Patient ? ?Expected Discharge Plan and Services ?Expected Discharge Plan: [place] ?  ?  ?Post Acute Care Choice: [place] ?Living arrangements for the past 2 months: [place] ?                ?  ?  ?  ?  ?  ?  ?  ?  ?  ?  ? ?Prior Living Arrangements/Services ?Living arrangements for the past 2 months: [place] ?Lives with:: Adult Children ?Patient language and need for interpreter reviewed:: No ?Do you feel safe going back to the place where you live?: Yes      ?Need for Family Participation in Patient Care: No (Comment) ?Care giver support system in place?: Yes (comment) ?  ?Criminal Activity/Legal Involvement Pertinent to Current Situation/Hospitalization: No - Comment as needed ? ?Activities of Daily Living ?Home Assistive Devices/Equipment: Gilford Rile (specify type) ?ADL Screening (condition at time of admission) ?Patient's  cognitive ability adequate to safely complete daily activities?: Yes ?Is the patient deaf or have difficulty hearing?: No ?Does the patient have difficulty seeing, even when wearing glasses/contacts?: No ?Does the patient have difficulty concentrating, remembering, or making decisions?: No ?Patient able to express need for assistance with ADLs?: Yes ?Does the patient have difficulty dressing or bathing?: No ?Independently performs ADLs?: No ?Communication: Independent ?Grooming: Independent ?Feeding: Independent ?Bathing: Independent ?Toileting: Needs assistance ?Is this a change from baseline?: Change from baseline, expected to last >3days ?In/Out Bed: Needs assistance ?Is this a change from baseline?: Change from baseline, expected to last >3 days ?Walks in Home: Dependent ?Is this a change from baseline?: Change from baseline, expected to last >3 days ?Does the patient have difficulty walking or climbing stairs?: Yes ?Weakness of Legs: Both ?Weakness of Arms/Hands: None ? ?Permission Sought/Granted ?Permission sought to share information with : Customer service manager ?Permission granted to share information with : Yes, Verbal Permission Granted ?   ? Permission granted to share info w AGENCY: SNF ?   ?   ? ?Emotional Assessment ?Appearance:: Appears stated age ?Attitude/Demeanor/Rapport: Engaged ?Affect (typically observed): Appropriate ?Orientation: : Oriented to Self, Oriented to Place, Oriented to  Time, Oriented to Situation ?Alcohol / Substance Use: Not Applicable ?Psych Involvement: No (comment) ? ?Admission diagnosis:  Impaired ambulation [R26.2] ?Patient Active Problem List  ? Diagnosis Date Noted  ? Impaired ambulation 07/29/2021  ? UTI (urinary tract infection) 07/16/2021  ? Chronic pain  07/06/2021  ? Dysesthesia 06/12/2021  ? Pain in both lower extremities 01/14/2021  ? Acute metabolic encephalopathy 03/97/9536  ? Hyperosmolar non-ketotic state due to type 2 diabetes mellitus ([place]) 12/10/2020   ? Leg wound, right, initial encounter 12/10/2020  ? Hyperglycemia 12/10/2020  ? Essential tremor 07/15/2020  ? Gait disturbance 06/13/2020  ? High risk medication use 12/08/2019  ? Left carpal tunnel syndrome 08/19/2012  ? Depression with anxiety 08/19/2012  ? Lumbar spondylosis 03/07/2012  ? Osteoarthrosis, unspecified whether generalized or localized, lower leg 03/07/2012  ? Diabetic peripheral neuropathy ([place]) 03/07/2012  ? SMOKER 06/02/2010  ? SIMPLE CHRONIC BRONCHITIS 06/02/2010  ? DYSPNEA 06/02/2010  ? SKIN RASH 08/05/2009  ? ACUTE CYSTITIS 05/03/2009  ? DM (diabetes mellitus), type 2 with complications ([place]) 92/23/0097  ? INSOMNIA 02/12/2009  ? Obesity, Class III, BMI 40-49.9 (morbid obesity) ([place]) 11/28/2008  ? LIVER FUNCTION TESTS, ABNORMAL, HX OF 11/28/2008  ? DEGENERATION INTERVERTEBRAL DISC SITE UNSPEC 07/02/2008  ? Other chronic pain 05/22/2008  ? BLURRED VISION 05/22/2008  ? ESOPHAGEAL REFLUX 01/10/2008  ? Multiple sclerosis ([place]) 12/09/2007  ? URINARY RETENTION 12/09/2007  ? TACHYCARDIA 11/17/2007  ? Essential hypertension 11/08/2007  ? ?PCP:  Beverley Fiedler, FNP ?Pharmacy:   ?[id number] [address] [place] [address] ?[place] [address] ?Phone: [phone] Fax: [phone] ? ?[place], [place] ?[address] ?[place] [address] ?Phone: [phone] Fax: [phone] ? ? ? ? ?Social Determinants of Health (SDOH) Interventions ?  ? ?Readmission Risk Interventions ?No flowsheet data found. ? ? ?

## 2021-07-30 NOTE — Consult Note (Addendum)
WOC Nurse Consult Note: ?Reason for Consult: Consult requested for left knee.  Pt states she has been followed by the outpatient wound care center prior to admission and they have ordered xeroform applied every other day. ?Wound type: Left knee with chronic healing full thickness abrasion; 1X1X.1cm, 100% red and dry.  It is difficult to promote healing over the knee area since it is constantly moving.  ?Dressing procedure/placement/frequency: Continue present plan of care as previously ordered by the outpatient wound care center.  Topical treatment orders provided for bedside nurses to perform: Change dressing to left knee Q M/W/F as follows: cut piece of xeroform gauze and apply, then cover with foam dressing.  (Change foam dressing Q 3 days or PRN soiling.) ?Please re-consult if further assistance is needed.  Thank-you,  ?Cammie Mcgee MSN, RN, CWOCN, [place], CNS ?[phone]  ? ?  ?

## 2021-07-30 NOTE — Progress Notes (Signed)
Inpatient Rehab Admissions Coordinator:  ? ?Per therapy recommendations, patient was screened for CIR candidacy by Megan Salon, MS, CCC-SLP ?[place] At this time, Pt. does not appear to demonstrate medical necessity to justify in hospital rehabilitation/CIR. She remains observation status and per TOC is to remain observation status. Pt.'s payor is also very unlikely to approve for her dx.  I will not pursue a rehab consult for this Pt.   Recommend other rehab venues to be pursued.  Please contact me with any questions. ? ?Megan Salon, MS, CCC-SLP ?Rehab Admissions Coordinator  ?[phone] (celll) ?[phone] (office) ? ?

## 2021-07-31 DIAGNOSIS — R262 Difficulty in walking, not elsewhere classified: Secondary | ICD-10-CM | POA: Diagnosis not present

## 2021-07-31 LAB — GLUCOSE, CAPILLARY
Glucose-Capillary: 119 mg/dL — ABNORMAL HIGH (ref 70–99)
Glucose-Capillary: 113 mg/dL — ABNORMAL HIGH (ref 70–99)
Glucose-Capillary: 105 mg/dL — ABNORMAL HIGH (ref 70–99)
Glucose-Capillary: 77 mg/dL (ref 70–99)
Glucose-Capillary: 184 mg/dL — ABNORMAL HIGH (ref 70–99)

## 2021-07-31 MED ORDER — ZOLPIDEM TARTRATE 10 MG PO TABS: 10.0000 mg | ORAL_TABLET | Freq: Every day | ORAL | 0 refills | Status: DC

## 2021-07-31 MED ORDER — DIPHENOXYLATE-ATROPINE 2.5-0.025 MG PO TABS
1.0000 | ORAL_TABLET | Freq: Two times a day (BID) | ORAL | 0 refills | Status: DC
Start: 2021-07-31 — End: 2021-11-21

## 2021-07-31 MED ORDER — OXYCODONE HCL 15 MG PO TABS: 15.0000 mg | ORAL_TABLET | Freq: Four times a day (QID) | ORAL | 0 refills | Status: AC | PRN

## 2021-07-31 MED ORDER — LOPERAMIDE HCL 2 MG PO CAPS: 2.0000 mg | ORAL_CAPSULE | Freq: Two times a day (BID) | ORAL | 0 refills | Status: AC

## 2021-07-31 MED ORDER — ENOXAPARIN SODIUM 60 MG/0.6ML IJ SOSY
55.0000 mg | PREFILLED_SYRINGE | INTRAMUSCULAR | Status: DC
  Administered 2021-07-31: 55 mg via SUBCUTANEOUS
  Filled 2021-07-31: qty 0.6

## 2021-07-31 NOTE — Discharge Summary (Signed)
Physician Discharge Summary  ?[name] KXF:[id number] DOB: Jul 11, 1960 DOA: 07/29/2021 ? ?PCP: Beverley Fiedler, FNP ? ?Admit date: 07/29/2021 ?Discharge date: 07/31/2021 ? ?Admitted From: Home ?Disposition: Skilled nursing facility ? ?Recommendations for Outpatient Follow-up:  ?Schedule follow-up with neurology as outpatient. ? ?Home Health: N/A ?Equipment/Devices: N/A ? ?Discharge Condition: Stable ?CODE STATUS: Full code ?Diet recommendation: Low-salt diet ? ?Discharge summary: ? ?Brief Narrative:  ?61 year old female with history of multiple sclerosis diagnosed in 2008 on Tecfidera, chronic ambulatory dysfunction, diabetic neuropathy and fibromyalgia, hypertension, insulin-dependent type 2 diabetes, morbid obesity presented with worsening bilateral lower extremity weakness, pain and numbness.  She was recently hospitalized with negative MRI of the spine and brain for new lesions but treated with 5 days of high-dose IV steroids and was discharged to a skilled nursing facility.  She stayed there for 4 days and went back to her daughter's house.  She could not get up and had extreme bilateral lower extremity numbness and weakness with frequent falls so came back to the emergency room.  In the emergency room, hemodynamically stable.  Potassium 3.  Normal ESR and CRP.  Skeletal survey negative.  Admitted with bilateral lower extremity weakness and neurology was consulted.  ?  ?Assessment & Plan: ?  ?Acute on chronic ambulatory dysfunction: Multifactorial.  Presented with bilateral below-knee paresis and paresthesia.  Seen by neurology.  Recently treated for MS flare.  Unlikely thought to be MS floor. ?Patient has multifactorial weakness and musculoskeletal pain.  She suffers from fibromyalgia, she is also on pain management.  She is on multiple medications. ?Solu-Medrol 500 mg x 1 given 3/14. ?Infection work-up in progress, so far negative. ?Will need aggressive rehab.  PT OT seeing.  She will benefit with  inpatient therapies at a skilled nursing facility. ?  ?Type 2 diabetes, well controlled with hyperglycemia.  On home dose of insulin.  Continue.  ?  ?Hypokalemia: Replaced.  Adequate.  Magnesium is adequate. ?  ?Chronic pain syndrome and diabetic neuropathy: ?Patient on Trileptal, baclofen, amitriptyline and Cymbalta and gabapentin.  She is also on opiates as needed along with Lamictal.  Continued. ? ?Patient is medically stable to transfer to skilled level of care. ? ?Discharge Diagnoses:  ?Principal Problem: ?  Impaired ambulation ?Active Problems: ?  Multiple sclerosis ([place]) ? ? ? ?Discharge Instructions ? ?Discharge Instructions   ? ? Diet - low sodium heart healthy   Complete by: As directed ?  ? Discharge wound care:   Complete by: As directed ?  ? Change dressing to left knee Q M/W/F as follows: cut piece of xeroform gauze and apply, then cover with foam dressing.  (Change foam dressing Q 3 days or PRN soiling.)  ? Increase activity slowly   Complete by: As directed ?  ? ?  ? ?Allergies as of 07/31/2021   ?No Known Allergies ?  ? ?  ?Medication List  ?  ? ?STOP taking these medications   ? ?Anti-Diarrheal 2 MG tablet ?Generic drug: loperamide ?Replaced by: loperamide 2 MG capsule ?  ? ?  ? ?TAKE these medications   ? ?Accu-Chek Guide test strip ?Generic drug: glucose blood ?Use as instructed up to 4 times daily ?  ?Accu-Chek Guide w/Device Kit ?use as directed ?  ?Accu-Chek Softclix Lancets lancets ?Use as directed up to 4 times daily ?  ?amantadine 100 MG capsule ?Commonly known as: SYMMETREL ?Take 1 capsule (100 mg total) by mouth 2 (two) times daily. ?  ?amitriptyline 25 MG tablet ?Commonly  known as: ELAVIL ?TAKE 1 TABLET(25 MG) BY MOUTH AT BEDTIME ?  ?amLODipine 10 MG tablet ?Commonly known as: NORVASC ?Take 1 tablet (10 mg total) by mouth daily. ?  ?atorvastatin 80 MG tablet ?Commonly known as: LIPITOR ?Take 1 tablet (80 mg total) by mouth daily. ?What changed: when to take this ?  ?baclofen 20 MG  tablet ?Commonly known as: LIORESAL ?Take 20 mg by mouth 2 (two) times daily. ?  ?BD Pen Needle Nano U/F 32G X 4 MM Misc ?Generic drug: Insulin Pen Needle ?use as directed 4 times daily ?  ?busPIRone 15 MG tablet ?Commonly known as: BUSPAR ?Take 15 mg by mouth 2 (two) times daily. ?  ?Dimethyl Fumarate 240 MG Cpdr ?Commonly known as: Tecfidera ?Take 1 capsule (240 mg total) by mouth 2 (two) times daily. ?  ?diphenoxylate-atropine 2.5-0.025 MG tablet ?Commonly known as: LOMOTIL ?Take 1 tablet by mouth in the morning and at bedtime. ?  ?DULoxetine 60 MG capsule ?Commonly known as: CYMBALTA ?Take 60 mg by mouth 2 (two) times daily. ?  ?fluticasone 50 MCG/ACT nasal spray ?Commonly known as: FLONASE ?Place 2 sprays into the nose daily. ?What changed: when to take this ?  ?gabapentin 400 MG capsule ?Commonly known as: NEURONTIN ?Take 2 capsules (800 mg total) by mouth 2 (two) times daily. ?  ?HAIR/SKIN/NAILS PO ?Take 3 tablets by mouth in the morning and at bedtime. ?  ?HumaLOG KwikPen 100 UNIT/ML KwikPen ?Generic drug: insulin lispro ?Inject 10 Units into the skin 3 (three) times daily with meals. ?What changed:  ?how much to take ?additional instructions ?  ?hydrALAZINE 50 MG tablet ?Commonly known as: APRESOLINE ?Take 1 tablet (50 mg total) by mouth every 8 (eight) hours. ?  ?ibuprofen 800 MG tablet ?Commonly known as: ADVIL ?Take 800 mg by mouth 3 (three) times daily as needed for moderate pain. ?  ?lamoTRIgine 200 MG tablet ?Commonly known as: LAMICTAL ?TAKE 1 TABLET BY MOUTH TWICE DAILY ?  ?Lantus SoloStar 100 UNIT/ML Solostar Pen ?Generic drug: insulin glargine ?Inject 40 Units into the skin daily. ?  ?LEG CRAMPS PO ?Place 3 tablets under the tongue daily as needed (For leg cramps). ?  ?loperamide 2 MG capsule ?Commonly known as: IMODIUM ?Take 1 capsule (2 mg total) by mouth 2 (two) times daily. ?Replaces: Anti-Diarrheal 2 MG tablet ?  ?losartan 100 MG tablet ?Commonly known as: COZAAR ?Take 1 tablet (100 mg total)  by mouth daily. ?  ?MAGnesium-Oxide 400 (240 Mg) MG tablet ?Generic drug: magnesium oxide ?Take 1 tablet by mouth daily. ?  ?Melatonin Maximum Strength 5 MG Tabs ?Generic drug: melatonin ?Take 5 mg by mouth at bedtime. ?  ?montelukast 10 MG tablet ?Commonly known as: SINGULAIR ?Take 10 mg by mouth at bedtime. ?  ?Narcan 4 MG/0.1ML Liqd nasal spray kit ?Generic drug: naloxone ?Place 1 spray into the nose as directed. overdose ?  ?Oxcarbazepine 300 MG tablet ?Commonly known as: TRILEPTAL ?Take 1 tablet (300 mg total) by mouth 2 (two) times daily. ?  ?oxyCODONE 15 MG immediate release tablet ?Commonly known as: ROXICODONE ?Take 1 tablet (15 mg total) by mouth every 6 (six) hours as needed for up to 5 days for pain. ?  ?pantoprazole 40 MG tablet ?Commonly known as: PROTONIX ?Take 40 mg by mouth 2 (two) times daily. ?  ?tiZANidine 2 MG tablet ?Commonly known as: ZANAFLEX ?Take 4 mg by mouth in the morning and at bedtime. ?  ?Viberzi 100 MG Tabs ?Generic drug: Eluxadoline ?Take 100 mg  by mouth 2 (two) times daily. ?  ?vitamin B-12 1000 MCG tablet ?Commonly known as: CYANOCOBALAMIN ?Take 1,000 mcg by mouth in the morning. ?  ?Vitamin D-3 125 MCG (5000 UT) Tabs ?Take 5,000 Units by mouth in the morning. ?  ?Xiidra 5 % Soln ?Generic drug: Lifitegrast ?Place 1 drop into both eyes in the morning and at bedtime. ?  ?zolpidem 10 MG tablet ?Commonly known as: AMBIEN ?Take 1 tablet (10 mg total) by mouth at bedtime. ?  ? ?  ? ?  ?  ? ? ?  ?Discharge Care Instructions  ?(From admission, onward)  ?  ? ? ?  ? ?  Start     Ordered  ? 07/31/21 0000  Discharge wound care:       ?Comments: Change dressing to left knee Q M/W/F as follows: cut piece of xeroform gauze and apply, then cover with foam dressing.  (Change foam dressing Q 3 days or PRN soiling.)  ? 07/31/21 1059  ? ?  ?  ? ?  ? ? ?No Known Allergies ? ?Consultations: ?Neurology ? ? ?Procedures/Studies: ?DG Tibia/Fibula Right ? ?Result Date: 07/06/2021 ?CLINICAL DATA:  Fall, leg  pain EXAM: RIGHT TIBIA AND FIBULA - 2 VIEW COMPARISON:  None. FINDINGS: No acute fracture or dislocation identified. No significant soft tissue abnormality identified. IMPRESSION: No acute fracture identified. Ele

## 2021-07-31 NOTE — Progress Notes (Signed)
Physical Therapy Treatment ?Patient Details ?Name: [name] ?MRN: [id number] ?DOB: May 21, 1960 ?Today's Date: 07/31/2021 ? ? ?History of Present Illness Pt is a 61 y/o female who presented with generalized weakness, bilateral leg numbness tingling and falls over last couple days. Recent admission for MS flare with d/c from SNF after 4 days 2? "insurance issues". Pt admitted for progressing weakness following MS flare. Medical history significant of  MS, HTN, DM, neuropathy, fibromyalgia (per pt report). ? ?  ?PT Comments  ? ? Focus of session today was functional bed mobility, STS tranfers, and gait trial. Pt. Was able to ambulate out of room and in hallway ~100 ft with one standing rest break. The patient tolerated well.  Pt. Shows overall improvement with her functional mobility, transfer ability, static and dynamic balance, and gait tolerance. Overall pain and fatigue/weakness secondary to MS are/is still limiting function. Pt. Would benefit from skilled PT to continue to address her functional mobility, transfer ability, balance, and ambulation endurance. Extensive education on activity and fatigue management performed using an energy conservation strategy as well as benefit of OPPT once d/c'd from SNF; pt and daughter accepted and understood. Discharge recommendations need to be updated to SNF secondary to insurance approval. Pt to follow acutely as appropriate.  ?   ?Recommendations for follow up therapy are one component of a multi-disciplinary discharge planning process, led by the attending physician.  Recommendations may be updated based on patient status, additional functional criteria and insurance authorization. ? ?Follow Up Recommendations ? Skilled nursing-short term rehab (<3 hours/day) ?  ?  ?Assistance Recommended at Discharge Set up Supervision/Assistance  ?Patient can return home with the following A little help with bathing/dressing/bathroom;Assistance with cooking/housework;Help with stairs or  ramp for entrance;Assist for transportation ?  ?Equipment Recommendations ? None recommended by PT  ?  ?Recommendations for Other Services   ? ? ?  ?Precautions / Restrictions Precautions ?Precautions: Fall ?Restrictions ?Weight Bearing Restrictions: No  ?  ? ?Mobility ? Bed Mobility ?Overal bed mobility: Needs Assistance ?Bed Mobility: Supine to Sit ?  ?  ?Supine to sit: Supervision, HOB elevated ?  ?  ?General bed mobility comments: Increased time, pt. able to perfrom all scooting but requires assist of the bed rails ?  ? ?Transfers ?Overall transfer level: Needs assistance ?Equipment used: Rollator (4 wheels) ?Transfers: Sit to/from Stand ?Sit to Stand: Min assist ?  ?  ?  ?  ?  ?General transfer comment: Min A for power up and steady. Pt. able to stand up right with rollator with min difficulty. ?  ? ?Ambulation/Gait ?Ambulation/Gait assistance: Min guard ?Gait Distance (Feet): 100 Feet ?Assistive device: Rollator (4 wheels) ?Gait Pattern/deviations: Step-through pattern, Decreased stride length, Decreased weight shift to right, Decreased weight shift to left, Antalgic ?Gait velocity: decreased ?  ?  ?General Gait Details: Pt. able to walk and control rollator well throughout session. She states that she has significantly less pain than previous sessions. ? ? ?Stairs ?  ?  ?  ?  ?  ? ? ?Wheelchair Mobility ?  ? ?Modified Rankin (Stroke Patients Only) ?  ? ? ?  ?Balance Overall balance assessment: Needs assistance ?Sitting-balance support: No upper extremity supported ?Sitting balance-Leahy Scale: Good ?Sitting balance - Comments: Pt. able to reach minimally out of BOS and return to upright ?  ?Standing balance support: Bilateral upper extremity supported ?Standing balance-Leahy Scale: Poor ?Standing balance comment: Reliant on rollator. When sitting down she was able to fully let go while reaching  back for the chair. ?  ?  ?  ?  ?  ?  ?  ?  ?  ?  ?  ?  ? ?  ?Cognition Arousal/Alertness: Awake/alert ?Behavior  During Therapy: [facility] for tasks assessed/performed ?Overall Cognitive Status: Within Functional Limits for tasks assessed ?  ?  ?  ?  ?  ?  ?  ?  ?  ?  ?  ?  ?  ?  ?  ?  ?  ?  ?  ? ?  ?Exercises   ? ?  ?General Comments   ?  ?  ? ?Pertinent Vitals/Pain Pain Assessment ?Pain Assessment: 0-10 ?Pain Score: 10-Worst pain ever ?Pain Location: bilateral feet ?Pain Descriptors / Indicators: Burning, Constant, Discomfort, Grimacing ?Pain Intervention(s): Limited activity within patient's tolerance, Monitored during session, Patient requesting pain meds-RN notified  ? ? ?Home Living   ?  ?  ?  ?  ?  ?  ?  ?  ?  ?   ?  ?Prior Function    ?  ?  ?   ? ?PT Goals (current goals can now be found in the care plan section) Acute Rehab PT Goals ?Patient Stated Goal: stop the pain ?PT Goal Formulation: With patient ?Time For Goal Achievement: 08/13/21 ?Potential to Achieve Goals: Good ?Progress towards PT goals: Progressing toward goals ? ?  ?Frequency ? ? ? Min 2X/week ? ? ? ?  ?PT Plan Discharge plan needs to be updated  ? ? ?Co-evaluation   ?  ?  ?  ?  ? ?  ?AM-PAC PT "6 Clicks" Mobility   ?Outcome Measure ? Help needed turning from your back to your side while in a flat bed without using bedrails?: None ?Help needed moving from lying on your back to sitting on the side of a flat bed without using bedrails?: A Little ?Help needed moving to and from a bed to a chair (including a wheelchair)?: A Little ?Help needed standing up from a chair using your arms (e.g., wheelchair or bedside chair)?: A Little ?Help needed to walk in hospital room?: A Little ?Help needed climbing 3-5 steps with a railing? : A Lot ?6 Click Score: 18 ? ?  ?End of Session Equipment Utilized During Treatment: Gait belt ?Activity Tolerance: Patient limited by pain ?Patient left: in chair;with call bell/phone within reach;with chair alarm set;with family/visitor present ?Nurse Communication: Mobility status ?PT Visit Diagnosis: Unsteadiness on feet (R26.81);Muscle  weakness (generalized) (M62.81);Difficulty in walking, not elsewhere classified (R26.2) ?  ? ? ?Time:  -  ?  ? ?Charges:             ?          ? ?Lorie Apley, SPT ?Acute Rehab Services ? ? ? ?Lorie Apley ?07/31/2021, 1:44 PM ? ?

## 2021-08-01 LAB — BASIC METABOLIC PANEL
Anion gap: 10 (ref 5–15)
BUN: 33 mg/dL — ABNORMAL HIGH (ref 8–23)
CO2: 26 mmol/L (ref 22–32)
Calcium: 9.6 mg/dL (ref 8.9–10.3)
Chloride: 106 mmol/L (ref 98–111)
Creatinine, Ser: 1.55 mg/dL — ABNORMAL HIGH (ref 0.44–1.00)
GFR, Estimated: 38 mL/min — ABNORMAL LOW (ref >60)
Glucose, Bld: 93 mg/dL (ref 70–99)
Potassium: 3.6 mmol/L (ref 3.5–5.1)
Sodium: 142 mmol/L (ref 135–145)

## 2021-08-01 LAB — GLUCOSE, CAPILLARY
Glucose-Capillary: 89 mg/dL (ref 70–99)
Glucose-Capillary: 146 mg/dL — ABNORMAL HIGH (ref 70–99)

## 2021-08-01 NOTE — TOC Transition Note (Signed)
Transition of Care (TOC) - CM/SW Discharge Note ? ? ?Patient Details  ?Name: [name] ?MRN: [id number] ?Date of Birth: 12-28-1960 ? ?Transition of Care [facility]) CM/SW Contact:  ?Geralynn Ochs, LCSW ?Phone Number: ?08/01/2021, 11:45 AM ? ? ?Clinical Narrative:   CSW received insurance authorization approval, confirmed bed availability at [facility]. Transport scheduled with [facility] for next available.  ? ?Nurse to call report to [phone]. ? ? ? ?Final next level of care: [place] ?Barriers to Discharge: Barriers Resolved ? ? ?Patient Goals and CMS Choice ?Patient states their goals for this hospitalization and ongoing recovery are:: to get rehab and walk better ?CMS Medicare.gov Compare Post Acute Care list provided to:: Patient ?Choice offered to / list presented to : Patient ? ?Discharge Placement ?  ?           ?Patient chooses bed at:  [facility]) ?Patient to be transferred to facility by: [facility] ?Name of family member notified: Self ?Patient and family notified of of transfer: 08/01/21 ? ?Discharge Plan and Services ?  ?  ?Post Acute Care Choice: [place]          ?  ?  ?  ?  ?  ?  ?  ?  ?  ?  ? ?Social Determinants of Health (SDOH) Interventions ?  ? ? ?Readmission Risk Interventions ?No flowsheet data found. ? ? ? ? ?

## 2021-08-01 NOTE — TOC Progression Note (Signed)
Transition of Care (TOC) - Progression Note  ? ? ?Patient Details  ?Name: [name] ?MRN: [id number] ?Date of Birth: 01/18/1961 ? ?Transition of Care (TOC) CM/SW Contact  ?Geralynn Ochs, LCSW ?Phone Number: ?08/01/2021, 11:44 AM ? ?Clinical Narrative:   CSW met with patient and daughter earlier today to discuss SNF options. Patient chose [facility] for SNF. CSW initiated insurance authorization request, but authorization is still pending at this time. CSW to follow. ? ? ? ?Expected Discharge Plan: [place] ?Barriers to Discharge: Continued Medical Work up, Ship broker ? ?Expected Discharge Plan and Services ?Expected Discharge Plan: [place] ?  ?  ?Post Acute Care Choice: [place] ?Living arrangements for the past 2 months: [place] ?Expected Discharge Date: 07/31/21               ?  ?  ?  ?  ?  ?  ?  ?  ?  ?  ? ? ?Social Determinants of Health (SDOH) Interventions ?  ? ?Readmission Risk Interventions ?No flowsheet data found. ? ?

## 2021-08-01 NOTE — Progress Notes (Signed)
Patient seen and examined.  No change in his status.  Sensation on her foot is getting better.  She still has significant pain and tingling. ? ?Stable for discharge to skilled level of care.  Continue to work with PT OT while in the hospital.  Discharge ready. ? ?Total time spent: 20 minutes. ?

## 2021-08-01 NOTE — Progress Notes (Signed)
Occupational Therapy Treatment ?Patient Details ?Name: [name] ?MRN: [id number] ?DOB: 10-28-1960 ?Today's Date: 08/01/2021 ? ? ?History of present illness Pt is a 61 y/o female who presented with generalized weakness, bilateral leg numbness tingling and falls over last couple days. Recent admission for MS flare with d/c from SNF after 4 days 2? "insurance issues". Pt admitted for progressing weakness following MS flare. Medical history significant of  MS, HTN, DM, neuropathy, fibromyalgia (per pt report). ?  ?OT comments ? Pt very motivated to work with OT this session. Session focused on dressing and LB bathing/pericare with multiple transfers. Pt requiring min-mod A with all ADL's this session, limited by balance, decreased activity tolerance, and weakness. Continuing to recommend OT services at a SNF. OT will follow acutely.  ? ?Recommendations for follow up therapy are one component of a multi-disciplinary discharge planning process, led by the attending physician.  Recommendations may be updated based on patient status, additional functional criteria and insurance authorization. ?   ?Follow Up Recommendations ? Skilled nursing-short term rehab (<3 hours/day)  ?  ?Assistance Recommended at Discharge Frequent or constant Supervision/Assistance  ?Patient can return home with the following ? A lot of help with walking and/or transfers;A lot of help with bathing/dressing/bathroom;Assistance with cooking/housework;Help with stairs or ramp for entrance ?  ?Equipment Recommendations ? None recommended by OT  ?  ?Recommendations for Other Services   ? ?  ?Precautions / Restrictions Precautions ?Precautions: Fall ?Restrictions ?Weight Bearing Restrictions: No  ? ? ?  ? ?Mobility Bed Mobility ?  ?  ?  ?  ?  ?  ?  ?General bed mobility comments: up in recliner on entry ?  ? ?Transfers ?Overall transfer level: Needs assistance ?Equipment used: Rolling walker (2 wheels) ?Transfers: Sit to/from Stand, Bed to  chair/wheelchair/BSC ?Sit to Stand: Min guard ?Stand pivot transfers: Min assist ?  ?  ?  ?  ?General transfer comment: Needs assist to power up and take small steps to pivot ?  ?  ?Balance Overall balance assessment: Needs assistance ?Sitting-balance support: No upper extremity supported ?Sitting balance-Leahy Scale: Good ?Sitting balance - Comments: Pt. able to reach minimally out of BOS and return to upright ?  ?Standing balance support: Bilateral upper extremity supported ?Standing balance-Leahy Scale: Poor ?Standing balance comment: Reliant on RW or external support when pulling pants up ?  ?  ?  ?  ?  ?  ?  ?  ?  ?  ?  ?   ? ?ADL either performed or assessed with clinical judgement  ? ?ADL Overall ADL's : Needs assistance/impaired ?  ?  ?  ?  ?  ?  ?Lower Body Bathing: Minimal assistance;Sitting/lateral leans;Sit to/from stand ?Lower Body Bathing Details (indicate cue type and reason): Min A due to LoB in standing to complete pericare ?Upper Body Dressing : Minimal assistance;Sitting ?Upper Body Dressing Details (indicate cue type and reason): min A for bra donning ?Lower Body Dressing: Moderate assistance;Sitting/lateral leans;Sit to/from stand ?Lower Body Dressing Details (indicate cue type and reason): Mod A to assist with threading feet and steadying in standing to pull up paints ?Toilet Transfer: Moderate assistance;Stand-pivot ?Toilet Transfer Details (indicate cue type and reason): simulated bed to recliner ?Toileting- Clothing Manipulation and Hygiene: Minimal assistance;Sitting/lateral lean;Sit to/from stand ?Toileting - Clothing Manipulation Details (indicate cue type and reason): simulated with pericare ?  ?  ?  ?General ADL Comments: Pt limited due to weakness and decreased activity tolerance. ?  ? ?Extremity/Trunk Assessment   ?  ?  ?  ?  ?  ? ?  Vision   ?  ?  ?Perception   ?  ?Praxis   ?  ? ?Cognition Arousal/Alertness: Awake/alert ?Behavior During Therapy: [facility] for tasks  assessed/performed ?Overall Cognitive Status: Within Functional Limits for tasks assessed ?  ?  ?  ?  ?  ?  ?  ?  ?  ?  ?  ?  ?  ?  ?  ?  ?  ?  ?  ?   ?Exercises   ? ?  ?Shoulder Instructions   ? ? ?  ?General Comments VSS on RA  ? ? ?Pertinent Vitals/ Pain       Pain Assessment ?Pain Assessment: No/denies pain ?Pain Score: 0-No pain ? ?Home Living   ?  ?  ?  ?  ?  ?  ?  ?  ?  ?  ?  ?  ?  ?  ?  ?  ?  ?  ? ?  ?Prior Functioning/Environment    ?  ?  ?  ?   ? ?Frequency ? Min 2X/week  ? ? ? ? ?  ?Progress Toward Goals ? ?OT Goals(current goals can now be found in the care plan section) ? Progress towards OT goals: Progressing toward goals ? ?Acute Rehab OT Goals ?Patient Stated Goal: To be independent again ?OT Goal Formulation: With patient ?Time For Goal Achievement: 08/13/21 ?Potential to Achieve Goals: Good ?ADL Goals ?Pt Will Perform Grooming: standing ?Pt Will Perform Lower Body Bathing: with min assist;with adaptive equipment;sitting/lateral leans;sit to/from stand ?Pt Will Perform Lower Body Dressing: with min assist;with adaptive equipment;sitting/lateral leans;sit to/from stand ?Pt Will Transfer to Toilet: with min guard assist;ambulating ?Pt Will Perform Toileting - Clothing Manipulation and hygiene: with min guard assist;with adaptive equipment;sitting/lateral leans;sit to/from stand ?Additional ADL Goal #1: Pt will tolerate standing ADL task for 5 mins with min guard.  ?Plan Discharge plan remains appropriate   ? ?Co-evaluation ? ? ?   ?  ?  ?  ?  ? ?  ?AM-PAC OT "6 Clicks" Daily Activity     ?Outcome Measure ? ? Help from another person eating meals?: None ?Help from another person taking care of personal grooming?: A Little ?Help from another person toileting, which includes using toliet, bedpan, or urinal?: A Little ?Help from another person bathing (including washing, rinsing, drying)?: A Lot ?Help from another person to put on and taking off regular upper body clothing?: A Little ?Help from another  person to put on and taking off regular lower body clothing?: A Lot ?6 Click Score: 17 ? ?  ?End of Session Equipment Utilized During Treatment: Rolling walker (2 wheels) ? ?OT Visit Diagnosis: Other abnormalities of gait and mobility (R26.89);Unsteadiness on feet (R26.81);Repeated falls (R29.6);Muscle weakness (generalized) (M62.81);History of falling (Z91.81);Pain ?  ?Activity Tolerance Patient tolerated treatment well ?  ?Patient Left in chair (EMT in room to take pt to SNF) ?  ?Nurse Communication Mobility status ?  ? ?   ? ?Time: 3838-1840 ?OT Time Calculation (min): 18 min ? ?Charges: OT General Charges ?$OT Visit: 1 Visit ?OT Treatments ?$Self Care/Home Management : 8-22 mins ? ?Danira Nylander H., OTR/L ?Acute Rehabilitation ? ?Ellissa Ayo Elane Bing Plume ?08/01/2021, 2:07 PM ?

## 2021-08-23 ENCOUNTER — Other Ambulatory Visit: Payer: Self-pay | Admitting: Neurology

## 2021-08-27 ENCOUNTER — Telehealth: Payer: Self-pay | Admitting: Neurology

## 2021-08-27 NOTE — Telephone Encounter (Signed)
Pt called stating that she just got out of the hospital and was needing to be scheduled as soon as possible to f/u with provider. Pt states she is having a new sx, shaking of the hands. She has requested for RN to call her back if she is able to be squeezed in sooner than appt scheduled that she was offered. Please advise. ?

## 2021-08-27 NOTE — Telephone Encounter (Signed)
Called pt back. She left rehab 08/17/21. At home now. Not doing well. Hands trembling, feels this worse. Otherwise, doing ok.  ? ?Scheduled work in visit with Dr. Epimenio Foot 09/03/21 at 2:30pm. Asked that she check in about 2:00/2:15pm. She verbalized understanding.  ?

## 2021-09-03 ENCOUNTER — Ambulatory Visit: Payer: Medicare Other | Admitting: Neurology

## 2021-09-03 ENCOUNTER — Encounter: Payer: Self-pay | Admitting: Neurology

## 2021-09-16 ENCOUNTER — Encounter: Payer: Self-pay | Admitting: Neurology

## 2021-09-16 ENCOUNTER — Ambulatory Visit (INDEPENDENT_AMBULATORY_CARE_PROVIDER_SITE_OTHER): Payer: Medicare Other | Admitting: Neurology

## 2021-09-16 VITALS — BP 130/80 | HR 94 | Ht 63.0 in | Wt 257.5 lb

## 2021-09-16 DIAGNOSIS — E1142 Type 2 diabetes mellitus with diabetic polyneuropathy: Secondary | ICD-10-CM

## 2021-09-16 DIAGNOSIS — G35 Multiple sclerosis: Secondary | ICD-10-CM

## 2021-09-16 DIAGNOSIS — R208 Other disturbances of skin sensation: Secondary | ICD-10-CM

## 2021-09-16 DIAGNOSIS — R269 Unspecified abnormalities of gait and mobility: Secondary | ICD-10-CM | POA: Diagnosis not present

## 2021-09-16 DIAGNOSIS — G250 Essential tremor: Secondary | ICD-10-CM

## 2021-09-16 DIAGNOSIS — Z79899 Other long term (current) drug therapy: Secondary | ICD-10-CM | POA: Diagnosis not present

## 2021-09-16 DIAGNOSIS — G8929 Other chronic pain: Secondary | ICD-10-CM

## 2021-09-16 NOTE — Progress Notes (Signed)
? ?[facility] ? ?PATIENT: [name] ?DOB: 06-06-1960 ? ?REFERRING DOCTOR OR PCP: Lucky Cowboy, FNP ?SOURCE: Patient, notes from primary care, imaging and lab reports, MRI images personally reviewed. ? ?_________________________________ ? ? ?HISTORICAL ? ?CHIEF COMPLAINT:  ?Chief Complaint  ?Patient presents with  ? Follow-up  ?  Rm 1, alone. Here to f/u from recent hospital visit. Pt continues to have bilateral hand shaking. Pt reports pn and spasms on buttocks.   ? ? ?HISTORY OF PRESENT ILLNESS:  ?[name] is a 61 y.o. woman with multiple sclerosis, LBP and gait disturbance ? ?Update 09/16/2021: ?She was hospitalized 07/06/2021 and received 5 days IV Su-Medrol.    She was feeling weaker in both legs.   She felt better after the steroid.    She did inpatient Rehab for a week afterwards.   ? ?She went back to the ED 07/29/2021 due to more weakness in her legs.   She had fallen several times between the hospital stays.  When EMT came she reports she couldn't stand and had no feeling in her legs.    She received one dose of IV Solumedrol and then went to a Rehab for 10 days.         ? ?She feels close to baseline but still feels a little more unsteady and notes more tremor in hands.    ?.  ?She is on Tecficdera and tolerates it well..  THe above issues have been her first exacerbation (though no definite change on MRI - no enhancing lesions  to explain symptoms)  ? ?She uses her cane and can go 100 feet on a good day but less on many days.   She goes further with a walker.   The left leg buckles and she falls   She is having some difficulty doing activities of daily living.   We tried  dalfampridine but it did not help.    We also tried amantadine but she noted no benefit for gait.  ? ?She has a burning dysesthetic pain in her arms and legs.   She is on lamotrigine as well for pain.   Gabapentin had not helped and she gained weight on it .  Her vision is doing about the same.  She has urinary  urgency and frequency. ? ?Mood is much worse and she is more anxious and depressed,    She is on Cymbalta 60 mg po qd and lamotrigine 200 mg po bid.     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  She is on eszopiclone   She sees [facility] for Pain Management and other controlled substances.    ? ?She also has LBP and sees Dr. Christoper Fabian for pain management and is on  oxycodone 15 mg q6  She had MBBB/RFA helped the right side but not the left side.  Surgery has been discussed with her.   She is also on tizanidine, baclofen and amitriptyline to help her pain.   She sees Dr. Maia Petties.    Pain is all over.   ? ? ?MS HISTORY ?She was diagnosed with MS in 2008 after presenting with leg weakness and headache, neck pain, back pain.  MRIs showed foci in her cervical spine and brain.   She was started on Rebif.   Initially she did well but she reports having a lot of relapses.   She was switched to Copaxone but also had more symptoms.   She was then switched  to Tysabri for several years but reports having several relapses.   She switched to Tecfidera 5 years ago.   She reports having a flare in 2015 and had 5 days of IV Solu-medrol.   July 2021, she was complaining of blurry vision, stiff neck and increased burning and tingling a few weeks ago.  She went to the ED and had MRIs performed showing no new lesions.   ? ?IMAGING: ?MRI of the cervical spine 11/18/2019 and 03/02/2012 showed two T2 hyperintense foci adjacent to C2 and C3-C4.  There was mild multilevel degenerative changes as well.   ? ?MRI of the brain 11/18/2019 and 03/02/2020 show a fairly low plaque burden of T2/FLAIR hyperintense foci.  Most of them are nonspecific.  A few are periventricular.  None acute and no change over time. ? ?MRI of the cervical spine 02/18/2021 showed no new lesions an stable DJD with C5C6 and C6C7 mild spinal senosis ? ?MRI of the brain 02/18/2021 showed no change compared to 11/18/2019 ? ?REVIEW OF  SYSTEMS: ?Constitutional: No fevers, chills, sweats, or change in appetite.  She has fatigue and she reports insomnia ?Eyes: No visual changes, double vision, eye pain ?Ear, nose and throat: No hearing loss, ear pain, nasal congestion, sore throat ?Cardiovascular: No chest pain, palpitations ?Respiratory:  No shortness of breath at rest or with exertion.   No wheezes ?GastrointestinaI: No nausea, vomiting, diarrhea, abdominal pain, fecal incontinence ?Genitourinary:  No dysuria, urinary retention or frequency.  No nocturia. ?Musculoskeletal: Pain all over in muscles, joints and spine ?Integumentary: No rash, pruritus, skin lesions ?Neurological: as above ?Psychiatric: She has depression and anxiety ?Endocrine: No palpitations, diaphoresis, change in appetite, change in weigh or increased thirst ?Hematologic/Lymphatic:  No anemia, purpura, petechiae. ?Allergic/Immunologic: No itchy/runny eyes, nasal congestion, recent allergic reactions, rashes ? ?ALLERGIES: ?No Known Allergies ? ?HOME MEDICATIONS: ? ?Current Outpatient Medications:  ?  Accu-Chek Softclix Lancets lancets, Use as directed up to 4 times daily, Disp: 100 each, Rfl: 5 ?  amantadine (SYMMETREL) 100 MG capsule, Take 1 capsule (100 mg total) by mouth 2 (two) times daily., Disp: 60 capsule, Rfl: 11 ?  amitriptyline (ELAVIL) 25 MG tablet, TAKE 1 TABLET(25 MG) BY MOUTH AT BEDTIME, Disp: 30 tablet, Rfl: 5 ?  amLODipine (NORVASC) 10 MG tablet, Take 1 tablet (10 mg total) by mouth daily., Disp: , Rfl:  ?  atorvastatin (LIPITOR) 80 MG tablet, Take 1 tablet (80 mg total) by mouth daily. (Patient taking differently: Take 80 mg by mouth every evening.), Disp: 90 tablet, Rfl: 3 ?  baclofen (LIORESAL) 20 MG tablet, Take 20 mg by mouth 2 (two) times daily., Disp: , Rfl:  ?  Biotin w/ Vitamins C & E (HAIR/SKIN/NAILS PO), Take 3 tablets by mouth daily., Disp: , Rfl:  ?  Blood Glucose Monitoring Suppl (BLOOD GLUCOSE MONITOR SYSTEM) w/Device KIT, use as directed, Disp: 1  kit, Rfl: 0 ?  busPIRone (BUSPAR) 15 MG tablet, Take 15 mg by mouth 2 (two) times daily., Disp: , Rfl:  ?  Cholecalciferol (VITAMIN D-3) 125 MCG (5000 UT) TABS, Take 5,000 Units by mouth in the morning., Disp: , Rfl:  ?  Dimethyl Fumarate (TECFIDERA) 240 MG CPDR, Take 1 capsule (240 mg total) by mouth 2 (two) times daily., Disp: 180 capsule, Rfl: 3 ?  diphenoxylate-atropine (LOMOTIL) 2.5-0.025 MG tablet, Take 1 tablet by mouth in the morning and at bedtime., Disp: 30 tablet, Rfl: 0 ?  DULoxetine (CYMBALTA) 60 MG capsule, Take 60 mg by mouth 2 (  two) times daily. , Disp: , Rfl:  ?  Eszopiclone (LUNESTA PO), Take 1 tablet by mouth at bedtime., Disp: , Rfl:  ?  fluticasone (FLONASE) 50 MCG/ACT nasal spray, Place 2 sprays into the nose daily. (Patient taking differently: Place 2 sprays into the nose in the morning and at bedtime.), Disp: 16 g, Rfl: 11 ?  gabapentin (NEURONTIN) 400 MG capsule, Take 2 capsules (800 mg total) by mouth 2 (two) times daily., Disp: , Rfl:  ?  glucose blood (ACCU-CHEK GUIDE) test strip, Use as instructed up to 4 times daily, Disp: 100 each, Rfl: 12 ?  Homeopathic Products (LEG CRAMPS PO), Place 3 tablets under the tongue 2 (two) times daily. 3 am tablets and 3 pm tablets., Disp: , Rfl:  ?  hydrALAZINE (APRESOLINE) 50 MG tablet, Take 1 tablet (50 mg total) by mouth every 8 (eight) hours., Disp: , Rfl:  ?  ibuprofen (ADVIL) 800 MG tablet, Take 800 mg by mouth 3 (three) times daily as needed for moderate pain. , Disp: , Rfl:  ?  insulin glargine (LANTUS) 100 UNIT/ML Solostar Pen, Inject 40 Units into the skin daily., Disp: 15 mL, Rfl: 2 ?  insulin lispro (HUMALOG) 100 UNIT/ML KwikPen, Inject 10 Units into the skin 3 (three) times daily with meals. (Patient taking differently: Inject 3-6 Units into the skin 3 (three) times daily with meals. Sliding scale insulin), Disp: 15 mL, Rfl: 2 ?  Insulin Pen Needle 32G X 4 MM MISC, use as directed 4 times daily, Disp: 200 each, Rfl: 2 ?  lamoTRIgine  (LAMICTAL) 200 MG tablet, TAKE 1 TABLET BY MOUTH TWICE DAILY (Patient taking differently: Take 200 mg by mouth 2 (two) times daily.), Disp: 60 tablet, Rfl: 11 ?  loperamide (IMODIUM) 2 MG capsule, Take 1 capsule (2 mg total

## 2021-09-16 NOTE — Progress Notes (Signed)
? ?[facility] ? ?PATIENT: [name] ?DOB: Sep 29, 1960 ? ?REFERRING DOCTOR OR PCP: Lucky Cowboy, FNP ?SOURCE: Patient, notes from primary care, imaging and lab reports, MRI images personally reviewed. ? ?_________________________________ ? ? ?HISTORICAL ? ?CHIEF COMPLAINT:  ?Chief Complaint  ?Patient presents with  ? Follow-up  ?  Rm 1, alone. Here to f/u from recent hospital visit. Pt continues to have bilateral hand shaking. Pt reports pn and spasms on buttocks.   ? ? ?HISTORY OF PRESENT ILLNESS:  ?[name] is a 61 y.o. woman with multiple sclerosis, LBP and gait disturbance ? ?Update 06/12/2021: ?She is on Tecficdera and tolerates it well..  No definite exacerbation but she feels her MS is doing worse and she has more falls and worse balance.   She uses her cane and can go 20-30 feet on a good day but less on many days.     The left leg buckles and she falls Specifically she feels weaker and has had some stumbles/falls.   She has had some mild injuries with falls.   She has not used her walker much as she prefers the cane.   She is having some difficulty doing activities of daily living.   We started dalfampridine --- she does not think she is getting a benefit and we discussed stopping it and trying amantadine which sometimes helps gait     ? ?Due to worsening last year, we rechecked MRI nrain and cervical spine --- no changes.  Differential 02/2021 showed lymphocytes = 2.8.   LFTs fine.   ? ?She has a burning dysesthetic pain in her arms and legs.   She is on lamotrigine as well for pain.   Gabapentin had not helped and she gained weight on it .  Her vision is doing about the same.  She has urinary urgency and frequency. ? ?She also has LBP and sees Dr. Christoper Fabian for pain management and is on  oxycodone 15 mg q6  She had MBBB/RFA helped the right side but not the left side.  Surgery has been discussed with her.   She is also on tizanidine, baclofen and amitriptyline to help  her pain.   She sees Dr. Maia Petties.    Pain is all over.   ? ?Mood is much worse and she is more anxious and depressed,    She is on Cymbalta 60 mg po qd and lamotrigine 200 mg po bid.      ? ?She has never had seizures.   Kidney function is normal (creatinine = 0.59 and 0.65 in 2022).   Ampyra had not helped gait ? ?She has sleep maintenance more than sleep onset insomnia.  She is on eszopiclone   She sees [facility] for Pain Management and other controlled substances. ? ? ?MS HISTORY ?She was diagnosed with MS in 2008 after presenting with leg weakness and headache, neck pain, back pain.  MRIs showed foci in her cervical spine and brain.   She was started on Rebif.   Initially she did well but she reports having a lot of relapses.   She was switched to Copaxone but also had more symptoms.   She was then switched to Tysabri for several years but reports having several relapses.   She switched to Tecfidera 5 years ago.   She reports having a flare in 2015 and had 5 days of IV Solu-medrol.   July 2021, she was complaining of blurry vision, stiff neck and increased burning and  tingling a few weeks ago.  She went to the ED and had MRIs performed showing no new lesions.   ? ?IMAGING: ?MRI of the cervical spine 11/18/2019 and 03/02/2012 showed two T2 hyperintense foci adjacent to C2 and C3-C4.  There was mild multilevel degenerative changes as well.   ? ?MRI of the brain 11/18/2019 and 03/02/2020 show a fairly low plaque burden of T2/FLAIR hyperintense foci.  Most of them are nonspecific.  A few are periventricular.  None acute and no change over time. ? ?MRI of the cervical spine 02/18/2021 showed no new lesions an stable DJD with C5C6 and C6C7 mild spinal senosis ? ?MRI of the brain 02/18/2021 showed no change compared to 11/18/2019 ? ?REVIEW OF SYSTEMS: ?Constitutional: No fevers, chills, sweats, or change in appetite.  She has fatigue and she reports insomnia ?Eyes: No visual changes, double vision, eye pain ?Ear,  nose and throat: No hearing loss, ear pain, nasal congestion, sore throat ?Cardiovascular: No chest pain, palpitations ?Respiratory:  No shortness of breath at rest or with exertion.   No wheezes ?GastrointestinaI: No nausea, vomiting, diarrhea, abdominal pain, fecal incontinence ?Genitourinary:  No dysuria, urinary retention or frequency.  No nocturia. ?Musculoskeletal: Pain all over in muscles, joints and spine ?Integumentary: No rash, pruritus, skin lesions ?Neurological: as above ?Psychiatric: She has depression and anxiety ?Endocrine: No palpitations, diaphoresis, change in appetite, change in weigh or increased thirst ?Hematologic/Lymphatic:  No anemia, purpura, petechiae. ?Allergic/Immunologic: No itchy/runny eyes, nasal congestion, recent allergic reactions, rashes ? ?ALLERGIES: ?No Known Allergies ? ?HOME MEDICATIONS: ? ?Current Outpatient Medications:  ?  Accu-Chek Softclix Lancets lancets, Use as directed up to 4 times daily, Disp: 100 each, Rfl: 5 ?  amantadine (SYMMETREL) 100 MG capsule, Take 1 capsule (100 mg total) by mouth 2 (two) times daily., Disp: 60 capsule, Rfl: 11 ?  amitriptyline (ELAVIL) 25 MG tablet, TAKE 1 TABLET(25 MG) BY MOUTH AT BEDTIME, Disp: 30 tablet, Rfl: 5 ?  amLODipine (NORVASC) 10 MG tablet, Take 1 tablet (10 mg total) by mouth daily., Disp: , Rfl:  ?  atorvastatin (LIPITOR) 80 MG tablet, Take 1 tablet (80 mg total) by mouth daily. (Patient taking differently: Take 80 mg by mouth every evening.), Disp: 90 tablet, Rfl: 3 ?  baclofen (LIORESAL) 20 MG tablet, Take 20 mg by mouth 2 (two) times daily., Disp: , Rfl:  ?  Biotin w/ Vitamins C & E (HAIR/SKIN/NAILS PO), Take 3 tablets by mouth daily., Disp: , Rfl:  ?  Blood Glucose Monitoring Suppl (BLOOD GLUCOSE MONITOR SYSTEM) w/Device KIT, use as directed, Disp: 1 kit, Rfl: 0 ?  busPIRone (BUSPAR) 15 MG tablet, Take 15 mg by mouth 2 (two) times daily., Disp: , Rfl:  ?  Cholecalciferol (VITAMIN D-3) 125 MCG (5000 UT) TABS, Take 5,000  Units by mouth in the morning., Disp: , Rfl:  ?  Dimethyl Fumarate (TECFIDERA) 240 MG CPDR, Take 1 capsule (240 mg total) by mouth 2 (two) times daily., Disp: 180 capsule, Rfl: 3 ?  diphenoxylate-atropine (LOMOTIL) 2.5-0.025 MG tablet, Take 1 tablet by mouth in the morning and at bedtime., Disp: 30 tablet, Rfl: 0 ?  DULoxetine (CYMBALTA) 60 MG capsule, Take 60 mg by mouth 2 (two) times daily. , Disp: , Rfl:  ?  Eszopiclone (LUNESTA PO), Take 1 tablet by mouth at bedtime., Disp: , Rfl:  ?  fluticasone (FLONASE) 50 MCG/ACT nasal spray, Place 2 sprays into the nose daily. (Patient taking differently: Place 2 sprays into the nose in the morning  and at bedtime.), Disp: 16 g, Rfl: 11 ?  gabapentin (NEURONTIN) 400 MG capsule, Take 2 capsules (800 mg total) by mouth 2 (two) times daily., Disp: , Rfl:  ?  glucose blood (ACCU-CHEK GUIDE) test strip, Use as instructed up to 4 times daily, Disp: 100 each, Rfl: 12 ?  Homeopathic Products (LEG CRAMPS PO), Place 3 tablets under the tongue 2 (two) times daily. 3 am tablets and 3 pm tablets., Disp: , Rfl:  ?  hydrALAZINE (APRESOLINE) 50 MG tablet, Take 1 tablet (50 mg total) by mouth every 8 (eight) hours., Disp: , Rfl:  ?  ibuprofen (ADVIL) 800 MG tablet, Take 800 mg by mouth 3 (three) times daily as needed for moderate pain. , Disp: , Rfl:  ?  insulin glargine (LANTUS) 100 UNIT/ML Solostar Pen, Inject 40 Units into the skin daily., Disp: 15 mL, Rfl: 2 ?  insulin lispro (HUMALOG) 100 UNIT/ML KwikPen, Inject 10 Units into the skin 3 (three) times daily with meals. (Patient taking differently: Inject 3-6 Units into the skin 3 (three) times daily with meals. Sliding scale insulin), Disp: 15 mL, Rfl: 2 ?  Insulin Pen Needle 32G X 4 MM MISC, use as directed 4 times daily, Disp: 200 each, Rfl: 2 ?  lamoTRIgine (LAMICTAL) 200 MG tablet, TAKE 1 TABLET BY MOUTH TWICE DAILY (Patient taking differently: Take 200 mg by mouth 2 (two) times daily.), Disp: 60 tablet, Rfl: 11 ?  loperamide  (IMODIUM) 2 MG capsule, Take 1 capsule (2 mg total) by mouth 2 (two) times daily., Disp: 30 capsule, Rfl: 0 ?  losartan (COZAAR) 100 MG tablet, Take 1 tablet (100 mg total) by mouth daily., Disp: 90 tablet, Rfl: 3

## 2021-09-17 LAB — CBC WITH DIFFERENTIAL/PLATELET
Basophils Absolute: 0 10*3/uL (ref 0.0–0.2)
Basos: 1 %
EOS (ABSOLUTE): 0.1 10*3/uL (ref 0.0–0.4)
Eos: 2 %
Hematocrit: 40.9 % (ref 34.0–46.6)
Hemoglobin: 14.1 g/dL (ref 11.1–15.9)
Immature Grans (Abs): 0 10*3/uL (ref 0.0–0.1)
Immature Granulocytes: 1 %
Lymphocytes Absolute: 2.5 10*3/uL (ref 0.7–3.1)
Lymphs: 44 %
MCH: 30.3 pg (ref 26.6–33.0)
MCHC: 34.5 g/dL (ref 31.5–35.7)
MCV: 88 fL (ref 79–97)
Monocytes Absolute: 0.5 10*3/uL (ref 0.1–0.9)
Monocytes: 8 %
Neutrophils Absolute: 2.6 10*3/uL (ref 1.4–7.0)
Neutrophils: 44 %
Platelets: 256 10*3/uL (ref 150–450)
RBC: 4.66 x10E6/uL (ref 3.77–5.28)
RDW: 13.5 % (ref 11.7–15.4)
WBC: 5.8 10*3/uL (ref 3.4–10.8)

## 2021-09-19 ENCOUNTER — Telehealth: Payer: Self-pay | Admitting: Neurology

## 2021-09-19 DIAGNOSIS — R269 Unspecified abnormalities of gait and mobility: Secondary | ICD-10-CM

## 2021-09-19 DIAGNOSIS — M47816 Spondylosis without myelopathy or radiculopathy, lumbar region: Secondary | ICD-10-CM

## 2021-09-19 DIAGNOSIS — R208 Other disturbances of skin sensation: Secondary | ICD-10-CM

## 2021-09-19 DIAGNOSIS — M79604 Pain in right leg: Secondary | ICD-10-CM

## 2021-09-19 DIAGNOSIS — G8929 Other chronic pain: Secondary | ICD-10-CM

## 2021-09-19 DIAGNOSIS — G35 Multiple sclerosis: Secondary | ICD-10-CM

## 2021-09-19 NOTE — Telephone Encounter (Signed)
Pt called stating that she would like an order for a bath chair with arms. Please advise. ?

## 2021-09-22 NOTE — Telephone Encounter (Signed)
Called pt. Ordered placed. She has no preference on which DME to use. I sent community message to [employer] that order placed. Pt will f/u with [employer]. Nothing further needed. ?

## 2021-09-23 ENCOUNTER — Telehealth: Payer: Self-pay | Admitting: *Deleted

## 2021-09-23 NOTE — Telephone Encounter (Signed)
JCV ab drawn on 09/16/21 positive, index: 2.27. Gave to MD to review.  ? ?Looks like MD plan was to change pt from [place] from [place]. Due to JCV ab being positive, other therapy options will need to be discussed.  ?

## 2021-10-07 ENCOUNTER — Ambulatory Visit
Admission: RE | Admit: 2021-10-07 | Discharge: 2021-10-07 | Disposition: A | Discharge status: Home / Self Care | Payer: Medicare Other | Source: Ambulatory Visit | Attending: Endocrinology | Admitting: Endocrinology

## 2021-10-07 DIAGNOSIS — Z1231 Encounter for screening mammogram for malignant neoplasm of breast: Secondary | ICD-10-CM

## 2021-10-16 ENCOUNTER — Telehealth: Payer: Self-pay | Admitting: Neurology

## 2021-10-16 ENCOUNTER — Other Ambulatory Visit: Payer: Self-pay | Admitting: Neurology

## 2021-10-16 DIAGNOSIS — G35 Multiple sclerosis: Secondary | ICD-10-CM

## 2021-10-16 DIAGNOSIS — G250 Essential tremor: Secondary | ICD-10-CM

## 2021-10-16 DIAGNOSIS — Z79899 Other long term (current) drug therapy: Secondary | ICD-10-CM

## 2021-10-16 NOTE — Telephone Encounter (Signed)
Called the patient advised that order was sent earlier in May to [facility]. I have forwarded a message to them to check on the status for the pt.  Pt also was inquiring about whether she could go back to iv med. It appears that JCV was positive and she would not be a candidate to go back on tysabri. Pt would like to me to ask Dr Epimenio Foot is there an alternative option for her they he may find beneficial. Pt is made aware that it will be early next week before likely getting a response. She was appreciative for the call back.

## 2021-10-16 NOTE — Telephone Encounter (Signed)
Pt following up if neurologist has sent referral for shower chair. Also want to know if able to take IV medication for MS.

## 2021-10-20 NOTE — Telephone Encounter (Signed)
I called patient.  I discussed Dr. Bonnita Hollow recommendations of starting either Ocrevus or Briumvi  Patient is agreeable to this.  However, patient would like to come in to see Dr. Epimenio Foot.  She reports worsening bilateral hand numbness/tingling/tremors.  She reports that the lamotrigine may or may not be helping.  I advised her that I will speak with him regarding this and I will call her back.

## 2021-10-20 NOTE — Telephone Encounter (Signed)
I spoke with Dr. Epimenio Foot.  He would like to add on a thyroid panel with TSH lab when she comes in for her labs prior to starting an anti-CD 20 agent.  I called patient.  I advised her of this.  Patient is agreeable to this plan.  Once Dr. Epimenio Foot reviews her lab work we will discuss medications and what his plan is regarding her worsening bilateral hand symptoms.  Patient plans on coming Thursday around 11am for lab work.  I have placed the start form for Briumvi and Ocrevus at the front desk.  Patient understands that she needs to sign both of the start forms while she is here for lab work.

## 2021-10-20 NOTE — Telephone Encounter (Signed)
Per Dr. Epimenio Foot: "We can place her on one of the anti-CD20 agents (Ocrevus or Briumvi)   I put in orders for lab work and she can stop by the office sometimes next week.  When she does, she can sign the forms for Ocrevus and Briumvi and we can try to get her on 1 of these 2. "  I called patient to discuss. No answer, left a message asking her to call us back.

## 2021-10-20 NOTE — Telephone Encounter (Signed)
LVM at 12:51 pm want Baxter Hire to know tried calling back. Would like a call back.

## 2021-10-21 NOTE — Telephone Encounter (Signed)
Update from Dunn Karrington Studnicka/[facility] on shower chair when I asked if she was able to reach pt: "Yes! She told me to call back on the 17th when she has the means to pay for the equipment."

## 2021-10-23 ENCOUNTER — Other Ambulatory Visit (INDEPENDENT_AMBULATORY_CARE_PROVIDER_SITE_OTHER): Payer: Self-pay

## 2021-10-23 ENCOUNTER — Other Ambulatory Visit: Payer: Self-pay

## 2021-10-23 DIAGNOSIS — G35 Multiple sclerosis: Secondary | ICD-10-CM

## 2021-10-23 DIAGNOSIS — Z79899 Other long term (current) drug therapy: Secondary | ICD-10-CM

## 2021-10-23 DIAGNOSIS — G250 Essential tremor: Secondary | ICD-10-CM

## 2021-10-23 DIAGNOSIS — Z0289 Encounter for other administrative examinations: Secondary | ICD-10-CM

## 2021-10-27 ENCOUNTER — Other Ambulatory Visit: Payer: Self-pay | Admitting: Neurology

## 2021-10-27 ENCOUNTER — Telehealth: Payer: Self-pay

## 2021-10-27 DIAGNOSIS — R768 Other specified abnormal immunological findings in serum: Secondary | ICD-10-CM

## 2021-10-27 LAB — CBC WITH DIFFERENTIAL/PLATELET
Basophils Absolute: 0 10*3/uL (ref 0.0–0.2)
Basos: 0 %
EOS (ABSOLUTE): 0.1 10*3/uL (ref 0.0–0.4)
Eos: 1 %
Hematocrit: 40.9 % (ref 34.0–46.6)
Hemoglobin: 13.8 g/dL (ref 11.1–15.9)
Immature Grans (Abs): 0 10*3/uL (ref 0.0–0.1)
Immature Granulocytes: 0 %
Lymphocytes Absolute: 2.3 10*3/uL (ref 0.7–3.1)
Lymphs: 32 %
MCH: 29.5 pg (ref 26.6–33.0)
MCHC: 33.7 g/dL (ref 31.5–35.7)
MCV: 87 fL (ref 79–97)
Monocytes Absolute: 0.6 10*3/uL (ref 0.1–0.9)
Monocytes: 9 %
Neutrophils Absolute: 4 10*3/uL (ref 1.4–7.0)
Neutrophils: 58 %
Platelets: 248 10*3/uL (ref 150–450)
RBC: 4.68 x10E6/uL (ref 3.77–5.28)
RDW: 13.3 % (ref 11.7–15.4)
WBC: 7 10*3/uL (ref 3.4–10.8)

## 2021-10-27 LAB — HEPATITIS B SURFACE ANTIBODY,QUALITATIVE: Hep B Surface Ab, Qual: NONREACTIVE

## 2021-10-27 LAB — QUANTIFERON-TB GOLD PLUS
QuantiFERON Mitogen Value: >10 IU/mL
QuantiFERON Nil Value: 0 IU/mL
QuantiFERON TB1 Ag Value: 0 IU/mL
QuantiFERON TB2 Ag Value: 0 IU/mL
QuantiFERON-TB Gold Plus: NEGATIVE

## 2021-10-27 LAB — HEPATIC FUNCTION PANEL
ALT: 15 IU/L (ref 0–32)
AST: 15 IU/L (ref 0–40)
Albumin: 4.7 g/dL (ref 3.8–4.8)
Alkaline Phosphatase: 77 IU/L (ref 44–121)
Bilirubin Total: 0.3 mg/dL (ref 0.0–1.2)
Bilirubin, Direct: 0.12 mg/dL (ref 0.00–0.40)
Total Protein: 7.2 g/dL (ref 6.0–8.5)

## 2021-10-27 LAB — IGG, IGA, IGM
IgA/Immunoglobulin A, Serum: 312 mg/dL (ref 87–352)
IgG (Immunoglobin G), Serum: 850 mg/dL (ref 586–1602)
IgM (Immunoglobulin M), Srm: 166 mg/dL (ref 26–217)

## 2021-10-27 LAB — HEPATITIS B SURFACE ANTIGEN: Hepatitis B Surface Ag: NEGATIVE

## 2021-10-27 LAB — THYROID PANEL WITH TSH
Free Thyroxine Index: 1.6 (ref 1.2–4.9)
T3 Uptake Ratio: 22 % — ABNORMAL LOW (ref 24–39)
T4, Total: 7.3 ug/dL (ref 4.5–12.0)
TSH: 1.08 u[IU]/mL (ref 0.450–4.500)

## 2021-10-27 LAB — HEPATITIS C ANTIBODY: Hep C Virus Ab: REACTIVE — AB

## 2021-10-27 LAB — HIV ANTIBODY (ROUTINE TESTING W REFLEX): HIV Screen 4th Generation wRfx: NONREACTIVE

## 2021-10-27 LAB — VARICELLA ZOSTER ANTIBODY, IGG: Varicella zoster IgG: 1758 index (ref >165)

## 2021-10-27 LAB — HEPATITIS B CORE ANTIBODY, TOTAL: Hep B Core Total Ab: NEGATIVE

## 2021-10-27 NOTE — Telephone Encounter (Signed)
I called patient.  I discussed her lab work.  She reports that she has a history of hepatitis C and was treated with an antiviral and was told that her hepatitis C was cured.  She would rather not perform any further blood work on this.  I spoke with Dr. Epimenio Foot.  He would rather complete the hepatitis C RNA lab work to make sure she is okay to start Ocrevus.  I called patient.  She is agreeable to coming back in for the hepatitis C RNA blood work.  She will come next week.

## 2021-10-27 NOTE — Telephone Encounter (Signed)
-----   Message from Asa Lente, MD sent at 10/27/2021  2:09 PM EDT ----- We had discussed an anti-CD20 drug.   Her Hep C Antibody was negative and she will need to get the Hep C RNA lab test (I have placed the order to be done in our office in the future)

## 2021-10-27 NOTE — Progress Notes (Signed)
Hep c

## 2021-10-27 NOTE — H&P (View-Only) (Signed)
Hep c

## 2021-10-31 ENCOUNTER — Encounter: Payer: Self-pay | Admitting: Podiatry

## 2021-10-31 ENCOUNTER — Ambulatory Visit (INDEPENDENT_AMBULATORY_CARE_PROVIDER_SITE_OTHER): Payer: Medicare Other | Admitting: Podiatry

## 2021-10-31 DIAGNOSIS — R197 Diarrhea, unspecified: Secondary | ICD-10-CM | POA: Insufficient documentation

## 2021-10-31 DIAGNOSIS — K580 Irritable bowel syndrome with diarrhea: Secondary | ICD-10-CM | POA: Insufficient documentation

## 2021-10-31 DIAGNOSIS — E1149 Type 2 diabetes mellitus with other diabetic neurological complication: Secondary | ICD-10-CM

## 2021-10-31 DIAGNOSIS — R140 Abdominal distension (gaseous): Secondary | ICD-10-CM | POA: Insufficient documentation

## 2021-10-31 DIAGNOSIS — B182 Chronic viral hepatitis C: Secondary | ICD-10-CM | POA: Insufficient documentation

## 2021-10-31 DIAGNOSIS — D509 Iron deficiency anemia, unspecified: Secondary | ICD-10-CM | POA: Insufficient documentation

## 2021-10-31 DIAGNOSIS — R159 Full incontinence of feces: Secondary | ICD-10-CM | POA: Insufficient documentation

## 2021-10-31 DIAGNOSIS — L84 Corns and callosities: Secondary | ICD-10-CM

## 2021-10-31 DIAGNOSIS — D649 Anemia, unspecified: Secondary | ICD-10-CM | POA: Insufficient documentation

## 2021-10-31 DIAGNOSIS — Z1159 Encounter for screening for other viral diseases: Secondary | ICD-10-CM | POA: Insufficient documentation

## 2021-10-31 DIAGNOSIS — E1140 Type 2 diabetes mellitus with diabetic neuropathy, unspecified: Secondary | ICD-10-CM

## 2021-10-31 NOTE — Progress Notes (Signed)
Subjective:   Patient ID: [name], female   DOB: 61 y.o.   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

## 2021-11-03 ENCOUNTER — Ambulatory Visit (INDEPENDENT_AMBULATORY_CARE_PROVIDER_SITE_OTHER): Payer: Medicare Other

## 2021-11-03 ENCOUNTER — Other Ambulatory Visit (INDEPENDENT_AMBULATORY_CARE_PROVIDER_SITE_OTHER): Payer: Self-pay

## 2021-11-03 ENCOUNTER — Encounter (HOSPITAL_COMMUNITY): Payer: Self-pay | Admitting: Emergency Medicine

## 2021-11-03 ENCOUNTER — Other Ambulatory Visit: Payer: Self-pay

## 2021-11-03 ENCOUNTER — Ambulatory Visit (HOSPITAL_COMMUNITY)
Admission: EM | Admit: 2021-11-03 | Discharge: 2021-11-03 | Disposition: A | Discharge status: Home / Self Care | Payer: Medicare Other | Attending: Family Medicine | Admitting: Family Medicine

## 2021-11-03 DIAGNOSIS — Z0289 Encounter for other administrative examinations: Secondary | ICD-10-CM

## 2021-11-03 DIAGNOSIS — S82832A Other fracture of upper and lower end of left fibula, initial encounter for closed fracture: Secondary | ICD-10-CM

## 2021-11-03 DIAGNOSIS — R768 Other specified abnormal immunological findings in serum: Secondary | ICD-10-CM

## 2021-11-03 NOTE — Discharge Instructions (Addendum)
Do your best to not bear weight on your left foot until you see the surgeon.

## 2021-11-03 NOTE — ED Triage Notes (Signed)
Pt is present today with left ankle pain from a fall that occurred Friday. Pt states that she tripped and fell, denies LOC or hitting her head.

## 2021-11-04 LAB — HCV RNA QUANT: Hepatitis C Quantitation: NOT DETECTED IU/mL

## 2021-11-04 NOTE — Telephone Encounter (Signed)
-----   Message from Asa Lente, MD sent at 11/04/2021  5:24 PM EDT ----- The hepatitis C RNA came back negative.  Therefore, she can go ahead and start Ocrevus.

## 2021-11-04 NOTE — Telephone Encounter (Signed)
I called patient. I advised her of the hep C RNA being negative. Will begin processing Ocrevus. Patient will need to stop Tecfidera once she is scheduled for her Ocrevus infusion.

## 2021-11-05 NOTE — ED Provider Notes (Signed)
[facility]   [id number] 11/03/21 Arrival Time: 1114  ASSESSMENT & PLAN:  1. Other closed fracture of distal end of left fibula, initial encounter    I have personally viewed the imaging studies ordered this visit. Distal LEFT fibula fracture; mild displacement.   Prefers OTC analgesics. CAM boot to remain non-wt bearing as much as possible.   Recommend:  Follow-up Information     Schedule an appointment as soon as possible for a visit  with [facility]).   Contact information: [address] [place],  [place]  [address]  [phone]                 Reviewed expectations re: course of current medical issues. Questions answered. Outlined signs and symptoms indicating need for more acute intervention. Patient verbalized understanding. After Visit Summary given.  SUBJECTIVE: History from: patient. [name] is a 61 y.o. female who reports LEFT ankle pain s/p fall 3 d ago. Has been able to br weight but with pain. Cont swelling. No extremity sensation changes or weakness.    Past Surgical History:  Procedure Laterality Date   ABDOMINAL HYSTERECTOMY     partial   I & D EXTREMITY Right 12/14/2020   Procedure: IRRIGATION AND DEBRIDEMENT RIGHT LOWER EXTREMITY;  Surgeon: Samson Frederic, MD;  Location: [facility] OR;  Service: Orthopedics;  Laterality: Right;   JOINT REPLACEMENT     LTK   RADIOLOGY WITH ANESTHESIA N/A 02/18/2021   Procedure: MRI WITH ANESTHESIA CERVICAL SPINE WITH AND WITHOUT CONTRAST AND BRAIN WITH AND WITHOUT CONTRAST;  Surgeon: Radiologist, Medication, MD;  Location: [facility] OR;  Service: Radiology;  Laterality: N/A;   RADIOLOGY WITH ANESTHESIA N/A 07/07/2021   Procedure: MRI WITH ANESTHESIA;  Surgeon: Radiologist, Medication, MD;  Location: [facility] OR;  Service: Radiology;  Laterality: N/A;   SACRAL NERVE STIMULATOR PLACEMENT     [employer]   TUBAL LIGATION        OBJECTIVE:  Vitals:   11/03/21 1315  BP: 129/74   Pulse: 95  Resp: 18  Temp: 99.5 F (37.5 C)  SpO2: 95%    General appearance: alert; no distress HEENT: [place]; AT Neck: supple with FROM Resp: unlabored respirations Extremities: LLE: warm with well perfused appearance; fairly well localized moderate tenderness over LLE just above lateral ankle; without gross deformities; swelling: minimal; bruising: none; ankle ROM: limited by reported pain CV: brisk extremity capillary refill of LLE; 2+ DP pulse of LLE. Skin: warm and dry; no visible rashes Neurologic: normal sensation and strength of LLE Psychological: alert and cooperative; normal mood and affect  Imaging: DG Ankle Complete Left  Result Date: 11/03/2021 CLINICAL DATA:  Provided history: Swelling. Additional history provided: Left ankle pain status post fall which occurred Friday night. EXAM: LEFT ANKLE COMPLETE - 3+ VIEW COMPARISON:  Ankle radiographs 03/26/2010 (images available, report unavailable). FINDINGS: There is an acute, mildly displaced fracture of the distal fibular metaphysis. Additionally, there is abnormal widening of the medial clear space to 3-4 mm, suggesting instability. Soft tissue swelling about the ankle and proximal foot. Plantar calcaneal spurring. IMPRESSION: Acute, mildly displaced fracture of the distal fibular metaphysis. Abnormal widening of the medial clear space to 3-4 mm, suggesting instability. Overlying soft tissue swelling about the ankle and proximal foot. Electronically Signed   By: Jackey Loge D.O.   On: 11/03/2021 13:46    No Known Allergies  Past Medical History:  Diagnosis Date   Diabetes mellitus    Hypertension  Multiple sclerosis exacerbation (HCC)    Neuropathy    Social History   Socioeconomic History   Marital status: Single    Spouse name: Not on file   Number of children: 2   Years of education: Not on file   Highest education level: Not on file  Occupational History   Not on file  Tobacco Use   Smoking status: Every Day     Packs/day: 0.25    Years: 12.00    Total pack years: 3.00    Types: Cigarettes   Smokeless tobacco: Never   Tobacco comments:    06/13/20 1 pk per 3 days  Substance and Sexual Activity   Alcohol use: No   Drug use: No   Sexual activity: Not on file  Other Topics Concern   Not on file  Social History Narrative   06/13/20 lives alone, dgtr across the street   Social Determinants of Health   Financial Resource Strain: Not on file  Food Insecurity: Not on file  Transportation Needs: Not on file  Physical Activity: Not on file  Stress: Not on file  Social Connections: Not on file   Family History  Problem Relation Age of Onset   Diabetes Mother    Hypertension Mother    Stroke Father    Past Surgical History:  Procedure Laterality Date   ABDOMINAL HYSTERECTOMY     partial   I & D EXTREMITY Right 12/14/2020   Procedure: IRRIGATION AND DEBRIDEMENT RIGHT LOWER EXTREMITY;  Surgeon: Samson Frederic, MD;  Location: [facility];  Service: Orthopedics;  Laterality: Right;   JOINT REPLACEMENT     LTK   RADIOLOGY WITH ANESTHESIA N/A 02/18/2021   Procedure: MRI WITH ANESTHESIA CERVICAL SPINE WITH AND WITHOUT CONTRAST AND BRAIN WITH AND WITHOUT CONTRAST;  Surgeon: Radiologist, Medication, MD;  Location: [facility];  Service: Radiology;  Laterality: N/A;   RADIOLOGY WITH ANESTHESIA N/A 07/07/2021   Procedure: MRI WITH ANESTHESIA;  Surgeon: Radiologist, Medication, MD;  Location: [facility];  Service: Radiology;  Laterality: N/A;   SACRAL NERVE STIMULATOR PLACEMENT     [employer]   TUBAL LIGATION         Mardella Layman, MD 11/05/21 [phone]

## 2021-11-07 ENCOUNTER — Ambulatory Visit (INDEPENDENT_AMBULATORY_CARE_PROVIDER_SITE_OTHER): Payer: Medicare Other | Admitting: Podiatry

## 2021-11-07 DIAGNOSIS — Z01818 Encounter for other preprocedural examination: Secondary | ICD-10-CM

## 2021-11-07 DIAGNOSIS — S93432A Sprain of tibiofibular ligament of left ankle, initial encounter: Secondary | ICD-10-CM

## 2021-11-07 DIAGNOSIS — S82892A Other fracture of left lower leg, initial encounter for closed fracture: Secondary | ICD-10-CM

## 2021-11-10 NOTE — Telephone Encounter (Signed)
I spoke with Dr. Epimenio Foot. He would like a 3 week washout of Tecfidera prior to starting Ocrevus.  I called patient. I explained this to her in great detail. She verbalized understand that she will need to be off Tecfidera 3 weeks prior to starting Ocrevus. Make the Ocrevus appointment and then stop Tecfidera 3 weeks prior to that appt.  Patient reports that she has a broken ankle and is having surgery on 11/25/21. I will inform Dr. Epimenio Foot.

## 2021-11-12 NOTE — Progress Notes (Signed)
Subjective:  Patient ID: [name], female    DOB: [birth year],  MRN: [id number]  No chief complaint on file.   61 y.o. female presents with the above complaint.  Patient presents with complaint of left ankle pain.  She states that she tripped and fell and sustained an ankle fracture.  She states the injury happened on last Saturday, 11/01/2021.  She states it hurts to ambulate on.  She has not seen anyone else prior to seeing me.  She was referred by urgent care for further evaluation and management.  Is been on for 6 days she denies any other acute issues.  She would like to discuss treatment options for this.  She also has history of multiple sclerosis and diabetes with last A1c of 5.5   Review of Systems: Negative except as noted in the HPI. Denies N/V/F/Ch.  Past Medical History:  Diagnosis Date   Diabetes mellitus    Hypertension    Multiple sclerosis exacerbation (HCC)    Neuropathy     Current Outpatient Medications:    Accu-Chek Softclix Lancets lancets, Use as directed up to 4 times daily, Disp: 100 each, Rfl: 5   amantadine (SYMMETREL) 100 MG capsule, Take 1 capsule (100 mg total) by mouth 2 (two) times daily., Disp: 60 capsule, Rfl: 11   amitriptyline (ELAVIL) 25 MG tablet, TAKE 1 TABLET(25 MG) BY MOUTH AT BEDTIME, Disp: 30 tablet, Rfl: 5   amLODipine (NORVASC) 10 MG tablet, Take 1 tablet (10 mg total) by mouth daily., Disp: , Rfl:    atorvastatin (LIPITOR) 80 MG tablet, Take 1 tablet (80 mg total) by mouth daily. (Patient taking differently: Take 80 mg by mouth every evening.), Disp: 90 tablet, Rfl: 3   baclofen (LIORESAL) 20 MG tablet, Take 20 mg by mouth 2 (two) times daily., Disp: , Rfl:    Biotin w/ Vitamins C & E (HAIR/SKIN/NAILS PO), Take 3 tablets by mouth daily., Disp: , Rfl:    Blood Glucose Monitoring Suppl (BLOOD GLUCOSE MONITOR SYSTEM) w/Device KIT, use as directed, Disp: 1 kit, Rfl: 0   busPIRone (BUSPAR) 15 MG tablet, Take 15 mg by mouth 2 (two) times daily.,  Disp: , Rfl:    Cholecalciferol (VITAMIN D-3) 125 MCG (5000 UT) TABS, Take 5,000 Units by mouth in the morning., Disp: , Rfl:    ciprofloxacin (CIPRO) 500 MG tablet, 1 tablet Orally every 12 hrs for 10 day(s), Disp: , Rfl:    dicyclomine (BENTYL) 20 MG tablet, 1 tablet Orally Four times a day for 90 days, Disp: , Rfl:    Dimethyl Fumarate (TECFIDERA) 240 MG CPDR, Take 1 capsule (240 mg total) by mouth 2 (two) times daily., Disp: 180 capsule, Rfl: 3   diphenoxylate-atropine (LOMOTIL) 2.5-0.025 MG tablet, Take 1 tablet by mouth in the morning and at bedtime., Disp: 30 tablet, Rfl: 0   doxepin (SINEQUAN) 10 MG capsule, 1 capsule at bedtime Orally Once a day for 30 day(s), Disp: , Rfl:    DULoxetine (CYMBALTA) 30 MG capsule, 1 capsule Orally Once a day, Disp: , Rfl:    Eszopiclone (LUNESTA PO), Take 1 tablet by mouth at bedtime., Disp: , Rfl:    famotidine (PEPCID) 20 MG tablet, 2 tablet Orally bid for 90, Disp: , Rfl:    fluticasone (FLONASE) 50 MCG/ACT nasal spray, Place 2 sprays into the nose daily. (Patient taking differently: Place 2 sprays into the nose in the morning and at bedtime.), Disp: 16 g, Rfl: 11   furosemide (LASIX) 20 MG  tablet, Take 20 mg by mouth every morning., Disp: , Rfl:    gabapentin (NEURONTIN) 800 MG tablet, Take 800 mg by mouth 3 (three) times daily., Disp: , Rfl:    glucose blood (ACCU-CHEK GUIDE) test strip, Use as instructed up to 4 times daily, Disp: 100 each, Rfl: 12   Homeopathic Products (LEG CRAMPS PO), Place 3 tablets under the tongue 2 (two) times daily. 3 am tablets and 3 pm tablets., Disp: , Rfl:    hydrALAZINE (APRESOLINE) 50 MG tablet, Take 1 tablet (50 mg total) by mouth every 8 (eight) hours., Disp: , Rfl:    ibuprofen (ADVIL) 800 MG tablet, 1 tablet with food or milk as needed Orally Three times a day, Disp: , Rfl:    insulin glargine (LANTUS) 100 UNIT/ML Solostar Pen, Inject 40 Units into the skin daily., Disp: 15 mL, Rfl: 2   insulin lispro (HUMALOG) 100  UNIT/ML KwikPen, Inject 10 Units into the skin 3 (three) times daily with meals. (Patient taking differently: Inject 3-6 Units into the skin 3 (three) times daily with meals. Sliding scale insulin), Disp: 15 mL, Rfl: 2   Insulin Pen Needle 32G X 4 MM MISC, use as directed 4 times daily, Disp: 200 each, Rfl: 2   lamoTRIgine (LAMICTAL) 200 MG tablet, TAKE 1 TABLET BY MOUTH TWICE DAILY (Patient taking differently: Take 200 mg by mouth 2 (two) times daily.), Disp: 60 tablet, Rfl: 11   lansoprazole (PREVACID) 30 MG capsule, 1 capsule Orally bid, Disp: , Rfl:    loperamide (IMODIUM) 2 MG capsule, Take 1 capsule (2 mg total) by mouth 2 (two) times daily., Disp: 30 capsule, Rfl: 0   losartan (COZAAR) 100 MG tablet, Take 1 tablet (100 mg total) by mouth daily., Disp: 90 tablet, Rfl: 3   MAGNESIUM-OXIDE 400 (240 Mg) MG tablet, Take 1 tablet by mouth daily., Disp: , Rfl:    MELATONIN MAXIMUM STRENGTH 5 MG TABS, Take 5 mg by mouth at bedtime., Disp: , Rfl:    montelukast (SINGULAIR) 10 MG tablet, Take 10 mg by mouth at bedtime as needed (allergy)., Disp: , Rfl:    NARCAN 4 MG/0.1ML LIQD nasal spray kit, Place 1 spray into the nose once as needed (overdose)., Disp: , Rfl:    Oxcarbazepine (TRILEPTAL) 300 MG tablet, Take 1 tablet (300 mg total) by mouth 2 (two) times daily., Disp: 60 tablet, Rfl: 5   oxyCODONE (ROXICODONE) 15 MG immediate release tablet, Take by mouth., Disp: , Rfl:    OZEMPIC, 0.25 OR 0.5 MG/DOSE, 2 MG/1.5ML SOPN, Inject into the skin., Disp: , Rfl:    pantoprazole (PROTONIX) 40 MG tablet, 1 tablet Orally BID for 90 days, Disp: , Rfl:    Sofosbuvir-Velpatasvir (EPCLUSA) 400-100 MG TABS, 1 tablet Orally Once a day, Disp: , Rfl:    tiZANidine (ZANAFLEX) 2 MG tablet, 1 tablet as needed Orally Three times a day, Disp: , Rfl:    topiramate (TOPAMAX) 100 MG tablet, orally, Disp: , Rfl:    triamterene-hydrochlorothiazide (MAXZIDE-25) 37.5-25 MG tablet, Take 1 tablet by mouth daily., Disp: , Rfl:     VIBERZI 100 MG TABS, Take 100 mg by mouth 2 (two) times daily., Disp: , Rfl:    vitamin B-12 (CYANOCOBALAMIN) 1000 MCG tablet, Take 1,000 mcg by mouth in the morning., Disp: , Rfl:    XIIDRA 5 % SOLN, Place 2 drops into both eyes in the morning and at bedtime., Disp: , Rfl:   Social History   Tobacco Use  Smoking Status  Every Day   Packs/day: 0.25   Years: 12.00   Total pack years: 3.00   Types: Cigarettes  Smokeless Tobacco Never  Tobacco Comments   06/13/20 1 pk per 3 days    No Known Allergies Objective:  There were no vitals filed for this visit. There is no height or weight on file to calculate BMI. Constitutional Well developed. Well nourished.  Vascular Dorsalis pedis pulses palpable bilaterally. Posterior tibial pulses palpable bilaterally. Capillary refill normal to all digits.  No cyanosis or clubbing noted. Pedal hair growth normal.  Neurologic Normal speech. Oriented to person, place, and time. Epicritic sensation to light touch grossly present bilaterally.  Dermatologic Nails well groomed and normal in appearance. No open wounds. No skin lesions.  Orthopedic: Pain on palpation to the left ankle.  Pain with range of motion of the ankle joint.  No open wounds or lesion noted.  No open fracture noted.  Exam limited due to pain.   Radiographs: 3 views of skeletally mature adult left ankle:Spiral oblique ankle fracture noted with gapping of the medial malleolus with about 5 mm of gapping noted at the medial clear space.  This is may be causing some instability Assessment:   1. Closed fracture of left ankle, initial encounter   2. Syndesmotic disruption of left ankle, initial encounter   3. Encounter for preoperative examination for general surgical procedure    Plan:  Patient was evaluated and treated and all questions answered.  Left ankle fracture bimalleolar equivalent -All questions and concerns were discussed with the patient in extensive detail -Given that  there is a gapping of the medial clear space I believe patient will benefit from reduction of the fibular fracture with a syndesmotic reduction.  I discussed my preoperative intraoperative postoperative plan in extensive detail she states understanding and would like to proceed with surgery.  She is a high risk given that she is a diabetic however her A1c is well controlled. -I discussed with her she will need to be nonweightbearing to the left side.  She states understanding. -Informed surgical risk consent was reviewed and read aloud to the patient.  I reviewed the films.  I have discussed my findings with the patient in great detail.  I have discussed all risks including but not limited to infection, stiffness, scarring, limp, disability, deformity, damage to blood vessels and nerves, numbness, poor healing, need for braces, arthritis, chronic pain, amputation, death.  All benefits and realistic expectations discussed in great detail.  I have made no promises as to the outcome.  I have provided realistic expectations.  I have offered the patient a 2nd opinion, which they have declined and assured me they preferred to proceed despite the risks   No follow-ups on file.

## 2021-11-13 ENCOUNTER — Telehealth: Payer: Self-pay | Admitting: Urology

## 2021-11-13 NOTE — Telephone Encounter (Signed)
DOS - 11/25/21  Open treatment of trimalleolar ankle fracture left --- 27822 Fracture/ Dislocation Procedures on the Leg (Tibia and Fibula) and Ankle Joint left --- 27829  [facility] EFFECTIVE DATE - 05/18/21  PLAN DEDUCTIBLE - $0.00 OUT OF POCKET - $8,300.00 W/ $5,243.03 REMAINING COINSURANCE - 20% COPAY - $0.00   PER [employer] WEBSITE FOR CPT CODES 71245 AND [phone] Notification or Prior Authorization is not required for the requested services  Decision ID #:[id number]

## 2021-11-14 ENCOUNTER — Telehealth: Payer: Self-pay | Admitting: Podiatry

## 2021-11-14 DIAGNOSIS — S82892A Other fracture of left lower leg, initial encounter for closed fracture: Secondary | ICD-10-CM

## 2021-11-14 NOTE — Telephone Encounter (Signed)
Pt called in and stated that insurance company needs a prior auth in order to pay for her knee scooter. Please advise

## 2021-11-19 ENCOUNTER — Other Ambulatory Visit: Payer: Self-pay | Admitting: Podiatry

## 2021-11-21 ENCOUNTER — Encounter
Admission: RE | Admit: 2021-11-21 | Discharge: 2021-11-21 | Disposition: A | Discharge status: Home / Self Care | Payer: Medicare Other | Source: Ambulatory Visit | Attending: Podiatry | Admitting: Podiatry

## 2021-11-21 VITALS — Ht 63.0 in | Wt 258.0 lb

## 2021-11-21 DIAGNOSIS — Z01818 Encounter for other preprocedural examination: Secondary | ICD-10-CM

## 2021-11-21 HISTORY — DX: Gastro-esophageal reflux disease without esophagitis: K21.9

## 2021-11-21 HISTORY — DX: Presence of neurostimulator: Z96.82

## 2021-11-21 HISTORY — DX: Pure hypercholesterolemia, unspecified: E78.00

## 2021-11-21 HISTORY — DX: Fibromyalgia: M79.7

## 2021-11-21 HISTORY — DX: Unspecified osteoarthritis, unspecified site: M19.90

## 2021-11-21 HISTORY — DX: Anemia, unspecified: D64.9

## 2021-11-21 HISTORY — DX: Personal history of Methicillin resistant Staphylococcus aureus infection: Z86.14

## 2021-11-21 HISTORY — DX: Unspecified viral hepatitis C without hepatic coma: B19.20

## 2021-11-21 HISTORY — DX: Obesity, unspecified: E66.9

## 2021-11-21 HISTORY — DX: Tachycardia, unspecified: R00.0

## 2021-11-21 HISTORY — DX: Metabolic encephalopathy: G93.41

## 2021-11-21 HISTORY — DX: Neuromuscular dysfunction of bladder, unspecified: N31.9

## 2021-11-21 HISTORY — DX: Cerebral infarction, unspecified: I63.9

## 2021-11-21 HISTORY — DX: Other chronic pain: G89.29

## 2021-11-21 HISTORY — DX: Acute cystitis without hematuria: N30.00

## 2021-11-21 HISTORY — DX: Urinary tract infection, site not specified: N39.0

## 2021-11-21 HISTORY — DX: Vitamin D deficiency, unspecified: E55.9

## 2021-11-21 HISTORY — DX: Irritable bowel syndrome, unspecified: K58.9

## 2021-11-21 NOTE — Patient Instructions (Signed)
Your procedure is scheduled on:11-25-21 Tuesday Report to the Registration Desk on the 1st floor of the [facility].Then proceed to the 2nd floor Surgery Desk To find out your arrival time, please call [phone] between 1PM - 3PM on:11-24-21 Monday If your arrival time is 6:00 am, do not arrive prior to that time as the [facility] entrance doors do not open until 6:00 am.  REMEMBER: Instructions that are not followed completely may result in serious medical risk, up to and including death; or upon the discretion of your surgeon and anesthesiologist your surgery may need to be rescheduled.  Do not eat food after midnight the night before surgery.  No gum chewing, lozengers or hard candies.  You may however, drink Water up to 2 hours before you are scheduled to arrive for your surgery. Do not drink anything within 2 hours of your scheduled arrival time.  Type 1 and Type 2 diabetics should only drink water.  TAKE THESE MEDICATIONS THE MORNING OF SURGERY WITH A SIP OF WATER: -amantadine (SYMMETREL)  -baclofen (LIORESAL) -dicyclomine (BENTYL)  -Dimethyl Fumarate (TECFIDERA) -DULoxetine (CYMBALTA) -famotidine (PEPCID) -hydrALAZINE (APRESOLINE)  -lamoTRIgine (LAMICTAL)  -lansoprazole (PREVACID) -Oxcarbazepine (TRILEPTAL) -oxyCODONE (ROXICODONE)  -pantoprazole (PROTONIX)  -VIBERZI  Do NOT take any Insulin the morning of surgery  One week prior to surgery: Stop Anti-inflammatories (NSAIDS) such as Advil, Aleve, Ibuprofen, Motrin, Naproxen, Naprosyn and Aspirin based products such as Excedrin, Goodys Powder, BC Powder.You may however, take Tylenol/Oxycodone if needed for pain up until the day of surgery.  Stop ANY OVER THE COUNTER supplements/vitamins NOW (11-21-21) until after surgery (Hair,Skin and nails, Vitamin D, Leg cramps, Magnesium, Vitamin B12) You may continue your Melatonin up until the night prior to surgery  No Alcohol for 24 hours before or after surgery.  No Smoking  including e-cigarettes for 24 hours prior to surgery.  No chewable tobacco products for at least 6 hours prior to surgery.  No nicotine patches on the day of surgery.  Do not use any "recreational" drugs for at least a week prior to your surgery.  Please be advised that the combination of cocaine and anesthesia may have negative outcomes, up to and including death. If you test positive for cocaine, your surgery will be cancelled.  On the morning of surgery brush your teeth with toothpaste and water, you may rinse your mouth with mouthwash if you wish. Do not swallow any toothpaste or mouthwash.  Do not wear jewelry, make-up, hairpins, clips or nail polish.  Do not wear lotions, powders, or perfumes.   Do not shave body from the neck down 48 hours prior to surgery just in case you cut yourself which could leave a site for infection.  Also, freshly shaved skin may become irritated if using the CHG soap.  Contact lenses, hearing aids and dentures may not be worn into surgery.  Do not bring valuables to the hospital. [facility] is not responsible for any missing/lost belongings or valuables.   Notify your doctor if there is any change in your medical condition (cold, fever, infection).  Wear comfortable clothing (specific to your surgery type) to the hospital.  After surgery, you can help prevent lung complications by doing breathing exercises.  Take deep breaths and cough every 1-2 hours. Your doctor may order a device called an Incentive Spirometer to help you take deep breaths. When coughing or sneezing, hold a pillow firmly against your incision with both hands. This is called "splinting." Doing this helps protect your incision. It  also decreases belly discomfort.  If you are being admitted to the hospital overnight, leave your suitcase in the car. After surgery it may be brought to your room.  If you are being discharged the day of surgery, you will not be allowed to drive  home. You will need a responsible adult (18 years or older) to drive you home and stay with you that night.   If you are taking public transportation, you will need to have a responsible adult (18 years or older) with you. Please confirm with your physician that it is acceptable to use public transportation.   Please call the Pre-admissions Testing Dept. at [phone] if you have any questions about these instructions.  Surgery Visitation Policy:  Patients undergoing a surgery or procedure may have two family members or support persons with them as long as the person is not COVID-19 positive or experiencing its symptoms.

## 2021-11-24 ENCOUNTER — Telehealth: Payer: Self-pay | Admitting: *Deleted

## 2021-11-24 NOTE — Telephone Encounter (Signed)
"  I was calling to find out by Dr. Allena Katz if I can stay in the hospital for two to three days after the surgery tomorrow because my daughter just started a new job and won't be here with me for a couple of days.  Could somebody call me back and let me know if he approved of it or not?  I don't feel being at home by myself the first couple of days isn't going to be real good.  Please let me know."

## 2021-11-24 NOTE — Telephone Encounter (Signed)
Received notice from the [facility] that patient is unable to afford the copay for Ocrevus. I will fill out the [employer] Patient Assistance Form and give to Dr. Epimenio Foot for review and completion.

## 2021-11-25 ENCOUNTER — Ambulatory Visit
Admission: RE | Admit: 2021-11-25 | Discharge: 2021-11-25 | Disposition: A | Discharge status: Home / Self Care | Payer: Medicare Other | Attending: Podiatry | Admitting: Podiatry

## 2021-11-25 ENCOUNTER — Ambulatory Visit: Payer: Medicare Other | Admitting: General Practice

## 2021-11-25 ENCOUNTER — Ambulatory Visit: Payer: Medicare Other

## 2021-11-25 ENCOUNTER — Other Ambulatory Visit: Payer: Self-pay

## 2021-11-25 ENCOUNTER — Encounter
Admission: RE | Disposition: A | Discharge status: Home / Self Care | Payer: Self-pay | Source: Home / Self Care | Attending: Podiatry

## 2021-11-25 DIAGNOSIS — E7800 Pure hypercholesterolemia, unspecified: Secondary | ICD-10-CM | POA: Insufficient documentation

## 2021-11-25 DIAGNOSIS — Z87891 Personal history of nicotine dependence: Secondary | ICD-10-CM | POA: Insufficient documentation

## 2021-11-25 DIAGNOSIS — Z6841 Body Mass Index (BMI) 40.0 and over, adult: Secondary | ICD-10-CM | POA: Diagnosis not present

## 2021-11-25 DIAGNOSIS — E1140 Type 2 diabetes mellitus with diabetic neuropathy, unspecified: Secondary | ICD-10-CM | POA: Diagnosis not present

## 2021-11-25 DIAGNOSIS — E669 Obesity, unspecified: Secondary | ICD-10-CM | POA: Diagnosis not present

## 2021-11-25 DIAGNOSIS — J45909 Unspecified asthma, uncomplicated: Secondary | ICD-10-CM | POA: Insufficient documentation

## 2021-11-25 DIAGNOSIS — G8929 Other chronic pain: Secondary | ICD-10-CM | POA: Diagnosis not present

## 2021-11-25 DIAGNOSIS — S82892A Other fracture of left lower leg, initial encounter for closed fracture: Secondary | ICD-10-CM | POA: Diagnosis not present

## 2021-11-25 DIAGNOSIS — M1990 Unspecified osteoarthritis, unspecified site: Secondary | ICD-10-CM | POA: Diagnosis not present

## 2021-11-25 DIAGNOSIS — N319 Neuromuscular dysfunction of bladder, unspecified: Secondary | ICD-10-CM | POA: Insufficient documentation

## 2021-11-25 DIAGNOSIS — G35 Multiple sclerosis: Secondary | ICD-10-CM | POA: Diagnosis not present

## 2021-11-25 DIAGNOSIS — I10 Essential (primary) hypertension: Secondary | ICD-10-CM | POA: Insufficient documentation

## 2021-11-25 DIAGNOSIS — S82832A Other fracture of upper and lower end of left fibula, initial encounter for closed fracture: Secondary | ICD-10-CM | POA: Diagnosis not present

## 2021-11-25 DIAGNOSIS — Z01818 Encounter for other preprocedural examination: Secondary | ICD-10-CM

## 2021-11-25 DIAGNOSIS — S93432A Sprain of tibiofibular ligament of left ankle, initial encounter: Secondary | ICD-10-CM | POA: Diagnosis not present

## 2021-11-25 DIAGNOSIS — X58XXXA Exposure to other specified factors, initial encounter: Secondary | ICD-10-CM | POA: Insufficient documentation

## 2021-11-25 DIAGNOSIS — Z96652 Presence of left artificial knee joint: Secondary | ICD-10-CM | POA: Insufficient documentation

## 2021-11-25 HISTORY — PX: SYNDESMOSIS REPAIR: SHX5182

## 2021-11-25 HISTORY — PX: ORIF ANKLE FRACTURE: SHX5408

## 2021-11-25 LAB — GLUCOSE, CAPILLARY
Glucose-Capillary: 95 mg/dL (ref 70–99)
Glucose-Capillary: 109 mg/dL — ABNORMAL HIGH (ref 70–99)

## 2021-11-25 SURGERY — OPEN REDUCTION INTERNAL FIXATION (ORIF) ANKLE FRACTURE
Anesthesia: General | Site: Ankle | Laterality: Left

## 2021-11-25 MED ORDER — DEXAMETHASONE SODIUM PHOSPHATE 10 MG/ML IJ SOLN
INTRAMUSCULAR | Status: DC | PRN
  Administered 2021-11-25: 5 mg via INTRAVENOUS

## 2021-11-25 MED ORDER — CHLORHEXIDINE GLUCONATE 0.12 % MT SOLN
15.0000 mL | Freq: Once | OROMUCOSAL | Status: AC
Start: 2021-11-25 — End: 2021-11-25

## 2021-11-25 MED ORDER — FENTANYL CITRATE (PF) 100 MCG/2ML IJ SOLN
INTRAMUSCULAR | Status: AC
  Filled 2021-11-25: qty 2

## 2021-11-25 MED ORDER — DEXAMETHASONE SODIUM PHOSPHATE 10 MG/ML IJ SOLN
INTRAMUSCULAR | Status: AC
  Filled 2021-11-25: qty 1

## 2021-11-25 MED ORDER — BUPIVACAINE HCL (PF) 0.5 % IJ SOLN
INTRAMUSCULAR | Status: AC
Start: 2021-11-25 — End: ?
  Filled 2021-11-25: qty 30

## 2021-11-25 MED ORDER — KETAMINE HCL 50 MG/5ML IJ SOSY
PREFILLED_SYRINGE | INTRAMUSCULAR | Status: AC
Start: 2021-11-25 — End: ?
  Filled 2021-11-25: qty 5

## 2021-11-25 MED ORDER — OXYCODONE HCL 5 MG/5ML PO SOLN: 5.0000 mg | Freq: Once | ORAL | Status: AC | PRN

## 2021-11-25 MED ORDER — LIDOCAINE HCL (PF) 2 % IJ SOLN
INTRAMUSCULAR | Status: AC
Start: 2021-11-25 — End: ?
  Filled 2021-11-25: qty 5

## 2021-11-25 MED ORDER — SUCCINYLCHOLINE CHLORIDE 200 MG/10ML IV SOSY
PREFILLED_SYRINGE | INTRAVENOUS | Status: DC | PRN
  Administered 2021-11-25: 200 mg via INTRAVENOUS

## 2021-11-25 MED ORDER — OXYCODONE HCL 5 MG PO TABS
ORAL_TABLET | ORAL | Status: AC
  Filled 2021-11-25: qty 1

## 2021-11-25 MED ORDER — DEXMEDETOMIDINE (PRECEDEX) IN NS 20 MCG/5ML (4 MCG/ML) IV SYRINGE
PREFILLED_SYRINGE | INTRAVENOUS | Status: DC | PRN
  Administered 2021-11-25: 4 ug via INTRAVENOUS

## 2021-11-25 MED ORDER — FENTANYL CITRATE (PF) 100 MCG/2ML IJ SOLN
25.0000 ug | INTRAMUSCULAR | Status: DC | PRN
  Administered 2021-11-25 (×2): 50 ug via INTRAVENOUS

## 2021-11-25 MED ORDER — ORAL CARE MOUTH RINSE: 15.0000 mL | Freq: Once | OROMUCOSAL | Status: AC

## 2021-11-25 MED ORDER — FENTANYL CITRATE (PF) 100 MCG/2ML IJ SOLN
INTRAMUSCULAR | Status: DC | PRN
Start: 2021-11-25 — End: 2021-11-25
  Administered 2021-11-25: 100 ug via INTRAVENOUS
  Administered 2021-11-25: 50 ug via INTRAVENOUS

## 2021-11-25 MED ORDER — PROPOFOL 10 MG/ML IV BOLUS
INTRAVENOUS | Status: AC
  Filled 2021-11-25: qty 20

## 2021-11-25 MED ORDER — BUPIVACAINE HCL (PF) 0.5 % IJ SOLN
INTRAMUSCULAR | Status: AC
  Filled 2021-11-25: qty 20

## 2021-11-25 MED ORDER — ONDANSETRON HCL 4 MG/2ML IJ SOLN
INTRAMUSCULAR | Status: DC | PRN
  Administered 2021-11-25: 4 mg via INTRAVENOUS

## 2021-11-25 MED ORDER — 0.9 % SODIUM CHLORIDE (POUR BTL) OPTIME
TOPICAL | Status: DC | PRN
  Administered 2021-11-25: 1000 mL

## 2021-11-25 MED ORDER — BUPIVACAINE HCL (PF) 0.5 % IJ SOLN
20.0000 mL | Freq: Once | INTRAMUSCULAR | Status: AC
  Administered 2021-11-25: 20 mL

## 2021-11-25 MED ORDER — SODIUM CHLORIDE 0.9 % IV SOLN: INTRAVENOUS | Status: DC

## 2021-11-25 MED ORDER — PROPOFOL 10 MG/ML IV BOLUS
INTRAVENOUS | Status: DC | PRN
  Administered 2021-11-25: 150 mg via INTRAVENOUS
  Administered 2021-11-25: 50 mg via INTRAVENOUS

## 2021-11-25 MED ORDER — ROCURONIUM BROMIDE 10 MG/ML (PF) SYRINGE
PREFILLED_SYRINGE | INTRAVENOUS | Status: AC
Start: 2021-11-25 — End: ?
  Filled 2021-11-25: qty 10

## 2021-11-25 MED ORDER — KETAMINE HCL 10 MG/ML IJ SOLN
INTRAMUSCULAR | Status: DC | PRN
  Administered 2021-11-25 (×2): 25 mg via INTRAVENOUS

## 2021-11-25 MED ORDER — MIDAZOLAM HCL 2 MG/2ML IJ SOLN
INTRAMUSCULAR | Status: AC
Start: 2021-11-25 — End: ?
  Filled 2021-11-25: qty 2

## 2021-11-25 MED ORDER — CEFAZOLIN SODIUM-DEXTROSE 2-4 GM/100ML-% IV SOLN
INTRAVENOUS | Status: AC
  Filled 2021-11-25: qty 100

## 2021-11-25 MED ORDER — ROCURONIUM BROMIDE 100 MG/10ML IV SOLN
INTRAVENOUS | Status: DC | PRN
  Administered 2021-11-25: 10 mg via INTRAVENOUS

## 2021-11-25 MED ORDER — OXYCODONE HCL 5 MG PO TABS
5.0000 mg | ORAL_TABLET | Freq: Once | ORAL | Status: AC | PRN
  Administered 2021-11-25: 5 mg via ORAL

## 2021-11-25 MED ORDER — CEFAZOLIN SODIUM-DEXTROSE 2-4 GM/100ML-% IV SOLN
2.0000 g | Freq: Once | INTRAVENOUS | Status: AC
  Administered 2021-11-25: 2 g via INTRAVENOUS

## 2021-11-25 MED ORDER — PHENYLEPHRINE HCL-NACL 20-0.9 MG/250ML-% IV SOLN
INTRAVENOUS | Status: DC | PRN
  Administered 2021-11-25: 25 ug/min via INTRAVENOUS

## 2021-11-25 MED ORDER — SUGAMMADEX SODIUM 500 MG/5ML IV SOLN
INTRAVENOUS | Status: AC
Start: 2021-11-25 — End: ?
  Filled 2021-11-25: qty 5

## 2021-11-25 MED ORDER — MIDAZOLAM HCL 2 MG/2ML IJ SOLN
INTRAMUSCULAR | Status: DC | PRN
  Administered 2021-11-25: 2 mg via INTRAVENOUS

## 2021-11-25 MED ORDER — CHLORHEXIDINE GLUCONATE 0.12 % MT SOLN
OROMUCOSAL | Status: AC
  Administered 2021-11-25: 15 mL via OROMUCOSAL
  Filled 2021-11-25: qty 15

## 2021-11-25 SURGICAL SUPPLY — 62 items
APL PRP STRL LF DISP 70% ISPRP (MISCELLANEOUS) ×1
BIT DRILL 2 QR W/DEPTH MARKS (BIT) ×2
BIT DRILL 2.7 QR (BIT) ×2
BIT DRILL 2.8 QR W/DEPTH MARKS (BIT) ×1 IMPLANT
BIT DRILL 2MM QR W/DEPTH MARKS (BIT) IMPLANT
BIT DRILL SURGIBIT 2.7MM QR (BIT) IMPLANT
BNDG CMPR STD VLCR NS LF 5.8X4 (GAUZE/BANDAGES/DRESSINGS) ×1
BNDG COHESIVE 4X5 TAN ST LF (GAUZE/BANDAGES/DRESSINGS) ×2 IMPLANT
BNDG ELASTIC 4X5.8 VLCR NS LF (GAUZE/BANDAGES/DRESSINGS) ×2 IMPLANT
BNDG ESMARK 4X12 TAN STRL LF (GAUZE/BANDAGES/DRESSINGS) ×2 IMPLANT
BNDG GAUZE DERMACEA FLUFF (GAUZE/BANDAGES/DRESSINGS) ×1
BNDG GAUZE DERMACEA FLUFF 4 (GAUZE/BANDAGES/DRESSINGS) ×1 IMPLANT
BNDG GZE DERMACEA 4 6PLY (GAUZE/BANDAGES/DRESSINGS) ×1
CHLORAPREP W/TINT 26 (MISCELLANEOUS) ×2 IMPLANT
CUFF TOURN SGL QUICK 18X4 (TOURNIQUET CUFF) IMPLANT
CUFF TOURN SGL QUICK 24 (TOURNIQUET CUFF)
CUFF TRNQT CYL 24X4X16.5-23 (TOURNIQUET CUFF) IMPLANT
DRAPE C-ARM 42X70 (DRAPES) ×2 IMPLANT
DRAPE C-ARMOR (DRAPES) ×2 IMPLANT
ELECT REM PT RETURN 9FT ADLT (ELECTROSURGICAL) ×2
ELECTRODE REM PT RTRN 9FT ADLT (ELECTROSURGICAL) ×1 IMPLANT
GAUZE SPONGE 4X4 12PLY STRL (GAUZE/BANDAGES/DRESSINGS) ×2 IMPLANT
GAUZE XEROFORM 1X8 LF (GAUZE/BANDAGES/DRESSINGS) ×2 IMPLANT
GLOVE BIO SURGEON STRL SZ7.5 (GLOVE) ×2 IMPLANT
GLOVE SURG SYN 7.0 (GLOVE) ×2 IMPLANT
GLOVE SURG SYN 7.0 PF PI (GLOVE) ×1 IMPLANT
GOWN STRL REUS W/ TWL LRG LVL3 (GOWN DISPOSABLE) ×1 IMPLANT
GOWN STRL REUS W/ TWL XL LVL3 (GOWN DISPOSABLE) ×1 IMPLANT
GOWN STRL REUS W/TWL LRG LVL3 (GOWN DISPOSABLE) ×2
GOWN STRL REUS W/TWL XL LVL3 (GOWN DISPOSABLE) ×2
GUIDEWIRE ORTH 6X062XTROC NS (WIRE) IMPLANT
K-WIRE .062 (WIRE) ×2
KIT REPAIR ANKLE SYNDESMOSIS (Ankle) IMPLANT
KIT REPAIR W/O DRILL ANKLE (Ankle) ×1 IMPLANT
KIT TURNOVER KIT A (KITS) ×2 IMPLANT
LABEL OR SOLS (LABEL) ×2 IMPLANT
MANIFOLD NEPTUNE II (INSTRUMENTS) ×2 IMPLANT
NEEDLE HYPO 22GX1.5 SAFETY (NEEDLE) ×2 IMPLANT
NS IRRIG 500ML POUR BTL (IV SOLUTION) ×2 IMPLANT
PACK EXTREMITY ARMC (MISCELLANEOUS) ×2 IMPLANT
PAD ABD DERMACEA PRESS 5X9 (GAUZE/BANDAGES/DRESSINGS) ×2 IMPLANT
PAD PREP 24X41 OB/GYN DISP (PERSONAL CARE ITEMS) ×2 IMPLANT
PLATE LATERAL FIBULA 5HOLE (Plate) ×1 IMPLANT
SCREW HEX LOCK  2.7X8MM (Screw) IMPLANT
SCREW LOCK 12X3.5X HEXALOBE (Screw) IMPLANT
SCREW LOCKING 3.5X10MM (Screw) ×1 IMPLANT
SCREW LOCKING 3.5X12 (Screw) ×6 IMPLANT
SCREW NLCKG 2.7X10 HEXA (Screw) IMPLANT
SCREW NON LOCKING 2.7X10 (Screw) ×2 IMPLANT
SCREW NONLOCK 2.7X14 (Screw) ×1 IMPLANT
SCREW NONLOCK 2.7X8 (Screw) ×1 IMPLANT
SCREW VA 2.7X12 (Screw) ×1 IMPLANT
SCREW VA LOCKING 2.7X10 (Screw) ×2 IMPLANT
SPONGE T-LAP 18X18 ~~LOC~~+RFID (SPONGE) ×2 IMPLANT
STAPLER SKIN PROX 35W (STAPLE) IMPLANT
STOCKINETTE M/LG 89821 (MISCELLANEOUS) ×2 IMPLANT
STRAP SAFETY 5IN WIDE (MISCELLANEOUS) ×2 IMPLANT
SUT MNCRL AB 3-0 PS2 27 (SUTURE) ×2 IMPLANT
SUT MNCRL AB 4-0 PS2 18 (SUTURE) ×2 IMPLANT
SUT MON AB 5-0 P3 18 (SUTURE) ×1 IMPLANT
SYR 10ML LL (SYRINGE) ×2 IMPLANT
WIRE PLATE TACK (WIRE) ×2 IMPLANT

## 2021-11-25 NOTE — Anesthesia Procedure Notes (Deleted)
Anesthesia Regional Block: Popliteal block   Pre-Anesthetic Checklist: , timeout performed,  Correct Patient, Correct Site, Correct Laterality,  Correct Procedure, Correct Position, site marked,  Risks and benefits discussed,  Surgical consent,  Pre-op evaluation,  At surgeon's request and post-op pain management  Laterality: Lower  Prep: chloraprep       Needles:  Injection technique: Single-shot  Needle Type: Echogenic Needle     Needle Length: 9cm  Needle Gauge: 21     Additional Needles:   Procedures:,,,, ultrasound used (permanent image in chart),,    Narrative:  Injection made incrementally with aspirations every 5 mL.  Performed by: Personally  Anesthesiologist: Corinda Gubler, MD  Additional Notes: Patient's chart reviewed and they were deemed appropriate candidate for procedure, at surgeon's request. Patient educated about risks, benefits, and alternatives of the block including but not limited to: temporary or permanent nerve damage, bleeding, infection, damage to surround tissues, block failure, local anesthetic toxicity. Patient expressed understanding. A formal time-out was conducted consistent with institution rules.  Monitors were applied, and minimal sedation used. The site was prepped with skin prep and allowed to dry, and sterile gloves were used. A high frequency linear ultrasound probe with probe cover was utilized throughout. Popliteal artery pulsatile and visualized in popliteal fossa along with adjacent sciatic nerve and its branch point, which appeared anatomically normal, local anesthetic injected around them just proximal to the branch point, and echogenic block needle trajectory was monitored throughout. Aspiration performed every 64ml. Blood vessels were avoided. All injections were performed without resistance and free of blood and paresthesias. The patient tolerated the procedure well.  Injectate: ***

## 2021-11-25 NOTE — Transfer of Care (Signed)
Immediate Anesthesia Transfer of Care Note  Patient: [name]  Procedure(s) Performed: OPEN REDUCTION INTERNAL FIXATION (ORIF) LEFT ANKLE FRACTURE (Left: Ankle) SYNDESMOSIS REPAIR (Left: Ankle)  Patient Location: PACU  Anesthesia Type:General  Level of Consciousness: drowsy  Airway & Oxygen Therapy: Patient Spontanous Breathing and Patient connected to face mask oxygen  Post-op Assessment: Report given to RN and Post -op Vital signs reviewed and stable  Post vital signs: Reviewed and stable  Last Vitals:  Vitals Value Taken Time  BP 158/91 11/25/21 1419  Temp    Pulse 87 11/25/21 1421  Resp 26 11/25/21 1421  SpO2 99 % 11/25/21 1421  Vitals shown include unvalidated device data.  Last Pain:  Vitals:   11/25/21 1040  TempSrc: Temporal  PainSc: 10-Worst pain ever         Complications: No notable events documented.

## 2021-11-25 NOTE — Discharge Instructions (Signed)
AMBULATORY SURGERY  ?DISCHARGE INSTRUCTIONS ? ? ?The drugs that you were given will stay in your system until tomorrow so for the next 24 hours you should not: ? ?Drive an automobile ?Make any legal decisions ?Drink any alcoholic beverage ? ? ?You may resume regular meals tomorrow.  Today it is better to start with liquids and gradually work up to solid foods. ? ?You may eat anything you prefer, but it is better to start with liquids, then soup and crackers, and gradually work up to solid foods. ? ? ?Please notify your doctor immediately if you have any unusual bleeding, trouble breathing, redness and pain at the surgery site, drainage, fever, or pain not relieved by medication. ? ? ? ?Additional Instructions: ? ? ? ?Please contact your physician with any problems or Same Day Surgery at [phone], Monday through Friday 6 am to 4 pm, or [place] [facility] [place] Main number at [phone].  ?

## 2021-11-25 NOTE — Anesthesia Postprocedure Evaluation (Signed)
Anesthesia Post Note  Patient: [name]  Procedure(s) Performed: OPEN REDUCTION INTERNAL FIXATION (ORIF) LEFT ANKLE FRACTURE (Left: Ankle) SYNDESMOSIS REPAIR (Left: Ankle)  Patient location during evaluation: PACU Anesthesia Type: Combined General/Spinal Level of consciousness: awake and alert Pain management: pain level controlled Vital Signs Assessment: post-procedure vital signs reviewed and stable Respiratory status: spontaneous breathing, nonlabored ventilation, respiratory function stable and patient connected to nasal cannula oxygen Cardiovascular status: blood pressure returned to baseline and stable Postop Assessment: no apparent nausea or vomiting Anesthetic complications: no   No notable events documented.   Last Vitals:  Vitals:   11/25/21 1040  BP: (!) 151/83  Pulse: 92  Resp: 16  Temp: 36.6 C  SpO2: 97%    Last Pain:  Vitals:   11/25/21 1040  TempSrc: Temporal  PainSc: 10-Worst pain ever                 [facility]

## 2021-11-25 NOTE — Anesthesia Preprocedure Evaluation (Signed)
Anesthesia Evaluation  Patient identified by MRN, date of birth, ID band Patient awake    Reviewed: Allergy & Precautions, NPO status , Patient's Chart, lab work & pertinent test results  Airway Mallampati: III  TM Distance: >3 FB Neck ROM: full    Dental  (+) Chipped   Pulmonary asthma , former smoker,    Pulmonary exam normal        Cardiovascular hypertension, negative cardio ROS Normal cardiovascular exam     Neuro/Psych PSYCHIATRIC DISORDERS negative neurological ROS     GI/Hepatic Neg liver ROS, GERD  ,  Endo/Other  negative endocrine ROSdiabetes  Renal/GU      Musculoskeletal   Abdominal   Peds  Hematology negative hematology ROS (+)   Anesthesia Other Findings Past Medical History: No date: Acute cystitis No date: Acute metabolic encephalopathy No date: Anemia No date: Arthritis No date: Chronic pain No date: Diabetes mellitus No date: Fibromyalgia No date: GERD (gastroesophageal reflux disease) No date: Hepatitis C     Comment:  received treatment No date: History of methicillin resistant staphylococcus aureus (MRSA) No date: Hypercholesteremia No date: Hypertension No date: IBS (irritable bowel syndrome) No date: Multiple sclerosis exacerbation (HCC) No date: Neurogenic bladder     Comment:  pt denies having to cath herself-says she voids on her               own as of 11-21-21 No date: Neuropathy No date: Obesity No date: Sacral nerve stimulator present     Comment:  battery is dead and has not worked in years No date: Stroke [facility])     Comment:  pt denies this but pcp has it in their history No date: Tachycardia No date: UTI (urinary tract infection) No date: Vitamin D deficiency  Past Surgical History: No date: ABDOMINAL HYSTERECTOMY     Comment:  partial 12/14/2020: I & D EXTREMITY; Right     Comment:  Procedure: IRRIGATION AND DEBRIDEMENT RIGHT LOWER               EXTREMITY;   Surgeon: Samson Frederic, MD;  Location: [facility]               OR;  Service: Orthopedics;  Laterality: Right; No date: JOINT REPLACEMENT     Comment:  LTK 02/18/2021: RADIOLOGY WITH ANESTHESIA; N/A     Comment:  Procedure: MRI WITH ANESTHESIA CERVICAL SPINE WITH AND               WITHOUT CONTRAST AND BRAIN WITH AND WITHOUT CONTRAST;                Surgeon: Radiologist, Medication, MD;  Location: [facility] OR;                Service: Radiology;  Laterality: N/A; 07/07/2021: RADIOLOGY WITH ANESTHESIA; N/A     Comment:  Procedure: MRI WITH ANESTHESIA;  Surgeon: Radiologist,               Medication, MD;  Location: [facility] OR;  Service: Radiology;                Laterality: N/A; No date: SACRAL NERVE STIMULATOR PLACEMENT     Comment:  [employer] No date: TUBAL LIGATION  BMI    Body Mass Index: 45.69 kg/m      Reproductive/Obstetrics negative OB ROS                             Anesthesia  Physical Anesthesia Plan  ASA: 3  Anesthesia Plan: General/Spinal   Post-op Pain Management:    Induction:   PONV Risk Score and Plan: Ondansetron, Dexamethasone, Midazolam and Treatment may vary due to age or medical condition  Airway Management Planned:   Additional Equipment:   Intra-op Plan:   Post-operative Plan:   Informed Consent: I have reviewed the patients History and Physical, chart, labs and discussed the procedure including the risks, benefits and alternatives for the proposed anesthesia with the patient or authorized representative who has indicated his/her understanding and acceptance.     Dental Advisory Given  Plan Discussed with: Anesthesiologist, CRNA and Surgeon  Anesthesia Plan Comments:         Anesthesia Quick Evaluation

## 2021-11-25 NOTE — Addendum Note (Signed)
Addendum  created 11/25/21 1458 by Stephanie Coup, MD   Delete clinical note, Intraprocedure Blocks edited, Order Canceled from Note

## 2021-11-25 NOTE — Interval H&P Note (Signed)
History and Physical Interval Note:  11/25/2021 12:15 PM  [name]  has presented today for surgery, with the diagnosis of ANKLE FRACTURE LEFT FOOT.  The various methods of treatment have been discussed with the patient and family. After consideration of risks, benefits and other options for treatment, the patient has consented to  Procedure(s) with comments: OPEN REDUCTION INTERNAL FIXATION (ORIF) LEFT ANKLE FRACTURE (Left) - POPLITEAL BLOCK SYNDESMOSIS REPAIR (Left) as a surgical intervention.  The patient's history has been reviewed, patient examined, no change in status, stable for surgery.  I have reviewed the patient's chart and labs.  Questions were answered to the patient's satisfaction.     Candelaria Stagers

## 2021-11-25 NOTE — Telephone Encounter (Signed)
Faxed patient assistance application for patient's Ocrevus to [employer]. Received a receipt of confirmation.

## 2021-11-25 NOTE — Anesthesia Procedure Notes (Signed)
Anesthesia Regional Block: Popliteal block   Pre-Anesthetic Checklist: , timeout performed,  Correct Patient, Correct Site, Correct Laterality,  Correct Procedure, Correct Position, site marked,  Risks and benefits discussed,  Surgical consent,  Pre-op evaluation,  At surgeon's request and post-op pain management  Laterality: Lower and Left  Prep: chloraprep       Needles:  Injection technique: Single-shot  Needle Type: Echogenic Needle     Needle Length: 9cm  Needle Gauge: 21     Additional Needles:   Procedures:,,,, ultrasound used (permanent image in chart),,    Narrative:  Start time: 11/25/2021 2:35 PM End time: 11/25/2021 2:43 PM Injection made incrementally with aspirations every 5 mL.  Performed by: Personally  Anesthesiologist: Corinda Gubler, MD  Additional Notes: Patient's chart reviewed and they were deemed appropriate candidate for procedure, at surgeon's request. Patient educated about risks, benefits, and alternatives of the block including but not limited to: temporary or permanent nerve damage, bleeding, infection, damage to surround tissues, block failure, local anesthetic toxicity. Patient expressed understanding. A formal time-out was conducted consistent with institution rules.  Monitors were applied, and minimal sedation used. The site was prepped with skin prep and allowed to dry, and sterile gloves were used. A high frequency linear ultrasound probe with probe cover was utilized throughout. Popliteal artery pulsatile and visualized in popliteal fossa along with adjacent sciatic nerve and its branch point, which appeared anatomically normal, local anesthetic injected around them just proximal to the branch point, and echogenic block needle trajectory was monitored throughout. Aspiration performed every 39ml. Blood vessels were avoided. All injections were performed without resistance and free of blood and paresthesias. The patient tolerated the procedure  well.  Injectate: 10cc of 0.5% bupivacaine

## 2021-11-25 NOTE — Anesthesia Procedure Notes (Signed)
Procedure Name: Intubation Date/Time: 11/25/2021 12:44 PM  Performed by: Alanson Puls, RNPre-anesthesia Checklist: Patient identified, Patient being monitored, Timeout performed, Emergency Drugs available and Suction available Patient Re-evaluated:Patient Re-evaluated prior to induction Oxygen Delivery Method: Circle system utilized Preoxygenation: Pre-oxygenation with 100% oxygen Induction Type: IV induction Ventilation: Mask ventilation without difficulty Laryngoscope Size: 3 and McGraph Grade View: Grade I Tube type: Oral Tube size: 7.0 mm Number of attempts: 1 Airway Equipment and Method: Stylet Placement Confirmation: ETT inserted through vocal cords under direct vision, positive ETCO2 and breath sounds checked- equal and bilateral Secured at: 21 cm Tube secured with: Tape Dental Injury: Teeth and Oropharynx as per pre-operative assessment

## 2021-11-26 ENCOUNTER — Other Ambulatory Visit: Payer: Self-pay

## 2021-11-26 ENCOUNTER — Encounter: Payer: Self-pay | Admitting: Podiatry

## 2021-11-26 ENCOUNTER — Telehealth: Payer: Self-pay | Admitting: Podiatry

## 2021-11-26 NOTE — Telephone Encounter (Signed)
Pt called and gave me a number to [employer] insurance to call to get authorization for her transportation to be approved. I talked with Erskine Squibb  ref # [id number] and she said no authorization was needed and gave me a number to call for transportation but it was not a correct number.  Called back after lunch and spoke to [place] Ref # [id number] and she transferred me to  [employer] in transportation. He had to call his supervisor and got a reservation for pt for tomorrow (reservation # [phone]) they will call pt when they are on the way for the appt..   Pt was notified and said thank you.

## 2021-11-26 NOTE — Telephone Encounter (Signed)
Please advise 

## 2021-11-26 NOTE — Telephone Encounter (Signed)
Received fax from [employer] that pt approved to get Ocrevus free of charge. Gave copy to [facility] and sent copy to be scanned to pt chart.

## 2021-11-26 NOTE — Telephone Encounter (Signed)
Pt called with insurance company on the phone trying to get pt transportation.The insurance company states that she has used her allotted transportation and the provider could request for a non urgent ambulance to pick pt up but it needs a prior auth. I told him to tell me how to get it started and I will get it done. He put [place] on hold and then came back stating he would call me back as they are trying to figure it out.

## 2021-11-26 NOTE — Telephone Encounter (Signed)
Patient called back , she cant come in today , we put her on schedule for 11/27/21 1115a.  Patient states her leg will not stay on the knee scooter and she has hit her surgical site 3 times, may have popped out some stitches?  Patient wants to know if she gets some velcro can she velcro her leg to the scooter?  Please advise

## 2021-11-26 NOTE — Telephone Encounter (Signed)
Pt called and had surgery yesterday and was using the knee scooter and has fallen 3 times. She said the last time she thinks she may have broken the stiches.  I offered her to come in at 1015 and 1115 this morning but she has no transportation and is home by herself currently she is afraid to use the knee scooter as well. She is going to see if she can find a ride and call us back. I told her the doctor will probably want to see her today.  She stated she thinks the afternoon would be better for her to try to get in.

## 2021-11-27 ENCOUNTER — Ambulatory Visit (INDEPENDENT_AMBULATORY_CARE_PROVIDER_SITE_OTHER): Payer: Medicare Other | Admitting: Podiatry

## 2021-11-27 ENCOUNTER — Ambulatory Visit (INDEPENDENT_AMBULATORY_CARE_PROVIDER_SITE_OTHER): Payer: Medicare Other

## 2021-11-27 DIAGNOSIS — S82892A Other fracture of left lower leg, initial encounter for closed fracture: Secondary | ICD-10-CM

## 2021-11-27 DIAGNOSIS — S93432A Sprain of tibiofibular ligament of left ankle, initial encounter: Secondary | ICD-10-CM

## 2021-11-27 DIAGNOSIS — Z9889 Other specified postprocedural states: Secondary | ICD-10-CM

## 2021-11-27 NOTE — Op Note (Signed)
Surgeon: Surgeon(s): Candelaria Stagers, DPM  Assistants: None Pre-operative diagnosis: ANKLE FRACTURE LEFT FOOT  Post-operative diagnosis: same Procedure: Procedure(s) (LRB): OPEN REDUCTION INTERNAL FIXATION (ORIF) LEFT ANKLE FRACTURE (Left) SYNDESMOSIS REPAIR (Left)  Pathology: * No specimens in log *  Pertinent Intra-op findings: Fibular fracture noted with gapping of the syndesmosis Anesthesia: General  Hemostasis:  Total Tourniquet Time Documented: Thigh (Left) - 70 minutes Total: Thigh (Left) - 70 minutes  EBL: 25 mL  Materials: Acumed anatomic plate with screws and acumed tightrope Injectables: None Complications: None  Indications for surgery: A 61 y.o. female presents with Left ankle fracture painful Patient has failed all conservative therapy including but not limited to nonweightbearing in CAM boot. She wishes to have surgical correction of the foot/deformity. It was determined that patient would benefit from Left ankle fracture ORIF with syndesmotic reduction. Informed surgical risk consent was reviewed and read aloud to the patient.  I reviewed the films.  I have discussed my findings with the patient in great detail.  I have discussed all risks including but not limited to infection, stiffness, scarring, limp, disability, deformity, damage to blood vessels and nerves, numbness, poor healing, need for braces, arthritis, chronic pain, amputation, death.  All benefits and realistic expectations discussed in great detail.  I have made no promises as to the outcome.  I have provided realistic expectations.  I have offered the patient a 2nd opinion, which they have declined and assured me they preferred to proceed despite the risks   Procedure in detail: The patient was both verbally and visually identified by myself, the nursing staff, and anesthesia staff in the preoperative holding area. They were then transferred to the operating room and placed on the operative table in supine  position.  Prior to the procedure, the risks, benefits, and alternatives were discussed, and the patient agreed to proceed. Following anesthesia, He was placed in the appropriate position. Prepping and draping was performed. A timeout was done. The operative left lower extremity was outfitted with a pneumatic tourniquet at the level of the upper thigh. Next, the operative extremity was elevated and exsanguinated, the tourniquet was then elevated. At this point, attention was turned to the lateral aspect of the ankle. After marking the intended incision with a marking pen, a strait lateral incision was made directly over the distal fibula. The dissection was taken through the skin and subcutaneous layers directly to the level of the bone. A sub-periosteal dissection of the distal fibula fracture was performed and the fracture site identified. The fracture site was debrided of interposed soft tissue and hematoma. The fracture wasanatomic then reduced using a reduction clamp. Initial fixation was then performed with an anterior to posterior lag screw at the fracture site. The fracture was then further stabilized with a laterally based 1/3 semi-tubular locking plate. This was applied with bi-cortical screws to compress the plate to bone and then completed with locking screws. At this point, fluoroscopy showed the lateral malleolar fracture to be reduced and appropriately fixated. All hardware is in good position.  Dorsiflexion external stress test showed gapping of medial malleolus, it was determined that patient would benefit from reduction. Using large reduction clam the syndesmotic was reduced, Acumed tightrope was inserted in standard technqiue. Fluroscopy was confirmed with good placement. See below. Both wounds were then irrigated and closed in layers using 2-0 and 3-0 Monocryl for the subcutaneous layers and a stapler for the skin. Sterile dressings were then followed by application of cam boot  nonweightbearing  At the conclusion of the procedure the patient was awoken from anesthesia and found to have tolerated the procedure well any complications. There were transferred to PACU with vital signs stable and vascular status intact.  Boneta Lucks, DPM

## 2021-12-03 ENCOUNTER — Encounter: Payer: Medicare Other | Admitting: Podiatry

## 2021-12-04 NOTE — Progress Notes (Signed)
Subjective:  Patient ID: [name], female    DOB: 06/30/1960,  MRN: [id number]  Chief Complaint  Patient presents with   Routine Post Op    DOS: 11/25/2021 Procedure: ORIF of left ankle fracture  61 y.o. female returns for post-op check.  Patient states she is doing well.  Minimal complaints.  She has been nonweightbearing to the left lower extremity.  Bandages clean dry and intact.  Review of Systems: Negative except as noted in the HPI. Denies N/V/F/Ch.  Past Medical History:  Diagnosis Date   Acute cystitis    Acute metabolic encephalopathy    Anemia    Arthritis    Chronic pain    Diabetes mellitus    Fibromyalgia    GERD (gastroesophageal reflux disease)    Hepatitis C    received treatment   History of methicillin resistant staphylococcus aureus (MRSA)    Hypercholesteremia    Hypertension    IBS (irritable bowel syndrome)    Multiple sclerosis exacerbation (HCC)    Neurogenic bladder    pt denies having to cath herself-says she voids on her own as of 11-21-21   Neuropathy    Obesity    Sacral nerve stimulator present    battery is dead and has not worked in years   Stroke [facility])    pt denies this but pcp has it in their history   Tachycardia    UTI (urinary tract infection)    Vitamin D deficiency     Current Outpatient Medications:    Accu-Chek Softclix Lancets lancets, Use as directed up to 4 times daily, Disp: 100 each, Rfl: 5   amantadine (SYMMETREL) 100 MG capsule, Take 1 capsule (100 mg total) by mouth 2 (two) times daily., Disp: 60 capsule, Rfl: 11   amitriptyline (ELAVIL) 25 MG tablet, TAKE 1 TABLET(25 MG) BY MOUTH AT BEDTIME, Disp: 30 tablet, Rfl: 5   amLODipine (NORVASC) 10 MG tablet, Take 1 tablet (10 mg total) by mouth daily. (Patient taking differently: Take 10 mg by mouth at bedtime.), Disp: , Rfl:    atorvastatin (LIPITOR) 80 MG tablet, Take 1 tablet (80 mg total) by mouth daily. (Patient taking differently: Take 80 mg by mouth every  evening.), Disp: 90 tablet, Rfl: 3   baclofen (LIORESAL) 20 MG tablet, Take 20 mg by mouth 2 (two) times daily., Disp: , Rfl:    Biotin w/ Vitamins C & E (HAIR/SKIN/NAILS PO), Take 3 tablets by mouth daily., Disp: , Rfl:    Blood Glucose Monitoring Suppl (BLOOD GLUCOSE MONITOR SYSTEM) w/Device KIT, use as directed, Disp: 1 kit, Rfl: 0   Cholecalciferol (VITAMIN D-3) 125 MCG (5000 UT) TABS, Take 5,000 Units by mouth in the morning., Disp: , Rfl:    dicyclomine (BENTYL) 20 MG tablet, Take 20 mg by mouth 2 (two) times daily., Disp: , Rfl:    Dimethyl Fumarate (TECFIDERA) 240 MG CPDR, Take 1 capsule (240 mg total) by mouth 2 (two) times daily., Disp: 180 capsule, Rfl: 3   doxepin (SINEQUAN) 10 MG capsule, Take 10 mg by mouth at bedtime., Disp: , Rfl:    DULoxetine (CYMBALTA) 30 MG capsule, 30 mg 2 (two) times daily., Disp: , Rfl:    Eszopiclone (LUNESTA PO), Take 1 tablet by mouth at bedtime., Disp: , Rfl:    famotidine (PEPCID) 20 MG tablet, Take 20 mg by mouth 2 (two) times daily., Disp: , Rfl:    fluticasone (FLONASE) 50 MCG/ACT nasal spray, Place 2 sprays into the nose daily. (  Patient taking differently: Place 2 sprays into the nose in the morning and at bedtime.), Disp: 16 g, Rfl: 11   glucose blood (ACCU-CHEK GUIDE) test strip, Use as instructed up to 4 times daily, Disp: 100 each, Rfl: 12   Homeopathic Products (LEG CRAMPS PO), Place 3 tablets under the tongue 2 (two) times daily. 3 am tablets and 3 pm tablets., Disp: , Rfl:    hydrALAZINE (APRESOLINE) 50 MG tablet, Take 1 tablet (50 mg total) by mouth every 8 (eight) hours., Disp: , Rfl:    ibuprofen (ADVIL) 200 MG tablet, Take 400 mg by mouth every 6 (six) hours as needed., Disp: , Rfl:    insulin glargine (LANTUS) 100 UNIT/ML Solostar Pen, Inject 40 Units into the skin daily. (Patient taking differently: Inject 40 Units into the skin every morning.), Disp: 15 mL, Rfl: 2   insulin lispro (HUMALOG) 100 UNIT/ML KwikPen, Inject 10 Units into the  skin 3 (three) times daily with meals. (Patient taking differently: Inject 3-6 Units into the skin 3 (three) times daily with meals. Sliding scale insulin), Disp: 15 mL, Rfl: 2   Insulin Pen Needle 32G X 4 MM MISC, use as directed 4 times daily, Disp: 200 each, Rfl: 2   lamoTRIgine (LAMICTAL) 200 MG tablet, TAKE 1 TABLET BY MOUTH TWICE DAILY (Patient taking differently: Take 200 mg by mouth 2 (two) times daily.), Disp: 60 tablet, Rfl: 11   lansoprazole (PREVACID) 30 MG capsule, Take 30 mg by mouth 2 (two) times daily before a meal., Disp: , Rfl:    loperamide (IMODIUM) 2 MG capsule, Take 1 capsule (2 mg total) by mouth 2 (two) times daily., Disp: 30 capsule, Rfl: 0   losartan (COZAAR) 100 MG tablet, Take 1 tablet (100 mg total) by mouth daily. (Patient taking differently: Take 100 mg by mouth at bedtime.), Disp: 90 tablet, Rfl: 3   MAGNESIUM-OXIDE 400 (240 Mg) MG tablet, Take 1 tablet by mouth daily., Disp: , Rfl:    MELATONIN MAXIMUM STRENGTH 5 MG TABS, Take 5 mg by mouth at bedtime., Disp: , Rfl:    montelukast (SINGULAIR) 10 MG tablet, Take 10 mg by mouth at bedtime., Disp: , Rfl:    NARCAN 4 MG/0.1ML LIQD nasal spray kit, Place 1 spray into the nose once as needed (overdose)., Disp: , Rfl:    Oxcarbazepine (TRILEPTAL) 300 MG tablet, Take 1 tablet (300 mg total) by mouth 2 (two) times daily., Disp: 60 tablet, Rfl: 5   oxyCODONE (ROXICODONE) 15 MG immediate release tablet, Take 15 mg by mouth in the morning, at noon, in the evening, and at bedtime. May take up to a total of 6 times per day if needed, Disp: , Rfl:    OZEMPIC, 0.25 OR 0.5 MG/DOSE, 2 MG/1.5ML SOPN, Inject into the skin once a week. Tuesdays, Disp: , Rfl:    pantoprazole (PROTONIX) 40 MG tablet, 40 mg 2 (two) times daily., Disp: , Rfl:    tiZANidine (ZANAFLEX) 2 MG tablet, Take 2 mg by mouth as needed., Disp: , Rfl:    triamterene-hydrochlorothiazide (MAXZIDE-25) 37.5-25 MG tablet, Take 1 tablet by mouth every morning., Disp: , Rfl:     VIBERZI 100 MG TABS, Take 100 mg by mouth 2 (two) times daily., Disp: , Rfl:    vitamin B-12 (CYANOCOBALAMIN) 1000 MCG tablet, Take 1,000 mcg by mouth in the morning., Disp: , Rfl:    XIIDRA 5 % SOLN, Place 2 drops into both eyes in the morning and at bedtime., Disp: , Rfl:  Social History   Tobacco Use  Smoking Status Former   Packs/day: 0.25   Years: 12.00   Total pack years: 3.00   Types: Cigarettes   Quit date: 03/2021   Years since quitting: 0.7  Smokeless Tobacco Never  Tobacco Comments   06/13/20 1 pk per 3 days    No Known Allergies Objective:  There were no vitals filed for this visit. There is no height or weight on file to calculate BMI. Constitutional Well developed. Well nourished.  Vascular Foot warm and well perfused. Capillary refill normal to all digits.   Neurologic Normal speech. Oriented to person, place, and time. Epicritic sensation to light touch grossly present bilaterally.  Dermatologic Skin healing well without signs of infection. Skin edges well coapted without signs of infection.  Orthopedic: Tenderness to palpation noted about the surgical site.   Radiographs: 3 views of skeletally mature adult left foot: Hardware is intact no signs of backing out or loosening noted.  Good correction reduction of fracture noted.  No bony abnormalities identified. Assessment:   1. Closed fracture of left ankle, initial encounter   2. Status post foot surgery   3. Syndesmotic disruption of left ankle, initial encounter    Plan:  Patient was evaluated and treated and all questions answered.  S/p foot surgery left -Progressing as expected post-operatively. -XR: See above -WB Status: Nonweightbearing in left lower extremity -Sutures: Intact.  No dehiscence noted no complication noted -Medications: None -Foot redressed.  No follow-ups on file.

## 2021-12-10 ENCOUNTER — Encounter: Payer: Medicare Other | Admitting: Podiatry

## 2021-12-10 ENCOUNTER — Ambulatory Visit: Payer: Medicare Other | Admitting: Family Medicine

## 2021-12-17 ENCOUNTER — Ambulatory Visit (INDEPENDENT_AMBULATORY_CARE_PROVIDER_SITE_OTHER): Payer: Medicare Other | Admitting: Podiatry

## 2021-12-17 DIAGNOSIS — Z9889 Other specified postprocedural states: Secondary | ICD-10-CM

## 2021-12-17 DIAGNOSIS — S82892A Other fracture of left lower leg, initial encounter for closed fracture: Secondary | ICD-10-CM

## 2021-12-17 DIAGNOSIS — I999 Unspecified disorder of circulatory system: Secondary | ICD-10-CM

## 2021-12-17 MED ORDER — DOXYCYCLINE HYCLATE 100 MG PO TABS: 100.0000 mg | ORAL_TABLET | Freq: Two times a day (BID) | ORAL | 0 refills | Status: AC

## 2021-12-17 NOTE — Progress Notes (Signed)
Subjective:  Patient ID: [name], female    DOB: Mar 19, 1961,  MRN: [id number]  Chief Complaint  Patient presents with   Routine Post Op    POV #2 DOS 11/25/21 --- LEFT ANKLE ORIF, SYNDESMOSIS LEFT    DOS: 11/25/2021 Procedure: ORIF of left ankle fracture  61 y.o. female returns for post-op check.  Patient states she is doing well.  Minimal complaints.  She has been nonweightbearing to the left lower extremity.  Bandages clean dry and intact.  Review of Systems: Negative except as noted in the HPI. Denies N/V/F/Ch.  Past Medical History:  Diagnosis Date   Acute cystitis    Acute metabolic encephalopathy    Anemia    Arthritis    Chronic pain    Diabetes mellitus    Fibromyalgia    GERD (gastroesophageal reflux disease)    Hepatitis C    received treatment   History of methicillin resistant staphylococcus aureus (MRSA)    Hypercholesteremia    Hypertension    IBS (irritable bowel syndrome)    Multiple sclerosis exacerbation (HCC)    Neurogenic bladder    pt denies having to cath herself-says she voids on her own as of 11-21-21   Neuropathy    Obesity    Sacral nerve stimulator present    battery is dead and has not worked in years   Stroke [facility])    pt denies this but pcp has it in their history   Tachycardia    UTI (urinary tract infection)    Vitamin D deficiency     Current Outpatient Medications:    Accu-Chek Softclix Lancets lancets, Use as directed up to 4 times daily, Disp: 100 each, Rfl: 5   amantadine (SYMMETREL) 100 MG capsule, Take 1 capsule (100 mg total) by mouth 2 (two) times daily., Disp: 60 capsule, Rfl: 11   amitriptyline (ELAVIL) 25 MG tablet, TAKE 1 TABLET(25 MG) BY MOUTH AT BEDTIME, Disp: 30 tablet, Rfl: 5   amLODipine (NORVASC) 10 MG tablet, Take 1 tablet (10 mg total) by mouth daily. (Patient taking differently: Take 10 mg by mouth at bedtime.), Disp: , Rfl:    atorvastatin (LIPITOR) 80 MG tablet, Take 1 tablet (80 mg total) by mouth daily.  (Patient taking differently: Take 80 mg by mouth every evening.), Disp: 90 tablet, Rfl: 3   baclofen (LIORESAL) 20 MG tablet, Take 20 mg by mouth 2 (two) times daily., Disp: , Rfl:    Biotin w/ Vitamins C & E (HAIR/SKIN/NAILS PO), Take 3 tablets by mouth daily., Disp: , Rfl:    Blood Glucose Monitoring Suppl (BLOOD GLUCOSE MONITOR SYSTEM) w/Device KIT, use as directed, Disp: 1 kit, Rfl: 0   Cholecalciferol (VITAMIN D-3) 125 MCG (5000 UT) TABS, Take 5,000 Units by mouth in the morning., Disp: , Rfl:    dicyclomine (BENTYL) 20 MG tablet, Take 20 mg by mouth 2 (two) times daily., Disp: , Rfl:    Dimethyl Fumarate (TECFIDERA) 240 MG CPDR, Take 1 capsule (240 mg total) by mouth 2 (two) times daily., Disp: 180 capsule, Rfl: 3   doxepin (SINEQUAN) 10 MG capsule, Take 10 mg by mouth at bedtime., Disp: , Rfl:    DULoxetine (CYMBALTA) 30 MG capsule, 30 mg 2 (two) times daily., Disp: , Rfl:    Eszopiclone (LUNESTA PO), Take 1 tablet by mouth at bedtime., Disp: , Rfl:    famotidine (PEPCID) 20 MG tablet, Take 20 mg by mouth 2 (two) times daily., Disp: , Rfl:  fluticasone (FLONASE) 50 MCG/ACT nasal spray, Place 2 sprays into the nose daily. (Patient taking differently: Place 2 sprays into the nose in the morning and at bedtime.), Disp: 16 g, Rfl: 11   glucose blood (ACCU-CHEK GUIDE) test strip, Use as instructed up to 4 times daily, Disp: 100 each, Rfl: 12   Homeopathic Products (LEG CRAMPS PO), Place 3 tablets under the tongue 2 (two) times daily. 3 am tablets and 3 pm tablets., Disp: , Rfl:    hydrALAZINE (APRESOLINE) 50 MG tablet, Take 1 tablet (50 mg total) by mouth every 8 (eight) hours., Disp: , Rfl:    ibuprofen (ADVIL) 200 MG tablet, Take 400 mg by mouth every 6 (six) hours as needed., Disp: , Rfl:    insulin glargine (LANTUS) 100 UNIT/ML Solostar Pen, Inject 40 Units into the skin daily. (Patient taking differently: Inject 40 Units into the skin every morning.), Disp: 15 mL, Rfl: 2   insulin lispro  (HUMALOG) 100 UNIT/ML KwikPen, Inject 10 Units into the skin 3 (three) times daily with meals. (Patient taking differently: Inject 3-6 Units into the skin 3 (three) times daily with meals. Sliding scale insulin), Disp: 15 mL, Rfl: 2   Insulin Pen Needle 32G X 4 MM MISC, use as directed 4 times daily, Disp: 200 each, Rfl: 2   lamoTRIgine (LAMICTAL) 200 MG tablet, TAKE 1 TABLET BY MOUTH TWICE DAILY (Patient taking differently: Take 200 mg by mouth 2 (two) times daily.), Disp: 60 tablet, Rfl: 11   lansoprazole (PREVACID) 30 MG capsule, Take 30 mg by mouth 2 (two) times daily before a meal., Disp: , Rfl:    loperamide (IMODIUM) 2 MG capsule, Take 1 capsule (2 mg total) by mouth 2 (two) times daily., Disp: 30 capsule, Rfl: 0   losartan (COZAAR) 100 MG tablet, Take 1 tablet (100 mg total) by mouth daily. (Patient taking differently: Take 100 mg by mouth at bedtime.), Disp: 90 tablet, Rfl: 3   MAGNESIUM-OXIDE 400 (240 Mg) MG tablet, Take 1 tablet by mouth daily., Disp: , Rfl:    MELATONIN MAXIMUM STRENGTH 5 MG TABS, Take 5 mg by mouth at bedtime., Disp: , Rfl:    montelukast (SINGULAIR) 10 MG tablet, Take 10 mg by mouth at bedtime., Disp: , Rfl:    NARCAN 4 MG/0.1ML LIQD nasal spray kit, Place 1 spray into the nose once as needed (overdose)., Disp: , Rfl:    Oxcarbazepine (TRILEPTAL) 300 MG tablet, Take 1 tablet (300 mg total) by mouth 2 (two) times daily., Disp: 60 tablet, Rfl: 5   oxyCODONE (ROXICODONE) 15 MG immediate release tablet, Take 15 mg by mouth in the morning, at noon, in the evening, and at bedtime. May take up to a total of 6 times per day if needed, Disp: , Rfl:    OZEMPIC, 0.25 OR 0.5 MG/DOSE, 2 MG/1.5ML SOPN, Inject into the skin once a week. Tuesdays, Disp: , Rfl:    pantoprazole (PROTONIX) 40 MG tablet, 40 mg 2 (two) times daily., Disp: , Rfl:    tiZANidine (ZANAFLEX) 2 MG tablet, Take 2 mg by mouth as needed., Disp: , Rfl:    triamterene-hydrochlorothiazide (MAXZIDE-25) 37.5-25 MG tablet,  Take 1 tablet by mouth every morning., Disp: , Rfl:    VIBERZI 100 MG TABS, Take 100 mg by mouth 2 (two) times daily., Disp: , Rfl:    vitamin B-12 (CYANOCOBALAMIN) 1000 MCG tablet, Take 1,000 mcg by mouth in the morning., Disp: , Rfl:    XIIDRA 5 % SOLN, Place 2  drops into both eyes in the morning and at bedtime., Disp: , Rfl:   Social History   Tobacco Use  Smoking Status Former   Packs/day: 0.25   Years: 12.00   Total pack years: 3.00   Types: Cigarettes   Quit date: 03/2021   Years since quitting: 0.7  Smokeless Tobacco Never  Tobacco Comments   06/13/20 1 pk per 3 days    No Known Allergies Objective:  There were no vitals filed for this visit. There is no height or weight on file to calculate BMI. Constitutional Well developed. Well nourished.  Vascular Foot warm and well perfused. Capillary refill normal to all digits.   Neurologic Normal speech. Oriented to person, place, and time. Epicritic sensation to light touch grossly present bilaterally.  Dermatologic Deis is noted to the skin.  No exposure of hardware noted.  Good range of motion noted of the ankle joint.  No purulent drainage noted no erythema noted.  Orthopedic: Tenderness to palpation noted about the surgical site.   Radiographs: 3 views of skeletally mature adult left foot: Hardware is intact no signs of backing out or loosening noted.  Good correction reduction of fracture noted.  No bony abnormalities identified. Assessment:   1. Vascular abnormality    Plan:  Patient was evaluated and treated and all questions answered.  S/p foot surgery left -Progressing as expected post-operatively. -XR: See above -WB Status: NWB LLE with walker -Sutures: Dehiscence noted to the entire incision.  No exposure of hardware noted.  Betadine wet-to-dry dressing change daily aggressively.  Patient is a high risk of losing the leg if it works worsens. -Medications: None -I will order an ABI test to assess the blood  flow.  ABIs were ordered -I will keep her on doxycycline for next 30 days.  No follow-ups on file.

## 2021-12-19 ENCOUNTER — Telehealth: Payer: Self-pay | Admitting: *Deleted

## 2021-12-19 ENCOUNTER — Other Ambulatory Visit: Payer: Self-pay | Admitting: Neurology

## 2021-12-19 NOTE — Telephone Encounter (Signed)
Patient is calling because he staples have moved, has a opening on ankle were staples are, continuing to take antibiotic, has an upcoming appointment on 08/11, schedule for sooner? please advise.

## 2021-12-19 NOTE — Telephone Encounter (Signed)
Sooner appointment,thanks

## 2021-12-23 ENCOUNTER — Ambulatory Visit (INDEPENDENT_AMBULATORY_CARE_PROVIDER_SITE_OTHER): Payer: Medicare Other | Admitting: Podiatry

## 2021-12-23 ENCOUNTER — Encounter: Payer: Self-pay | Admitting: Podiatry

## 2021-12-23 DIAGNOSIS — S82892A Other fracture of left lower leg, initial encounter for closed fracture: Secondary | ICD-10-CM

## 2021-12-23 DIAGNOSIS — Z9889 Other specified postprocedural states: Secondary | ICD-10-CM

## 2021-12-23 NOTE — Progress Notes (Signed)
Subjective:  Patient ID: [name], female    DOB: [birth year],  MRN: [id number]  Chief Complaint  Patient presents with   Wound Check    LEFT ANKLE ORIF, SYNDESMOSIS LEFT- patient states she came in office last week saw dr.patel , states it was infected but wound was not open , patient states she noticed a hole this pass Saturday. Now the wound is fully open with discharge ,staples fully intact     DOS: 11/25/2021 Procedure: ORIF of left ankle fracture  61 y.o. female returns for post-op check. Here early for worsening of wound dehiscence and staples coming out. She has been nonweightbearing to the left lower extremity.  Has been changing dressing daily with betadine. Has been taking the doxycycline daily.   Review of Systems: Negative except as noted in the HPI. Denies N/V/F/Ch.  Past Medical History:  Diagnosis Date   Acute cystitis    Acute metabolic encephalopathy    Anemia    Arthritis    Chronic pain    Diabetes mellitus    Fibromyalgia    GERD (gastroesophageal reflux disease)    Hepatitis C    received treatment   History of methicillin resistant staphylococcus aureus (MRSA)    Hypercholesteremia    Hypertension    IBS (irritable bowel syndrome)    Multiple sclerosis exacerbation (HCC)    Neurogenic bladder    pt denies having to cath herself-says she voids on her own as of 11-21-21   Neuropathy    Obesity    Sacral nerve stimulator present    battery is dead and has not worked in years   Stroke [facility])    pt denies this but pcp has it in their history   Tachycardia    UTI (urinary tract infection)    Vitamin D deficiency     Current Outpatient Medications:    Accu-Chek Softclix Lancets lancets, Use as directed up to 4 times daily, Disp: 100 each, Rfl: 5   amantadine (SYMMETREL) 100 MG capsule, Take 1 capsule (100 mg total) by mouth 2 (two) times daily., Disp: 60 capsule, Rfl: 11   amitriptyline (ELAVIL) 25 MG tablet, TAKE 1 TABLET(25 MG) BY MOUTH AT BEDTIME,  Disp: 30 tablet, Rfl: 5   amLODipine (NORVASC) 10 MG tablet, Take 1 tablet (10 mg total) by mouth daily. (Patient taking differently: Take 10 mg by mouth at bedtime.), Disp: , Rfl:    atorvastatin (LIPITOR) 80 MG tablet, Take 1 tablet (80 mg total) by mouth daily. (Patient taking differently: Take 80 mg by mouth every evening.), Disp: 90 tablet, Rfl: 3   baclofen (LIORESAL) 20 MG tablet, Take 20 mg by mouth 2 (two) times daily., Disp: , Rfl:    Biotin w/ Vitamins C & E (HAIR/SKIN/NAILS PO), Take 3 tablets by mouth daily., Disp: , Rfl:    Blood Glucose Monitoring Suppl (BLOOD GLUCOSE MONITOR SYSTEM) w/Device KIT, use as directed, Disp: 1 kit, Rfl: 0   Cholecalciferol (VITAMIN D-3) 125 MCG (5000 UT) TABS, Take 5,000 Units by mouth in the morning., Disp: , Rfl:    dicyclomine (BENTYL) 20 MG tablet, Take 20 mg by mouth 2 (two) times daily., Disp: , Rfl:    Dimethyl Fumarate (TECFIDERA) 240 MG CPDR, Take 1 capsule (240 mg total) by mouth 2 (two) times daily., Disp: 180 capsule, Rfl: 3   doxepin (SINEQUAN) 10 MG capsule, Take 10 mg by mouth at bedtime., Disp: , Rfl:    doxycycline (VIBRA-TABS) 100 MG tablet, Take 1 tablet (  100 mg total) by mouth 2 (two) times daily., Disp: 60 tablet, Rfl: 0   DULoxetine (CYMBALTA) 30 MG capsule, 30 mg 2 (two) times daily., Disp: , Rfl:    Eszopiclone (LUNESTA PO), Take 1 tablet by mouth at bedtime., Disp: , Rfl:    famotidine (PEPCID) 20 MG tablet, Take 20 mg by mouth 2 (two) times daily., Disp: , Rfl:    fluticasone (FLONASE) 50 MCG/ACT nasal spray, Place 2 sprays into the nose daily. (Patient taking differently: Place 2 sprays into the nose in the morning and at bedtime.), Disp: 16 g, Rfl: 11   glucose blood (ACCU-CHEK GUIDE) test strip, Use as instructed up to 4 times daily, Disp: 100 each, Rfl: 12   Homeopathic Products (LEG CRAMPS PO), Place 3 tablets under the tongue 2 (two) times daily. 3 am tablets and 3 pm tablets., Disp: , Rfl:    hydrALAZINE (APRESOLINE) 50 MG  tablet, Take 1 tablet (50 mg total) by mouth every 8 (eight) hours., Disp: , Rfl:    ibuprofen (ADVIL) 200 MG tablet, Take 400 mg by mouth every 6 (six) hours as needed., Disp: , Rfl:    insulin glargine (LANTUS) 100 UNIT/ML Solostar Pen, Inject 40 Units into the skin daily. (Patient taking differently: Inject 40 Units into the skin every morning.), Disp: 15 mL, Rfl: 2   insulin lispro (HUMALOG) 100 UNIT/ML KwikPen, Inject 10 Units into the skin 3 (three) times daily with meals. (Patient taking differently: Inject 3-6 Units into the skin 3 (three) times daily with meals. Sliding scale insulin), Disp: 15 mL, Rfl: 2   Insulin Pen Needle 32G X 4 MM MISC, use as directed 4 times daily, Disp: 200 each, Rfl: 2   lamoTRIgine (LAMICTAL) 200 MG tablet, TAKE 1 TABLET BY MOUTH TWICE DAILY (Patient taking differently: Take 200 mg by mouth 2 (two) times daily.), Disp: 60 tablet, Rfl: 11   lansoprazole (PREVACID) 30 MG capsule, Take 30 mg by mouth 2 (two) times daily before a meal., Disp: , Rfl:    loperamide (IMODIUM) 2 MG capsule, Take 1 capsule (2 mg total) by mouth 2 (two) times daily., Disp: 30 capsule, Rfl: 0   losartan (COZAAR) 100 MG tablet, Take 1 tablet (100 mg total) by mouth daily. (Patient taking differently: Take 100 mg by mouth at bedtime.), Disp: 90 tablet, Rfl: 3   MAGNESIUM-OXIDE 400 (240 Mg) MG tablet, Take 1 tablet by mouth daily., Disp: , Rfl:    MELATONIN MAXIMUM STRENGTH 5 MG TABS, Take 5 mg by mouth at bedtime., Disp: , Rfl:    montelukast (SINGULAIR) 10 MG tablet, Take 10 mg by mouth at bedtime., Disp: , Rfl:    NARCAN 4 MG/0.1ML LIQD nasal spray kit, Place 1 spray into the nose once as needed (overdose)., Disp: , Rfl:    Oxcarbazepine (TRILEPTAL) 300 MG tablet, TAKE 1 TABLET(300 MG) BY MOUTH TWICE DAILY, Disp: 60 tablet, Rfl: 5   oxyCODONE (ROXICODONE) 15 MG immediate release tablet, Take 15 mg by mouth in the morning, at noon, in the evening, and at bedtime. May take up to a total of 6  times per day if needed, Disp: , Rfl:    OZEMPIC, 0.25 OR 0.5 MG/DOSE, 2 MG/1.5ML SOPN, Inject into the skin once a week. Tuesdays, Disp: , Rfl:    pantoprazole (PROTONIX) 40 MG tablet, 40 mg 2 (two) times daily., Disp: , Rfl:    tiZANidine (ZANAFLEX) 2 MG tablet, Take 2 mg by mouth as needed., Disp: , Rfl:  triamterene-hydrochlorothiazide (MAXZIDE-25) 37.5-25 MG tablet, Take 1 tablet by mouth every morning., Disp: , Rfl:    VIBERZI 100 MG TABS, Take 100 mg by mouth 2 (two) times daily., Disp: , Rfl:    vitamin B-12 (CYANOCOBALAMIN) 1000 MCG tablet, Take 1,000 mcg by mouth in the morning., Disp: , Rfl:    XIIDRA 5 % SOLN, Place 2 drops into both eyes in the morning and at bedtime., Disp: , Rfl:   Social History   Tobacco Use  Smoking Status Former   Packs/day: 0.25   Years: 12.00   Total pack years: 3.00   Types: Cigarettes   Quit date: 03/2021   Years since quitting: 0.7  Smokeless Tobacco Never  Tobacco Comments   06/13/20 1 pk per 3 days    No Known Allergies Objective:  There were no vitals filed for this visit. There is no height or weight on file to calculate BMI. Constitutional Well developed. Well nourished.  Vascular Foot warm and well perfused. Capillary refill normal to all digits.   Neurologic Normal speech. Oriented to person, place, and time. Epicritic sensation to light touch grossly present bilaterally.  Dermatologic Dehiscence is noted to the skin incision site along whole incision. Granular tissue base.   No exposure of hardware noted. About 3 cm wide and along whole incision. Staples providing no tension.  Good range of motion noted of the ankle joint.  No purulent drainage noted no erythema noted.  Orthopedic: Tenderness to palpation noted about the surgical site.   Radiographs: 3 views of skeletally mature adult left foot: Hardware is intact no signs of backing out or loosening noted.  Good correction reduction of fracture noted.  No bony abnormalities  identified. Assessment:   1. Status post foot surgery   2. Closed fracture of left ankle, initial encounter     Plan:  Patient was evaluated and treated and all questions answered.  S/p foot surgery left -Progressing as expected post-operatively. -XR: See above -WB Status: NWB LLE with walker -Sutures: Dehiscence noted to the entire incision. All staples removed as none of them providing any tension.   No exposure of hardware noted.  Betadine wet-to-dry dressing change daily aggressively.  Patient is a high risk of losing the leg if it works worsens. -ABIs ordered scheduled to be done on Friday.  -Continue doxycycline  Follow-up with Dr. Posey Pronto as scheduled.   No follow-ups on file.

## 2021-12-24 ENCOUNTER — Encounter: Payer: Medicare Other | Admitting: Podiatry

## 2021-12-26 ENCOUNTER — Ambulatory Visit (INDEPENDENT_AMBULATORY_CARE_PROVIDER_SITE_OTHER): Payer: Medicare Other

## 2021-12-26 ENCOUNTER — Ambulatory Visit (INDEPENDENT_AMBULATORY_CARE_PROVIDER_SITE_OTHER): Payer: Medicare Other | Admitting: Podiatry

## 2021-12-26 DIAGNOSIS — S93432A Sprain of tibiofibular ligament of left ankle, initial encounter: Secondary | ICD-10-CM

## 2021-12-26 DIAGNOSIS — Z9889 Other specified postprocedural states: Secondary | ICD-10-CM

## 2021-12-26 DIAGNOSIS — T8130XA Disruption of wound, unspecified, initial encounter: Secondary | ICD-10-CM

## 2021-12-26 NOTE — Progress Notes (Addendum)
Subjective:  Patient ID: [name], female    DOB: 1961/03/25,  MRN: [id number]  Chief Complaint  Patient presents with   Routine Post Op    POV #3 DOS 11/25/21 --- LEFT ANKLE ORIF, SYNDESMOSIS LEFT/ pt states wound has open hole in it now    DOS: 11/25/2021 Procedure: ORIF of left ankle fracture  61 y.o. female returns for post-op check.  Patient states she is doing well.  Minimal complaints.  She has been nonweightbearing to the left lower extremity.  Bandages clean dry and intact.  Review of Systems: Negative except as noted in the HPI. Denies N/V/F/Ch.  Past Medical History:  Diagnosis Date   Acute cystitis    Acute metabolic encephalopathy    Anemia    Arthritis    Chronic pain    Diabetes mellitus    Fibromyalgia    GERD (gastroesophageal reflux disease)    Hepatitis C    received treatment   History of methicillin resistant staphylococcus aureus (MRSA)    Hypercholesteremia    Hypertension    IBS (irritable bowel syndrome)    Multiple sclerosis exacerbation (HCC)    Neurogenic bladder    pt denies having to cath herself-says she voids on her own as of 11-21-21   Neuropathy    Obesity    Sacral nerve stimulator present    battery is dead and has not worked in years   Stroke [facility])    pt denies this but pcp has it in their history   Tachycardia    UTI (urinary tract infection)    Vitamin D deficiency     Current Outpatient Medications:    Accu-Chek Softclix Lancets lancets, Use as directed up to 4 times daily, Disp: 100 each, Rfl: 5   amantadine (SYMMETREL) 100 MG capsule, Take 1 capsule (100 mg total) by mouth 2 (two) times daily., Disp: 60 capsule, Rfl: 11   amitriptyline (ELAVIL) 25 MG tablet, TAKE 1 TABLET(25 MG) BY MOUTH AT BEDTIME, Disp: 30 tablet, Rfl: 5   amLODipine (NORVASC) 10 MG tablet, Take 1 tablet (10 mg total) by mouth daily. (Patient taking differently: Take 10 mg by mouth at bedtime.), Disp: , Rfl:    atorvastatin (LIPITOR) 80 MG tablet, Take 1  tablet (80 mg total) by mouth daily. (Patient taking differently: Take 80 mg by mouth every evening.), Disp: 90 tablet, Rfl: 3   baclofen (LIORESAL) 20 MG tablet, Take 20 mg by mouth 2 (two) times daily., Disp: , Rfl:    Biotin w/ Vitamins C & E (HAIR/SKIN/NAILS PO), Take 3 tablets by mouth daily., Disp: , Rfl:    Blood Glucose Monitoring Suppl (BLOOD GLUCOSE MONITOR SYSTEM) w/Device KIT, use as directed, Disp: 1 kit, Rfl: 0   Cholecalciferol (VITAMIN D-3) 125 MCG (5000 UT) TABS, Take 5,000 Units by mouth in the morning., Disp: , Rfl:    dicyclomine (BENTYL) 20 MG tablet, Take 20 mg by mouth 2 (two) times daily., Disp: , Rfl:    Dimethyl Fumarate (TECFIDERA) 240 MG CPDR, Take 1 capsule (240 mg total) by mouth 2 (two) times daily., Disp: 180 capsule, Rfl: 3   doxepin (SINEQUAN) 10 MG capsule, Take 10 mg by mouth at bedtime., Disp: , Rfl:    doxycycline (VIBRA-TABS) 100 MG tablet, Take 1 tablet (100 mg total) by mouth 2 (two) times daily., Disp: 60 tablet, Rfl: 0   DULoxetine (CYMBALTA) 30 MG capsule, 30 mg 2 (two) times daily., Disp: , Rfl:    Eszopiclone (LUNESTA PO),  Take 1 tablet by mouth at bedtime., Disp: , Rfl:    famotidine (PEPCID) 20 MG tablet, Take 20 mg by mouth 2 (two) times daily., Disp: , Rfl:    fluticasone (FLONASE) 50 MCG/ACT nasal spray, Place 2 sprays into the nose daily. (Patient taking differently: Place 2 sprays into the nose in the morning and at bedtime.), Disp: 16 g, Rfl: 11   glucose blood (ACCU-CHEK GUIDE) test strip, Use as instructed up to 4 times daily, Disp: 100 each, Rfl: 12   Homeopathic Products (LEG CRAMPS PO), Place 3 tablets under the tongue 2 (two) times daily. 3 am tablets and 3 pm tablets., Disp: , Rfl:    hydrALAZINE (APRESOLINE) 50 MG tablet, Take 1 tablet (50 mg total) by mouth every 8 (eight) hours., Disp: , Rfl:    ibuprofen (ADVIL) 200 MG tablet, Take 400 mg by mouth every 6 (six) hours as needed., Disp: , Rfl:    insulin glargine (LANTUS) 100 UNIT/ML  Solostar Pen, Inject 40 Units into the skin daily. (Patient taking differently: Inject 40 Units into the skin every morning.), Disp: 15 mL, Rfl: 2   insulin lispro (HUMALOG) 100 UNIT/ML KwikPen, Inject 10 Units into the skin 3 (three) times daily with meals. (Patient taking differently: Inject 3-6 Units into the skin 3 (three) times daily with meals. Sliding scale insulin), Disp: 15 mL, Rfl: 2   Insulin Pen Needle 32G X 4 MM MISC, use as directed 4 times daily, Disp: 200 each, Rfl: 2   lamoTRIgine (LAMICTAL) 200 MG tablet, TAKE 1 TABLET BY MOUTH TWICE DAILY (Patient taking differently: Take 200 mg by mouth 2 (two) times daily.), Disp: 60 tablet, Rfl: 11   lansoprazole (PREVACID) 30 MG capsule, Take 30 mg by mouth 2 (two) times daily before a meal., Disp: , Rfl:    loperamide (IMODIUM) 2 MG capsule, Take 1 capsule (2 mg total) by mouth 2 (two) times daily., Disp: 30 capsule, Rfl: 0   losartan (COZAAR) 100 MG tablet, Take 1 tablet (100 mg total) by mouth daily. (Patient taking differently: Take 100 mg by mouth at bedtime.), Disp: 90 tablet, Rfl: 3   MAGNESIUM-OXIDE 400 (240 Mg) MG tablet, Take 1 tablet by mouth daily., Disp: , Rfl:    MELATONIN MAXIMUM STRENGTH 5 MG TABS, Take 5 mg by mouth at bedtime., Disp: , Rfl:    montelukast (SINGULAIR) 10 MG tablet, Take 10 mg by mouth at bedtime., Disp: , Rfl:    NARCAN 4 MG/0.1ML LIQD nasal spray kit, Place 1 spray into the nose once as needed (overdose)., Disp: , Rfl:    Oxcarbazepine (TRILEPTAL) 300 MG tablet, TAKE 1 TABLET(300 MG) BY MOUTH TWICE DAILY, Disp: 60 tablet, Rfl: 5   oxyCODONE (ROXICODONE) 15 MG immediate release tablet, Take 15 mg by mouth in the morning, at noon, in the evening, and at bedtime. May take up to a total of 6 times per day if needed, Disp: , Rfl:    OZEMPIC, 0.25 OR 0.5 MG/DOSE, 2 MG/1.5ML SOPN, Inject into the skin once a week. Tuesdays, Disp: , Rfl:    pantoprazole (PROTONIX) 40 MG tablet, 40 mg 2 (two) times daily., Disp: , Rfl:     tiZANidine (ZANAFLEX) 2 MG tablet, Take 2 mg by mouth as needed., Disp: , Rfl:    triamterene-hydrochlorothiazide (MAXZIDE-25) 37.5-25 MG tablet, Take 1 tablet by mouth every morning., Disp: , Rfl:    VIBERZI 100 MG TABS, Take 100 mg by mouth 2 (two) times daily., Disp: , Rfl:  vitamin B-12 (CYANOCOBALAMIN) 1000 MCG tablet, Take 1,000 mcg by mouth in the morning., Disp: , Rfl:    XIIDRA 5 % SOLN, Place 2 drops into both eyes in the morning and at bedtime., Disp: , Rfl:   Social History   Tobacco Use  Smoking Status Former   Packs/day: 0.25   Years: 12.00   Total pack years: 3.00   Types: Cigarettes   Quit date: 03/2021   Years since quitting: 0.7  Smokeless Tobacco Never  Tobacco Comments   06/13/20 1 pk per 3 days    No Known Allergies Objective:  There were no vitals filed for this visit. There is no height or weight on file to calculate BMI. Constitutional Well developed. Well nourished.  Vascular Foot warm and well perfused. Capillary refill normal to all digits.   Neurologic Normal speech. Oriented to person, place, and time. Epicritic sensation to light touch grossly present bilaterally.  Dermatologic Deis is noted to the skin.  No exposure of hardware noted.  Good range of motion noted of the ankle joint.  No purulent drainage noted no erythema noted.  Orthopedic: Tenderness to palpation noted about the surgical site.   Radiographs: 3 views of skeletally mature adult left foot: Hardware is intact no signs of backing out or loosening noted.  Good correction reduction of fracture noted.  No bony abnormalities identified.    Assessment:   1. Status post foot surgery    Plan:  Patient was evaluated and treated and all questions answered.  S/p foot surgery left -Progressing as expected post-operatively. -XR: See above -WB Status: NWB LLE with walker -Sutures: This is noted measuring to be 6.5 cm x 2.5 cm. -Medications: None -Awaiting ABIs PVRs -Continue  doxycycline indefinitely -Wound VAC was applied in standard technique good suction noted. -I discussed with her that she is a high risk of losing the leg if this worsens. -At this time it is important to leave the hardware as radiographically I am not seeing any infection even locally I am not seeing any infection.  Patient is achieving granulation tissue.  I would prefer to leave the hardware alone to allow osseous healing. -Her last A1c was 5.5.  No follow-ups on file.

## 2021-12-29 ENCOUNTER — Telehealth: Payer: Self-pay

## 2021-12-29 ENCOUNTER — Ambulatory Visit (INDEPENDENT_AMBULATORY_CARE_PROVIDER_SITE_OTHER): Payer: Medicare Other | Admitting: Podiatry

## 2021-12-29 DIAGNOSIS — Z9889 Other specified postprocedural states: Secondary | ICD-10-CM

## 2021-12-29 NOTE — Progress Notes (Signed)
Chief Complaint  Patient presents with   Post-op Follow-up    Post op #4 LEFT ANKLE ORIF, SYNDESMOSIS, patient states that she saw Dr. Allena Katz on 12/26/21 and was advised that she should be seen in the office if she was to see the rods in her left ankle, noticed this past Saturday night.patient is diabetic.    Subjective:  61 year old female PMHx diabetes mellitus, hepatitis C, and fibromyalgia presents today status post ORIF LT ankle fx. DOS: 11/25/2021 performed by Dr. Durwin Nora.  Patient developed postoperative dehiscence of the lateral incision site.  Currently waiting for wound VAC approval.  Patient states that she notices weekend exposure of the hardware.  Patient is currently taking doxycycline 100 mg 2 times daily and her daughter changes the dressings Betadine wet-to-dry dressings daily as well.  Presenting for further treatment and evaluation  Past Medical History:  Diagnosis Date   Acute cystitis    Acute metabolic encephalopathy    Anemia    Arthritis    Chronic pain    Diabetes mellitus    Fibromyalgia    GERD (gastroesophageal reflux disease)    Hepatitis C    received treatment   History of methicillin resistant staphylococcus aureus (MRSA)    Hypercholesteremia    Hypertension    IBS (irritable bowel syndrome)    Multiple sclerosis exacerbation (HCC)    Neurogenic bladder    pt denies having to cath herself-says she voids on her own as of 11-21-21   Neuropathy    Obesity    Sacral nerve stimulator present    battery is dead and has not worked in years   Stroke [facility])    pt denies this but pcp has it in their history   Tachycardia    UTI (urinary tract infection)    Vitamin D deficiency     Past Surgical History:  Procedure Laterality Date   ABDOMINAL HYSTERECTOMY     partial   I & D EXTREMITY Right 12/14/2020   Procedure: IRRIGATION AND DEBRIDEMENT RIGHT LOWER EXTREMITY;  Surgeon: Samson Frederic, MD;  Location: [facility] OR;  Service: Orthopedics;  Laterality:  Right;   JOINT REPLACEMENT     LTK   ORIF ANKLE FRACTURE Left 11/25/2021   Procedure: OPEN REDUCTION INTERNAL FIXATION (ORIF) LEFT ANKLE FRACTURE;  Surgeon: Candelaria Stagers, DPM;  Location: [facility] ORS;  Service: Orthopedics;  Laterality: Left;  POPLITEAL BLOCK   RADIOLOGY WITH ANESTHESIA N/A 02/18/2021   Procedure: MRI WITH ANESTHESIA CERVICAL SPINE WITH AND WITHOUT CONTRAST AND BRAIN WITH AND WITHOUT CONTRAST;  Surgeon: Radiologist, Medication, MD;  Location: [facility] OR;  Service: Radiology;  Laterality: N/A;   RADIOLOGY WITH ANESTHESIA N/A 07/07/2021   Procedure: MRI WITH ANESTHESIA;  Surgeon: Radiologist, Medication, MD;  Location: [facility] OR;  Service: Radiology;  Laterality: N/A;   SACRAL NERVE STIMULATOR PLACEMENT     [employer]   SYNDESMOSIS REPAIR Left 11/25/2021   Procedure: SYNDESMOSIS REPAIR;  Surgeon: Candelaria Stagers, DPM;  Location: [facility] ORS;  Service: Orthopedics;  Laterality: Left;   TUBAL LIGATION      No Known Allergies     Objective/Physical Exam Large wound dehiscence noted to the lateral aspect of the left ankle with hardware exposure.  The wound measures approximately 6.5 x 3.0 x 1.0 cm.  Granular wound base.  There is exposed hardware to the anterior aspect of the dehiscence.  No purulence.  No malodor.  Minimal sanguinous drainage.  Wound appears stable.  Periwound is intact  Assessment: 1.  s/p ORIF LT ankle with subsequent wound dehiscence. DOS: 11/25/2021   Plan of Care:  1. Patient was evaluated.  2.  Patient is currently on oral doxycycline however culture swab was taken today and sent to pathology for culture and sensitivities.  Patient has positive history for MRSA 3.  Betadine wet-to-dry dressings were applied.  Continue every other day 4.  Spoke with the surgeon, Dr. Allena Katz, who states that wound VAC is pending authorization.  As soon as the wound VAC is approved he will apply the negative pressure wound VAC to help with wound closure 5.  Continue nonweightbearing surgical  extremity is much as possible.  Patient uses a walker for ambulation.  She says that she is not able to use the knee scooter due to risk of falls 6.  Return to clinic neck scheduled appointment with Dr. Boyd Kerbs, DPM [facility]  Dr. Felecia Shelling, DPM    [address] [place], [place] [address]                Office [phone]  Fax [phone]

## 2021-12-29 NOTE — Telephone Encounter (Signed)
Rods showing in foot, can we get her in for a sooner appointment?

## 2021-12-29 NOTE — Telephone Encounter (Signed)
Patient wanted to let the physician know that she has not been to [facility] appointment for 3 weeks, has to go tomorrow but has cancelled all other until ankle is better.

## 2021-12-31 ENCOUNTER — Ambulatory Visit (INDEPENDENT_AMBULATORY_CARE_PROVIDER_SITE_OTHER): Payer: Medicare Other | Admitting: Podiatry

## 2021-12-31 DIAGNOSIS — T8130XA Disruption of wound, unspecified, initial encounter: Secondary | ICD-10-CM

## 2021-12-31 DIAGNOSIS — Z9889 Other specified postprocedural states: Secondary | ICD-10-CM

## 2021-12-31 MED ORDER — DOXYCYCLINE HYCLATE 100 MG PO TABS: 100.0000 mg | ORAL_TABLET | Freq: Two times a day (BID) | ORAL | 0 refills | Status: DC

## 2021-12-31 NOTE — Progress Notes (Signed)
Subjective:  Patient ID: [name], female    DOB: 09/04/1960,  MRN: [id number]  Chief Complaint  Patient presents with   Routine Post Op     Wound Vac POV #5 DOS 11/25/21 --- LEFT ANKLE ORIF, SYNDESMOSIS LEFT    DOS: 11/25/2021 Procedure: ORIF of left ankle fracture  61 y.o. female returns for post-op check.  Patient states she is doing well.  She states that the wound is about the same.  She has been walking on it.  She is noncompliant with nonweightbearing status.  She has been taking her doxycycline.  She is here to get her VAC placed on.  Review of Systems: Negative except as noted in the HPI. Denies N/V/F/Ch.  Past Medical History:  Diagnosis Date   Acute cystitis    Acute metabolic encephalopathy    Anemia    Arthritis    Chronic pain    Diabetes mellitus    Fibromyalgia    GERD (gastroesophageal reflux disease)    Hepatitis C    received treatment   History of methicillin resistant staphylococcus aureus (MRSA)    Hypercholesteremia    Hypertension    IBS (irritable bowel syndrome)    Multiple sclerosis exacerbation (HCC)    Neurogenic bladder    pt denies having to cath herself-says she voids on her own as of 11-21-21   Neuropathy    Obesity    Sacral nerve stimulator present    battery is dead and has not worked in years   Stroke [facility])    pt denies this but pcp has it in their history   Tachycardia    UTI (urinary tract infection)    Vitamin D deficiency     Current Outpatient Medications:    doxycycline (VIBRA-TABS) 100 MG tablet, Take 1 tablet (100 mg total) by mouth 2 (two) times daily., Disp: 60 tablet, Rfl: 0   Accu-Chek Softclix Lancets lancets, Use as directed up to 4 times daily, Disp: 100 each, Rfl: 5   amantadine (SYMMETREL) 100 MG capsule, Take 1 capsule (100 mg total) by mouth 2 (two) times daily., Disp: 60 capsule, Rfl: 11   amitriptyline (ELAVIL) 25 MG tablet, TAKE 1 TABLET(25 MG) BY MOUTH AT BEDTIME, Disp: 30 tablet, Rfl: 5   amLODipine  (NORVASC) 10 MG tablet, Take 1 tablet (10 mg total) by mouth daily. (Patient taking differently: Take 10 mg by mouth at bedtime.), Disp: , Rfl:    atorvastatin (LIPITOR) 80 MG tablet, Take 1 tablet (80 mg total) by mouth daily. (Patient taking differently: Take 80 mg by mouth every evening.), Disp: 90 tablet, Rfl: 3   baclofen (LIORESAL) 20 MG tablet, Take 20 mg by mouth 2 (two) times daily., Disp: , Rfl:    Biotin w/ Vitamins C & E (HAIR/SKIN/NAILS PO), Take 3 tablets by mouth daily., Disp: , Rfl:    Blood Glucose Monitoring Suppl (BLOOD GLUCOSE MONITOR SYSTEM) w/Device KIT, use as directed, Disp: 1 kit, Rfl: 0   Cholecalciferol (VITAMIN D-3) 125 MCG (5000 UT) TABS, Take 5,000 Units by mouth in the morning., Disp: , Rfl:    dicyclomine (BENTYL) 20 MG tablet, Take 20 mg by mouth 2 (two) times daily., Disp: , Rfl:    Dimethyl Fumarate (TECFIDERA) 240 MG CPDR, Take 1 capsule (240 mg total) by mouth 2 (two) times daily., Disp: 180 capsule, Rfl: 3   doxepin (SINEQUAN) 10 MG capsule, Take 10 mg by mouth at bedtime., Disp: , Rfl:    doxycycline (VIBRA-TABS) 100 MG  tablet, Take 1 tablet (100 mg total) by mouth 2 (two) times daily., Disp: 60 tablet, Rfl: 0   DULoxetine (CYMBALTA) 30 MG capsule, 30 mg 2 (two) times daily., Disp: , Rfl:    Eszopiclone (LUNESTA PO), Take 1 tablet by mouth at bedtime., Disp: , Rfl:    famotidine (PEPCID) 20 MG tablet, Take 20 mg by mouth 2 (two) times daily., Disp: , Rfl:    fluticasone (FLONASE) 50 MCG/ACT nasal spray, Place 2 sprays into the nose daily. (Patient taking differently: Place 2 sprays into the nose in the morning and at bedtime.), Disp: 16 g, Rfl: 11   glucose blood (ACCU-CHEK GUIDE) test strip, Use as instructed up to 4 times daily, Disp: 100 each, Rfl: 12   Homeopathic Products (LEG CRAMPS PO), Place 3 tablets under the tongue 2 (two) times daily. 3 am tablets and 3 pm tablets., Disp: , Rfl:    hydrALAZINE (APRESOLINE) 50 MG tablet, Take 1 tablet (50 mg total) by  mouth every 8 (eight) hours., Disp: , Rfl:    ibuprofen (ADVIL) 200 MG tablet, Take 400 mg by mouth every 6 (six) hours as needed., Disp: , Rfl:    insulin glargine (LANTUS) 100 UNIT/ML Solostar Pen, Inject 40 Units into the skin daily. (Patient taking differently: Inject 40 Units into the skin every morning.), Disp: 15 mL, Rfl: 2   insulin lispro (HUMALOG) 100 UNIT/ML KwikPen, Inject 10 Units into the skin 3 (three) times daily with meals. (Patient taking differently: Inject 3-6 Units into the skin 3 (three) times daily with meals. Sliding scale insulin), Disp: 15 mL, Rfl: 2   Insulin Pen Needle 32G X 4 MM MISC, use as directed 4 times daily, Disp: 200 each, Rfl: 2   lamoTRIgine (LAMICTAL) 200 MG tablet, TAKE 1 TABLET BY MOUTH TWICE DAILY (Patient taking differently: Take 200 mg by mouth 2 (two) times daily.), Disp: 60 tablet, Rfl: 11   lansoprazole (PREVACID) 30 MG capsule, Take 30 mg by mouth 2 (two) times daily before a meal., Disp: , Rfl:    loperamide (IMODIUM) 2 MG capsule, Take 1 capsule (2 mg total) by mouth 2 (two) times daily., Disp: 30 capsule, Rfl: 0   losartan (COZAAR) 100 MG tablet, Take 1 tablet (100 mg total) by mouth daily. (Patient taking differently: Take 100 mg by mouth at bedtime.), Disp: 90 tablet, Rfl: 3   MAGNESIUM-OXIDE 400 (240 Mg) MG tablet, Take 1 tablet by mouth daily., Disp: , Rfl:    MELATONIN MAXIMUM STRENGTH 5 MG TABS, Take 5 mg by mouth at bedtime., Disp: , Rfl:    montelukast (SINGULAIR) 10 MG tablet, Take 10 mg by mouth at bedtime., Disp: , Rfl:    NARCAN 4 MG/0.1ML LIQD nasal spray kit, Place 1 spray into the nose once as needed (overdose)., Disp: , Rfl:    Oxcarbazepine (TRILEPTAL) 300 MG tablet, TAKE 1 TABLET(300 MG) BY MOUTH TWICE DAILY, Disp: 60 tablet, Rfl: 5   oxyCODONE (ROXICODONE) 15 MG immediate release tablet, Take 15 mg by mouth in the morning, at noon, in the evening, and at bedtime. May take up to a total of 6 times per day if needed, Disp: , Rfl:     OZEMPIC, 0.25 OR 0.5 MG/DOSE, 2 MG/1.5ML SOPN, Inject into the skin once a week. Tuesdays, Disp: , Rfl:    pantoprazole (PROTONIX) 40 MG tablet, 40 mg 2 (two) times daily., Disp: , Rfl:    tiZANidine (ZANAFLEX) 2 MG tablet, Take 2 mg by mouth as needed.,  Disp: , Rfl:    triamterene-hydrochlorothiazide (MAXZIDE-25) 37.5-25 MG tablet, Take 1 tablet by mouth every morning., Disp: , Rfl:    VIBERZI 100 MG TABS, Take 100 mg by mouth 2 (two) times daily., Disp: , Rfl:    vitamin B-12 (CYANOCOBALAMIN) 1000 MCG tablet, Take 1,000 mcg by mouth in the morning., Disp: , Rfl:    XIIDRA 5 % SOLN, Place 2 drops into both eyes in the morning and at bedtime., Disp: , Rfl:   Social History   Tobacco Use  Smoking Status Former   Packs/day: 0.25   Years: 12.00   Total pack years: 3.00   Types: Cigarettes   Quit date: 03/2021   Years since quitting: 0.7  Smokeless Tobacco Never  Tobacco Comments   06/13/20 1 pk per 3 days    No Known Allergies Objective:  There were no vitals filed for this visit. There is no height or weight on file to calculate BMI. Constitutional Well developed. Well nourished.  Vascular Foot warm and well perfused. Capillary refill normal to all digits.   Neurologic Normal speech. Oriented to person, place, and time. Epicritic sensation to light touch grossly present bilaterally.  Dermatologic Wound dehiscence is noted as measured below.  There is partial exposure of hardware noted.  No purulent drainage noted.  Good range of motion noted of the ankle joint.  No purulent drainage noted no erythema noted.  Orthopedic: Tenderness to palpation noted about the surgical site.   Radiographs: 3 views of skeletally mature adult left foot: Hardware is intact no signs of backing out noted from the proximal screw we will continue to keep an eye out on it.  Good correction reduction of fracture noted.  No bony abnormalities identified.    Assessment:   1. Status post left foot surgery    2. Wound dehiscence    Plan:  Patient was evaluated and treated and all questions answered.  S/p foot surgery left -Progressing as expected post-operatively. -XR: See above -WB Status: NWB LLE with walker.  However patient has been noncompliant with nonweightbearing status or has been walking with a walker. -Sutures: This is noted measuring to be 6.5 cm x 2.5 cm. -Medications: None -Awaiting ABIs PVRs -Cultures were taken with no growth so far. -Continue doxycycline indefinitely -Wound VAC was applied in standard technique.  We will follow her every Monday Wednesday Friday to schedule her for wound VAC changes.  I will also have her daughter come see me and we can instruct her how to change wound VAC as well. -Attempted to find home health nurse however no one is available -I discussed with her that she is a high risk of losing the leg if this worsens. -At this time it is important to leave the hardware as radiographically I am not seeing any infection even locally I am not seeing any infection.  Patient is achieving granulation tissue.  I would prefer to leave the hardware alone to allow osseous healing. -Her last A1c was 5.5. -I ordered ESR CRP BMP CBC and vitamin D.  No follow-ups on file.

## 2022-01-01 LAB — WOUND CULTURE
MICRO NUMBER:: 13775279
SPECIMEN QUALITY:: ADEQUATE

## 2022-01-02 ENCOUNTER — Ambulatory Visit: Payer: Medicare Other | Admitting: Podiatry

## 2022-01-02 ENCOUNTER — Other Ambulatory Visit: Payer: Self-pay | Admitting: Podiatry

## 2022-01-02 ENCOUNTER — Ambulatory Visit (INDEPENDENT_AMBULATORY_CARE_PROVIDER_SITE_OTHER): Payer: Medicare Other | Admitting: Podiatry

## 2022-01-02 DIAGNOSIS — T8130XA Disruption of wound, unspecified, initial encounter: Secondary | ICD-10-CM

## 2022-01-02 DIAGNOSIS — Z9889 Other specified postprocedural states: Secondary | ICD-10-CM

## 2022-01-03 LAB — COMPREHENSIVE METABOLIC PANEL
ALT: 22 IU/L (ref 0–32)
AST: 16 IU/L (ref 0–40)
Albumin/Globulin Ratio: 1.6 (ref 1.2–2.2)
Albumin: 4.2 g/dL (ref 3.9–4.9)
Alkaline Phosphatase: 88 IU/L (ref 44–121)
BUN/Creatinine Ratio: 15 (ref 12–28)
BUN: 15 mg/dL (ref 8–27)
Bilirubin Total: 0.2 mg/dL (ref 0.0–1.2)
CO2: 23 mmol/L (ref 20–29)
Calcium: 9.6 mg/dL (ref 8.7–10.3)
Chloride: 107 mmol/L — ABNORMAL HIGH (ref 96–106)
Creatinine, Ser: 0.97 mg/dL (ref 0.57–1.00)
Globulin, Total: 2.6 g/dL (ref 1.5–4.5)
Glucose: 70 mg/dL (ref 70–99)
Potassium: 3.8 mmol/L (ref 3.5–5.2)
Sodium: 146 mmol/L — ABNORMAL HIGH (ref 134–144)
Total Protein: 6.8 g/dL (ref 6.0–8.5)
eGFR: 66 mL/min/{1.73_m2} (ref >59)

## 2022-01-03 LAB — CBC WITH DIFFERENTIAL/PLATELET
Basophils Absolute: 0 10*3/uL (ref 0.0–0.2)
Basos: 1 %
EOS (ABSOLUTE): 0.2 10*3/uL (ref 0.0–0.4)
Eos: 3 %
Hematocrit: 35.6 % (ref 34.0–46.6)
Hemoglobin: 12.5 g/dL (ref 11.1–15.9)
Immature Grans (Abs): 0 10*3/uL (ref 0.0–0.1)
Immature Granulocytes: 0 %
Lymphocytes Absolute: 2.6 10*3/uL (ref 0.7–3.1)
Lymphs: 37 %
MCH: 30.1 pg (ref 26.6–33.0)
MCHC: 35.1 g/dL (ref 31.5–35.7)
MCV: 86 fL (ref 79–97)
Monocytes Absolute: 0.7 10*3/uL (ref 0.1–0.9)
Monocytes: 10 %
Neutrophils Absolute: 3.5 10*3/uL (ref 1.4–7.0)
Neutrophils: 49 %
Platelets: 233 10*3/uL (ref 150–450)
RBC: 4.15 x10E6/uL (ref 3.77–5.28)
RDW: 12.3 % (ref 11.7–15.4)
WBC: 7.1 10*3/uL (ref 3.4–10.8)

## 2022-01-03 LAB — VITAMIN D 25 HYDROXY (VIT D DEFICIENCY, FRACTURES): Vit D, 25-Hydroxy: 43.2 ng/mL (ref 30.0–100.0)

## 2022-01-03 LAB — SEDIMENTATION RATE: Sed Rate: 15 mm/hr (ref 0–40)

## 2022-01-03 LAB — C-REACTIVE PROTEIN: CRP: 3 mg/L (ref 0–10)

## 2022-01-05 NOTE — Telephone Encounter (Signed)
Patient's first Ocrevus infusion was on 12/30/2021.

## 2022-01-06 ENCOUNTER — Telehealth: Payer: Self-pay | Admitting: Podiatry

## 2022-01-06 NOTE — Telephone Encounter (Signed)
Pt is calling to see if she need to come in for her POV #5 on 01/14/22 or if she just need to come in on 01/23/22 for the 2 week follow up. She states her daughter is changing her wound now.  Please advise.

## 2022-01-09 ENCOUNTER — Encounter (HOSPITAL_COMMUNITY): Payer: Medicare Other

## 2022-01-09 ENCOUNTER — Encounter: Payer: Medicare Other | Admitting: Podiatry

## 2022-01-09 ENCOUNTER — Ambulatory Visit (HOSPITAL_COMMUNITY)
Admission: RE | Admit: 2022-01-09 | Discharge: 2022-01-09 | Disposition: A | Discharge status: Home / Self Care | Payer: Medicare Other | Source: Ambulatory Visit | Attending: Podiatry | Admitting: Podiatry

## 2022-01-09 DIAGNOSIS — I999 Unspecified disorder of circulatory system: Secondary | ICD-10-CM | POA: Insufficient documentation

## 2022-01-09 MED ORDER — SULFAMETHOXAZOLE-TRIMETHOPRIM 800-160 MG PO TABS: 1.0000 | ORAL_TABLET | Freq: Two times a day (BID) | ORAL | 0 refills | Status: DC

## 2022-01-09 NOTE — Progress Notes (Signed)
Subjective:  Patient ID: [name], female    DOB: 09-26-1960,  MRN: [id number]  Chief Complaint  Patient presents with   Wound Check    Wound check     DOS: 11/25/2021 Procedure: ORIF of left ankle fracture  61 y.o. female returns for post-op check.  Patient states she is doing well.  She states that the wound is about the same.  She has been walking on it.  She is noncompliant with nonweightbearing status.  She has been taking her doxycycline.  VAC has been getting applied.  The incision is doing well.  More granulation tissue noted. Review of Systems: Negative except as noted in the HPI. Denies N/V/F/Ch.  Past Medical History:  Diagnosis Date   Acute cystitis    Acute metabolic encephalopathy    Anemia    Arthritis    Chronic pain    Diabetes mellitus    Fibromyalgia    GERD (gastroesophageal reflux disease)    Hepatitis C    received treatment   History of methicillin resistant staphylococcus aureus (MRSA)    Hypercholesteremia    Hypertension    IBS (irritable bowel syndrome)    Multiple sclerosis exacerbation (HCC)    Neurogenic bladder    pt denies having to cath herself-says she voids on her own as of 11-21-21   Neuropathy    Obesity    Sacral nerve stimulator present    battery is dead and has not worked in years   Stroke [facility])    pt denies this but pcp has it in their history   Tachycardia    UTI (urinary tract infection)    Vitamin D deficiency     Current Outpatient Medications:    Accu-Chek Softclix Lancets lancets, Use as directed up to 4 times daily, Disp: 100 each, Rfl: 5   amantadine (SYMMETREL) 100 MG capsule, Take 1 capsule (100 mg total) by mouth 2 (two) times daily., Disp: 60 capsule, Rfl: 11   amitriptyline (ELAVIL) 25 MG tablet, TAKE 1 TABLET(25 MG) BY MOUTH AT BEDTIME, Disp: 30 tablet, Rfl: 5   amLODipine (NORVASC) 10 MG tablet, Take 1 tablet (10 mg total) by mouth daily. (Patient taking differently: Take 10 mg by mouth at bedtime.), Disp: ,  Rfl:    atorvastatin (LIPITOR) 80 MG tablet, Take 1 tablet (80 mg total) by mouth daily. (Patient taking differently: Take 80 mg by mouth every evening.), Disp: 90 tablet, Rfl: 3   baclofen (LIORESAL) 20 MG tablet, Take 20 mg by mouth 2 (two) times daily., Disp: , Rfl:    Biotin w/ Vitamins C & E (HAIR/SKIN/NAILS PO), Take 3 tablets by mouth daily., Disp: , Rfl:    Blood Glucose Monitoring Suppl (BLOOD GLUCOSE MONITOR SYSTEM) w/Device KIT, use as directed, Disp: 1 kit, Rfl: 0   Cholecalciferol (VITAMIN D-3) 125 MCG (5000 UT) TABS, Take 5,000 Units by mouth in the morning., Disp: , Rfl:    dicyclomine (BENTYL) 20 MG tablet, Take 20 mg by mouth 2 (two) times daily., Disp: , Rfl:    Dimethyl Fumarate (TECFIDERA) 240 MG CPDR, Take 1 capsule (240 mg total) by mouth 2 (two) times daily., Disp: 180 capsule, Rfl: 3   doxepin (SINEQUAN) 10 MG capsule, Take 10 mg by mouth at bedtime., Disp: , Rfl:    doxycycline (VIBRA-TABS) 100 MG tablet, Take 1 tablet (100 mg total) by mouth 2 (two) times daily., Disp: 60 tablet, Rfl: 0   doxycycline (VIBRA-TABS) 100 MG tablet, Take 1 tablet (100  mg total) by mouth 2 (two) times daily., Disp: 60 tablet, Rfl: 0   DULoxetine (CYMBALTA) 30 MG capsule, 30 mg 2 (two) times daily., Disp: , Rfl:    Eszopiclone (LUNESTA PO), Take 1 tablet by mouth at bedtime., Disp: , Rfl:    famotidine (PEPCID) 20 MG tablet, Take 20 mg by mouth 2 (two) times daily., Disp: , Rfl:    fluticasone (FLONASE) 50 MCG/ACT nasal spray, Place 2 sprays into the nose daily. (Patient taking differently: Place 2 sprays into the nose in the morning and at bedtime.), Disp: 16 g, Rfl: 11   glucose blood (ACCU-CHEK GUIDE) test strip, Use as instructed up to 4 times daily, Disp: 100 each, Rfl: 12   Homeopathic Products (LEG CRAMPS PO), Place 3 tablets under the tongue 2 (two) times daily. 3 am tablets and 3 pm tablets., Disp: , Rfl:    hydrALAZINE (APRESOLINE) 50 MG tablet, Take 1 tablet (50 mg total) by mouth every  8 (eight) hours., Disp: , Rfl:    ibuprofen (ADVIL) 200 MG tablet, Take 400 mg by mouth every 6 (six) hours as needed., Disp: , Rfl:    insulin glargine (LANTUS) 100 UNIT/ML Solostar Pen, Inject 40 Units into the skin daily. (Patient taking differently: Inject 40 Units into the skin every morning.), Disp: 15 mL, Rfl: 2   insulin lispro (HUMALOG) 100 UNIT/ML KwikPen, Inject 10 Units into the skin 3 (three) times daily with meals. (Patient taking differently: Inject 3-6 Units into the skin 3 (three) times daily with meals. Sliding scale insulin), Disp: 15 mL, Rfl: 2   Insulin Pen Needle 32G X 4 MM MISC, use as directed 4 times daily, Disp: 200 each, Rfl: 2   lamoTRIgine (LAMICTAL) 200 MG tablet, TAKE 1 TABLET BY MOUTH TWICE DAILY (Patient taking differently: Take 200 mg by mouth 2 (two) times daily.), Disp: 60 tablet, Rfl: 11   lansoprazole (PREVACID) 30 MG capsule, Take 30 mg by mouth 2 (two) times daily before a meal., Disp: , Rfl:    loperamide (IMODIUM) 2 MG capsule, Take 1 capsule (2 mg total) by mouth 2 (two) times daily., Disp: 30 capsule, Rfl: 0   losartan (COZAAR) 100 MG tablet, Take 1 tablet (100 mg total) by mouth daily. (Patient taking differently: Take 100 mg by mouth at bedtime.), Disp: 90 tablet, Rfl: 3   MAGNESIUM-OXIDE 400 (240 Mg) MG tablet, Take 1 tablet by mouth daily., Disp: , Rfl:    MELATONIN MAXIMUM STRENGTH 5 MG TABS, Take 5 mg by mouth at bedtime., Disp: , Rfl:    montelukast (SINGULAIR) 10 MG tablet, Take 10 mg by mouth at bedtime., Disp: , Rfl:    NARCAN 4 MG/0.1ML LIQD nasal spray kit, Place 1 spray into the nose once as needed (overdose)., Disp: , Rfl:    Oxcarbazepine (TRILEPTAL) 300 MG tablet, TAKE 1 TABLET(300 MG) BY MOUTH TWICE DAILY, Disp: 60 tablet, Rfl: 5   oxyCODONE (ROXICODONE) 15 MG immediate release tablet, Take 15 mg by mouth in the morning, at noon, in the evening, and at bedtime. May take up to a total of 6 times per day if needed, Disp: , Rfl:    OZEMPIC,  0.25 OR 0.5 MG/DOSE, 2 MG/1.5ML SOPN, Inject into the skin once a week. Tuesdays, Disp: , Rfl:    pantoprazole (PROTONIX) 40 MG tablet, 40 mg 2 (two) times daily., Disp: , Rfl:    tiZANidine (ZANAFLEX) 2 MG tablet, Take 2 mg by mouth as needed., Disp: , Rfl:  triamterene-hydrochlorothiazide (MAXZIDE-25) 37.5-25 MG tablet, Take 1 tablet by mouth every morning., Disp: , Rfl:    VIBERZI 100 MG TABS, Take 100 mg by mouth 2 (two) times daily., Disp: , Rfl:    vitamin B-12 (CYANOCOBALAMIN) 1000 MCG tablet, Take 1,000 mcg by mouth in the morning., Disp: , Rfl:    XIIDRA 5 % SOLN, Place 2 drops into both eyes in the morning and at bedtime., Disp: , Rfl:   Social History   Tobacco Use  Smoking Status Former   Packs/day: 0.25   Years: 12.00   Total pack years: 3.00   Types: Cigarettes   Quit date: 03/2021   Years since quitting: 0.8  Smokeless Tobacco Never  Tobacco Comments   06/13/20 1 pk per 3 days    No Known Allergies Objective:  There were no vitals filed for this visit. There is no height or weight on file to calculate BMI. Constitutional Well developed. Well nourished.  Vascular Foot warm and well perfused. Capillary refill normal to all digits.   Neurologic Normal speech. Oriented to person, place, and time. Epicritic sensation to light touch grossly present bilaterally.  Dermatologic Wound dehiscence is noted as measured below.  There is partial exposure of hardware noted.  No purulent drainage noted.  Good range of motion noted of the ankle joint.  No purulent drainage noted no erythema noted.  Orthopedic: Tenderness to palpation noted about the surgical site.   Radiographs: 3 views of skeletally mature adult left foot: Hardware is intact no signs of backing out noted from the proximal screw we will continue to keep an eye out on it.  Good correction reduction of fracture noted.  No bony abnormalities identified.     Assessment:   No diagnosis found.  Plan:  Patient  was evaluated and treated and all questions answered.  S/p foot surgery left -Progressing as expected post-operatively. -XR: See above -WB Status: NWB LLE with walker.  However patient has been noncompliant with nonweightbearing status or has been walking with a walker. -Sutures: This is noted measuring to be 6.5 cm x 2.5 cm. -Medications: None -Awaiting ABIs PVRs -Cultures were taken with no growth so far. -Wound VAC was reapplied.  More granulation tissue is noted -Attempted to find home health nurse however no one is available -I discussed with her that she is a high risk of losing the leg if this worsens. -At this time it is important to leave the hardware as radiographically I am not seeing any infection even locally I am not seeing any infection.  Patient is achieving granulation tissue.  I would prefer to leave the hardware alone to allow osseous healing. -Her last A1c was 5.5. -All labs within normal limits -The superficial swab of the culture is growing Enterobacter cloaca.  We will plan on switching her antibiotics from doxycycline to Bactrim  No follow-ups on file.

## 2022-01-12 ENCOUNTER — Other Ambulatory Visit: Payer: Self-pay | Admitting: Podiatry

## 2022-01-12 DIAGNOSIS — S91001A Unspecified open wound, right ankle, initial encounter: Secondary | ICD-10-CM

## 2022-01-13 ENCOUNTER — Telehealth: Payer: Self-pay | Admitting: Neurology

## 2022-01-13 NOTE — Telephone Encounter (Signed)
[name] is calling from [facility] and wants to speak with nurse about Pt not taking Dimethyl Fumarate (TECFIDERA) 240 MG CPDR anymore. [name] is requesting a call back from nurse

## 2022-01-13 NOTE — Telephone Encounter (Signed)
Pt was switched around June to start ocrevus. She completed a 3 wk wash out period of the dimethyl fumarate. Scheduled for 2nd part of the ocrevus infusion today.  I have removed the dimethyl fumarate off medication list Spoke with [employer]support/administrative officer. Provided the information to them that the patient is not longer on the medication and has been switched to alternative therapy.

## 2022-01-14 ENCOUNTER — Encounter: Payer: Medicare Other | Admitting: Podiatry

## 2022-01-14 ENCOUNTER — Telehealth: Payer: Self-pay | Admitting: Podiatry

## 2022-01-14 ENCOUNTER — Telehealth: Payer: Self-pay | Admitting: *Deleted

## 2022-01-14 MED ORDER — SULFAMETHOXAZOLE-TRIMETHOPRIM 800-160 MG PO TABS: 1.0000 | ORAL_TABLET | Freq: Two times a day (BID) | ORAL | 0 refills | Status: DC

## 2022-01-14 NOTE — Telephone Encounter (Signed)
Called patient, no answer, left voice message that a new antibiotic is sent to pharmacy based on culture, stopped taking the doxycycline.  I called the daughter as well giving the instructions per physician, verbalized understanding , will give message to mother.

## 2022-01-14 NOTE — Telephone Encounter (Signed)
Please send antibiotic to [facility] on [address] - [place], the one that the bactrim was sent to is temporarily closed .  She has not been able to get new antibiotics because order wont transfer.

## 2022-01-14 NOTE — Telephone Encounter (Signed)
-----   Message from Candelaria Stagers, DPM sent at 01/09/2022  8:41 AM EDT ----- Regarding: Changing antibiotics Hi Audryanna Zurita,  Can you call this patient to let her know that I am changing her antibiotics from doxycycline to Bactrim based on culture. She can stop taking doxycycline and start taking Bactrim  Thanks

## 2022-01-15 ENCOUNTER — Other Ambulatory Visit: Payer: Self-pay

## 2022-01-15 NOTE — Telephone Encounter (Signed)
Medication resent

## 2022-01-16 NOTE — Telephone Encounter (Signed)
Patient is calling for status of antibiotic being sent to [facility] [address]. Explained that the medication was sent, 01/14/22, verbalized understanding and said that she will contact.

## 2022-01-23 ENCOUNTER — Other Ambulatory Visit: Payer: Self-pay | Admitting: Neurology

## 2022-01-23 ENCOUNTER — Ambulatory Visit (INDEPENDENT_AMBULATORY_CARE_PROVIDER_SITE_OTHER): Payer: Medicare Other | Admitting: Podiatry

## 2022-01-23 DIAGNOSIS — Z9889 Other specified postprocedural states: Secondary | ICD-10-CM

## 2022-01-23 DIAGNOSIS — T8130XA Disruption of wound, unspecified, initial encounter: Secondary | ICD-10-CM

## 2022-01-23 NOTE — Progress Notes (Signed)
Subjective:  Patient ID: [name], female    DOB: November 03, 1960,  MRN: [id number]  Chief Complaint  Patient presents with   Routine Post Op    DOS: 11/25/2021 Procedure: ORIF of left ankle fracture  61 y.o. female returns for post-op check.  Patient states she is doing well.  She states that the wound is about the same.  She states she is trying to be nonweightbearing she denies any other acute complaint she has been getting the VAC applied. Review of Systems: Negative except as noted in the HPI. Denies N/V/F/Ch.  Past Medical History:  Diagnosis Date   Acute cystitis    Acute metabolic encephalopathy    Anemia    Arthritis    Chronic pain    Diabetes mellitus    Fibromyalgia    GERD (gastroesophageal reflux disease)    Hepatitis C    received treatment   History of methicillin resistant staphylococcus aureus (MRSA)    Hypercholesteremia    Hypertension    IBS (irritable bowel syndrome)    Multiple sclerosis exacerbation (HCC)    Neurogenic bladder    pt denies having to cath herself-says she voids on her own as of 11-21-21   Neuropathy    Obesity    Sacral nerve stimulator present    battery is dead and has not worked in years   Stroke [facility])    pt denies this but pcp has it in their history   Tachycardia    UTI (urinary tract infection)    Vitamin D deficiency     Current Outpatient Medications:    Accu-Chek Softclix Lancets lancets, Use as directed up to 4 times daily, Disp: 100 each, Rfl: 5   amantadine (SYMMETREL) 100 MG capsule, Take 1 capsule (100 mg total) by mouth 2 (two) times daily., Disp: 60 capsule, Rfl: 11   amitriptyline (ELAVIL) 25 MG tablet, TAKE 1 TABLET(25 MG) BY MOUTH AT BEDTIME, Disp: 30 tablet, Rfl: 5   amLODipine (NORVASC) 10 MG tablet, Take 1 tablet (10 mg total) by mouth daily. (Patient taking differently: Take 10 mg by mouth at bedtime.), Disp: , Rfl:    atorvastatin (LIPITOR) 80 MG tablet, Take 1 tablet (80 mg total) by mouth daily. (Patient  taking differently: Take 80 mg by mouth every evening.), Disp: 90 tablet, Rfl: 3   baclofen (LIORESAL) 20 MG tablet, Take 20 mg by mouth 2 (two) times daily., Disp: , Rfl:    Biotin w/ Vitamins C & E (HAIR/SKIN/NAILS PO), Take 3 tablets by mouth daily., Disp: , Rfl:    Blood Glucose Monitoring Suppl (BLOOD GLUCOSE MONITOR SYSTEM) w/Device KIT, use as directed, Disp: 1 kit, Rfl: 0   Cholecalciferol (VITAMIN D-3) 125 MCG (5000 UT) TABS, Take 5,000 Units by mouth in the morning., Disp: , Rfl:    dicyclomine (BENTYL) 20 MG tablet, Take 20 mg by mouth 2 (two) times daily., Disp: , Rfl:    doxepin (SINEQUAN) 10 MG capsule, Take 10 mg by mouth at bedtime., Disp: , Rfl:    doxycycline (VIBRA-TABS) 100 MG tablet, Take 1 tablet (100 mg total) by mouth 2 (two) times daily., Disp: 60 tablet, Rfl: 0   DULoxetine (CYMBALTA) 30 MG capsule, 30 mg 2 (two) times daily., Disp: , Rfl:    Eszopiclone (LUNESTA PO), Take 1 tablet by mouth at bedtime., Disp: , Rfl:    famotidine (PEPCID) 20 MG tablet, Take 20 mg by mouth 2 (two) times daily., Disp: , Rfl:    fluticasone (FLONASE)  50 MCG/ACT nasal spray, Place 2 sprays into the nose daily. (Patient taking differently: Place 2 sprays into the nose in the morning and at bedtime.), Disp: 16 g, Rfl: 11   glucose blood (ACCU-CHEK GUIDE) test strip, Use as instructed up to 4 times daily, Disp: 100 each, Rfl: 12   Homeopathic Products (LEG CRAMPS PO), Place 3 tablets under the tongue 2 (two) times daily. 3 am tablets and 3 pm tablets., Disp: , Rfl:    hydrALAZINE (APRESOLINE) 50 MG tablet, Take 1 tablet (50 mg total) by mouth every 8 (eight) hours., Disp: , Rfl:    ibuprofen (ADVIL) 200 MG tablet, Take 400 mg by mouth every 6 (six) hours as needed., Disp: , Rfl:    insulin glargine (LANTUS) 100 UNIT/ML Solostar Pen, Inject 40 Units into the skin daily. (Patient taking differently: Inject 40 Units into the skin every morning.), Disp: 15 mL, Rfl: 2   insulin lispro (HUMALOG) 100  UNIT/ML KwikPen, Inject 10 Units into the skin 3 (three) times daily with meals. (Patient taking differently: Inject 3-6 Units into the skin 3 (three) times daily with meals. Sliding scale insulin), Disp: 15 mL, Rfl: 2   Insulin Pen Needle 32G X 4 MM MISC, use as directed 4 times daily, Disp: 200 each, Rfl: 2   lamoTRIgine (LAMICTAL) 200 MG tablet, TAKE 1 TABLET BY MOUTH TWICE DAILY (Patient taking differently: Take 200 mg by mouth 2 (two) times daily.), Disp: 60 tablet, Rfl: 11   lansoprazole (PREVACID) 30 MG capsule, Take 30 mg by mouth 2 (two) times daily before a meal., Disp: , Rfl:    loperamide (IMODIUM) 2 MG capsule, Take 1 capsule (2 mg total) by mouth 2 (two) times daily., Disp: 30 capsule, Rfl: 0   losartan (COZAAR) 100 MG tablet, Take 1 tablet (100 mg total) by mouth daily. (Patient taking differently: Take 100 mg by mouth at bedtime.), Disp: 90 tablet, Rfl: 3   MAGNESIUM-OXIDE 400 (240 Mg) MG tablet, Take 1 tablet by mouth daily., Disp: , Rfl:    MELATONIN MAXIMUM STRENGTH 5 MG TABS, Take 5 mg by mouth at bedtime., Disp: , Rfl:    montelukast (SINGULAIR) 10 MG tablet, Take 10 mg by mouth at bedtime., Disp: , Rfl:    NARCAN 4 MG/0.1ML LIQD nasal spray kit, Place 1 spray into the nose once as needed (overdose)., Disp: , Rfl:    Oxcarbazepine (TRILEPTAL) 300 MG tablet, TAKE 1 TABLET(300 MG) BY MOUTH TWICE DAILY, Disp: 60 tablet, Rfl: 5   oxyCODONE (ROXICODONE) 15 MG immediate release tablet, Take 15 mg by mouth in the morning, at noon, in the evening, and at bedtime. May take up to a total of 6 times per day if needed, Disp: , Rfl:    OZEMPIC, 0.25 OR 0.5 MG/DOSE, 2 MG/1.5ML SOPN, Inject into the skin once a week. Tuesdays, Disp: , Rfl:    pantoprazole (PROTONIX) 40 MG tablet, 40 mg 2 (two) times daily., Disp: , Rfl:    sulfamethoxazole-trimethoprim (BACTRIM DS) 800-160 MG tablet, Take 1 tablet by mouth 2 (two) times daily., Disp: 60 tablet, Rfl: 0   sulfamethoxazole-trimethoprim (BACTRIM DS)  800-160 MG tablet, Take 1 tablet by mouth 2 (two) times daily., Disp: 28 tablet, Rfl: 0   tiZANidine (ZANAFLEX) 2 MG tablet, Take 2 mg by mouth as needed., Disp: , Rfl:    triamterene-hydrochlorothiazide (MAXZIDE-25) 37.5-25 MG tablet, Take 1 tablet by mouth every morning., Disp: , Rfl:    VIBERZI 100 MG TABS, Take 100 mg  by mouth 2 (two) times daily., Disp: , Rfl:    vitamin B-12 (CYANOCOBALAMIN) 1000 MCG tablet, Take 1,000 mcg by mouth in the morning., Disp: , Rfl:    XIIDRA 5 % SOLN, Place 2 drops into both eyes in the morning and at bedtime., Disp: , Rfl:   Social History   Tobacco Use  Smoking Status Former   Packs/day: 0.25   Years: 12.00   Total pack years: 3.00   Types: Cigarettes   Quit date: 03/2021   Years since quitting: 0.8  Smokeless Tobacco Never  Tobacco Comments   06/13/20 1 pk per 3 days    No Known Allergies Objective:  There were no vitals filed for this visit. There is no height or weight on file to calculate BMI. Constitutional Well developed. Well nourished.  Vascular Foot warm and well perfused. Capillary refill normal to all digits.   Neurologic Normal speech. Oriented to person, place, and time. Epicritic sensation to light touch grossly present bilaterally.  Dermatologic Wound dehiscence is noted as measured below.  There is no further exposure of hardware noted good granulation tissue noted and adequate coverage noted.  Orthopedic: Very mild tenderness to palpation noted about the surgical site.   Radiographs: 3 views of skeletally mature adult left foot: Hardware is intact no signs of backing out noted from the proximal screw we will continue to keep an eye out on it.  Good correction reduction of fracture noted.  No bony abnormalities identified.      Assessment:   1. Wound dehiscence   2. Status post left foot surgery     Plan:  Patient was evaluated and treated and all questions answered.  S/p foot surgery left -Progressing as  expected post-operatively. -XR: See above -WB Status: NWB LLE with walker.  However patient has been noncompliant with nonweightbearing status or has been walking with a walker. -Sutures: This is noted measuring to be 5 cm x 1 m. -Medications: None -ABIs PVRs were reviewed which show some noncompressible arteries however the waveforms are normal.  Clinically the wound is improving considerably.  We will continue to monitor and if there is no improvement or stagnation we discussed vascular consultation -Cultures were taken with no growth so far. -Wound VAC was reapplied.  More granulation tissue is noted -Attempted to find home health nurse however no one is available -I discussed with her that she is a high risk of losing the leg if this worsens. -At this time there is very good granulation tissue covering the hardware and the wound is decreasing considerably.  Continue wound VAC -Her last A1c was 5.5. -All labs within normal limits -Continue antibiotics  No follow-ups on file.   Wound decreasing.  Surgical shoe.  Ambulate as tolerated

## 2022-02-13 ENCOUNTER — Ambulatory Visit (INDEPENDENT_AMBULATORY_CARE_PROVIDER_SITE_OTHER): Payer: Medicare Other

## 2022-02-13 ENCOUNTER — Ambulatory Visit (INDEPENDENT_AMBULATORY_CARE_PROVIDER_SITE_OTHER): Payer: Medicare Other | Admitting: Podiatry

## 2022-02-13 DIAGNOSIS — Z9889 Other specified postprocedural states: Secondary | ICD-10-CM

## 2022-02-13 DIAGNOSIS — T8130XA Disruption of wound, unspecified, initial encounter: Secondary | ICD-10-CM

## 2022-02-13 NOTE — Progress Notes (Signed)
Subjective:  Patient ID: [name], female    DOB: [birth year],  MRN: [id number]  Chief Complaint  Patient presents with   Wound Check    DOS: 11/25/2021 Procedure: ORIF of left ankle fracture  61 y.o. female returns for post-op check.  Patient states she is doing well.  She states that the wound is about the same.  She has been weightbearing as tolerated   Review of Systems: Negative except as noted in the HPI. Denies N/V/F/Ch.  Past Medical History:  Diagnosis Date   Acute cystitis    Acute metabolic encephalopathy    Anemia    Arthritis    Chronic pain    Diabetes mellitus    Fibromyalgia    GERD (gastroesophageal reflux disease)    Hepatitis C    received treatment   History of methicillin resistant staphylococcus aureus (MRSA)    Hypercholesteremia    Hypertension    IBS (irritable bowel syndrome)    Multiple sclerosis exacerbation (HCC)    Neurogenic bladder    pt denies having to cath herself-says she voids on her own as of 11-21-21   Neuropathy    Obesity    Sacral nerve stimulator present    battery is dead and has not worked in years   Stroke [facility])    pt denies this but pcp has it in their history   Tachycardia    UTI (urinary tract infection)    Vitamin D deficiency     Current Outpatient Medications:    Accu-Chek Softclix Lancets lancets, Use as directed up to 4 times daily, Disp: 100 each, Rfl: 5   amantadine (SYMMETREL) 100 MG capsule, Take 1 capsule (100 mg total) by mouth 2 (two) times daily., Disp: 60 capsule, Rfl: 11   amitriptyline (ELAVIL) 25 MG tablet, TAKE 1 TABLET(25 MG) BY MOUTH AT BEDTIME, Disp: 30 tablet, Rfl: 5   amLODipine (NORVASC) 10 MG tablet, Take 1 tablet (10 mg total) by mouth daily. (Patient taking differently: Take 10 mg by mouth at bedtime.), Disp: , Rfl:    atorvastatin (LIPITOR) 80 MG tablet, Take 1 tablet (80 mg total) by mouth daily. (Patient taking differently: Take 80 mg by mouth every evening.), Disp: 90 tablet, Rfl: 3    baclofen (LIORESAL) 20 MG tablet, Take 20 mg by mouth 2 (two) times daily., Disp: , Rfl:    Biotin w/ Vitamins C & E (HAIR/SKIN/NAILS PO), Take 3 tablets by mouth daily., Disp: , Rfl:    Blood Glucose Monitoring Suppl (BLOOD GLUCOSE MONITOR SYSTEM) w/Device KIT, use as directed, Disp: 1 kit, Rfl: 0   Cholecalciferol (VITAMIN D-3) 125 MCG (5000 UT) TABS, Take 5,000 Units by mouth in the morning., Disp: , Rfl:    dicyclomine (BENTYL) 20 MG tablet, Take 20 mg by mouth 2 (two) times daily., Disp: , Rfl:    doxepin (SINEQUAN) 10 MG capsule, Take 10 mg by mouth at bedtime., Disp: , Rfl:    doxycycline (VIBRA-TABS) 100 MG tablet, Take 1 tablet (100 mg total) by mouth 2 (two) times daily., Disp: 60 tablet, Rfl: 0   DULoxetine (CYMBALTA) 30 MG capsule, 30 mg 2 (two) times daily., Disp: , Rfl:    Eszopiclone (LUNESTA PO), Take 1 tablet by mouth at bedtime., Disp: , Rfl:    famotidine (PEPCID) 20 MG tablet, Take 20 mg by mouth 2 (two) times daily., Disp: , Rfl:    fluticasone (FLONASE) 50 MCG/ACT nasal spray, Place 2 sprays into the nose daily. (Patient taking differently:  Place 2 sprays into the nose in the morning and at bedtime.), Disp: 16 g, Rfl: 11   glucose blood (ACCU-CHEK GUIDE) test strip, Use as instructed up to 4 times daily, Disp: 100 each, Rfl: 12   Homeopathic Products (LEG CRAMPS PO), Place 3 tablets under the tongue 2 (two) times daily. 3 am tablets and 3 pm tablets., Disp: , Rfl:    hydrALAZINE (APRESOLINE) 50 MG tablet, Take 1 tablet (50 mg total) by mouth every 8 (eight) hours., Disp: , Rfl:    ibuprofen (ADVIL) 200 MG tablet, Take 400 mg by mouth every 6 (six) hours as needed., Disp: , Rfl:    insulin glargine (LANTUS) 100 UNIT/ML Solostar Pen, Inject 40 Units into the skin daily. (Patient taking differently: Inject 40 Units into the skin every morning.), Disp: 15 mL, Rfl: 2   insulin lispro (HUMALOG) 100 UNIT/ML KwikPen, Inject 10 Units into the skin 3 (three) times daily with meals.  (Patient taking differently: Inject 3-6 Units into the skin 3 (three) times daily with meals. Sliding scale insulin), Disp: 15 mL, Rfl: 2   Insulin Pen Needle 32G X 4 MM MISC, use as directed 4 times daily, Disp: 200 each, Rfl: 2   lamoTRIgine (LAMICTAL) 200 MG tablet, TAKE 1 TABLET BY MOUTH TWICE DAILY (Patient taking differently: Take 200 mg by mouth 2 (two) times daily.), Disp: 60 tablet, Rfl: 11   lansoprazole (PREVACID) 30 MG capsule, Take 30 mg by mouth 2 (two) times daily before a meal., Disp: , Rfl:    loperamide (IMODIUM) 2 MG capsule, Take 1 capsule (2 mg total) by mouth 2 (two) times daily., Disp: 30 capsule, Rfl: 0   losartan (COZAAR) 100 MG tablet, Take 1 tablet (100 mg total) by mouth daily. (Patient taking differently: Take 100 mg by mouth at bedtime.), Disp: 90 tablet, Rfl: 3   MAGNESIUM-OXIDE 400 (240 Mg) MG tablet, Take 1 tablet by mouth daily., Disp: , Rfl:    MELATONIN MAXIMUM STRENGTH 5 MG TABS, Take 5 mg by mouth at bedtime., Disp: , Rfl:    montelukast (SINGULAIR) 10 MG tablet, Take 10 mg by mouth at bedtime., Disp: , Rfl:    NARCAN 4 MG/0.1ML LIQD nasal spray kit, Place 1 spray into the nose once as needed (overdose)., Disp: , Rfl:    Oxcarbazepine (TRILEPTAL) 300 MG tablet, TAKE 1 TABLET(300 MG) BY MOUTH TWICE DAILY, Disp: 60 tablet, Rfl: 5   oxyCODONE (ROXICODONE) 15 MG immediate release tablet, Take 15 mg by mouth in the morning, at noon, in the evening, and at bedtime. May take up to a total of 6 times per day if needed, Disp: , Rfl:    OZEMPIC, 0.25 OR 0.5 MG/DOSE, 2 MG/1.5ML SOPN, Inject into the skin once a week. Tuesdays, Disp: , Rfl:    pantoprazole (PROTONIX) 40 MG tablet, 40 mg 2 (two) times daily., Disp: , Rfl:    sulfamethoxazole-trimethoprim (BACTRIM DS) 800-160 MG tablet, Take 1 tablet by mouth 2 (two) times daily., Disp: 60 tablet, Rfl: 0   sulfamethoxazole-trimethoprim (BACTRIM DS) 800-160 MG tablet, Take 1 tablet by mouth 2 (two) times daily., Disp: 28 tablet,  Rfl: 0   tiZANidine (ZANAFLEX) 2 MG tablet, Take 2 mg by mouth as needed., Disp: , Rfl:    triamterene-hydrochlorothiazide (MAXZIDE-25) 37.5-25 MG tablet, Take 1 tablet by mouth every morning., Disp: , Rfl:    VIBERZI 100 MG TABS, Take 100 mg by mouth 2 (two) times daily., Disp: , Rfl:    vitamin B-12 (  CYANOCOBALAMIN) 1000 MCG tablet, Take 1,000 mcg by mouth in the morning., Disp: , Rfl:    XIIDRA 5 % SOLN, Place 2 drops into both eyes in the morning and at bedtime., Disp: , Rfl:   Social History   Tobacco Use  Smoking Status Former   Packs/day: 0.25   Years: 12.00   Total pack years: 3.00   Types: Cigarettes   Quit date: 03/2021   Years since quitting: 0.9  Smokeless Tobacco Never  Tobacco Comments   06/13/20 1 pk per 3 days    No Known Allergies Objective:  There were no vitals filed for this visit. There is no height or weight on file to calculate BMI. Constitutional Well developed. Well nourished.  Vascular Foot warm and well perfused. Capillary refill normal to all digits.   Neurologic Normal speech. Oriented to person, place, and time. Epicritic sensation to light touch grossly present bilaterally.  Dermatologic Wound dehiscence is noted as measured below.  There is no further exposure of hardware noted good granulation tissue noted and adequate coverage noted.  Orthopedic: Very mild tenderness to palpation noted about the surgical site.   Radiographs: 3 views of skeletally mature adult left foot: Hardware is intact no signs of backing out noted from the proximal screw we will continue to keep an eye out on it.  Good correction reduction of fracture noted.  No bony abnormalities identified.      Assessment:   1. Wound dehiscence   2. Status post left foot surgery     Plan:  Patient was evaluated and treated and all questions answered.  S/p foot surgery left -Progressing as expected post-operatively. -XR: See above -WB Status: She can begin weightbearing as  tolerated with surgical shoe.  However she already is weightbearing as tolerated -Sutures: This is noted measuring to be 3 cm x 1 m. -Medications: None -ABIs PVRs were reviewed which show some noncompressible arteries however the waveforms are normal.  Clinically the wound is improving considerably.  We will continue to monitor and if there is no improvement or stagnation we discussed vascular consultation -Cultures were taken with no growth so far. -Wound VAC was reapplied.  More granulation tissue is noted -Attempted to find home health nurse however no one is available -I discussed with her that she is a high risk of losing the leg if this worsens. -At this time there is very good granulation tissue covering the hardware and the wound is decreasing considerably.  Continue wound VAC -Her last A1c was 5.5. -All labs within normal limits -Continue antibiotics  No follow-ups on file.   Wound decreasing.  Surgical shoe.  Ambulate as tolerated

## 2022-03-05 ENCOUNTER — Telehealth: Payer: Self-pay | Admitting: *Deleted

## 2022-03-05 NOTE — Telephone Encounter (Signed)
Patient is asking that last office visit notes/ pictures from Sept and Oct. be sent toe 12M to continue her wound vac services. Faxed notes/pictures from Sept. (Patient has not been seen in Oct as of yet) to number given by patient,confirmation received 03/05/22. Patient aware.

## 2022-03-13 ENCOUNTER — Ambulatory Visit (INDEPENDENT_AMBULATORY_CARE_PROVIDER_SITE_OTHER): Payer: Medicare Other | Admitting: Podiatry

## 2022-03-13 ENCOUNTER — Encounter: Payer: Self-pay | Admitting: Podiatry

## 2022-03-13 ENCOUNTER — Ambulatory Visit (INDEPENDENT_AMBULATORY_CARE_PROVIDER_SITE_OTHER): Payer: Medicare Other

## 2022-03-13 DIAGNOSIS — T8130XA Disruption of wound, unspecified, initial encounter: Secondary | ICD-10-CM | POA: Diagnosis not present

## 2022-03-13 MED ORDER — SANTYL 250 UNIT/GM EX OINT: 1.0000 | TOPICAL_OINTMENT | Freq: Every day | CUTANEOUS | 0 refills | Status: DC

## 2022-03-13 NOTE — Progress Notes (Addendum)
Subjective:  Patient ID: [name], female    DOB: [birth year],  MRN: [id number]  Chief Complaint  Patient presents with   Wound Check    DOS: 11/25/2021 Procedure: ORIF of left ankle fracture  61 y.o. female returns for post-op check.  Patient states she is doing well.  The wound is improving.  She is already weightbearing as tolerated in regular shoes.  Review of Systems: Negative except as noted in the HPI. Denies N/V/F/Ch.  Past Medical History:  Diagnosis Date   Acute cystitis    Acute metabolic encephalopathy    Anemia    Arthritis    Chronic pain    Diabetes mellitus    Fibromyalgia    GERD (gastroesophageal reflux disease)    Hepatitis C    received treatment   History of methicillin resistant staphylococcus aureus (MRSA)    Hypercholesteremia    Hypertension    IBS (irritable bowel syndrome)    Multiple sclerosis exacerbation (HCC)    Neurogenic bladder    pt denies having to cath herself-says she voids on her own as of 11-21-21   Neuropathy    Obesity    Sacral nerve stimulator present    battery is dead and has not worked in years   Stroke [facility])    pt denies this but pcp has it in their history   Tachycardia    UTI (urinary tract infection)    Vitamin D deficiency     Current Outpatient Medications:    collagenase (SANTYL) 250 UNIT/GM ointment, Apply 1 Application topically daily., Disp: 15 g, Rfl: 0   Accu-Chek Softclix Lancets lancets, Use as directed up to 4 times daily, Disp: 100 each, Rfl: 5   amantadine (SYMMETREL) 100 MG capsule, Take 1 capsule (100 mg total) by mouth 2 (two) times daily., Disp: 60 capsule, Rfl: 11   amitriptyline (ELAVIL) 25 MG tablet, TAKE 1 TABLET(25 MG) BY MOUTH AT BEDTIME, Disp: 30 tablet, Rfl: 5   amLODipine (NORVASC) 10 MG tablet, Take 1 tablet (10 mg total) by mouth daily. (Patient taking differently: Take 10 mg by mouth at bedtime.), Disp: , Rfl:    atorvastatin (LIPITOR) 80 MG tablet, Take 1 tablet (80 mg total) by mouth  daily. (Patient taking differently: Take 80 mg by mouth every evening.), Disp: 90 tablet, Rfl: 3   baclofen (LIORESAL) 20 MG tablet, Take 20 mg by mouth 2 (two) times daily., Disp: , Rfl:    Biotin w/ Vitamins C & E (HAIR/SKIN/NAILS PO), Take 3 tablets by mouth daily., Disp: , Rfl:    Blood Glucose Monitoring Suppl (BLOOD GLUCOSE MONITOR SYSTEM) w/Device KIT, use as directed, Disp: 1 kit, Rfl: 0   Cholecalciferol (VITAMIN D-3) 125 MCG (5000 UT) TABS, Take 5,000 Units by mouth in the morning., Disp: , Rfl:    dicyclomine (BENTYL) 20 MG tablet, Take 20 mg by mouth 2 (two) times daily., Disp: , Rfl:    diphenoxylate-atropine (LOMOTIL) 2.5-0.025 MG tablet, Take 3 tablets by mouth 2 (two) times daily., Disp: , Rfl:    doxepin (SINEQUAN) 10 MG capsule, Take 10 mg by mouth at bedtime., Disp: , Rfl:    doxycycline (VIBRA-TABS) 100 MG tablet, Take 1 tablet (100 mg total) by mouth 2 (two) times daily., Disp: 60 tablet, Rfl: 0   DULoxetine (CYMBALTA) 30 MG capsule, 30 mg 2 (two) times daily., Disp: , Rfl:    DULoxetine (CYMBALTA) 60 MG capsule, Take 60 mg by mouth 2 (two) times daily., Disp: , Rfl:  Eszopiclone (LUNESTA PO), Take 1 tablet by mouth at bedtime., Disp: , Rfl:    famotidine (PEPCID) 20 MG tablet, Take 20 mg by mouth 2 (two) times daily., Disp: , Rfl:    fluticasone (FLONASE) 50 MCG/ACT nasal spray, Place 2 sprays into the nose daily. (Patient taking differently: Place 2 sprays into the nose in the morning and at bedtime.), Disp: 16 g, Rfl: 11   gabapentin (NEURONTIN) 100 MG capsule, Take 100 mg by mouth 3 (three) times daily as needed., Disp: , Rfl:    glucose blood (ACCU-CHEK GUIDE) test strip, Use as instructed up to 4 times daily, Disp: 100 each, Rfl: 12   Homeopathic Products (LEG CRAMPS PO), Place 3 tablets under the tongue 2 (two) times daily. 3 am tablets and 3 pm tablets., Disp: , Rfl:    hydrALAZINE (APRESOLINE) 50 MG tablet, Take 1 tablet (50 mg total) by mouth every 8 (eight) hours.,  Disp: , Rfl:    ibuprofen (ADVIL) 200 MG tablet, Take 400 mg by mouth every 6 (six) hours as needed., Disp: , Rfl:    ibuprofen (ADVIL) 800 MG tablet, Take 800 mg by mouth every 8 (eight) hours as needed., Disp: , Rfl:    insulin glargine (LANTUS) 100 UNIT/ML Solostar Pen, Inject 40 Units into the skin daily. (Patient taking differently: Inject 40 Units into the skin every morning.), Disp: 15 mL, Rfl: 2   insulin lispro (HUMALOG) 100 UNIT/ML KwikPen, Inject 10 Units into the skin 3 (three) times daily with meals. (Patient taking differently: Inject 3-6 Units into the skin 3 (three) times daily with meals. Sliding scale insulin), Disp: 15 mL, Rfl: 2   Insulin Pen Needle 32G X 4 MM MISC, use as directed 4 times daily, Disp: 200 each, Rfl: 2   lamoTRIgine (LAMICTAL) 200 MG tablet, TAKE 1 TABLET BY MOUTH TWICE DAILY (Patient taking differently: Take 200 mg by mouth 2 (two) times daily.), Disp: 60 tablet, Rfl: 11   lansoprazole (PREVACID) 30 MG capsule, Take 30 mg by mouth 2 (two) times daily before a meal., Disp: , Rfl:    loperamide (IMODIUM) 2 MG capsule, Take 1 capsule (2 mg total) by mouth 2 (two) times daily., Disp: 30 capsule, Rfl: 0   losartan (COZAAR) 100 MG tablet, Take 1 tablet (100 mg total) by mouth daily. (Patient taking differently: Take 100 mg by mouth at bedtime.), Disp: 90 tablet, Rfl: 3   losartan (COZAAR) 50 MG tablet, Take 100 mg by mouth daily., Disp: , Rfl:    MAGNESIUM-OXIDE 400 (240 Mg) MG tablet, Take 1 tablet by mouth daily., Disp: , Rfl:    MELATONIN MAXIMUM STRENGTH 5 MG TABS, Take 5 mg by mouth at bedtime., Disp: , Rfl:    montelukast (SINGULAIR) 10 MG tablet, Take 10 mg by mouth at bedtime., Disp: , Rfl:    MOUNJARO 2.5 MG/0.5ML Pen, SMARTSIG:2.5 Milligram(s) SUB-Q Once a Week, Disp: , Rfl:    NARCAN 4 MG/0.1ML LIQD nasal spray kit, Place 1 spray into the nose once as needed (overdose)., Disp: , Rfl:    Oxcarbazepine (TRILEPTAL) 300 MG tablet, TAKE 1 TABLET(300 MG) BY MOUTH  TWICE DAILY, Disp: 60 tablet, Rfl: 5   oxyCODONE (ROXICODONE) 15 MG immediate release tablet, Take 15 mg by mouth in the morning, at noon, in the evening, and at bedtime. May take up to a total of 6 times per day if needed, Disp: , Rfl:    OZEMPIC, 0.25 OR 0.5 MG/DOSE, 2 MG/1.5ML SOPN, Inject into the  skin once a week. Tuesdays, Disp: , Rfl:    pantoprazole (PROTONIX) 40 MG tablet, 40 mg 2 (two) times daily., Disp: , Rfl:    sulfamethoxazole-trimethoprim (BACTRIM DS) 800-160 MG tablet, Take 1 tablet by mouth 2 (two) times daily., Disp: 60 tablet, Rfl: 0   sulfamethoxazole-trimethoprim (BACTRIM DS) 800-160 MG tablet, Take 1 tablet by mouth 2 (two) times daily., Disp: 28 tablet, Rfl: 0   tiZANidine (ZANAFLEX) 2 MG tablet, Take 2 mg by mouth as needed., Disp: , Rfl:    triamterene-hydrochlorothiazide (MAXZIDE-25) 37.5-25 MG tablet, Take 1 tablet by mouth every morning., Disp: , Rfl:    VIBERZI 100 MG TABS, Take 100 mg by mouth 2 (two) times daily., Disp: , Rfl:    vitamin B-12 (CYANOCOBALAMIN) 1000 MCG tablet, Take 1,000 mcg by mouth in the morning., Disp: , Rfl:    XIIDRA 5 % SOLN, Place 2 drops into both eyes in the morning and at bedtime., Disp: , Rfl:   Social History   Tobacco Use  Smoking Status Former   Packs/day: 0.25   Years: 12.00   Total pack years: 3.00   Types: Cigarettes   Quit date: 03/2021   Years since quitting: 0.9  Smokeless Tobacco Never  Tobacco Comments   06/13/20 1 pk per 3 days    No Known Allergies Objective:  There were no vitals filed for this visit. There is no height or weight on file to calculate BMI. Constitutional Well developed. Well nourished.  Vascular Foot warm and well perfused. Capillary refill normal to all digits.   Neurologic Normal speech. Oriented to person, place, and time. Epicritic sensation to light touch grossly present bilaterally.  Dermatologic Wound dehiscence is noted as measured below.  Improving granulation tissue noted.   Adequate coverage noted.  Orthopedic: Very mild tenderness to palpation noted about the surgical site.   Radiographs: 3 views of skeletally mature adult left foot: Hardware is intact no signs of backing out noted from the proximal screw we will continue to keep an eye out on it.  Good correction reduction of fracture noted.  No bony abnormalities identified.  Consolidation noted callus noted.       Assessment:   1. Wound dehiscence     Plan:  Patient was evaluated and treated and all questions answered.  S/p foot surgery left -Progressing as expected post-operatively. -XR: See above -WB Status: Weightbearing as tolerated in regular shoes. -Sutures: This is noted measuring to be 2.5 cm x 1 m. -Medications: None -We will do wound VAC holiday for now.  Patient will do Santyl wet-to-dry dressing. -ABIs PVRs were reviewed which show some noncompressible arteries however the waveforms are normal.  Clinically the wound is improving considerably.  We will continue to monitor and if there is no improvement or stagnation we discussed vascular consultation -Cultures were taken with no growth so far. -Attempted to find home health nurse however no one is available -I discussed with her that she is a high risk of losing the leg if this worsens. -Her last A1c was 5.5. -All labs within normal limits -Continue antibiotics  No follow-ups on file.   Wound decreasing.  Surgical shoe.  Ambulate as tolerated

## 2022-03-16 ENCOUNTER — Telehealth: Payer: Self-pay

## 2022-03-17 NOTE — Telephone Encounter (Signed)
Office notes were faxed over today 03/17/22 to [facility] from visit on 03/13/22.

## 2022-03-23 ENCOUNTER — Telehealth: Payer: Self-pay | Admitting: *Deleted

## 2022-03-23 NOTE — Telephone Encounter (Signed)
Patient spoke with 52M today, is no longer needing the vac, is sending back on Wednesday.

## 2022-03-25 ENCOUNTER — Ambulatory Visit: Payer: Medicare Other | Admitting: Family Medicine

## 2022-03-25 ENCOUNTER — Ambulatory Visit (INDEPENDENT_AMBULATORY_CARE_PROVIDER_SITE_OTHER): Payer: Medicare Other | Admitting: Adult Health

## 2022-03-25 ENCOUNTER — Encounter: Payer: Self-pay | Admitting: Adult Health

## 2022-03-25 VITALS — BP 169/108 | HR 100 | Ht 63.0 in | Wt 257.4 lb

## 2022-03-25 DIAGNOSIS — R413 Other amnesia: Secondary | ICD-10-CM | POA: Diagnosis not present

## 2022-03-25 DIAGNOSIS — Z79899 Other long term (current) drug therapy: Secondary | ICD-10-CM

## 2022-03-25 DIAGNOSIS — E538 Deficiency of other specified B group vitamins: Secondary | ICD-10-CM | POA: Diagnosis not present

## 2022-03-25 DIAGNOSIS — G35 Multiple sclerosis: Secondary | ICD-10-CM

## 2022-03-25 DIAGNOSIS — R519 Headache, unspecified: Secondary | ICD-10-CM

## 2022-03-25 DIAGNOSIS — R251 Tremor, unspecified: Secondary | ICD-10-CM

## 2022-03-25 NOTE — Patient Instructions (Addendum)
Will follow up with Dr. Epimenio Foot regarding more recent onset headaches, memory and attention span concerns   Will discuss other treatment options for continued tremors with Dr. Epimenio Foot  I will keep you updated regarding his recommendations via MyChart  Continue Ocrevus infusion  We will check lab work today     Follow up in 6 months with Dr. Epimenio Foot or call earlier if needed

## 2022-03-25 NOTE — Progress Notes (Addendum)
[facility]  PATIENT: [name] DOB: 12/27/1960  REFERRING DOCTOR OR PCP: Lucky Cowboy, FNP SOURCE: Patient, notes from primary care, imaging and lab reports, MRI images personally reviewed.  _________________________________   HISTORICAL  CHIEF COMPLAINT:  Chief Complaint  Patient presents with   follow up Multiple Sclerosis    Pt reports feeling not too good for the past couple days. She reports lately she has been waking up with headaches and stiffness in neck. She is also asking to take a memory test. Room 2 alone    HISTORY OF PRESENT ILLNESS:  [name] is a 61 y.o. woman with multiple sclerosis, LBP and gait disturbance.  She was previously seen by Dr. Felecia Shelling 09/16/2021   Update 03/25/2022 JM:  She is currently on Ocrevus, tolerated initial infusion well 12/30/2021. Next infusion scheduled in February  Initially planned on transitioning from Tecfidera to Tysabri due to exacerbation while on Tecfidera (though no definitive change in MRI - no enhancing lesions to explain symptoms) but due to JCV ab being positive, ended up transitioning to [place].  She does complain of waking up with posterior headaches and neck stiffness over the past 1-2 months, headaches usually improve throughout the day, neck pain has been persistent.  C/o short term memory loss issues and short attention span over the past 1-2 months, denies prior issues with memory. Denies any family history of dementia. MMSE today 25/30.  Continued hand tremors, initially improved on lamotrigine but started to come back about 2 months ago  can fluctuate in severity and at times may not have any tremor, unable to identify specific triggers or alleviating factors  Suffered a closed fracture of distal end of left fibula post fall on 11/03/2021, she was seen at [facility] clinic and referred to podiatry.  She eventually underwent ORIF on 7/11. Unfortunately, incision area became infected requiring course  of antibiotics and wound VAC, she is now off wound VAC and has been using cream which she believes has been working well.  She is currently weightbearing as tolerated but does admit to being less active since his fracture.  Use of cane at all times, denies any recent falls.   She uses her cane and can go 100 feet on a good day but less on many days.   She goes further with a walker.   The left leg buckles and she falls.   She is having some difficulty doing activities of daily living.   We tried  dalfampridine but it did not help.    We also tried amantadine but she noted no benefit for gait.   She has a burning dysesthetic pain in her arms and legs.   She is on lamotrigine as well for pain.   Gabapentin had not helped and she gained weight on it .  Her vision is doing about the same.  She has urinary urgency and frequency.  Mood is much worse and she is more anxious and depressed,    She is on Cymbalta 60 mg po qd and lamotrigine 200 mg po bid.     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  She is on eszopiclone   She sees [facility] for [facility] Management and other controlled substances.     She also has LBP and sees Dr. Christoper Fabian for pain management and is on  oxycodone 15 mg q6  She had MBBB/RFA helped the right side but not the left side.  Surgery has  been discussed with her.   She is also on tizanidine, baclofen and amitriptyline to help her pain.   She sees Dr. Maia Petties.    Pain is all over.     MS HISTORY She was diagnosed with MS in 2008 after presenting with leg weakness and headache, neck pain, back pain.  MRIs showed foci in her cervical spine and brain.   She was started on Rebif.   Initially she did well but she reports having a lot of relapses.   She was switched to Copaxone but also had more symptoms.   She was then switched to Tysabri for several years but reports having several relapses.   She switched to Tecfidera 5 years ago.   She reports having a flare  in 2015 and had 5 days of IV Solu-medrol.   July 2021, she was complaining of blurry vision, stiff neck and increased burning and tingling a few weeks ago.  She went to the ED and had MRIs performed showing no new lesions.  She was switched to [place] in August 2023.   IMAGING: MRI of the cervical spine 11/18/2019 and 03/02/2012 showed two T2 hyperintense foci adjacent to C2 and C3-C4.  There was mild multilevel degenerative changes as well.    MRI of the brain 11/18/2019 and 03/02/2020 show a fairly low plaque burden of T2/FLAIR hyperintense foci.  Most of them are nonspecific.  A few are periventricular.  None acute and no change over time.  MRI of the cervical spine 02/18/2021 showed no new lesions an stable DJD with C5C6 and C6C7 mild spinal senosis  MRI of the brain 02/18/2021 showed no change compared to 11/18/2019  REVIEW OF SYSTEMS: Constitutional: No fevers, chills, sweats, or change in appetite.  She has fatigue and she reports insomnia Eyes: No visual changes, double vision, eye pain Ear, nose and throat: No hearing loss, ear pain, nasal congestion, sore throat Cardiovascular: No chest pain, palpitations Respiratory:  No shortness of breath at rest or with exertion.   No wheezes GastrointestinaI: No nausea, vomiting, diarrhea, abdominal pain, fecal incontinence Genitourinary:  No dysuria, urinary retention or frequency.  No nocturia. Musculoskeletal: Pain all over in muscles, joints and spine Integumentary: No rash, pruritus, skin lesions Neurological: as above Psychiatric: She has depression and anxiety Endocrine: No palpitations, diaphoresis, change in appetite, change in weigh or increased thirst Hematologic/Lymphatic:  No anemia, purpura, petechiae. Allergic/Immunologic: No itchy/runny eyes, nasal congestion, recent allergic reactions, rashes  ALLERGIES: No Known Allergies  HOME MEDICATIONS:  Current Outpatient Medications:    Accu-Chek Softclix Lancets lancets, Use as  directed up to 4 times daily, Disp: 100 each, Rfl: 5   amantadine (SYMMETREL) 100 MG capsule, Take 1 capsule (100 mg total) by mouth 2 (two) times daily., Disp: 60 capsule, Rfl: 11   amitriptyline (ELAVIL) 25 MG tablet, TAKE 1 TABLET(25 MG) BY MOUTH AT BEDTIME, Disp: 30 tablet, Rfl: 5   amLODipine (NORVASC) 10 MG tablet, Take 1 tablet (10 mg total) by mouth daily. (Patient taking differently: Take 10 mg by mouth at bedtime.), Disp: , Rfl:    atorvastatin (LIPITOR) 80 MG tablet, Take 1 tablet (80 mg total) by mouth daily. (Patient taking differently: Take 80 mg by mouth every evening.), Disp: 90 tablet, Rfl: 3   baclofen (LIORESAL) 20 MG tablet, Take 20 mg by mouth 2 (two) times daily., Disp: , Rfl:    Biotin w/ Vitamins C & E (HAIR/SKIN/NAILS PO), Take 3 tablets by mouth daily., Disp: , Rfl:  Cholecalciferol (VITAMIN D-3) 125 MCG (5000 UT) TABS, Take 5,000 Units by mouth in the morning., Disp: , Rfl:    collagenase (SANTYL) 250 UNIT/GM ointment, Apply 1 Application topically daily., Disp: 15 g, Rfl: 0   dicyclomine (BENTYL) 20 MG tablet, Take 20 mg by mouth 2 (two) times daily., Disp: , Rfl:    diphenoxylate-atropine (LOMOTIL) 2.5-0.025 MG tablet, Take 3 tablets by mouth 2 (two) times daily., Disp: , Rfl:    doxycycline (VIBRA-TABS) 100 MG tablet, Take 1 tablet (100 mg total) by mouth 2 (two) times daily., Disp: 60 tablet, Rfl: 0   DULoxetine (CYMBALTA) 60 MG capsule, Take 60 mg by mouth 2 (two) times daily., Disp: , Rfl:    Eszopiclone (LUNESTA PO), Take 1 tablet by mouth at bedtime., Disp: , Rfl:    famotidine (PEPCID) 20 MG tablet, Take 20 mg by mouth 2 (two) times daily., Disp: , Rfl:    fluticasone (FLONASE) 50 MCG/ACT nasal spray, Place 2 sprays into the nose daily. (Patient taking differently: Place 2 sprays into the nose in the morning and at bedtime.), Disp: 16 g, Rfl: 11   gabapentin (NEURONTIN) 100 MG capsule, Take 100 mg by mouth 3 (three) times daily as needed., Disp: , Rfl:     Homeopathic Products (LEG CRAMPS PO), Place 3 tablets under the tongue 2 (two) times daily. 3 am tablets and 3 pm tablets., Disp: , Rfl:    hydrALAZINE (APRESOLINE) 50 MG tablet, Take 1 tablet (50 mg total) by mouth every 8 (eight) hours., Disp: , Rfl:    lamoTRIgine (LAMICTAL) 200 MG tablet, TAKE 1 TABLET BY MOUTH TWICE DAILY (Patient taking differently: Take 200 mg by mouth 2 (two) times daily.), Disp: 60 tablet, Rfl: 11   lansoprazole (PREVACID) 30 MG capsule, Take 30 mg by mouth 2 (two) times daily before a meal., Disp: , Rfl:    loperamide (IMODIUM) 2 MG capsule, Take 1 capsule (2 mg total) by mouth 2 (two) times daily., Disp: 30 capsule, Rfl: 0   losartan (COZAAR) 100 MG tablet, Take 1 tablet (100 mg total) by mouth daily. (Patient taking differently: Take 100 mg by mouth at bedtime.), Disp: 90 tablet, Rfl: 3   MAGNESIUM-OXIDE 400 (240 Mg) MG tablet, Take 1 tablet by mouth daily., Disp: , Rfl:    MELATONIN MAXIMUM STRENGTH 5 MG TABS, Take 5 mg by mouth at bedtime., Disp: , Rfl:    montelukast (SINGULAIR) 10 MG tablet, Take 10 mg by mouth at bedtime., Disp: , Rfl:    MOUNJARO 2.5 MG/0.5ML Pen, SMARTSIG:2.5 Milligram(s) SUB-Q Once a Week, Disp: , Rfl:    NARCAN 4 MG/0.1ML LIQD nasal spray kit, Place 1 spray into the nose once as needed (overdose)., Disp: , Rfl:    Oxcarbazepine (TRILEPTAL) 300 MG tablet, TAKE 1 TABLET(300 MG) BY MOUTH TWICE DAILY, Disp: 60 tablet, Rfl: 5   oxyCODONE (ROXICODONE) 15 MG immediate release tablet, Take 15 mg by mouth in the morning, at noon, in the evening, and at bedtime. May take up to a total of 6 times per day if needed, Disp: , Rfl:    pantoprazole (PROTONIX) 40 MG tablet, 40 mg 2 (two) times daily., Disp: , Rfl:    sulfamethoxazole-trimethoprim (BACTRIM DS) 800-160 MG tablet, Take 1 tablet by mouth 2 (two) times daily., Disp: 60 tablet, Rfl: 0   sulfamethoxazole-trimethoprim (BACTRIM DS) 800-160 MG tablet, Take 1 tablet by mouth 2 (two) times daily., Disp: 28  tablet, Rfl: 0   tiZANidine (ZANAFLEX) 2 MG tablet,  Take 2 mg by mouth as needed., Disp: , Rfl:    triamterene-hydrochlorothiazide (MAXZIDE-25) 37.5-25 MG tablet, Take 1 tablet by mouth every morning., Disp: , Rfl:    VIBERZI 100 MG TABS, Take 100 mg by mouth 2 (two) times daily., Disp: , Rfl:    vitamin B-12 (CYANOCOBALAMIN) 1000 MCG tablet, Take 1,000 mcg by mouth in the morning., Disp: , Rfl:    XIIDRA 5 % SOLN, Place 2 drops into both eyes in the morning and at bedtime., Disp: , Rfl:    Blood Glucose Monitoring Suppl (BLOOD GLUCOSE MONITOR SYSTEM) w/Device KIT, use as directed (Patient not taking: Reported on 03/25/2022), Disp: 1 kit, Rfl: 0   doxepin (SINEQUAN) 10 MG capsule, Take 10 mg by mouth at bedtime. (Patient not taking: Reported on 03/25/2022), Disp: , Rfl:    DULoxetine (CYMBALTA) 30 MG capsule, 30 mg 2 (two) times daily. (Patient not taking: Reported on 03/25/2022), Disp: , Rfl:    glucose blood (ACCU-CHEK GUIDE) test strip, Use as instructed up to 4 times daily (Patient not taking: Reported on 03/25/2022), Disp: 100 each, Rfl: 12   ibuprofen (ADVIL) 200 MG tablet, Take 400 mg by mouth every 6 (six) hours as needed. (Patient not taking: Reported on 03/25/2022), Disp: , Rfl:    ibuprofen (ADVIL) 800 MG tablet, Take 800 mg by mouth every 8 (eight) hours as needed. (Patient not taking: Reported on 03/25/2022), Disp: , Rfl:    insulin glargine (LANTUS) 100 UNIT/ML Solostar Pen, Inject 40 Units into the skin daily. (Patient not taking: Reported on 03/25/2022), Disp: 15 mL, Rfl: 2   insulin lispro (HUMALOG) 100 UNIT/ML KwikPen, Inject 10 Units into the skin 3 (three) times daily with meals. (Patient not taking: Reported on 03/25/2022), Disp: 15 mL, Rfl: 2   Insulin Pen Needle 32G X 4 MM MISC, use as directed 4 times daily (Patient not taking: Reported on 03/25/2022), Disp: 200 each, Rfl: 2   losartan (COZAAR) 50 MG tablet, Take 100 mg by mouth daily. (Patient not taking: Reported on 03/25/2022), Disp:  , Rfl:    OZEMPIC, 0.25 OR 0.5 MG/DOSE, 2 MG/1.5ML SOPN, Inject into the skin once a week. Tuesdays (Patient not taking: Reported on 03/25/2022), Disp: , Rfl:   PAST MEDICAL HISTORY: Past Medical History:  Diagnosis Date   Acute cystitis    Acute metabolic encephalopathy    Anemia    Arthritis    Chronic pain    Diabetes mellitus    Fibromyalgia    GERD (gastroesophageal reflux disease)    Hepatitis C    received treatment   History of methicillin resistant staphylococcus aureus (MRSA)    Hypercholesteremia    Hypertension    IBS (irritable bowel syndrome)    Multiple sclerosis exacerbation (HCC)    Neurogenic bladder    pt denies having to cath herself-says she voids on her own as of 11-21-21   Neuropathy    Obesity    Sacral nerve stimulator present    battery is dead and has not worked in years   Stroke [facility])    pt denies this but pcp has it in their history   Tachycardia    UTI (urinary tract infection)    Vitamin D deficiency     PAST SURGICAL HISTORY: Past Surgical History:  Procedure Laterality Date   ABDOMINAL HYSTERECTOMY     partial   I & D EXTREMITY Right 12/14/2020   Procedure: IRRIGATION AND DEBRIDEMENT RIGHT LOWER EXTREMITY;  Surgeon: Rod Can, MD;  Location: [place];  Service: Orthopedics;  Laterality: Right;   JOINT REPLACEMENT     LTK   ORIF ANKLE FRACTURE Left 11/25/2021   Procedure: OPEN REDUCTION INTERNAL FIXATION (ORIF) LEFT ANKLE FRACTURE;  Surgeon: Felipa Furnace, DPM;  Location: [facility];  Service: Orthopedics;  Laterality: Left;  POPLITEAL BLOCK   RADIOLOGY WITH ANESTHESIA N/A 02/18/2021   Procedure: MRI WITH ANESTHESIA CERVICAL SPINE WITH AND WITHOUT CONTRAST AND BRAIN WITH AND WITHOUT CONTRAST;  Surgeon: Radiologist, Medication, MD;  Location: [place];  Service: Radiology;  Laterality: N/A;   RADIOLOGY WITH ANESTHESIA N/A 07/07/2021   Procedure: MRI WITH ANESTHESIA;  Surgeon: Radiologist, Medication, MD;  Location: [place];  Service: Radiology;   Laterality: N/A;   SACRAL NERVE STIMULATOR PLACEMENT     [employer]   SYNDESMOSIS REPAIR Left 11/25/2021   Procedure: SYNDESMOSIS REPAIR;  Surgeon: Felipa Furnace, DPM;  Location: [facility];  Service: Orthopedics;  Laterality: Left;   TUBAL LIGATION      FAMILY HISTORY: Family History  Problem Relation Age of Onset   Diabetes Mother    Hypertension Mother    Stroke Father     SOCIAL HISTORY:  Social History   Socioeconomic History   Marital status: Single    Spouse name: Not on file   Number of children: 2   Years of education: Not on file   Highest education level: Not on file  Occupational History   Not on file  Tobacco Use   Smoking status: Former    Packs/day: 0.25    Years: 12.00    Total pack years: 3.00    Types: Cigarettes    Quit date: 03/2021    Years since quitting: 1.0   Smokeless tobacco: Never   Tobacco comments:    06/13/20 1 pk per 3 days  Substance and Sexual Activity   Alcohol use: No   Drug use: No   Sexual activity: Not on file  Other Topics Concern   Not on file  Social History Narrative   06/13/20 lives alone, dgtr across the street   Social Determinants of Health   Financial Resource Strain: Not on file  Food Insecurity: Not on file  Transportation Needs: Not on file  Physical Activity: Not on file  Stress: Not on file  Social Connections: Not on file  Intimate Partner Violence: Not on file     PHYSICAL EXAM  Vitals:   03/25/22 0942  BP: (!) 169/108  Pulse: 100  Weight: 257 lb 6 oz (116.7 kg)  Height: _0  (1.6 m)   Body mass index is 45.59 kg/m.   General: The patient is well-developed and well-nourished and in no acute distress  HEENT:  Head is [place]/AT.  Sclera are anicteric.     Skin: Extremities are without rash.  There is mild ankle edema Musculoskeletal: She has tenderness in the cervical and lumbar paraspinal muscles.  Neurologic Exam  Mental status: The patient is alert and oriented x 3 at the time of the  examination. The patient has apparent normal recent and remote memory, with an apparently normal attention span and concentration ability.   Speech is normal.  Cranial nerves: Extraocular movements are full.  Facial strength and sensation was normal.  No obvious hearing deficits are noted.  Motor:  Muscle bulk is normal.   Tone is normal. Strength is  5 / 5 in all 4 extremities except 4+/5 ankles,4/5 toes, slightly worse on left. Did have intermittent slight RUE resting tremor, unable  to observe action or postural tremor during visit  Sensory: She reported reduced sensation to touch and temperature in the left arm and leg.  Vibration sensation was more symmetric.  Decreased sensation to vibration in the toes and ankles compared to the knees  Coordination: Cerebellar testing reveals good finger-nose-finger and heel-to-shin bilaterally.  Gait and station: Station is normal.   Gait is arthritic and wide wih reduced stride and use of cane.  She cannot tandem without support..  Romberg is negative.   Reflexes: Deep tendon reflexes are 1 and symmetric in the limbs.        03/25/2022   10:09 AM  MMSE - Mini Mental State Exam  Orientation to time 4  Orientation to Place 5  Registration 3  Attention/ Calculation 2  Recall 3  Language- name 2 objects 2  Language- repeat 1  Language- follow 3 step command 3  Language- read & follow direction 1  Write a sentence 1  Copy design 0  Total score 25        DIAGNOSTIC DATA (LABS, IMAGING, TESTING) - I reviewed patient records, labs, notes, testing and imaging myself where available.  Lab Results  Component Value Date   WBC 7.1 01/02/2022   HGB 12.5 01/02/2022   HCT 35.6 01/02/2022   MCV 86 01/02/2022   PLT 233 01/02/2022      Component Value Date/Time   NA 146 (H) 01/02/2022 1048   K 3.8 01/02/2022 1048   CL 107 (H) 01/02/2022 1048   CO2 23 01/02/2022 1048   GLUCOSE 70 01/02/2022 1048   GLUCOSE 93 08/01/2021 0505   BUN 15  01/02/2022 1048   CREATININE 0.97 01/02/2022 1048   CREATININE 0.85 02/24/2012 1044   CALCIUM 9.6 01/02/2022 1048   PROT 6.8 01/02/2022 1048   ALBUMIN 4.2 01/02/2022 1048   AST 16 01/02/2022 1048   ALT 22 01/02/2022 1048   ALKPHOS 88 01/02/2022 1048   BILITOT 0.2 01/02/2022 1048   GFRNONAA 38 (L) 08/01/2021 0505   GFRAA >60 11/17/2019 1242   Lab Results  Component Value Date   CHOL (H) 02/19/2009    237        ATP III CLASSIFICATION:  <200     mg/dL   Desirable  200-239  mg/dL   Borderline High  >=240    mg/dL   High          HDL 52 02/19/2009   LDLCALC (H) 02/19/2009    135        Total Cholesterol/HDL:CHD Risk Coronary Heart Disease Risk Table                     Men   Women  1/2 Average Risk   3.4   3.3  Average Risk       5.0   4.4  2 X Average Risk   9.6   7.1  3 X Average Risk  23.4   11.0        Use the calculated Patient Ratio above and the CHD Risk Table to determine the patient's CHD Risk.        ATP III CLASSIFICATION (LDL):  <100     mg/dL   Optimal  100-129  mg/dL   Near or Above                    Optimal  130-159  mg/dL   Borderline  160-189  mg/dL   High  >190  mg/dL   Very High   TRIG 249 (H) 02/19/2009   CHOLHDL 4.6 02/19/2009   Lab Results  Component Value Date   HGBA1C 5.5 07/06/2021   Lab Results  Component Value Date   VITAMINB12 4,296 (H) 07/08/2021   Lab Results  Component Value Date   TSH 1.080 10/23/2021       ASSESSMENT AND PLAN  Multiple sclerosis ([place]) - Plan: IgG, IgA, IgM, CMP, CBC with Differential/Platelets  High risk medication use - Plan: IgG, IgA, IgM, CMP, CBC with Differential/Platelets, Vitamin B12  B12 deficiency - Plan: Vitamin B12  Complaints of memory disturbance - Plan: Vitamin B12  Headache in back of head - Plan: Vitamin B12  Tremor - Plan: Vitamin B12   1.    Continue Ocrevus.  Will check lab work today. Prior MRI 06/2021 no new lesions.  2.    Will discuss need of further imaging with Dr.  Felecia Shelling re: 2 month onset of memory complaints, posterior headaches and worsening BUE tremors. Will obtain B12 level, prior thyroid panel WNL 3.    Encouraged weight loss and increasing daily activity.   Advised to eat healthy.  Continue vitamin D. 4.    Use walker instead of cane when more tired.    5.    Return in 6 months or sooner if there are new or worsening neurologic symptoms.  ADDENDUM: per Dr. Felecia Shelling, memory complaints, headaches and tremors  "Is probably not related to the MS.  But if symptoms worsen we we can go ahead and check an MRI of the brain with and without.  She is already on several blood pressure medications so it would be difficult to add a beta-blocker to see if it helps the tremor." Will advise patient.    I spent 36 minutes of face-to-face and non-face-to-face time with patient.  This included previsit chart review, lab review, study review, order entry, electronic health record documentation, patient education and discussion regarding above diagnoses and treatment plan and answered all the questions to patient's satisfaction  Frann Rider, [facility]  [facility] [address] [place],  [address]  Phone [phone] Fax [phone] Note: This document was prepared with digital dictation and possible smart phrase technology. Any transcriptional errors that result from this process are unintentional.

## 2022-03-26 LAB — COMPREHENSIVE METABOLIC PANEL
ALT: 16 IU/L (ref 0–32)
AST: 17 IU/L (ref 0–40)
Albumin/Globulin Ratio: 1.8 (ref 1.2–2.2)
Albumin: 4.8 g/dL (ref 3.9–4.9)
Alkaline Phosphatase: 136 IU/L — ABNORMAL HIGH (ref 44–121)
BUN/Creatinine Ratio: 11 — ABNORMAL LOW (ref 12–28)
BUN: 11 mg/dL (ref 8–27)
Bilirubin Total: 0.3 mg/dL (ref 0.0–1.2)
CO2: 21 mmol/L (ref 20–29)
Calcium: 10.2 mg/dL (ref 8.7–10.3)
Chloride: 105 mmol/L (ref 96–106)
Creatinine, Ser: 1.01 mg/dL — ABNORMAL HIGH (ref 0.57–1.00)
Globulin, Total: 2.7 g/dL (ref 1.5–4.5)
Glucose: 92 mg/dL (ref 70–99)
Potassium: 4.4 mmol/L (ref 3.5–5.2)
Sodium: 143 mmol/L (ref 134–144)
Total Protein: 7.5 g/dL (ref 6.0–8.5)
eGFR: 63 mL/min/{1.73_m2} (ref >59)

## 2022-03-26 LAB — IGG, IGA, IGM
IgA/Immunoglobulin A, Serum: 387 mg/dL — ABNORMAL HIGH (ref 87–352)
IgG (Immunoglobin G), Serum: 943 mg/dL (ref 586–1602)
IgM (Immunoglobulin M), Srm: 151 mg/dL (ref 26–217)

## 2022-03-26 LAB — CBC WITH DIFFERENTIAL/PLATELET
Basophils Absolute: 0 10*3/uL (ref 0.0–0.2)
Basos: 1 %
EOS (ABSOLUTE): 0.1 10*3/uL (ref 0.0–0.4)
Eos: 1 %
Hematocrit: 46.1 % (ref 34.0–46.6)
Hemoglobin: 15.5 g/dL (ref 11.1–15.9)
Immature Grans (Abs): 0 10*3/uL (ref 0.0–0.1)
Immature Granulocytes: 0 %
Lymphocytes Absolute: 1.6 10*3/uL (ref 0.7–3.1)
Lymphs: 31 %
MCH: 28.5 pg (ref 26.6–33.0)
MCHC: 33.6 g/dL (ref 31.5–35.7)
MCV: 85 fL (ref 79–97)
Monocytes Absolute: 0.4 10*3/uL (ref 0.1–0.9)
Monocytes: 8 %
Neutrophils Absolute: 3 10*3/uL (ref 1.4–7.0)
Neutrophils: 59 %
Platelets: 308 10*3/uL (ref 150–450)
RBC: 5.43 x10E6/uL — ABNORMAL HIGH (ref 3.77–5.28)
RDW: 13.6 % (ref 11.7–15.4)
WBC: 5 10*3/uL (ref 3.4–10.8)

## 2022-03-26 LAB — VITAMIN B12: Vitamin B-12: 1987 pg/mL — ABNORMAL HIGH (ref 232–1245)

## 2022-03-30 NOTE — Progress Notes (Signed)
Contacted pt, LVM per DPR, informed her per Dr. Epimenio Foot, memory complaints, headaches and tremors  "Is probably not related to the MS.  But if symptoms worsen we we can go ahead and check an MRI of the brain with and without.  She is already on several blood pressure medications so it would be difficult to add a beta-blocker to see if it helps the tremor." Will advise patient  Number provided to call office back if symptoms worsen or have any questions/concerns.

## 2022-04-08 ENCOUNTER — Ambulatory Visit (INDEPENDENT_AMBULATORY_CARE_PROVIDER_SITE_OTHER): Payer: Medicare Other | Admitting: Podiatry

## 2022-04-08 DIAGNOSIS — T8130XA Disruption of wound, unspecified, initial encounter: Secondary | ICD-10-CM | POA: Diagnosis not present

## 2022-04-08 NOTE — Progress Notes (Signed)
Subjective:  Patient ID: [name], female    DOB: 03-Nov-1960,  MRN: [id number]  Chief Complaint  Patient presents with   Fracture    Left ankle patient denies any pain, right foot has a callus    DOS: 11/25/2021 Procedure: ORIF of left ankle fracture  61 y.o. female returns for post-op check.  Patient states she is doing well.  The wound is improving.  She is weightbearing as tolerated regular shoes has improved considerably.  Review of Systems: Negative except as noted in the HPI. Denies N/V/F/Ch.  Past Medical History:  Diagnosis Date   Acute cystitis    Acute metabolic encephalopathy    Anemia    Arthritis    Chronic pain    Diabetes mellitus    Fibromyalgia    GERD (gastroesophageal reflux disease)    Hepatitis C    received treatment   History of methicillin resistant staphylococcus aureus (MRSA)    Hypercholesteremia    Hypertension    IBS (irritable bowel syndrome)    Multiple sclerosis exacerbation (HCC)    Neurogenic bladder    pt denies having to cath herself-says she voids on her own as of 11-21-21   Neuropathy    Obesity    Sacral nerve stimulator present    battery is dead and has not worked in years   Stroke [facility])    pt denies this but pcp has it in their history   Tachycardia    UTI (urinary tract infection)    Vitamin D deficiency     Current Outpatient Medications:    Accu-Chek Softclix Lancets lancets, Use as directed up to 4 times daily, Disp: 100 each, Rfl: 5   amantadine (SYMMETREL) 100 MG capsule, Take 1 capsule (100 mg total) by mouth 2 (two) times daily., Disp: 60 capsule, Rfl: 11   amitriptyline (ELAVIL) 25 MG tablet, TAKE 1 TABLET(25 MG) BY MOUTH AT BEDTIME, Disp: 30 tablet, Rfl: 5   amLODipine (NORVASC) 10 MG tablet, Take 1 tablet (10 mg total) by mouth daily. (Patient taking differently: Take 10 mg by mouth at bedtime.), Disp: , Rfl:    atorvastatin (LIPITOR) 80 MG tablet, Take 1 tablet (80 mg total) by mouth daily. (Patient taking  differently: Take 80 mg by mouth every evening.), Disp: 90 tablet, Rfl: 3   baclofen (LIORESAL) 20 MG tablet, Take 20 mg by mouth 2 (two) times daily., Disp: , Rfl:    Biotin w/ Vitamins C & E (HAIR/SKIN/NAILS PO), Take 3 tablets by mouth daily., Disp: , Rfl:    Blood Glucose Monitoring Suppl (BLOOD GLUCOSE MONITOR SYSTEM) w/Device KIT, use as directed (Patient not taking: Reported on 03/25/2022), Disp: 1 kit, Rfl: 0   Cholecalciferol (VITAMIN D-3) 125 MCG (5000 UT) TABS, Take 5,000 Units by mouth in the morning., Disp: , Rfl:    collagenase (SANTYL) 250 UNIT/GM ointment, Apply 1 Application topically daily., Disp: 15 g, Rfl: 0   dicyclomine (BENTYL) 20 MG tablet, Take 20 mg by mouth 2 (two) times daily., Disp: , Rfl:    diphenoxylate-atropine (LOMOTIL) 2.5-0.025 MG tablet, Take 3 tablets by mouth 2 (two) times daily., Disp: , Rfl:    doxepin (SINEQUAN) 10 MG capsule, Take 10 mg by mouth at bedtime. (Patient not taking: Reported on 03/25/2022), Disp: , Rfl:    doxycycline (VIBRA-TABS) 100 MG tablet, Take 1 tablet (100 mg total) by mouth 2 (two) times daily., Disp: 60 tablet, Rfl: 0   DULoxetine (CYMBALTA) 30 MG capsule, 30 mg 2 (two)  times daily. (Patient not taking: Reported on 03/25/2022), Disp: , Rfl:    DULoxetine (CYMBALTA) 60 MG capsule, Take 60 mg by mouth 2 (two) times daily., Disp: , Rfl:    Eszopiclone (LUNESTA PO), Take 1 tablet by mouth at bedtime., Disp: , Rfl:    famotidine (PEPCID) 20 MG tablet, Take 20 mg by mouth 2 (two) times daily., Disp: , Rfl:    fluticasone (FLONASE) 50 MCG/ACT nasal spray, Place 2 sprays into the nose daily. (Patient taking differently: Place 2 sprays into the nose in the morning and at bedtime.), Disp: 16 g, Rfl: 11   gabapentin (NEURONTIN) 100 MG capsule, Take 100 mg by mouth 3 (three) times daily as needed., Disp: , Rfl:    glucose blood (ACCU-CHEK GUIDE) test strip, Use as instructed up to 4 times daily (Patient not taking: Reported on 03/25/2022), Disp: 100  each, Rfl: 12   Homeopathic Products (LEG CRAMPS PO), Place 3 tablets under the tongue 2 (two) times daily. 3 am tablets and 3 pm tablets., Disp: , Rfl:    hydrALAZINE (APRESOLINE) 50 MG tablet, Take 1 tablet (50 mg total) by mouth every 8 (eight) hours., Disp: , Rfl:    ibuprofen (ADVIL) 200 MG tablet, Take 400 mg by mouth every 6 (six) hours as needed. (Patient not taking: Reported on 03/25/2022), Disp: , Rfl:    ibuprofen (ADVIL) 800 MG tablet, Take 800 mg by mouth every 8 (eight) hours as needed. (Patient not taking: Reported on 03/25/2022), Disp: , Rfl:    insulin glargine (LANTUS) 100 UNIT/ML Solostar Pen, Inject 40 Units into the skin daily. (Patient not taking: Reported on 03/25/2022), Disp: 15 mL, Rfl: 2   insulin lispro (HUMALOG) 100 UNIT/ML KwikPen, Inject 10 Units into the skin 3 (three) times daily with meals. (Patient not taking: Reported on 03/25/2022), Disp: 15 mL, Rfl: 2   Insulin Pen Needle 32G X 4 MM MISC, use as directed 4 times daily (Patient not taking: Reported on 03/25/2022), Disp: 200 each, Rfl: 2   lamoTRIgine (LAMICTAL) 200 MG tablet, TAKE 1 TABLET BY MOUTH TWICE DAILY (Patient taking differently: Take 200 mg by mouth 2 (two) times daily.), Disp: 60 tablet, Rfl: 11   lansoprazole (PREVACID) 30 MG capsule, Take 30 mg by mouth 2 (two) times daily before a meal., Disp: , Rfl:    loperamide (IMODIUM) 2 MG capsule, Take 1 capsule (2 mg total) by mouth 2 (two) times daily., Disp: 30 capsule, Rfl: 0   losartan (COZAAR) 100 MG tablet, Take 1 tablet (100 mg total) by mouth daily. (Patient taking differently: Take 100 mg by mouth at bedtime.), Disp: 90 tablet, Rfl: 3   losartan (COZAAR) 50 MG tablet, Take 100 mg by mouth daily. (Patient not taking: Reported on 03/25/2022), Disp: , Rfl:    MAGNESIUM-OXIDE 400 (240 Mg) MG tablet, Take 1 tablet by mouth daily., Disp: , Rfl:    MELATONIN MAXIMUM STRENGTH 5 MG TABS, Take 5 mg by mouth at bedtime., Disp: , Rfl:    montelukast (SINGULAIR) 10 MG  tablet, Take 10 mg by mouth at bedtime., Disp: , Rfl:    MOUNJARO 2.5 MG/0.5ML Pen, SMARTSIG:2.5 Milligram(s) SUB-Q Once a Week, Disp: , Rfl:    NARCAN 4 MG/0.1ML LIQD nasal spray kit, Place 1 spray into the nose once as needed (overdose)., Disp: , Rfl:    Oxcarbazepine (TRILEPTAL) 300 MG tablet, TAKE 1 TABLET(300 MG) BY MOUTH TWICE DAILY, Disp: 60 tablet, Rfl: 5   oxyCODONE (ROXICODONE) 15 MG immediate release  tablet, Take 15 mg by mouth in the morning, at noon, in the evening, and at bedtime. May take up to a total of 6 times per day if needed, Disp: , Rfl:    OZEMPIC, 0.25 OR 0.5 MG/DOSE, 2 MG/1.5ML SOPN, Inject into the skin once a week. Tuesdays (Patient not taking: Reported on 03/25/2022), Disp: , Rfl:    pantoprazole (PROTONIX) 40 MG tablet, 40 mg 2 (two) times daily., Disp: , Rfl:    sulfamethoxazole-trimethoprim (BACTRIM DS) 800-160 MG tablet, Take 1 tablet by mouth 2 (two) times daily., Disp: 60 tablet, Rfl: 0   sulfamethoxazole-trimethoprim (BACTRIM DS) 800-160 MG tablet, Take 1 tablet by mouth 2 (two) times daily., Disp: 28 tablet, Rfl: 0   tiZANidine (ZANAFLEX) 2 MG tablet, Take 2 mg by mouth as needed., Disp: , Rfl:    triamterene-hydrochlorothiazide (MAXZIDE-25) 37.5-25 MG tablet, Take 1 tablet by mouth every morning., Disp: , Rfl:    VIBERZI 100 MG TABS, Take 100 mg by mouth 2 (two) times daily., Disp: , Rfl:    vitamin B-12 (CYANOCOBALAMIN) 1000 MCG tablet, Take 1,000 mcg by mouth in the morning., Disp: , Rfl:    XIIDRA 5 % SOLN, Place 2 drops into both eyes in the morning and at bedtime., Disp: , Rfl:   Social History   Tobacco Use  Smoking Status Former   Packs/day: 0.25   Years: 12.00   Total pack years: 3.00   Types: Cigarettes   Quit date: 03/2021   Years since quitting: 1.0  Smokeless Tobacco Never  Tobacco Comments   06/13/20 1 pk per 3 days    No Known Allergies Objective:  There were no vitals filed for this visit. There is no height or weight on file to  calculate BMI. Constitutional Well developed. Well nourished.  Vascular Foot warm and well perfused. Capillary refill normal to all digits.   Neurologic Normal speech. Oriented to person, place, and time. Epicritic sensation to light touch grossly present bilaterally.  Dermatologic Wound dehiscence is noted as measured below.  Improving granulation tissue noted.  Adequate coverage noted.  Orthopedic: Very mild tenderness to palpation noted about the surgical site.   Radiographs: 3 views of skeletally mature adult left foot: Hardware is intact no signs of backing out noted from the proximal screw we will continue to keep an eye out on it.  Good correction reduction of fracture noted.  No bony abnormalities identified.  Consolidation noted callus noted.   Assessment:   No diagnosis found.   Plan:  Patient was evaluated and treated and all questions answered.  S/p foot surgery left -Progressing as expected post-operatively. -XR: See above -WB Status: Weightbearing as tolerated in regular shoes. -Sutures: This is noted measuring to be 1 cm x 1 m. -Medications: None -Continue Santyl wet-to-dry -ABIs PVRs were reviewed which show some noncompressible arteries however the waveforms are normal.  Clinically the wound is improving considerably.  We will continue to monitor and if there is no improvement or stagnation we discussed vascular consultation -Cultures were taken with no growth so far. -I discussed with her that she is a high risk of losing the leg if this worsens. -Her last A1c was 5.5. -All labs within normal limits -Continue antibiotics  No follow-ups on file.   Wound decreasing.  Surgical shoe.  Ambulate as tolerated

## 2022-05-04 ENCOUNTER — Ambulatory Visit: Payer: Medicare Other | Admitting: Podiatry

## 2022-05-20 ENCOUNTER — Ambulatory Visit (INDEPENDENT_AMBULATORY_CARE_PROVIDER_SITE_OTHER): Payer: Medicare Other | Admitting: Podiatry

## 2022-05-20 VITALS — BP 130/78

## 2022-05-20 DIAGNOSIS — T8130XA Disruption of wound, unspecified, initial encounter: Secondary | ICD-10-CM

## 2022-05-20 NOTE — Progress Notes (Signed)
Subjective:  Patient ID: [name], female    DOB: [birth year],  MRN: [id number]  Chief Complaint  Patient presents with   Wound Check    Pt stated that she is doing much better  No pain or discomfort at this time     DOS: 11/25/2021 Procedure: ORIF of left ankle fracture  62 y.o. female returns for post-op check.  Patient states she is doing well.  The wound is improving.  She is weightbearing as tolerated regular shoes has improved considerably.  Review of Systems: Negative except as noted in the HPI. Denies N/V/F/Ch.  Past Medical History:  Diagnosis Date   Acute cystitis    Acute metabolic encephalopathy    Anemia    Arthritis    Chronic pain    Diabetes mellitus    Fibromyalgia    GERD (gastroesophageal reflux disease)    Hepatitis C    received treatment   History of methicillin resistant staphylococcus aureus (MRSA)    Hypercholesteremia    Hypertension    IBS (irritable bowel syndrome)    Multiple sclerosis exacerbation (HCC)    Neurogenic bladder    pt denies having to cath herself-says she voids on her own as of 11-21-21   Neuropathy    Obesity    Sacral nerve stimulator present    battery is dead and has not worked in years   Stroke [facility])    pt denies this but pcp has it in their history   Tachycardia    UTI (urinary tract infection)    Vitamin D deficiency     Current Outpatient Medications:    Accu-Chek Softclix Lancets lancets, Use as directed up to 4 times daily, Disp: 100 each, Rfl: 5   amantadine (SYMMETREL) 100 MG capsule, Take 1 capsule (100 mg total) by mouth 2 (two) times daily., Disp: 60 capsule, Rfl: 11   amitriptyline (ELAVIL) 25 MG tablet, TAKE 1 TABLET(25 MG) BY MOUTH AT BEDTIME, Disp: 30 tablet, Rfl: 5   amLODipine (NORVASC) 10 MG tablet, Take 1 tablet (10 mg total) by mouth daily. (Patient taking differently: Take 10 mg by mouth at bedtime.), Disp: , Rfl:    atorvastatin (LIPITOR) 80 MG tablet, Take 1 tablet (80 mg total) by mouth  daily. (Patient taking differently: Take 80 mg by mouth every evening.), Disp: 90 tablet, Rfl: 3   baclofen (LIORESAL) 20 MG tablet, Take 20 mg by mouth 2 (two) times daily., Disp: , Rfl:    Biotin w/ Vitamins C & E (HAIR/SKIN/NAILS PO), Take 3 tablets by mouth daily., Disp: , Rfl:    Blood Glucose Monitoring Suppl (BLOOD GLUCOSE MONITOR SYSTEM) w/Device KIT, use as directed (Patient not taking: Reported on 03/25/2022), Disp: 1 kit, Rfl: 0   Cholecalciferol (VITAMIN D-3) 125 MCG (5000 UT) TABS, Take 5,000 Units by mouth in the morning., Disp: , Rfl:    collagenase (SANTYL) 250 UNIT/GM ointment, Apply 1 Application topically daily., Disp: 15 g, Rfl: 0   dicyclomine (BENTYL) 20 MG tablet, Take 20 mg by mouth 2 (two) times daily., Disp: , Rfl:    diphenoxylate-atropine (LOMOTIL) 2.5-0.025 MG tablet, Take 3 tablets by mouth 2 (two) times daily., Disp: , Rfl:    doxepin (SINEQUAN) 10 MG capsule, Take 10 mg by mouth at bedtime. (Patient not taking: Reported on 03/25/2022), Disp: , Rfl:    doxycycline (VIBRA-TABS) 100 MG tablet, Take 1 tablet (100 mg total) by mouth 2 (two) times daily., Disp: 60 tablet, Rfl: 0   DULoxetine (CYMBALTA)  30 MG capsule, 30 mg 2 (two) times daily. (Patient not taking: Reported on 03/25/2022), Disp: , Rfl:    DULoxetine (CYMBALTA) 60 MG capsule, Take 60 mg by mouth 2 (two) times daily., Disp: , Rfl:    Eszopiclone (LUNESTA PO), Take 1 tablet by mouth at bedtime., Disp: , Rfl:    famotidine (PEPCID) 20 MG tablet, Take 20 mg by mouth 2 (two) times daily., Disp: , Rfl:    fluticasone (FLONASE) 50 MCG/ACT nasal spray, Place 2 sprays into the nose daily. (Patient taking differently: Place 2 sprays into the nose in the morning and at bedtime.), Disp: 16 g, Rfl: 11   gabapentin (NEURONTIN) 100 MG capsule, Take 100 mg by mouth 3 (three) times daily as needed., Disp: , Rfl:    glucose blood (ACCU-CHEK GUIDE) test strip, Use as instructed up to 4 times daily (Patient not taking: Reported on  03/25/2022), Disp: 100 each, Rfl: 12   Homeopathic Products (LEG CRAMPS PO), Place 3 tablets under the tongue 2 (two) times daily. 3 am tablets and 3 pm tablets., Disp: , Rfl:    hydrALAZINE (APRESOLINE) 50 MG tablet, Take 1 tablet (50 mg total) by mouth every 8 (eight) hours., Disp: , Rfl:    ibuprofen (ADVIL) 200 MG tablet, Take 400 mg by mouth every 6 (six) hours as needed. (Patient not taking: Reported on 03/25/2022), Disp: , Rfl:    ibuprofen (ADVIL) 800 MG tablet, Take 800 mg by mouth every 8 (eight) hours as needed. (Patient not taking: Reported on 03/25/2022), Disp: , Rfl:    insulin glargine (LANTUS) 100 UNIT/ML Solostar Pen, Inject 40 Units into the skin daily. (Patient not taking: Reported on 03/25/2022), Disp: 15 mL, Rfl: 2   insulin lispro (HUMALOG) 100 UNIT/ML KwikPen, Inject 10 Units into the skin 3 (three) times daily with meals. (Patient not taking: Reported on 03/25/2022), Disp: 15 mL, Rfl: 2   Insulin Pen Needle 32G X 4 MM MISC, use as directed 4 times daily (Patient not taking: Reported on 03/25/2022), Disp: 200 each, Rfl: 2   lamoTRIgine (LAMICTAL) 200 MG tablet, TAKE 1 TABLET BY MOUTH TWICE DAILY (Patient taking differently: Take 200 mg by mouth 2 (two) times daily.), Disp: 60 tablet, Rfl: 11   lansoprazole (PREVACID) 30 MG capsule, Take 30 mg by mouth 2 (two) times daily before a meal., Disp: , Rfl:    loperamide (IMODIUM) 2 MG capsule, Take 1 capsule (2 mg total) by mouth 2 (two) times daily., Disp: 30 capsule, Rfl: 0   losartan (COZAAR) 100 MG tablet, Take 1 tablet (100 mg total) by mouth daily. (Patient taking differently: Take 100 mg by mouth at bedtime.), Disp: 90 tablet, Rfl: 3   losartan (COZAAR) 50 MG tablet, Take 100 mg by mouth daily. (Patient not taking: Reported on 03/25/2022), Disp: , Rfl:    MAGNESIUM-OXIDE 400 (240 Mg) MG tablet, Take 1 tablet by mouth daily., Disp: , Rfl:    MELATONIN MAXIMUM STRENGTH 5 MG TABS, Take 5 mg by mouth at bedtime., Disp: , Rfl:     montelukast (SINGULAIR) 10 MG tablet, Take 10 mg by mouth at bedtime., Disp: , Rfl:    MOUNJARO 2.5 MG/0.5ML Pen, SMARTSIG:2.5 Milligram(s) SUB-Q Once a Week, Disp: , Rfl:    NARCAN 4 MG/0.1ML LIQD nasal spray kit, Place 1 spray into the nose once as needed (overdose)., Disp: , Rfl:    Oxcarbazepine (TRILEPTAL) 300 MG tablet, TAKE 1 TABLET(300 MG) BY MOUTH TWICE DAILY, Disp: 60 tablet, Rfl: 5  oxyCODONE (ROXICODONE) 15 MG immediate release tablet, Take 15 mg by mouth in the morning, at noon, in the evening, and at bedtime. May take up to a total of 6 times per day if needed, Disp: , Rfl:    OZEMPIC, 0.25 OR 0.5 MG/DOSE, 2 MG/1.5ML SOPN, Inject into the skin once a week. Tuesdays (Patient not taking: Reported on 03/25/2022), Disp: , Rfl:    pantoprazole (PROTONIX) 40 MG tablet, 40 mg 2 (two) times daily., Disp: , Rfl:    sulfamethoxazole-trimethoprim (BACTRIM DS) 800-160 MG tablet, Take 1 tablet by mouth 2 (two) times daily., Disp: 60 tablet, Rfl: 0   sulfamethoxazole-trimethoprim (BACTRIM DS) 800-160 MG tablet, Take 1 tablet by mouth 2 (two) times daily., Disp: 28 tablet, Rfl: 0   tiZANidine (ZANAFLEX) 2 MG tablet, Take 2 mg by mouth as needed., Disp: , Rfl:    triamterene-hydrochlorothiazide (MAXZIDE-25) 37.5-25 MG tablet, Take 1 tablet by mouth every morning., Disp: , Rfl:    VIBERZI 100 MG TABS, Take 100 mg by mouth 2 (two) times daily., Disp: , Rfl:    vitamin B-12 (CYANOCOBALAMIN) 1000 MCG tablet, Take 1,000 mcg by mouth in the morning., Disp: , Rfl:    XIIDRA 5 % SOLN, Place 2 drops into both eyes in the morning and at bedtime., Disp: , Rfl:   Social History   Tobacco Use  Smoking Status Former   Packs/day: 0.25   Years: 12.00   Total pack years: 3.00   Types: Cigarettes   Quit date: 03/2021   Years since quitting: 1.1  Smokeless Tobacco Never  Tobacco Comments   06/13/20 1 pk per 3 days    No Known Allergies Objective:   Vitals:   05/20/22 0857  BP: 130/78   There is no  height or weight on file to calculate BMI. Constitutional Well developed. Well nourished.  Vascular Foot warm and well perfused. Capillary refill normal to all digits.   Neurologic Normal speech. Oriented to person, place, and time. Epicritic sensation to light touch grossly present bilaterally.  Dermatologic Wound completely resolved.  No signs of dehiscence noted.  Good range of motion noted at the ankle joint.  No crepitus clinically appreciated.  No deep intra-articular ankle pain noted  Orthopedic: Very mild tenderness to palpation noted about the surgical site.   Radiographs: 3 views of skeletally mature adult left foot: Hardware is intact no signs of backing out noted from the proximal screw we will continue to keep an eye out on it.  Good correction reduction of fracture noted.  No bony abnormalities identified.  Consolidation noted callus noted.   Assessment:   No diagnosis found.   Plan:  Patient was evaluated and treated and all questions answered.  S/p foot surgery left -Clinically healed officially discharged from my care.  She has not been ambulating with regular shoes without any restriction ankle range of motion is within normal limits.  No crepitus clinically appreciated.  No follow-ups on file.   Wound decreasing.  Surgical shoe.  Ambulate as tolerated

## 2022-06-18 ENCOUNTER — Other Ambulatory Visit: Payer: Self-pay | Admitting: Neurology

## 2022-06-26 IMAGING — DX DG ANKLE COMPLETE 3+V*L*
3 series · 3 of 3 positions shown · non-contrast
Comparison: Ankle radiographs 03/26/2010 (images available, report
unavailable).

CLINICAL DATA: Provided history: Swelling. Additional history
provided: Left ankle pain status post fall which occurred [REDACTED]
night.

EXAM:
LEFT ANKLE COMPLETE - 3+ VIEW

[ankle ap]
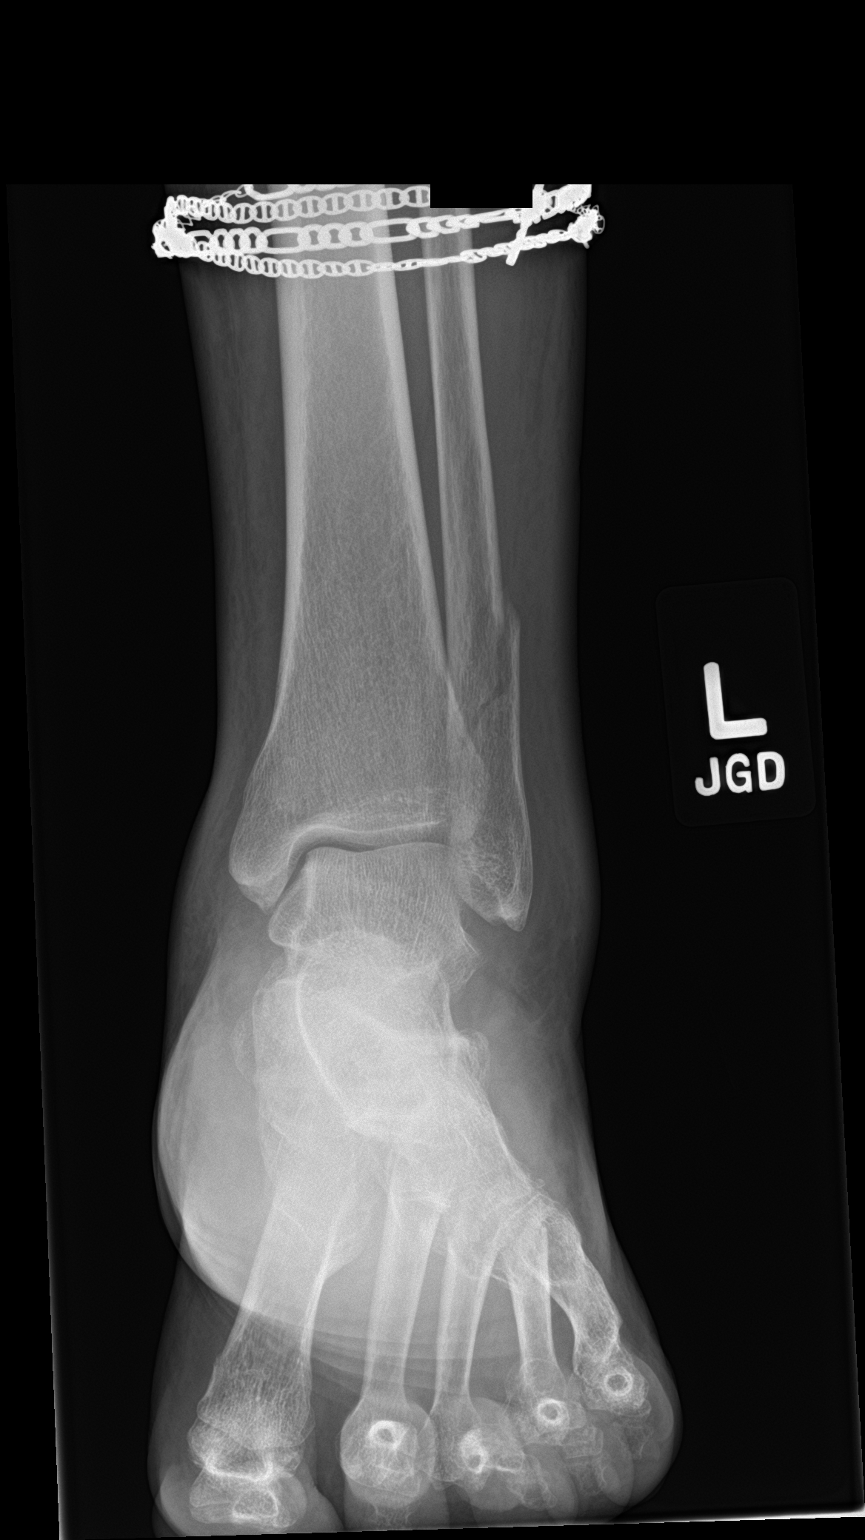

[ankle obl]
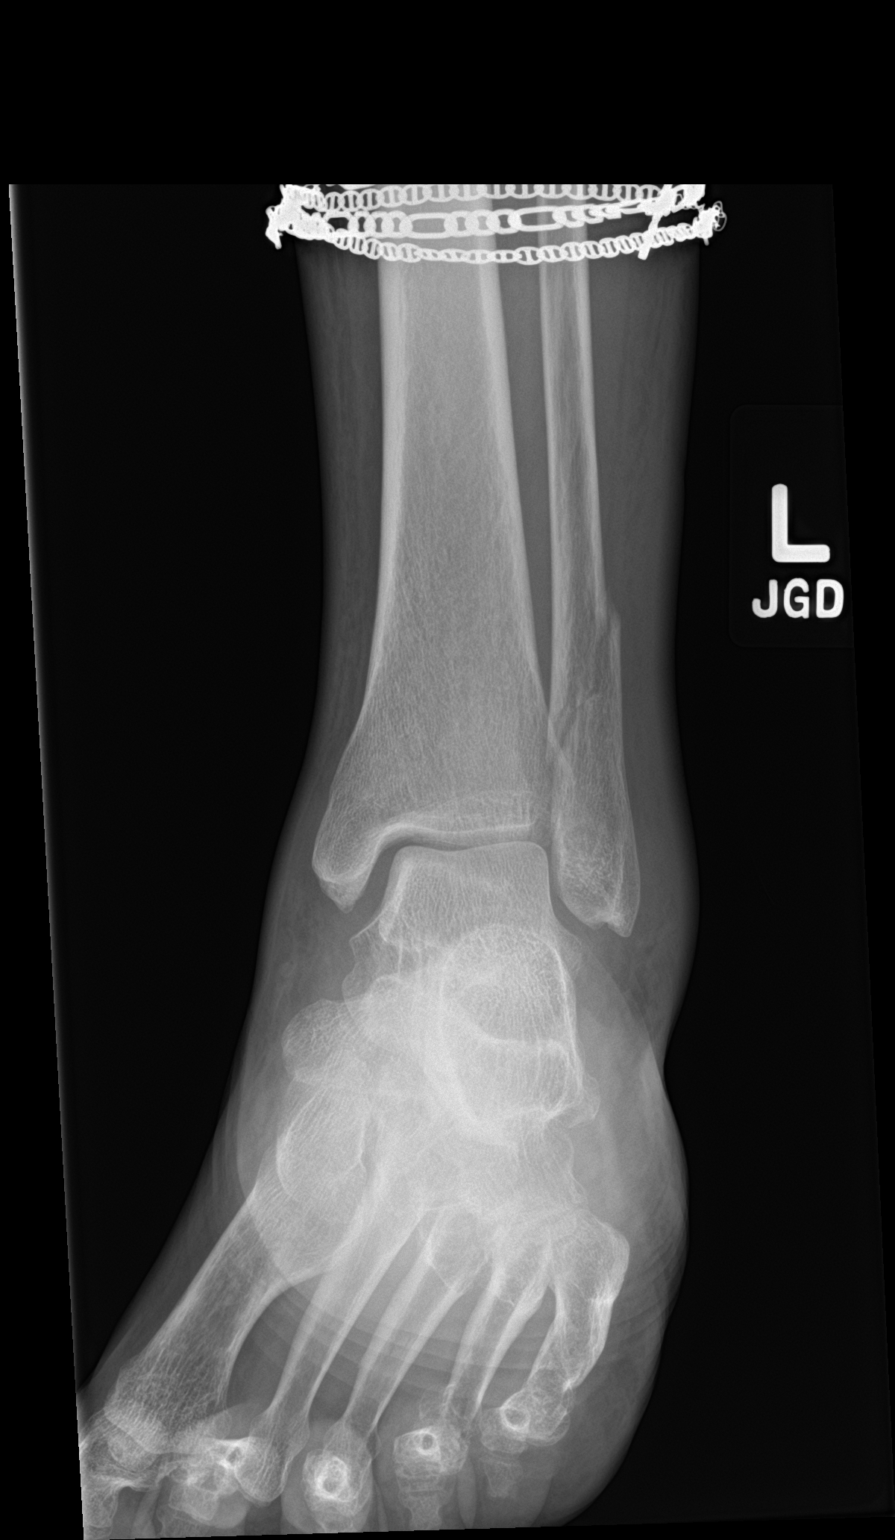

[ankle lat]
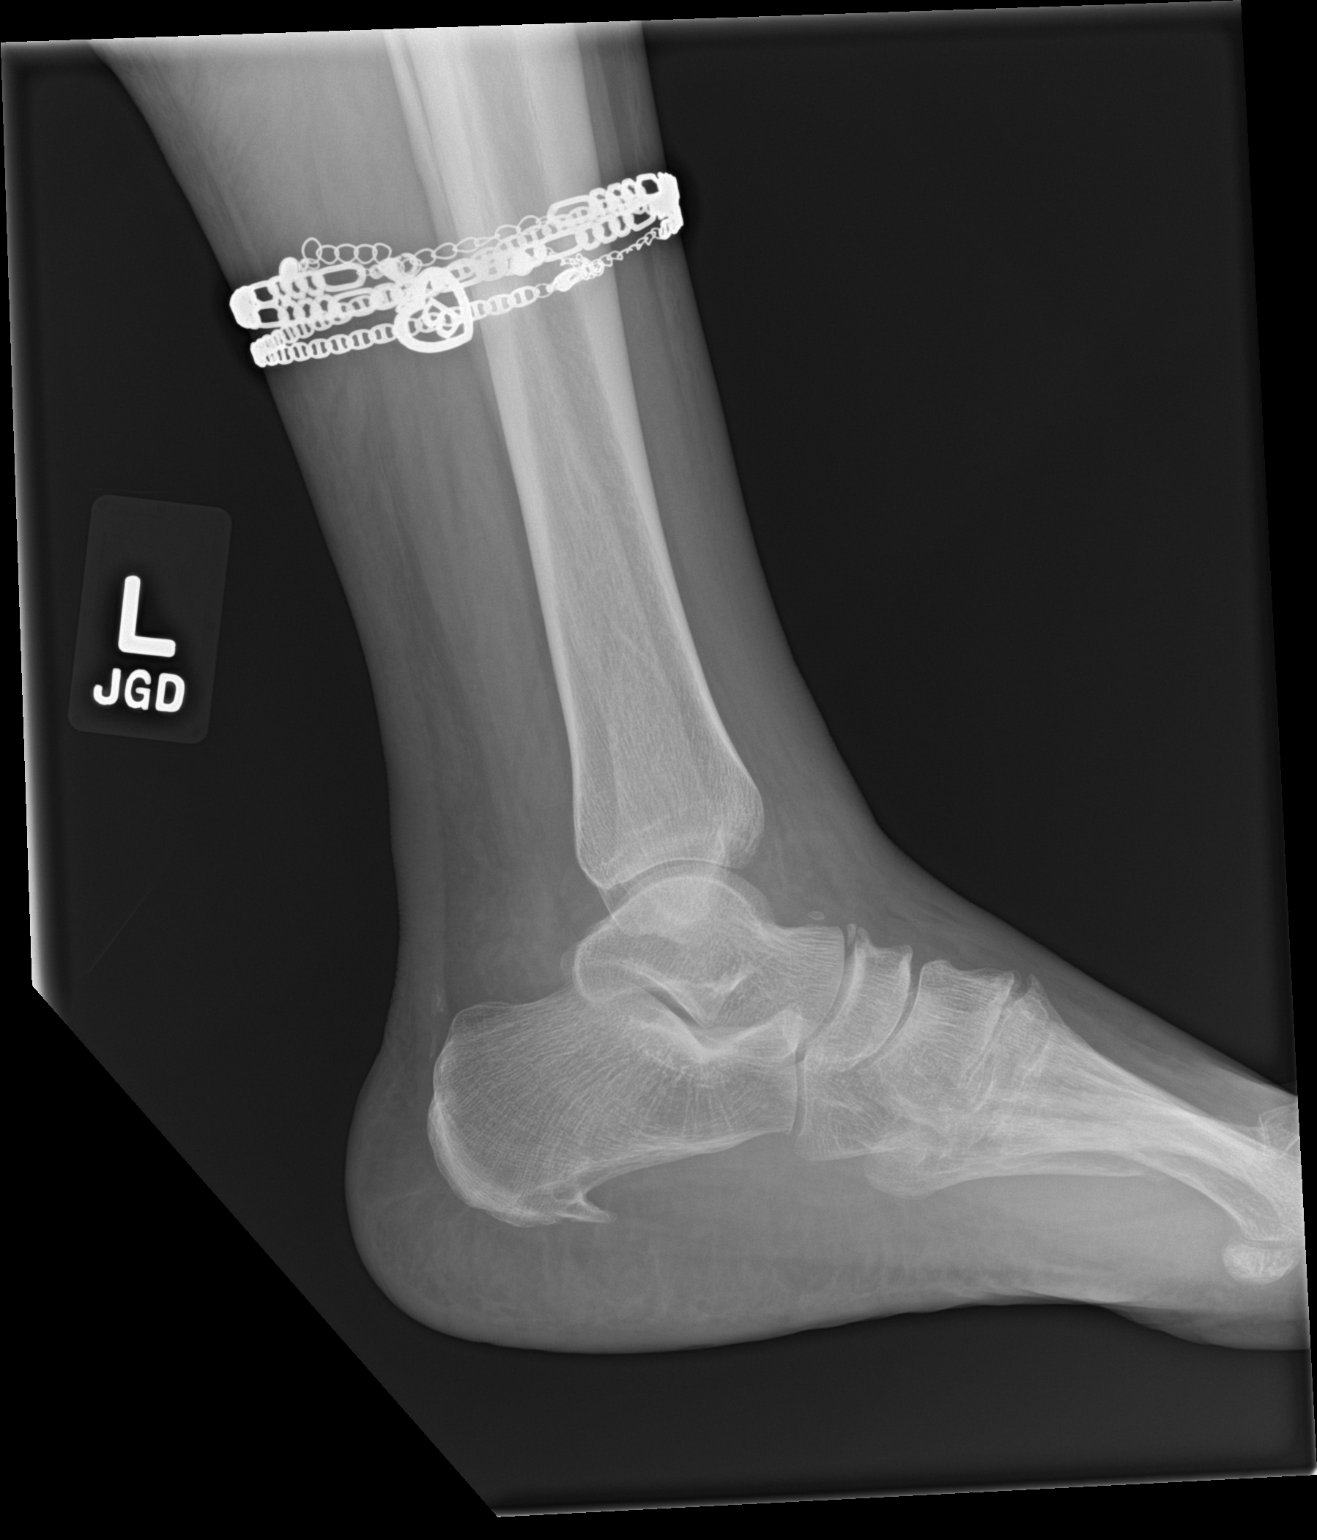

[3 of 3 positions shown; findings below may reference images not displayed]

FINDINGS: There is an acute, mildly displaced fracture of the distal fibular
metaphysis. Additionally, there is abnormal widening of the medial
clear space to 3-4 mm, suggesting instability. Soft tissue swelling
about the ankle and proximal foot. Plantar calcaneal spurring.
IMPRESSION: Acute, mildly displaced fracture of the distal fibular metaphysis.

Abnormal widening of the medial clear space to 3-4 mm, suggesting
instability.

Overlying soft tissue swelling about the ankle and proximal foot.

## 2022-07-06 ENCOUNTER — Encounter: Payer: Self-pay | Admitting: Neurology

## 2022-07-06 ENCOUNTER — Ambulatory Visit (INDEPENDENT_AMBULATORY_CARE_PROVIDER_SITE_OTHER): Payer: 59 | Admitting: Neurology

## 2022-07-06 VITALS — BP 133/81 | HR 93 | Ht 63.0 in | Wt 254.5 lb

## 2022-07-06 DIAGNOSIS — Z79899 Other long term (current) drug therapy: Secondary | ICD-10-CM

## 2022-07-06 DIAGNOSIS — G8929 Other chronic pain: Secondary | ICD-10-CM | POA: Diagnosis not present

## 2022-07-06 DIAGNOSIS — G35 Multiple sclerosis: Secondary | ICD-10-CM

## 2022-07-06 DIAGNOSIS — R269 Unspecified abnormalities of gait and mobility: Secondary | ICD-10-CM

## 2022-07-06 NOTE — Progress Notes (Signed)
[facility]  PATIENT: [name] DOB: Dec 16, 1960  REFERRING DOCTOR OR PCP: Lucky Cowboy, FNP SOURCE: Patient, notes from primary care, imaging and lab reports, MRI images personally reviewed.  _________________________________   HISTORICAL  CHIEF COMPLAINT:  Chief Complaint  Patient presents with   Room 10    Pt is here Alone. Pt states that she has tremors in her hand and in her legs. Pt states that she doesn't hold anything in her hands because she will drop it. Pt states that she had fallen Last Monday.      HISTORY OF PRESENT ILLNESS:  [name] is a 62 y.o. woman with multiple sclerosis, LBP and gait disturbance  Update 09/16/2021:.  She is on Tecficdera and tolerates it well..  THe above issues have been her first exacerbation (though no definite change on MRI - no enhancing lesions  to explain symptoms)      Lab work from 03/25/2022 was normal or noncontributory   Lymphocyte count was 1.6  Last year, the hepatitis C antibody was positive so we did the RNA test.  It was negative.  Therefore, she does not appear to have an active infection.  She reports more difficulty with her vision.   The left eye is  blurry on its own but she notes diplopia with both eyes open..   .  She notes more tremor.  She feels gait is worse and stumbles some.     She feels close to baseline but still feels a little more unsteady and notes more tremor in hands.     She feels her legs are more wobbly.   She uses the walker at times but the cane more.    With her cane she can go 100 feet most days.  She does not use the walker much.  The left leg buckles and she falls   She is having some difficulty doing activities of daily living.   We tried  dalfampridine but it did not help.  Amantadine has not helped     She has a burning dysesthetic pain in her arms and legs.   Sometimes her foot is red.     Besides MS she has NIDDM (was n insulin, now on Mounjaro).  She is on  lamotrigine as well for pain.   Gabapentin had not helped and she gained weight on it .  Her vision is doing about the same.  She has urinary urgency and frequency.  Mood is doing well and she denies depression or anxiety.    She is on Cymbalta 60 mg po qd and lamotrigine 200 mg po bid.     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  She is on eszopiclone   She sees [facility] and other controlled substances.     She is on [facility] and has lost a few pounds   She also has LBP and sees Dr. Christoper Fabian for pain management and is on  oxycodone 15 mg q6  She had MBBB/RFA helped the right side but not the left side.  Surgery has been discussed with her.   She is also on tizanidine, baclofen and amitriptyline to help her pain.   She sees Dr. Maia Petties.    Pain is all over.     MS HISTORY She was diagnosed with MS in 2008 after presenting with leg weakness and headache, neck pain, back pain.  MRIs showed foci in her cervical spine and  brain.   She was started on Rebif.   Initially she did well but she reports having a lot of relapses.   She was switched to Copaxone but also had more symptoms.   She was then switched to Tysabri for several years but reports having several relapses.   She switched to Tecfidera 5 years ago.   She reports having a flare in 2015 and had 5 days of IV Solu-medrol.   July 2021, she was complaining of blurry vision, stiff neck and increased burning and tingling a few weeks ago.  She went to the ED and had MRIs performed showing no new lesions.    IMAGING: MR brain and spinal cord 2/20showed no new lesions  MRI of the cervical spine 11/18/2019 and 03/02/2012 showed two T2 hyperintense foci adjacent to C2 and C3-C4.  There was mild multilevel degenerative changes as well.    MRI of the brain 11/18/2019 and 03/02/2020 show a fairly low plaque burden of T2/FLAIR hyperintense foci.  Most of them are nonspecific.  A few are periventricular.   None acute and no change over time.  MRI of the cervical spine 02/18/2021 showed no new lesions an stable DJD with C5C6 and C6C7 mild spinal senosis  MRI of the brain 02/18/2021 showed no change compared to 11/18/2019  REVIEW OF SYSTEMS: Constitutional: No fevers, chills, sweats, or change in appetite.  She has fatigue and she reports insomnia Eyes: No visual changes, double vision, eye pain Ear, nose and throat: No hearing loss, ear pain, nasal congestion, sore throat Cardiovascular: No chest pain, palpitations Respiratory:  No shortness of breath at rest or with exertion.   No wheezes GastrointestinaI: No nausea, vomiting, diarrhea, abdominal pain, fecal incontinence Genitourinary:  No dysuria, urinary retention or frequency.  No nocturia. Musculoskeletal: Pain all over in muscles, joints and spine Integumentary: No rash, pruritus, skin lesions Neurological: as above Psychiatric: She has depression and anxiety Endocrine: No palpitations, diaphoresis, change in appetite, change in weigh or increased thirst Hematologic/Lymphatic:  No anemia, purpura, petechiae. Allergic/Immunologic: No itchy/runny eyes, nasal congestion, recent allergic reactions, rashes  ALLERGIES: No Known Allergies  HOME MEDICATIONS:  Current Outpatient Medications:    amantadine (SYMMETREL) 100 MG capsule, Take 1 capsule (100 mg total) by mouth 2 (two) times daily., Disp: 60 capsule, Rfl: 11   amitriptyline (ELAVIL) 25 MG tablet, TAKE 1 TABLET(25 MG) BY MOUTH AT BEDTIME, Disp: 30 tablet, Rfl: 5   amLODipine (NORVASC) 10 MG tablet, Take 1 tablet (10 mg total) by mouth daily. (Patient taking differently: Take 10 mg by mouth at bedtime.), Disp: , Rfl:    atorvastatin (LIPITOR) 80 MG tablet, Take 1 tablet (80 mg total) by mouth daily. (Patient taking differently: Take 80 mg by mouth every evening.), Disp: 90 tablet, Rfl: 3   baclofen (LIORESAL) 20 MG tablet, Take 20 mg by mouth 2 (two) times daily., Disp: , Rfl:     Biotin w/ Vitamins C & E (HAIR/SKIN/NAILS PO), Take 3 tablets by mouth daily., Disp: , Rfl:    Cholecalciferol (VITAMIN D-3) 125 MCG (5000 UT) TABS, Take 5,000 Units by mouth in the morning., Disp: , Rfl:    collagenase (SANTYL) 250 UNIT/GM ointment, Apply 1 Application topically daily., Disp: 15 g, Rfl: 0   dicyclomine (BENTYL) 20 MG tablet, Take 20 mg by mouth 2 (two) times daily., Disp: , Rfl:    diphenoxylate-atropine (LOMOTIL) 2.5-0.025 MG tablet, Take 3 tablets by mouth 2 (two) times daily., Disp: , Rfl:    doxepin (  SINEQUAN) 10 MG capsule, Take 10 mg by mouth at bedtime., Disp: , Rfl:    doxycycline (VIBRA-TABS) 100 MG tablet, Take 1 tablet (100 mg total) by mouth 2 (two) times daily., Disp: 60 tablet, Rfl: 0   DULoxetine (CYMBALTA) 60 MG capsule, Take 60 mg by mouth 2 (two) times daily., Disp: , Rfl:    Eszopiclone (LUNESTA PO), Take 1 tablet by mouth at bedtime., Disp: , Rfl:    famotidine (PEPCID) 20 MG tablet, Take 20 mg by mouth 2 (two) times daily., Disp: , Rfl:    fluticasone (FLONASE) 50 MCG/ACT nasal spray, Place 2 sprays into the nose daily. (Patient taking differently: Place 2 sprays into the nose in the morning and at bedtime.), Disp: 16 g, Rfl: 11   gabapentin (NEURONTIN) 100 MG capsule, Take 100 mg by mouth 3 (three) times daily as needed., Disp: , Rfl:    Homeopathic Products (LEG CRAMPS PO), Place 3 tablets under the tongue 2 (two) times daily. 3 am tablets and 3 pm tablets., Disp: , Rfl:    hydrALAZINE (APRESOLINE) 50 MG tablet, Take 1 tablet (50 mg total) by mouth every 8 (eight) hours., Disp: , Rfl:    ibuprofen (ADVIL) 200 MG tablet, Take 400 mg by mouth every 6 (six) hours as needed., Disp: , Rfl:    ibuprofen (ADVIL) 800 MG tablet, Take 800 mg by mouth every 8 (eight) hours as needed., Disp: , Rfl:    lamoTRIgine (LAMICTAL) 200 MG tablet, TAKE 1 TABLET BY MOUTH TWICE DAILY, Disp: 60 tablet, Rfl: 5   lansoprazole (PREVACID) 30 MG capsule, Take 30 mg by mouth 2 (two) times  daily before a meal., Disp: , Rfl:    loperamide (IMODIUM) 2 MG capsule, Take 1 capsule (2 mg total) by mouth 2 (two) times daily., Disp: 30 capsule, Rfl: 0   losartan (COZAAR) 100 MG tablet, Take 1 tablet (100 mg total) by mouth daily. (Patient taking differently: Take 100 mg by mouth at bedtime.), Disp: 90 tablet, Rfl: 3   MAGNESIUM-OXIDE 400 (240 Mg) MG tablet, Take 1 tablet by mouth daily., Disp: , Rfl:    MELATONIN MAXIMUM STRENGTH 5 MG TABS, Take 5 mg by mouth at bedtime., Disp: , Rfl:    montelukast (SINGULAIR) 10 MG tablet, Take 10 mg by mouth at bedtime., Disp: , Rfl:    MOUNJARO 2.5 MG/0.5ML Pen, SMARTSIG:2.5 Milligram(s) SUB-Q Once a Week, Disp: , Rfl:    NARCAN 4 MG/0.1ML LIQD nasal spray kit, Place 1 spray into the nose once as needed (overdose)., Disp: , Rfl:    Oxcarbazepine (TRILEPTAL) 300 MG tablet, TAKE 1 TABLET(300 MG) BY MOUTH TWICE DAILY, Disp: 60 tablet, Rfl: 5   oxyCODONE (ROXICODONE) 15 MG immediate release tablet, Take 15 mg by mouth in the morning, at noon, in the evening, and at bedtime. May take up to a total of 6 times per day if needed, Disp: , Rfl:    pantoprazole (PROTONIX) 40 MG tablet, 40 mg 2 (two) times daily., Disp: , Rfl:    sulfamethoxazole-trimethoprim (BACTRIM DS) 800-160 MG tablet, Take 1 tablet by mouth 2 (two) times daily., Disp: 28 tablet, Rfl: 0   tiZANidine (ZANAFLEX) 2 MG tablet, Take 2 mg by mouth as needed., Disp: , Rfl:    triamterene-hydrochlorothiazide (MAXZIDE-25) 37.5-25 MG tablet, Take 1 tablet by mouth every morning., Disp: , Rfl:    VIBERZI 100 MG TABS, Take 100 mg by mouth 2 (two) times daily., Disp: , Rfl:    vitamin B-12 (CYANOCOBALAMIN)  1000 MCG tablet, Take 1,000 mcg by mouth in the morning., Disp: , Rfl:    XIIDRA 5 % SOLN, Place 2 drops into both eyes in the morning and at bedtime., Disp: , Rfl:    Accu-Chek Softclix Lancets lancets, Use as directed up to 4 times daily (Patient not taking: Reported on 07/06/2022), Disp: 100 each, Rfl:  5   Blood Glucose Monitoring Suppl (BLOOD GLUCOSE MONITOR SYSTEM) w/Device KIT, use as directed (Patient not taking: Reported on 03/25/2022), Disp: 1 kit, Rfl: 0   DULoxetine (CYMBALTA) 30 MG capsule, 30 mg 2 (two) times daily. (Patient not taking: Reported on 07/06/2022), Disp: , Rfl:    glucose blood (ACCU-CHEK GUIDE) test strip, Use as instructed up to 4 times daily (Patient not taking: Reported on 03/25/2022), Disp: 100 each, Rfl: 12   insulin glargine (LANTUS) 100 UNIT/ML Solostar Pen, Inject 40 Units into the skin daily. (Patient not taking: Reported on 03/25/2022), Disp: 15 mL, Rfl: 2   insulin lispro (HUMALOG) 100 UNIT/ML KwikPen, Inject 10 Units into the skin 3 (three) times daily with meals. (Patient not taking: Reported on 03/25/2022), Disp: 15 mL, Rfl: 2   Insulin Pen Needle 32G X 4 MM MISC, use as directed 4 times daily (Patient not taking: Reported on 03/25/2022), Disp: 200 each, Rfl: 2   losartan (COZAAR) 50 MG tablet, Take 100 mg by mouth daily. (Patient not taking: Reported on 07/06/2022), Disp: , Rfl:    OZEMPIC, 0.25 OR 0.5 MG/DOSE, 2 MG/1.5ML SOPN, Inject into the skin once a week. Tuesdays (Patient not taking: Reported on 03/25/2022), Disp: , Rfl:    sulfamethoxazole-trimethoprim (BACTRIM DS) 800-160 MG tablet, Take 1 tablet by mouth 2 (two) times daily. (Patient not taking: Reported on 07/06/2022), Disp: 60 tablet, Rfl: 0  PAST MEDICAL HISTORY: Past Medical History:  Diagnosis Date   Acute cystitis    Acute metabolic encephalopathy    Anemia    Arthritis    Chronic pain    Diabetes mellitus    Fibromyalgia    GERD (gastroesophageal reflux disease)    Hepatitis C    received treatment   History of methicillin resistant staphylococcus aureus (MRSA)    Hypercholesteremia    Hypertension    IBS (irritable bowel syndrome)    Multiple sclerosis exacerbation (HCC)    Neurogenic bladder    pt denies having to cath herself-says she voids on her own as of 11-21-21   Neuropathy     Obesity    Sacral nerve stimulator present    battery is dead and has not worked in years   Stroke [facility])    pt denies this but pcp has it in their history   Tachycardia    UTI (urinary tract infection)    Vitamin D deficiency     PAST SURGICAL HISTORY: Past Surgical History:  Procedure Laterality Date   ABDOMINAL HYSTERECTOMY     partial   I & D EXTREMITY Right 12/14/2020   Procedure: IRRIGATION AND DEBRIDEMENT RIGHT LOWER EXTREMITY;  Surgeon: Rod Can, MD;  Location: [place];  Service: Orthopedics;  Laterality: Right;   JOINT REPLACEMENT     LTK   ORIF ANKLE FRACTURE Left 11/25/2021   Procedure: OPEN REDUCTION INTERNAL FIXATION (ORIF) LEFT ANKLE FRACTURE;  Surgeon: Felipa Furnace, DPM;  Location: [facility];  Service: Orthopedics;  Laterality: Left;  POPLITEAL BLOCK   RADIOLOGY WITH ANESTHESIA N/A 02/18/2021   Procedure: MRI WITH ANESTHESIA CERVICAL SPINE WITH AND WITHOUT CONTRAST AND BRAIN WITH AND WITHOUT  CONTRAST;  Surgeon: Radiologist, Medication, MD;  Location: [place];  Service: Radiology;  Laterality: N/A;   RADIOLOGY WITH ANESTHESIA N/A 07/07/2021   Procedure: MRI WITH ANESTHESIA;  Surgeon: Radiologist, Medication, MD;  Location: [place];  Service: Radiology;  Laterality: N/A;   SACRAL NERVE STIMULATOR PLACEMENT     [employer]   SYNDESMOSIS REPAIR Left 11/25/2021   Procedure: SYNDESMOSIS REPAIR;  Surgeon: Felipa Furnace, DPM;  Location: [facility];  Service: Orthopedics;  Laterality: Left;   TUBAL LIGATION      FAMILY HISTORY: Family History  Problem Relation Age of Onset   Diabetes Mother    Hypertension Mother    Stroke Father     SOCIAL HISTORY:  Social History   Socioeconomic History   Marital status: Single    Spouse name: Not on file   Number of children: 2   Years of education: Not on file   Highest education level: Not on file  Occupational History   Not on file  Tobacco Use   Smoking status: Former    Packs/day: 0.25    Years: 12.00    Total pack years:  3.00    Types: Cigarettes    Quit date: 03/2021    Years since quitting: 1.3   Smokeless tobacco: Never   Tobacco comments:    06/13/20 1 pk per 3 days  Substance and Sexual Activity   Alcohol use: No   Drug use: No   Sexual activity: Not on file  Other Topics Concern   Not on file  Social History Narrative   06/13/20 lives alone, dgtr across the street   Social Determinants of Health   Financial Resource Strain: Not on file  Food Insecurity: Not on file  Transportation Needs: Not on file  Physical Activity: Not on file  Stress: Not on file  Social Connections: Not on file  Intimate Partner Violence: Not on file     PHYSICAL EXAM  Vitals:   07/06/22 1250  BP: 133/81  Pulse: 93  Weight: 254 lb 8 oz (115.4 kg)  Height: 5' 3"$  (1.6 m)    Body mass index is 45.08 kg/m.   General: The patient is well-developed and well-nourished and in no acute distress  HEENT:  Head is [place]/AT.  Sclera are anicteric.     Skin: Extremities are without rash.  There is mild ankle edema Musculoskeletal: She has tenderness in the cervical and lumbar paraspinal muscles.  Neurologic Exam  Mental status: The patient is alert and oriented x 3 at the time of the examination. The patient has apparent normal recent and remote memory, with an apparently normal attention span and concentration ability.   Speech is normal.  Cranial nerves: Extraocular movements are full.  Facial strength and sensation was normal.  No obvious hearing deficits are noted.  Motor:  Muscle bulk is normal.   Tone is normal. Strength is  5 / 5 in all 4 extremities except 4+/5 ankles,4/5 toes, slightly worse on left. .   Sensory: She reported reduced sensation to touch and temperature in the left arm and leg.  Vibration sensation was more symmetric.  Decreased sensation to vibration in the toes and ankles compared to the knees  Coordination: Cerebellar testing reveals good finger-nose-finger and heel-to-shin  bilaterally.  Gait and station: Station is normal.   She has an arthritic and wide gait with a reduced stride..  She cannot tandem without support..  Romberg is negative.   Reflexes: Deep tendon reflexes are 1 and  symmetric in the limbs.       DIAGNOSTIC DATA (LABS, IMAGING, TESTING) - I reviewed patient records, labs, notes, testing and imaging myself where available.  Lab Results  Component Value Date   WBC 5.0 03/25/2022   HGB 15.5 03/25/2022   HCT 46.1 03/25/2022   MCV 85 03/25/2022   PLT 308 03/25/2022      Component Value Date/Time   NA 143 03/25/2022 1030   K 4.4 03/25/2022 1030   CL 105 03/25/2022 1030   CO2 21 03/25/2022 1030   GLUCOSE 92 03/25/2022 1030   GLUCOSE 93 08/01/2021 0505   BUN 11 03/25/2022 1030   CREATININE 1.01 (H) 03/25/2022 1030   CREATININE 0.85 02/24/2012 1044   CALCIUM 10.2 03/25/2022 1030   PROT 7.5 03/25/2022 1030   ALBUMIN 4.8 03/25/2022 1030   AST 17 03/25/2022 1030   ALT 16 03/25/2022 1030   ALKPHOS 136 (H) 03/25/2022 1030   BILITOT 0.3 03/25/2022 1030   GFRNONAA 38 (L) 08/01/2021 0505   GFRAA >60 11/17/2019 1242   Lab Results  Component Value Date   CHOL (H) 02/19/2009    237        ATP III CLASSIFICATION:  <200     mg/dL   Desirable  200-239  mg/dL   Borderline High  >=240    mg/dL   High          HDL 52 02/19/2009   LDLCALC (H) 02/19/2009    135        Total Cholesterol/HDL:CHD Risk Coronary Heart Disease Risk Table                     Men   Women  1/2 Average Risk   3.4   3.3  Average Risk       5.0   4.4  2 X Average Risk   9.6   7.1  3 X Average Risk  23.4   11.0        Use the calculated Patient Ratio above and the CHD Risk Table to determine the patient's CHD Risk.        ATP III CLASSIFICATION (LDL):  <100     mg/dL   Optimal  100-129  mg/dL   Near or Above                    Optimal  130-159  mg/dL   Borderline  160-189  mg/dL   High  >190     mg/dL   Very High   TRIG 249 (H) 02/19/2009   CHOLHDL 4.6  02/19/2009   Lab Results  Component Value Date   HGBA1C 5.5 07/06/2021   Lab Results  Component Value Date   VITAMINB12 1,987 (H) 03/25/2022   Lab Results  Component Value Date   TSH 1.080 10/23/2021       ASSESSMENT AND PLAN  Multiple sclerosis ([place]) - Plan: MR CERVICAL SPINE W WO CONTRAST, MR BRAIN W WO CONTRAST  Gait disturbance - Plan: MR CERVICAL SPINE W WO CONTRAST, MR BRAIN W WO CONTRAST  High risk medication use  Other chronic pain   1.    She has noted some more symptoms with her MS.  Unclear if recent exacerbation or changes more due to secondary progressive MS.  We will check an MRI of the brain and cervical spine.  If there are new lesions then I would urge her to switch from Tecfidera to Tysabri.  Because of her  history of diabetic ulcers of the feet I would be reluctant to do Ocrevus or other anti-CD20. 2.    D/C amantadine 3.    Encouraged weight loss.   Advised to eat healthy.  Continue vitamin D. 4.    Use walker instead of cane when more tired.    5.    Return in 6 months or sooner if there are new or worsening neurologic symptoms.    Ebert Forrester A. Felecia Shelling, MD, [facility] [id number], [id number] PM Certified in Neurology, Clinical Neurophysiology, Sleep Medicine and Neuroimaging  [facility] [address] [place], [place] [address] [phone]

## 2022-07-07 ENCOUNTER — Telehealth: Payer: Self-pay | Admitting: Neurology

## 2022-07-07 NOTE — Telephone Encounter (Signed)
[employer] medicare NPR sent to  [facility] for sedation [phone]

## 2022-08-10 ENCOUNTER — Telehealth: Payer: Self-pay | Admitting: Neurology

## 2022-08-10 NOTE — Telephone Encounter (Signed)
Tori, have you received this?

## 2022-08-10 NOTE — Telephone Encounter (Signed)
Pt called stating that she was called and informed by [facility] that they have faxed over a form that is needing to be filled out before she gets the MRI done. Please advise.

## 2022-08-11 NOTE — Telephone Encounter (Signed)
[name], can you call back and confirm first that pt can complete MRI 08/18/22 given last visit more than 30 days ago? Typically they want a visit within 30 days of the MRI.

## 2022-08-12 ENCOUNTER — Telehealth (INDEPENDENT_AMBULATORY_CARE_PROVIDER_SITE_OTHER): Payer: 59 | Admitting: Neurology

## 2022-08-12 ENCOUNTER — Encounter: Payer: Self-pay | Admitting: Neurology

## 2022-08-12 DIAGNOSIS — R251 Tremor, unspecified: Secondary | ICD-10-CM

## 2022-08-12 DIAGNOSIS — R269 Unspecified abnormalities of gait and mobility: Secondary | ICD-10-CM

## 2022-08-12 DIAGNOSIS — G35D Multiple sclerosis, unspecified: Secondary | ICD-10-CM

## 2022-08-12 DIAGNOSIS — R413 Other amnesia: Secondary | ICD-10-CM | POA: Diagnosis not present

## 2022-08-12 DIAGNOSIS — R479 Unspecified speech disturbances: Secondary | ICD-10-CM

## 2022-08-12 DIAGNOSIS — G8929 Other chronic pain: Secondary | ICD-10-CM | POA: Diagnosis not present

## 2022-08-12 DIAGNOSIS — G35 Multiple sclerosis: Secondary | ICD-10-CM

## 2022-08-12 NOTE — Telephone Encounter (Signed)
Received confirmation that pt has to have appt within 30 days of MRI appt (08/18/22). Her last appt is outside this range. Asking MD to see if she can be worked in this week for [employer] VV. Waiting on response.

## 2022-08-12 NOTE — Telephone Encounter (Signed)
Tori-FYI

## 2022-08-12 NOTE — Telephone Encounter (Signed)
Called pt. Scheduled mychart VV today at 4:30p with Dr. Felecia Shelling ( Md approved). Asked her to log on about 4:15p. Form needing completion placed on MD desk for visit.

## 2022-08-12 NOTE — Progress Notes (Addendum)
[facility]  PATIENT: Cynthia MICHAUX DOB: [birth year]  REFERRING DOCTOR OR PCP: Lucky Cowboy, FNP SOURCE: Patient, notes from primary care, imaging and lab reports, MRI images personally reviewed.  _________________________________   HISTORICAL  CHIEF COMPLAINT:  No chief complaint on file.    HISTORY OF PRESENT ILLNESS:  [name] is a 62 y.o. woman with multiple sclerosis, LBP and gait disturbance  Update 02/17/2021: Virtual Visit via Video Note I connected with [name] 08/12/22 at  4:30 PM EDT by a video enabled telemedicine application and verified that I am speaking with the correct person.  I discussed the limitations of evaluation and management by telemedicine and the availability of in person appointments. The patient expressed understanding and agreed to proceed.   Due to a bad video connection we finished the visit by phone  Patient at home and provider in office  History of Present Illness: She is on Ocrevus  and tolerates it well.Marland Kitchen  Her first infusion was August 2023 and last infusion was 07/01/2022.  She has tolerated the infusions well.  The right arm shakes at times starting 2 months ago.   She also has noted more difficulties with her speech.  She is having more trouble coming up with the right words and stuttering some.  Due to these new symptoms we need to check MRI of the brain and cervical spine.  She is very claustrophobic and this needs to be done under general anesthesia at [facility].     She reports more difficulty with her vision.   The left eye is  blurry on its own but she notes diplopia with both eyes open..   .  She notes more tremor.  She feels gait is worse and stumbles some.       She feels her legs are more wobbly.   She uses the walker at times but the cane more.    With her cane she can go 100 feet most days.  She does not use the walker much.  The left leg buckles and she fell walking backwards.     She is  having some difficulty doing activities of daily living.   We tried  dalfampridine but it did not help.  Amantadine has not helped      She continues to note burning dysesthetic pain in the arms and legs.  Sometimes her foot is red.     Besides MS she has NIDDM (was n insulin, now on Mounjaro).  She is on lamotrigine as well for pain.   Gabapentin had not helped and she gained weight on it .  Her vision is doing about the same.  She has urinary urgency and frequency.   Mood is doing well and she denies depression or anxiety.    She is on Cymbalta 60 mg po qd and lamotrigine 200 mg po bid.     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  She is on eszopiclone   She sees [facility] for [facility] Management and other controlled substances.       She also has LBP and sees Dr. Christoper Fabian for pain management and is on  oxycodone 15 mg q6  She had MBBB/RFA helped the right side but not the left side.  Surgery has been discussed with her.   She is also on tizanidine, baclofen and amitriptyline to help her pain.   She sees Dr. Maia Petties.  Observations/Objective:  Vitals 07/06/2022  BP 133/81, HR93 She is a well-developed well-nourished woman in no acute distress.  The head is normocephalic and atraumatic.  Sclera are anicteric.  Visible skin appears normal.  The neck has a good range of motion.   She is alert and fully oriented with fluent speech and good attention, knowledge and memory.  Extraocular muscles are intact.  Facial strength is normal.    She appears to have normal strength in the arms.  Rapid alternating movements and finger-nose-finger are performed well.   MS HISTORY She was diagnosed with MS in 2008 after presenting with leg weakness and headache, neck pain, back pain.  MRIs showed foci in her cervical spine and brain.   She was started on Rebif.   Initially she did well but she reports having a lot of relapses.   She was switched to Copaxone but also had more  symptoms.   She was then switched to Tysabri for several years but reports having several relapses.   She switched to Tecfidera 5 years ago.   She reports having a flare in 2015 and had 5 days of IV Solu-medrol.   July 2021, she was complaining of blurry vision, stiff neck and increased burning and tingling a few weeks ago.  She went to the ED and had MRIs performed showing no new lesions.  She switched to [facility] August 2023  IMAGING: MRI of the brain, cervical spine and thoracic spine 07/07/2021 showed no new lesions.  At her spinal cord she has plaques at C2, C3-C4 and T6.  MRI of the cervical spine 11/18/2019 and 03/02/2012 showed two T2 hyperintense foci adjacent to C2 and C3-C4.  There was mild multilevel degenerative changes as well.    MRI of the brain 11/18/2019 and 03/02/2020 show a fairly low plaque burden of T2/FLAIR hyperintense foci.  Most of them are nonspecific.  A few are periventricular.  None acute and no change over time.  REVIEW OF SYSTEMS: Constitutional: No fevers, chills, sweats, or change in appetite.  She has fatigue and she reports insomnia Eyes: No visual changes, double vision, eye pain Ear, nose and throat: No hearing loss, ear pain, nasal congestion, sore throat Cardiovascular: No chest pain, palpitations Respiratory:  No shortness of breath at rest or with exertion.   No wheezes GastrointestinaI: No nausea, vomiting, diarrhea, abdominal pain, fecal incontinence Genitourinary:  No dysuria, urinary retention or frequency.  No nocturia. Musculoskeletal: Pain all over in muscles, joints and spine Integumentary: No rash, pruritus, skin lesions Neurological: as above Psychiatric: She has depression and anxiety Endocrine: No palpitations, diaphoresis, change in appetite, change in weigh or increased thirst Hematologic/Lymphatic:  No anemia, purpura, petechiae. Allergic/Immunologic: No itchy/runny eyes, nasal congestion, recent allergic reactions, rashes  ALLERGIES: No  Known Allergies  HOME MEDICATIONS:  Current Outpatient Medications:    Accu-Chek Softclix Lancets lancets, Use as directed up to 4 times daily (Patient not taking: Reported on 07/06/2022), Disp: 100 each, Rfl: 5   amantadine (SYMMETREL) 100 MG capsule, Take 1 capsule (100 mg total) by mouth 2 (two) times daily., Disp: 60 capsule, Rfl: 11   amitriptyline (ELAVIL) 25 MG tablet, TAKE 1 TABLET(25 MG) BY MOUTH AT BEDTIME, Disp: 30 tablet, Rfl: 5   amLODipine (NORVASC) 10 MG tablet, Take 1 tablet (10 mg total) by mouth daily. (Patient taking differently: Take 10 mg by mouth at bedtime.), Disp: , Rfl:    atorvastatin (LIPITOR) 80 MG tablet, Take 1 tablet (80 mg total) by mouth daily. (  Patient taking differently: Take 80 mg by mouth every evening.), Disp: 90 tablet, Rfl: 3   baclofen (LIORESAL) 20 MG tablet, Take 20 mg by mouth 2 (two) times daily., Disp: , Rfl:    Biotin w/ Vitamins C & E (HAIR/SKIN/NAILS PO), Take 3 tablets by mouth daily., Disp: , Rfl:    Blood Glucose Monitoring Suppl (BLOOD GLUCOSE MONITOR SYSTEM) w/Device KIT, use as directed (Patient not taking: Reported on 03/25/2022), Disp: 1 kit, Rfl: 0   Cholecalciferol (VITAMIN D-3) 125 MCG (5000 UT) TABS, Take 5,000 Units by mouth in the morning., Disp: , Rfl:    collagenase (SANTYL) 250 UNIT/GM ointment, Apply 1 Application topically daily., Disp: 15 g, Rfl: 0   dicyclomine (BENTYL) 20 MG tablet, Take 20 mg by mouth 2 (two) times daily., Disp: , Rfl:    diphenoxylate-atropine (LOMOTIL) 2.5-0.025 MG tablet, Take 3 tablets by mouth 2 (two) times daily., Disp: , Rfl:    doxepin (SINEQUAN) 10 MG capsule, Take 10 mg by mouth at bedtime., Disp: , Rfl:    doxycycline (VIBRA-TABS) 100 MG tablet, Take 1 tablet (100 mg total) by mouth 2 (two) times daily., Disp: 60 tablet, Rfl: 0   DULoxetine (CYMBALTA) 30 MG capsule, 30 mg 2 (two) times daily. (Patient not taking: Reported on 07/06/2022), Disp: , Rfl:    DULoxetine (CYMBALTA) 60 MG capsule, Take 60 mg  by mouth 2 (two) times daily., Disp: , Rfl:    Eszopiclone (LUNESTA PO), Take 1 tablet by mouth at bedtime., Disp: , Rfl:    famotidine (PEPCID) 20 MG tablet, Take 20 mg by mouth 2 (two) times daily., Disp: , Rfl:    fluticasone (FLONASE) 50 MCG/ACT nasal spray, Place 2 sprays into the nose daily. (Patient taking differently: Place 2 sprays into the nose in the morning and at bedtime.), Disp: 16 g, Rfl: 11   gabapentin (NEURONTIN) 100 MG capsule, Take 100 mg by mouth 3 (three) times daily as needed., Disp: , Rfl:    glucose blood (ACCU-CHEK GUIDE) test strip, Use as instructed up to 4 times daily (Patient not taking: Reported on 03/25/2022), Disp: 100 each, Rfl: 12   Homeopathic Products (LEG CRAMPS PO), Place 3 tablets under the tongue 2 (two) times daily. 3 am tablets and 3 pm tablets., Disp: , Rfl:    hydrALAZINE (APRESOLINE) 50 MG tablet, Take 1 tablet (50 mg total) by mouth every 8 (eight) hours., Disp: , Rfl:    ibuprofen (ADVIL) 200 MG tablet, Take 400 mg by mouth every 6 (six) hours as needed., Disp: , Rfl:    ibuprofen (ADVIL) 800 MG tablet, Take 800 mg by mouth every 8 (eight) hours as needed., Disp: , Rfl:    insulin glargine (LANTUS) 100 UNIT/ML Solostar Pen, Inject 40 Units into the skin daily. (Patient not taking: Reported on 03/25/2022), Disp: 15 mL, Rfl: 2   insulin lispro (HUMALOG) 100 UNIT/ML KwikPen, Inject 10 Units into the skin 3 (three) times daily with meals. (Patient not taking: Reported on 03/25/2022), Disp: 15 mL, Rfl: 2   Insulin Pen Needle 32G X 4 MM MISC, use as directed 4 times daily (Patient not taking: Reported on 03/25/2022), Disp: 200 each, Rfl: 2   lamoTRIgine (LAMICTAL) 200 MG tablet, TAKE 1 TABLET BY MOUTH TWICE DAILY, Disp: 60 tablet, Rfl: 5   lansoprazole (PREVACID) 30 MG capsule, Take 30 mg by mouth 2 (two) times daily before a meal., Disp: , Rfl:    loperamide (IMODIUM) 2 MG capsule, Take 1 capsule (2 mg  total) by mouth 2 (two) times daily., Disp: 30 capsule, Rfl:  0   losartan (COZAAR) 100 MG tablet, Take 1 tablet (100 mg total) by mouth daily. (Patient taking differently: Take 100 mg by mouth at bedtime.), Disp: 90 tablet, Rfl: 3   losartan (COZAAR) 50 MG tablet, Take 100 mg by mouth daily. (Patient not taking: Reported on 07/06/2022), Disp: , Rfl:    MAGNESIUM-OXIDE 400 (240 Mg) MG tablet, Take 1 tablet by mouth daily., Disp: , Rfl:    MELATONIN MAXIMUM STRENGTH 5 MG TABS, Take 5 mg by mouth at bedtime., Disp: , Rfl:    montelukast (SINGULAIR) 10 MG tablet, Take 10 mg by mouth at bedtime., Disp: , Rfl:    MOUNJARO 2.5 MG/0.5ML Pen, SMARTSIG:2.5 Milligram(s) SUB-Q Once a Week, Disp: , Rfl:    NARCAN 4 MG/0.1ML LIQD nasal spray kit, Place 1 spray into the nose once as needed (overdose)., Disp: , Rfl:    Oxcarbazepine (TRILEPTAL) 300 MG tablet, TAKE 1 TABLET(300 MG) BY MOUTH TWICE DAILY, Disp: 60 tablet, Rfl: 5   oxyCODONE (ROXICODONE) 15 MG immediate release tablet, Take 15 mg by mouth in the morning, at noon, in the evening, and at bedtime. May take up to a total of 6 times per day if needed, Disp: , Rfl:    OZEMPIC, 0.25 OR 0.5 MG/DOSE, 2 MG/1.5ML SOPN, Inject into the skin once a week. Tuesdays (Patient not taking: Reported on 03/25/2022), Disp: , Rfl:    pantoprazole (PROTONIX) 40 MG tablet, 40 mg 2 (two) times daily., Disp: , Rfl:    sulfamethoxazole-trimethoprim (BACTRIM DS) 800-160 MG tablet, Take 1 tablet by mouth 2 (two) times daily. (Patient not taking: Reported on 07/06/2022), Disp: 60 tablet, Rfl: 0   sulfamethoxazole-trimethoprim (BACTRIM DS) 800-160 MG tablet, Take 1 tablet by mouth 2 (two) times daily., Disp: 28 tablet, Rfl: 0   tiZANidine (ZANAFLEX) 2 MG tablet, Take 2 mg by mouth as needed., Disp: , Rfl:    triamterene-hydrochlorothiazide (MAXZIDE-25) 37.5-25 MG tablet, Take 1 tablet by mouth every morning., Disp: , Rfl:    VIBERZI 100 MG TABS, Take 100 mg by mouth 2 (two) times daily., Disp: , Rfl:    vitamin B-12 (CYANOCOBALAMIN) 1000 MCG  tablet, Take 1,000 mcg by mouth in the morning., Disp: , Rfl:    XIIDRA 5 % SOLN, Place 2 drops into both eyes in the morning and at bedtime., Disp: , Rfl:   PAST MEDICAL HISTORY: Past Medical History:  Diagnosis Date   Acute cystitis    Acute metabolic encephalopathy    Anemia    Arthritis    Chronic pain    Diabetes mellitus    Fibromyalgia    GERD (gastroesophageal reflux disease)    Hepatitis C    received treatment   History of methicillin resistant staphylococcus aureus (MRSA)    Hypercholesteremia    Hypertension    IBS (irritable bowel syndrome)    Multiple sclerosis exacerbation (HCC)    Neurogenic bladder    pt denies having to cath herself-says she voids on her own as of 11-21-21   Neuropathy    Obesity    Sacral nerve stimulator present    battery is dead and has not worked in years   Stroke [facility])    pt denies this but pcp has it in their history   Tachycardia    UTI (urinary tract infection)    Vitamin D deficiency     PAST SURGICAL HISTORY: Past Surgical History:  Procedure Laterality  Date   ABDOMINAL HYSTERECTOMY     partial   I & D EXTREMITY Right 12/14/2020   Procedure: IRRIGATION AND DEBRIDEMENT RIGHT LOWER EXTREMITY;  Surgeon: Rod Can, MD;  Location: [place];  Service: Orthopedics;  Laterality: Right;   JOINT REPLACEMENT     LTK   ORIF ANKLE FRACTURE Left 11/25/2021   Procedure: OPEN REDUCTION INTERNAL FIXATION (ORIF) LEFT ANKLE FRACTURE;  Surgeon: Felipa Furnace, DPM;  Location: [facility];  Service: Orthopedics;  Laterality: Left;  POPLITEAL BLOCK   RADIOLOGY WITH ANESTHESIA N/A 02/18/2021   Procedure: MRI WITH ANESTHESIA CERVICAL SPINE WITH AND WITHOUT CONTRAST AND BRAIN WITH AND WITHOUT CONTRAST;  Surgeon: Radiologist, Medication, MD;  Location: [place];  Service: Radiology;  Laterality: N/A;   RADIOLOGY WITH ANESTHESIA N/A 07/07/2021   Procedure: MRI WITH ANESTHESIA;  Surgeon: Radiologist, Medication, MD;  Location: [place];  Service: Radiology;   Laterality: N/A;   SACRAL NERVE STIMULATOR PLACEMENT     [employer]   SYNDESMOSIS REPAIR Left 11/25/2021   Procedure: SYNDESMOSIS REPAIR;  Surgeon: Felipa Furnace, DPM;  Location: [facility];  Service: Orthopedics;  Laterality: Left;   TUBAL LIGATION      FAMILY HISTORY: Family History  Problem Relation Age of Onset   Diabetes Mother    Hypertension Mother    Stroke Father        DIAGNOSTIC DATA (LABS, IMAGING, TESTING) - I reviewed patient records, labs, notes, testing and imaging myself where available.  Lab Results  Component Value Date   WBC 5.0 03/25/2022   HGB 15.5 03/25/2022   HCT 46.1 03/25/2022   MCV 85 03/25/2022   PLT 308 03/25/2022      Component Value Date/Time   NA 143 03/25/2022 1030   K 4.4 03/25/2022 1030   CL 105 03/25/2022 1030   CO2 21 03/25/2022 1030   GLUCOSE 92 03/25/2022 1030   GLUCOSE 93 08/01/2021 0505   BUN 11 03/25/2022 1030   CREATININE 1.01 (H) 03/25/2022 1030   CREATININE 0.85 02/24/2012 1044   CALCIUM 10.2 03/25/2022 1030   PROT 7.5 03/25/2022 1030   ALBUMIN 4.8 03/25/2022 1030   AST 17 03/25/2022 1030   ALT 16 03/25/2022 1030   ALKPHOS 136 (H) 03/25/2022 1030   BILITOT 0.3 03/25/2022 1030   GFRNONAA 38 (L) 08/01/2021 0505   GFRAA >60 11/17/2019 1242   Lab Results  Component Value Date   CHOL (H) 02/19/2009    237        ATP III CLASSIFICATION:  <200     mg/dL   Desirable  200-239  mg/dL   Borderline High  >=240    mg/dL   High          HDL 52 02/19/2009   LDLCALC (H) 02/19/2009    135        Total Cholesterol/HDL:CHD Risk Coronary Heart Disease Risk Table                     Men   Women  1/2 Average Risk   3.4   3.3  Average Risk       5.0   4.4  2 X Average Risk   9.6   7.1  3 X Average Risk  23.4   11.0        Use the calculated Patient Ratio above and the CHD Risk Table to determine the patient's CHD Risk.        ATP III CLASSIFICATION (LDL):  <100  mg/dL   Optimal  100-129  mg/dL   Near or Above                     Optimal  130-159  mg/dL   Borderline  160-189  mg/dL   High  >190     mg/dL   Very High   TRIG 249 (H) 02/19/2009   CHOLHDL 4.6 02/19/2009   Lab Results  Component Value Date   HGBA1C 5.5 07/06/2021   Lab Results  Component Value Date   VITAMINB12 1,987 (H) 03/25/2022   Lab Results  Component Value Date   TSH 1.080 10/23/2021       ASSESSMENT AND PLAN  Multiple sclerosis (HCC)  Gait disturbance  Other chronic pain  Complaints of memory disturbance  Speech disturbance, unspecified type  Tremor   1.    Continue Ocrevus.  Due to worsening symptoms, we will check an MRI of the brain and cervical spine.  This is going to be set up in the hospital since she requires sedation.   2.    Encouraged weight loss.  She tries to be active and should try to increase his activity further.   4.    Return in 6 months or sooner if there are new or worsening neurologic symptoms.   Follow Up Instructions: I discussed the assessment and treatment plan with the patient. The patient was provided an opportunity to ask questions and all were answered. The patient agreed with the plan and demonstrated an understanding of the instructions.    The patient was advised to call back or seek an in-person evaluation if the symptoms worsen or if the condition fails to improve as anticipated.  I provided 18 minutes of non-face-to-face time during this encounter.  This visit is part of a comprehensive longitudinal care medical relationship regarding the patients primary diagnosis of MS and related concerns.   Stefana Lodico A. Felecia Shelling, MD, [facility] [id number], [id number] PM Certified in Neurology, Clinical Neurophysiology, Sleep Medicine and Neuroimaging  [facility] [address] [place], [place] [address] [phone]

## 2022-08-12 NOTE — Telephone Encounter (Signed)
Faxed completed/signed H&P form and office notes from 08/12/22 to [facility] at [phone]. Received fax confirmation.

## 2022-08-14 ENCOUNTER — Encounter (HOSPITAL_COMMUNITY): Payer: Self-pay

## 2022-08-14 ENCOUNTER — Other Ambulatory Visit: Payer: Self-pay

## 2022-08-14 NOTE — Progress Notes (Addendum)
SDW call  PCP - Lucky Cowboy, FNP Neurologist: Dr. Arlice Colt   PPM/ICD - Patient has a Sacral Nerve Stimulator.  Requested she bring remote day of procedure. States the battery has not worked in her stimulator for years. HX of multiple previous MRI's with this stimulator in place, no issues reported.  Device Orders -  Rep Notified -    Chest x-ray - 07/06/2021 EKG -  DOS, 08/18/2022 Stress Test - 06/16/2010 ECHO - 07/07/2021 Cardiac Cath -   Sleep Study/sleep apnea/CPAP  Type II Diabetic Fasting Blood sugar range 70-90 How often check sugars: about once per month  GLP-1:  On Mounjaro.  Stop 7 days prior to procedure.  Last dose patient took was August 05, 2022   Blood Thinner Instructions: Denies Aspirin Instructions: Denies   ERAS Protcol - Yes, clear liquids 3 hours prior to procedure. Last po fluids 0700 PRE-SURGERY Ensure or G2- No   COVID TEST- n/a   Anesthesia review: Yes, HTN, DM, MS, stroke, Metabolic encephalopathy, neurogenic bladder, Hx tachycardia, Has sacral nerve stimulator   Patient denies shortness of breath, fever, cough and chest pain over the phone call    Your procedure is scheduled on Tuesday August 18, 2022  Report to [place] "A" at  0730  A.M., then check in with the Admitting office.  Call this number if you have problems the morning of surgery:  [phone]   If you have any questions prior to your surgery date call [phone]: Open Monday-Friday 8am-4pm If you experience any cold or flu symptoms such as cough, fever, chills, shortness of breath, etc. between now and your scheduled surgery, please notify us at the above number     Remember:  Do not eat after midnight the night before your surgery  You may drink clear liquids until  0700   the morning of your surgery.   Clear liquids allowed are: Water, Non-Citrus Juices (without pulp), Carbonated Beverages, Clear Tea, Black Coffee ONLY (NO MILK, CREAM OR POWDERED CREAMER  of any kind), and Gatorade   Take these medicines the morning of surgery with A SIP OF WATER:  Amantadine, bacofen, bentyl, cymbalta, pepcid, flonase, gabapentin, lamictal, prevacid, imodium, lomotil, trileptal, protonix, zanaflex, viberzi  As needed: Oxycodone, xiidra eye drops  As of today, STOP taking any Aspirin (unless otherwise instructed by your surgeon) Aleve, Naproxen, Ibuprofen, Motrin, Advil, Goody's, BC's, all herbal medications, fish oil, and all vitamins.

## 2022-08-17 NOTE — Anesthesia Preprocedure Evaluation (Signed)
Anesthesia Evaluation  Patient identified by MRN, date of birth, ID band Patient awake    Reviewed: Allergy & Precautions, NPO status , Patient's Chart, lab work & pertinent test results  Airway Mallampati: II  TM Distance: >3 FB Neck ROM: Full    Dental  (+) Chipped,    Pulmonary former smoker   breath sounds clear to auscultation       Cardiovascular hypertension, Pt. on medications  Rhythm:Regular Rate:Normal     Neuro/Psych  PSYCHIATRIC DISORDERS Anxiety Depression    CVA    GI/Hepatic ,GERD  Medicated,,(+) Hepatitis -, C  Endo/Other  diabetes, Type 2, Insulin Dependent    Renal/GU negative Renal ROS     Musculoskeletal  (+) Arthritis ,  Fibromyalgia -  Abdominal   Peds  Hematology   Anesthesia Other Findings   Reproductive/Obstetrics                             Anesthesia Physical Anesthesia Plan  ASA: 3  Anesthesia Plan: General   Post-op Pain Management: Minimal or no pain anticipated   Induction: Intravenous  PONV Risk Score and Plan: 3 and Midazolam, Ondansetron and Treatment may vary due to age or medical condition  Airway Management Planned: LMA  Additional Equipment: None  Intra-op Plan:   Post-operative Plan: Extubation in OR  Informed Consent: I have reviewed the patients History and Physical, chart, labs and discussed the procedure including the risks, benefits and alternatives for the proposed anesthesia with the patient or authorized representative who has indicated his/her understanding and acceptance.     Dental advisory given  Plan Discussed with: CRNA  Anesthesia Plan Comments: (PAT note written 08/17/2022 by Myra Gianotti, PA-C.  )       Anesthesia Quick Evaluation

## 2022-08-17 NOTE — Progress Notes (Signed)
Anesthesia Chart Review: [name]  Case: [id number] Date/Time: 08/18/22 0945   Procedure: MRI BRAIN WITH AND WITHOUT CONTRAST; MRI CERVICAL SPINE WITHE AND WITOUT CONTRAST WITH ANESTHESIA   Anesthesia type: General   Pre-op diagnosis: MULTIPLE SCLEROSIS   Location: [place] / [place]   Surgeons: Radiologist, Medication, MD       DISCUSSION: Patient is a 62 year old female scheduled for the above procedure. Progress Note/H&P per Dr. Felecia Shelling on 08/12/22 (form completed, signed and scanned under Media Tab, Radiology Order). Brain MRI ordered due to worsening MS symptoms. Continue Ocrevus.   History includes former smoker (quit 03/18/21), multiple sclerosis (diagnosed in 2008), neuropathy, neurogenic bladder, HTN, DM2 (hyperosmolar hyperglycemic syndrome 11/2020), hypercholesterolemia, IBS, fibromyalgia, GERD, hepatitis C (s/p treatment), sacral nerve stimulator (reported non-functions since battery end of life; reported prior MRI without issue), left ankle fracture (ORIF 11/25/21), morbid obesity.     Labs as indicated and anesthesia team evaluation on the day of procedure. She reported last Mounjaro was 08/05/22 and instructed to hold for 7 days prior to procedure.     VS: Ht 5\' 3"  (1.6 m)   Wt 115.7 kg   BMI 45.17 kg/m  BP Readings from Last 3 Encounters:  07/06/22 133/81  05/20/22 130/78  03/25/22 (!) 169/108   Pulse Readings from Last 3 Encounters:  07/06/22 93  03/25/22 100  11/25/21 94     PROVIDERS: Beverley Fiedler, FNP is PCP  Arlice Colt, MD is neurologist - Last cardiology visit with Loralie Champagne, MD 07/16/10. Had a preoperative evaluation for dyspnea and HTN. Testing done (see below). As needed follow-up recommended.   LABS: For updated labs on the day of procedure as indicated.  Last lab results in [facility] include: Lab Results  Component Value Date   WBC 5.0 03/25/2022   HGB 15.5 03/25/2022   HCT 46.1 03/25/2022   PLT 308 03/25/2022   GLUCOSE 92  03/25/2022   ALT 16 03/25/2022   AST 17 03/25/2022   NA 143 03/25/2022   K 4.4 03/25/2022   CL 105 03/25/2022   CREATININE 1.01 (H) 03/25/2022   BUN 11 03/25/2022   CO2 21 03/25/2022   TSH 1.080 10/23/2021   HGBA1C 5.5 07/06/2021    IMAGES: CTA Head/Neck 06/29/21: IMPRESSION: 1. Negative CTA of the head and neck. No large vessel occlusion or other acute vascular abnormality. 2. 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 3. No other acute intracranial abnormality.  MRI Brain 07/07/21: MPRESSION: Unchanged cerebral white matter disease consistent with the history of multiple sclerosis. No evidence of active demyelination or other acute intracranial abnormality.  MRI C-spine 07/07/21: IMPRESSION: 1. Faint chronic signal abnormality in the upper cervical spinal cord consistent with the history of multiple sclerosis. No convincing new lesions or evidence of active demyelination. 2. Unchanged cervical disc and facet degeneration resulting in mild spinal stenosis from C4-5 to C6-7. 3. Moderate to severe multilevel neural foraminal stenosis as above.    EKG: Last EKG noted is > 1 year ago. 07/06/21: SR.   CV: Echo 07/07/21: IMPRESSIONS   1. Left ventricular ejection fraction, by estimation, is 60 to 65%. The  left ventricle has normal function. The left ventricle has no regional  wall motion abnormalities. There is mild left ventricular hypertrophy.  Left ventricular diastolic parameters  were normal.   2. Right ventricular systolic function is normal. The right ventricular  size is normal.   3. The mitral valve is  normal in structure. No evidence of mitral valve  regurgitation.   4. The aortic valve is tricuspid. Aortic valve regurgitation is not  visualized. No aortic stenosis is present.    Nuclear stress test 06/16/10: ECG Impression: No significant ST segment change suggestive of ischemia. Overall  Impression: Normal stress nuclear study. Appended Document: Cardiology Nuclear Testing probably normal study     48 hour Holter monitor 06/13/10: Occasional PVCs, otherwise okay     Past Medical History:  Diagnosis Date   Acute cystitis    Acute metabolic encephalopathy    Anemia    Arthritis    Chronic pain    Diabetes mellitus    Fibromyalgia    GERD (gastroesophageal reflux disease)    Hepatitis C    received treatment   History of methicillin resistant staphylococcus aureus (MRSA)    Hypercholesteremia    Hypertension    IBS (irritable bowel syndrome)    Multiple sclerosis exacerbation (HCC)    Neurogenic bladder    pt denies having to cath herself-says she voids on her own as of 11-21-21   Neuropathy    Obesity    Sacral nerve stimulator present    battery is dead and has not worked in years   Stroke [facility])    pt denies this but pcp has it in their history   Tachycardia    UTI (urinary tract infection)    Vitamin D deficiency     Past Surgical History:  Procedure Laterality Date   ABDOMINAL HYSTERECTOMY     partial   I & D EXTREMITY Right 12/14/2020   Procedure: IRRIGATION AND DEBRIDEMENT RIGHT LOWER EXTREMITY;  Surgeon: Rod Can, MD;  Location: [place];  Service: Orthopedics;  Laterality: Right;   JOINT REPLACEMENT     LTK   ORIF ANKLE FRACTURE Left 11/25/2021   Procedure: OPEN REDUCTION INTERNAL FIXATION (ORIF) LEFT ANKLE FRACTURE;  Surgeon: Felipa Furnace, DPM;  Location: [facility];  Service: Orthopedics;  Laterality: Left;  POPLITEAL BLOCK   RADIOLOGY WITH ANESTHESIA N/A 02/18/2021   Procedure: MRI WITH ANESTHESIA CERVICAL SPINE WITH AND WITHOUT CONTRAST AND BRAIN WITH AND WITHOUT CONTRAST;  Surgeon: Radiologist, Medication, MD;  Location: [place];  Service: Radiology;  Laterality: N/A;   RADIOLOGY WITH ANESTHESIA N/A 07/07/2021   Procedure: MRI WITH ANESTHESIA;  Surgeon: Radiologist, Medication, MD;  Location: [place];  Service: Radiology;  Laterality: N/A;    SACRAL NERVE STIMULATOR PLACEMENT     [employer]   SYNDESMOSIS REPAIR Left 11/25/2021   Procedure: SYNDESMOSIS REPAIR;  Surgeon: Felipa Furnace, DPM;  Location: [facility];  Service: Orthopedics;  Laterality: Left;   TUBAL LIGATION      MEDICATIONS: No current facility-administered medications for this encounter.    amantadine (SYMMETREL) 100 MG capsule   amitriptyline (ELAVIL) 25 MG tablet   atorvastatin (LIPITOR) 80 MG tablet   baclofen (LIORESAL) 20 MG tablet   Cholecalciferol (VITAMIN D-3) 125 MCG (5000 UT) TABS   dicyclomine (BENTYL) 20 MG tablet   diphenoxylate-atropine (LOMOTIL) 2.5-0.025 MG tablet   DULoxetine (CYMBALTA) 60 MG capsule   eszopiclone (LUNESTA) 1 MG TABS tablet   famotidine (PEPCID) 40 MG tablet   fluticasone (FLONASE) 50 MCG/ACT nasal spray   gabapentin (NEURONTIN) 100 MG capsule   Homeopathic Products (LEG CRAMPS PO)   ibuprofen (ADVIL) 800 MG tablet   lamoTRIgine (LAMICTAL) 200 MG tablet   lansoprazole (PREVACID) 30 MG capsule   loperamide (IMODIUM) 2 MG capsule   losartan (COZAAR) 100 MG tablet  MAGNESIUM-OXIDE 400 (240 Mg) MG tablet   MELATONIN MAXIMUM STRENGTH 5 MG TABS   montelukast (SINGULAIR) 10 MG tablet   NARCAN 4 MG/0.1ML LIQD nasal spray kit   nystatin cream (MYCOSTATIN)   Oxcarbazepine (TRILEPTAL) 300 MG tablet   oxyCODONE (ROXICODONE) 15 MG immediate release tablet   pantoprazole (PROTONIX) 40 MG tablet   tirzepatide (MOUNJARO) 7.5 MG/0.5ML Pen   tiZANidine (ZANAFLEX) 2 MG tablet   VIBERZI 100 MG TABS   vitamin B-12 (CYANOCOBALAMIN) 500 MCG tablet   XIIDRA 5 % SOLN   Accu-Chek Softclix Lancets lancets   amLODipine (NORVASC) 10 MG tablet   Blood Glucose Monitoring Suppl (BLOOD GLUCOSE MONITOR SYSTEM) w/Device KIT   collagenase (SANTYL) 250 UNIT/GM ointment   glucose blood (ACCU-CHEK GUIDE) test strip   Insulin Pen Needle 32G X 4 MM MISC    Myra Gianotti, PA-C Surgical Short Stay/Anesthesiology [facility] Phone [phone] [facility] Phone  [phone] 08/17/2022 2:30 PM

## 2022-08-18 ENCOUNTER — Ambulatory Visit (HOSPITAL_BASED_OUTPATIENT_CLINIC_OR_DEPARTMENT_OTHER): Payer: 59 | Admitting: Vascular Surgery

## 2022-08-18 ENCOUNTER — Encounter (HOSPITAL_COMMUNITY)
Admission: RE | Disposition: A | Discharge status: Home / Self Care | Payer: Self-pay | Source: Home / Self Care | Attending: *Deleted

## 2022-08-18 ENCOUNTER — Ambulatory Visit (HOSPITAL_COMMUNITY): Payer: 59 | Admitting: Vascular Surgery

## 2022-08-18 ENCOUNTER — Ambulatory Visit (HOSPITAL_COMMUNITY)
Admission: RE | Admit: 2022-08-18 | Discharge: 2022-08-18 | Disposition: A | Discharge status: Home / Self Care | Payer: 59 | Source: Ambulatory Visit | Attending: Neurology | Admitting: Neurology

## 2022-08-18 ENCOUNTER — Other Ambulatory Visit: Payer: Self-pay

## 2022-08-18 ENCOUNTER — Ambulatory Visit (HOSPITAL_COMMUNITY)
Admission: RE | Admit: 2022-08-18 | Discharge: 2022-08-18 | Disposition: A | Discharge status: Home / Self Care | Payer: 59 | Attending: Neurology | Admitting: Neurology

## 2022-08-18 ENCOUNTER — Encounter (HOSPITAL_COMMUNITY): Payer: Self-pay | Admitting: *Deleted

## 2022-08-18 DIAGNOSIS — G35 Multiple sclerosis: Secondary | ICD-10-CM

## 2022-08-18 DIAGNOSIS — M4802 Spinal stenosis, cervical region: Secondary | ICD-10-CM | POA: Insufficient documentation

## 2022-08-18 DIAGNOSIS — I10 Essential (primary) hypertension: Secondary | ICD-10-CM

## 2022-08-18 DIAGNOSIS — Z794 Long term (current) use of insulin: Secondary | ICD-10-CM | POA: Diagnosis not present

## 2022-08-18 DIAGNOSIS — Z79899 Other long term (current) drug therapy: Secondary | ICD-10-CM | POA: Diagnosis not present

## 2022-08-18 DIAGNOSIS — Z1159 Encounter for screening for other viral diseases: Secondary | ICD-10-CM

## 2022-08-18 DIAGNOSIS — M797 Fibromyalgia: Secondary | ICD-10-CM | POA: Insufficient documentation

## 2022-08-18 DIAGNOSIS — M4312 Spondylolisthesis, cervical region: Secondary | ICD-10-CM | POA: Insufficient documentation

## 2022-08-18 DIAGNOSIS — Z87891 Personal history of nicotine dependence: Secondary | ICD-10-CM

## 2022-08-18 DIAGNOSIS — M1990 Unspecified osteoarthritis, unspecified site: Secondary | ICD-10-CM | POA: Insufficient documentation

## 2022-08-18 DIAGNOSIS — K219 Gastro-esophageal reflux disease without esophagitis: Secondary | ICD-10-CM | POA: Diagnosis not present

## 2022-08-18 DIAGNOSIS — E119 Type 2 diabetes mellitus without complications: Secondary | ICD-10-CM | POA: Insufficient documentation

## 2022-08-18 DIAGNOSIS — F32A Depression, unspecified: Secondary | ICD-10-CM | POA: Diagnosis not present

## 2022-08-18 DIAGNOSIS — F419 Anxiety disorder, unspecified: Secondary | ICD-10-CM | POA: Insufficient documentation

## 2022-08-18 DIAGNOSIS — R269 Unspecified abnormalities of gait and mobility: Secondary | ICD-10-CM

## 2022-08-18 DIAGNOSIS — B1920 Unspecified viral hepatitis C without hepatic coma: Secondary | ICD-10-CM | POA: Insufficient documentation

## 2022-08-18 DIAGNOSIS — K759 Inflammatory liver disease, unspecified: Secondary | ICD-10-CM

## 2022-08-18 DIAGNOSIS — Z01818 Encounter for other preprocedural examination: Secondary | ICD-10-CM

## 2022-08-18 HISTORY — PX: RADIOLOGY WITH ANESTHESIA: SHX6223

## 2022-08-18 LAB — POCT I-STAT, CHEM 8
BUN: 10 mg/dL (ref 8–23)
Calcium, Ion: 1.19 mmol/L (ref 1.15–1.40)
Chloride: 104 mmol/L (ref 98–111)
Creatinine, Ser: 0.7 mg/dL (ref 0.44–1.00)
Glucose, Bld: 120 mg/dL — ABNORMAL HIGH (ref 70–99)
HCT: 41 % (ref 36.0–46.0)
Hemoglobin: 13.9 g/dL (ref 12.0–15.0)
Potassium: 3.7 mmol/L (ref 3.5–5.1)
Sodium: 140 mmol/L (ref 135–145)
TCO2: 27 mmol/L (ref 22–32)

## 2022-08-18 LAB — SURGICAL PCR SCREEN
MRSA, PCR: NEGATIVE
Staphylococcus aureus: NEGATIVE

## 2022-08-18 LAB — GLUCOSE, CAPILLARY
Glucose-Capillary: 120 mg/dL — ABNORMAL HIGH (ref 70–99)
Glucose-Capillary: 115 mg/dL — ABNORMAL HIGH (ref 70–99)

## 2022-08-18 SURGERY — MRI WITH ANESTHESIA
Anesthesia: General

## 2022-08-18 MED ORDER — SUGAMMADEX SODIUM 200 MG/2ML IV SOLN
INTRAVENOUS | Status: DC | PRN
  Administered 2022-08-18: 200 mg via INTRAVENOUS

## 2022-08-18 MED ORDER — GADOBUTROL 1 MMOL/ML IV SOLN
10.0000 mL | Freq: Once | INTRAVENOUS | Status: AC | PRN
  Administered 2022-08-18: 10 mL via INTRAVENOUS

## 2022-08-18 MED ORDER — PROPOFOL 10 MG/ML IV BOLUS
INTRAVENOUS | Status: DC | PRN
  Administered 2022-08-18: 200 mg via INTRAVENOUS
  Administered 2022-08-18: 25 mg via INTRAVENOUS

## 2022-08-18 MED ORDER — DEXAMETHASONE SODIUM PHOSPHATE 10 MG/ML IJ SOLN
INTRAMUSCULAR | Status: DC | PRN
  Administered 2022-08-18: 5 mg via INTRAVENOUS

## 2022-08-18 MED ORDER — LIDOCAINE 2% (20 MG/ML) 5 ML SYRINGE
INTRAMUSCULAR | Status: DC | PRN
  Administered 2022-08-18: 100 mg via INTRAVENOUS

## 2022-08-18 MED ORDER — LACTATED RINGERS IV SOLN: INTRAVENOUS | Status: DC

## 2022-08-18 MED ORDER — ROCURONIUM BROMIDE 10 MG/ML (PF) SYRINGE
PREFILLED_SYRINGE | INTRAVENOUS | Status: DC | PRN
  Administered 2022-08-18: 60 mg via INTRAVENOUS

## 2022-08-18 MED ORDER — ORAL CARE MOUTH RINSE: 15.0000 mL | Freq: Once | OROMUCOSAL | Status: AC

## 2022-08-18 MED ORDER — CHLORHEXIDINE GLUCONATE 0.12 % MT SOLN
15.0000 mL | Freq: Once | OROMUCOSAL | Status: AC
  Administered 2022-08-18: 15 mL via OROMUCOSAL
  Filled 2022-08-18: qty 15

## 2022-08-18 MED ORDER — LABETALOL HCL 5 MG/ML IV SOLN
INTRAVENOUS | Status: AC
  Filled 2022-08-18: qty 4

## 2022-08-18 MED ORDER — LABETALOL HCL 5 MG/ML IV SOLN
5.0000 mg | INTRAVENOUS | Status: DC | PRN
  Administered 2022-08-18: 5 mg via INTRAVENOUS

## 2022-08-18 MED ORDER — ONDANSETRON HCL 4 MG/2ML IJ SOLN
INTRAMUSCULAR | Status: DC | PRN
  Administered 2022-08-18: 4 mg via INTRAVENOUS

## 2022-08-18 NOTE — Transfer of Care (Signed)
Immediate Anesthesia Transfer of Care Note  Patient: [name]  Procedure(s) Performed: MRI BRAIN WITH AND WITHOUT CONTRAST; MRI CERVICAL SPINE WITHE AND WITOUT CONTRAST WITH ANESTHESIA  Patient Location: PACU  Anesthesia Type:General  Level of Consciousness: patient cooperative and responds to stimulation  Airway & Oxygen Therapy: Patient Spontanous Breathing and Patient connected to nasal cannula oxygen  Post-op Assessment: Report given to RN and Post -op Vital signs reviewed and stable  Post vital signs: Reviewed and stable  Last Vitals:  Vitals Value Taken Time  BP 186/87 08/18/22 1121  Temp    Pulse 95 08/18/22 1123  Resp 18 08/18/22 1123  SpO2 90 % 08/18/22 1123  Vitals shown include unvalidated device data.  Last Pain:  Vitals:   08/18/22 0844  TempSrc:   PainSc: 10-Worst pain ever         Complications: No notable events documented.

## 2022-08-19 NOTE — Anesthesia Postprocedure Evaluation (Signed)
Anesthesia Post Note  Patient: [name]  Procedure(s) Performed: MRI BRAIN WITH AND WITHOUT CONTRAST; MRI CERVICAL SPINE WITHE AND WITOUT CONTRAST WITH ANESTHESIA     Patient location during evaluation: PACU Anesthesia Type: General Level of consciousness: awake and alert Pain management: pain level controlled Vital Signs Assessment: post-procedure vital signs reviewed and stable Respiratory status: spontaneous breathing, nonlabored ventilation, respiratory function stable and patient connected to nasal cannula oxygen Cardiovascular status: blood pressure returned to baseline and stable Postop Assessment: no apparent nausea or vomiting Anesthetic complications: no   No notable events documented.  Last Vitals:  Vitals:   08/18/22 1200 08/18/22 1215  BP: (!) 177/103 (!) 179/99  Pulse: 87 86  Resp: 16 14  Temp:  36.4 C  SpO2: 94% 94%    Last Pain:  Vitals:   08/18/22 1215  TempSrc:   PainSc: 0-No pain                 Effie Berkshire

## 2022-08-21 ENCOUNTER — Other Ambulatory Visit: Payer: Self-pay | Admitting: Neurology

## 2022-08-24 NOTE — Telephone Encounter (Signed)
Last seen on 08/12/22 Follow up scheduled on 01/11/23 Last filled on 03/02/22 # 180 tablets (90 day supply)  Dr.Sater was patient to continue Rx? I couldn't find it mentioned in recent note.  Rx is pending to be signed.

## 2022-09-28 ENCOUNTER — Ambulatory Visit: Payer: Medicare Other | Admitting: Neurology

## 2022-11-11 ENCOUNTER — Other Ambulatory Visit: Payer: Self-pay | Admitting: *Deleted

## 2022-11-11 DIAGNOSIS — G35 Multiple sclerosis: Secondary | ICD-10-CM

## 2022-11-11 MED ORDER — OCREVUS 300 MG/10ML IV SOLN
600.0000 mg | INTRAVENOUS | 1 refills | Status: DC
Start: 2022-11-11 — End: 2023-06-10

## 2022-11-24 ENCOUNTER — Telehealth: Payer: Self-pay | Admitting: *Deleted

## 2022-11-24 ENCOUNTER — Other Ambulatory Visit: Payer: Self-pay | Admitting: Neurology

## 2022-11-24 ENCOUNTER — Other Ambulatory Visit: Payer: Self-pay | Admitting: Endocrinology

## 2022-11-24 DIAGNOSIS — Z79899 Other long term (current) drug therapy: Secondary | ICD-10-CM

## 2022-11-24 DIAGNOSIS — G35 Multiple sclerosis: Secondary | ICD-10-CM

## 2022-11-24 DIAGNOSIS — Z1231 Encounter for screening mammogram for malignant neoplasm of breast: Secondary | ICD-10-CM

## 2022-11-24 NOTE — Telephone Encounter (Signed)
Message received from [facility]:    [name] Female, 62 y.o., 05-28-1960 MRN: [id number] Pt had a Video visit back in March but never got labs done.Marland Kitchen She will need to come in for labs ASAP before her  next infusion in AUG.   What labs do you usually have done for Ocrevus pts routinely?

## 2022-11-24 NOTE — Telephone Encounter (Signed)
I called pt and relayed that she needs to come in for labs for her upcoming infusion in aug.  She will come next week tues.  Around 0900.  She asked about billing question.  I will send message to billing for them to call her.

## 2022-11-26 ENCOUNTER — Other Ambulatory Visit: Payer: Self-pay | Admitting: Neurology

## 2022-11-26 NOTE — Telephone Encounter (Signed)
Pt last seen on 08/12/22  Follow up scheduled 01/11/23 Last filled on 11/18/22 #180 tablets (90 day supply)

## 2022-12-04 ENCOUNTER — Telehealth: Payer: Self-pay | Admitting: Neurology

## 2022-12-04 ENCOUNTER — Other Ambulatory Visit (INDEPENDENT_AMBULATORY_CARE_PROVIDER_SITE_OTHER): Payer: Self-pay

## 2022-12-04 DIAGNOSIS — Z0289 Encounter for other administrative examinations: Secondary | ICD-10-CM

## 2022-12-04 DIAGNOSIS — Z79899 Other long term (current) drug therapy: Secondary | ICD-10-CM

## 2022-12-04 DIAGNOSIS — G35 Multiple sclerosis: Secondary | ICD-10-CM

## 2022-12-04 NOTE — Telephone Encounter (Signed)
Called and spoke to patient who reports worsening leg weakness and shaking in the hands. She reports it has been getting worse for a month, I advised her on her appointment date and that we have added her to the waitlist. I advised ER or urgent care if sudden onset occurs of symptoms changes, she was agreeable. Will leave in basket to see upon return of staff on Monday.

## 2022-12-04 NOTE — Telephone Encounter (Signed)
Pt stated that she is getting weaker and has been more shaky. Pt is scheduled for 01/11/23 and would like to be seen sooner if possible. Pt's appointment is on the wait list.

## 2022-12-05 LAB — CBC WITH DIFFERENTIAL/PLATELET
Basos: 1 %
EOS (ABSOLUTE): 0.1 10*3/uL (ref 0.0–0.4)
Hematocrit: 41.4 % (ref 34.0–46.6)
Hemoglobin: 13.7 g/dL (ref 11.1–15.9)
Immature Grans (Abs): 0 10*3/uL (ref 0.0–0.1)
Immature Granulocytes: 0 %
Lymphocytes Absolute: 1.9 10*3/uL (ref 0.7–3.1)
Lymphs: 29 %
MCH: 28.8 pg (ref 26.6–33.0)
MCHC: 33.1 g/dL (ref 31.5–35.7)
MCV: 87 fL (ref 79–97)
Monocytes Absolute: 0.5 10*3/uL (ref 0.1–0.9)
Monocytes: 7 %
Neutrophils: 62 %
Platelets: 262 10*3/uL (ref 150–450)
RBC: 4.75 x10E6/uL (ref 3.77–5.28)
WBC: 6.6 10*3/uL (ref 3.4–10.8)

## 2022-12-05 LAB — IGG, IGA, IGM: IgM (Immunoglobulin M), Srm: 87 mg/dL (ref 26–217)

## 2022-12-05 LAB — CD20 B CELLS

## 2022-12-07 ENCOUNTER — Ambulatory Visit (INDEPENDENT_AMBULATORY_CARE_PROVIDER_SITE_OTHER): Payer: 59 | Admitting: Neurology

## 2022-12-07 ENCOUNTER — Encounter: Payer: Self-pay | Admitting: Neurology

## 2022-12-07 VITALS — BP 137/81 | HR 92 | Ht 63.0 in | Wt 260.0 lb

## 2022-12-07 DIAGNOSIS — G35 Multiple sclerosis: Secondary | ICD-10-CM | POA: Diagnosis not present

## 2022-12-07 DIAGNOSIS — M791 Myalgia, unspecified site: Secondary | ICD-10-CM

## 2022-12-07 DIAGNOSIS — Z79899 Other long term (current) drug therapy: Secondary | ICD-10-CM

## 2022-12-07 DIAGNOSIS — R208 Other disturbances of skin sensation: Secondary | ICD-10-CM

## 2022-12-07 DIAGNOSIS — G250 Essential tremor: Secondary | ICD-10-CM

## 2022-12-07 DIAGNOSIS — R269 Unspecified abnormalities of gait and mobility: Secondary | ICD-10-CM

## 2022-12-07 DIAGNOSIS — G8929 Other chronic pain: Secondary | ICD-10-CM | POA: Diagnosis not present

## 2022-12-07 DIAGNOSIS — M542 Cervicalgia: Secondary | ICD-10-CM

## 2022-12-07 DIAGNOSIS — R519 Headache, unspecified: Secondary | ICD-10-CM

## 2022-12-07 DIAGNOSIS — E1142 Type 2 diabetes mellitus with diabetic polyneuropathy: Secondary | ICD-10-CM

## 2022-12-07 MED ORDER — TIZANIDINE HCL 2 MG PO TABS: ORAL_TABLET | ORAL | 11 refills | Status: DC

## 2022-12-07 NOTE — Progress Notes (Addendum)
[facility]  PATIENT: [name] DOB: [birth year]  REFERRING DOCTOR OR PCP: Newton Pigg, FNP SOURCE: Patient, notes from primary care, imaging and lab reports, MRI images personally reviewed.  _________________________________   HISTORICAL  CHIEF COMPLAINT:  Chief Complaint  Patient presents with   Room 10    Pt is here Alone. Pt states that her left leg is so weak that her leg buckles that started 1 week ago. Pt states that she has tremors in both of her hands. Pt states that she has a stiff neck for 1 week and 1/2. Pt states that she doesn't have any other complaints to discuss with Dr. Epimenio Foot today.     HISTORY OF PRESENT ILLNESS:  [name] is a 62 y.o. woman with multiple sclerosis, LBP and gait disturbance  Update 12/07/2022:.  She is on Ocrevus  and tolerates it well.Marland Kitchen  Her first infusion was August 2023 and last infusion was 07/01/2022.  She has tolerated the infusions well.  The right arm shakes at times starting 2 months ago.   She also has noted more difficulties with her speech.  She is having more trouble coming up with the right words and stuttering some.  Due to these new symptoms we need to check MRI of the brain and cervical spine.  She is very claustrophobic and this needs to be done under general anesthesia at [facility].     She reports more difficulty with her vision.  She sometimes has diplopia.  Even with glasses, she feels vision is poor   .    She feels gait is worse and stumbles some.   She uses the cane mostly and now not using the walker.    With her cane she can go 50 feet most days, better on a good day.   The left leg buckles and she fell walking backwards.     She is having some difficulty doing activities of daily living.   We tried  dalfampridine but it did not help.  Amantadine has not helped     She is on tizanidine (felt spasticity better on combination of baclofen and tizanidine but felt sleepy).   She continues  to note burning dysesthetic pain in the arms and legs. Her feet have a burning.   She use an ointment on her feet sith some benefit.  Lamotrigine and oxcarbazepine help the dysesthesias some.  Besides MS she has NIDDM (was n insulin, now on Mounjaro).  She has also lost some weight.  She is on lamotrigine as well for pain.   Gabapentin had not helped and she gained weight on it .  Her vision is doing about the same.  She has urinary urgency and frequency.   Mood is doing well and she denies depression or anxiety.    She is on Cymbalta 60 mg po qd and lamotrigine 200 mg po bid.     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  She is on eszopiclone   She sees [facility] for Pain Management and other controlled substances.     She has neck pain that is worse.   Pain increases with movements.  Left=right.   She also has LBP and sees Dr. Heath Gold for pain management and is on  oxycodone 15 mg q6  She had MBBB/RFA helped the right side but not the left side.  Surgery has been discussed with her.   She is also on tizanidine, baclofen  and amitriptyline to help her pain.   She sees Dr. Retia Passe.   MS HISTORY She was diagnosed with MS in 2008 after presenting with leg weakness and headache, neck pain, back pain.  MRIs showed foci in her cervical spine and brain.   She was started on Rebif.   Initially she did well but she reports having a lot of relapses.   She was switched to Copaxone but also had more symptoms.   She was then switched to Tysabri for several years but reports having several relapses.   She switched to Tecfidera 5 years ago.   She reports having a flare in 2015 and had 5 days of IV Solu-medrol.   July 2021, she was complaining of blurry vision, stiff neck and increased burning and tingling a few weeks ago.  She went to the ED and had MRIs performed showing no new lesions.    She switched to [facility] August 2023  IMAGING: MRI of the cervical spine 11/18/2019 and  03/02/2012 showed two T2 hyperintense foci adjacent to C2 and C3-C4.  There was mild multilevel degenerative changes as well.    MRI of the brain 11/18/2019 and 03/02/2020 show a fairly low plaque burden of T2/FLAIR hyperintense foci.  Most of them are nonspecific.  A few are periventricular.  None acute and no change over time.  MRI of the cervical spine 02/18/2021 showed no new lesions an stable DJD with C5C6 and C6C7 mild spinal senosis  MRI of the brain 02/18/2021 showed no change compared to 11/18/2019  MR brain and spinal cord 08/18/2022 showed no new lesions  REVIEW OF SYSTEMS: Constitutional: No fevers, chills, sweats, or change in appetite.  She has fatigue and she reports insomnia Eyes: No visual changes, double vision, eye pain Ear, nose and throat: No hearing loss, ear pain, nasal congestion, sore throat Cardiovascular: No chest pain, palpitations Respiratory:  No shortness of breath at rest or with exertion.   No wheezes GastrointestinaI: No nausea, vomiting, diarrhea, abdominal pain, fecal incontinence Genitourinary:  No dysuria, urinary retention or frequency.  No nocturia. Musculoskeletal: Pain all over in muscles, joints and spine Integumentary: No rash, pruritus, skin lesions Neurological: as above Psychiatric: She has depression and anxiety Endocrine: No palpitations, diaphoresis, change in appetite, change in weigh or increased thirst Hematologic/Lymphatic:  No anemia, purpura, petechiae. Allergic/Immunologic: No itchy/runny eyes, nasal congestion, recent allergic reactions, rashes  ALLERGIES: No Known Allergies  HOME MEDICATIONS:  Current Outpatient Medications:    amitriptyline (ELAVIL) 25 MG tablet, TAKE 1 TABLET(25 MG) BY MOUTH AT BEDTIME, Disp: 30 tablet, Rfl: 5   amLODipine (NORVASC) 10 MG tablet, Take 1 tablet (10 mg total) by mouth daily., Disp: , Rfl:    atorvastatin (LIPITOR) 80 MG tablet, Take 1 tablet (80 mg total) by mouth daily. (Patient taking differently:  Take 80 mg by mouth every evening.), Disp: 90 tablet, Rfl: 3   Cholecalciferol (VITAMIN D-3) 125 MCG (5000 UT) TABS, Take 5,000 Units by mouth in the morning., Disp: , Rfl:    collagenase (SANTYL) 250 UNIT/GM ointment, Apply 1 Application topically daily., Disp: 15 g, Rfl: 0   dicyclomine (BENTYL) 20 MG tablet, Take 40 mg by mouth 2 (two) times daily., Disp: , Rfl:    diphenoxylate-atropine (LOMOTIL) 2.5-0.025 MG tablet, Take 3 tablets by mouth 2 (two) times daily., Disp: , Rfl:    DULoxetine (CYMBALTA) 60 MG capsule, Take 60 mg by mouth 2 (two) times daily., Disp: , Rfl:    eszopiclone (LUNESTA) 1 MG  TABS tablet, Take 1 mg by mouth at bedtime., Disp: , Rfl:    famotidine (PEPCID) 40 MG tablet, Take 40 mg by mouth 2 (two) times daily., Disp: , Rfl:    fluticasone (FLONASE) 50 MCG/ACT nasal spray, Place 2 sprays into the nose daily. (Patient taking differently: Place 2 sprays into both nostrils in the morning and at bedtime.), Disp: 16 g, Rfl: 11   gabapentin (NEURONTIN) 100 MG capsule, Take 100 mg by mouth 2 (two) times daily., Disp: , Rfl:    Homeopathic Products (LEG CRAMPS PO), Place 3 tablets under the tongue daily as needed. 3 am tablets and 3 pm tablets., Disp: , Rfl:    ibuprofen (ADVIL) 800 MG tablet, Take 800 mg by mouth 3 (three) times daily., Disp: , Rfl:    lamoTRIgine (LAMICTAL) 200 MG tablet, TAKE 1 TABLET BY MOUTH TWICE DAILY, Disp: 60 tablet, Rfl: 5   lansoprazole (PREVACID) 30 MG capsule, Take 30 mg by mouth 2 (two) times daily before a meal., Disp: , Rfl:    loperamide (IMODIUM) 2 MG capsule, Take 1 capsule (2 mg total) by mouth 2 (two) times daily., Disp: 30 capsule, Rfl: 0   losartan (COZAAR) 100 MG tablet, Take 1 tablet (100 mg total) by mouth daily. (Patient taking differently: Take 100 mg by mouth at bedtime.), Disp: 90 tablet, Rfl: 3   MAGNESIUM-OXIDE 400 (240 Mg) MG tablet, Take 400 mg by mouth daily., Disp: , Rfl:    montelukast (SINGULAIR) 10 MG tablet, Take 10 mg by mouth  at bedtime., Disp: , Rfl:    nystatin cream (MYCOSTATIN), Apply 1 Application topically 2 (two) times daily., Disp: , Rfl:    ocrelizumab (OCREVUS) 300 MG/10ML injection, Inject 20 mLs (600 mg total) into the vein every 6 (six) months., Disp: 20 mL, Rfl: 1   Oxcarbazepine (TRILEPTAL) 300 MG tablet, TAKE 1 TABLET(300 MG) BY MOUTH TWICE DAILY, Disp: 60 tablet, Rfl: 5   oxyCODONE (ROXICODONE) 15 MG immediate release tablet, Take 15 mg by mouth every 4 (four) hours as needed for pain. May take up to a total of 6 times per day if needed, Disp: , Rfl:    pantoprazole (PROTONIX) 40 MG tablet, Take 40 mg by mouth 2 (two) times daily., Disp: , Rfl:    tirzepatide (MOUNJARO) 7.5 MG/0.5ML Pen, Inject 7.5 mg into the skin once a week., Disp: , Rfl:    tiZANidine (ZANAFLEX) 2 MG tablet, Take 2 mg by mouth 2 (two) times daily., Disp: , Rfl:    VIBERZI 100 MG TABS, Take 100 mg by mouth 2 (two) times daily., Disp: , Rfl:    vitamin B-12 (CYANOCOBALAMIN) 500 MCG tablet, Take 500 mcg by mouth in the morning., Disp: , Rfl:    XIIDRA 5 % SOLN, Place 2 drops into both eyes in the morning and at bedtime., Disp: , Rfl:    Accu-Chek Softclix Lancets lancets, Use as directed up to 4 times daily (Patient not taking: Reported on 07/06/2022), Disp: 100 each, Rfl: 5   Blood Glucose Monitoring Suppl (BLOOD GLUCOSE MONITOR SYSTEM) w/Device KIT, use as directed (Patient not taking: Reported on 03/25/2022), Disp: 1 kit, Rfl: 0   glucose blood (ACCU-CHEK GUIDE) test strip, Use as instructed up to 4 times daily (Patient not taking: Reported on 03/25/2022), Disp: 100 each, Rfl: 12   Insulin Pen Needle 32G X 4 MM MISC, use as directed 4 times daily (Patient not taking: Reported on 03/25/2022), Disp: 200 each, Rfl: 2   MELATONIN MAXIMUM  STRENGTH 5 MG TABS, Take 5 mg by mouth at bedtime. (Patient not taking: Reported on 12/07/2022), Disp: , Rfl:    NARCAN 4 MG/0.1ML LIQD nasal spray kit, Place 1 spray into the nose once as needed (overdose).  (Patient not taking: Reported on 12/07/2022), Disp: , Rfl:   PAST MEDICAL HISTORY: Past Medical History:  Diagnosis Date   Acute cystitis    Acute metabolic encephalopathy    Anemia    Arthritis    Chronic pain    Diabetes mellitus    Fibromyalgia    GERD (gastroesophageal reflux disease)    Hepatitis C    received treatment   History of methicillin resistant staphylococcus aureus (MRSA)    Hypercholesteremia    Hypertension    IBS (irritable bowel syndrome)    Multiple sclerosis exacerbation (HCC)    Neurogenic bladder    pt denies having to cath herself-says she voids on her own as of 11-21-21   Neuropathy    Obesity    Sacral nerve stimulator present    battery is dead and has not worked in years   Stroke [facility])    pt denies this but pcp has it in their history   Tachycardia    UTI (urinary tract infection)    Vitamin D deficiency     PAST SURGICAL HISTORY: Past Surgical History:  Procedure Laterality Date   ABDOMINAL HYSTERECTOMY     partial   I & D EXTREMITY Right 12/14/2020   Procedure: IRRIGATION AND DEBRIDEMENT RIGHT LOWER EXTREMITY;  Surgeon: Samson Frederic, MD;  Location: [facility] OR;  Service: Orthopedics;  Laterality: Right;   JOINT REPLACEMENT     LTK   ORIF ANKLE FRACTURE Left 11/25/2021   Procedure: OPEN REDUCTION INTERNAL FIXATION (ORIF) LEFT ANKLE FRACTURE;  Surgeon: Candelaria Stagers, DPM;  Location: [facility] ORS;  Service: Orthopedics;  Laterality: Left;  POPLITEAL BLOCK   RADIOLOGY WITH ANESTHESIA N/A 02/18/2021   Procedure: MRI WITH ANESTHESIA CERVICAL SPINE WITH AND WITHOUT CONTRAST AND BRAIN WITH AND WITHOUT CONTRAST;  Surgeon: Radiologist, Medication, MD;  Location: [facility] OR;  Service: Radiology;  Laterality: N/A;   RADIOLOGY WITH ANESTHESIA N/A 07/07/2021   Procedure: MRI WITH ANESTHESIA;  Surgeon: Radiologist, Medication, MD;  Location: [facility] OR;  Service: Radiology;  Laterality: N/A;   RADIOLOGY WITH ANESTHESIA N/A 08/18/2022   Procedure: MRI BRAIN WITH AND WITHOUT  CONTRAST; MRI CERVICAL SPINE WITHE AND WITOUT CONTRAST WITH ANESTHESIA;  Surgeon: Radiologist, Medication, MD;  Location: [facility] OR;  Service: Radiology;  Laterality: N/A;   SACRAL NERVE STIMULATOR PLACEMENT     [employer]   SYNDESMOSIS REPAIR Left 11/25/2021   Procedure: SYNDESMOSIS REPAIR;  Surgeon: Candelaria Stagers, DPM;  Location: [facility] ORS;  Service: Orthopedics;  Laterality: Left;   TUBAL LIGATION      FAMILY HISTORY: Family History  Problem Relation Age of Onset   Diabetes Mother    Hypertension Mother    Stroke Father     SOCIAL HISTORY:  Social History   Socioeconomic History   Marital status: Single    Spouse name: Not on file   Number of children: 2   Years of education: Not on file   Highest education level: Not on file  Occupational History   Not on file  Tobacco Use   Smoking status: Former    Current packs/day: 0.00    Average packs/day: 0.3 packs/day for 12.0 years (3.0 ttl pk-yrs)    Types: Cigarettes    Start date: 03/2009  Quit date: 03/2021    Years since quitting: 1.7   Smokeless tobacco: Never   Tobacco comments:    06/13/20 1 pk per 3 days  Vaping Use   Vaping status: Never Used  Substance and Sexual Activity   Alcohol use: No   Drug use: No   Sexual activity: Not on file  Other Topics Concern   Not on file  Social History Narrative   06/13/20 lives alone, dgtr across the street   Social Determinants of Health   Financial Resource Strain: Not on file  Food Insecurity: Not on file  Transportation Needs: Not on file  Physical Activity: Not on file  Stress: Not on file  Social Connections: Not on file  Intimate Partner Violence: Not on file     PHYSICAL EXAM  Vitals:   12/07/22 1014  BP: 137/81  Pulse: 92  Weight: 260 lb (117.9 kg)  Height: 5\' 3"  (1.6 m)     Body mass index is 46.06 kg/m.   General: The patient is well-developed and well-nourished and in no acute distress  HEENT:  Head is [place]/AT.  Sclera are anicteric.     Skin:  Extremities are without rash.  There is mild ankle edema Musculoskeletal: She has tenderness in the cervical and lumbar paraspinal muscles.  Neurologic Exam  Mental status: The patient is alert and oriented x 3 at the time of the examination. The patient has apparent normal recent and remote memory, with an apparently normal attention span and concentration ability.   Speech is normal.  Cranial nerves: Extraocular movements are full.  Facial strength and sensation was normal.  No obvious hearing deficits are noted.  Motor:  Minimal hand intention tremor.  Muscle bulk is normal.   Tone is slightly increased in legs.  . Strength is  5 / 5 in all 4 extremities except 4+/5 ankles,4/5 toes, slightly worse on left. .   Sensory: She reported reduced sensation to touch and temperature in the left arm and leg.  Vibration sensation was more symmetric.  Decreased sensation to vibration in the toes and ankles compared to the knees  Coordination: Cerebellar testing reveals good finger-nose-finger and heel-to-shin bilaterally.  Gait and station: Station is normal.   She has an arthritic and wide gait with a reduced stride..  She is unable to do her tandem walk.  Romberg is negative.   Reflexes: Deep tendon reflexes are 1 and symmetric in the limbs.       DIAGNOSTIC DATA (LABS, IMAGING, TESTING) - I reviewed patient records, labs, notes, testing and imaging myself where available.  Lab Results  Component Value Date   WBC 6.6 12/04/2022   HGB 13.7 12/04/2022   HCT 41.4 12/04/2022   MCV 87 12/04/2022   PLT 262 12/04/2022      Component Value Date/Time   NA 140 08/18/2022 0839   NA 143 03/25/2022 1030   K 3.7 08/18/2022 0839   CL 104 08/18/2022 0839   CO2 21 03/25/2022 1030   GLUCOSE 120 (H) 08/18/2022 0839   BUN 10 08/18/2022 0839   BUN 11 03/25/2022 1030   CREATININE 0.70 08/18/2022 0839   CREATININE 0.85 02/24/2012 1044   CALCIUM 10.2 03/25/2022 1030   PROT 7.5 03/25/2022 1030    ALBUMIN 4.8 03/25/2022 1030   AST 17 03/25/2022 1030   ALT 16 03/25/2022 1030   ALKPHOS 136 (H) 03/25/2022 1030   BILITOT 0.3 03/25/2022 1030   GFRNONAA 38 (L) 08/01/2021 0505   GFRAA >60  11/17/2019 1242   Lab Results  Component Value Date   CHOL (H) 02/19/2009    237        ATP III CLASSIFICATION:  <200     mg/dL   Desirable  308-657  mg/dL   Borderline High  >=846    mg/dL   High          HDL 52 02/19/2009   LDLCALC (H) 02/19/2009    135        Total Cholesterol/HDL:CHD Risk Coronary Heart Disease Risk Table                     Men   Women  1/2 Average Risk   3.4   3.3  Average Risk       5.0   4.4  2 X Average Risk   9.6   7.1  3 X Average Risk  23.4   11.0        Use the calculated Patient Ratio above and the CHD Risk Table to determine the patient's CHD Risk.        ATP III CLASSIFICATION (LDL):  <100     mg/dL   Optimal  962-952  mg/dL   Near or Above                    Optimal  130-159  mg/dL   Borderline  841-324  mg/dL   High  >401     mg/dL   Very High   TRIG 027 (H) 02/19/2009   CHOLHDL 4.6 02/19/2009   Lab Results  Component Value Date   HGBA1C 5.5 07/06/2021   Lab Results  Component Value Date   VITAMINB12 1,987 (H) 03/25/2022   Lab Results  Component Value Date   TSH 1.080 10/23/2021       ASSESSMENT AND PLAN  Multiple sclerosis (HCC)  High risk medication use  Other chronic pain  Gait disturbance  Headache in back of head  Neck pain  Essential tremor  Diabetic peripheral neuropathy (HCC)  Dysesthesia     1.    Continue Ocrevus.  IgG and IgM were fine a week ago.  We discussed the risk of infection and to make sure to seek care if any suspected infection.   2.    Increase tizanidine --- can increase further up to 16 mg/day 3.    Encouraged weight loss.   Advised to eat healthy.  Continue vitamin D. 4.    Use walker instead of cane when more tired.    5.    TPI bilateral C5C6 and C6C7 paraspinal muscles with 40 mg  Depo-medrol in 5 cc Marcaine using sterile technique.    She tolerated the injection well and pain was better afterwards.   Advised to watch sugars next 72 hours.   6.    Return in 6 months or sooner if there are new or worsening neurologic symptoms.   This visit is part of a comprehensive longitudinal care medical relationship regarding the patients primary diagnosis of MS and related concerns.  Breuna Loveall A. Epimenio Foot, MD, [facility] 12/07/2022, 10:37 AM Certified in Neurology, Clinical Neurophysiology, Sleep Medicine and Neuroimaging  [facility] [address], [address] [place], [place] [address] [phone]

## 2022-12-08 LAB — CBC WITH DIFFERENTIAL/PLATELET
Basophils Absolute: 0 10*3/uL (ref 0.0–0.2)
Eos: 1 %
Neutrophils Absolute: 4.2 10*3/uL (ref 1.4–7.0)
RDW: 14 % (ref 11.7–15.4)

## 2022-12-08 LAB — IGG, IGA, IGM
IgA/Immunoglobulin A, Serum: 310 mg/dL (ref 87–352)
IgG (Immunoglobin G), Serum: 825 mg/dL (ref 586–1602)

## 2022-12-08 LAB — CD20 B CELLS: % CD19-B Cells: 0 % — ABNORMAL LOW (ref 4.6–22.1)

## 2022-12-14 ENCOUNTER — Ambulatory Visit
Admission: RE | Admit: 2022-12-14 | Discharge: 2022-12-14 | Disposition: A | Discharge status: Home / Self Care | Payer: 59 | Source: Ambulatory Visit | Attending: Endocrinology | Admitting: Endocrinology

## 2022-12-14 DIAGNOSIS — Z1231 Encounter for screening mammogram for malignant neoplasm of breast: Secondary | ICD-10-CM

## 2022-12-15 ENCOUNTER — Telehealth: Payer: Self-pay | Admitting: Neurology

## 2022-12-15 NOTE — Telephone Encounter (Signed)
I called patient and explained I didn't see any mention of stopping Viberzi. Pt said her pharmacy refilled a lot of her medications and she was confused about what some of her MS medications. All questions answered.

## 2022-12-15 NOTE — Telephone Encounter (Signed)
Pt called wanting to know if she was to stop the VIBERZI 100 MG TABS when she started her infusions. Please advise.

## 2022-12-30 ENCOUNTER — Other Ambulatory Visit: Payer: Self-pay | Admitting: Neurology

## 2023-01-11 ENCOUNTER — Ambulatory Visit: Payer: 59 | Admitting: Neurology

## 2023-01-20 ENCOUNTER — Encounter: Payer: Self-pay | Admitting: Neurology

## 2023-01-20 ENCOUNTER — Ambulatory Visit (INDEPENDENT_AMBULATORY_CARE_PROVIDER_SITE_OTHER): Payer: 59 | Admitting: Neurology

## 2023-01-20 VITALS — BP 149/99 | HR 93 | Ht 63.0 in | Wt 257.0 lb

## 2023-01-20 DIAGNOSIS — R269 Unspecified abnormalities of gait and mobility: Secondary | ICD-10-CM

## 2023-01-20 DIAGNOSIS — G8929 Other chronic pain: Secondary | ICD-10-CM | POA: Diagnosis not present

## 2023-01-20 DIAGNOSIS — Z794 Long term (current) use of insulin: Secondary | ICD-10-CM

## 2023-01-20 DIAGNOSIS — Z79899 Other long term (current) drug therapy: Secondary | ICD-10-CM | POA: Diagnosis not present

## 2023-01-20 DIAGNOSIS — G35 Multiple sclerosis: Secondary | ICD-10-CM

## 2023-01-20 DIAGNOSIS — M542 Cervicalgia: Secondary | ICD-10-CM

## 2023-01-20 DIAGNOSIS — E1142 Type 2 diabetes mellitus with diabetic polyneuropathy: Secondary | ICD-10-CM

## 2023-01-20 NOTE — Progress Notes (Signed)
[facility]  PATIENT: [name] DOB: 31-Jul-1960  REFERRING DOCTOR OR PCP: Newton Pigg, FNP SOURCE: Patient, notes from primary care, imaging and lab reports, MRI images personally reviewed.  _________________________________   HISTORICAL  CHIEF COMPLAINT:  Chief Complaint  Patient presents with   Follow-up    Pt in room 11. Here for MS follow up c/o leg weakness. Pt said left leg is weaker than right, feels stiff and burning and numbness. Pt said this started 2 weeks ago.  No recent falls.    HISTORY OF PRESENT ILLNESS:  [name] is a 62 y.o. woman with multiple sclerosis, LBP and gait disturbance  Update 01/20/2023:.  She is on Ocrevus  and tolerates it well.Marland Kitchen  Her first infusion was August 2023 and last infusion was 12/18/2022.  She has tolerated the infusions well.   She feels gait is worse,  He left leg buckles.   She uses the cane mostly and now not using the walker.    With her cane she can go 100 feet most days, better on a good day.   She is having some difficulty doing activities of daily living.   We tried dalfampridine but it did not help.  Amantadine has not helped     She is on tizanidine (felt spasticity better on combination of baclofen and tizanidine but felt sleepy).   She reports more difficulty with her vision.  She sometimes has diplopia.  Even with glasses, she feels vision is poor.   She may have early glaucoma an sees ophthalmology.    She continues to note burning dysesthetic pain in the arms and legs. Her feet have a burning.    Lamotrigine and oxcarbazepine help the dysesthesias some.  Gabapentin had not helped pain and she gained weight on it .  Her vision is doing about the same.  She has urinary urgency and frequency.  esides MS she has NIDDM (was on insulin, now on Mounjaro and lost 10-15 pounds).    She has jaw pain on the right, worse with mouth open or chewing.   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    She is on Cymbalta 60 mg po qd and lamotrigine 200 mg po bid.     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  She is on eszopiclone   She sees [facility] and other controlled substances.     She has neck pain that is worse.   Pain increases with movements.  Left=right.   She also has LBP and sees Dr. Heath Gold for pain management and is on  oxycodone 15 mg q6  She had MBBB/RFA helped the right side but not the left side.  Surgery has been discussed with her.   She is also on tizanidine, baclofen and amitriptyline to help her pain.   She sees Dr. Retia Passe.   MS HISTORY She was diagnosed with MS in 2008 after presenting with leg weakness and headache, neck pain, back pain.  MRIs showed foci in her cervical spine and brain.   She was started on Rebif.   Initially she did well but she reports having a lot of relapses.   She was switched to Copaxone but also had more symptoms.   She was then switched to Tysabri for several years but reports having several relapses.   She switched to Tecfidera 5  years ago.   She reports having a flare in 2015 and had 5 days of IV Solu-medrol.   July 2021, she was complaining of blurry vision, stiff neck and increased burning and tingling a few weeks ago.  She went to the ED and had MRIs performed showing no new lesions.    She switched to [facility] August 2023  IMAGING: MRI of the cervical spine 11/18/2019 and 03/02/2012 showed two T2 hyperintense foci adjacent to C2 and C3-C4.  There was mild multilevel degenerative changes as well.    MRI of the brain 11/18/2019 and 03/02/2020 show a fairly low plaque burden of T2/FLAIR hyperintense foci.  Most of them are nonspecific.  A few are periventricular.  None acute and no change over time.  MRI of the cervical spine 02/18/2021 showed no new lesions an stable DJD with C5C6 and C6C7 mild spinal senosis  MRI of the brain 02/18/2021 showed no  change compared to 11/18/2019  MR brain and spinal cord 08/18/2022 showed no new lesions  REVIEW OF SYSTEMS: Constitutional: No fevers, chills, sweats, or change in appetite.  She has fatigue and she reports insomnia Eyes: No visual changes, double vision, eye pain Ear, nose and throat: No hearing loss, ear pain, nasal congestion, sore throat Cardiovascular: No chest pain, palpitations Respiratory:  No shortness of breath at rest or with exertion.   No wheezes GastrointestinaI: No nausea, vomiting, diarrhea, abdominal pain, fecal incontinence Genitourinary:  No dysuria, urinary retention or frequency.  No nocturia. Musculoskeletal: Pain all over in muscles, joints and spine Integumentary: No rash, pruritus, skin lesions Neurological: as above Psychiatric: She has depression and anxiety Endocrine: No palpitations, diaphoresis, change in appetite, change in weigh or increased thirst Hematologic/Lymphatic:  No anemia, purpura, petechiae. Allergic/Immunologic: No itchy/runny eyes, nasal congestion, recent allergic reactions, rashes  ALLERGIES: No Known Allergies  HOME MEDICATIONS:  Current Outpatient Medications:    amitriptyline (ELAVIL) 25 MG tablet, TAKE 1 TABLET(25 MG) BY MOUTH AT BEDTIME, Disp: 30 tablet, Rfl: 5   amLODipine (NORVASC) 10 MG tablet, Take 1 tablet (10 mg total) by mouth daily., Disp: , Rfl:    atorvastatin (LIPITOR) 80 MG tablet, Take 1 tablet (80 mg total) by mouth daily. (Patient taking differently: Take 80 mg by mouth every evening.), Disp: 90 tablet, Rfl: 3   Cholecalciferol (VITAMIN D-3) 125 MCG (5000 UT) TABS, Take 5,000 Units by mouth in the morning., Disp: , Rfl:    collagenase (SANTYL) 250 UNIT/GM ointment, Apply 1 Application topically daily., Disp: 15 g, Rfl: 0   dicyclomine (BENTYL) 20 MG tablet, Take 40 mg by mouth 2 (two) times daily., Disp: , Rfl:    diphenoxylate-atropine (LOMOTIL) 2.5-0.025 MG tablet, Take 3 tablets by mouth 2 (two) times daily., Disp: ,  Rfl:    DULoxetine (CYMBALTA) 60 MG capsule, Take 60 mg by mouth 2 (two) times daily., Disp: , Rfl:    eszopiclone (LUNESTA) 1 MG TABS tablet, Take 1 mg by mouth at bedtime., Disp: , Rfl:    famotidine (PEPCID) 40 MG tablet, Take 40 mg by mouth 2 (two) times daily., Disp: , Rfl:    fluticasone (FLONASE) 50 MCG/ACT nasal spray, Place 2 sprays into the nose daily. (Patient taking differently: Place 2 sprays into both nostrils in the morning and at bedtime.), Disp: 16 g, Rfl: 11   gabapentin (NEURONTIN) 100 MG capsule, Take 100 mg by mouth 2 (two) times daily., Disp: , Rfl:    Homeopathic Products (LEG CRAMPS PO), Place 3 tablets under the  tongue daily as needed. 3 am tablets and 3 pm tablets., Disp: , Rfl:    ibuprofen (ADVIL) 800 MG tablet, Take 800 mg by mouth 3 (three) times daily., Disp: , Rfl:    lamoTRIgine (LAMICTAL) 200 MG tablet, TAKE 1 TABLET BY MOUTH TWICE DAILY, Disp: 60 tablet, Rfl: 5   lansoprazole (PREVACID) 30 MG capsule, Take 30 mg by mouth 2 (two) times daily before a meal., Disp: , Rfl:    loperamide (IMODIUM) 2 MG capsule, Take 1 capsule (2 mg total) by mouth 2 (two) times daily., Disp: 30 capsule, Rfl: 0   losartan (COZAAR) 50 MG tablet, Take 100 mg by mouth daily. Take 2 tablets daily for total of 100 mg, Disp: , Rfl:    MAGNESIUM-OXIDE 400 (240 Mg) MG tablet, Take 400 mg by mouth daily., Disp: , Rfl:    MELATONIN MAXIMUM STRENGTH 5 MG TABS, Take 5 mg by mouth at bedtime., Disp: , Rfl:    montelukast (SINGULAIR) 10 MG tablet, Take 10 mg by mouth at bedtime., Disp: , Rfl:    nystatin cream (MYCOSTATIN), Apply 1 Application topically 2 (two) times daily., Disp: , Rfl:    ocrelizumab (OCREVUS) 300 MG/10ML injection, Inject 20 mLs (600 mg total) into the vein every 6 (six) months., Disp: 20 mL, Rfl: 1   Oxcarbazepine (TRILEPTAL) 300 MG tablet, TAKE 1 TABLET(300 MG) BY MOUTH TWICE DAILY, Disp: 60 tablet, Rfl: 5   oxyCODONE (ROXICODONE) 15 MG immediate release tablet, Take 15 mg by  mouth every 4 (four) hours as needed for pain. May take up to a total of 6 times per day if needed, Disp: , Rfl:    pantoprazole (PROTONIX) 40 MG tablet, Take 40 mg by mouth 2 (two) times daily., Disp: , Rfl:    tiZANidine (ZANAFLEX) 2 MG tablet, One po qAM, one po qPM, two po qHS, Disp: 120 tablet, Rfl: 11   VIBERZI 100 MG TABS, Take 100 mg by mouth 2 (two) times daily., Disp: , Rfl:    vitamin B-12 (CYANOCOBALAMIN) 500 MCG tablet, Take 500 mcg by mouth in the morning., Disp: , Rfl:    Accu-Chek Softclix Lancets lancets, Use as directed up to 4 times daily (Patient not taking: Reported on 07/06/2022), Disp: 100 each, Rfl: 5   Blood Glucose Monitoring Suppl (BLOOD GLUCOSE MONITOR SYSTEM) w/Device KIT, use as directed (Patient not taking: Reported on 03/25/2022), Disp: 1 kit, Rfl: 0   glucose blood (ACCU-CHEK GUIDE) test strip, Use as instructed up to 4 times daily (Patient not taking: Reported on 03/25/2022), Disp: 100 each, Rfl: 12   Insulin Pen Needle 32G X 4 MM MISC, use as directed 4 times daily (Patient not taking: Reported on 03/25/2022), Disp: 200 each, Rfl: 2   losartan (COZAAR) 100 MG tablet, Take 1 tablet (100 mg total) by mouth daily. (Patient not taking: Reported on 01/20/2023), Disp: 90 tablet, Rfl: 3   NARCAN 4 MG/0.1ML LIQD nasal spray kit, Place 1 spray into the nose once as needed (overdose). (Patient not taking: Reported on 12/07/2022), Disp: , Rfl:    tirzepatide (MOUNJARO) 7.5 MG/0.5ML Pen, Inject 7.5 mg into the skin once a week. (Patient not taking: Reported on 01/20/2023), Disp: , Rfl:    XIIDRA 5 % SOLN, Place 2 drops into both eyes in the morning and at bedtime. (Patient not taking: Reported on 01/20/2023), Disp: , Rfl:   PAST MEDICAL HISTORY: Past Medical History:  Diagnosis Date   Acute cystitis    Acute metabolic encephalopathy  Anemia    Arthritis    Chronic pain    Diabetes mellitus    Fibromyalgia    GERD (gastroesophageal reflux disease)    Hepatitis C    received  treatment   History of methicillin resistant staphylococcus aureus (MRSA)    Hypercholesteremia    Hypertension    IBS (irritable bowel syndrome)    Multiple sclerosis exacerbation (HCC)    Neurogenic bladder    pt denies having to cath herself-says she voids on her own as of 11-21-21   Neuropathy    Obesity    Sacral nerve stimulator present    battery is dead and has not worked in years   Stroke [facility])    pt denies this but pcp has it in their history   Tachycardia    UTI (urinary tract infection)    Vitamin D deficiency     PAST SURGICAL HISTORY: Past Surgical History:  Procedure Laterality Date   ABDOMINAL HYSTERECTOMY     partial   I & D EXTREMITY Right 12/14/2020   Procedure: IRRIGATION AND DEBRIDEMENT RIGHT LOWER EXTREMITY;  Surgeon: Samson Frederic, MD;  Location: [facility] OR;  Service: Orthopedics;  Laterality: Right;   JOINT REPLACEMENT     LTK   ORIF ANKLE FRACTURE Left 11/25/2021   Procedure: OPEN REDUCTION INTERNAL FIXATION (ORIF) LEFT ANKLE FRACTURE;  Surgeon: Candelaria Stagers, DPM;  Location: [facility] ORS;  Service: Orthopedics;  Laterality: Left;  POPLITEAL BLOCK   RADIOLOGY WITH ANESTHESIA N/A 02/18/2021   Procedure: MRI WITH ANESTHESIA CERVICAL SPINE WITH AND WITHOUT CONTRAST AND BRAIN WITH AND WITHOUT CONTRAST;  Surgeon: Radiologist, Medication, MD;  Location: [facility] OR;  Service: Radiology;  Laterality: N/A;   RADIOLOGY WITH ANESTHESIA N/A 07/07/2021   Procedure: MRI WITH ANESTHESIA;  Surgeon: Radiologist, Medication, MD;  Location: [facility] OR;  Service: Radiology;  Laterality: N/A;   RADIOLOGY WITH ANESTHESIA N/A 08/18/2022   Procedure: MRI BRAIN WITH AND WITHOUT CONTRAST; MRI CERVICAL SPINE WITHE AND WITOUT CONTRAST WITH ANESTHESIA;  Surgeon: Radiologist, Medication, MD;  Location: [facility] OR;  Service: Radiology;  Laterality: N/A;   SACRAL NERVE STIMULATOR PLACEMENT     [employer]   SYNDESMOSIS REPAIR Left 11/25/2021   Procedure: SYNDESMOSIS REPAIR;  Surgeon: Candelaria Stagers, DPM;  Location:  [facility] ORS;  Service: Orthopedics;  Laterality: Left;   TUBAL LIGATION      FAMILY HISTORY: Family History  Problem Relation Age of Onset   Diabetes Mother    Hypertension Mother    Stroke Father     SOCIAL HISTORY:  Social History   Socioeconomic History   Marital status: Single    Spouse name: Not on file   Number of children: 2   Years of education: Not on file   Highest education level: Not on file  Occupational History   Not on file  Tobacco Use   Smoking status: Former    Current packs/day: 0.00    Average packs/day: 0.3 packs/day for 12.0 years (3.0 ttl pk-yrs)    Types: Cigarettes    Start date: 03/2009    Quit date: 03/2021    Years since quitting: 1.8   Smokeless tobacco: Never   Tobacco comments:    06/13/20 1 pk per 3 days  Vaping Use   Vaping status: Never Used  Substance and Sexual Activity   Alcohol use: No   Drug use: No   Sexual activity: Not on file  Other Topics Concern   Not on file  Social History Narrative  06/13/20 lives alone, dgtr across the street   Social Determinants of Health   Financial Resource Strain: Not on file  Food Insecurity: Not on file  Transportation Needs: Not on file  Physical Activity: Not on file  Stress: Not on file  Social Connections: Not on file  Intimate Partner Violence: Not on file     PHYSICAL EXAM  Vitals:   01/20/23 1109 01/20/23 1117  BP: (!) 142/88 (!) 149/99  Pulse: 100 93  Weight: 257 lb (116.6 kg)   Height: 5\' 3"  (1.6 m)      Body mass index is 45.53 kg/m.   General: The patient is well-developed and well-nourished and in no acute distress  HEENT:  Head is [place]/AT.  Sclera are anicteric.     Skin: Extremities are without rash.  There is mild ankle edema Musculoskeletal: She has tenderness in the cervical and lumbar paraspinal muscles.  Neurologic Exam  Mental status: The patient is alert and oriented x 3 at the time of the examination. The patient has apparent normal recent and  remote memory, with an apparently normal attention span and concentration ability.   Speech is normal.  Cranial nerves: Extraocular movements are full.  Facial strength and sensation was normal.  No obvious hearing deficits are noted.  Motor:  Minimal hand intention tremor.  Muscle bulk is normal.   Tone is slightly increased in legs.  . Strength is  5 / 5 in all 4 extremities except 4+/5 ankles,4/5 toes, slightly worse on left. .   Sensory: She reported reduced sensation to touch and temperature in the left arm and leg.  Vibration sensation was more symmetric.  Decreased sensation to vibration in the toes and ankles compared to the knees  Coordination: Cerebellar testing reveals good finger-nose-finger and heel-to-shin bilaterally.  Gait and station: Station is normal.   She has a reduced stride that is mildly wide.  The gait is also arthritic.Marland Kitchen  She is unable to do her tandem walk.  Romberg is negative.   Reflexes: Deep tendon reflexes are 1 and symmetric in the limbs.       DIAGNOSTIC DATA (LABS, IMAGING, TESTING) - I reviewed patient records, labs, notes, testing and imaging myself where available.  Lab Results  Component Value Date   WBC 6.6 12/04/2022   HGB 13.7 12/04/2022   HCT 41.4 12/04/2022   MCV 87 12/04/2022   PLT 262 12/04/2022      Component Value Date/Time   NA 140 08/18/2022 0839   NA 143 03/25/2022 1030   K 3.7 08/18/2022 0839   CL 104 08/18/2022 0839   CO2 21 03/25/2022 1030   GLUCOSE 120 (H) 08/18/2022 0839   BUN 10 08/18/2022 0839   BUN 11 03/25/2022 1030   CREATININE 0.70 08/18/2022 0839   CREATININE 0.85 02/24/2012 1044   CALCIUM 10.2 03/25/2022 1030   PROT 7.5 03/25/2022 1030   ALBUMIN 4.8 03/25/2022 1030   AST 17 03/25/2022 1030   ALT 16 03/25/2022 1030   ALKPHOS 136 (H) 03/25/2022 1030   BILITOT 0.3 03/25/2022 1030   GFRNONAA 38 (L) 08/01/2021 0505   GFRAA >60 11/17/2019 1242   Lab Results  Component Value Date   CHOL (H) 02/19/2009    237         ATP III CLASSIFICATION:  <200     mg/dL   Desirable  841-324  mg/dL   Borderline High  >=401    mg/dL   High  HDL 52 02/19/2009   LDLCALC (H) 02/19/2009    135        Total Cholesterol/HDL:CHD Risk Coronary Heart Disease Risk Table                     Men   Women  1/2 Average Risk   3.4   3.3  Average Risk       5.0   4.4  2 X Average Risk   9.6   7.1  3 X Average Risk  23.4   11.0        Use the calculated Patient Ratio above and the CHD Risk Table to determine the patient's CHD Risk.        ATP III CLASSIFICATION (LDL):  <100     mg/dL   Optimal  324-401  mg/dL   Near or Above                    Optimal  130-159  mg/dL   Borderline  027-253  mg/dL   High  >664     mg/dL   Very High   TRIG 403 (H) 02/19/2009   CHOLHDL 4.6 02/19/2009   Lab Results  Component Value Date   HGBA1C 5.5 07/06/2021   Lab Results  Component Value Date   VITAMINB12 1,987 (H) 03/25/2022   Lab Results  Component Value Date   TSH 1.080 10/23/2021       ASSESSMENT AND PLAN  Multiple sclerosis (HCC)  High risk medication use  Other chronic pain  Neck pain  Gait disturbance  Diabetic peripheral neuropathy (HCC)    1.    Continue Ocrevus.  IgG and IgM were fine recently.   2.    Increase tizanidine (written for 2-2-4 mg but only doing 2 mg bid) --- can increase further up to 16 mg/day 3.    Encouraged weight loss.   Advised to eat healthy.  Continue vitamin D. 4.    She prefers cane over walker 5.    Return in 4-6 months or sooner if there are new or worsening neurologic symptoms.   This visit is part of a comprehensive longitudinal care medical relationship regarding the patients primary diagnosis of MS and related concerns.  [name] A. Epimenio Foot, MD, [facility] 01/20/2023, 11:59 AM Certified in Neurology, Clinical Neurophysiology, Sleep Medicine and Neuroimaging  [facility] [address], [address] [place], [place] [address] [phone]

## 2023-01-27 ENCOUNTER — Telehealth: Payer: Self-pay | Admitting: *Deleted

## 2023-01-27 NOTE — Telephone Encounter (Signed)
Fax received needing information  about renewal of OCREVUS (financials for pt, and then insurance). LMVM for pt ot return call.

## 2023-02-01 NOTE — Telephone Encounter (Signed)
I called pt and relayed that received a fax for the [employer].  They are needing information about her financials / household.  I mailed her form but she also will call them.  I gave her the #. She appreciated call back.  She is due 08-04-2023 next ocrevus infusion.

## 2023-02-03 ENCOUNTER — Ambulatory Visit (INDEPENDENT_AMBULATORY_CARE_PROVIDER_SITE_OTHER): Payer: 59 | Admitting: Podiatry

## 2023-02-03 DIAGNOSIS — Z01818 Encounter for other preprocedural examination: Secondary | ICD-10-CM | POA: Diagnosis not present

## 2023-02-03 DIAGNOSIS — L989 Disorder of the skin and subcutaneous tissue, unspecified: Secondary | ICD-10-CM | POA: Diagnosis not present

## 2023-02-03 NOTE — Progress Notes (Signed)
Subjective:  Patient ID: [name], female    DOB: [birth year],  MRN: [id number]  Chief Complaint  Patient presents with   Foot Pain    62 y.o. female presents with the above complaint.  Patient presents with right midfoot benign skin lesion painful to touch is progressive gotten worse worse with ambulation worse with pressure she would like to have it removed.  She has not seen anyone else prior to seeing me.  She has tried offloading padding protecting none of which has helped.  She is a diabetic.  Pain scale 5 out of 10.   Review of Systems: Negative except as noted in the HPI. Denies N/V/F/Ch.  Past Medical History:  Diagnosis Date   Acute cystitis    Acute metabolic encephalopathy    Anemia    Arthritis    Chronic pain    Diabetes mellitus    Fibromyalgia    GERD (gastroesophageal reflux disease)    Hepatitis C    received treatment   History of methicillin resistant staphylococcus aureus (MRSA)    Hypercholesteremia    Hypertension    IBS (irritable bowel syndrome)    Multiple sclerosis exacerbation (HCC)    Neurogenic bladder    pt denies having to cath herself-says she voids on her own as of 11-21-21   Neuropathy    Obesity    Sacral nerve stimulator present    battery is dead and has not worked in years   Stroke [facility])    pt denies this but pcp has it in their history   Tachycardia    UTI (urinary tract infection)    Vitamin D deficiency     Current Outpatient Medications:    Accu-Chek Softclix Lancets lancets, Use as directed up to 4 times daily (Patient not taking: Reported on 07/06/2022), Disp: 100 each, Rfl: 5   amitriptyline (ELAVIL) 25 MG tablet, TAKE 1 TABLET(25 MG) BY MOUTH AT BEDTIME, Disp: 30 tablet, Rfl: 5   amLODipine (NORVASC) 10 MG tablet, Take 1 tablet (10 mg total) by mouth daily., Disp: , Rfl:    atorvastatin (LIPITOR) 80 MG tablet, Take 1 tablet (80 mg total) by mouth daily. (Patient taking differently: Take 80 mg by mouth every evening.),  Disp: 90 tablet, Rfl: 3   Blood Glucose Monitoring Suppl (BLOOD GLUCOSE MONITOR SYSTEM) w/Device KIT, use as directed (Patient not taking: Reported on 03/25/2022), Disp: 1 kit, Rfl: 0   Cholecalciferol (VITAMIN D-3) 125 MCG (5000 UT) TABS, Take 5,000 Units by mouth in the morning., Disp: , Rfl:    collagenase (SANTYL) 250 UNIT/GM ointment, Apply 1 Application topically daily., Disp: 15 g, Rfl: 0   dicyclomine (BENTYL) 20 MG tablet, Take 40 mg by mouth 2 (two) times daily., Disp: , Rfl:    diphenoxylate-atropine (LOMOTIL) 2.5-0.025 MG tablet, Take 3 tablets by mouth 2 (two) times daily., Disp: , Rfl:    DULoxetine (CYMBALTA) 60 MG capsule, Take 60 mg by mouth 2 (two) times daily., Disp: , Rfl:    eszopiclone (LUNESTA) 1 MG TABS tablet, Take 1 mg by mouth at bedtime., Disp: , Rfl:    famotidine (PEPCID) 40 MG tablet, Take 40 mg by mouth 2 (two) times daily., Disp: , Rfl:    fluticasone (FLONASE) 50 MCG/ACT nasal spray, Place 2 sprays into the nose daily. (Patient taking differently: Place 2 sprays into both nostrils in the morning and at bedtime.), Disp: 16 g, Rfl: 11   gabapentin (NEURONTIN) 100 MG capsule, Take 100 mg by mouth  2 (two) times daily., Disp: , Rfl:    glucose blood (ACCU-CHEK GUIDE) test strip, Use as instructed up to 4 times daily (Patient not taking: Reported on 03/25/2022), Disp: 100 each, Rfl: 12   Homeopathic Products (LEG CRAMPS PO), Place 3 tablets under the tongue daily as needed. 3 am tablets and 3 pm tablets., Disp: , Rfl:    ibuprofen (ADVIL) 800 MG tablet, Take 800 mg by mouth 3 (three) times daily., Disp: , Rfl:    Insulin Pen Needle 32G X 4 MM MISC, use as directed 4 times daily (Patient not taking: Reported on 03/25/2022), Disp: 200 each, Rfl: 2   lamoTRIgine (LAMICTAL) 200 MG tablet, TAKE 1 TABLET BY MOUTH TWICE DAILY, Disp: 60 tablet, Rfl: 5   lansoprazole (PREVACID) 30 MG capsule, Take 30 mg by mouth 2 (two) times daily before a meal., Disp: , Rfl:    loperamide (IMODIUM)  2 MG capsule, Take 1 capsule (2 mg total) by mouth 2 (two) times daily., Disp: 30 capsule, Rfl: 0   losartan (COZAAR) 100 MG tablet, Take 1 tablet (100 mg total) by mouth daily. (Patient not taking: Reported on 01/20/2023), Disp: 90 tablet, Rfl: 3   losartan (COZAAR) 50 MG tablet, Take 100 mg by mouth daily. Take 2 tablets daily for total of 100 mg, Disp: , Rfl:    MAGNESIUM-OXIDE 400 (240 Mg) MG tablet, Take 400 mg by mouth daily., Disp: , Rfl:    MELATONIN MAXIMUM STRENGTH 5 MG TABS, Take 5 mg by mouth at bedtime., Disp: , Rfl:    montelukast (SINGULAIR) 10 MG tablet, Take 10 mg by mouth at bedtime., Disp: , Rfl:    NARCAN 4 MG/0.1ML LIQD nasal spray kit, Place 1 spray into the nose once as needed (overdose). (Patient not taking: Reported on 12/07/2022), Disp: , Rfl:    nystatin cream (MYCOSTATIN), Apply 1 Application topically 2 (two) times daily., Disp: , Rfl:    ocrelizumab (OCREVUS) 300 MG/10ML injection, Inject 20 mLs (600 mg total) into the vein every 6 (six) months., Disp: 20 mL, Rfl: 1   Oxcarbazepine (TRILEPTAL) 300 MG tablet, TAKE 1 TABLET(300 MG) BY MOUTH TWICE DAILY, Disp: 60 tablet, Rfl: 5   oxyCODONE (ROXICODONE) 15 MG immediate release tablet, Take 15 mg by mouth every 4 (four) hours as needed for pain. May take up to a total of 6 times per day if needed, Disp: , Rfl:    pantoprazole (PROTONIX) 40 MG tablet, Take 40 mg by mouth 2 (two) times daily., Disp: , Rfl:    tirzepatide (MOUNJARO) 7.5 MG/0.5ML Pen, Inject 7.5 mg into the skin once a week. (Patient not taking: Reported on 01/20/2023), Disp: , Rfl:    tiZANidine (ZANAFLEX) 2 MG tablet, One po qAM, one po qPM, two po qHS, Disp: 120 tablet, Rfl: 11   VIBERZI 100 MG TABS, Take 100 mg by mouth 2 (two) times daily., Disp: , Rfl:    vitamin B-12 (CYANOCOBALAMIN) 500 MCG tablet, Take 500 mcg by mouth in the morning., Disp: , Rfl:    XIIDRA 5 % SOLN, Place 2 drops into both eyes in the morning and at bedtime. (Patient not taking: Reported on  01/20/2023), Disp: , Rfl:   Social History   Tobacco Use  Smoking Status Former   Current packs/day: 0.00   Average packs/day: 0.3 packs/day for 12.0 years (3.0 ttl pk-yrs)   Types: Cigarettes   Start date: 03/2009   Quit date: 03/2021   Years since quitting: 1.8  Smokeless Tobacco  Never  Tobacco Comments   06/13/20 1 pk per 3 days    No Known Allergies Objective:  There were no vitals filed for this visit. There is no height or weight on file to calculate BMI. Constitutional Well developed. Well nourished.  Vascular Dorsalis pedis pulses palpable bilaterally. Posterior tibial pulses palpable bilaterally. Capillary refill normal to all digits.  No cyanosis or clubbing noted. Pedal hair growth normal.  Neurologic Normal speech. Oriented to person, place, and time. Epicritic sensation to light touch grossly present bilaterally.  Dermatologic Right plantar midfoot benign skin lesion noted with central nucleated core pain on palpation to the lesion.  Orthopedic: Normal joint ROM without pain or crepitus bilaterally. No visible deformities. No bony tenderness.   Radiographs: None Assessment:   1. Benign skin lesion   2. Encounter for preoperative examination for general surgical procedure    Plan:  Patient was evaluated and treated and all questions answered.  Right plantar midfoot benign skin lesion -All questions and concerns were discussed with the patient in extensive detail given the amount of pain that she is having she will benefit from excision of the benign skin lesion.  I discussed with her that she is at high risk of wound complication given that she is a diabetic.  She states understanding would like to proceed despite the risks.  I encouraged her to be nonweightbearing to the right lower extremity she states understanding will do so.  Her last A1c was 5.5.  No follow-ups on file.

## 2023-02-10 ENCOUNTER — Telehealth: Payer: Self-pay | Admitting: Podiatry

## 2023-02-10 NOTE — Telephone Encounter (Addendum)
DOS - 03/08/2023  EXC. BENIGN LESION RT - [id number]  [employer] EFFECTIVE DATE- 05/18/2022  DEDUCTIBLE- $240.00 WITH REMAINING $0.00 OOP- $8850.00 WITH REMAINING $7642.61 COINSURANCE- 20%  PER THE [employer] WEBSITE PORTAL, PRIOR AUTHORIZATION IS NOT REQUIRED FOR CPT CODE 16109. GOOD FROM 03/08/2023-05/18/2023.  AUTHORIZATION Decision ID #: [id number]  PER THE [employer] WEBSITE PORTAL, PRIOR AUTHORIZATION IS NOT REQUIRED FOR CPT CODE 91478. GOOD FROM 03/08/2023 - 05/18/2023  AUTH Decision ID #: [id number]

## 2023-02-17 ENCOUNTER — Telehealth: Payer: Self-pay | Admitting: Podiatry

## 2023-02-17 NOTE — Telephone Encounter (Signed)
pt called in to see if she can come to do and xray she bas a bunion on her foot that she  would like for you to take of the day you do her surgery is it possible for you to do this the surgery is on 21 of this month

## 2023-02-24 ENCOUNTER — Encounter: Payer: Self-pay | Admitting: Podiatry

## 2023-02-24 ENCOUNTER — Ambulatory Visit (INDEPENDENT_AMBULATORY_CARE_PROVIDER_SITE_OTHER): Payer: 59 | Admitting: Podiatry

## 2023-02-24 ENCOUNTER — Ambulatory Visit (INDEPENDENT_AMBULATORY_CARE_PROVIDER_SITE_OTHER): Payer: 59

## 2023-02-24 VITALS — BP 142/86 | HR 98

## 2023-02-24 DIAGNOSIS — L989 Disorder of the skin and subcutaneous tissue, unspecified: Secondary | ICD-10-CM | POA: Diagnosis not present

## 2023-02-24 DIAGNOSIS — R2689 Other abnormalities of gait and mobility: Secondary | ICD-10-CM

## 2023-02-24 DIAGNOSIS — Z01818 Encounter for other preprocedural examination: Secondary | ICD-10-CM

## 2023-02-24 DIAGNOSIS — M2021 Hallux rigidus, right foot: Secondary | ICD-10-CM

## 2023-02-24 NOTE — Progress Notes (Signed)
Subjective:  Patient ID: [name], female    DOB: 1960-09-19,  MRN: [id number]  Chief Complaint  Patient presents with   Routine Post Op    wants a surgery consulation for Bunion removal PATIENT WANTS TO KNOW IF SHE CAN GET ONE BUNION TOOK OFF WHILE SHE HAS SURGERY,  PATIENT ALSO WILL LIKE A REFERRAL FOR A WALKER     62 y.o. female presents with the above complaint.  Patient presents with right first metatarsophalangeal joint arthritis.  Patient wants to add that out for surgery as well because she has failed all conservative care including shoe gear modification padding injection she would like to surgically take care of this as well.   Review of Systems: Negative except as noted in the HPI. Denies N/V/F/Ch.  Past Medical History:  Diagnosis Date   Acute cystitis    Acute metabolic encephalopathy    Anemia    Arthritis    Chronic pain    Diabetes mellitus    Fibromyalgia    GERD (gastroesophageal reflux disease)    Hepatitis C    received treatment   History of methicillin resistant staphylococcus aureus (MRSA)    Hypercholesteremia    Hypertension    IBS (irritable bowel syndrome)    Multiple sclerosis exacerbation (HCC)    Neurogenic bladder    pt denies having to cath herself-says she voids on her own as of 11-21-21   Neuropathy    Obesity    Sacral nerve stimulator present    battery is dead and has not worked in years   Stroke [facility])    pt denies this but pcp has it in their history   Tachycardia    UTI (urinary tract infection)    Vitamin D deficiency     Current Outpatient Medications:    Accu-Chek Softclix Lancets lancets, Use as directed up to 4 times daily, Disp: 100 each, Rfl: 5   amitriptyline (ELAVIL) 25 MG tablet, TAKE 1 TABLET(25 MG) BY MOUTH AT BEDTIME, Disp: 30 tablet, Rfl: 5   amLODipine (NORVASC) 10 MG tablet, Take 1 tablet (10 mg total) by mouth daily., Disp: , Rfl:    atorvastatin (LIPITOR) 80 MG tablet, Take 1 tablet (80 mg total) by mouth  daily. (Patient taking differently: Take 80 mg by mouth every evening.), Disp: 90 tablet, Rfl: 3   Blood Glucose Monitoring Suppl (BLOOD GLUCOSE MONITOR SYSTEM) w/Device KIT, use as directed, Disp: 1 kit, Rfl: 0   Cholecalciferol (VITAMIN D-3) 125 MCG (5000 UT) TABS, Take 5,000 Units by mouth in the morning., Disp: , Rfl:    collagenase (SANTYL) 250 UNIT/GM ointment, Apply 1 Application topically daily., Disp: 15 g, Rfl: 0   dicyclomine (BENTYL) 20 MG tablet, Take 40 mg by mouth 2 (two) times daily., Disp: , Rfl:    diphenoxylate-atropine (LOMOTIL) 2.5-0.025 MG tablet, Take 3 tablets by mouth 2 (two) times daily., Disp: , Rfl:    DULoxetine (CYMBALTA) 60 MG capsule, Take 60 mg by mouth 2 (two) times daily., Disp: , Rfl:    eszopiclone (LUNESTA) 1 MG TABS tablet, Take 1 mg by mouth at bedtime., Disp: , Rfl:    famotidine (PEPCID) 40 MG tablet, Take 40 mg by mouth 2 (two) times daily., Disp: , Rfl:    fluticasone (FLONASE) 50 MCG/ACT nasal spray, Place 2 sprays into the nose daily. (Patient taking differently: Place 2 sprays into both nostrils in the morning and at bedtime.), Disp: 16 g, Rfl: 11   gabapentin (NEURONTIN) 100 MG  capsule, Take 100 mg by mouth 2 (two) times daily., Disp: , Rfl:    glucose blood (ACCU-CHEK GUIDE) test strip, Use as instructed up to 4 times daily, Disp: 100 each, Rfl: 12   Homeopathic Products (LEG CRAMPS PO), Place 3 tablets under the tongue daily as needed. 3 am tablets and 3 pm tablets., Disp: , Rfl:    ibuprofen (ADVIL) 800 MG tablet, Take 800 mg by mouth 3 (three) times daily., Disp: , Rfl:    Insulin Pen Needle 32G X 4 MM MISC, use as directed 4 times daily, Disp: 200 each, Rfl: 2   lamoTRIgine (LAMICTAL) 200 MG tablet, TAKE 1 TABLET BY MOUTH TWICE DAILY, Disp: 60 tablet, Rfl: 5   lansoprazole (PREVACID) 30 MG capsule, Take 30 mg by mouth 2 (two) times daily before a meal., Disp: , Rfl:    loperamide (IMODIUM) 2 MG capsule, Take 1 capsule (2 mg total) by mouth 2 (two)  times daily., Disp: 30 capsule, Rfl: 0   losartan (COZAAR) 100 MG tablet, Take 1 tablet (100 mg total) by mouth daily., Disp: 90 tablet, Rfl: 3   losartan (COZAAR) 50 MG tablet, Take 100 mg by mouth daily. Take 2 tablets daily for total of 100 mg, Disp: , Rfl:    MAGNESIUM-OXIDE 400 (240 Mg) MG tablet, Take 400 mg by mouth daily., Disp: , Rfl:    MELATONIN MAXIMUM STRENGTH 5 MG TABS, Take 5 mg by mouth at bedtime., Disp: , Rfl:    montelukast (SINGULAIR) 10 MG tablet, Take 10 mg by mouth at bedtime., Disp: , Rfl:    NARCAN 4 MG/0.1ML LIQD nasal spray kit, Place 1 spray into the nose once as needed (overdose)., Disp: , Rfl:    nystatin cream (MYCOSTATIN), Apply 1 Application topically 2 (two) times daily., Disp: , Rfl:    ocrelizumab (OCREVUS) 300 MG/10ML injection, Inject 20 mLs (600 mg total) into the vein every 6 (six) months., Disp: 20 mL, Rfl: 1   Oxcarbazepine (TRILEPTAL) 300 MG tablet, TAKE 1 TABLET(300 MG) BY MOUTH TWICE DAILY, Disp: 60 tablet, Rfl: 5   oxyCODONE (ROXICODONE) 15 MG immediate release tablet, Take 15 mg by mouth every 4 (four) hours as needed for pain. May take up to a total of 6 times per day if needed, Disp: , Rfl:    pantoprazole (PROTONIX) 40 MG tablet, Take 40 mg by mouth 2 (two) times daily., Disp: , Rfl:    tirzepatide (MOUNJARO) 7.5 MG/0.5ML Pen, Inject 7.5 mg into the skin once a week., Disp: , Rfl:    tiZANidine (ZANAFLEX) 2 MG tablet, One po qAM, one po qPM, two po qHS, Disp: 120 tablet, Rfl: 11   VIBERZI 100 MG TABS, Take 100 mg by mouth 2 (two) times daily., Disp: , Rfl:    vitamin B-12 (CYANOCOBALAMIN) 500 MCG tablet, Take 500 mcg by mouth in the morning., Disp: , Rfl:    XIIDRA 5 % SOLN, Place 2 drops into both eyes in the morning and at bedtime., Disp: , Rfl:    ibuprofen (ADVIL) 800 MG tablet, Take 1 tablet (800 mg total) by mouth every 6 (six) hours as needed., Disp: 60 tablet, Rfl: 1   oxyCODONE-acetaminophen (PERCOCET) 5-325 MG tablet, Take 1 tablet by  mouth every 4 (four) hours as needed for severe pain (pain score 7-10)., Disp: 30 tablet, Rfl: 0  Social History   Tobacco Use  Smoking Status Former   Current packs/day: 0.00   Average packs/day: 0.3 packs/day for 12.0 years (3.0  ttl pk-yrs)   Types: Cigarettes   Start date: 03/2009   Quit date: 03/2021   Years since quitting: 1.9  Smokeless Tobacco Never  Tobacco Comments   06/13/20 1 pk per 3 days    No Known Allergies Objective:   Vitals:   02/24/23 1539  BP: (!) 142/86  Pulse: 98   There is no height or weight on file to calculate BMI. Constitutional Well developed. Well nourished.  Vascular Dorsalis pedis pulses palpable bilaterally. Posterior tibial pulses palpable bilaterally. Capillary refill normal to all digits.  No cyanosis or clubbing noted. Pedal hair growth normal.  Neurologic Normal speech. Oriented to person, place, and time. Epicritic sensation to light touch grossly present bilaterally.  Dermatologic Right plantar midfoot benign skin lesion noted with central nucleated core pain on palpation to the lesion.  Pain on palpation to the right first metatarsophalangeal joint limited range of motion noted.  Pain at the end range of motion deep intra-articular pain noted crepitus noted.  Orthopedic: Normal joint ROM without pain or crepitus bilaterally. No visible deformities. No bony tenderness.   Radiographs: 3 views of skeletally mature the right foot osteoarthritis noted of the first metatarsophalangeal joint with uneven joint space narrowing sclerosis at the base of the proximal phalanx osteophytes noted.  No other bony abnormalities identified Assessment:   1. Hallux rigidus of right foot   2. Benign skin lesion   3. Antalgic gait    Plan:  Patient was evaluated and treated and all questions answered.  Right hallux rigididus -I explained to the patient the etiology of hallux rigidus.  Various treatment options were discussed including surgical  fusion versus arthroplasty.  Patient elected to undergo arthroplasty at this time.  She would like to maintain the range of motion.  Given that she has failed all conservative care I believe she would benefit from right hallux metatarsophalangeal joint arthroplasty with Silastic implant I discussed my preoperative intra postop plan with the patient in extensive detail she states understanding like to proceed with surgery  Right plantar midfoot benign skin lesion -All questions and concerns were discussed with the patient in extensive detail given the amount of pain that she is having she will benefit from excision of the benign skin lesion.  I discussed with her that she is at high risk of wound complication given that she is a diabetic.  She states understanding would like to proceed despite the risks.  I encouraged her to be nonweightbearing to the right lower extremity she states understanding will do so.  Her last A1c was 5.5.  No follow-ups on file.

## 2023-03-08 ENCOUNTER — Other Ambulatory Visit: Payer: Self-pay | Admitting: Podiatry

## 2023-03-08 DIAGNOSIS — D2371 Other benign neoplasm of skin of right lower limb, including hip: Secondary | ICD-10-CM | POA: Diagnosis not present

## 2023-03-08 DIAGNOSIS — M2021 Hallux rigidus, right foot: Secondary | ICD-10-CM | POA: Diagnosis not present

## 2023-03-08 MED ORDER — OXYCODONE-ACETAMINOPHEN 5-325 MG PO TABS: 1.0000 | ORAL_TABLET | ORAL | 0 refills | Status: DC | PRN

## 2023-03-08 MED ORDER — IBUPROFEN 800 MG PO TABS: 800.0000 mg | ORAL_TABLET | Freq: Four times a day (QID) | ORAL | 1 refills | Status: AC | PRN

## 2023-03-17 ENCOUNTER — Encounter: Payer: Self-pay | Admitting: Podiatry

## 2023-03-17 ENCOUNTER — Ambulatory Visit (INDEPENDENT_AMBULATORY_CARE_PROVIDER_SITE_OTHER): Payer: 59 | Admitting: Podiatry

## 2023-03-17 ENCOUNTER — Ambulatory Visit (INDEPENDENT_AMBULATORY_CARE_PROVIDER_SITE_OTHER): Payer: 59

## 2023-03-17 DIAGNOSIS — M778 Other enthesopathies, not elsewhere classified: Secondary | ICD-10-CM

## 2023-03-17 DIAGNOSIS — M2021 Hallux rigidus, right foot: Secondary | ICD-10-CM

## 2023-03-17 DIAGNOSIS — L989 Disorder of the skin and subcutaneous tissue, unspecified: Secondary | ICD-10-CM

## 2023-03-17 DIAGNOSIS — Z9889 Other specified postprocedural states: Secondary | ICD-10-CM

## 2023-03-17 NOTE — Progress Notes (Signed)
Subjective:  Patient ID: [name], female    DOB: Jun 13, 1960,  MRN: [id number]  Chief Complaint  Patient presents with   Routine Post Op    POV#1 Patient states os doing well minimal pain. Incision looks to be opening up on the bottom of her foot with some drainage.    DOS: 03/08/2023 Procedure: Right first MPJ arthroplasty with excision of benign skin lesion  62 y.o. female returns for post-op check.  Patient states that he is doing well.  Denies any other acute complaints.  She states that she has been keeping it as clean dry intact has been putting some weight down with a heel on.  Review of Systems: Negative except as noted in the HPI. Denies N/V/F/Ch.  Past Medical History:  Diagnosis Date   Acute cystitis    Acute metabolic encephalopathy    Anemia    Arthritis    Chronic pain    Diabetes mellitus    Fibromyalgia    GERD (gastroesophageal reflux disease)    Hepatitis C    received treatment   History of methicillin resistant staphylococcus aureus (MRSA)    Hypercholesteremia    Hypertension    IBS (irritable bowel syndrome)    Multiple sclerosis exacerbation (HCC)    Neurogenic bladder    pt denies having to cath herself-says she voids on her own as of 11-21-21   Neuropathy    Obesity    Sacral nerve stimulator present    battery is dead and has not worked in years   Stroke [facility])    pt denies this but pcp has it in their history   Tachycardia    UTI (urinary tract infection)    Vitamin D deficiency     Current Outpatient Medications:    amitriptyline (ELAVIL) 25 MG tablet, TAKE 1 TABLET(25 MG) BY MOUTH AT BEDTIME, Disp: 30 tablet, Rfl: 5   amLODipine (NORVASC) 10 MG tablet, Take 1 tablet (10 mg total) by mouth daily., Disp: , Rfl:    atorvastatin (LIPITOR) 80 MG tablet, Take 1 tablet (80 mg total) by mouth daily. (Patient taking differently: Take 80 mg by mouth every evening.), Disp: 90 tablet, Rfl: 3   Cholecalciferol (VITAMIN D-3) 125 MCG (5000 UT) TABS,  Take 5,000 Units by mouth in the morning., Disp: , Rfl:    collagenase (SANTYL) 250 UNIT/GM ointment, Apply 1 Application topically daily., Disp: 15 g, Rfl: 0   dicyclomine (BENTYL) 20 MG tablet, Take 40 mg by mouth 2 (two) times daily., Disp: , Rfl:    diphenoxylate-atropine (LOMOTIL) 2.5-0.025 MG tablet, Take 3 tablets by mouth 2 (two) times daily., Disp: , Rfl:    DULoxetine (CYMBALTA) 60 MG capsule, Take 60 mg by mouth 2 (two) times daily., Disp: , Rfl:    eszopiclone (LUNESTA) 1 MG TABS tablet, Take 1 mg by mouth at bedtime., Disp: , Rfl:    famotidine (PEPCID) 40 MG tablet, Take 40 mg by mouth 2 (two) times daily., Disp: , Rfl:    fluticasone (FLONASE) 50 MCG/ACT nasal spray, Place 2 sprays into the nose daily. (Patient taking differently: Place 2 sprays into both nostrils in the morning and at bedtime.), Disp: 16 g, Rfl: 11   gabapentin (NEURONTIN) 100 MG capsule, Take 100 mg by mouth 2 (two) times daily., Disp: , Rfl:    Homeopathic Products (LEG CRAMPS PO), Place 3 tablets under the tongue daily as needed. 3 am tablets and 3 pm tablets., Disp: , Rfl:    ibuprofen (ADVIL)  800 MG tablet, Take 800 mg by mouth 3 (three) times daily., Disp: , Rfl:    ibuprofen (ADVIL) 800 MG tablet, Take 1 tablet (800 mg total) by mouth every 6 (six) hours as needed., Disp: 60 tablet, Rfl: 1   lamoTRIgine (LAMICTAL) 200 MG tablet, TAKE 1 TABLET BY MOUTH TWICE DAILY, Disp: 60 tablet, Rfl: 5   lansoprazole (PREVACID) 30 MG capsule, Take 30 mg by mouth 2 (two) times daily before a meal., Disp: , Rfl:    loperamide (IMODIUM) 2 MG capsule, Take 1 capsule (2 mg total) by mouth 2 (two) times daily., Disp: 30 capsule, Rfl: 0   losartan (COZAAR) 50 MG tablet, Take 100 mg by mouth daily. Take 2 tablets daily for total of 100 mg, Disp: , Rfl:    MAGNESIUM-OXIDE 400 (240 Mg) MG tablet, Take 400 mg by mouth daily., Disp: , Rfl:    MELATONIN MAXIMUM STRENGTH 5 MG TABS, Take 5 mg by mouth at bedtime., Disp: , Rfl:     montelukast (SINGULAIR) 10 MG tablet, Take 10 mg by mouth at bedtime., Disp: , Rfl:    NARCAN 4 MG/0.1ML LIQD nasal spray kit, Place 1 spray into the nose once as needed (overdose)., Disp: , Rfl:    nystatin cream (MYCOSTATIN), Apply 1 Application topically 2 (two) times daily., Disp: , Rfl:    ocrelizumab (OCREVUS) 300 MG/10ML injection, Inject 20 mLs (600 mg total) into the vein every 6 (six) months., Disp: 20 mL, Rfl: 1   Oxcarbazepine (TRILEPTAL) 300 MG tablet, TAKE 1 TABLET(300 MG) BY MOUTH TWICE DAILY, Disp: 60 tablet, Rfl: 5   oxyCODONE (ROXICODONE) 15 MG immediate release tablet, Take 15 mg by mouth every 4 (four) hours as needed for pain. May take up to a total of 6 times per day if needed, Disp: , Rfl:    oxyCODONE-acetaminophen (PERCOCET) 5-325 MG tablet, Take 1 tablet by mouth every 4 (four) hours as needed for severe pain (pain score 7-10)., Disp: 30 tablet, Rfl: 0   pantoprazole (PROTONIX) 40 MG tablet, Take 40 mg by mouth 2 (two) times daily., Disp: , Rfl:    tirzepatide (MOUNJARO) 7.5 MG/0.5ML Pen, Inject 7.5 mg into the skin once a week., Disp: , Rfl:    tiZANidine (ZANAFLEX) 2 MG tablet, One po qAM, one po qPM, two po qHS, Disp: 120 tablet, Rfl: 11   VIBERZI 100 MG TABS, Take 100 mg by mouth 2 (two) times daily., Disp: , Rfl:    vitamin B-12 (CYANOCOBALAMIN) 500 MCG tablet, Take 500 mcg by mouth in the morning., Disp: , Rfl:    Accu-Chek Softclix Lancets lancets, Use as directed up to 4 times daily, Disp: 100 each, Rfl: 5   Blood Glucose Monitoring Suppl (BLOOD GLUCOSE MONITOR SYSTEM) w/Device KIT, use as directed, Disp: 1 kit, Rfl: 0   glucose blood (ACCU-CHEK GUIDE) test strip, Use as instructed up to 4 times daily, Disp: 100 each, Rfl: 12   Insulin Pen Needle 32G X 4 MM MISC, use as directed 4 times daily, Disp: 200 each, Rfl: 2   losartan (COZAAR) 100 MG tablet, Take 1 tablet (100 mg total) by mouth daily., Disp: 90 tablet, Rfl: 3   XIIDRA 5 % SOLN, Place 2 drops into both eyes  in the morning and at bedtime., Disp: , Rfl:   Social History   Tobacco Use  Smoking Status Former   Current packs/day: 0.00   Average packs/day: 0.3 packs/day for 12.0 years (3.0 ttl pk-yrs)   Types: Cigarettes  Start date: 03/2009   Quit date: 03/2021   Years since quitting: 1.9  Smokeless Tobacco Never  Tobacco Comments   06/13/20 1 pk per 3 days    No Known Allergies Objective:  There were no vitals filed for this visit. There is no height or weight on file to calculate BMI. Constitutional Well developed. Well nourished.  Vascular Foot warm and well perfused. Capillary refill normal to all digits.   Neurologic Normal speech. Oriented to person, place, and time. Epicritic sensation to light touch grossly present bilaterally.  Dermatologic Skin healing well without signs of infection. Skin edges well coapted without signs of infection.  Orthopedic: Tenderness to palpation noted about the surgical site.   Radiographs: 3 views of skeletally mature the right foot: Status post arthroplasty of the first MPJ in good position. Assessment:   1. Status post foot surgery   2. Hallux rigidus of right foot   3. Benign skin lesion    Plan:  Patient was evaluated and treated and all questions answered.  S/p foot surgery right -Progressing as expected post-operatively. -XR: See above -WB Status: Partial weightbearing to the heel in cam boot -Sutures: Intact.  No clinical signs of dehiscence noted no complication noted. -Medications: None -Foot redressed.  No follow-ups on file.

## 2023-03-31 ENCOUNTER — Encounter: Payer: 59 | Admitting: Podiatry

## 2023-04-02 ENCOUNTER — Encounter: Payer: Self-pay | Admitting: Podiatry

## 2023-04-02 ENCOUNTER — Ambulatory Visit (INDEPENDENT_AMBULATORY_CARE_PROVIDER_SITE_OTHER): Payer: 59 | Admitting: Podiatry

## 2023-04-02 DIAGNOSIS — T8130XA Disruption of wound, unspecified, initial encounter: Secondary | ICD-10-CM

## 2023-04-02 DIAGNOSIS — Z9889 Other specified postprocedural states: Secondary | ICD-10-CM

## 2023-04-02 MED ORDER — SANTYL 250 UNIT/GM EX OINT
1.0000 | TOPICAL_OINTMENT | Freq: Every day | CUTANEOUS | 0 refills | Status: DC
Start: 2023-04-02 — End: 2023-06-26

## 2023-04-02 NOTE — Progress Notes (Signed)
Subjective:  Patient ID: [name], female    DOB: 16-Mar-1961,  MRN: [id number]  Chief Complaint  Patient presents with   Routine Post Op    PATIENT STATES THAT THE SIDE OF HER RF IS STARTING TO HURT FROM THE BOOT , PATIENT STATES SHE THINKS THE RIGHT LEG IS MAKING THE LEFT LEG TIRED FROM WALKING . PATIENT STATES THAT SHE IS TAKING MEDICATION FOR PAIN     DOS: 03/08/2023 Procedure: Right first MPJ arthroplasty with excision of benign skin lesion  62 y.o. female returns for post-op check.  Patient states that he is doing well.  Denies any other acute complaints.  She states that she has been keeping it as clean dry intact has been putting some weight down with a heel on.  Review of Systems: Negative except as noted in the HPI. Denies N/V/F/Ch.  Past Medical History:  Diagnosis Date   Acute cystitis    Acute metabolic encephalopathy    Anemia    Arthritis    Chronic pain    Diabetes mellitus    Fibromyalgia    GERD (gastroesophageal reflux disease)    Hepatitis C    received treatment   History of methicillin resistant staphylococcus aureus (MRSA)    Hypercholesteremia    Hypertension    IBS (irritable bowel syndrome)    Multiple sclerosis exacerbation (HCC)    Neurogenic bladder    pt denies having to cath herself-says she voids on her own as of 11-21-21   Neuropathy    Obesity    Sacral nerve stimulator present    battery is dead and has not worked in years   Stroke [facility])    pt denies this but pcp has it in their history   Tachycardia    UTI (urinary tract infection)    Vitamin D deficiency     Current Outpatient Medications:    amitriptyline (ELAVIL) 25 MG tablet, TAKE 1 TABLET(25 MG) BY MOUTH AT BEDTIME, Disp: 30 tablet, Rfl: 5   amLODipine (NORVASC) 10 MG tablet, Take 1 tablet (10 mg total) by mouth daily., Disp: , Rfl:    atorvastatin (LIPITOR) 80 MG tablet, Take 1 tablet (80 mg total) by mouth daily. (Patient taking differently: Take 80 mg by mouth every  evening.), Disp: 90 tablet, Rfl: 3   Cholecalciferol (VITAMIN D-3) 125 MCG (5000 UT) TABS, Take 5,000 Units by mouth in the morning., Disp: , Rfl:    collagenase (SANTYL) 250 UNIT/GM ointment, Apply 1 Application topically daily., Disp: 15 g, Rfl: 0   dicyclomine (BENTYL) 20 MG tablet, Take 40 mg by mouth 2 (two) times daily., Disp: , Rfl:    diphenoxylate-atropine (LOMOTIL) 2.5-0.025 MG tablet, Take 3 tablets by mouth 2 (two) times daily., Disp: , Rfl:    DULoxetine (CYMBALTA) 60 MG capsule, Take 60 mg by mouth 2 (two) times daily., Disp: , Rfl:    eszopiclone (LUNESTA) 1 MG TABS tablet, Take 1 mg by mouth at bedtime., Disp: , Rfl:    famotidine (PEPCID) 40 MG tablet, Take 40 mg by mouth 2 (two) times daily., Disp: , Rfl:    fluticasone (FLONASE) 50 MCG/ACT nasal spray, Place 2 sprays into the nose daily. (Patient taking differently: Place 2 sprays into both nostrils in the morning and at bedtime.), Disp: 16 g, Rfl: 11   gabapentin (NEURONTIN) 100 MG capsule, Take 100 mg by mouth 2 (two) times daily., Disp: , Rfl:    Homeopathic Products (LEG CRAMPS PO), Place 3 tablets under the  tongue daily as needed. 3 am tablets and 3 pm tablets., Disp: , Rfl:    ibuprofen (ADVIL) 800 MG tablet, Take 800 mg by mouth 3 (three) times daily., Disp: , Rfl:    ibuprofen (ADVIL) 800 MG tablet, Take 1 tablet (800 mg total) by mouth every 6 (six) hours as needed., Disp: 60 tablet, Rfl: 1   lamoTRIgine (LAMICTAL) 200 MG tablet, TAKE 1 TABLET BY MOUTH TWICE DAILY, Disp: 60 tablet, Rfl: 5   lansoprazole (PREVACID) 30 MG capsule, Take 30 mg by mouth 2 (two) times daily before a meal., Disp: , Rfl:    loperamide (IMODIUM) 2 MG capsule, Take 1 capsule (2 mg total) by mouth 2 (two) times daily., Disp: 30 capsule, Rfl: 0   losartan (COZAAR) 50 MG tablet, Take 100 mg by mouth daily. Take 2 tablets daily for total of 100 mg, Disp: , Rfl:    MAGNESIUM-OXIDE 400 (240 Mg) MG tablet, Take 400 mg by mouth daily., Disp: , Rfl:     MELATONIN MAXIMUM STRENGTH 5 MG TABS, Take 5 mg by mouth at bedtime., Disp: , Rfl:    montelukast (SINGULAIR) 10 MG tablet, Take 10 mg by mouth at bedtime., Disp: , Rfl:    NARCAN 4 MG/0.1ML LIQD nasal spray kit, Place 1 spray into the nose once as needed (overdose)., Disp: , Rfl:    nystatin cream (MYCOSTATIN), Apply 1 Application topically 2 (two) times daily., Disp: , Rfl:    ocrelizumab (OCREVUS) 300 MG/10ML injection, Inject 20 mLs (600 mg total) into the vein every 6 (six) months., Disp: 20 mL, Rfl: 1   Oxcarbazepine (TRILEPTAL) 300 MG tablet, TAKE 1 TABLET(300 MG) BY MOUTH TWICE DAILY, Disp: 60 tablet, Rfl: 5   oxyCODONE (ROXICODONE) 15 MG immediate release tablet, Take 15 mg by mouth every 4 (four) hours as needed for pain. May take up to a total of 6 times per day if needed, Disp: , Rfl:    oxyCODONE-acetaminophen (PERCOCET) 5-325 MG tablet, Take 1 tablet by mouth every 4 (four) hours as needed for severe pain (pain score 7-10)., Disp: 30 tablet, Rfl: 0   pantoprazole (PROTONIX) 40 MG tablet, Take 40 mg by mouth 2 (two) times daily., Disp: , Rfl:    tirzepatide (MOUNJARO) 7.5 MG/0.5ML Pen, Inject 7.5 mg into the skin once a week., Disp: , Rfl:    tiZANidine (ZANAFLEX) 2 MG tablet, One po qAM, one po qPM, two po qHS, Disp: 120 tablet, Rfl: 11   VIBERZI 100 MG TABS, Take 100 mg by mouth 2 (two) times daily., Disp: , Rfl:    vitamin B-12 (CYANOCOBALAMIN) 500 MCG tablet, Take 500 mcg by mouth in the morning., Disp: , Rfl:    Accu-Chek Softclix Lancets lancets, Use as directed up to 4 times daily, Disp: 100 each, Rfl: 5   Blood Glucose Monitoring Suppl (BLOOD GLUCOSE MONITOR SYSTEM) w/Device KIT, use as directed, Disp: 1 kit, Rfl: 0   glucose blood (ACCU-CHEK GUIDE) test strip, Use as instructed up to 4 times daily, Disp: 100 each, Rfl: 12   Insulin Pen Needle 32G X 4 MM MISC, use as directed 4 times daily, Disp: 200 each, Rfl: 2   losartan (COZAAR) 100 MG tablet, Take 1 tablet (100 mg total) by  mouth daily., Disp: 90 tablet, Rfl: 3   XIIDRA 5 % SOLN, Place 2 drops into both eyes in the morning and at bedtime., Disp: , Rfl:   Social History   Tobacco Use  Smoking Status Former  Current packs/day: 0.00   Average packs/day: 0.3 packs/day for 12.0 years (3.0 ttl pk-yrs)   Types: Cigarettes   Start date: 03/2009   Quit date: 03/2021   Years since quitting: 2.0  Smokeless Tobacco Never  Tobacco Comments   06/13/20 1 pk per 3 days    No Known Allergies Objective:  There were no vitals filed for this visit. There is no height or weight on file to calculate BMI. Constitutional Well developed. Well nourished.  Vascular Foot warm and well perfused. Capillary refill normal to all digits.   Neurologic Normal speech. Oriented to person, place, and time. Epicritic sensation to light touch grossly present bilaterally.  Dermatologic Skin healing well without signs of infection. Skin edges well coapted without signs of infection.  Orthopedic: Tenderness to palpation noted about the surgical site.   Radiographs: 3 views of skeletally mature the right foot: Status post arthroplasty of the first MPJ in good position. Assessment:   1. Status post foot surgery   2. Wound dehiscence    Plan:  Patient was evaluated and treated and all questions answered.  S/p foot surgery right -Progressing as expected post-operatively. -XR: See above -WB Status: As tolerated in surgical shoe -Sutures: Removed no clinical signs of dehiscence noted no complication noted. -Medications: None -Official wound dehiscence noted measuring 3 cm x 0.3 cm x 1.5 cm.  Santyl wet-to-dry dressing will be applied Santyl is ordered by the pharmacy  No follow-ups on file.

## 2023-04-27 ENCOUNTER — Telehealth: Payer: Self-pay

## 2023-04-27 MED ORDER — SANTYL 250 UNIT/GM EX OINT: 1.0000 | TOPICAL_OINTMENT | Freq: Every day | CUTANEOUS | 0 refills | Status: DC

## 2023-04-27 NOTE — Telephone Encounter (Signed)
Patient called requesting refill on santyl.

## 2023-04-29 ENCOUNTER — Telehealth: Payer: Self-pay | Admitting: Podiatry

## 2023-04-29 NOTE — Telephone Encounter (Signed)
Patient and pharmacy have called and sent a request for you to put the measurements of the wound for the patient to get her prescription for the topical cream. She wont be able to come and see you for a few days and is at the very end of her current tube.   Thanks

## 2023-05-03 ENCOUNTER — Telehealth: Payer: Self-pay

## 2023-05-03 NOTE — Telephone Encounter (Signed)
Response stated that PA not required at this time. Covered drug on plan.

## 2023-05-03 NOTE — Telephone Encounter (Addendum)
PA request received from [facility] for Santyl 250 unit/gm ointment. PA submitted through covermymeds and waiting on response.  Manual Meier  (Key: [id number])

## 2023-05-05 ENCOUNTER — Ambulatory Visit (INDEPENDENT_AMBULATORY_CARE_PROVIDER_SITE_OTHER): Payer: 59 | Admitting: Podiatry

## 2023-05-05 ENCOUNTER — Ambulatory Visit (INDEPENDENT_AMBULATORY_CARE_PROVIDER_SITE_OTHER): Payer: 59

## 2023-05-05 DIAGNOSIS — Z9889 Other specified postprocedural states: Secondary | ICD-10-CM

## 2023-05-05 DIAGNOSIS — T8130XA Disruption of wound, unspecified, initial encounter: Secondary | ICD-10-CM

## 2023-05-05 MED ORDER — SANTYL 250 UNIT/GM EX OINT: 1.0000 | TOPICAL_OINTMENT | Freq: Every day | CUTANEOUS | 0 refills | Status: AC

## 2023-05-05 NOTE — Progress Notes (Signed)
Subjective:  Patient ID: [name], female    DOB: Sep 24, 1960,  MRN: [id number]  Chief Complaint  Patient presents with   Routine Post Op    POV # 3 DOS 03/08/23 --- RIGHT BENIGN SKIN LESION EXC,     DOS: 03/08/2023 Procedure: Right first MPJ arthroplasty with excision of benign skin lesion  62 y.o. female returns for post-op check.  Patient states that he is doing well.  Denies any other acute complaints.  She states that she has been keeping it as clean dry intact has been putting some weight down with a heel on.  Review of Systems: Negative except as noted in the HPI. Denies N/V/F/Ch.  Past Medical History:  Diagnosis Date   Acute cystitis    Acute metabolic encephalopathy    Anemia    Arthritis    Chronic pain    Diabetes mellitus    Fibromyalgia    GERD (gastroesophageal reflux disease)    Hepatitis C    received treatment   History of methicillin resistant staphylococcus aureus (MRSA)    Hypercholesteremia    Hypertension    IBS (irritable bowel syndrome)    Multiple sclerosis exacerbation (HCC)    Neurogenic bladder    pt denies having to cath herself-says she voids on her own as of 11-21-21   Neuropathy    Obesity    Sacral nerve stimulator present    battery is dead and has not worked in years   Stroke [facility])    pt denies this but pcp has it in their history   Tachycardia    UTI (urinary tract infection)    Vitamin D deficiency     Current Outpatient Medications:    collagenase (SANTYL) 250 UNIT/GM ointment, Apply 1 Application topically daily., Disp: 15 g, Rfl: 0   amitriptyline (ELAVIL) 25 MG tablet, TAKE 1 TABLET(25 MG) BY MOUTH AT BEDTIME, Disp: 30 tablet, Rfl: 5   amLODipine (NORVASC) 10 MG tablet, Take 1 tablet (10 mg total) by mouth daily., Disp: , Rfl:    atorvastatin (LIPITOR) 80 MG tablet, Take 1 tablet (80 mg total) by mouth daily. (Patient taking differently: Take 80 mg by mouth every evening.), Disp: 90 tablet, Rfl: 3   Cholecalciferol  (VITAMIN D-3) 125 MCG (5000 UT) TABS, Take 5,000 Units by mouth in the morning., Disp: , Rfl:    collagenase (SANTYL) 250 UNIT/GM ointment, Apply 1 Application topically daily., Disp: 15 g, Rfl: 0   collagenase (SANTYL) 250 UNIT/GM ointment, Apply 1 Application topically daily., Disp: 15 g, Rfl: 0   collagenase (SANTYL) 250 UNIT/GM ointment, Apply 1 Application topically daily., Disp: 15 g, Rfl: 0   dicyclomine (BENTYL) 20 MG tablet, Take 40 mg by mouth 2 (two) times daily., Disp: , Rfl:    diphenoxylate-atropine (LOMOTIL) 2.5-0.025 MG tablet, Take 3 tablets by mouth 2 (two) times daily., Disp: , Rfl:    DULoxetine (CYMBALTA) 60 MG capsule, Take 60 mg by mouth 2 (two) times daily., Disp: , Rfl:    eszopiclone (LUNESTA) 1 MG TABS tablet, Take 1 mg by mouth at bedtime., Disp: , Rfl:    famotidine (PEPCID) 40 MG tablet, Take 40 mg by mouth 2 (two) times daily., Disp: , Rfl:    fluticasone (FLONASE) 50 MCG/ACT nasal spray, Place 2 sprays into the nose daily. (Patient taking differently: Place 2 sprays into both nostrils in the morning and at bedtime.), Disp: 16 g, Rfl: 11   gabapentin (NEURONTIN) 100 MG capsule, Take 100 mg by  mouth 2 (two) times daily., Disp: , Rfl:    Homeopathic Products (LEG CRAMPS PO), Place 3 tablets under the tongue daily as needed. 3 am tablets and 3 pm tablets., Disp: , Rfl:    ibuprofen (ADVIL) 800 MG tablet, Take 800 mg by mouth 3 (three) times daily., Disp: , Rfl:    ibuprofen (ADVIL) 800 MG tablet, Take 1 tablet (800 mg total) by mouth every 6 (six) hours as needed., Disp: 60 tablet, Rfl: 1   lamoTRIgine (LAMICTAL) 200 MG tablet, TAKE 1 TABLET BY MOUTH TWICE DAILY, Disp: 60 tablet, Rfl: 5   lansoprazole (PREVACID) 30 MG capsule, Take 30 mg by mouth 2 (two) times daily before a meal., Disp: , Rfl:    loperamide (IMODIUM) 2 MG capsule, Take 1 capsule (2 mg total) by mouth 2 (two) times daily., Disp: 30 capsule, Rfl: 0   losartan (COZAAR) 50 MG tablet, Take 100 mg by mouth  daily. Take 2 tablets daily for total of 100 mg, Disp: , Rfl:    MAGNESIUM-OXIDE 400 (240 Mg) MG tablet, Take 400 mg by mouth daily., Disp: , Rfl:    MELATONIN MAXIMUM STRENGTH 5 MG TABS, Take 5 mg by mouth at bedtime., Disp: , Rfl:    montelukast (SINGULAIR) 10 MG tablet, Take 10 mg by mouth at bedtime., Disp: , Rfl:    NARCAN 4 MG/0.1ML LIQD nasal spray kit, Place 1 spray into the nose once as needed (overdose)., Disp: , Rfl:    nystatin cream (MYCOSTATIN), Apply 1 Application topically 2 (two) times daily., Disp: , Rfl:    ocrelizumab (OCREVUS) 300 MG/10ML injection, Inject 20 mLs (600 mg total) into the vein every 6 (six) months., Disp: 20 mL, Rfl: 1   Oxcarbazepine (TRILEPTAL) 300 MG tablet, TAKE 1 TABLET(300 MG) BY MOUTH TWICE DAILY, Disp: 60 tablet, Rfl: 5   oxyCODONE (ROXICODONE) 15 MG immediate release tablet, Take 15 mg by mouth every 4 (four) hours as needed for pain. May take up to a total of 6 times per day if needed, Disp: , Rfl:    oxyCODONE-acetaminophen (PERCOCET) 5-325 MG tablet, Take 1 tablet by mouth every 4 (four) hours as needed for severe pain (pain score 7-10)., Disp: 30 tablet, Rfl: 0   pantoprazole (PROTONIX) 40 MG tablet, Take 40 mg by mouth 2 (two) times daily., Disp: , Rfl:    tirzepatide (MOUNJARO) 7.5 MG/0.5ML Pen, Inject 7.5 mg into the skin once a week., Disp: , Rfl:    tiZANidine (ZANAFLEX) 2 MG tablet, One po qAM, one po qPM, two po qHS, Disp: 120 tablet, Rfl: 11   VIBERZI 100 MG TABS, Take 100 mg by mouth 2 (two) times daily., Disp: , Rfl:    vitamin B-12 (CYANOCOBALAMIN) 500 MCG tablet, Take 500 mcg by mouth in the morning., Disp: , Rfl:   Social History   Tobacco Use  Smoking Status Former   Current packs/day: 0.00   Average packs/day: 0.3 packs/day for 12.0 years (3.0 ttl pk-yrs)   Types: Cigarettes   Start date: 03/2009   Quit date: 03/2021   Years since quitting: 2.1  Smokeless Tobacco Never  Tobacco Comments   06/13/20 1 pk per 3 days    No  Known Allergies Objective:  There were no vitals filed for this visit. There is no height or weight on file to calculate BMI. Constitutional Well developed. Well nourished.  Vascular Foot warm and well perfused. Capillary refill normal to all digits.   Neurologic Normal speech. Oriented to person,  place, and time. Epicritic sensation to light touch grossly present bilaterally.  Dermatologic Skin completely epithelialized.  No signs of dehiscence noted no complication noted.  Wound measurements below.  Granular wound bed noted  Orthopedic: Tenderness to palpation noted about the surgical site.   Radiographs: 3 views of skeletally mature the right foot: Status post arthroplasty of the first MPJ in good position. Assessment:   1. Status post foot surgery    Plan:  Patient was evaluated and treated and all questions answered.  S/p foot surgery right -Progressing as expected post-operatively. -XR: See above -WB Status: As tolerated in surgical shoe -Sutures: Removed no clinical signs of dehiscence noted no complication noted. -Medications: None -Official wound dehiscence noted measuring 3 cm x 0.3 cm x 1.5 cm.  Continue Santyl wet-to-dry dressing  No follow-ups on file.

## 2023-06-10 ENCOUNTER — Other Ambulatory Visit: Payer: Self-pay | Admitting: *Deleted

## 2023-06-10 DIAGNOSIS — G35 Multiple sclerosis: Secondary | ICD-10-CM

## 2023-06-10 MED ORDER — OCREVUS 300 MG/10ML IV SOLN
600.0000 mg | INTRAVENOUS | 1 refills | Status: AC
Start: 2023-06-10 — End: ?

## 2023-06-15 ENCOUNTER — Ambulatory Visit: Payer: 59 | Admitting: Neurology

## 2023-06-16 ENCOUNTER — Ambulatory Visit (INDEPENDENT_AMBULATORY_CARE_PROVIDER_SITE_OTHER): Payer: 59 | Admitting: Podiatry

## 2023-06-16 ENCOUNTER — Encounter: Payer: Self-pay | Admitting: Podiatry

## 2023-06-16 DIAGNOSIS — Z9889 Other specified postprocedural states: Secondary | ICD-10-CM

## 2023-06-16 NOTE — Progress Notes (Signed)
Subjective:  Patient ID: [name], female    DOB: 1960/12/09,  MRN: [id number]  Chief Complaint  Patient presents with   Routine Post Op    RM#14 POV right foot follow up doing well. Patient has a question about left foot area where injection was given previously has color change after injection.    DOS: 03/08/2023 Procedure: Right first MPJ arthroplasty with excision of benign skin lesion  63 y.o. female returns for post-op check.  Patient states that he is doing well.  Denies any other acute complaints.  She is completely healed.  She denies any other acute complaints.  Review of Systems: Negative except as noted in the HPI. Denies N/V/F/Ch.  Past Medical History:  Diagnosis Date   Acute cystitis    Acute metabolic encephalopathy    Anemia    Arthritis    Chronic pain    Diabetes mellitus    Fibromyalgia    GERD (gastroesophageal reflux disease)    Hepatitis C    received treatment   History of methicillin resistant staphylococcus aureus (MRSA)    Hypercholesteremia    Hypertension    IBS (irritable bowel syndrome)    Multiple sclerosis exacerbation (HCC)    Neurogenic bladder    pt denies having to cath herself-says she voids on her own as of 11-21-21   Neuropathy    Obesity    Sacral nerve stimulator present    battery is dead and has not worked in years   Stroke [facility])    pt denies this but pcp has it in their history   Tachycardia    UTI (urinary tract infection)    Vitamin D deficiency     Current Outpatient Medications:    amitriptyline (ELAVIL) 25 MG tablet, TAKE 1 TABLET(25 MG) BY MOUTH AT BEDTIME, Disp: 30 tablet, Rfl: 5   amLODipine (NORVASC) 10 MG tablet, Take 1 tablet (10 mg total) by mouth daily., Disp: , Rfl:    atorvastatin (LIPITOR) 80 MG tablet, Take 1 tablet (80 mg total) by mouth daily. (Patient taking differently: Take 80 mg by mouth every evening.), Disp: 90 tablet, Rfl: 3   Cholecalciferol (VITAMIN D-3) 125 MCG (5000 UT) TABS, Take 5,000  Units by mouth in the morning., Disp: , Rfl:    collagenase (SANTYL) 250 UNIT/GM ointment, Apply 1 Application topically daily., Disp: 15 g, Rfl: 0   collagenase (SANTYL) 250 UNIT/GM ointment, Apply 1 Application topically daily., Disp: 15 g, Rfl: 0   collagenase (SANTYL) 250 UNIT/GM ointment, Apply 1 Application topically daily., Disp: 15 g, Rfl: 0   collagenase (SANTYL) 250 UNIT/GM ointment, Apply 1 Application topically daily., Disp: 15 g, Rfl: 0   dicyclomine (BENTYL) 20 MG tablet, Take 40 mg by mouth 2 (two) times daily., Disp: , Rfl:    diphenoxylate-atropine (LOMOTIL) 2.5-0.025 MG tablet, Take 3 tablets by mouth 2 (two) times daily., Disp: , Rfl:    DULoxetine (CYMBALTA) 60 MG capsule, Take 60 mg by mouth 2 (two) times daily., Disp: , Rfl:    eszopiclone (LUNESTA) 1 MG TABS tablet, Take 1 mg by mouth at bedtime., Disp: , Rfl:    famotidine (PEPCID) 40 MG tablet, Take 40 mg by mouth 2 (two) times daily., Disp: , Rfl:    fluticasone (FLONASE) 50 MCG/ACT nasal spray, Place 2 sprays into the nose daily. (Patient taking differently: Place 2 sprays into both nostrils in the morning and at bedtime.), Disp: 16 g, Rfl: 11   gabapentin (NEURONTIN) 100 MG capsule, Take  100 mg by mouth 2 (two) times daily., Disp: , Rfl:    Homeopathic Products (LEG CRAMPS PO), Place 3 tablets under the tongue daily as needed. 3 am tablets and 3 pm tablets., Disp: , Rfl:    ibuprofen (ADVIL) 800 MG tablet, Take 800 mg by mouth 3 (three) times daily., Disp: , Rfl:    ibuprofen (ADVIL) 800 MG tablet, Take 1 tablet (800 mg total) by mouth every 6 (six) hours as needed., Disp: 60 tablet, Rfl: 1   lamoTRIgine (LAMICTAL) 200 MG tablet, TAKE 1 TABLET BY MOUTH TWICE DAILY, Disp: 60 tablet, Rfl: 5   lansoprazole (PREVACID) 30 MG capsule, Take 30 mg by mouth 2 (two) times daily before a meal., Disp: , Rfl:    loperamide (IMODIUM) 2 MG capsule, Take 1 capsule (2 mg total) by mouth 2 (two) times daily., Disp: 30 capsule, Rfl: 0    losartan (COZAAR) 50 MG tablet, Take 100 mg by mouth daily. Take 2 tablets daily for total of 100 mg, Disp: , Rfl:    MAGNESIUM-OXIDE 400 (240 Mg) MG tablet, Take 400 mg by mouth daily., Disp: , Rfl:    MELATONIN MAXIMUM STRENGTH 5 MG TABS, Take 5 mg by mouth at bedtime., Disp: , Rfl:    montelukast (SINGULAIR) 10 MG tablet, Take 10 mg by mouth at bedtime., Disp: , Rfl:    NARCAN 4 MG/0.1ML LIQD nasal spray kit, Place 1 spray into the nose once as needed (overdose)., Disp: , Rfl:    nystatin cream (MYCOSTATIN), Apply 1 Application topically 2 (two) times daily., Disp: , Rfl:    ocrelizumab (OCREVUS) 300 MG/10ML injection, Inject 20 mLs (600 mg total) into the vein every 6 (six) months., Disp: 20 mL, Rfl: 1   Oxcarbazepine (TRILEPTAL) 300 MG tablet, TAKE 1 TABLET(300 MG) BY MOUTH TWICE DAILY, Disp: 60 tablet, Rfl: 5   oxyCODONE (ROXICODONE) 15 MG immediate release tablet, Take 15 mg by mouth every 4 (four) hours as needed for pain. May take up to a total of 6 times per day if needed, Disp: , Rfl:    oxyCODONE-acetaminophen (PERCOCET) 5-325 MG tablet, Take 1 tablet by mouth every 4 (four) hours as needed for severe pain (pain score 7-10)., Disp: 30 tablet, Rfl: 0   pantoprazole (PROTONIX) 40 MG tablet, Take 40 mg by mouth 2 (two) times daily., Disp: , Rfl:    tirzepatide (MOUNJARO) 7.5 MG/0.5ML Pen, Inject 7.5 mg into the skin once a week., Disp: , Rfl:    tiZANidine (ZANAFLEX) 2 MG tablet, One po qAM, one po qPM, two po qHS, Disp: 120 tablet, Rfl: 11   VIBERZI 100 MG TABS, Take 100 mg by mouth 2 (two) times daily., Disp: , Rfl:    vitamin B-12 (CYANOCOBALAMIN) 500 MCG tablet, Take 500 mcg by mouth in the morning., Disp: , Rfl:   Social History   Tobacco Use  Smoking Status Former   Current packs/day: 0.00   Average packs/day: 0.3 packs/day for 12.0 years (3.0 ttl pk-yrs)   Types: Cigarettes   Start date: 03/2009   Quit date: 03/2021   Years since quitting: 2.2  Smokeless Tobacco Never   Tobacco Comments   06/13/20 1 pk per 3 days    No Known Allergies Objective:  There were no vitals filed for this visit. There is no height or weight on file to calculate BMI. Constitutional Well developed. Well nourished.  Vascular Foot warm and well perfused. Capillary refill normal to all digits.   Neurologic Normal speech.  Oriented to person, place, and time. Epicritic sensation to light touch grossly present bilaterally.  Dermatologic Skin completely epithelialized.  No signs of dehiscence noted no complication noted.  No further dehiscence noted.  Skin completely epithelialized.  Orthopedic: T No enderness to palpation noted about the surgical site.   Radiographs: 3 views of skeletally mature the right foot: Status post arthroplasty of the first MPJ in good position. Assessment:   No diagnosis found.  Plan:  Patient was evaluated and treated and all questions answered.  S/p foot surgery right -Clinically healed and officially discharged from my care if any foot and ankle issues on future she will come back and see me. No follow-ups on file.

## 2023-06-25 ENCOUNTER — Emergency Department (HOSPITAL_COMMUNITY)
Admission: EM | Admit: 2023-06-25 | Discharge: 2023-06-26 | Disposition: A | Discharge status: Home / Self Care | Payer: 59 | Attending: Emergency Medicine | Admitting: Emergency Medicine

## 2023-06-25 ENCOUNTER — Emergency Department (HOSPITAL_COMMUNITY): Payer: 59

## 2023-06-25 DIAGNOSIS — Z87891 Personal history of nicotine dependence: Secondary | ICD-10-CM | POA: Diagnosis not present

## 2023-06-25 DIAGNOSIS — I10 Essential (primary) hypertension: Secondary | ICD-10-CM | POA: Diagnosis not present

## 2023-06-25 DIAGNOSIS — Z794 Long term (current) use of insulin: Secondary | ICD-10-CM | POA: Insufficient documentation

## 2023-06-25 DIAGNOSIS — E119 Type 2 diabetes mellitus without complications: Secondary | ICD-10-CM | POA: Diagnosis not present

## 2023-06-25 DIAGNOSIS — G35 Multiple sclerosis: Secondary | ICD-10-CM | POA: Diagnosis present

## 2023-06-25 DIAGNOSIS — R519 Headache, unspecified: Secondary | ICD-10-CM | POA: Insufficient documentation

## 2023-06-25 DIAGNOSIS — Z7984 Long term (current) use of oral hypoglycemic drugs: Secondary | ICD-10-CM | POA: Insufficient documentation

## 2023-06-25 DIAGNOSIS — I679 Cerebrovascular disease, unspecified: Secondary | ICD-10-CM | POA: Diagnosis not present

## 2023-06-25 DIAGNOSIS — Z79899 Other long term (current) drug therapy: Secondary | ICD-10-CM | POA: Insufficient documentation

## 2023-06-25 DIAGNOSIS — Z20822 Contact with and (suspected) exposure to covid-19: Secondary | ICD-10-CM | POA: Insufficient documentation

## 2023-06-25 DIAGNOSIS — R5383 Other fatigue: Secondary | ICD-10-CM | POA: Insufficient documentation

## 2023-06-25 DIAGNOSIS — R531 Weakness: Secondary | ICD-10-CM

## 2023-06-25 LAB — BASIC METABOLIC PANEL
Anion gap: 10 (ref 5–15)
BUN: 11 mg/dL (ref 8–23)
CO2: 24 mmol/L (ref 22–32)
Calcium: 9.7 mg/dL (ref 8.9–10.3)
Chloride: 106 mmol/L (ref 98–111)
Creatinine, Ser: 0.73 mg/dL (ref 0.44–1.00)
GFR, Estimated: >60 mL/min (ref >60)
Glucose, Bld: 110 mg/dL — ABNORMAL HIGH (ref 70–99)
Potassium: 3.2 mmol/L — ABNORMAL LOW (ref 3.5–5.1)
Sodium: 140 mmol/L (ref 135–145)

## 2023-06-25 LAB — TROPONIN I (HIGH SENSITIVITY)
Troponin I (High Sensitivity): 14 ng/L (ref <18)
Troponin I (High Sensitivity): 15 ng/L (ref <18)

## 2023-06-25 LAB — CBC WITH DIFFERENTIAL/PLATELET
Abs Immature Granulocytes: 0.1 10*3/uL — ABNORMAL HIGH (ref 0.00–0.07)
Basophils Absolute: 0 10*3/uL (ref 0.0–0.1)
Basophils Relative: 0 %
Eosinophils Absolute: 0 10*3/uL (ref 0.0–0.5)
Eosinophils Relative: 0 %
HCT: 40.9 % (ref 36.0–46.0)
Hemoglobin: 14.4 g/dL (ref 12.0–15.0)
Immature Granulocytes: 1 %
Lymphocytes Relative: 13 %
Lymphs Abs: 2.1 10*3/uL (ref 0.7–4.0)
MCH: 29.8 pg (ref 26.0–34.0)
MCHC: 35.2 g/dL (ref 30.0–36.0)
MCV: 84.7 fL (ref 80.0–100.0)
Monocytes Absolute: 1.1 10*3/uL — ABNORMAL HIGH (ref 0.1–1.0)
Monocytes Relative: 7 %
Neutro Abs: 12.1 10*3/uL — ABNORMAL HIGH (ref 1.7–7.7)
Neutrophils Relative %: 79 %
Platelets: 354 10*3/uL (ref 150–400)
RBC: 4.83 MIL/uL (ref 3.87–5.11)
RDW: 12.9 % (ref 11.5–15.5)
WBC: 15.4 10*3/uL — ABNORMAL HIGH (ref 4.0–10.5)
nRBC: 0 % (ref 0.0–0.2)

## 2023-06-25 LAB — URINALYSIS, ROUTINE W REFLEX MICROSCOPIC
Bilirubin Urine: NEGATIVE
Glucose, UA: NEGATIVE mg/dL
Hgb urine dipstick: NEGATIVE
Ketones, ur: NEGATIVE mg/dL
Leukocytes,Ua: NEGATIVE
Nitrite: NEGATIVE
Protein, ur: NEGATIVE mg/dL
Specific Gravity, Urine: 1.014 (ref 1.005–1.030)
pH: 5 (ref 5.0–8.0)

## 2023-06-25 MED ORDER — OXYCODONE HCL 5 MG PO TABS
15.0000 mg | ORAL_TABLET | Freq: Once | ORAL | Status: AC
  Administered 2023-06-26: 15 mg via ORAL
  Filled 2023-06-25: qty 3

## 2023-06-25 MED ORDER — LORAZEPAM 2 MG/ML IJ SOLN
1.0000 mg | INTRAMUSCULAR | Status: AC | PRN
  Administered 2023-06-26: 1 mg via INTRAVENOUS
  Filled 2023-06-25: qty 1

## 2023-06-25 NOTE — ED Triage Notes (Signed)
 Pt complains of high blood pressure, and headache x 1 month. Nausea and neck pain x 1 week. Pt has MS and feels like she is having a flare up. Was at doctor yesterday and had to be given medication for BP before being dc from the appt.  Denies any chest pain and SOB.

## 2023-06-25 NOTE — ED Notes (Signed)
 Pt pain is coming back per Briahanna(NT)

## 2023-06-25 NOTE — ED Provider Notes (Addendum)
 [place] EMERGENCY DEPARTMENT AT [facility] Provider Note   CSN: [id number] Arrival date & time: 06/25/23  1316     History  Chief Complaint  Patient presents with   Hypertension   Headache    [name] is a 63 y.o. female.   Hypertension Associated symptoms include headaches.  Headache Associated symptoms: fatigue and weakness      63 year old female with medical history significant for diabetes mellitus, obesity, multiple sclerosis, chronic neck pain, presenting to the emergency department with a chief complaint of concern for MS flare.  The patient states that she has had worsening double vision over the past few days, fatigue, worsening weakness.  She has left hemibody weakness at baseline but states that it has worsened over the past few days.  She recently underwent OCREVUS  infusion on Thursday and was administered steroids prior to this.  She endorses double vision which is new over the past few days.  She endorses fatigue.  No fevers, no neck rigidity, does have chronic neck pain.  Home Medications Prior to Admission medications   Medication Sig Start Date End Date Taking? Authorizing Provider  amitriptyline  (ELAVIL ) 25 MG tablet TAKE 1 TABLET(25 MG) BY MOUTH AT BEDTIME 08/25/21   Sater, Charlie LABOR, MD  amLODipine  (NORVASC ) 10 MG tablet Take 1 tablet (10 mg total) by mouth daily. 07/16/21   [place], Camellia JINNY, DO  atorvastatin  (LIPITOR ) 80 MG tablet Take 1 tablet (80 mg total) by mouth daily. Patient taking differently: Take 80 mg by mouth every evening. 02/24/12   Copland, Harlene BROCKS, MD  Cholecalciferol  (VITAMIN D -3) 125 MCG (5000 UT) TABS Take 5,000 Units by mouth in the morning.    [provider]  collagenase  (SANTYL ) 250 UNIT/GM ointment Apply 1 Application topically daily. 03/13/22   Tobie Franky SQUIBB, DPM  collagenase  (SANTYL ) 250 UNIT/GM ointment Apply 1 Application topically daily. 04/02/23   Tobie Franky SQUIBB, DPM  collagenase  (SANTYL ) 250 UNIT/GM  ointment Apply 1 Application topically daily. 04/27/23   Tobie Franky SQUIBB, DPM  collagenase  (SANTYL ) 250 UNIT/GM ointment Apply 1 Application topically daily. 05/05/23   Tobie Franky SQUIBB, DPM  dicyclomine  (BENTYL ) 20 MG tablet Take 40 mg by mouth 2 (two) times daily. 12/21/17   [provider]  diphenoxylate -atropine  (LOMOTIL ) 2.5-0.025 MG tablet Take 3 tablets by mouth 2 (two) times daily. 02/15/22   [provider]  DULoxetine  (CYMBALTA ) 60 MG capsule Take 60 mg by mouth 2 (two) times daily. 01/03/22   [provider]  eszopiclone (LUNESTA) 1 MG TABS tablet Take 1 mg by mouth at bedtime.    [provider]  famotidine (PEPCID) 40 MG tablet Take 40 mg by mouth 2 (two) times daily.    [provider]  fluticasone  (FLONASE ) 50 MCG/ACT nasal spray Place 2 sprays into the nose daily. Patient taking differently: Place 2 sprays into both nostrils in the morning and at bedtime. 02/24/12   Copland, Harlene BROCKS, MD  gabapentin  (NEURONTIN ) 100 MG capsule Take 100 mg by mouth 2 (two) times daily. 02/16/22   [provider]  Homeopathic Products (LEG CRAMPS PO) Place 3 tablets under the tongue daily as needed. 3 am tablets and 3 pm tablets.    [provider]  ibuprofen  (ADVIL ) 800 MG tablet Take 800 mg by mouth 3 (three) times daily. 02/28/22   [provider]  ibuprofen  (ADVIL ) 800 MG tablet Take 1 tablet (800 mg total) by mouth every 6 (six) hours as needed. 03/08/23  Tobie Franky SQUIBB, DPM  lamoTRIgine  (LAMICTAL ) 200 MG tablet TAKE 1 TABLET BY MOUTH TWICE DAILY 12/31/22   Sater, Charlie LABOR, MD  lansoprazole (PREVACID) 30 MG capsule Take 30 mg by mouth 2 (two) times daily before a meal.    [provider]  loperamide  (IMODIUM ) 2 MG capsule Take 1 capsule (2 mg total) by mouth 2 (two) times daily. 07/31/21   Raenelle Coria, MD  losartan  (COZAAR ) 50 MG tablet Take 100 mg by mouth daily. Take 2 tablets daily for total of 100 mg 11/13/22   [provider]  MAGNESIUM -OXIDE 400 (240 Mg) MG tablet Take 400 mg by mouth daily. 03/29/21   [provider]  MELATONIN MAXIMUM STRENGTH 5 MG TABS Take 5 mg by mouth at bedtime. 07/31/19   [provider]  montelukast  (SINGULAIR ) 10 MG tablet Take 10 mg by mouth at bedtime. 07/31/19   [provider]  NARCAN  4 MG/0.1ML LIQD nasal spray kit Place 1 spray into the nose once as needed (overdose). 10/11/19   [provider]  nystatin cream (MYCOSTATIN) Apply 1 Application topically 2 (two) times daily. 07/01/22   [provider]  ocrelizumab  (OCREVUS ) 300 MG/10ML injection Inject 20 mLs (600 mg total) into the vein every 6 (six) months. 06/10/23   Sater, Charlie LABOR, MD  Oxcarbazepine  (TRILEPTAL ) 300 MG tablet TAKE 1 TABLET(300 MG) BY MOUTH TWICE DAILY 08/24/22   Sater, Charlie LABOR, MD  oxyCODONE  (ROXICODONE ) 15 MG immediate release tablet Take 15 mg by mouth every 4 (four) hours as needed for pain. May take up to a total of 6 times per day if needed 10/29/21   [provider]  oxyCODONE -acetaminophen  (PERCOCET) 5-325 MG tablet Take 1 tablet by mouth every 4 (four) hours as needed for severe pain (pain score 7-10). 03/08/23   Tobie Franky SQUIBB, DPM  pantoprazole  (PROTONIX ) 40 MG tablet Take 40 mg by mouth 2 (two) times daily.    [provider]  tirzepatide CLOYDE) 7.5 MG/0.5ML Pen Inject 7.5 mg into the skin once a week. 02/04/22   [provider]  tiZANidine  (ZANAFLEX ) 2 MG tablet One po qAM, one po qPM, two po qHS 12/07/22   Sater, Charlie LABOR, MD  VIBERZI  100 MG TABS Take 100 mg by mouth 2 (two) times daily. 11/07/19   [provider]  vitamin B-12 (CYANOCOBALAMIN ) 500 MCG tablet Take 500 mcg by mouth in the morning. 07/31/19   [provider]      Allergies    Patient has no known allergies.    Review of Systems   Review of Systems  Constitutional:  Positive for fatigue.  Neurological:  Positive for weakness and  headaches.  All other systems reviewed and are negative.   Physical Exam Updated Vital Signs BP (!) 157/78   Pulse 96   Temp 98.6 F (37 C) (Oral)   Resp 18   SpO2 97%  Physical Exam Vitals and nursing note reviewed.  Constitutional:      General: She is not in acute distress.    Appearance: She is well-developed.  HENT:     Head: Normocephalic and atraumatic.  Eyes:     Conjunctiva/sclera: Conjunctivae normal.  Cardiovascular:     Rate and Rhythm: Normal rate and regular rhythm.  Pulmonary:     Effort: Pulmonary effort is normal. No respiratory distress.     Breath sounds: Normal breath sounds.  Abdominal:     Palpations: Abdomen is soft.     Tenderness:  There is no abdominal tenderness.  Musculoskeletal:        General: No swelling.     Cervical back: Neck supple.  Skin:    General: Skin is warm and dry.     Capillary Refill: Capillary refill takes less than 2 seconds.  Neurological:     Mental Status: She is alert and oriented to person, place, and time.     GCS: GCS eye subscore is 4. GCS verbal subscore is 5. GCS motor subscore is 6.     Cranial Nerves: No cranial nerve deficit or facial asymmetry.     Sensory: Sensory deficit present.     Comments: CN intact other than Decreased sensation to light touch in the face and L hemibody. 4/5 strength L hemibody, 5/5 strength R hemibody.  Psychiatric:        Mood and Affect: Mood normal.     ED Results / Procedures / Treatments   Labs (all labs ordered are listed, but only abnormal results are displayed) Labs Reviewed  BASIC METABOLIC PANEL - Abnormal; Notable for the following components:      Result Value   Potassium 3.2 (*)    Glucose, Bld 110 (*)    All other components within normal limits  CBC WITH DIFFERENTIAL/PLATELET - Abnormal; Notable for the following components:   WBC 15.4 (*)    Neutro Abs 12.1 (*)    Monocytes Absolute 1.1 (*)    Abs Immature Granulocytes 0.10 (*)    All other components within  normal limits  RESP PANEL BY RT-PCR (RSV, FLU A&B, COVID)  [id number]  URINALYSIS, ROUTINE W REFLEX MICROSCOPIC  MAGNESIUM   TROPONIN I (HIGH SENSITIVITY)  TROPONIN I (HIGH SENSITIVITY)    EKG EKG Interpretation Date/Time:  Friday June 25 2023 19:32:15 EST Ventricular Rate:  99 PR Interval:  132 QRS Duration:  88 QT Interval:  342 QTC Calculation: 438 R Axis:   54  Text Interpretation: Normal sinus rhythm with sinus arrhythmia No significant change since last tracing Confirmed by Jerrol Agent ([id number]) on 06/25/2023 11:04:45 PM  Radiology DG Chest 2 View Result Date: 06/25/2023 CLINICAL DATA:  Hypertension. EXAM: CHEST - 2 VIEW COMPARISON:  July 06, 2021. FINDINGS: The heart size and mediastinal contours are within normal limits. Both lungs are clear. The visualized skeletal structures are unremarkable. IMPRESSION: No active cardiopulmonary disease. Electronically Signed   By: Agent Landy Raddle M.D.   On: 06/25/2023 16:15    Procedures Procedures    Medications Ordered in ED Medications  potassium chloride  SA (KLOR-CON  M) CR tablet 20 mEq (has no administration in time range)  oxyCODONE  (Oxy IR/ROXICODONE ) immediate release tablet 15 mg (15 mg Oral Given 06/26/23 0004)  LORazepam  (ATIVAN ) injection 1 mg (1 mg Intravenous Given 06/26/23 0122)    ED Course/ Medical Decision Making/ A&P                                 Medical Decision Making Amount and/or Complexity of Data Reviewed Labs: ordered. Radiology: ordered.  Risk Prescription drug management. Decision regarding hospitalization.      63 year old female with medical history significant for diabetes mellitus, obesity, multiple sclerosis, chronic neck pain, presenting to the emergency department with a chief complaint of concern for MS flare.  The patient states that she has had worsening double vision over the past few days, fatigue, worsening weakness.  She has left hemibody weakness at baseline but states that  it has  worsened over the past few days.  She recently underwent OCREVUS  infusion on Thursday and was administered steroids prior to this.  She endorses double vision which is new over the past few days.  She endorses fatigue.  No fevers, no neck rigidity, does have chronic neck pain.  On arrival, the patient was afebrile, initially tachycardic heart rate 109, improved to 96, BP 166/83, improved to 157/78 without intervention, saturating 99% on room air.  Patient presenting with worsening symptoms of fatigue and weakness compared to baseline.  Has known MS and receives infusions outpatient for this.  I discussed the patient's presentation with on-call neurology, Dr. Voncile who recommended MRI imaging of the brain and cervical spine.  Ativan  administered prior to MRI imaging as the patient endorsed claustrophobia.  Dr. Arora also recommended additional workup for electrolyte abnormality, infectious etiology, UA and chest x-ray.  CXR: No acute disease  Labs: Nonspecific leukocytosis to 15.4, no left shift, BMP with mild hypokalemia to 3.2, troponin 15, UA without evidence of UTI or hematuria.  Patient was administered Roxicodone  for pain control.  MR Brain and Cervical Spine WWO: unable to be obtained in the ER setting, pt refused.  Patient refused MRI after receiving IV Ativan  prior to MRI imaging.  States she needs exam under sedation or an open MRI. Medicine consulted for admission.  I spoke with Dr. Laurence of hospitalist medicine who will see the patient in consultation.  He will coordinate anesthesia to obtain MRI under sedation as the patient is refusing MRI without this.  Will try and keep the patient in a boarder status in the ER until MRI is obtained.  Signout given to Dr. Towana at 0700 to reassess the patient following her MRI results.   Final Clinical Impression(s) / ED Diagnoses Final diagnoses:  Hypertension, unspecified type  Multiple sclerosis [facility])    Rx / DC Orders ED Discharge Orders      None         Jerrol Agent, MD 06/26/23 [name]    Jerrol Agent, MD 06/26/23 [phone]

## 2023-06-25 NOTE — ED Provider Triage Note (Signed)
 Emergency Medicine Provider Triage Evaluation Note  CHARLENA HAUB , a 63 y.o. female  was evaluated in triage.  Pt complains of elevated blood pressures history of MS.  Review of Systems  Positive: Headache, blurred vision, congestion, nausea, neck pain Negative: Vomiting, diarrhea, abdominal pain, chest pain, shortness of breath, weakness  Physical Exam  BP (!) 166/83   Pulse (!) 109   Temp 98.4 F (36.9 C) (Oral)   Resp 18   SpO2 99%  Gen:   Awake, no distress, tearful/ Resp  Normal effort  MSK:   Moves extremities without difficulty  Other:  Full ROM of neck  Medical Decision Making  Medically screening exam initiated at 1:53 PM.  Appropriate orders placed.  NORA SABEY was informed that the remainder of the evaluation will be completed by another provider, this initial triage assessment does not replace that evaluation, and the importance of remaining in the ED until their evaluation is complete.  Labs and imaging ordered   Francis Ileana LOISE DEVONNA 06/25/23 1355

## 2023-06-26 ENCOUNTER — Emergency Department (EMERGENCY_DEPARTMENT_HOSPITAL): Payer: 59 | Admitting: Anesthesiology

## 2023-06-26 ENCOUNTER — Emergency Department (HOSPITAL_COMMUNITY): Payer: 59

## 2023-06-26 ENCOUNTER — Encounter (HOSPITAL_COMMUNITY): Payer: Self-pay | Admitting: Anesthesiology

## 2023-06-26 ENCOUNTER — Emergency Department (HOSPITAL_COMMUNITY): Payer: 59 | Admitting: Anesthesiology

## 2023-06-26 ENCOUNTER — Encounter (HOSPITAL_COMMUNITY)
Admission: EM | Disposition: A | Discharge status: Home / Self Care | Payer: Self-pay | Source: Home / Self Care | Attending: Emergency Medicine

## 2023-06-26 DIAGNOSIS — I10 Essential (primary) hypertension: Secondary | ICD-10-CM

## 2023-06-26 DIAGNOSIS — Z87891 Personal history of nicotine dependence: Secondary | ICD-10-CM

## 2023-06-26 DIAGNOSIS — I679 Cerebrovascular disease, unspecified: Secondary | ICD-10-CM | POA: Diagnosis not present

## 2023-06-26 DIAGNOSIS — G35 Multiple sclerosis: Secondary | ICD-10-CM

## 2023-06-26 HISTORY — PX: RADIOLOGY WITH ANESTHESIA: SHX6223

## 2023-06-26 LAB — RESP PANEL BY RT-PCR (RSV, FLU A&B, COVID)  RVPGX2
Influenza A by PCR: NEGATIVE
Influenza B by PCR: NEGATIVE
Resp Syncytial Virus by PCR: NEGATIVE
SARS Coronavirus 2 by RT PCR: NEGATIVE

## 2023-06-26 LAB — GLUCOSE, CAPILLARY
Glucose-Capillary: 105 mg/dL — ABNORMAL HIGH (ref 70–99)
Glucose-Capillary: 107 mg/dL — ABNORMAL HIGH (ref 70–99)

## 2023-06-26 LAB — MAGNESIUM: Magnesium: 2.1 mg/dL (ref 1.7–2.4)

## 2023-06-26 SURGERY — MRI WITH ANESTHESIA
Anesthesia: General

## 2023-06-26 MED ORDER — ROCURONIUM BROMIDE 10 MG/ML (PF) SYRINGE
PREFILLED_SYRINGE | INTRAVENOUS | Status: DC | PRN
  Administered 2023-06-26: 20 mg via INTRAVENOUS
  Administered 2023-06-26: 50 mg via INTRAVENOUS

## 2023-06-26 MED ORDER — SUCCINYLCHOLINE CHLORIDE 200 MG/10ML IV SOSY
PREFILLED_SYRINGE | INTRAVENOUS | Status: DC | PRN
  Administered 2023-06-26: 40 mg via INTRAVENOUS

## 2023-06-26 MED ORDER — LIDOCAINE 2% (20 MG/ML) 5 ML SYRINGE
INTRAMUSCULAR | Status: DC | PRN
  Administered 2023-06-26: 100 mg via INTRAVENOUS

## 2023-06-26 MED ORDER — PROPOFOL 10 MG/ML IV BOLUS
INTRAVENOUS | Status: DC | PRN
  Administered 2023-06-26: 160 mg via INTRAVENOUS

## 2023-06-26 MED ORDER — ORAL CARE MOUTH RINSE: 15.0000 mL | Freq: Once | OROMUCOSAL | Status: AC

## 2023-06-26 MED ORDER — FENTANYL CITRATE (PF) 100 MCG/2ML IJ SOLN
25.0000 ug | INTRAMUSCULAR | Status: DC | PRN
  Administered 2023-06-26 (×2): 50 ug via INTRAVENOUS

## 2023-06-26 MED ORDER — LACTATED RINGERS IV SOLN: INTRAVENOUS | Status: DC

## 2023-06-26 MED ORDER — GADOBUTROL 1 MMOL/ML IV SOLN
10.0000 mL | Freq: Once | INTRAVENOUS | Status: AC | PRN
  Administered 2023-06-26: 10 mL via INTRAVENOUS

## 2023-06-26 MED ORDER — CHLORHEXIDINE GLUCONATE 0.12 % MT SOLN
OROMUCOSAL | Status: AC
  Filled 2023-06-26: qty 15

## 2023-06-26 MED ORDER — DROPERIDOL 2.5 MG/ML IJ SOLN
INTRAMUSCULAR | Status: AC
  Filled 2023-06-26: qty 2

## 2023-06-26 MED ORDER — POTASSIUM CHLORIDE CRYS ER 20 MEQ PO TBCR
20.0000 meq | EXTENDED_RELEASE_TABLET | Freq: Once | ORAL | Status: AC
  Administered 2023-06-26: 20 meq via ORAL
  Filled 2023-06-26: qty 1

## 2023-06-26 MED ORDER — FENTANYL CITRATE (PF) 100 MCG/2ML IJ SOLN
INTRAMUSCULAR | Status: DC
Start: 2023-06-26 — End: 2023-06-26
  Filled 2023-06-26: qty 2

## 2023-06-26 MED ORDER — ONDANSETRON HCL 4 MG/2ML IJ SOLN
INTRAMUSCULAR | Status: DC | PRN
  Administered 2023-06-26: 4 mg via INTRAVENOUS

## 2023-06-26 MED ORDER — AMLODIPINE BESYLATE 5 MG PO TABS
10.0000 mg | ORAL_TABLET | Freq: Every day | ORAL | Status: DC
  Administered 2023-06-26: 10 mg via ORAL
  Filled 2023-06-26: qty 2

## 2023-06-26 MED ORDER — INSULIN ASPART 100 UNIT/ML IJ SOLN: 0.0000 [IU] | INTRAMUSCULAR | Status: DC | PRN

## 2023-06-26 MED ORDER — DROPERIDOL 2.5 MG/ML IJ SOLN
0.6250 mg | Freq: Once | INTRAMUSCULAR | Status: AC | PRN
  Administered 2023-06-26: 0.625 mg via INTRAVENOUS

## 2023-06-26 MED ORDER — OXYCODONE HCL 5 MG PO TABS
15.0000 mg | ORAL_TABLET | Freq: Once | ORAL | Status: AC
  Administered 2023-06-26: 15 mg via ORAL
  Filled 2023-06-26: qty 3

## 2023-06-26 MED ORDER — LOSARTAN POTASSIUM 50 MG PO TABS: 100.0000 mg | ORAL_TABLET | Freq: Every day | ORAL | Status: DC

## 2023-06-26 MED ORDER — CHLORHEXIDINE GLUCONATE 0.12 % MT SOLN
15.0000 mL | Freq: Once | OROMUCOSAL | Status: AC
  Administered 2023-06-26: 15 mL via OROMUCOSAL

## 2023-06-26 MED ORDER — PHENYLEPHRINE HCL-NACL 20-0.9 MG/250ML-% IV SOLN
INTRAVENOUS | Status: DC | PRN
  Administered 2023-06-26: 25 ug/min via INTRAVENOUS

## 2023-06-26 MED ORDER — SUGAMMADEX SODIUM 200 MG/2ML IV SOLN
INTRAVENOUS | Status: DC | PRN
  Administered 2023-06-26: 231.4 mg via INTRAVENOUS

## 2023-06-26 NOTE — Plan of Care (Signed)
 Overnight neurology note  Called by ED provider Dr. Gerlean regarding this patient with worsening left-sided weakness. She is a patient with MS and baseline left-sided weakness, on Ocrevus , patient of [facility].  Seen in the past for left-sided weakness without objective evidence of enhancement on scans with+/- benefit with steroids.  Recommended getting MRI brain and C-spine with and without contrast. Extremely claustrophobic and requires scanning under anesthesia. If the scans are positive for abnormal enhancement in the brain or C-spine, please give us  a call and we will be happy to jump in with a formal consultation but otherwise needs therapy assessments due to prior episodes being subjective weakness similar to this time.  -- Eligio Lav, MD Neurologist [facility]

## 2023-06-26 NOTE — Anesthesia Postprocedure Evaluation (Signed)
 Anesthesia Post Note  Patient: [name]  Procedure(s) Performed: MRI WITH ANESTHESIA     Patient location during evaluation: PACU Anesthesia Type: General Level of consciousness: sedated and patient cooperative Pain management: pain level controlled Vital Signs Assessment: post-procedure vital signs reviewed and stable Respiratory status: spontaneous breathing Cardiovascular status: stable Anesthetic complications: no   No notable events documented.  Last Vitals:  Vitals:   06/26/23 1400 06/26/23 1415  BP: 130/72 (!) 157/79  Pulse: 91 84  Resp: 15 14  Temp:    SpO2: 95% 95%    Last Pain:  Vitals:   06/26/23 1415  TempSrc:   PainSc: Asleep                 Norleen Pope

## 2023-06-26 NOTE — Anesthesia Procedure Notes (Signed)
 Procedure Name: Intubation Date/Time: 06/26/2023 11:55 AM  Performed by: Joshua Millman, CRNAPre-anesthesia Checklist: Patient identified, Emergency Drugs available, Suction available and Patient being monitored Patient Re-evaluated:Patient Re-evaluated prior to induction Oxygen Delivery Method: Circle system utilized Preoxygenation: Pre-oxygenation with 100% oxygen Induction Type: IV induction, Rapid sequence and Cricoid Pressure applied Laryngoscope Size: Glidescope and 3 Grade View: Grade I Tube type: Oral Tube size: 7.0 mm Number of attempts: 1 Airway Equipment and Method: Stylet Placement Confirmation: ETT inserted through vocal cords under direct vision, positive ETCO2 and breath sounds checked- equal and bilateral Secured at: 22 cm Tube secured with: Tape Dental Injury: Teeth and Oropharynx as per pre-operative assessment

## 2023-06-26 NOTE — Assessment & Plan Note (Signed)
 This is chronic, patient chronically on Ocrevus , some chronic left-sided weakness.  Patient presents with exacerbation of symptoms.  MRI at this time not actionable.  Patient noted to have elevated leukocytes.  May have mild systemic illness causing exacerbation.  On my exam I did not appreciate any focal weakness.  Continue to look for systemic illness that may be causing this.  Viral panel is negative chest x-ray is negative.  Patient does not report any dysuria.  Patient has mild headache, consider acetaminophen  650 mg every 4 hours as needed for mild pain.

## 2023-06-26 NOTE — ED Notes (Signed)
 Per Dr Adrain Alar okay to let pt eat at this time

## 2023-06-26 NOTE — Transfer of Care (Signed)
 Immediate Anesthesia Transfer of Care Note  Patient: [name]  Procedure(s) Performed: MRI WITH ANESTHESIA  Patient Location: PACU  Anesthesia Type:General  Level of Consciousness: awake, alert , and oriented  Airway & Oxygen Therapy: Patient Spontanous Breathing and Patient connected to nasal cannula oxygen  Post-op Assessment: Report given to RN and Post -op Vital signs reviewed and stable  Post vital signs: Reviewed and stable  Last Vitals:  Vitals Value Taken Time  BP 155/80   Temp    Pulse 97   RR 13   SpO2 97     Last Pain:  Vitals:   06/26/23 0915  TempSrc:   PainSc: Asleep         Complications: No notable events documented.

## 2023-06-26 NOTE — ED Provider Notes (Signed)
 Signout from Dr. Jerrol.  For 63 year old female with MS here with Depoe plegia fatigue worsening weakness.  Concern for MS flare.  Neurology involved and patient is pending MRI brain and cervical spine.  Apparently has a lot of anxiety with imaging and needs anesthesia involvement for sedation to obtain imaging.  Hospitalist was contacted regarding admission and declining at this time until imaging done and need for admission is further clarified. Physical Exam  BP (!) 159/86 (BP Location: Right Arm)   Pulse 96   Temp 98.6 F (37 C) (Oral)   Resp 18   SpO2 97%   Physical Exam  Procedures  Procedures  ED Course / MDM    Medical Decision Making Amount and/or Complexity of Data Reviewed Labs: ordered. Radiology: ordered.  Risk Prescription drug management. Decision regarding hospitalization.   Patient's MRI of brain and cervical spine did not show any evidence of active demyelination.  Reviewed this with patient and she has Dr. Vear from call for neurology to follow-up with outpatient.  She is comfortable plan for discharge.       Towana Ozell BROCKS, MD 06/26/23 [phone]

## 2023-06-26 NOTE — ED Notes (Signed)
 Pt transported to MRI at this time

## 2023-06-26 NOTE — Assessment & Plan Note (Signed)
 Agree with continuing losartan  plus amlodipine .  Patient blood pressure at this time is not immediately alarming.  Has varied between 130/72 and 152/86.  Further titration of blood pressure medications at home./Outpatient.  At this time continue with patient's home regimen.

## 2023-06-26 NOTE — Discharge Instructions (Signed)
 You were seen in the emergency department for increased weakness.  Your MRI of brain and cervical spine did not show any evidence of acute MS flare.  Please continue your regular medications and follow-up with your outpatient neurologist and primary care team.  Return to the emergency department if any worsening or concerning symptoms

## 2023-06-26 NOTE — Anesthesia Preprocedure Evaluation (Addendum)
 Anesthesia Evaluation  Patient identified by MRN, date of birth, ID band Patient awake    Reviewed: Allergy & Precautions, NPO status , Patient's Chart, lab work & pertinent test results  Airway Mallampati: III  TM Distance: >3 FB Neck ROM: Full    Dental  (+) Dental Advisory Given, Teeth Intact,    Pulmonary shortness of breath, former smoker   Pulmonary exam normal breath sounds clear to auscultation       Cardiovascular hypertension, Pt. on medications Normal cardiovascular exam Rhythm:Regular Rate:Normal     Neuro/Psych  PSYCHIATRIC DISORDERS Anxiety Depression    MS CVA    GI/Hepatic ,GERD  Medicated,,(+) Hepatitis -, C  Endo/Other  diabetes, Type 2, Insulin  Dependent  Class 3 obesity  Renal/GU negative Renal ROS     Musculoskeletal  (+) Arthritis ,  Fibromyalgia -  Abdominal  (+) + obese  Peds  Hematology  (+) Blood dyscrasia, anemia   Anesthesia Other Findings   Reproductive/Obstetrics                             Anesthesia Physical Anesthesia Plan  ASA: 3  Anesthesia Plan: General   Post-op Pain Management: Minimal or no pain anticipated   Induction: Intravenous, Rapid sequence and Cricoid pressure planned  PONV Risk Score and Plan: 3 and Ondansetron , Treatment may vary due to age or medical condition and Dexamethasone   Airway Management Planned: Oral ETT  Additional Equipment: None  Intra-op Plan:   Post-operative Plan: Possible Post-op intubation/ventilation  Informed Consent: I have reviewed the patients History and Physical, chart, labs and discussed the procedure including the risks, benefits and alternatives for the proposed anesthesia with the patient or authorized representative who has indicated his/her understanding and acceptance.     Dental advisory given  Plan Discussed with: CRNA  Anesthesia Plan Comments: (PAT note written 08/17/2022 by Allison  Zelenak, PA-C.  )       Anesthesia Quick Evaluation

## 2023-06-26 NOTE — ED Notes (Signed)
 Dr Adrain Alar made aware that pt is refusing MRI and they are sending her back to ED at this time.

## 2023-06-26 NOTE — Consult Note (Signed)
 Initial Consultation Note   Patient: [name] FMW:[id number] DOB: 01/13/1961 PCP: Elizbeth Leita Ruth, FNP DOA: 06/25/2023 DOS: the patient was seen and examined on 06/26/2023 Primary service: Towana Ozell BROCKS, MD  Referring physician: Dr. Towana Reason for consult: weakness  Assessment/Plan: Assessment and Plan: Essential hypertension Agree with continuing losartan  plus amlodipine .  Patient blood pressure at this time is not immediately alarming.  Has varied between 130/72 and 152/86.  Further titration of blood pressure medications at home./Outpatient.  At this time continue with patient's home regimen.  Multiple sclerosis (HCC) This is chronic, patient chronically on Ocrevus , some chronic left-sided weakness.  Patient presents with exacerbation of symptoms.  MRI at this time not actionable.  Patient noted to have elevated leukocytes.  May have mild systemic illness causing exacerbation.  On my exam I did not appreciate any focal weakness.  Continue to look for systemic illness that may be causing this.  Viral panel is negative chest x-ray is negative.  Patient does not report any dysuria.  Patient has mild headache, consider acetaminophen  650 mg every 4 hours as needed for mild pain.  Other concerns as per primary team, or please call to discuss.     TRH will sign off at present, please call us  again when needed.  HPI: [name] is a 63 y.o. female with past medical history of MS, chronic left sided weakness. Cane dependent ambulation patinet sees neurology for this reason.  Patient reports subjective left sided weakness (chronic) that is worsened X 24-48 hours. Patient is s.p. MRI Brain and C spnie in ER. Shows unchanged chronic demyelination in the brain and cervical cord.  No evidence of new lesions.  Also it shows similar to prior moderate to severe foraminal stenosis bilaterally at C5-C6 and on the right at C6-C7.    Patient also reports mild headache.  And is concerned  about her blood pressure readings in the ER.  Patient has actually not taken any of her medications since yesterday.    Review of Systems: As mentioned in the history of present illness. All other systems reviewed and are negative. Past Medical History:  Diagnosis Date   Acute cystitis    Acute metabolic encephalopathy    Anemia    Arthritis    Chronic pain    Diabetes mellitus    Fibromyalgia    GERD (gastroesophageal reflux disease)    Hepatitis C    received treatment   History of methicillin resistant staphylococcus aureus (MRSA)    Hypercholesteremia    Hypertension    IBS (irritable bowel syndrome)    Multiple sclerosis exacerbation (HCC)    Neurogenic bladder    pt denies having to cath herself-says she voids on her own as of 11-21-21   Neuropathy    Obesity    Sacral nerve stimulator present    battery is dead and has not worked in years   Stroke [facility])    pt denies this but pcp has it in their history   Tachycardia    UTI (urinary tract infection)    Vitamin D  deficiency    Past Surgical History:  Procedure Laterality Date   ABDOMINAL HYSTERECTOMY     partial   I & D EXTREMITY Right 12/14/2020   Procedure: IRRIGATION AND DEBRIDEMENT RIGHT LOWER EXTREMITY;  Surgeon: Fidel Rogue, MD;  Location: [facility] OR;  Service: Orthopedics;  Laterality: Right;   JOINT REPLACEMENT     LTK   ORIF ANKLE FRACTURE Left 11/25/2021  Procedure: OPEN REDUCTION INTERNAL FIXATION (ORIF) LEFT ANKLE FRACTURE;  Surgeon: Tobie Franky SQUIBB, DPM;  Location: [facility] ORS;  Service: Orthopedics;  Laterality: Left;  POPLITEAL BLOCK   RADIOLOGY WITH ANESTHESIA N/A 02/18/2021   Procedure: MRI WITH ANESTHESIA CERVICAL SPINE WITH AND WITHOUT CONTRAST AND BRAIN WITH AND WITHOUT CONTRAST;  Surgeon: Radiologist, Medication, MD;  Location: [facility] OR;  Service: Radiology;  Laterality: N/A;   RADIOLOGY WITH ANESTHESIA N/A 07/07/2021   Procedure: MRI WITH ANESTHESIA;  Surgeon: Radiologist, Medication, MD;  Location: [facility]  OR;  Service: Radiology;  Laterality: N/A;   RADIOLOGY WITH ANESTHESIA N/A 08/18/2022   Procedure: MRI BRAIN WITH AND WITHOUT CONTRAST; MRI CERVICAL SPINE WITHE AND WITOUT CONTRAST WITH ANESTHESIA;  Surgeon: Radiologist, Medication, MD;  Location: [facility] OR;  Service: Radiology;  Laterality: N/A;   SACRAL NERVE STIMULATOR PLACEMENT     [employer]   SYNDESMOSIS REPAIR Left 11/25/2021   Procedure: SYNDESMOSIS REPAIR;  Surgeon: Tobie Franky SQUIBB, DPM;  Location: [facility] ORS;  Service: Orthopedics;  Laterality: Left;   TUBAL LIGATION     Social History:  reports that she quit smoking about 2 years ago. Her smoking use included cigarettes. She started smoking about 14 years ago. She has a 3 pack-year smoking history. She has never used smokeless tobacco. She reports that she does not drink alcohol and does not use drugs.  No Known Allergies  Family History  Problem Relation Age of Onset   Diabetes Mother    Hypertension Mother    Stroke Father     Prior to Admission medications   Medication Sig Start Date End Date Taking? Authorizing Provider  amLODipine -olmesartan (AZOR) 10-40 MG tablet Take 1 tablet by mouth daily.   Yes [provider]  atorvastatin  (LIPITOR ) 80 MG tablet Take 1 tablet (80 mg total) by mouth daily. 02/24/12  Yes Copland, Harlene BROCKS, MD  collagenase  (SANTYL ) 250 UNIT/GM ointment Apply 1 Application topically daily. Patient taking differently: Apply 1 Application topically daily as needed (wound healing). 05/05/23  Yes Tobie Franky SQUIBB, DPM  dicyclomine  (BENTYL ) 20 MG tablet Take 40 mg by mouth 2 (two) times daily. 12/21/17  Yes [provider]  diphenhydramine-acetaminophen  (TYLENOL  PM) 25-500 MG TABS tablet Take 1 tablet by mouth at bedtime as needed (sleep, pain).   Yes [provider]  diphenoxylate -atropine  (LOMOTIL ) 2.5-0.025 MG tablet Take 3 tablets by mouth 2 (two) times daily. 02/15/22  Yes [provider]  DULoxetine  (CYMBALTA ) 60 MG capsule Take 60 mg  by mouth 2 (two) times daily. 01/03/22  Yes [provider]  eszopiclone (LUNESTA) 1 MG TABS tablet Take 1 mg by mouth at bedtime as needed for sleep.   Yes [provider]  fluticasone  (FLONASE ) 50 MCG/ACT nasal spray Place 2 sprays into the nose daily. Patient taking differently: Place 2 sprays into both nostrils in the morning and at bedtime. 02/24/12  Yes Copland, Harlene BROCKS, MD  gabapentin  (NEURONTIN ) 100 MG capsule Take 100 mg by mouth 2 (two) times daily. 02/16/22  Yes [provider]  ibuprofen  (ADVIL ) 800 MG tablet Take 1 tablet (800 mg total) by mouth every 6 (six) hours as needed. Patient taking differently: Take 800 mg by mouth 3 (three) times daily as needed for moderate pain (pain score 4-6). 03/08/23  Yes Tobie Franky SQUIBB, DPM  Ibuprofen -Acetaminophen  (ADVIL  DUAL ACTION) 125-250 MG TABS Take 2 tablets by mouth 2 (two) times daily as needed (pain, headache).   Yes [provider]  loperamide  (IMODIUM  A-D) 2 MG tablet  Take 2 mg by mouth 2 (two) times daily.   Yes [provider]  loperamide  (IMODIUM ) 2 MG capsule Take 1 capsule (2 mg total) by mouth 2 (two) times daily. 07/31/21  Yes Raenelle Coria, MD  losartan  (COZAAR ) 100 MG tablet Take 100 mg by mouth at bedtime.   Yes [provider]  Melatonin 10 MG TABS Take 10-20 mg by mouth at bedtime as needed (sleep).   Yes [provider]  montelukast  (SINGULAIR ) 10 MG tablet Take 10 mg by mouth at bedtime. 07/31/19  Yes [provider]  Multiple Vitamins-Minerals (HAIR SKIN & NAILS) TABS Take 1 tablet by mouth 2 (two) times daily.   Yes [provider]  NARCAN  4 MG/0.1ML LIQD nasal spray kit Place 1 spray into the nose once as needed (overdose). 10/11/19  Yes [provider]  ocrelizumab  (OCREVUS ) 300 MG/10ML injection Inject 20 mLs (600 mg total) into the vein every 6 (six) months. 06/10/23  Yes Sater, Charlie LABOR, MD  oxyCODONE  (ROXICODONE ) 15 MG immediate  release tablet Take 15 mg by mouth every 4 (four) hours as needed for pain. May take up to a total of 6 times per day if needed 10/29/21  Yes [provider]  pantoprazole  (PROTONIX ) 40 MG tablet Take 40 mg by mouth 2 (two) times daily.   Yes [provider]  tirzepatide CLOYDE) 7.5 MG/0.5ML Pen Inject 7.5 mg into the skin every Wednesday. 02/04/22  Yes [provider]  tiZANidine  (ZANAFLEX ) 2 MG tablet One po qAM, one po qPM, two po qHS Patient taking differently: Take 4 mg by mouth 2 (two) times daily. 12/07/22  Yes Sater, Charlie LABOR, MD  VIBERZI  100 MG TABS Take 100 mg by mouth 2 (two) times daily. 11/07/19  Yes [provider]  vitamin B-12 (CYANOCOBALAMIN ) 500 MCG tablet Take 1,000 mcg by mouth daily. 07/31/19  Yes [provider]  lamoTRIgine  (LAMICTAL ) 200 MG tablet TAKE 1 TABLET BY MOUTH TWICE DAILY Patient not taking: Reported on 06/26/2023 12/31/22   Vear Charlie LABOR, MD    Physical Exam: Vitals:   06/26/23 1345 06/26/23 1400 06/26/23 1415 06/26/23 1430  BP: 136/83 130/72 (!) 157/79 (!) 152/86  Pulse: 93 91 84 92  Resp: 16 15 14 14   Temp:    97.6 F (36.4 C)  TempSrc:      SpO2: 94% 95% 95% 96%  Weight:      Height:       General: Patient is alert and awake coherent.  Appears to be in no distress obese appearing. Respiratory exam: Bilateral intravesicular Cardiovascular exam S1-S2 normal Abdomen all quadrant soft nontender Extremities warm without edema Neurologically I do not appreciate any focal motor deficit. Data Reviewed:   Labs on Admission:  Results for orders placed or performed during the hospital encounter of 06/25/23 (from the past 24 hours)  Troponin I (High Sensitivity)     Status: None   Collection Time: 06/25/23  8:30 PM  Result Value Ref Range   Troponin I (High Sensitivity) 15 <18 ng/L  Magnesium      Status: None   Collection Time: 06/25/23  8:30 PM  Result Value Ref Range   Magnesium  2.1 1.7 - 2.4 mg/dL  Resp  panel by RT-PCR (RSV, Flu A&B, Covid) Anterior Nasal Swab     Status: None   Collection Time: 06/26/23 12:07 AM   Specimen: Anterior Nasal Swab  Result Value Ref Range   SARS Coronavirus 2 by RT PCR NEGATIVE NEGATIVE  Influenza A by PCR NEGATIVE NEGATIVE   Influenza B by PCR NEGATIVE NEGATIVE   Resp Syncytial Virus by PCR NEGATIVE NEGATIVE  Glucose, capillary     Status: Abnormal   Collection Time: 06/26/23 10:54 AM  Result Value Ref Range   Glucose-Capillary 105 (H) 70 - 99 mg/dL  Glucose, capillary     Status: Abnormal   Collection Time: 06/26/23  1:31 PM  Result Value Ref Range   Glucose-Capillary 107 (H) 70 - 99 mg/dL   Basic Metabolic Panel: Recent Labs  Lab 06/25/23 1411 06/25/23 2030  NA 140  --   K 3.2*  --   CL 106  --   CO2 24  --   GLUCOSE 110*  --   BUN 11  --   CREATININE 0.73  --   CALCIUM  9.7  --   MG  --  2.1   Liver Function Tests: No results for input(s): AST, ALT, ALKPHOS, BILITOT, PROT, ALBUMIN in the last 168 hours. No results for input(s): LIPASE, AMYLASE in the last 168 hours. No results for input(s): AMMONIA in the last 168 hours. CBC: Recent Labs  Lab 06/25/23 1411  WBC 15.4*  NEUTROABS 12.1*  HGB 14.4  HCT 40.9  MCV 84.7  PLT 354   Cardiac Enzymes: Recent Labs  Lab 06/25/23 1411 06/25/23 2030  TROPONINIHS 14 15    BNP (last 3 results) No results for input(s): PROBNP in the last 8760 hours. CBG: Recent Labs  Lab 06/26/23 1054 06/26/23 1331  GLUCAP 105* 107*    Radiological Exams on Admission:  MR Brain W and Wo Contrast Result Date: 06/26/2023 CLINICAL DATA:  Weakness; Multiple sclerosis (MS) MS, concern for flare EXAM: MRI HEAD WITHOUT AND WITH CONTRAST MRI CERVICAL SPINE WITHOUT AND WITH CONTRAST CONTRAST:  10mL GADAVIST  GADOBUTROL  1 MMOL/ML IV SOLN TECHNIQUE: Multiplanar, multiecho pulse sequences of the brain and surrounding structures, and cervical spine were obtained without and with intravenous  contrast. COMPARISON:  MRI head and MRI cervical spine August 18, 2022. FINDINGS: MRI HEAD FINDINGS Brain: No acute infarction, hemorrhage, hydrocephalus, extra-axial collection or mass lesion. Stable T2/FLAIR hyperintensities in the white matter. No pathologic enhancement. Vascular: Major arterial flow voids are maintained at the skull base. Skull and upper cervical spine: Normal marrow signal. Sinuses/Orbits: Clear sinuses.  No acute orbital findings. Other: No mastoid effusions. MRI CERVICAL SPINE FINDINGS Alignment: No substantial sagittal subluxation. Vertebrae: No fracture, evidence of discitis, or bone lesion. Cord: Similar patchy upper cervical chain cord signal abnormality, compatible with chronic demyelination. No definite new cord signal abnormality. No abnormal cord enhancement. Posterior Fossa, vertebral arteries, paraspinal tissues: Opacities in the visualized lung apices. Vertebral artery flow voids are maintained. Disc levels: Similar multilevel degenerative change, greatest at C5-C6 where there is moderate to severe bilateral foraminal stenosis. Moderate to severe right foraminal stenosis C6-C7. IMPRESSION: 1. Unchanged chronic demyelination in the brain and cervical cord. No evidence of new lesions or active demyelination. 2. Opacities 3. In the visualized lung apices, concerning for pneumonia and/or aspiration. A CT of the chest could better characterize if clinically warranted. 4. Similar multilevel degenerative change in the cervical spine including moderate to severe foraminal stenosis bilaterally at C5-C6 and on the right at C6-C7. Electronically Signed   By: Gilmore GORMAN Molt M.D.   On: 06/26/2023 13:51   MR Cervical Spine W and Wo Contrast Result Date: 06/26/2023 CLINICAL DATA:  Weakness; Multiple sclerosis (MS) MS, concern for flare EXAM: MRI HEAD WITHOUT AND WITH CONTRAST MRI CERVICAL  SPINE WITHOUT AND WITH CONTRAST CONTRAST:  10mL GADAVIST  GADOBUTROL  1 MMOL/ML IV SOLN TECHNIQUE:  Multiplanar, multiecho pulse sequences of the brain and surrounding structures, and cervical spine were obtained without and with intravenous contrast. COMPARISON:  MRI head and MRI cervical spine August 18, 2022. FINDINGS: MRI HEAD FINDINGS Brain: No acute infarction, hemorrhage, hydrocephalus, extra-axial collection or mass lesion. Stable T2/FLAIR hyperintensities in the white matter. No pathologic enhancement. Vascular: Major arterial flow voids are maintained at the skull base. Skull and upper cervical spine: Normal marrow signal. Sinuses/Orbits: Clear sinuses.  No acute orbital findings. Other: No mastoid effusions. MRI CERVICAL SPINE FINDINGS Alignment: No substantial sagittal subluxation. Vertebrae: No fracture, evidence of discitis, or bone lesion. Cord: Similar patchy upper cervical chain cord signal abnormality, compatible with chronic demyelination. No definite new cord signal abnormality. No abnormal cord enhancement. Posterior Fossa, vertebral arteries, paraspinal tissues: Opacities in the visualized lung apices. Vertebral artery flow voids are maintained. Disc levels: Similar multilevel degenerative change, greatest at C5-C6 where there is moderate to severe bilateral foraminal stenosis. Moderate to severe right foraminal stenosis C6-C7. IMPRESSION: 1. Unchanged chronic demyelination in the brain and cervical cord. No evidence of new lesions or active demyelination. 2. Opacities 3. In the visualized lung apices, concerning for pneumonia and/or aspiration. A CT of the chest could better characterize if clinically warranted. 4. Similar multilevel degenerative change in the cervical spine including moderate to severe foraminal stenosis bilaterally at C5-C6 and on the right at C6-C7. Electronically Signed   By: Gilmore GORMAN Molt M.D.   On: 06/26/2023 13:51   DG Chest 2 View Result Date: 06/25/2023 CLINICAL DATA:  Hypertension. EXAM: CHEST - 2 VIEW COMPARISON:  July 06, 2021. FINDINGS: The heart size  and mediastinal contours are within normal limits. Both lungs are clear. The visualized skeletal structures are unremarkable. IMPRESSION: No active cardiopulmonary disease. Electronically Signed   By: Lynwood Landy Raddle M.D.   On: 06/25/2023 16:15    No intake/output data recorded. Total I/O In: 500 [I.V.:500] Out: 0         Family Communication: per pateint. Primary team communication: d.w. primary team. Anticiapte discharge. Thank you very much for involving us  in the care of your patient.  Author: Jacqulyn Divine, MD 06/26/2023 3:25 PM  For on call review [email or web address].

## 2023-06-28 ENCOUNTER — Encounter (HOSPITAL_COMMUNITY): Payer: Self-pay | Admitting: Radiology

## 2023-08-04 ENCOUNTER — Encounter: Payer: Self-pay | Admitting: Neurology

## 2023-08-04 ENCOUNTER — Ambulatory Visit (INDEPENDENT_AMBULATORY_CARE_PROVIDER_SITE_OTHER): Payer: 59 | Admitting: Neurology

## 2023-08-04 VITALS — BP 149/85 | HR 97 | Ht 63.0 in | Wt 255.5 lb

## 2023-08-04 DIAGNOSIS — M5441 Lumbago with sciatica, right side: Secondary | ICD-10-CM | POA: Diagnosis not present

## 2023-08-04 DIAGNOSIS — M5442 Lumbago with sciatica, left side: Secondary | ICD-10-CM

## 2023-08-04 DIAGNOSIS — G35 Multiple sclerosis: Secondary | ICD-10-CM | POA: Diagnosis not present

## 2023-08-04 DIAGNOSIS — R269 Unspecified abnormalities of gait and mobility: Secondary | ICD-10-CM

## 2023-08-04 DIAGNOSIS — E1142 Type 2 diabetes mellitus with diabetic polyneuropathy: Secondary | ICD-10-CM

## 2023-08-04 DIAGNOSIS — R208 Other disturbances of skin sensation: Secondary | ICD-10-CM

## 2023-08-04 DIAGNOSIS — G8929 Other chronic pain: Secondary | ICD-10-CM

## 2023-08-04 DIAGNOSIS — Z79899 Other long term (current) drug therapy: Secondary | ICD-10-CM

## 2023-08-04 MED ORDER — TIZANIDINE HCL 4 MG PO TABS: ORAL_TABLET | ORAL | 11 refills | Status: DC

## 2023-08-04 MED ORDER — KETOROLAC TROMETHAMINE 60 MG/2ML IM SOLN
60.0000 mg | Freq: Once | INTRAMUSCULAR | Status: AC
  Administered 2023-08-04: 60 mg via INTRAMUSCULAR

## 2023-08-04 MED ORDER — GABAPENTIN 300 MG PO CAPS: 300.0000 mg | ORAL_CAPSULE | Freq: Two times a day (BID) | ORAL | 11 refills | Status: DC

## 2023-08-04 NOTE — Progress Notes (Signed)
 [facility]  PATIENT: [name] DOB: 29-Jun-1960  REFERRING DOCTOR OR PCP: Newton Pigg, FNP SOURCE: Patient, notes from primary care, imaging and lab reports, MRI images personally reviewed.  _________________________________   HISTORICAL  CHIEF COMPLAINT:  Chief Complaint  Patient presents with   Multiple Sclerosis    Rm10, alone, Multiple sclerosis:doing ok. Other chronic pain:still a lot lower back radiates to toes on bilateral feet. Neck pain:denied having neck pain. Gait disturbance:cane to help with mobility, Diabetic peripheral neuropathy:burning, tingly, nerves jump in left leg, needs bp recheck prior to leaving     HISTORY OF PRESENT ILLNESS:  [name] is a 63 y.o. woman with multiple sclerosis, LBP and gait disturbance  Update 08/04/2023:.  She is on Ocrevus  and tolerates it well.Marland Kitchen  Her first infusion was August 2023 and last infusion was 12/18/2022.  She has tolerated the infusions well.   Having more back pain - known DJD and sees pain management.    She gets intermittent stabbing pain in the left leg that is very painful.   She is on low dose gabapentin.  She stopped lamotrigine as it had not helped her.      She feels gait is a little worse due to the pain and she tires out more easily,  He left leg buckles.   She uses the cane mostly and now not using the walker.    With her cane she can go 100 -200 feet.  She is having some difficulty doing activities of daily living.   We tried dalfampridine but it did not help.  Amantadine has not helped     She is on tizanidine (felt spasticity better on combination of baclofen and tizanidine but felt sleepy).   She reports more difficulty with her vision.  She sometimes has diplopia.  Even with glasses, she feels vision is poor.   She may have early glaucoma an sees ophthalmology.    She continues to note burning dysesthetic pain in the arms and legs. Her feet have a burning.    Lamotrigine and  oxcarbazepine help the dysesthesias some.  Gabapentin had not helped pain and she gained weight on it .  Her vision is doing about the same.  She has urinary urgency and frequency.  esides MS she has NIDDM (was on insulin, now on Mounjaro and lost 10-15 pounds).    She has jaw pain on the right, worse with mouth open or chewing.   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    She is on Cymbalta 60 mg po qd and lamotrigine 200 mg po bid.     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  She is on eszopiclone   She sees [facility] and other controlled substances.     She has neck pain that is worse.   Pain increases with movements.  Left=right.   She also has LBP and sees Dr. Heath Gold for pain management and is on  oxycodone 15 mg q6  She had MBBB/RFA helped the right side but not the left side.  Surgery has been discussed with her.   She is also on tizanidine, baclofen and amitriptyline to help her pain.   She sees Dr. Retia Passe.   MS HISTORY She was diagnosed with MS in 2008 after presenting with leg weakness and headache, neck pain, back pain.  MRIs  showed foci in her cervical spine and brain.   She was started on Rebif.   Initially she did well but she reports having a lot of relapses.   She was switched to Copaxone but also had more symptoms.   She was then switched to Tysabri for several years but reports having several relapses.   She switched to Tecfidera 5 years ago.   She reports having a flare in 2015 and had 5 days of IV Solu-medrol.   July 2021, she was complaining of blurry vision, stiff neck and increased burning and tingling a few weeks ago.  She went to the ED and had MRIs performed showing no new lesions.    She switched to [facility] August 2023  IMAGING: MRI of the brain and cervical spine 06/26/2023 showed no new MS lesions in brain or spine..    DJD, with worse levels C5-C6 (severe  biforaminal stenosis) and C6-C7 (right foraminal stenosis)  MRI of the cervical spine 11/18/2019 and 03/02/2012 showed two T2 hyperintense foci adjacent to C2 and C3-C4.  There was mild multilevel degenerative changes as well.    MRI of the brain 11/18/2019 and 03/02/2020 show a fairly low plaque burden of T2/FLAIR hyperintense foci.  Most of them are nonspecific.  A few are periventricular.  None acute and no change over time.  MRI of the cervical spine 02/18/2021 showed no new lesions an stable DJD with C5C6 and C6C7 mild spinal senosis  MRI of the brain 02/18/2021 showed no change compared to 11/18/2019  MR brain and spinal cord 08/18/2022 showed no new lesions  REVIEW OF SYSTEMS: Constitutional: No fevers, chills, sweats, or change in appetite.  She has fatigue and she reports insomnia Eyes: No visual changes, double vision, eye pain Ear, nose and throat: No hearing loss, ear pain, nasal congestion, sore throat Cardiovascular: No chest pain, palpitations Respiratory:  No shortness of breath at rest or with exertion.   No wheezes GastrointestinaI: No nausea, vomiting, diarrhea, abdominal pain, fecal incontinence Genitourinary:  No dysuria, urinary retention or frequency.  No nocturia. Musculoskeletal: Pain all over in muscles, joints and spine Integumentary: No rash, pruritus, skin lesions Neurological: as above Psychiatric: She has depression and anxiety Endocrine: No palpitations, diaphoresis, change in appetite, change in weigh or increased thirst Hematologic/Lymphatic:  No anemia, purpura, petechiae. Allergic/Immunologic: No itchy/runny eyes, nasal congestion, recent allergic reactions, rashes  ALLERGIES: No Known Allergies  HOME MEDICATIONS:  Current Outpatient Medications:    amLODipine-olmesartan (AZOR) 10-40 MG tablet, Take 1 tablet by mouth daily., Disp: , Rfl:    atorvastatin (LIPITOR) 80 MG tablet, Take 1 tablet (80 mg total) by mouth daily., Disp: 90 tablet, Rfl: 3    Cholecalciferol (VITAMIN D3) 250 MCG (10000 UT) capsule, Take 10,000 Units by mouth daily., Disp: , Rfl:    collagenase (SANTYL) 250 UNIT/GM ointment, Apply 1 Application topically daily. (Patient taking differently: Apply 1 Application topically daily as needed (wound healing).), Disp: 15 g, Rfl: 0   dicyclomine (BENTYL) 20 MG tablet, Take 40 mg by mouth 2 (two) times daily., Disp: , Rfl:    diphenhydramine-acetaminophen (TYLENOL PM) 25-500 MG TABS tablet, Take 1 tablet by mouth at bedtime as needed (sleep, pain)., Disp: , Rfl:    diphenoxylate-atropine (LOMOTIL) 2.5-0.025 MG tablet, Take 3 tablets by mouth 2 (two) times daily., Disp: , Rfl:    DULoxetine (CYMBALTA) 60 MG capsule, Take 60 mg by mouth 2 (two) times daily., Disp: , Rfl:    eszopiclone (LUNESTA) 1 MG TABS  tablet, Take 1 mg by mouth at bedtime as needed for sleep., Disp: , Rfl:    fluticasone (FLONASE) 50 MCG/ACT nasal spray, Place 2 sprays into the nose daily. (Patient taking differently: Place 2 sprays into both nostrils in the morning and at bedtime.), Disp: 16 g, Rfl: 11   gabapentin (NEURONTIN) 100 MG capsule, Take 100 mg by mouth 2 (two) times daily., Disp: , Rfl:    ibuprofen (ADVIL) 800 MG tablet, Take 1 tablet (800 mg total) by mouth every 6 (six) hours as needed. (Patient taking differently: Take 800 mg by mouth 3 (three) times daily as needed for moderate pain (pain score 4-6).), Disp: 60 tablet, Rfl: 1   Ibuprofen-Acetaminophen (ADVIL DUAL ACTION) 125-250 MG TABS, Take 2 tablets by mouth 2 (two) times daily as needed (pain, headache)., Disp: , Rfl:    loperamide (IMODIUM A-D) 2 MG tablet, Take 2 mg by mouth 2 (two) times daily., Disp: , Rfl:    loperamide (IMODIUM) 2 MG capsule, Take 1 capsule (2 mg total) by mouth 2 (two) times daily., Disp: 30 capsule, Rfl: 0   Melatonin 10 MG TABS, Take 10-20 mg by mouth at bedtime as needed (sleep)., Disp: , Rfl:    montelukast (SINGULAIR) 10 MG tablet, Take 10 mg by mouth at bedtime.,  Disp: , Rfl:    Multiple Vitamins-Minerals (HAIR SKIN & NAILS) TABS, Take 1 tablet by mouth 2 (two) times daily., Disp: , Rfl:    NARCAN 4 MG/0.1ML LIQD nasal spray kit, Place 1 spray into the nose once as needed (overdose)., Disp: , Rfl:    ocrelizumab (OCREVUS) 300 MG/10ML injection, Inject 20 mLs (600 mg total) into the vein every 6 (six) months., Disp: 20 mL, Rfl: 1   oxyCODONE (ROXICODONE) 15 MG immediate release tablet, Take 15 mg by mouth every 4 (four) hours as needed for pain. May take up to a total of 6 times per day if needed, Disp: , Rfl:    pantoprazole (PROTONIX) 40 MG tablet, Take 40 mg by mouth 2 (two) times daily., Disp: , Rfl:    tirzepatide (MOUNJARO) 7.5 MG/0.5ML Pen, Inject 7.5 mg into the skin every Wednesday., Disp: , Rfl:    tiZANidine (ZANAFLEX) 2 MG tablet, One po qAM, one po qPM, two po qHS (Patient taking differently: Take 4 mg by mouth 2 (two) times daily.), Disp: 120 tablet, Rfl: 11   VIBERZI 100 MG TABS, Take 100 mg by mouth 2 (two) times daily., Disp: , Rfl:    vitamin B-12 (CYANOCOBALAMIN) 500 MCG tablet, Take 1,000 mcg by mouth daily., Disp: , Rfl:   PAST MEDICAL HISTORY: Past Medical History:  Diagnosis Date   Acute cystitis    Acute metabolic encephalopathy    Anemia    Arthritis    Chronic pain    Diabetes mellitus    Fibromyalgia    GERD (gastroesophageal reflux disease)    Hepatitis C    received treatment   History of methicillin resistant staphylococcus aureus (MRSA)    Hypercholesteremia    Hypertension    IBS (irritable bowel syndrome)    Multiple sclerosis exacerbation (HCC)    Neurogenic bladder    pt denies having to cath herself-says she voids on her own as of 11-21-21   Neuropathy    Obesity    Sacral nerve stimulator present    battery is dead and has not worked in years   Stroke [facility])    pt denies this but pcp has it in their history  Tachycardia    UTI (urinary tract infection)    Vitamin D deficiency     PAST SURGICAL  HISTORY: Past Surgical History:  Procedure Laterality Date   ABDOMINAL HYSTERECTOMY     partial   I & D EXTREMITY Right 12/14/2020   Procedure: IRRIGATION AND DEBRIDEMENT RIGHT LOWER EXTREMITY;  Surgeon: Samson Frederic, MD;  Location: [facility] OR;  Service: Orthopedics;  Laterality: Right;   JOINT REPLACEMENT     LTK   ORIF ANKLE FRACTURE Left 11/25/2021   Procedure: OPEN REDUCTION INTERNAL FIXATION (ORIF) LEFT ANKLE FRACTURE;  Surgeon: Candelaria Stagers, DPM;  Location: [facility] ORS;  Service: Orthopedics;  Laterality: Left;  POPLITEAL BLOCK   RADIOLOGY WITH ANESTHESIA N/A 02/18/2021   Procedure: MRI WITH ANESTHESIA CERVICAL SPINE WITH AND WITHOUT CONTRAST AND BRAIN WITH AND WITHOUT CONTRAST;  Surgeon: Radiologist, Medication, MD;  Location: [facility] OR;  Service: Radiology;  Laterality: N/A;   RADIOLOGY WITH ANESTHESIA N/A 07/07/2021   Procedure: MRI WITH ANESTHESIA;  Surgeon: Radiologist, Medication, MD;  Location: [facility] OR;  Service: Radiology;  Laterality: N/A;   RADIOLOGY WITH ANESTHESIA N/A 08/18/2022   Procedure: MRI BRAIN WITH AND WITHOUT CONTRAST; MRI CERVICAL SPINE WITHE AND WITOUT CONTRAST WITH ANESTHESIA;  Surgeon: Radiologist, Medication, MD;  Location: [facility] OR;  Service: Radiology;  Laterality: N/A;   RADIOLOGY WITH ANESTHESIA N/A 06/26/2023   Procedure: MRI WITH ANESTHESIA;  Surgeon: Radiologist, Medication, MD;  Location: [facility] OR;  Service: Radiology;  Laterality: N/A;   SACRAL NERVE STIMULATOR PLACEMENT     [employer]   SYNDESMOSIS REPAIR Left 11/25/2021   Procedure: SYNDESMOSIS REPAIR;  Surgeon: Candelaria Stagers, DPM;  Location: [facility] ORS;  Service: Orthopedics;  Laterality: Left;   TUBAL LIGATION      FAMILY HISTORY: Family History  Problem Relation Age of Onset   Diabetes Mother    Hypertension Mother    Stroke Father     SOCIAL HISTORY:  Social History   Socioeconomic History   Marital status: Single    Spouse name: Not on file   Number of children: 2   Years of education: Not on file    Highest education level: Not on file  Occupational History   Not on file  Tobacco Use   Smoking status: Former    Current packs/day: 0.00    Average packs/day: 0.3 packs/day for 12.0 years (3.0 ttl pk-yrs)    Types: Cigarettes    Start date: 03/2009    Quit date: 03/2021    Years since quitting: 2.3   Smokeless tobacco: Never   Tobacco comments:    06/13/20 1 pk per 3 days  Vaping Use   Vaping status: Never Used  Substance and Sexual Activity   Alcohol use: No   Drug use: No   Sexual activity: Not on file  Other Topics Concern   Not on file  Social History Narrative   06/13/20 lives alone, dgtr across the street   Social Drivers of Health   Financial Resource Strain: Not on file  Food Insecurity: Not on file  Transportation Needs: Not on file  Physical Activity: Not on file  Stress: Not on file  Social Connections: Not on file  Intimate Partner Violence: Not on file     PHYSICAL EXAM  Vitals:   08/04/23 0821  BP: (!) 149/85  Pulse: 97  Weight: 255 lb 8 oz (115.9 kg)  Height: 5\' 3"  (1.6 m)     Body mass index is 45.26 kg/m.   General: The patient  is well-developed and well-nourished and in no acute distress  HEENT:  Head is [place]/AT.  Sclera are anicteric.     Skin: Extremities are without rash.  There is mild ankle edema Musculoskeletal: She has tenderness in the cervical and lumbar paraspinal muscles.   Some piriformis tendernss.  Neurologic Exam  Mental status: The patient is alert and oriented x 3 at the time of the examination. The patient has apparent normal recent and remote memory, with an apparently normal attention span and concentration ability.   Speech is normal.  Cranial nerves: Extraocular movements are full.  Facial strength and sensation was normal.  No obvious hearing deficits are noted.  Motor:  Minimal hand intention tremor.  Muscle bulk is normal.   Tone is slightly increased in legs.  . Strength is  5 / 5 in all 4 extremities except  4+/5 ankles,4/5 toes, slightly worse on left. .   Sensory: She reported reduced sensation to touch and vibration in the left arm and leg.  Decreased sensation to vibration in the toes and ankles compared to the knees  Coordination: Cerebellar testing reveals good finger-nose-finger and heel-to-shin bilaterally.  Gait and station: Station is normal.  Can alk without cane.  She has a reduced stride that is mildly wide.  The gait is also arthritic.Marland Kitchen  She is unable to do her tandem walk.  Romberg is negative.   Reflexes: Deep tendon reflexes are 1 and symmetric in the limbs.       DIAGNOSTIC DATA (LABS, IMAGING, TESTING) - I reviewed patient records, labs, notes, testing and imaging myself where available.  Lab Results  Component Value Date   WBC 15.4 (H) 06/25/2023   HGB 14.4 06/25/2023   HCT 40.9 06/25/2023   MCV 84.7 06/25/2023   PLT 354 06/25/2023      Component Value Date/Time   NA 140 06/25/2023 1411   NA 143 03/25/2022 1030   K 3.2 (L) 06/25/2023 1411   CL 106 06/25/2023 1411   CO2 24 06/25/2023 1411   GLUCOSE 110 (H) 06/25/2023 1411   BUN 11 06/25/2023 1411   BUN 11 03/25/2022 1030   CREATININE 0.73 06/25/2023 1411   CREATININE 0.85 02/24/2012 1044   CALCIUM 9.7 06/25/2023 1411   PROT 7.5 03/25/2022 1030   ALBUMIN 4.8 03/25/2022 1030   AST 17 03/25/2022 1030   ALT 16 03/25/2022 1030   ALKPHOS 136 (H) 03/25/2022 1030   BILITOT 0.3 03/25/2022 1030   GFRNONAA >60 06/25/2023 1411   GFRAA >60 11/17/2019 1242   Lab Results  Component Value Date   CHOL (H) 02/19/2009    237        ATP III CLASSIFICATION:  <200     mg/dL   Desirable  782-956  mg/dL   Borderline High  >=213    mg/dL   High          HDL 52 02/19/2009   LDLCALC (H) 02/19/2009    135        Total Cholesterol/HDL:CHD Risk Coronary Heart Disease Risk Table                     Men   Women  1/2 Average Risk   3.4   3.3  Average Risk       5.0   4.4  2 X Average Risk   9.6   7.1  3 X Average Risk   23.4   11.0        Use  the calculated Patient Ratio above and the CHD Risk Table to determine the patient's CHD Risk.        ATP III CLASSIFICATION (LDL):  <100     mg/dL   Optimal  621-308  mg/dL   Near or Above                    Optimal  130-159  mg/dL   Borderline  657-846  mg/dL   High  >962     mg/dL   Very High   TRIG 952 (H) 02/19/2009   CHOLHDL 4.6 02/19/2009   Lab Results  Component Value Date   HGBA1C 5.5 07/06/2021   Lab Results  Component Value Date   VITAMINB12 1,987 (H) 03/25/2022   Lab Results  Component Value Date   TSH 1.080 10/23/2021       ASSESSMENT AND PLAN  Multiple sclerosis (HCC)  Gait disturbance  High risk medication use  Other chronic pain  Diabetic peripheral neuropathy (HCC)  Dysesthesia    1.    Continue Ocrevus.  IgG and IgM were fine recently.   2.    Increase tizanidine to 4 mg po tid (12 mg/day).   Increase gabapentin to 300 mg po tid 3.    Encouraged weight loss.   Advised to eat healthy.  Continue vitamin D. 4.    She prefers cane over walker 5.    Toradol 60 mg IM today for pain 6.     Return in 4-6 months or sooner if there are new or worsening neurologic symptoms.   This visit is part of a comprehensive longitudinal care medical relationship regarding the patients primary diagnosis of MS and related concerns.  Corie Allis A. Epimenio Foot, MD, [facility] 08/04/2023, 9:05 AM Certified in Neurology, Clinical Neurophysiology, Sleep Medicine and Neuroimaging  [facility] [address] [place], [place] [address] [phone]

## 2023-08-05 ENCOUNTER — Encounter: Payer: Self-pay | Admitting: Neurology

## 2023-08-05 LAB — CBC WITH DIFFERENTIAL/PLATELET
Basophils Absolute: 0 10*3/uL (ref 0.0–0.2)
Basos: 1 %
EOS (ABSOLUTE): 0.1 10*3/uL (ref 0.0–0.4)
Eos: 2 %
Hematocrit: 42.9 % (ref 34.0–46.6)
Hemoglobin: 14.2 g/dL (ref 11.1–15.9)
Immature Grans (Abs): 0 10*3/uL (ref 0.0–0.1)
Immature Granulocytes: 1 %
Lymphocytes Absolute: 2.6 10*3/uL (ref 0.7–3.1)
Lymphs: 31 %
MCH: 29.4 pg (ref 26.6–33.0)
MCHC: 33.1 g/dL (ref 31.5–35.7)
MCV: 89 fL (ref 79–97)
Monocytes Absolute: 0.7 10*3/uL (ref 0.1–0.9)
Monocytes: 8 %
Neutrophils Absolute: 4.9 10*3/uL (ref 1.4–7.0)
Neutrophils: 57 %
Platelets: 271 10*3/uL (ref 150–450)
RBC: 4.83 x10E6/uL (ref 3.77–5.28)
RDW: 13.4 % (ref 11.7–15.4)
WBC: 8.3 10*3/uL (ref 3.4–10.8)

## 2023-08-05 LAB — IGG, IGA, IGM
IgA/Immunoglobulin A, Serum: 275 mg/dL (ref 87–352)
IgG (Immunoglobin G), Serum: 743 mg/dL (ref 586–1602)
IgM (Immunoglobulin M), Srm: 72 mg/dL (ref 26–217)

## 2023-08-23 ENCOUNTER — Telehealth: Payer: Self-pay | Admitting: Neurology

## 2023-08-23 ENCOUNTER — Telehealth: Payer: Self-pay

## 2023-08-23 ENCOUNTER — Other Ambulatory Visit (HOSPITAL_COMMUNITY): Payer: Self-pay

## 2023-08-23 MED ORDER — TIZANIDINE HCL 4 MG PO TABS: ORAL_TABLET | ORAL | 1 refills | Status: AC

## 2023-08-23 MED ORDER — GABAPENTIN 300 MG PO CAPS: 300.0000 mg | ORAL_CAPSULE | Freq: Two times a day (BID) | ORAL | 1 refills | Status: DC

## 2023-08-23 NOTE — Telephone Encounter (Signed)
 Called pt she is trying to get 90 day supply for gabapentin states she needs PA due to requesting 90 day supply.  It appears the last Rx's was sent in for 30 day supply (not 90 day supply)  #180 tablets sent (90 day supply)  Pt aware when next refill is due.

## 2023-08-23 NOTE — Telephone Encounter (Signed)
 PA is needed for the below medications.

## 2023-08-23 NOTE — Telephone Encounter (Signed)
       No PA needed-this med is on her plan of covered drugs, Refill is too soon-test claim shows last filled a QTY of 90 on 08/04/2023, next fill due on 09/07/2023.

## 2023-08-23 NOTE — Telephone Encounter (Signed)
 Pt called stating that she is needing a PA for her tiZANidine (ZANAFLEX) 4 MG tablet and her gabapentin (NEURONTIN) 300 MG capsule sent to her Insurance. Please advise.

## 2023-08-23 NOTE — Telephone Encounter (Signed)
       No PA needed-this med is on her list of covered drugs, refill is too soon per test claim. Last filled 08/21/2023 #90. Next fill available on 09/07/2023.

## 2023-08-23 NOTE — Telephone Encounter (Signed)
 Patient called requesting 90 day supply sent to pharmacy. Pt states pharmacy told her that a PA is needed if requesting 90 day supply.

## 2023-09-01 ENCOUNTER — Other Ambulatory Visit (HOSPITAL_COMMUNITY): Payer: Self-pay

## 2023-11-02 ENCOUNTER — Telehealth: Payer: Self-pay | Admitting: Neurology

## 2023-11-02 NOTE — Telephone Encounter (Signed)
 Pt called in regards to motorscooters not charging . Pt would like to speak to MD . Pt states that motor scooter is not Charging anymore . Pt think it could be battery but is not sure . Pt also states that she leaves for vacation on Monday

## 2023-11-02 NOTE — Telephone Encounter (Signed)
 Called pt back at [phone]. Feels she needs new battery. Appears she got electric scooter from [employer] back in 2022 when I reviewed chart notes. Advised her to call them at [phone] to further inquire about this. She will do this. Aware they also handle any PA's via insurance if needed.

## 2023-11-30 ENCOUNTER — Telehealth: Payer: Self-pay | Admitting: Podiatry

## 2023-11-30 NOTE — Telephone Encounter (Signed)
 Pt left message asking for a call back she has some questions about her previous surgeries.  I returned call and she is going to orthopedics for her knee tomorrow and they were asking when she had her previous surgeries.  I looked in chart and gave her surgery dates.

## 2024-01-05 ENCOUNTER — Telehealth: Payer: Self-pay | Admitting: Neurology

## 2024-01-05 NOTE — Telephone Encounter (Signed)
 Pt last seen 08/04/23 by Dr. Vear. Next f/u scheduled for 03/07/24. MS DMT: Ocrevus . Infusion site: [facility]. Last received: 12/23/2023.  I called pt at [phone]. Left for vacation 01/03/24. Week before, increased ting/burning started in hands and has worsened over time.Now has increased pain/tingling in feet/legs more painful. Sx intermittent throughout day.   Tolerated last Ocrevus  infusion well. Denies any current illness/infection. No other new meds started recently. Currently in [place] .  Will be home 01/07/24.  Using scooter more d/t worsening sx but scooter old/will not keep a charge. Wants order for new scooter.   She requested to schedule appt either Mon/Tues next week. I scheduled for 01/10/24 at 930a with Dr. Vear. Aware I will speak with MD to make sure he does not suggest anything prior to appt and call back. She will also see if there is a [facility] nearby in case he wants to send in a prescription.

## 2024-01-05 NOTE — Telephone Encounter (Signed)
 Pt believes she is having a MS flare up, numbness in hands at 1st then numbness in legs and feet along with burning, tingling getting more intense.  Pt states she just recently had an infusion.

## 2024-01-05 NOTE — Telephone Encounter (Addendum)
 Spoke w/ Dr. Vear. He wants her to continue to monitor sx. D/t personal hx DM, does not want to do steroids. If sx worsen/become severe, she should proceed to ED for immediate evaluation/treatment. She should keep f/u as scheduled.  I called pt and relayed above. She verbalized understanding.

## 2024-01-10 ENCOUNTER — Ambulatory Visit (INDEPENDENT_AMBULATORY_CARE_PROVIDER_SITE_OTHER): Admitting: Neurology

## 2024-01-10 ENCOUNTER — Encounter: Payer: Self-pay | Admitting: Neurology

## 2024-01-10 VITALS — BP 87/60 | HR 90 | Ht 63.0 in | Wt 263.5 lb

## 2024-01-10 DIAGNOSIS — M542 Cervicalgia: Secondary | ICD-10-CM

## 2024-01-10 DIAGNOSIS — G8929 Other chronic pain: Secondary | ICD-10-CM | POA: Diagnosis not present

## 2024-01-10 DIAGNOSIS — R269 Unspecified abnormalities of gait and mobility: Secondary | ICD-10-CM | POA: Diagnosis not present

## 2024-01-10 DIAGNOSIS — G35 Multiple sclerosis: Secondary | ICD-10-CM | POA: Diagnosis not present

## 2024-01-10 DIAGNOSIS — Z7985 Long-term (current) use of injectable non-insulin antidiabetic drugs: Secondary | ICD-10-CM

## 2024-01-10 DIAGNOSIS — R208 Other disturbances of skin sensation: Secondary | ICD-10-CM

## 2024-01-10 DIAGNOSIS — Z79899 Other long term (current) drug therapy: Secondary | ICD-10-CM

## 2024-01-10 DIAGNOSIS — M21372 Foot drop, left foot: Secondary | ICD-10-CM | POA: Insufficient documentation

## 2024-01-10 DIAGNOSIS — R5383 Other fatigue: Secondary | ICD-10-CM | POA: Insufficient documentation

## 2024-01-10 DIAGNOSIS — E1142 Type 2 diabetes mellitus with diabetic polyneuropathy: Secondary | ICD-10-CM

## 2024-01-10 NOTE — Progress Notes (Signed)
 [facility]  PATIENT: [name] DOB: December 28, 1960  REFERRING DOCTOR OR PCP: Leita Norse, FNP SOURCE: Patient, notes from primary care, imaging and lab reports, MRI images personally reviewed.  _________________________________   HISTORICAL  CHIEF COMPLAINT:  Chief Complaint  Patient presents with   RM11/MS    Pt is here with her nephew. Pt states that her hands are getting numb and swell, as well a a tingling feeling. Pt states that she is having spasms,burning, numbness. Pt states that she was holding a cup and it dropped right out of her hand. Pt states that her neck is stiff. Pt states that she has more stiffness on her left side as well as pain when she turns her neck. Pt denies any falls,but has had a couple of stumbles. Pt needs a new scooter.      HISTORY OF PRESENT ILLNESS:  [name] is a 63 y.o. woman with multiple sclerosis, LBP and gait disturbance  Update 01/10/2024: Mobility:  She is currently using a scooter due to poor mobility from her MS.  It is old and not working well.   Therefore she needs a new one.    Current mobility:   Due to MS she has left > right leg weakness affecting gait.    She uses a scooter in her home due to the leg weakness affecting gait.  She has stumbles but no recent falls.  She has left > right leg spasticity    She can not walk without a cane.  With a cane, she  can walk > 20 feet if not tired but only across the room if tired.  She is having difficulty doing activities of daily living due to the reduced gait..   We tried dalfampridine  but it did not help.  Amantadine  has not helped     She is on tizanidine  (felt spasticity better on combination of baclofen  and tizanidine  but felt sleepy).   Fatigue from MS is often severe.    Musculoskeletal pain also hinders her gait (neck/back/legs)     Mobility Needs:  She cannot use a cane or walker due to inability to consistently and safely walk.  Due to fatigue, she cannot  wheel a self propelled wheelchair.  Therefore she needs a powered vehicle.  A scooter can meet her mobility needs and she can safely operate in her home.     MS  She is on Ocrevus   and tolerates it well..  Her last infusion was December 18 2023 and last infusion was 12/18/2022.  She has tolerated the infusions well.    She has NIDDM and is on Mounjaro (has also lost a couple pounds).      She reports more difficulty with her vision.  She sometimes has diplopia.  Even with glasses, she feels vision is poor.   She may have early glaucoma an sees ophthalmology.    She continues to note burning dysesthetic pain in the arms and legs. Her feet have a burning.    Lamotrigine  and oxcarbazepine  help the dysesthesias some.  Gabapentin  had not helped pain and she gained weight on it .  Her vision is doing about the same.  She has urinary urgency and frequency.  Other:    She is reporting more pain -- neck pain/stiffness (L>R), hand pain, leg spasms and pain.  Also having more back pain - known DJD and sees pain management.      To help pain 400 mg po gabapentin  tid and  oxycodone  15 mg x 5/day.   For spasms, she takes tizanidone 4 mg po 1-1-2 (16 mg/day).    .She also has  intermittent stabbing pain in the left leg that is very painful.   She is on  gabapentin .  She stopped lamotrigine  as it had not helped her.      Besides MS she has NIDDM (was on insulin , now on Mounjaro and lost 10-15 pounds).     Mood is doing well and she denies depression or anxiety.    She is on Cymbalta  60 mg po qd and lamotrigine  200 mg po bid.     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  She is on eszopiclone   She sees [facility] for Pain Management and other controlled substances.     She has neck pain that is worse.   Pain increases with movements.  Left=right.   S  For her musculoskeletal pain, she sees Dr. Geofm for pain management Heather and is on  oxycodone  15 mg q6  She had MBBB/RFA helped the right  side but not the left side.  Surgery has been discussed with her.   She is also on tizanidine , baclofen  and amitriptyline  to help her pain.   She sees Dr. Saullo for procedures..   MS HISTORY She was diagnosed with MS in 2008 after presenting with leg weakness and headache, neck pain, back pain.  MRIs showed foci in her cervical spine and brain.   She was started on Rebif.   Initially she did well but she reports having a lot of relapses.   She was switched to Copaxone but also had more symptoms.   She was then switched to Tysabri for several years but reports having several relapses.   She switched to Tecfidera  5 years ago.   She reports having a flare in 2015 and had 5 days of IV Solu-medrol .   July 2021, she was complaining of blurry vision, stiff neck and increased burning and tingling a few weeks ago.  She went to the ED and had MRIs performed showing no new lesions.    She switched to Ocrevus  August 2023  IMAGING: MRI of the brain and cervical spine 06/26/2023 showed no new MS lesions in brain or spine..    DJD, with worse levels C5-C6 (severe biforaminal stenosis) and C6-C7 (right foraminal stenosis)  MRI of the cervical spine 11/18/2019 and 03/02/2012 showed two T2 hyperintense foci adjacent to C2 and C3-C4.  There was mild multilevel degenerative changes as well.    MRI of the brain 11/18/2019 and 03/02/2020 show a fairly low plaque burden of T2/FLAIR hyperintense foci.  Most of them are nonspecific.  A few are periventricular.  None acute and no change over time.  MRI of the cervical spine 02/18/2021 showed no new lesions an stable DJD with C5C6 and C6C7 mild spinal senosis  MRI of the brain 02/18/2021 showed no change compared to 11/18/2019  MR brain and spinal cord 08/18/2022 showed no new lesions  REVIEW OF SYSTEMS: Constitutional: No fevers, chills, sweats, or change in appetite.  She has fatigue and she reports insomnia Eyes: No visual changes, double vision, eye pain Ear, nose and throat:  No hearing loss, ear pain, nasal congestion, sore throat Cardiovascular: No chest pain, palpitations Respiratory:  No shortness of breath at rest or with exertion.   No wheezes GastrointestinaI: No nausea, vomiting, diarrhea, abdominal pain, fecal incontinence Genitourinary:  No dysuria, urinary retention or frequency.  No  nocturia. Musculoskeletal: Pain all over in muscles, joints and spine Integumentary: No rash, pruritus, skin lesions Neurological: as above Psychiatric: She has depression and anxiety Endocrine: No palpitations, diaphoresis, change in appetite, change in weigh or increased thirst Hematologic/Lymphatic:  No anemia, purpura, petechiae. Allergic/Immunologic: No itchy/runny eyes, nasal congestion, recent allergic reactions, rashes  ALLERGIES: No Known Allergies  HOME MEDICATIONS:  Current Outpatient Medications:    amLODipine -olmesartan (AZOR) 10-40 MG tablet, Take 1 tablet by mouth daily., Disp: , Rfl:    atorvastatin  (LIPITOR ) 80 MG tablet, Take 1 tablet (80 mg total) by mouth daily., Disp: 90 tablet, Rfl: 3   Cholecalciferol  (VITAMIN D3) 250 MCG (10000 UT) capsule, Take 10,000 Units by mouth daily., Disp: , Rfl:    collagenase  (SANTYL ) 250 UNIT/GM ointment, Apply 1 Application topically daily., Disp: 15 g, Rfl: 0   dicyclomine  (BENTYL ) 20 MG tablet, Take 40 mg by mouth 2 (two) times daily., Disp: , Rfl:    diphenhydramine-acetaminophen  (TYLENOL  PM) 25-500 MG TABS tablet, Take 1 tablet by mouth at bedtime as needed (sleep, pain)., Disp: , Rfl:    diphenoxylate -atropine  (LOMOTIL ) 2.5-0.025 MG tablet, Take 3 tablets by mouth 2 (two) times daily., Disp: , Rfl:    DULoxetine  (CYMBALTA ) 60 MG capsule, Take 60 mg by mouth 2 (two) times daily., Disp: , Rfl:    eszopiclone (LUNESTA) 1 MG TABS tablet, Take 1 mg by mouth at bedtime as needed for sleep., Disp: , Rfl:    fluticasone  (FLONASE ) 50 MCG/ACT nasal spray, Place 2 sprays into the nose daily., Disp: 16 g, Rfl: 11    gabapentin  (NEURONTIN ) 300 MG capsule, Take 1 capsule (300 mg total) by mouth 2 (two) times daily., Disp: 180 capsule, Rfl: 1   ibuprofen  (ADVIL ) 800 MG tablet, Take 1 tablet (800 mg total) by mouth every 6 (six) hours as needed., Disp: 60 tablet, Rfl: 1   Ibuprofen -Acetaminophen  (ADVIL  DUAL ACTION) 125-250 MG TABS, Take 2 tablets by mouth 2 (two) times daily as needed (pain, headache)., Disp: , Rfl:    loperamide  (IMODIUM  A-D) 2 MG tablet, Take 2 mg by mouth 2 (two) times daily., Disp: , Rfl:    Melatonin 10 MG TABS, Take 10-20 mg by mouth at bedtime as needed (sleep)., Disp: , Rfl:    montelukast  (SINGULAIR ) 10 MG tablet, Take 10 mg by mouth at bedtime., Disp: , Rfl:    Multiple Vitamins-Minerals (HAIR SKIN & NAILS) TABS, Take 1 tablet by mouth 2 (two) times daily., Disp: , Rfl:    ocrelizumab  (OCREVUS ) 300 MG/10ML injection, Inject 20 mLs (600 mg total) into the vein every 6 (six) months., Disp: 20 mL, Rfl: 1   oxyCODONE  (ROXICODONE ) 15 MG immediate release tablet, Take 15 mg by mouth every 4 (four) hours as needed for pain. May take up to a total of 6 times per day if needed, Disp: , Rfl:    pantoprazole  (PROTONIX ) 40 MG tablet, Take 40 mg by mouth 2 (two) times daily., Disp: , Rfl:    tirzepatide (MOUNJARO) 7.5 MG/0.5ML Pen, Inject 7.5 mg into the skin every Wednesday., Disp: , Rfl:    tiZANidine  (ZANAFLEX ) 4 MG tablet, One po qAM, one po qPM, two po qHS, Disp: 120 tablet, Rfl: 1   VIBERZI  100 MG TABS, Take 100 mg by mouth 2 (two) times daily., Disp: , Rfl:    loperamide  (IMODIUM ) 2 MG capsule, Take 1 capsule (2 mg total) by mouth 2 (two) times daily. (Patient not taking: Reported on 01/10/2024), Disp: 30 capsule, Rfl: 0  NARCAN  4 MG/0.1ML LIQD nasal spray kit, Place 1 spray into the nose once as needed (overdose). (Patient not taking: Reported on 01/10/2024), Disp: , Rfl:    vitamin B-12 (CYANOCOBALAMIN ) 500 MCG tablet, Take 1,000 mcg by mouth daily. (Patient not taking: Reported on 01/10/2024),  Disp: , Rfl:   PAST MEDICAL HISTORY: Past Medical History:  Diagnosis Date   Acute cystitis    Acute metabolic encephalopathy    Anemia    Arthritis    Chronic pain    Diabetes mellitus    Fibromyalgia    GERD (gastroesophageal reflux disease)    Hepatitis C    received treatment   History of methicillin resistant staphylococcus aureus (MRSA)    Hypercholesteremia    Hypertension    IBS (irritable bowel syndrome)    Multiple sclerosis exacerbation (HCC)    Neurogenic bladder    pt denies having to cath herself-says she voids on her own as of 11-21-21   Neuropathy    Obesity    Sacral nerve stimulator present    battery is dead and has not worked in years   Stroke [facility])    pt denies this but pcp has it in their history   Tachycardia    UTI (urinary tract infection)    Vitamin D  deficiency     PAST SURGICAL HISTORY: Past Surgical History:  Procedure Laterality Date   ABDOMINAL HYSTERECTOMY     partial   I & D EXTREMITY Right 12/14/2020   Procedure: IRRIGATION AND DEBRIDEMENT RIGHT LOWER EXTREMITY;  Surgeon: Fidel Rogue, MD;  Location: [facility] OR;  Service: Orthopedics;  Laterality: Right;   JOINT REPLACEMENT     LTK   ORIF ANKLE FRACTURE Left 11/25/2021   Procedure: OPEN REDUCTION INTERNAL FIXATION (ORIF) LEFT ANKLE FRACTURE;  Surgeon: Tobie Franky SQUIBB, DPM;  Location: [facility] ORS;  Service: Orthopedics;  Laterality: Left;  POPLITEAL BLOCK   RADIOLOGY WITH ANESTHESIA N/A 02/18/2021   Procedure: MRI WITH ANESTHESIA CERVICAL SPINE WITH AND WITHOUT CONTRAST AND BRAIN WITH AND WITHOUT CONTRAST;  Surgeon: Radiologist, Medication, MD;  Location: [facility] OR;  Service: Radiology;  Laterality: N/A;   RADIOLOGY WITH ANESTHESIA N/A 07/07/2021   Procedure: MRI WITH ANESTHESIA;  Surgeon: Radiologist, Medication, MD;  Location: [facility] OR;  Service: Radiology;  Laterality: N/A;   RADIOLOGY WITH ANESTHESIA N/A 08/18/2022   Procedure: MRI BRAIN WITH AND WITHOUT CONTRAST; MRI CERVICAL SPINE WITHE AND WITOUT  CONTRAST WITH ANESTHESIA;  Surgeon: Radiologist, Medication, MD;  Location: [facility] OR;  Service: Radiology;  Laterality: N/A;   RADIOLOGY WITH ANESTHESIA N/A 06/26/2023   Procedure: MRI WITH ANESTHESIA;  Surgeon: Radiologist, Medication, MD;  Location: [facility] OR;  Service: Radiology;  Laterality: N/A;   SACRAL NERVE STIMULATOR PLACEMENT     [employer]   SYNDESMOSIS REPAIR Left 11/25/2021   Procedure: SYNDESMOSIS REPAIR;  Surgeon: Tobie Franky SQUIBB, DPM;  Location: [facility] ORS;  Service: Orthopedics;  Laterality: Left;   TUBAL LIGATION      FAMILY HISTORY: Family History  Problem Relation Age of Onset   Diabetes Mother    Hypertension Mother    Stroke Father     SOCIAL HISTORY:  Social History   Socioeconomic History   Marital status: Single    Spouse name: Not on file   Number of children: 2   Years of education: Not on file   Highest education level: Not on file  Occupational History   Not on file  Tobacco Use   Smoking status: Former    Current  packs/day: 0.00    Average packs/day: 0.3 packs/day for 12.0 years (3.0 ttl pk-yrs)    Types: Cigarettes    Start date: 03/2009    Quit date: 03/2021    Years since quitting: 2.8   Smokeless tobacco: Never   Tobacco comments:    06/13/20 1 pk per 3 days  Vaping Use   Vaping status: Never Used  Substance and Sexual Activity   Alcohol use: No   Drug use: No   Sexual activity: Not on file  Other Topics Concern   Not on file  Social History Narrative   06/13/20 lives alone, dgtr across the street   Social Drivers of Health   Financial Resource Strain: Not on file  Food Insecurity: Not on file  Transportation Needs: Not on file  Physical Activity: Not on file  Stress: Not on file  Social Connections: Not on file  Intimate Partner Violence: Not on file     PHYSICAL EXAM  Vitals:   01/10/24 0926  BP: (!) 87/60  Pulse: 90  SpO2: 96%  Weight: 263 lb 8 oz (119.5 kg)  Height: 5' 3 (1.6 m)     Body mass index is 46.68  kg/m.   General: The patient is well-developed and well-nourished and in no acute distress  HEENT:  Head is [place]/AT.  Sclera are anicteric.     Skin: Extremities are without rash.  There is mild ankle edema Musculoskeletal: She has tenderness in the cervical and lumbar paraspinal muscles.   Some piriformis tendernss.  Neurologic Exam  Mental status: The patient is alert and oriented x 3 at the time of the examination. The patient has apparent normal recent and remote memory, with an apparently normal attention span and concentration ability.   Speech is normal.  Cranial nerves: Extraocular movements are full.  Facial strength and sensation was normal.  No obvious hearing deficits are noted.  Motor:  Minimal hand intention tremor.  Muscle bulk is normal.   Tone is increased in legs., left slightly more than right  . Strength is  5 / 5 in all 4 extremities except 4+/5 ankles,4/5 toes,.  Strength is a little worse on the left than the right..   Sensory: She report  reduced sensation to touch and vibration in the left arm and leg relative to the right .  Decreased sensation to vibration in the toes and ankles and knees compared to the hands  Coordination: Cerebellar testing reveals good finger-nose-finger and heel-to-shin bilaterally.  Gait and station: Station is normal.  Can take couple ataxic steps without a cane. Has left foot drop  She has a reduced stride that is mildly wide.  The gait is also arthritic.SABRA  She is unable to do a  tandem walk.  Romberg is negative.   Reflexes: Deep tendon reflexes are 1 and symmetric in the limbs.       DIAGNOSTIC DATA (LABS, IMAGING, TESTING) - I reviewed patient records, labs, notes, testing and imaging myself where available.  Lab Results  Component Value Date   WBC 8.3 08/04/2023   HGB 14.2 08/04/2023   HCT 42.9 08/04/2023   MCV 89 08/04/2023   PLT 271 08/04/2023      Component Value Date/Time   NA 140 06/25/2023 1411   NA 143 03/25/2022  1030   K 3.2 (L) 06/25/2023 1411   CL 106 06/25/2023 1411   CO2 24 06/25/2023 1411   GLUCOSE 110 (H) 06/25/2023 1411   BUN 11 06/25/2023 1411  BUN 11 03/25/2022 1030   CREATININE 0.73 06/25/2023 1411   CREATININE 0.85 02/24/2012 1044   CALCIUM  9.7 06/25/2023 1411   PROT 7.5 03/25/2022 1030   ALBUMIN 4.8 03/25/2022 1030   AST 17 03/25/2022 1030   ALT 16 03/25/2022 1030   ALKPHOS 136 (H) 03/25/2022 1030   BILITOT 0.3 03/25/2022 1030   GFRNONAA >60 06/25/2023 1411   GFRAA >60 11/17/2019 1242   Lab Results  Component Value Date   CHOL (H) 02/19/2009    237        ATP III CLASSIFICATION:  <200     mg/dL   Desirable  799-760  mg/dL   Borderline High  >=759    mg/dL   High          HDL 52 02/19/2009   LDLCALC (H) 02/19/2009    135        Total Cholesterol/HDL:CHD Risk Coronary Heart Disease Risk Table                     Men   Women  1/2 Average Risk   3.4   3.3  Average Risk       5.0   4.4  2 X Average Risk   9.6   7.1  3 X Average Risk  23.4   11.0        Use the calculated Patient Ratio above and the CHD Risk Table to determine the patient's CHD Risk.        ATP III CLASSIFICATION (LDL):  <100     mg/dL   Optimal  899-870  mg/dL   Near or Above                    Optimal  130-159  mg/dL   Borderline  839-810  mg/dL   High  >809     mg/dL   Very High   TRIG 750 (H) 02/19/2009   CHOLHDL 4.6 02/19/2009   Lab Results  Component Value Date   HGBA1C 5.5 07/06/2021   Lab Results  Component Value Date   VITAMINB12 1,987 (H) 03/25/2022   Lab Results  Component Value Date   TSH 1.080 10/23/2021       ASSESSMENT AND PLAN  Multiple sclerosis (HCC)  Gait disturbance  High risk medication use  Other chronic pain  Diabetic peripheral neuropathy (HCC)  Neck pain  Dysesthesia  Left foot drop  Other fatigue    1.    Prescriptin for scooter due to MS, poor gait, leg spasticity, other fatige 2.    Continue Ocrevus .  IgG and IgM were normal in  March 2025. 2.     tizanidine  to 4 mg po tid (12 mg/day).   Continue gabapentin  to 300 mg po tid 3.    Encouraged weight loss.   Advised to eat healthy.  Continue vitamin D . 4.    Return in 6 months or sooner if there are new or worsening neurologic symptoms.   This visit is part of a comprehensive longitudinal care medical relationship regarding the patients primary diagnosis of MS and related concerns.  40-minute office visit with the majority of the time spent face-to-face for history and physical, discussion/counseling and decision-making.  Additional time with record review and documentation.   Tazaria Dlugosz A. Vear, MD, [facility] 01/10/2024, 9:57 AM Certified in Neurology, Clinical Neurophysiology, Sleep Medicine and Neuroimaging  [facility] [address] [place], [place] [address] [phone]

## 2024-01-25 ENCOUNTER — Telehealth: Payer: Self-pay | Admitting: Neurology

## 2024-01-25 NOTE — Telephone Encounter (Signed)
 Called pt. Relayed he should contact DME: [employer] who handles getting approval via insurance for scooter. She verbalized understanding and will contact [employer].

## 2024-01-25 NOTE — Telephone Encounter (Signed)
 Pt called to informed that a PA is needed  for scooter , Pt insurance informed  that a PA IS needed.  Pt also stated if you  have any question to call pt

## 2024-01-31 ENCOUNTER — Telehealth: Payer: Self-pay | Admitting: Neurology

## 2024-01-31 DIAGNOSIS — M21372 Foot drop, left foot: Secondary | ICD-10-CM

## 2024-01-31 DIAGNOSIS — G35 Multiple sclerosis: Secondary | ICD-10-CM

## 2024-01-31 DIAGNOSIS — R269 Unspecified abnormalities of gait and mobility: Secondary | ICD-10-CM

## 2024-01-31 DIAGNOSIS — R5383 Other fatigue: Secondary | ICD-10-CM

## 2024-01-31 NOTE — Telephone Encounter (Signed)
 Sent community message back to [employer] asking they contact pt to go over things. Waiting on response.

## 2024-01-31 NOTE — Telephone Encounter (Signed)
 Called pt and informed her of this information. Pt stated that she spoke to [employer] this morning and they took her information and knew what ins she had and that they told her that she was good to go. Pt went to look at when she was on the phone with them earlier today but call was disconnected. Please advise.

## 2024-01-31 NOTE — Telephone Encounter (Signed)
 Pt last seen by Dr. Vear 01/10/24. Looks like Dr. Vear did provide rx for scooter to pt at this visit. Rx not in EPIC. Placed rx in EPIC. Sent community message to [employer] that rx placed and they can also pull office visit notes as well.

## 2024-01-31 NOTE — Telephone Encounter (Signed)
 Phone room: please call pt and let her know we have sent message to [employer] to call and discuss further with her since we are getting different information. Orders were sent to them

## 2024-01-31 NOTE — Telephone Encounter (Signed)
 Phone room: please call pt back. [employer] unable to accept order. States her insurance out of network. Pt should call insurance to find out what DME (medical equipment company) takes her insurance and call back to let us  know

## 2024-01-31 NOTE — Telephone Encounter (Signed)
 Patient said, [employer] needs order for scooter and medical notes of why need the motorized scooter.  If have any questions can give me a call. [employer] Phone: [phone] Fax: [phone]

## 2024-03-07 ENCOUNTER — Encounter: Payer: Self-pay | Admitting: Neurology

## 2024-03-07 ENCOUNTER — Ambulatory Visit (INDEPENDENT_AMBULATORY_CARE_PROVIDER_SITE_OTHER): Admitting: Neurology

## 2024-03-07 VITALS — BP 103/67 | HR 91 | Ht 63.0 in | Wt 272.5 lb

## 2024-03-07 DIAGNOSIS — G8929 Other chronic pain: Secondary | ICD-10-CM | POA: Insufficient documentation

## 2024-03-07 DIAGNOSIS — Z7985 Long-term (current) use of injectable non-insulin antidiabetic drugs: Secondary | ICD-10-CM

## 2024-03-07 DIAGNOSIS — G35C1 Active secondary progressive multiple sclerosis: Secondary | ICD-10-CM | POA: Insufficient documentation

## 2024-03-07 DIAGNOSIS — M21372 Foot drop, left foot: Secondary | ICD-10-CM

## 2024-03-07 DIAGNOSIS — R5383 Other fatigue: Secondary | ICD-10-CM

## 2024-03-07 DIAGNOSIS — R269 Unspecified abnormalities of gait and mobility: Secondary | ICD-10-CM | POA: Diagnosis not present

## 2024-03-07 DIAGNOSIS — Z79899 Other long term (current) drug therapy: Secondary | ICD-10-CM

## 2024-03-07 DIAGNOSIS — M5442 Lumbago with sciatica, left side: Secondary | ICD-10-CM

## 2024-03-07 DIAGNOSIS — M5441 Lumbago with sciatica, right side: Secondary | ICD-10-CM

## 2024-03-07 DIAGNOSIS — M542 Cervicalgia: Secondary | ICD-10-CM | POA: Diagnosis not present

## 2024-03-07 DIAGNOSIS — E1142 Type 2 diabetes mellitus with diabetic polyneuropathy: Secondary | ICD-10-CM

## 2024-03-07 MED ORDER — KETOROLAC TROMETHAMINE 60 MG/2ML IM SOLN
60.0000 mg | Freq: Once | INTRAMUSCULAR | Status: AC
  Administered 2024-03-07: 60 mg via INTRAMUSCULAR

## 2024-03-07 MED ORDER — GABAPENTIN 600 MG PO TABS: 600.0000 mg | ORAL_TABLET | Freq: Three times a day (TID) | ORAL | 11 refills | Status: AC

## 2024-03-07 NOTE — Progress Notes (Signed)
 [facility]  PATIENT: [name] DOB: [birth year]  REFERRING DOCTOR OR PCP: Leita Norse, FNP SOURCE: Patient, notes from primary care, imaging and lab reports, MRI images personally reviewed.  _________________________________   HISTORICAL  CHIEF COMPLAINT:  Chief Complaint  Patient presents with   Follow-up    Pt in room 10. Alone. Here for MS follow up. Pt reports new concerns.    HISTORY OF PRESENT ILLNESS:  [name] is a 62 y.o. woman with multiple sclerosis, LBP and gait disturbance  Update 03/07/2024: Mobility:  She is currently using a scooter due to poor mobility from her MS.  It is old and not working well.   Therefore she needs a new one.  The main purpose of this visit is to evaluate and treat mobility issues  Current mobility issues:   Due to MS she has left > right leg weakness affecting gait.    She uses a scooter in her home due to the leg weakness affecting gait.  She has stumbles but no recent falls.  She has left > right leg spasticity    She can not walk without a cane.  With a cane, she  can walk > 20 feet if not tired but only across the room if tired.  She is having difficulty doing activities of daily living due to the reduced gait..   We tried dalfampridine  but it did not help.  Amantadine  has not helped     She is on tizanidine  (felt spasticity better on combination of baclofen  and tizanidine  but felt sleepy).   Fatigue from MS is often severe.    Musculoskeletal pain also hinders her gait (neck/back/legs)     Mobility Needs:  She cannot use a cane or walker due to inability to consistently and safely walk.  Due to fatigue, she cannot wheel a self propelled wheelchair.  Therefore she needs a powered vehicle.  A scooter can meet her mobility needs and she can safely operate in her home.     MS  She is on Ocrevus   and tolerates it well..  Her last infusion was December 18 2023 and last infusion was 12/18/2022.  She has tolerated the  infusions well.    She has NIDDM and is on Mounjaro (has also lost a couple pounds).      She reports more difficulty with her vision.  She sometimes has diplopia.  Even with glasses, she feels vision is poor.   She may have early glaucoma an sees ophthalmology.    She continues to note burning dysesthetic pain in the arms and legs. Her feet have a burning.    Lamotrigine  and oxcarbazepine  help the dysesthesias some.  Gabapentin  had not helped pain and she gained weight on it .  Her vision is doing about the same.  She has urinary urgency and frequency.  She is reporting more numbness/pain and weakness in her left > right hands.  She sometimes wakes up with dysesthesias at night.  Heat/ice and ointments have not helped.    Moving/shaking does not improve the pain.    She takes gabapentin  600 mg po bid.  She is also on oxycodone  15 mg (Pain Management)  Other:    She is reporting more pain -- neck pain/stiffness (L>R), hand pain, leg spasms and pain.  Also having more back pain - known DJD and sees pain management.      To help pain 400 mg po gabapentin  tid and oxycodone  15 mg x  5/day.   For spasms, she takes tizanidone 4 mg po 1-1-2 (16 mg/day).    .She also has  intermittent stabbing pain in the left leg that is very painful.   She is on  gabapentin  with some benefit.  She stopped lamotrigine  as it had not helped her.      Besides MS she has NIDDM (was on insulin , now on Mounjaro and lost 10-15 pounds).     Mood is doing well and she denies depression or anxiety.    She is on Cymbalta  60 mg po qd and lamotrigine  200 mg po bid.     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  She is on eszopiclone   She sees [facility] for Pain Management and other controlled substances.     She has neck pain that is worse.   Pain increases with movements.  Left=right.     For her musculoskeletal pain, she sees [facility] and is on  oxycodone  15 mg q6.  Theuy also write for  weight losss.  She had MBBB/RFA helped the right side but not the left side.  Surgery has been discussed with her.   She is also on tizanidine , baclofen  and amitriptyline  to help her pain.   She sees Dr. Saullo for procedures..   MS HISTORY She was diagnosed with MS in 2008 after presenting with leg weakness and headache, neck pain, back pain.  MRIs showed foci in her cervical spine and brain.   She was started on Rebif.   Initially she did well but she reports having a lot of relapses.   She was switched to Copaxone but also had more symptoms.   She was then switched to Tysabri for several years but reports having several relapses.   She switched to Tecfidera  5 years ago.   She reports having a flare in 2015 and had 5 days of IV Solu-medrol .   July 2021, she was complaining of blurry vision, stiff neck and increased burning and tingling a few weeks ago.  She went to the ED and had MRIs performed showing no new lesions.    She switched to Ocrevus  August 2023  IMAGING: MRI of the brain and cervical spine 06/26/2023 showed no new MS lesions in brain or spine..    DJD, with worse levels C5-C6 (severe biforaminal stenosis) and C6-C7 (right foraminal stenosis)  MRI of the cervical spine 11/18/2019 and 03/02/2012 showed two T2 hyperintense foci adjacent to C2 and C3-C4.  There was mild multilevel degenerative changes as well.    MRI of the brain 11/18/2019 and 03/02/2020 show a fairly low plaque burden of T2/FLAIR hyperintense foci.  Most of them are nonspecific.  A few are periventricular.  None acute and no change over time.  MRI of the cervical spine 02/18/2021 showed no new lesions an stable DJD with C5C6 and C6C7 mild spinal senosis  MRI of the brain 02/18/2021 showed no change compared to 11/18/2019  MR brain and spinal cord 08/18/2022 showed no new lesions  REVIEW OF SYSTEMS: Constitutional: No fevers, chills, sweats, or change in appetite.  She has fatigue and she reports insomnia Eyes: No visual changes,  double vision, eye pain Ear, nose and throat: No hearing loss, ear pain, nasal congestion, sore throat Cardiovascular: No chest pain, palpitations Respiratory:  No shortness of breath at rest or with exertion.   No wheezes GastrointestinaI: No nausea, vomiting, diarrhea, abdominal pain, fecal incontinence Genitourinary:  No dysuria, urinary retention or  frequency.  No nocturia. Musculoskeletal: Pain all over in muscles, joints and spine Integumentary: No rash, pruritus, skin lesions Neurological: as above Psychiatric: She has depression and anxiety Endocrine: No palpitations, diaphoresis, change in appetite, change in weigh or increased thirst Hematologic/Lymphatic:  No anemia, purpura, petechiae. Allergic/Immunologic: No itchy/runny eyes, nasal congestion, recent allergic reactions, rashes  ALLERGIES: No Known Allergies  HOME MEDICATIONS:  Current Outpatient Medications:    amLODipine -olmesartan (AZOR) 10-40 MG tablet, Take 1 tablet by mouth daily., Disp: , Rfl:    atorvastatin  (LIPITOR ) 80 MG tablet, Take 1 tablet (80 mg total) by mouth daily., Disp: 90 tablet, Rfl: 3   Cholecalciferol  (VITAMIN D3) 250 MCG (10000 UT) capsule, Take 10,000 Units by mouth daily., Disp: , Rfl:    dicyclomine  (BENTYL ) 20 MG tablet, Take 40 mg by mouth 2 (two) times daily., Disp: , Rfl:    diphenhydramine-acetaminophen  (TYLENOL  PM) 25-500 MG TABS tablet, Take 1 tablet by mouth at bedtime as needed (sleep, pain)., Disp: , Rfl:    diphenoxylate -atropine  (LOMOTIL ) 2.5-0.025 MG tablet, Take 3 tablets by mouth 2 (two) times daily., Disp: , Rfl:    DULoxetine  (CYMBALTA ) 60 MG capsule, Take 60 mg by mouth 2 (two) times daily., Disp: , Rfl:    eszopiclone (LUNESTA) 1 MG TABS tablet, Take 1 mg by mouth at bedtime as needed for sleep., Disp: , Rfl:    fluticasone  (FLONASE ) 50 MCG/ACT nasal spray, Place 2 sprays into the nose daily., Disp: 16 g, Rfl: 11   gabapentin  (NEURONTIN ) 600 MG tablet, Take 1 tablet (600 mg  total) by mouth 3 (three) times daily., Disp: 90 tablet, Rfl: 11   ibuprofen  (ADVIL ) 800 MG tablet, Take 1 tablet (800 mg total) by mouth every 6 (six) hours as needed., Disp: 60 tablet, Rfl: 1   Ibuprofen -Acetaminophen  (ADVIL  DUAL ACTION) 125-250 MG TABS, Take 2 tablets by mouth 2 (two) times daily as needed (pain, headache)., Disp: , Rfl:    loperamide  (IMODIUM  A-D) 2 MG tablet, Take 2 mg by mouth 2 (two) times daily., Disp: , Rfl:    loperamide  (IMODIUM ) 2 MG capsule, Take 1 capsule (2 mg total) by mouth 2 (two) times daily., Disp: 30 capsule, Rfl: 0   Melatonin 10 MG TABS, Take 10-20 mg by mouth at bedtime as needed (sleep)., Disp: , Rfl:    montelukast  (SINGULAIR ) 10 MG tablet, Take 10 mg by mouth at bedtime., Disp: , Rfl:    Multiple Vitamins-Minerals (HAIR SKIN & NAILS) TABS, Take 1 tablet by mouth 2 (two) times daily., Disp: , Rfl:    NARCAN  4 MG/0.1ML LIQD nasal spray kit, Place 1 spray into the nose once as needed (overdose)., Disp: , Rfl:    ocrelizumab  (OCREVUS ) 300 MG/10ML injection, Inject 20 mLs (600 mg total) into the vein every 6 (six) months., Disp: 20 mL, Rfl: 1   oxyCODONE  (ROXICODONE ) 15 MG immediate release tablet, Take 15 mg by mouth every 4 (four) hours as needed for pain. May take up to a total of 6 times per day if needed, Disp: , Rfl:    pantoprazole  (PROTONIX ) 40 MG tablet, Take 40 mg by mouth 2 (two) times daily., Disp: , Rfl:    phentermine (ADIPEX-P) 37.5 MG tablet, Take 37.5 mg by mouth daily., Disp: , Rfl:    tirzepatide (MOUNJARO) 7.5 MG/0.5ML Pen, Inject 7.5 mg into the skin every Wednesday., Disp: , Rfl:    tiZANidine  (ZANAFLEX ) 4 MG tablet, One po qAM, one po qPM, two po qHS, Disp: 120 tablet, Rfl:  1   VIBERZI  100 MG TABS, Take 100 mg by mouth 2 (two) times daily., Disp: , Rfl:    vitamin B-12 (CYANOCOBALAMIN ) 500 MCG tablet, Take 1,000 mcg by mouth daily., Disp: , Rfl:    collagenase  (SANTYL ) 250 UNIT/GM ointment, Apply 1 Application topically daily. (Patient  not taking: Reported on 03/07/2024), Disp: 15 g, Rfl: 0  PAST MEDICAL HISTORY: Past Medical History:  Diagnosis Date   Acute cystitis    Acute metabolic encephalopathy    Anemia    Arthritis    Chronic pain    Diabetes mellitus    Fibromyalgia    GERD (gastroesophageal reflux disease)    Hepatitis C    received treatment   History of methicillin resistant staphylococcus aureus (MRSA)    Hypercholesteremia    Hypertension    IBS (irritable bowel syndrome)    Multiple sclerosis exacerbation    Neurogenic bladder    pt denies having to cath herself-says she voids on her own as of 11-21-21   Neuropathy    Obesity    Sacral nerve stimulator present    battery is dead and has not worked in years   Stroke [facility])    pt denies this but pcp has it in their history   Tachycardia    UTI (urinary tract infection)    Vitamin D  deficiency     PAST SURGICAL HISTORY: Past Surgical History:  Procedure Laterality Date   ABDOMINAL HYSTERECTOMY     partial   I & D EXTREMITY Right 12/14/2020   Procedure: IRRIGATION AND DEBRIDEMENT RIGHT LOWER EXTREMITY;  Surgeon: Fidel Rogue, MD;  Location: [facility] OR;  Service: Orthopedics;  Laterality: Right;   JOINT REPLACEMENT     LTK   ORIF ANKLE FRACTURE Left 11/25/2021   Procedure: OPEN REDUCTION INTERNAL FIXATION (ORIF) LEFT ANKLE FRACTURE;  Surgeon: Tobie Franky SQUIBB, DPM;  Location: [facility] ORS;  Service: Orthopedics;  Laterality: Left;  POPLITEAL BLOCK   RADIOLOGY WITH ANESTHESIA N/A 02/18/2021   Procedure: MRI WITH ANESTHESIA CERVICAL SPINE WITH AND WITHOUT CONTRAST AND BRAIN WITH AND WITHOUT CONTRAST;  Surgeon: Radiologist, Medication, MD;  Location: [facility] OR;  Service: Radiology;  Laterality: N/A;   RADIOLOGY WITH ANESTHESIA N/A 07/07/2021   Procedure: MRI WITH ANESTHESIA;  Surgeon: Radiologist, Medication, MD;  Location: [facility] OR;  Service: Radiology;  Laterality: N/A;   RADIOLOGY WITH ANESTHESIA N/A 08/18/2022   Procedure: MRI BRAIN WITH AND WITHOUT CONTRAST;  MRI CERVICAL SPINE WITHE AND WITOUT CONTRAST WITH ANESTHESIA;  Surgeon: Radiologist, Medication, MD;  Location: [facility] OR;  Service: Radiology;  Laterality: N/A;   RADIOLOGY WITH ANESTHESIA N/A 06/26/2023   Procedure: MRI WITH ANESTHESIA;  Surgeon: Radiologist, Medication, MD;  Location: [facility] OR;  Service: Radiology;  Laterality: N/A;   SACRAL NERVE STIMULATOR PLACEMENT     [employer]   SYNDESMOSIS REPAIR Left 11/25/2021   Procedure: SYNDESMOSIS REPAIR;  Surgeon: Tobie Franky SQUIBB, DPM;  Location: [facility] ORS;  Service: Orthopedics;  Laterality: Left;   TUBAL LIGATION      FAMILY HISTORY: Family History  Problem Relation Age of Onset   Diabetes Mother    Hypertension Mother    Stroke Father     SOCIAL HISTORY:  Social History   Socioeconomic History   Marital status: Single    Spouse name: Not on file   Number of children: 2   Years of education: Not on file   Highest education level: Not on file  Occupational History   Not on file  Tobacco Use  Smoking status: Former    Current packs/day: 0.00    Average packs/day: 0.3 packs/day for 12.0 years (3.0 ttl pk-yrs)    Types: Cigarettes    Start date: 03/2009    Quit date: 03/2021    Years since quitting: 2.9   Smokeless tobacco: Never   Tobacco comments:    06/13/20 1 pk per 3 days  Vaping Use   Vaping status: Never Used  Substance and Sexual Activity   Alcohol use: No   Drug use: No   Sexual activity: Not on file  Other Topics Concern   Not on file  Social History Narrative   06/13/20 lives alone, dgtr across the street   Social Drivers of Health   Financial Resource Strain: Not on file  Food Insecurity: Not on file  Transportation Needs: Not on file  Physical Activity: Not on file  Stress: Not on file  Social Connections: Not on file  Intimate Partner Violence: Not on file     PHYSICAL EXAM  Vitals:   03/07/24 0953  BP: 103/67  Pulse: 91  Weight: 272 lb 8 oz (123.6 kg)  Height: 5' 3 (1.6 m)     Body mass index  is 48.27 kg/m.   General: The patient is well-developed and well-nourished and in no acute distress  HEENT:  Head is [place]/AT.  Sclera are anicteric.     Skin: Extremities are without rash.  There is mild ankle edema Musculoskeletal: She has tenderness in the cervical and lumbar paraspinal muscles.   Some piriformis tendernss.  Neurologic Exam  Mental status: The patient is alert and oriented x 3 at the time of the examination. The patient has apparent normal recent and remote memory, with an apparently normal attention span and concentration ability.   Speech is normal.  Cranial nerves: Extraocular movements are full.  Facial strength and sensation was normal.  No obvious hearing deficits are noted.  Motor:  Minimal hand intention tremor.  Muscle bulk is normal.   Tone is increased in legs., left slightly more than right  . Strength is  5 / 5 in all 4 extremities except 4+/5 ankles,4/5 toes,.  Strength is a little worse on the left than the right..   Sensory: Mild Tinel's signs.  No Phalens sign.  She report  reduced sensation to touch and vibration in the left arm and leg relative to the right .  Decreased sensation to vibration in the toes and ankles and knees compared to the hands  Coordination: Cerebellar testing reveals good finger-nose-finger and heel-to-shin bilaterally.  Gait and station: Station is normal.  Can take couple ataxic steps without a cane.  She has a left  foot drop  She has a reduced stride that is mildly wide.  The gait is also arthritic.SABRA  She is unable to do a  tandem walk.  Romberg is negative.   Reflexes: Deep tendon reflexes are 1 and symmetric in the limbs.       DIAGNOSTIC DATA (LABS, IMAGING, TESTING) - I reviewed patient records, labs, notes, testing and imaging myself where available.  Lab Results  Component Value Date   WBC 8.3 08/04/2023   HGB 14.2 08/04/2023   HCT 42.9 08/04/2023   MCV 89 08/04/2023   PLT 271 08/04/2023      Component Value  Date/Time   NA 140 06/25/2023 1411   NA 143 03/25/2022 1030   K 3.2 (L) 06/25/2023 1411   CL 106 06/25/2023 1411   CO2 24 06/25/2023  1411   GLUCOSE 110 (H) 06/25/2023 1411   BUN 11 06/25/2023 1411   BUN 11 03/25/2022 1030   CREATININE 0.73 06/25/2023 1411   CREATININE 0.85 02/24/2012 1044   CALCIUM  9.7 06/25/2023 1411   PROT 7.5 03/25/2022 1030   ALBUMIN 4.8 03/25/2022 1030   AST 17 03/25/2022 1030   ALT 16 03/25/2022 1030   ALKPHOS 136 (H) 03/25/2022 1030   BILITOT 0.3 03/25/2022 1030   GFRNONAA >60 06/25/2023 1411   GFRAA >60 11/17/2019 1242   Lab Results  Component Value Date   CHOL (H) 02/19/2009    237        ATP III CLASSIFICATION:  <200     mg/dL   Desirable  799-760  mg/dL   Borderline High  >=759    mg/dL   High          HDL 52 02/19/2009   LDLCALC (H) 02/19/2009    135        Total Cholesterol/HDL:CHD Risk Coronary Heart Disease Risk Table                     Men   Women  1/2 Average Risk   3.4   3.3  Average Risk       5.0   4.4  2 X Average Risk   9.6   7.1  3 X Average Risk  23.4   11.0        Use the calculated Patient Ratio above and the CHD Risk Table to determine the patient's CHD Risk.        ATP III CLASSIFICATION (LDL):  <100     mg/dL   Optimal  899-870  mg/dL   Near or Above                    Optimal  130-159  mg/dL   Borderline  839-810  mg/dL   High  >809     mg/dL   Very High   TRIG 750 (H) 02/19/2009   CHOLHDL 4.6 02/19/2009   Lab Results  Component Value Date   HGBA1C 5.5 07/06/2021   Lab Results  Component Value Date   VITAMINB12 1,987 (H) 03/25/2022   Lab Results  Component Value Date   TSH 1.080 10/23/2021       ASSESSMENT AND PLAN  Active secondary progressive multiple sclerosis - Plan: For home use only DME Other see comment, CBC with Differential/Platelets, Hepatic function panel, IgG, IgA, IgM  Gait disturbance - Plan: For home use only DME Other see comment  Left foot drop  Neck pain  High risk  medication use - Plan: CBC with Differential/Platelets, Hepatic function panel, IgG, IgA, IgM  Other fatigue  Diabetic peripheral neuropathy (HCC)  Chronic bilateral low back pain with bilateral sciatica    1.    Due to MS, she has reduced mobility.  The mobility needs can be met with a scooter, preferably a light weight 1.  We will send in a new Prescription for scooter due to MS, poor gait, leg spasticity, other fatige 2.    Continue Ocrevus .  IgG and IgM and CBC/D today.    2.    Continue tizanidine  to 4 mg po tid (12 mg/day).   Increase gabapentin  to 600 mg po tid 3.    Encouraged weight loss.   Advised to eat healthy.  Continue vitamin D . 4.    Return in 6 months or sooner if there are new  or worsening neurologic symptoms.   This visit is part of a comprehensive longitudinal care medical relationship regarding the patients primary diagnosis of MS and related concerns.   Kenslee Achorn A. Vear, MD, [facility] 03/07/2024, 10:47 AM Certified in Neurology, Clinical Neurophysiology, Sleep Medicine and Neuroimaging  [facility] [address], [address] [place], [place] [address] [phone]

## 2024-03-07 NOTE — Addendum Note (Signed)
 Addended by: IZELL HOUSEHOLDER R on: 03/07/2024 11:20 AM   Modules accepted: Orders

## 2024-03-08 ENCOUNTER — Ambulatory Visit: Payer: Self-pay | Admitting: Neurology

## 2024-03-08 ENCOUNTER — Telehealth: Payer: Self-pay | Admitting: *Deleted

## 2024-03-08 LAB — CBC WITH DIFFERENTIAL/PLATELET
Basophils Absolute: 0 x10E3/uL (ref 0.0–0.2)
Basos: 1 %
EOS (ABSOLUTE): 0.1 x10E3/uL (ref 0.0–0.4)
Eos: 2 %
Hematocrit: 40.5 % (ref 34.0–46.6)
Hemoglobin: 13.3 g/dL (ref 11.1–15.9)
Immature Grans (Abs): 0 x10E3/uL (ref 0.0–0.1)
Immature Granulocytes: 0 %
Lymphocytes Absolute: 1.9 x10E3/uL (ref 0.7–3.1)
Lymphs: 29 %
MCH: 29.8 pg (ref 26.6–33.0)
MCHC: 32.8 g/dL (ref 31.5–35.7)
MCV: 91 fL (ref 79–97)
Monocytes Absolute: 0.5 x10E3/uL (ref 0.1–0.9)
Monocytes: 8 %
Neutrophils Absolute: 4 x10E3/uL (ref 1.4–7.0)
Neutrophils: 60 %
Platelets: 244 x10E3/uL (ref 150–450)
RBC: 4.47 x10E6/uL (ref 3.77–5.28)
RDW: 13 % (ref 11.7–15.4)
WBC: 6.5 x10E3/uL (ref 3.4–10.8)

## 2024-03-08 LAB — HEPATIC FUNCTION PANEL
ALT: 16 IU/L (ref 0–32)
AST: 22 IU/L (ref 0–40)
Albumin: 4.3 g/dL (ref 3.9–4.9)
Alkaline Phosphatase: 100 IU/L (ref 49–135)
Bilirubin Total: 0.4 mg/dL (ref 0.0–1.2)
Bilirubin, Direct: 0.13 mg/dL (ref 0.00–0.40)
Total Protein: 6.5 g/dL (ref 6.0–8.5)

## 2024-03-08 LAB — IGG, IGA, IGM
IgA/Immunoglobulin A, Serum: 285 mg/dL (ref 87–352)
IgG (Immunoglobin G), Serum: 842 mg/dL (ref 586–1602)
IgM (Immunoglobulin M), Srm: 69 mg/dL (ref 26–217)

## 2024-03-08 NOTE — Telephone Encounter (Signed)
 Dr. Vear asked if faxed office note and order for Scooter for mobility to [facility]. [phone], fax# [phone]. Confirmation received.

## 2024-03-13 ENCOUNTER — Telehealth: Payer: Self-pay | Admitting: *Deleted

## 2024-03-13 NOTE — Telephone Encounter (Signed)
 Faxed completed/signed form, received fax confirmation.

## 2024-03-22 ENCOUNTER — Ambulatory Visit: Admitting: Podiatry

## 2024-03-22 DIAGNOSIS — M205X1 Other deformities of toe(s) (acquired), right foot: Secondary | ICD-10-CM

## 2024-03-22 NOTE — Progress Notes (Unsigned)
  Subjective:  Patient ID: [name], female    DOB: [birth year],  MRN: [id number]  Chief Complaint  Patient presents with   Routine Post Op    Pt stated that the bottom of her foot is causing her some pain    63 y.o. female returns today for planned flexor tenotomy of the right second digit digit.  Objective:  There were no vitals filed for this visit.  General AA&O x3. Normal mood and affect.  Vascular Pedal pulses palpable.  Neurologic Epicritic sensation grossly intact.  Dermatologic Pre-ulcerative callus at the tip of the right  second digit  Orthopedic: Semi-reducible hammertoe deformity right, 2nd toe    Assessment & Plan:  Patient was evaluated and treated and all questions answered.  Hammertoe right second with pre-ulcerative callus -Flexor tenotomy as below. -Advised to remove the dressing in 24 hours and apply a band-aid and triple abx ointment every day thereafter.  Procedure: Flexor Tenotomy Indication for Procedure: toe with semi-reducible hammertoe with distal tip ulceration. Flexor tenotomy indicated to alleviate contracture, reduce pressure, and enhance healing of the ulceration. Location: right, 2nd toe Anesthesia: Lidocaine  1% plain; 1.5 mL and Marcaine  0.5% plain; 1.5 mL digital block Instrumentation: 18 g needle  Technique: The toe was anesthetized as above and prepped in the usual fashion. The toe was exsanquinated and a tourniquet was secured at the base of the toe. An 18g needle was then used to percutaneously release the flexor tendon at the plantar surface of the toe with noted release of the hammertoe deformity. The incision was then dressed with antibiotic ointment and band-aid. Compression splint dressing applied. Patient tolerated the procedure well. Dressing: Dry, sterile, compression dressing. Disposition: Patient tolerated procedure well. Patient to return in 1 week for follow-up.      No follow-ups on file.

## 2024-03-23 ENCOUNTER — Ambulatory Visit: Attending: Neurology

## 2024-03-23 DIAGNOSIS — R2681 Unsteadiness on feet: Secondary | ICD-10-CM | POA: Insufficient documentation

## 2024-03-23 DIAGNOSIS — R262 Difficulty in walking, not elsewhere classified: Secondary | ICD-10-CM | POA: Insufficient documentation

## 2024-03-23 DIAGNOSIS — M6281 Muscle weakness (generalized): Secondary | ICD-10-CM | POA: Insufficient documentation

## 2024-03-23 DIAGNOSIS — R293 Abnormal posture: Secondary | ICD-10-CM | POA: Insufficient documentation

## 2024-03-23 NOTE — Therapy (Signed)
 OUTPATIENT PHYSICAL THERAPY WHEELCHAIR EVALUATION   Patient Name: [name] MRN: [id number] DOB:02/21/1961, 63 y.o., female Today's Date: 03/23/2024  END OF SESSION:  PT End of Session - 03/23/24 0841     Visit Number 1    Number of Visits 1    Authorization Type [employer] dual    PT Start Time [phone]    PT Stop Time 0921    PT Time Calculation (min) 39 min    Activity Tolerance Patient tolerated treatment well;Patient limited by pain    Behavior During Therapy WFL for tasks assessed/performed          Past Medical History:  Diagnosis Date   Acute cystitis    Acute metabolic encephalopathy    Anemia    Arthritis    Chronic pain    Diabetes mellitus    Fibromyalgia    GERD (gastroesophageal reflux disease)    Hepatitis C    received treatment   History of methicillin resistant staphylococcus aureus (MRSA)    Hypercholesteremia    Hypertension    IBS (irritable bowel syndrome)    Multiple sclerosis exacerbation    Neurogenic bladder    pt denies having to cath herself-says she voids on her own as of 11-21-21   Neuropathy    Obesity    Sacral nerve stimulator present    battery is dead and has not worked in years   Stroke [facility])    pt denies this but pcp has it in their history   Tachycardia    UTI (urinary tract infection)    Vitamin D  deficiency    Past Surgical History:  Procedure Laterality Date   ABDOMINAL HYSTERECTOMY     partial   I & D EXTREMITY Right 12/14/2020   Procedure: IRRIGATION AND DEBRIDEMENT RIGHT LOWER EXTREMITY;  Surgeon: Fidel Rogue, MD;  Location: [facility] OR;  Service: Orthopedics;  Laterality: Right;   JOINT REPLACEMENT     LTK   ORIF ANKLE FRACTURE Left 11/25/2021   Procedure: OPEN REDUCTION INTERNAL FIXATION (ORIF) LEFT ANKLE FRACTURE;  Surgeon: Tobie Franky SQUIBB, DPM;  Location: [facility] ORS;  Service: Orthopedics;  Laterality: Left;  POPLITEAL BLOCK   RADIOLOGY WITH ANESTHESIA N/A 02/18/2021   Procedure: MRI WITH ANESTHESIA CERVICAL SPINE WITH  AND WITHOUT CONTRAST AND BRAIN WITH AND WITHOUT CONTRAST;  Surgeon: Radiologist, Medication, MD;  Location: [facility] OR;  Service: Radiology;  Laterality: N/A;   RADIOLOGY WITH ANESTHESIA N/A 07/07/2021   Procedure: MRI WITH ANESTHESIA;  Surgeon: Radiologist, Medication, MD;  Location: [facility] OR;  Service: Radiology;  Laterality: N/A;   RADIOLOGY WITH ANESTHESIA N/A 08/18/2022   Procedure: MRI BRAIN WITH AND WITHOUT CONTRAST; MRI CERVICAL SPINE WITHE AND WITOUT CONTRAST WITH ANESTHESIA;  Surgeon: Radiologist, Medication, MD;  Location: [facility] OR;  Service: Radiology;  Laterality: N/A;   RADIOLOGY WITH ANESTHESIA N/A 06/26/2023   Procedure: MRI WITH ANESTHESIA;  Surgeon: Radiologist, Medication, MD;  Location: [facility] OR;  Service: Radiology;  Laterality: N/A;   SACRAL NERVE STIMULATOR PLACEMENT     [employer]   SYNDESMOSIS REPAIR Left 11/25/2021   Procedure: SYNDESMOSIS REPAIR;  Surgeon: Tobie Franky SQUIBB, DPM;  Location: [facility] ORS;  Service: Orthopedics;  Laterality: Left;   TUBAL LIGATION     Patient Active Problem List   Diagnosis Date Noted   Chronic bilateral low back pain with bilateral sciatica 03/07/2024   Active secondary progressive multiple sclerosis 03/07/2024   Other fatigue 01/10/2024   Left foot drop 01/10/2024   Anemia 10/31/2021  Bloating 10/31/2021   Chronic hepatitis C (HCC) 10/31/2021   Diarrhea 10/31/2021   Bowel incontinence 10/31/2021   Iron deficiency anemia 10/31/2021   Irritable bowel syndrome with diarrhea 10/31/2021   Screening for viral disease 10/31/2021   Impaired ambulation 07/29/2021   UTI (urinary tract infection) 07/16/2021   Chronic pain 07/06/2021   Dysesthesia 06/12/2021   Pain in both lower extremities 01/14/2021   Acute metabolic encephalopathy 12/10/2020   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) 12/10/2020   Leg wound, right, initial encounter 12/10/2020   Hyperglycemia 12/10/2020   Morbid obesity (HCC) 09/23/2020   Gastroesophageal reflux  disease 09/23/2020   Essential tremor 07/15/2020   Gait disturbance 06/13/2020   Neurogenic bladder 12/19/2019   Urinary retention 12/19/2019   High risk medication use 12/08/2019   Left carpal tunnel syndrome 08/19/2012   Depression with anxiety 08/19/2012   Lumbar spondylosis 03/07/2012   Osteoarthrosis, unspecified whether generalized or localized, lower leg 03/07/2012   Diabetic peripheral neuropathy (HCC) 03/07/2012   SMOKER 06/02/2010   SIMPLE CHRONIC BRONCHITIS 06/02/2010   DYSPNEA 06/02/2010   SKIN RASH 08/05/2009   ACUTE CYSTITIS 05/03/2009   DM (diabetes mellitus), type 2 with complications (HCC) 02/18/2009   INSOMNIA 02/12/2009   Obesity, Class III, BMI 40-49.9 (morbid obesity) (HCC) 11/28/2008   LIVER FUNCTION TESTS, ABNORMAL, HX OF 11/28/2008   DEGENERATION INTERVERTEBRAL DISC SITE UNSPEC 07/02/2008   Other chronic pain 05/22/2008   BLURRED VISION 05/22/2008   ESOPHAGEAL REFLUX 01/10/2008   Multiple sclerosis 12/09/2007   URINARY RETENTION 12/09/2007   TACHYCARDIA 11/17/2007   Essential hypertension 11/08/2007    PCP: Leita Jama Norse, FNP  REFERRING PROVIDER: Charlie Crete, MD  THERAPY DIAG:  Unsteadiness on feet - Plan: PT plan of care cert/re-cert  Abnormal posture - Plan: PT plan of care cert/re-cert  Muscle weakness (generalized) - Plan: PT plan of care cert/re-cert  Difficulty in walking, not elsewhere classified - Plan: PT plan of care cert/re-cert  Rationale for Evaluation and Treatment Rehabilitation  SUBJECTIVE:                                                                                                                                                                                           SUBJECTIVE STATEMENT: Pt presents for a motorized scooter evaluation. She is currently ambulating with a SPC, but is at a very high risk for falls. She has experienced multiple MS flares recently and has developed significant bilateral hand, arm and  leg pain.   PRECAUTIONS: Fall  WEIGHT BEARING RESTRICTIONS No  PLOF:  Independent  PATIENT GOALS: to get a motorized scooter for  in the home         MEDICAL HISTORY:  Primary diagnosis onset: 2008     Medical Diagnosis with ICD-10 code: G41.C1 (ICD-10-CM) - Active secondary progressive multiple sclerosis  R26.9 (ICD-10-CM) - Unspecified abnormalities of gait and mobility     [x] Progressive disease  Relevant future surgeries:     Height: 5'3 Weight: 255lbs Explain recent changes or trends in weight:      History:  Past Medical History:  Diagnosis Date   Acute cystitis    Acute metabolic encephalopathy    Anemia    Arthritis    Chronic pain    Diabetes mellitus    Fibromyalgia    GERD (gastroesophageal reflux disease)    Hepatitis C    received treatment   History of methicillin resistant staphylococcus aureus (MRSA)    Hypercholesteremia    Hypertension    IBS (irritable bowel syndrome)    Multiple sclerosis exacerbation    Neurogenic bladder    pt denies having to cath herself-says she voids on her own as of 11-21-21   Neuropathy    Obesity    Sacral nerve stimulator present    battery is dead and has not worked in years   Stroke [facility])    pt denies this but pcp has it in their history   Tachycardia    UTI (urinary tract infection)    Vitamin D  deficiency        Cardio Status:  Functional Limitations:   [x] Intact  []  Impaired      Respiratory Status:  Functional Limitations:   [x] Intact  [] Impaired   [] SOB [] COPD [] O2 Dependent ______LPM  [] Ventilator Dependent  Resp equip:                                                     Objective Measure(s):   Orthotics:   [] Amputee:                                                             [] Prosthesis:      HOME ENVIRONMENT:  [x] House [] Condo/town home [] Apartment [] Asst living [] LTCF         [] Own  [] Rent   [] Lives alone [x] Lives with others -           family                  Hours without assistance:  fluctuates  [x] Home is accessible to patient                                 Storage of wheelchair:  [x] In home   [] Other Comments:       COMMUNITY :  TRANSPORTATION:  [x] Car [] Retail Banker [] Adapted w/c Lift []  Ambulance [] Other:                     [] Sits in wheelchair during transport   Where is w/c stored during transport?  [] [employer]  []  [employer]  r   [] Self-Driver       Drive while in  Wheelchair [] yes [x] no   Employment and/or school:  Specific requirements pertaining to mobility        Other:  COMMUNICATION:  Verbal Communication  [x] WFL [] receptive [x] WFL [] expressive [] Understandable  [] Difficult to understand  [] non-communicative  Primary Language:____english__________ 2nd:_____________  Communication provided by:[] Patient [] Family [] Caregiver [] Translator   [] Uses an paramedic device     Manufacturer/Model :    MOBILITY/BALANCE:  Sitting Balance  Standing Balance  Transfers  Ambulation   [x] WFL      [] WFL  [] Independent  []  Independent   [] Uses UE for balance in sitting Comments:  [x] Uses UE/device for stability Comments: SPC vs additional person [x]  Min assist  []  Ambulates independently with       device:___________________      []  Mod assist  []  Able to ambulate ______ feet        safely/functionally/independently   []  Min assist  []  Min assist  []  Max assist  [x]  Non-functional ambulator         History/High risk of falls   []  Mod assist  []  Mod assist  []  Dependent  []  Unable to ambulate   []  Max  assist  []  Max assist  Transfer method:[] 1 person [] 2 person [] sliding board [] squat pivot [x] stand pivot [] mechanical patient lift  [] other:   []  Unable  []  Unable    Fall History: # of falls in the past 6 months? 0 # of "near" falls in the past 6 months? A few times a month due to fatigue    CURRENT SEATING / MOBILITY:  Current Mobility Device: [x] None [] Cane/Walker [] Manual [] Dependent [] Dependent w/ Tilt  Scooter  [] Power (type of  control):   Manufacturer:  Model:  Serial #:   Size:  Color:  Age:   Purchased by whom:   Current condition of mobility base:    Current seating system:                                                                       Age of seating system:    Describe posture in present seating system:    Is the current mobility meeting medical necessity?:  [] Yes [] No Describe:    Ability to complete Mobility-Related Activities of Daily Living (MRADL's) with Current Mobility Device:   Move room to room  [] Independent  [x] Min [] Mod [] Max assist  [] Unable  Comments:   Meal prep  [] Independent  [x] Min [] Mod [] Max assist  [] Unable    Feeding  [x] Independent  [] Min [] Mod [] Max assist  [] Unable    Bathing  [] Independent  [x] Min [] Mod [] Max assist  [] Unable    Grooming  [x] Independent  [] Min [] Mod [] Max assist  [] Unable    UE dressing  [x] Independent  [] Min [] Mod [] Max assist  [] Unable    LE dressing  [x] Independent   [] Min [] Mod [] Max assist  [] Unable    Toileting  [x] Independent  [] Min [] Mod [] Max assist  [] Unable    Bowel Mgt: [x]  Continent []  Incontinent []  Accidents []  Diapers []  Colostomy []  Bowel Program:  Bladder Mgt: [x]  Continent []  Incontinent []  Accidents []  Diapers []  Urinal []  Intermittent Cath []  Indwelling Cath []  Supra-pubic Cath     Current Mobility Equipment Trialed/ Ruled Out:    Does not meet mobility needs  due to:    Oneil all boxes that indicate inability to use the specific equipment listed     Meets needs for safe  independent functional  ambulation  / mobility    Risk of  Falling or History of Falls    Enviromental limitations      Cognition    Safety concerns with  physical ability    Decreased / limitations endurance  & strength     Decreased / limitations  motor skills  & coordination    Pain    Pace /  Speed    Cardiac and/or  respiratory condition    Contra - indicated by diagnosis   Cane/Crutches  []   [x]   [x]   []   [x]   [x]   [x]   [x]   [x]   []   []    Walker /  Rollator  []  NA   []   [x]   [x]   []   [x]   [x]   [x]   [x]   [x]   []   []     Manual Wheelchair [id number]:  []  NA  []   []   [x]   []   [x]   [x]   [x]   [x]   [x]   []   []    Manual W/C (K0005) with power assist  []  NA  []   []   []   []   []   []   []   []   []   []   []    Scooter  []  NA  [x]   []   []   []   []   []   []   []   []   []   []    Power Wheelchair: standard joystick  []  NA  []   []   [x]   []   []   []   []   []   []   []   []    Power Wheelchair: alternative controls  []  NA  []   []   []   []   []   []   []   []   []   []   []    Summary:  The least costly alternative for independent functional mobility was found to be:    []  Crutch/Cane  []  Walker []  Manual w/c  []  Manual w/c with power assist   [x]  Scooter   []  Power w/c std joystick   []  Power w/c alternative control        []  Requires dependent care mobility device   Cabin Crew for [employer] skills are adequate for safe mobility equipment operation  [x]   Yes []   No  Patient is willing and motivated to use recommended mobility equipment  [x]   Yes []   No       []  Patient is unable to safely operate mobility equipment independently and requires dependent care equipment Comments:           SENSATION and SKIN ISSUES:  Sensation []  Intact  [x]  Impaired []  Absent []  Hyposensate []  Hypersensate  []  Defensiveness  Location(s) of impairment:    Pressure Relief Method(s):  [x]  Lean side to side to offload (without risk of falling)  [x]   W/C push up (4+ times/hour for 15+ seconds) []  Stand up (without risk of falling)    []  Other: (Describe): Effective pressure relief method(s) above can be performed consistently throughout the day: [] Yes  []  No If not, Why?:  Skin Integrity Risk:       [x]  Low risk           []  Moderate risk            []  High risk  If high risk, explain:   Skin Issues/Skin Integrity  Current skin Issues  []  Yes [x]  No []  Intact  []   Red area   []   Open area  []  Scar tissue  []  At risk from prolonged sitting  Where:  History of Skin Issues  []  Yes [x]  No Where : When: Stage: Hx of skin flap surgeries  []  Yes [x]  No Where:  When:  Pain: [x]  Yes []  No   Pain Location(s): low back, B UE, B LE  Intensity scale: (0-10) : 15/10 on a bad day, average of 5/10 How does pain interfere with mobility and/or MRADLs? -  when her pain flares    MAT EVALUATION:  Neuro-Muscular Status: (Tone, Reflexive, Responses, etc.)     []   Intact   [x]  Spasticity:  []  Hypotonicity  []  Fluctuating  []  Muscle Spasms  []  Poor Righting Reactions/Poor Equilibrium Reactions  []  Primal Reflex(s):    Comments: L LE spasms            COMMENTS:    POSTURE:     Comments:  Pelvis Anterior/Posterior:  [x]  Neutral   []  Posterior  []  Anterior  []  Fixed - No movement []  Tendency away from neutral []  Flexible []  Self-correction []  External correction Obliquity (viewed from front)  [x]  WFL []  R Obliquity []  L Obliquity  []  Fixed - No movement []  Tendency away from neutral []  Flexible []  Self-correction []  External correction Rotation  [x]  WFL []  R anterior []  L anterior  []  Fixed - No movement []  Tendency away from neutral []  Flexible []  Self-correction []  External correction Tonal Influence Pelvis:  [x]  Normal []  Flaccid []  Low tone []  Spasticity []  Dystonia []  Pelvis thrust []  Other:    Trunk Anterior/Posterior:  [x]  WFL []  Thoracic kyphosis []  Lumbar lordosis  []  Fixed - No movement []  Tendency away from neutral []  Flexible []  Self-correction []  External correction  [x]  WFL []  Convex to left  []  Convex to right []  S-curve   []  C-curve []  Multiple curves []  Tendency away from neutral []  Flexible []  Self-correction []  External correction Rotation of shoulders and upper trunk:  [x]  Neutral []  Left-anterior []  Right- anterior []  Fixed- no movement []  Tendency away from neutral []  Flexible []  Self correction []  External correction Tonal influence Trunk:  [x]  Normal []   Flaccid []  Low tone []  Spasticity []  Dystonia []  Other:   Head & Neck  [x]  Functional []  Flexed    []  Extended []  Rotated right  []  Rotated left []  Laterally flexed right []  Laterally flexed left []  Cervical hyperextension   [x]  Good head control []  Adequate head control []  Limited head control []  Absent head control Describe tone/movement of head and neck:     LE MMT:  MMT Right 03/23/2024 Left 03/23/2024  Hip flexion 3+ 3  Hip extension    Hip abduction 4+ 3+  Hip adduction 3 3  Knee flexion 3+ 3  Knee extension 3+ 3  Ankle dorsiflexion 3+ 3+  Ankle plantarflexion     (Blank rows = not tested)  Hip positions:  [x]  Neutral   []  Abducted   []  Adducted  []  Subluxed   []  Dislocated   []  Fixed   []  Tendency away from neutral []  Flexible []  Self-correction []  External correction   Hip Windswept:[x]  Neutral  []  Right    []  Left  []  Subluxed   []  Dislocated   []  Fixed   []  Tendency away from neutral []  Flexible []  Self-correction []  External correction  LE Tone: []  Normal []  Low  tone [x]  Spasticity L LE []  Flaccid []  Dystonia []  Rocks/Extends at hip []  Thrust into knee extension []  Pushes legs downward into footrest   LE Edema: [x]  1+ (Barely detectable impression when finger is pressed into skin) []  2+ (slight indentation. 15 seconds to rebound) []  3+ (deeper indentation. 30 seconds to rebound) []  4+ (>30 seconds to rebound)  UE Measurements:  UPPER EXTREMITY MMT:  MMT Right 03/23/2024 Left 03/23/2024  Shoulder flexion 3+ 3+  Shoulder abduction 3+ 3+  Shoulder adduction    Elbow flexion 3+ 3+  Elbow extension 3+ 3+  Wrist flexion    Wrist extension    Pinch strength    Grip strength ~10lbs ~5lbs  (Blank rows = not tested)  Shoulder Posture:  Right Tendency towards Left  []   Functional []    []   Elevation []    []   Depression []    [x]   Protraction [x]    []   Retraction []    [x]   Internal rotation [x]    []   External rotation []    []    Subluxed []     UE Tone: [x]  Normal []  Flaccid []  Low tone []  Spasticity  []  Dystonia []  Other:    Wrist/Hand: Handedness: [x]  Right   []  Left   []  NA: Comments:  Right  Left  []   WNL []    []   Limitations []    []   Contractures []    []   Fisting []    []   Tremors []    []   Weak grasp []    []   Poor dexterity []    []   Hand movement non functional []    []   Paralysis []         MOBILITY BASE RECOMMENDATIONS and JUSTIFICATION:  MOBILITY BASE  JUSTIFICATION   Manufacturer:    Model:                              Color:  Seat Width:   Seat Depth    []  Manual mobility base (continue below)   [x]  Scooter/POV  []  Power mobility base   Number of hours per day spent in above selected mobility base: ~8-10hours  Typical daily mobility base use Schedule: Patient transfers into her scooter to safely maneuver her home in order to safely complete her MRADLs   [x]  is not a safe, functional ambulator  [x]  limitation prevents from completing a MRADL(s) within a reasonable time frame    [x]  limitation places at high risk of morbidity or mortality secondary to  the attempts to perform a    MRADL(s)  []  limitation prevents accomplishing a MRADL(s) entirely  [x]  provide independent mobility  [x]  equipment is a lifetime medical need  []  walker or cane inadequate  [x]  any type manual wheelchair      inadequate  []  scooter/POV inadequate      []  requires dependent mobility        POWER MOBILITY      [x]  Scooter/POV    [x]  can safely operate   [x]  can safely transfer   [x]  has adequate trunk stability   [x]  cannot functionally propel  manual wheelchair    []  Power mobility base    []  non-ambulatory   []  cannot functionally propel manual wheelchair   []  cannot functionally and safely      operate scooter/POV  []  can safely operate power       wheelchair  []  home is accessible  []  willing to use power wheelchair  Tilt  []  Powered tilt on powered chair  []  Powered tilt on manual  chair  []  Manual tilt on manual chair Comments:  []  change position for pressure      []  elief/cannot weight shift   []  change position against      gravitational force on head and      shoulders   []  decrease pain  []  blood pressure management   []  control autonomic dysreflexia  []  decrease respiratory distress  []  management of spasticity  []  management of low tone  []  facilitate postural control   []  rest periods   []  control edema  []  increase sitting tolerance   []  aid with transfers         []  Upgraded electronics controller/harness    []  Single power (tilt or recline)   []  Expandable    []  Non-expandable plus   []  Multi-power (tilt, recline, power legrest, power seat lift, vertical positioning system, stand)  []  allows input device to communicate with drive motors  []  harness provides necessary connections between the controller, input device, and seat functions     []  needed in order to operate power seat functions through joystick/ input device  []  required for alternative drive controls     []  Enhanced display  []  required to connect all alternative drive controls   []  required for upgraded joystick      (lite-throw, heavy duty, micro)  []  Allows user to see in which mode and drive the wheelchair is set; necessary for alternate controls       []  Upgraded tracking electronics  []  correct tracking when on uneven surfaces makes switch driving more efficient and less fatiguing  []  increase safety when driving  []  increase ability to traverse thresholds    []  Safety / reset / mode switches     Type:    []  Used to change modes and stop the wheelchair when driving     []  Mount for joystick / input device/switches  []  swing away for access or transfers   []  attaches joystick / input device / switches to wheelchair   []  provides for consistent access  []  midline for optimal placement    []  Attendant controlled joystick plus     mount  []  safety  []  long distance driving  []   operation of seat functions  []  compliance with transportation regulations    [x]  Battery  [x]  required to power (power assist / scooter/ power wc / other):   []  Power inverter (24V to 12V)  []  required for ventilator / respiratory equipment / other:                                                 The above equipment has a life- long use expectancy.  Growth and changes in medical and/or functional conditions would be the exceptions.   SUMMARY:  Why mobility device was selected; include why a lower level device is not appropriate:    ASSESSMENT:  CLINICAL IMPRESSION: Patient is a 63 y.o. female who was seen today for physical therapy evaluation for a power scooter. She was diagnosed with Multiple Sclerosis in 2008 and has experienced multiple, debilitating exacerbations. Her most recent exacerbation has left her with burning pain in bilateral arms/hands and from her low back to her distal feet. As such, her safety and  ability to ambulate has significantly declined. This has greatly impacted her ability to safely, efficiently and independently navigate her home to complete her MRADLs. Given her significant sensory changes in her hands and arms, she is unable to effectively propel a manual wheelchair. Even at a hemi-height, she lacks the lower extremity strength, coordination or endurance to foot-propel. Using a mobility device other than a power scooter will compound her debilitating pain rendering her unable to maintain her level of independence within her home. While she has not suffered any falls within the past 6 months, she is at an incredibly high risk of sustaining a fall. She has profoundly limited her mobility so as to achieve no falls as well. Five times Sit to Stand Test (FTSS) Method: Use a straight back chair with a solid seat that is 17-18" high. Ask participant to sit on the chair with arms folded across their chest.   Instructions: "Stand up and sit down as quickly as possible  5 times, keeping your arms folded across your chest."   Measurement: Stop timing when the participant touches the chair in sitting the 5th time.  TIME: 26.16 sec with bilateral upper extremities + CGA.   Cut off scores indicative of increased fall risk: >12 sec CVA, >16 sec PD, >13 sec vestibular (ANPTA Core Set of Outcome Measures for Adults with Neurologic Conditions, 2018). Patient completed the Timed Up and Go test (TUG) in 21.27 seconds with SPC + SBA.  Geriatrics: need for further assessment of fall risk: >= 12 sec; Recurrent falls: > 15 sec; Vestibular Disorders fall risk: > 15 sec; Parkinson's Disease fall risk: > 16 sec ([email or web address], 2023). The patient requires a battery in order to be able to operate her power scooter as well.  A power scooter is a life long medical need for the patient given her progressive diagnosis of Multiple Sclerosis. She suffers from significant pain and weakness in all four extremities. A power scooter will allow her to continue to complete her MRADLs at a safe, efficient and independent level.   OBJECTIVE IMPAIRMENTS Abnormal gait, decreased balance, decreased coordination, decreased endurance, decreased knowledge of condition, decreased knowledge of use of DME, difficulty walking, decreased strength, impaired sensation, impaired tone, impaired UE functional use, postural dysfunction, and pain.   ACTIVITY LIMITATIONS carrying, lifting, standing, squatting, transfers, reach over head, hygiene/grooming, locomotion level, and caring for others  PARTICIPATION LIMITATIONS: meal prep, cleaning, interpersonal relationship, driving, shopping, and community activity  PERSONAL FACTORS Age, Fitness, Past/current experiences, Sex, Social background, Time since onset of injury/illness/exacerbation, and Transportation are also affecting patient's functional outcome.   REHAB POTENTIAL: Fair etiology   CLINICAL DECISION MAKING: Stable/uncomplicated  EVALUATION COMPLEXITY:  High      GOALS: One time visit. No goals established.    PLAN: PT FREQUENCY: one time visit    Delon DELENA Pop, PT Delon DELENA Pop, PT, DPT, CBIS  03/23/2024, 9:45 AM    I concur with the above findings and recommendations of the therapist:  Physician name printed:         Physician's signature:      Date:

## 2024-03-28 ENCOUNTER — Telehealth: Payer: Self-pay | Admitting: Neurology

## 2024-03-28 NOTE — Telephone Encounter (Signed)
 Pt connected to billing so she can make appointment

## 2024-04-03 NOTE — Telephone Encounter (Signed)
 I called and spoke w/ rep/ Ladaisha/[employer]. She notated on account that they should contact [facility] about PA for scooter instead.

## 2024-04-03 NOTE — Telephone Encounter (Signed)
 Called pt to inform her and she stated that the DME did not request a PA what they requested was a order which provider has already sent in. Please advise.

## 2024-04-03 NOTE — Telephone Encounter (Signed)
 Phone room: it was not pt who called originally. You need to call Debbie/[employer] back at number below. They are the ones asking for PA and they need to contact [facility]

## 2024-04-03 NOTE — Telephone Encounter (Signed)
 Debbie from Member services dept of [employer] [phone]) is asking if there is a PA initiated for a scooter for pt, please call.

## 2024-04-03 NOTE — Telephone Encounter (Signed)
 Phone room: please call back. We do not do prior auths for this. It would be the medical equipment company: [facility]. Please ask them to contact them about this

## 2024-09-27 ENCOUNTER — Ambulatory Visit: Admitting: Adult Health
